# Patient Record
Sex: Male | Born: 1975 | Race: Black or African American | Hispanic: No | Smoking: Current every day smoker
Health system: Southern US, Community
[De-identification: ages and names within clinical notes are randomized; demographics above are authoritative.]

## PROBLEM LIST (undated history)

## (undated) ENCOUNTER — Telehealth

## (undated) ENCOUNTER — Ambulatory Visit

## (undated) ENCOUNTER — Encounter

## (undated) ENCOUNTER — Ambulatory Visit: Payer: Medicaid (Managed Care)

## (undated) ENCOUNTER — Ambulatory Visit: Payer: MEDICAID

## (undated) ENCOUNTER — Other Ambulatory Visit

## (undated) ENCOUNTER — Encounter: Attending: Internal Medicine | Primary: Internal Medicine

## (undated) ENCOUNTER — Ambulatory Visit
Payer: PRIVATE HEALTH INSURANCE | Attending: Student in an Organized Health Care Education/Training Program | Primary: Student in an Organized Health Care Education/Training Program

## (undated) ENCOUNTER — Telehealth
Attending: Student in an Organized Health Care Education/Training Program | Primary: Student in an Organized Health Care Education/Training Program

## (undated) ENCOUNTER — Ambulatory Visit: Payer: PRIVATE HEALTH INSURANCE

## (undated) ENCOUNTER — Encounter
Attending: Student in an Organized Health Care Education/Training Program | Primary: Student in an Organized Health Care Education/Training Program

## (undated) ENCOUNTER — Inpatient Hospital Stay: Payer: PRIVATE HEALTH INSURANCE

## (undated) ENCOUNTER — Encounter: Attending: Family | Primary: Family

## (undated) ENCOUNTER — Ambulatory Visit: Payer: MEDICAID | Attending: Plastic and Reconstructive Surgery | Primary: Plastic and Reconstructive Surgery

## (undated) ENCOUNTER — Encounter: Attending: Pharmacist | Primary: Pharmacist

## (undated) ENCOUNTER — Ambulatory Visit
Payer: PRIVATE HEALTH INSURANCE | Attending: Plastic and Reconstructive Surgery | Primary: Plastic and Reconstructive Surgery

## (undated) ENCOUNTER — Inpatient Hospital Stay

## (undated) DIAGNOSIS — K5090 Crohn's disease, unspecified, without complications: Secondary | ICD-10-CM

## (undated) HISTORY — PX: CHOLECYSTECTOMY: SHX55

---

## 2004-11-07 ENCOUNTER — Emergency Department (HOSPITAL_COMMUNITY): Admission: EM | Admit: 2004-11-07 | Discharge: 2004-11-07 | Payer: Self-pay | Admitting: Emergency Medicine

## 2004-11-08 ENCOUNTER — Inpatient Hospital Stay (HOSPITAL_COMMUNITY): Admission: EM | Admit: 2004-11-08 | Discharge: 2004-11-12 | Payer: Self-pay | Admitting: Emergency Medicine

## 2004-11-12 ENCOUNTER — Emergency Department (HOSPITAL_COMMUNITY): Admission: EM | Admit: 2004-11-12 | Discharge: 2004-11-12 | Payer: Self-pay | Admitting: Emergency Medicine

## 2004-11-13 ENCOUNTER — Inpatient Hospital Stay (HOSPITAL_COMMUNITY): Admission: AD | Admit: 2004-11-13 | Discharge: 2004-11-19 | Payer: Self-pay | Admitting: Internal Medicine

## 2004-11-15 ENCOUNTER — Ambulatory Visit: Payer: Self-pay | Admitting: Internal Medicine

## 2004-11-16 ENCOUNTER — Encounter (INDEPENDENT_AMBULATORY_CARE_PROVIDER_SITE_OTHER): Payer: Self-pay | Admitting: *Deleted

## 2005-02-01 ENCOUNTER — Emergency Department (HOSPITAL_COMMUNITY): Admission: EM | Admit: 2005-02-01 | Discharge: 2005-02-02 | Payer: Self-pay | Admitting: Emergency Medicine

## 2005-02-02 ENCOUNTER — Emergency Department (HOSPITAL_COMMUNITY): Admission: EM | Admit: 2005-02-02 | Discharge: 2005-02-02 | Payer: Self-pay | Admitting: Emergency Medicine

## 2005-02-06 ENCOUNTER — Emergency Department (HOSPITAL_COMMUNITY): Admission: EM | Admit: 2005-02-06 | Discharge: 2005-02-06 | Payer: Self-pay | Admitting: Emergency Medicine

## 2005-02-12 ENCOUNTER — Ambulatory Visit: Payer: Self-pay | Admitting: Internal Medicine

## 2005-09-08 ENCOUNTER — Inpatient Hospital Stay (HOSPITAL_COMMUNITY): Admission: EM | Admit: 2005-09-08 | Discharge: 2005-09-13 | Payer: Self-pay | Admitting: Emergency Medicine

## 2005-09-19 ENCOUNTER — Encounter: Payer: Self-pay | Admitting: *Deleted

## 2005-09-20 ENCOUNTER — Ambulatory Visit: Payer: Self-pay | Admitting: Cardiology

## 2005-09-20 ENCOUNTER — Inpatient Hospital Stay (HOSPITAL_COMMUNITY): Admission: EM | Admit: 2005-09-20 | Discharge: 2005-10-13 | Payer: Self-pay | Admitting: Internal Medicine

## 2005-09-27 ENCOUNTER — Ambulatory Visit: Payer: Self-pay | Admitting: Gastroenterology

## 2005-10-08 ENCOUNTER — Encounter: Payer: Self-pay | Admitting: Cardiology

## 2008-07-06 ENCOUNTER — Emergency Department (HOSPITAL_COMMUNITY): Admission: EM | Admit: 2008-07-06 | Discharge: 2008-07-06 | Payer: Self-pay | Admitting: Emergency Medicine

## 2008-08-26 ENCOUNTER — Emergency Department (HOSPITAL_COMMUNITY): Admission: EM | Admit: 2008-08-26 | Discharge: 2008-08-26 | Payer: Self-pay | Admitting: *Deleted

## 2008-09-20 ENCOUNTER — Emergency Department (HOSPITAL_COMMUNITY): Admission: EM | Admit: 2008-09-20 | Discharge: 2008-09-20 | Payer: Self-pay | Admitting: Emergency Medicine

## 2008-09-25 ENCOUNTER — Emergency Department (HOSPITAL_COMMUNITY): Admission: EM | Admit: 2008-09-25 | Discharge: 2008-09-25 | Payer: Self-pay | Admitting: Emergency Medicine

## 2008-10-10 ENCOUNTER — Ambulatory Visit (HOSPITAL_COMMUNITY): Admission: RE | Admit: 2008-10-10 | Discharge: 2008-10-10 | Payer: Self-pay | Admitting: Gastroenterology

## 2008-11-14 ENCOUNTER — Emergency Department (HOSPITAL_COMMUNITY): Admission: EM | Admit: 2008-11-14 | Discharge: 2008-11-14 | Payer: Self-pay | Admitting: Emergency Medicine

## 2008-11-26 ENCOUNTER — Emergency Department (HOSPITAL_COMMUNITY): Admission: EM | Admit: 2008-11-26 | Discharge: 2008-11-26 | Payer: Self-pay | Admitting: Emergency Medicine

## 2008-12-07 ENCOUNTER — Emergency Department (HOSPITAL_COMMUNITY): Admission: EM | Admit: 2008-12-07 | Discharge: 2008-12-07 | Payer: Self-pay | Admitting: Emergency Medicine

## 2008-12-22 ENCOUNTER — Emergency Department (HOSPITAL_COMMUNITY): Admission: EM | Admit: 2008-12-22 | Discharge: 2008-12-22 | Payer: Self-pay | Admitting: Emergency Medicine

## 2009-01-14 ENCOUNTER — Emergency Department (HOSPITAL_COMMUNITY): Admission: EM | Admit: 2009-01-14 | Discharge: 2009-01-14 | Payer: Self-pay | Admitting: Emergency Medicine

## 2009-03-06 ENCOUNTER — Emergency Department (HOSPITAL_COMMUNITY): Admission: EM | Admit: 2009-03-06 | Discharge: 2009-03-06 | Payer: Self-pay | Admitting: Emergency Medicine

## 2010-11-19 LAB — COMPREHENSIVE METABOLIC PANEL
ALT: 15 U/L (ref 0–53)
BUN: 8 mg/dL (ref 6–23)
Chloride: 108 mEq/L (ref 96–112)
Creatinine, Ser: 0.84 mg/dL (ref 0.4–1.5)
Glucose, Bld: 81 mg/dL (ref 70–99)
Sodium: 137 mEq/L (ref 135–145)
Total Protein: 5.6 g/dL — ABNORMAL LOW (ref 6.0–8.3)

## 2010-11-19 LAB — POCT I-STAT, CHEM 8
Creatinine, Ser: 1 mg/dL (ref 0.4–1.5)
Glucose, Bld: 74 mg/dL (ref 70–99)
HCT: 44 % (ref 39.0–52.0)
Hemoglobin: 15 g/dL (ref 13.0–17.0)
TCO2: 14 mmol/L (ref 0–100)

## 2010-11-19 LAB — DIFFERENTIAL
Basophils Absolute: 0 10*3/uL (ref 0.0–0.1)
Lymphocytes Relative: 28 % (ref 12–46)
Monocytes Absolute: 0.5 10*3/uL (ref 0.1–1.0)
Neutrophils Relative %: 64 % (ref 43–77)

## 2010-11-19 LAB — CBC
Hemoglobin: 13.8 g/dL (ref 13.0–17.0)
MCV: 83.6 fL (ref 78.0–100.0)

## 2010-11-19 LAB — CK TOTAL AND CKMB (NOT AT ARMC): Relative Index: 1.4 (ref 0.0–2.5)

## 2010-12-20 NOTE — Op Note (Signed)
NAMEROHIL, Scott Keith               ACCOUNT NO.:  [id number]   MEDICAL RECORD NO.:  [id number]          PATIENT TYPE:  INP   LOCATION:  3307                         FACILITY:  [facility]   PHYSICIAN:  Adolph Pollack, M.D.DATE OF BIRTH:  [birth year]   DATE OF PROCEDURE:  09/29/2005  DATE OF DISCHARGE:                                 OPERATIVE REPORT   PREOP DIAGNOSIS:  Right upper arm abscess.   POSTOP:  Right upper arm abscess.   PROCEDURE:  Complex I&D right upper arm abscess.   SURGEON:  Rosenbower.   ANESTHESIA:  General.   INDICATIONS:  35 year old male had a PIC line in place until it became  infected. He has significant cellulitis and by CT an abscess cavity. It is  very tender to the touch. He now presents for incision and drainage.   TECHNIQUE:  He was brought to the operating room, placed supine on the  operating room. General anesthetic was administered. The right upper  extremity had been marked by me in the holding area. The upper arm was  sterilely prepped and draped. I identified the puncture wound and enlarged  the incision.  I evacuated purulent material, sent it for culture. I then  identified an abscess cavity.  It was intramuscular and drained this and  irrigated it. There was bleeding from the skin edges. I controlled this with  the electrocautery. I then packed the abscess cavity with quarter inch  Iodoform gauze followed by a bulky dry dressing. He tolerated the procedure  well without any apparent complications and was taken to recovery room in  satisfactory condition.      Adolph Pollack, M.D.  Electronically Signed     TJR/MEDQ  D:  09/29/2005  T:  09/30/2005  Job:  [id number]

## 2010-12-20 NOTE — H&P (Signed)
Scott Keith, Scott Keith               ACCOUNT NO.:  [id number]   MEDICAL RECORD NO.:  [id number]          PATIENT TYPE:  EMS   LOCATION:  MAJO                         FACILITY:  [facility]   PHYSICIAN:  Gertha Calkin, M.D.DATE OF BIRTH:  [birth year]   DATE OF ADMISSION:  11/12/2004  DATE OF DISCHARGE:                                HISTORY & PHYSICAL   PRIMARY CARE PHYSICIAN:  Unassigned.   HISTORY OF THE PRESENT ILLNESS:  This is a 35 year old African-American male  with known Crohn;s disease status post colonic resection (see previous H&P  from November 08, 2004).  The patient returns less than 12 hours after being  discharged from [facility] for an admission regarding similar symptoms.  The patient apparently did not tolerate p.o. solids after he went home and  was only tolerating clears at the time of discharge.  He states that his  abdominal pain, nausea and other symptoms that he presented with at [facility]  did resolve when he was discharged and was stable; and, again no other  abdominal pain issues at that time.  However, he states that when he went  home he tried to have some soup and spaghetti, and the abdominal pain  returned, and was not alleviated by his p.o. pain medications; and, he did  have some nausea and a small amount of emesis.  There was no blood in the  emesis.   PAST MEDICAL HISTORY:  1.  Crohn's disease; diagnosed in 1997.  2.  Gastroesophageal reflux disease.  3.  The patient has a colonic resection with some complications back in      September 2005.  He had colonoscopy; this was done in [facility] in      November 2005.  It should be noted that his primary care physician in      [facility] was Dr. Gillie Manners, his GI physician in [facility] was Dr.      Elwyn Lade.  4.  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 Montez Morita, orthopedics, on his last admission.  At      the time of discharge it was deemed that he  could have simple      weightbearing and needed to follow up in the orthopedics office.   MEDICATIONS:  Discharge medications were:  1.  Keflex 500 mg p.o. t.i.d. times six days.  2.  Lotrimin 1% cream to be applied between the toes of his right foot on a      b.i.d. basis.  3.  Prednisone taper 20 mg to be tapered off in 1 days.  4.  TUMS 500 mg tablet three times a day.  5.  Continue his Prevacid on an outpatient basis.  6.  Vicodin tablets, prescribed 40 pills, 1 tablet q.6.   ALLERGIES:  The patient has allergies to MORPHINE  and TORADOL, BOTH CAUSE  ITCHING.   FAMILY HISTORY:  The family history is positive only for hypertension.  No  known Crohn's disease in the family.   SOCIAL HISTORY:  The  patient denies any alcohol.  He now admits he has tried  cocaine approximately six to eight months ago, but only used it for a few  months.  He does smoke approximately a half-a-pack per day for about 10-15  years and is actively smoking.  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  He is divorced and has two children, ages 36 and 59.  He states  that he has been under a lot of stress secondary to his grandmother passing  last week and also he is trying to straighten out his life since he had been  having problems with a cousin who is in [place], [place].  He  recently moved down to [place] to try to straighten up his life.   REVIEW OF SYSTEMS:  The patient does acknowledge depression, anxiety and  lots of stress with new changes in his life.  He is trying to lead a better  life, which is why he has moved down here to [place] to live with his  cousin here.  Otherwise the review of systems is negative, except per HPI.   PHYSICAL EXAMINATION:  VITAL SIGNS:  Temperature 100.3, blood pressure  124/47, pulse of 105, respirations 19, and 97% on room air.  HEENT:  The HEENT is remarkable only for dry mucous membranes, otherwise  pupils equal, round and react to  light.  Extraocular movements are intact.  Oropharynx is clear with no exudate and no erythema.  NECK:  The neck is supple without JVP.  No masses.  No bruits.  HEART:  Cardiovascular is regular rate and rhythm.  No murmurs, rubs or  gallops.  CHEST:  The chest is clear to auscultation bilaterally with good air  movement.  ABDOMEN:  The abdomen has a well healed scar, is diffusely tender, but no  rebound, no guarding, no rigidity.  Bowel sounds are present.  EXTREMITIES:  Swelling of the right foot had diminished significantly.  There is still tenderness.  Otherwise no clubbing or cyanosis.  NEUROLOGIC:  The patient is alert and oriented times three.  Cranial nerves  without deficits.  Is strength is 5/5 and symmetric.  Sensation is intact  and symmetric.  PSYCHIATRIC:  The patient admits to be depressed.  He has crying spells.   LABORATORY DATA:  White count of 9.7, hemoglobin 13.3 and platelets of  241,000.  Sodium is 138, potassium 2.8, chloride 103, bicarb 24, glucose  137, BUN 10, and creatinine 1.1.  LFTs are normal.  Lipase is 39.  UA is  negative, except for calcium oxalate crystals.  Urine drug screen is  positive only for benzos.   ASSESSMENT AND PLAN:  1.  Abdominal pain/nausea.  2.  Crohn's disease.  3.  Hypokalemia.  4.  Depression and anxiety.  5.  Tobacco abuse.  6.  History of cocaine use.  7.  Deep venous thrombosis and gastrointestinal prophylaxis.   PLAN:  1.  Admit.  2.  We will transfer from [facility] to [facility] to maintain continuity      with the physician at [facility] who just discharged him.  3.  We will provide IV pain control and antiemetics.  He had a recent CT      from his recent admission that did not show any acute colitis.  4.  At this time we will replete potassium IV as well as p.o. and check a      mag level in the morning.  5.  I offered him a nicotine patch and he is willing.  6.  Also we will provide antidepressant and anxiety  medications. 7.  We will defer GI consult to the physician at [facility] who can assess the      severity of his abdominal pain relative      to discharge status.  8.  We will also provide him with p.r.n. sleep medication since a lot of      this stress has not allowed him to obtain a good night's rest.      JD/MEDQ  D:  11/13/2004  T:  11/13/2004  Job:  [id number]   cc:   [facility]   Dr. Gillie Manners   Dr. Elwyn Lade

## 2010-12-20 NOTE — Discharge Summary (Signed)
NAMEVELDON, Scott Keith               ACCOUNT NO.:  [id number]   MEDICAL RECORD NO.:  [id number]          PATIENT TYPE:  INP   LOCATION:  6727                         FACILITY:  [facility]   PHYSICIAN:  Nelma Rothman, MD   DATE OF BIRTH:  1976-04-12   DATE OF ADMISSION:  09/20/2005  DATE OF DISCHARGE:  10/13/2005                                 DISCHARGE SUMMARY   PRIMARY CARE PHYSICIAN:  Dr. Gracelyn Nurse   DISCHARGE DIAGNOSES:  1.  Right upper extremity deep venous thrombosis, peripherally-inserted      central catheter associated.  2.  Right upper extremity methicillin-resistant Staphylococcus aureus      cellulitis/abscess.  3.  Crohn's disease flare.   Please see previously-dictated discharge summaries by Dr. Isidor Holts on  September 28, 2005, and Dr. Lonia Blood on October 06, 2005, for details of his  hospital course.   HOSPITAL COURSE:  #1 - RIGHT UPPER EXTREMITY DEEP VENOUS THROMBOSIS. This  was associated with a right upper extremity peripherally-inserted central  catheter. This has subsequently been removed. He has remained on Lovenox  while in the hospital since that time. There was an initial attempt made at  starting him on Coumadin for this, but it was very difficult to achieve a  therapeutic INR with very high doses of Coumadin. Therefore, given a history  of noncompliance in the past and difficulty in achieving a therapeutic INR,  I have elected to discharge the patient home on Lovenox injections twice  daily. This could be readdressed by his regular doctor when he sees him next  week and consider conversion to Coumadin at that time if he feels  appropriate.   #2 - METHICILLIN-RESISTANT STAPHYLOCOCCUS AUREUS RIGHT UPPER EXTREMITY  ABSCESS AND CELLULITIS. He did develop sepsis secondary to this which has  since resolved. He did require some stress-dose steroids for a short time.  He has now completed a 14-day course of IV vancomycin as well as a 7-day  course of oral  rifampin. He has done quite well with this. The swelling and  redness in his arm have decreased dramatically. He has been afebrile for  greater than 1 week.   #3 - CROHN'S DISEASE FLARE. This was his initial presenting problem when  admitted to the hospital. He was seen in consultation by Dr. Elnoria Howard who  recommended he remain on 40 mg of prednisone for approximately 1 month. He  is approximately 2 weeks into that month since he has been here in the  hospital, and so at this point I would continue 40 mg of prednisone for an  additional 2 weeks as per Dr. Haywood Pao recommendations. In that interim, the  patient states that his primary care physician was planning on referring him  to a gastroenterologist closer to home and he wishes to follow up there.  This would be appropriate, but I have reminded the patient that he needs to  stay on 40 mg of prednisone for an additional 2 weeks and then this needs to  be tapered by his physician at that time. I have reminded him that he should  not stop taking the prednisone without instruction by his doctor as this can  be life-threatening. As he plans to stay with his mother in [place],  he will need to establish care with a primary gastroenterologist there.   #4 - DISPOSITION. The patient was previously homeless. At this time he plans  to return to live with his mother in [place], [place] and will  follow up with Dr. Gracelyn Nurse at [facility]. I have stressed to him the  importance of close follow-up and that not treating his DVT or stopping the  prednisone abruptly can both be life-threatening. He understands this and  states that he will follow up with his primary care doctor next week. I have  called in his prescriptions to the [facility] in [place] where  his Medicaid is set up.   DISCHARGE MEDICATIONS:  1.  Lovenox 70 mg subcu b.i.d.  2.  Pentasa 1000 mg p.o. b.i.d.  3.  Prednisone 40 mg p.o. daily until instructed to  taper by your regular      physician.   DISCHARGE INSTRUCTIONS:  Mr. Lama was instructed that he needs to see his  regular doctor this coming week without exception. He also needs to  establish care with a regular gastroenterologist near [facility] if he  is to reside there for any prolonged period of time. He is reminded that he  needs to take all of his medications as prescribed without missing any  doses, and that missing any doses can be life-threatening.      Nelma Rothman, MD  Electronically Signed     RAR/MEDQ  D:  10/12/2005  T:  10/13/2005  Job:  [id number]   cc:   Chucky May, MD  Telephone # [phone]  Aulander Yorklyn  Please FAX copy if you can find FAX #

## 2010-12-20 NOTE — Consult Note (Signed)
Scott Keith, Scott Keith               ACCOUNT NO.:  [id number]   MEDICAL RECORD NO.:  [id number]          PATIENT TYPE:  INP   LOCATION:  3307                         FACILITY:  [facility]   PHYSICIAN:  Anselm Pancoast. Weatherly, M.D.DATE OF BIRTH:  [birth year]   DATE OF CONSULTATION:  09/29/2005  DATE OF DISCHARGE:                                   CONSULTATION   REQUESTING PHYSICIAN:  Dr. Lavera Guise   REASON FOR CONSULTATION:  Cellulitis and DVT in the right upper extremity,  rule out lymphadenitis.   HISTORY OF PRESENT ILLNESS:  Mr. Witczak is a 35 year old male patient  admitted on September 19, 2005 for Crohn's flare.  He required a PICC line  for IV access, has been on IV steroids for treatment of Crohn's.  The PICC  line was inserted on September 22, 2005.  On September 27, 2005 patient  inadvertently pulled on the PICC line and subsequently developed swelling  and redness in that area.  Venous Dopplers later showed a DVT.  The PICC  line was discontinued on September 27, 2005.  Patient has subsequently  developed increased swelling, erythema, and significant pain in the arm.  He  is unable to move his arm due to the swelling and the pain.  He has  developed tiny areas of purulent drainage and superficial vesicular  formation over the upper part of the affected limb where the PICC line was  in place in the right upper extremity.  His fever has spiked as high as 104  degrees.  He has been on IV vancomycin, but at this point has no IV access.  The PICC line is planned for insertion later today.  CT of the right upper  extremity has also been ordered to rule out any loculated abscess and to  clarify if there is any lymphadenitis.   REVIEW OF SYSTEMS:  As per history of present illness.   FAMILY HISTORY:  Noncontributory.   SOCIAL HISTORY:  He is currently homeless.  He has family in [facility] and  plans to return there after discharge.  He denies using tobacco at times and  other times admits to  smoking less than a pack of cigarettes a day.  He has  also been positive for marijuana on previous admissions.  Unclear if he  drinks social alcohol.  Patient denies.   PAST MEDICAL HISTORY:  1.  Crohn's disease x3 years.  2.  History of multiple recurrent DVTs.  He has had these in the subclavian      vein and the bilateral iliac veins and is apparently on chronic Lovenox      at home.  3.  GERD.  4.  Right foot cellulitis in 2006.  5.  Medication noncompliance.   PAST SURGICAL HISTORY:  1.  Open cholecystectomy.  2.  Colectomy due to Crohn's issues in 2006.  He was also left with an open      wound that took several months to heal.   ALLERGIES:  MORPHINE and TORADOL.   CURRENT MEDICATIONS:  Nicotine patch, Pentasa, Protonix, prednisone, Lovenox  q.12h., Avelox, Dilaudid  IV, vancomycin IV, Zosyn IV, multiple p.r.n.  medications.   PHYSICAL EXAMINATION:  GENERAL:  Pleasant male somewhat toxic-appearing  complaining of significant right upper extremity pain.  VITAL SIGNS:  Temperature 103, blood pressure 110/61, pulse 138,  respirations 18.  HEENT:  Sclerae are mildly injected.  NECK:  Supple.  No adenopathy.  NEUROLOGIC:  Patient is alert and oriented x3, moving all extremities x4.  CHEST:  Lung sounds are clear bilaterally.  He is on room air.  CARDIAC:  S1, S2.  He is tachycardic.  Rates up into the 130s.  No JVD.  ABDOMEN:  Soft, nontender, nondistended without hepatosplenomegaly, masses,  or bruits.  He has a large scar in the mid abdomen, crater-like with the  umbilicus post surgical deviated to the left portion of abdomen.  He also  has a large linear scar in the right upper quadrant.  EXTREMITIES:  The right upper extremity has 3+ edema, is exquisitely tender  to touch.  The edema extends from the shoulder all the way down towards the  wrist area.  Distal pulses intact.  There is erythema present down through  the forearm.  There are several superficial vesicular areas  with clear  fluid.  There are several pin point areas of purulent fluid.  Areas so  tender unable to determine if I can express fluid out of these due to pain  issues.   LABORATORY AND DIAGNOSTICS:  White count 13,800, hematocrit 14.9, platelets  253,000.  Blood cultures are pending.  Sodium 140, potassium 3.8, glucose  118, CO2 23, BUN 8, creatinine 0.8.   Diagnostics:  Chest x-ray was done today, shows soft tissue swelling in the  right axilla and right upper extremity, otherwise a normal chest x-ray.   IMPRESSION:  1.  Cellulitis and possible lymphadenitis in the right upper extremity with      significant edema, rule out impending compartment syndrome.  2.  Fever and sepsis.  3.  Tachycardia secondary to sepsis.  4.  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  5.  History of Crohn's with recent flare, currently compensated.      Immunosuppressed on prednisone.   PLAN:  1.  Continue vancomycin IV at this time along with Zosyn.  Consider changing      Zosyn to Tygacil, follow up on wound and blood cultures.  2.  Hold Lovenox at the present time in anticipation of potential OR.  Will      need to follow up on the CT scan.  There is some concern that patient      may need fasciotomy if symptoms do not improve within the next 24-48      hours.  3.  Internal jugular IV access was inserted emergently per Dr. Zachery Dakins at      the bedside to begin fluid resuscitation due to patient's febrile state      and tachycardia and need to continue IV antibiotics as soon as      possible.  No peripheral access available.  4.  Will go ahead and put the patient on PCA Dilaudid for pain and we will      reevaluate other symptoms in the morning.      Allison L. Rennis Harding, N.P.    ______________________________  Anselm Pancoast. Zachery Dakins, M.D.    ALE/MEDQ  D:  09/30/2005  T:  09/30/2005  Job:  [id number]

## 2010-12-20 NOTE — Consult Note (Signed)
NAMESCOTTIE, Scott Keith               ACCOUNT NO.:  [id number]   MEDICAL RECORD NO.:  [id number]          PATIENT TYPE:  INP   LOCATION:  0470                         [facility]AC[facility]   PHYSICIAN:  Myrtie Neither, MD      DATE OF BIRTH:  [birth year]   DATE OF CONSULTATION:  11/11/2004  DATE OF DISCHARGE:                                   CONSULTATION   REFERRING PHYSICIAN:  [facility], Dr. Read Drivers.   REASON FOR CONSULTATION:  Multiple stress fractures, right foot.   HISTORY OF PRESENT ILLNESS:  This is a 35 year old male admitted for Crohn's  disease with long term history of Crohn's and use of prednisone.  The  patient had right foot surgery done by podiatrist for plantar callosities  underneath the metatarsal heads two months ago.  The patient has not been  back for his follow-up.  The patient has had pain and swelling involving the  right foot recently over the past two weeks.  The patient denied any fever,  chills, or night sweats.  The patient points to mid foot area as the  majority of the pain.  No recent trauma.   PAST MEDICAL HISTORY:  1.  Crohn's disease.  2.  Gastroesophageal reflux disease.  3.  The patient had history of colon resection in September 2005 and      November 2005.   MEDICATIONS:  1.  Prednisone 20 mg daily.  2.  Prevacid 40 mg daily.   FAMILY HISTORY:  High blood pressure.   SOCIAL HISTORY:  The patient smokes half a pack of cigarettes, denies use of  alcohol or illegal drugs.   REVIEW OF SYSTEMS:  The patient states he has had recent symptoms from his  Crohn's disease and GI discomfort over the past few days.  No cardiac or  respiratory symptoms.   PHYSICAL EXAMINATION:  VITAL SIGNS:  Temperature 98.5, blood pressure  115/72, pulse 88, respirations 20.  EXTREMITIES:  Left foot, post surgical scar, dorsal aspect at the base of  both first and fifth metatarsal heads.  Also ulcers and scars over the PIP  joints of the second, third, and fourth  toes.  Soft tissue swelling and  thickness around the mid foot, tenderness in mid foot and metatarsal area.  Pain on dorsiflexion of the foot as well as inversion.  No plantar  callosities about the first and fifth metatarsal heads.  No increased warmth  or redness, no discoloration other than ulcers or scars.  Pulses are intact.  Plain x-rays revealed old surgical sites with resection of phalangeal heads  of the second, third, and fourth toes.  Some osteopenic bone.  MRI  demonstrates stress fractures involving at the base of first, third, and  fourth metatarsals as well as second metatarsal.  There are also stress  fractures involving distal cuboid and distal aspect of the cuneiforms.  Fractures are nondisplaced.   IMPRESSION:  1.  Osteopenic bone, left foot, with multiple stress fractures of the      metatarsals, cuneiform cuboid bones of the left foot.  The patient's  recent laboratory does demonstrate elevated blood sugar of 204, 147.      Calcium is low at 8.3.  CBC demonstrates white cell count of 12.2, H&H      12.2 and 36.8, differential 92% neutrophils.  2.  Multiple stress fractures, osteopenic bone, rule out secondary to      steroid use, rule out parathyroid disorder.  Presently does not appear      to be an infectious process.   RECOMMENDATIONS:  Sedimentation rate, calcium supplement.  Cam walker with  partial weight bearing on the right side with the use of crutches.  Treatment for diabetes mellitus.  Will follow the patient after discharge in  the office in two week period.      AC/MEDQ  D:  11/11/2004  T:  11/11/2004  Job:  [id number]

## 2010-12-20 NOTE — H&P (Signed)
Scott Keith, Scott Keith               ACCOUNT NO.:  [id number]   MEDICAL RECORD NO.:  [id number]          PATIENT TYPE:  INP   LOCATION:  5705                         FACILITY:  [facility]   PHYSICIAN:  Lonia Blood, M.D.      DATE OF BIRTH:  02-20-1976   DATE OF ADMISSION:  09/08/2005  DATE OF DISCHARGE:                                HISTORY & PHYSICAL   PRIMARY CARE PHYSICIAN:  The patient is unassigned. His primary care doctor  is Dr. Gillie Manners at [facility], [place] [place].   PRESENTING COMPLAINT:  Intractable nausea and vomiting.   HISTORY OF PRESENT ILLNESS:  The patient is a 35 year old guy who primarily  lives in [place] but comes to [place] on and off. The patient  apparently came down 3 days ago from [place]. He has been staying at the  shelter here. He says he has been doing fine and taking all his medications  regularly until this morning, when he started feeling sick, nauseated, and  then vomited. That continued throughout the day and was unable to keep  anything down. Hence, he came to the emergency room. He also complains of  some mild abdominal pain. 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 He was noncompliant with Coumadin;  hence, he was placed on the Lovenox daily. 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 He  was subsequently being admitted for further management and treatment.   PAST MEDICAL HISTORY:  Significant for Crohn's disease status post colectomy  in 2006. The patient also had osteopenic fracture, medication noncompliance.  History of subclavian vein thrombosis. The patient has also GERD. The  patient has history of right foot cellulitis back in June 2006.   CURRENT MEDICATIONS  PER PATIENT:  1.  Pentasa 250 mg t.i.d.  2.  Prevacid 30 mg daily.  3.  Lovenox 30 mg subcutaneous daily.   ALLERGIES:  The patient is allergic to MORPHINE SULFATE and TORADOL.   SOCIAL HISTORY:  The patient currently lives here in the shelter. His  parents live close to [facility]. He shuttles between [place] and  [place]. He is unemployed. Denied any tobacco or alcohol use at the  moment. The patient last here admitted to using some cocaine at the time.  Also admitted to smoking about a half a pack per day for 10-15 years. 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 The patient has been  married before but divorced. He had two children that are currently age 48  and 29. He has been under a lot of stress recently. Most of his family live  around [facility].   FAMILY HISTORY:  Significant for hypertension. The patient denied any  history of Crohn's disease or any abdominal malignancy in his family.   REVIEW OF SYSTEMS:  Ten-point review of systems  was performed and they are  essentially per HPI.   PHYSICAL EXAMINATION:  VITAL SIGNS:  Temperature of 98.9, blood pressure  112/72, pulse 96, respiratory rate 16, saturations 98% room air.  GENERAL:  He is alert and oriented, in no acute distress.  HEENT:  PERRL, EOMI.  NECK:  Supple. No JVD, no lymphadenopathy.  RESPIRATORY:  The patient has good air entry bilaterally. No wheezes or  rales.  CARDIOVASCULAR:  He has regular rate and rhythm.  ABDOMEN:  Soft with multiple scars, what appeared to be an abdominal wall  hernia. It is nontender abdomen with positive bowel sounds.  EXTREMITIES:  Show no edema, cyanosis, or clubbing.   LABORATORY DATA:  Showed a white count of 5.9, hemoglobin is 12.1, platelet  count 285, with MCV of 75.8. Sodium 140, potassium 3.6, chloride 112, CO2  24, glucose 87, BUN 6, creatinine 0.9, his calcium is 8.4, total protein  5.9, albumin 3.0, AST 11, ALT less than 8, alkaline phosphatase  115, total  bilirubin 0.4, amylase 74, lipase 27. His UA shows small leukocyte esterase,  wbc's 3-6, rare bacteria. His PT is 13.0, INR 1.0, PTT 33. He has a CT  abdomen and pelvis that showed thrombus in the bilateral iliac veins and  lower IVC with extensive network of venous collaterals indicating at least  some degree of chronicity; however, the findings were new since April 2006.  There are also no other findings of significance in the abdomen. In the  pelvis there are some venous collaterals in the soft tissues. Also bilateral  iliac and common femoral venous thrombus. There appears to be a tiny air  bubble in the right common femoral vein in an area where there is thrombus.  It is questionable whether this could be infected but clinically there is no  correlation. There is a recommendation for the need for venous Doppler.   ASSESSMENT:  This is a 35 year old extremely noncompliant patient who  currently lives in the shelter here in [place] coming in with intractable  nausea/vomiting. The differentials are vast. Could be drug related,  especially with history of polysubstance abuse and living in the shelter,  that could be a big possibility. It could also be secondary to his Crohn's  which could be flaring, although the CT abdomen did not indicate anything  suggestive of Crohn's flare. The patient has gotten better with some  Phenergan in the ED and is ready to eat. The patient could also be  malingering, having been the fact that he is living in the shelter and  probably not able to get adequate food.   PLAN:  1.  Nausea and vomiting. Will admit the patient and treat him      symptomatically. Will continue his medications for Crohn's and also put      him back on prednisone to taper down. I will give Phenergan plus or      minus Zofran, depending on his symptoms, to control his nausea and     vomiting. Also, hydrate him gently. Once the patient's nausea and      vomiting has  completely disappeared, will advance his diet to regular.      Once he takes regularly will schedule him to follow up with the      gastroenterologist - in this case, [facility] whom he saw      the last time.  2.  History of Crohn's. Again, I am not sure the patient is having any flare  at this point. There is nothing to indicate that. However, I will      continue with his Pentasa and also put him on prednisone. I will put him      on low dose since there is no evidence that he is having an acute flare.  3.  Bilateral iliac vein thrombosis. Will get venous Doppler on the patient.      In the meantime, however, I will start him on full anticoagulation. I      understand the patient was not compliant with Coumadin; hence, he has      been on Lovenox. As such, I will not start Coumadin right now. The      patient may have had this thrombosis at the same      time he had the subclavian one from the look of things. He may just need      to continue on Lovenox as he has taken before prior to discharge.  4.  GERD. I will continue his PPI that the patient was taking prior to      admission.      Lonia Blood, M.D.  Electronically Signed     LG/MEDQ  D:  09/08/2005  T:  09/09/2005  Job:  [id number]

## 2010-12-20 NOTE — Discharge Summary (Signed)
[name], Scott Keith               ACCOUNT NO.:  [id number]   MEDICAL RECORD NO.:  [id number]          PATIENT TYPE:  INP   LOCATION:  0470                         FACILITY:  [facility]   PHYSICIAN:  Isidor Holts, M.D.  DATE OF BIRTH:  Dec 24, 1975   DATE OF PROCEDURE:  DATE OF DISCHARGE:  11/12/2004                      STAT - MUST CHANGE TO CORRECT WORK TYPE   DISCHARGE SUMMARY   PMD:  Dr. Gillie Manners, [facility].  Otherwise unassigned.   GASTROENTEROLOGIST:  Dr. Elwyn Lade, [facility]   DISCHARGE DIAGNOSES:  1.  Acute gastritis.  2.  Cellulitis right foot.  3.  Osteoporotic stress fractures right foot.  4.  History of Crohn's disease.  5.  Gastroesophageal reflux disease.  6.  Tinea interdigitalis.   DISCHARGE MEDICATIONS:  1.  Keflex 500 mg p.o. q.i.d. for six days.  2.  Lotrimin 1% cream topically between toes of right foot b.i.d.  3.  Prednisone taper, i.e., 20 mg p.o. daily for one week, then 10 mg p.o.      daily for three days, then 5 mg p.o. daily for three days, then 2.5 mg      p.o. for three days, then stop.  4.  Tums 500 mg p.o. t.i.d. (over the counter).  5.  Continue pre-admission Prevacid.  6.  Vicodin (5/500) one p.o. p.r.n. q.6 h. for pain, 40 pills dispensed.   PROCEDURES:  1.  Abdominal/pelvic CT scan dated November 07, 2004.  This showed postsurgical      change involving the abdomen, but no evidence for acute inflammatory      process such as recurrent Crohn's disease.  Incidental finding of      multiple large gallstones in the gallbladder.  No acute pelvic findings.      No inflammatory bowel disease is demonstrated.  2.  X-ray right foot, dated November 12, 2004.  This showed osteoporosis.      Prior surgery.  Extensive soft tissue swelling, without acute bony      abnormality.  3.  MRI right foot, dated November 10, 2004.  This showed stress fractures of      bases of the first through fourth metatarsals.  No displacement.      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   CONSULTATIONS:  Dr. Myrtie Neither, orthopedic surgeon.   ADMISSION HISTORY:  As in H&P notes of November 08, 2004.  However, in brief,  this is a 35 year old, African-American male, with known history of Crohn's  disease diagnosed in 1997, status post colonic resection September 2005,  status post recent colonoscopy November 2005, treated intermittently with  steroids, tapering course, during acute exacerbations.  The patient has been  on prednisone 20 mg p.o. daily for a few weeks now.  Presenting on this  occasion with a one-day history of diffuse abdominal pain, nausea, and  vomiting.  Apparently had arrived at [facility] from [facility] to move in  with his cousin.  Denied diarrhea, hematemesis, or melenic stools.  In  addition, has a three-day history of right foot swelling, tenderness, and  pain.  The patient was admitted for further evaluation, investigation,  and  management.   CLINICAL COURSE:  1.  Acute gastritis.  The patient presents with a one-day history of diffuse      abdominal pain, nausea, and vomiting.  Denies any culprit foods.  Denies      any clustering of cases, or febrile illness.  Abdominal CT scan dated      November 07, 2004 showed no evidence of active Crohn's disease, although      there were incidental findings of multiple gallstones.  The patient was      managed with intravenous fluid hydration, antiemetics, proton pump      inhibitors, and analgesics, with good clinical response.  He was kept      n.p.o. initially.  Diet was broadened to clear fluids, then general      fluid diet, then subsequently broadened to regular diet.  The patient      was able to in the main, tolerate this, although he admitted to      occasional vomiting and nausea.  It is doubtful exactly how severe the      patient's episodes of nausea and vomiting were, since through the course      of his hospital stay, he on several occasions was found in the  cafeteria      helping himself to snacks, etc., in clear violation of prescribed      dietary regimen.   1.  Cellulitis right foot.  The patient presented with a three-day history      of right foot swelling and tenderness.  Physical examination revealed      increased local temperature and extreme tenderness, as well as some      duskiness.  He was commenced on antibiotics, initially intravenous      Ancef.  Subsequently switched to p.o. Keflex, with good clinical      response, resulting in diminution of swelling and local temperature.  X-      ray of the affected foot on November 08, 2004 showed osteoporosis, but no      acute bony changes.  Subsequent MRI of the affected foot on November 10, 2004 showed stress fractures affecting the bases of the first through      fourth digits which were deemed to be osteoporotic in nature.  The      patient is known to be on long-term intermittent steroid treatment.  It      is clear that this is the culprit.  Orthopedic consultation was called.      It was kindly provided by Dr. Myrtie Neither.  The patient now has a cast      and has been commenced on analgesics orally for this.  He has been      advised to only partially weight bear and was supplied with crutches.      He is scheduled to follow up with Dr. Montez Morita on an outpatient basis.  In      the meantime, he has also been commenced on Tums 500 mg p.o. t.i.d. for      his osteoporosis, and he is being tapered off steroids.   1.  Tinea interdigitalis.  This was an incidental finding on physical      examination which was felt to have been contributory to the patient's      right foot cellulitis.  He is being managed with topical antifungal      medication.   1.  History of Crohn's disease.  Does not appear in acute exacerbation,      during this admission, however, it is felt that in the future it will     serve him well to establish care with a gastroenterologist in this      locality for  follow-up.   DISPOSITION:  The patient is discharged in satisfactory condition on November 12, 2004.   PAIN MANAGEMENT:  As in discharge medications.   ACTIVITY:  Partial weight-bearing.   DIET:  No restrictions.   WOUND CARE:  Not applicable.   SPECIAL INSTRUCTIONS:  The patient is to follow up with Dr. Myrtie Neither in  orthopedics in two weeks.  He has been supplied with the telephone number,  [phone].  Also, he is to establish PMD with [facility], telephone  number has been supplied, and has been instructed to do so within one week.  All this has been communicated to the patient, and he has verbalized  understanding.      CO/MEDQ  D:  11/13/2004  T:  11/13/2004  Job:  [id number]   cc:   Myrtie Neither, MD  [address] [place]  [place] [address]  Fax: [phone]   Arvid Right, M.D.  [facility]

## 2010-12-20 NOTE — Discharge Summary (Signed)
NAMEWILFRIDO, Scott Keith               ACCOUNT NO.:  [id number]   MEDICAL RECORD NO.:  [id number]          PATIENT TYPE:  INP   LOCATION:  6727                         FACILITY:  [facility]   PHYSICIAN:  Lonia Blood, M.D.       DATE OF BIRTH:  1975-12-22   DATE OF ADMISSION:  09/20/2005  DATE OF DISCHARGE:                                 DISCHARGE SUMMARY   INTERIM DISCHARGE SUMMARY:   DIAGNOSES:  For a complete list of diagnoses, please refer to the previously  dictated discharge summary done on September 28, 2005. Please add to those  the following:  1.  Right arm complex methicillin-resistant Staphylococcus aureus abscess      with septic phlebitis and methicillin-resistant Staphylococcus aureus      bacteremia.  2.  Sepsis, resolved.  3.  Relative adrenal insufficiency, resolving.  4.  Hypokalemia, resolved.  5.  Acute right internal jugular and subclavian deep venous thromboses.  6.  Iron deficiency anemia.  7.  Crohn disease.   Medications will be dictated at the time of the actual discharge of the  patient.   CONSULTATION:  September 29, 2005 Anselm Pancoast. Zachery Dakins, M.D. from [facility] had seen the patient in consultation.   PROCEDURES:  1.  September 29, 2005 - Mr. Franko underwent a right upper arm abscess      incision and drainage by Dr. . Rosenbower.  2.  September 29, 2005 - The patient had a computed tomographic scan of right      upper extremity showing an abscess cavity in the right upper extremity.  3.  September 29, 2005 - The patient underwent an ultrasound-guided left      upper extremity double lumen peripheral intravenous catheter placement      by Dr. Bonnielee Haff. The patient had a 27 cm double lumen #6 [place] power      injectable intravenous catheter with the tip in the left axillary vein.   HOSPITAL COURSE:  For hospital course covering the period from September 20, 2005, until September 28, 2005, refer to the dictated discharge summary done  by Dr.  Brien Few.   For hospital course from September 29, 2005, until October 05, 2005:  Problem #1: Sepsis from a right upper extremity abscess with septic  phlebitis and MRSA bacteremia. Mr. Vetsch was transferred to the step-down  unit. He had aggressive intravenous fluid hydration, empiric antibiotics  with clindamycin, vancomycin and stress dose steroids. He also underwent  incision and drainage of the right upper quadrant abscess. He had been  making a steady recovery throughout this period of time. At the time of my  dictation, he had 1 more week of intravenous antibiotics to complete and 4  more days of oral rifampin. The plan was to continue anticoagulation with  Lovenox and to switch him to chronic Coumadin for at least 4 months.  Throughout this period of time, Mr. Morelos' febrile curve and leukocytosis  resolved.  Problem #2: Relative adrenal insufficiency secondary to this patient being  on chronic steroids for his Crohn's and having an acute infectious  process.  Mr. Dingley responded well with stress dose steroids. He was tapered down at  the time of my dictation. His doses of hydrocortisone were 25 mg  intravenously twice a day.  Problem #3: Crohn disease: The patient was initially admitted with a flare  of Crohn disease and was seen in consultation by the gastroenterology  service. The patient was advised to take mesalamine on a daily basis, and  also with stress dose steroids he was receiving for the infection, his  Crohn's remained pretty quiescent during this period of hospitalization.  Problem #4: Microcytic anemia, mostly secondary to iron deficiency from  chronic Crohn disease. The patient cannot take any oral iron right now due  to his acute infectious process, but this should be undertaken at the time  of the actual discharge and resolution of the infectious process.  Problem #5: History of DVT. Mr. Spong seemed to be at a very high risk for  sustaining deep venous thrombosis. 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 At this point in time, I will  advocate anticoagulation until he is seen by his primary care physicians in  [place], [place].      Lonia Blood, M.D.  Electronically Signed     SL/MEDQ  D:  10/06/2005  T:  10/06/2005  Job:  [id number]

## 2010-12-20 NOTE — H&P (Signed)
Scott Keith, Scott Keith               ACCOUNT NO.:  [id number]   MEDICAL RECORD NO.:  [id number]          PATIENT TYPE:  INP   LOCATION:  0104                         FACILITY:  [facility]   PHYSICIAN:  Lonia Blood, M.D.      DATE OF BIRTH:  12/22/1975   DATE OF ADMISSION:  09/19/2005  DATE OF DISCHARGE:                                HISTORY & PHYSICAL   PRIMARY CARE PHYSICIAN:  Unassigned.  He sees Dr. Gillie Manners at [facility] but  goes back and forth between [place] and [place].   PRESENTING COMPLAINT:  Severe abdominal pain.   HISTORY OF PRESENT ILLNESS:  The patient is a 35 year old African American  man with history of Crohn's disease status post colectomy in 2006.  The  patient was just discharged from the hospital six days ago after admission  with intractable nausea, vomiting as well as bilateral iliac vein  thrombosis.  The patient was presumed to have a Crohn's flare at the time  although CT scan of the abdomen did not indicate that.  He went back to the  homeless shelter here in [place] where he has been staying until this  afternoon around 1:30 p.m. when he started experiencing severe abdominal  pain.  The patient said he could not tolerate it and there was associated  nausea but not really vomiting and no diarrhea.  He has not been  constipated.  He denied taking any new medications.  The patient claimed  that he is taking his medicines at the shelter as prescribed.  He has a  longstanding history of non-compliance.  Here in the ED he seemed to be in  minimal distress secondary to pain.  He denied any fever.  He denied any  urinary symptoms.   PAST MEDICAL HISTORY:  His past medical history includes:  1.  Crohn's disease, status post colectomy in 2006.  2.  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 He is on chronic Lovenox.  3.  GERD.  4.  History of right foot cellulitis in June 2006.  5.  History of  marijuana and tobacco use.  6.  Medication non-compliance.  7.  Homelessness.   ALLERGIES:  The patient is allergic to MORPHINE SULFATE and TORADOL.   SOCIAL HISTORY:  He currently lives in the homeless shelter in [place].  His parents live close to [facility].  The patient shuttles between  [place], [place] and [place].  He is unemployed.  He denies  current tobacco or alcohol use.  He was found be marijuana positive last  admission two weeks ago.  The patient still smokes about half a pack a day  apparently, even though he denied any tobacco use on prior admissions.  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  He has been  married before but not divorced.  The patient had two children, age 59 and 67.  He has been under a lot of stress recently.  His family currently live in  [place].  FAMILY HISTORY:  Significant for hypertension.  No history of Crohn's  disease or any abdominal malignancy or chronic disease in his family.   REVIEW OF SYSTEMS:  Ten-point review of systems is essentially as in HPI.   PHYSICAL EXAMINATION:  VITAL SIGNS:  The patient is afebrile with a  temperature of 98.7, blood pressure 111/73, pulse 100, respiratory rate 16.  Saturation 95% on room air.  GENERAL:  The patient seemed to be in acute distress secondary to pain.  He  is writhing, holding onto his abdomen.  HEENT:  Pupils are equal, round and reactive to light.  Extraocular muscles  were intact.  NECK:  Supple.  No JVD, no lymphadenopathy.  RESPIRATORY:  He has good air entry bilaterally.  No wheezes or rales.  CARDIOVASCULAR:  The patient is mildly tachycardic.  ABDOMEN:  Full, but diffusely tender.  No guarding.  He has positive bowel  sounds.  EXTREMITIES:  No edema, cyanosis or clubbing.   LABORATORY DATA:  Initial labs showed a white count of 7, hemoglobin 12.7  with an MCV of 76.9.  Platelet count is 93 with a normal differential.  Sodium 134,  potassium 4.3, chloride 105, CO2 24, glucose 92, BUN 9,  creatinine 0.9, calcium 8.5, total protein 6.3, albumin 3.1.  AST 14, ALT  11. Alk phos 106.  Total bilirubin 0.5.  His urinalysis shows small  leukocyte esterase.  Urine wbc's 3-6, and few bacteria.   ASSESSMENT:  This is a 35 year old gentleman with known Crohn's disease,  presenting with severe abdominal pain.  The patient is currently writhing in  pain.  His CT scan shows hyperemia and wall thickening at distal ileum,  likely active disease.  Upper thickening at hepatic flexure which could be  underdistention, but stricture could not be excluded.  Ileocolic mesenteric  adenopathy which could be reactive.  No definite abdominal venous thrombosis  at this point but questionable central thoracic venous insufficiency with  secondary abdominal collaterals.  This CT scan is changed from his previous  CT scan about 10 days ago indicating that the patient may be having an acute  Crohn's flare at this point.  The patient has had history of non-compliance  and may have been non-compliant with this medication, especially living in  the homeless shelter, I doubt if he was able to fill out all his medicines.  Other possibilities could be the patient has used some drugs and is getting  some ischemic bowel or is getting flare from use of drugs.   PLAN:  1.  Abdominal pain.  Based on the CT findings, I will treat the patient as      if he is having a Crohn's flare.  I will put him on some IV steroids,      later taper him down to oral steroids.  Put him back on the Pentasa.  I      will put him on some Flagyl to help with acute flare of the Crohn's even      though his white count seems to be down and no fever.  2.  I will also treat some other possible causes like urinary tract      infection, especially looking at his leukocyte esterase and urine wbc's      as well as bacteria.  I will pursue with urine culture, then the urine     culture is  pending, I will put the patient on ciprofloxacin for three  days.  3.  History of venous thrombosis.  The patient is currently on Lovenox      apparently due to his inability to be compliant.  I will put him back on      his Lovenox to be dosed by pharmacy.  4.  Gastroesophageal reflux disease.  I will put the patient on some      Protonix, initially IV to be converted to p.o.  5.  Thrombocytopenia.  This is a new finding and could be related to the      chronic use of the Lovenox.  I will, however, continue to follow on the      patient and see how he does.  If his platelets continue to drop, we may      have to switch him from the Lovenox to something else for      anticoagulation.     Lonia Blood, M.D.  Electronically Signed    LG/MEDQ  D:  09/20/2005  T:  09/20/2005  Job:  [id number]

## 2010-12-20 NOTE — Discharge Summary (Signed)
Scott Keith, Scott Keith               ACCOUNT NO.:  [id number]   MEDICAL RECORD NO.:  [id number]          PATIENT TYPE:  INP   LOCATION:  6710                         FACILITY:  [facility]   PHYSICIAN:  Isidor Holts, M.D.  DATE OF BIRTH:  Oct 29, 1975   DATE OF ADMISSION:  09/20/2005  DATE OF DISCHARGE:                                 DISCHARGE SUMMARY   INTERIM DISCHARGE SUMMARY:   DISCHARGE DIAGNOSES:  1.  History of Crohn's disease, status post colectomy 2007.  2.  Flare up of Crohn's disease.  3.  Gastroesophageal reflux disease.  4.  History of smoking/Marijuana use.  5.  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  6.  New right internal jugular vein and right subclavian vein thrombosis,      confirmed by venous Doppler September 28, 2005.  7.  Cellulitis right upper arm.   DISCHARGE MEDICATIONS:  To be listed in addendum at the time of actual  discharge by discharging physician.   PROCEDURES:  1.  Chest x-ray dated September 18, 2005.  This showed postoperative changes      in the right abdomen but no evidence of obstruction or free air.  2.  Abdominal and pelvic CT scan dated September 19, 2005.  This showed      postsurgical changes in the right abdomen with a loop of distal ileum      just proximal to the surgical site demonstrating hyperemia and wall      thickening likely related to active Crohn's disease.  Ileocolic      mesenteric adenopathy likely reactive.  An area of apparent soft tissue      thickening in ascending colon could be due to under distention, a      stricture, or even mass in this area cannot be excluded, although the      absence of significant abnormality on the prior CT of one week ago      argues against this.  No IVC thrombus identified.  No acute findings in      the pelvis.  Pelvic veins enhanced normally.  3.  Portable chest x-ray dated September 22, 2005.  This showed suboptimal      position  of the PICC line.  4.  Venous Doppler right upper extremity dated September 24, 2005.  This      showed evidence of acute DVT in the right internal jugular vein and      subclavian vein.  The vein and these vessels are patent.  No evidence of      DVT in the left internal jugular vein.   CONSULTATIONS:  1.  Jordan Hawks Elnoria Howard, M.D., gastroenterologist.  2.  Iva Boop, M.D., gastroenterologist.   ADMISSION HISTORY:  As in H&P notes of September 19, 2005, dictated by Dr.  Lonia Blood.  However, in brief, this is a 35 year old male, with known  history of Crohn's disease, status post colectomy in 2006, also prior  history of multiple venous thromboses including subclavian vein and  bilateral iliac veins, currently  on chronic Lovenox treatment, GERD, history  of marijuana and tobacco use, medication noncompliance.  He had been  admitted to [facility] from September 08, 2005, to September 13, 2005,  with intractable nausea and vomiting, thought to be consistent with flare up  of Crohn's disease.  He was subsequently discharged in satisfactory  condition, but represents with persistent vomiting and severe abdominal  pain.  He was admitted for evaluation, investigation, and management.   CLINICAL COURSE:  #1.  CROHN'S DISEASE WITH ACUTE FLARE UP:  The patient presents, 7 days  following discharge from hospital, for flare up of Crohn's disease, with  persistent nausea, vomiting, and abdominal pain. Abdominal CT scan showed  post surgical changes in the right abdomen, with loop of distal ileum just  proximal to the surgical site demonstrating hyperemia and wall thickening,  likely related to active Crohn's disease.  There was also reactive right  ileocolic mesenteric adenopathy.  For further details of abdominal and  pelvic CT scan, refer to Procedures list above.  However, these findings are  consistent with flare up of Crohn's disease. The patient was admitted for  bowel rest, started  on intravenous hydration, steroids, antiemetics,  analgesics, proton pump inhibitors, Asacol, and also empirically commenced  on combination of ciprofloxacin and metronidazole.  Gastroenterology  consultation was called, which was kindly provided by Dr. Jordan Hawks. Hung  and Iva Boop.  Their input in the management of this patient, has  been quite invaluable. Subsequently, per gastroenterology recommendations,  he was switched to oral steroids. Asacol dose was increased to 1000 mg p.o.  twice daily. The patient did well on this management regimen.  We were able  to gradually advance his oral intake from n.p.o. to clear fluids to regular  diet, and by September 27, 2005, the patient was eating without any problems  whatsoever, though he continued to complain of some abdominal pain.  Per GI  recommendations, Flagyl and ciprofloxacin were discontinued on September 25, 2005.   #2.  SMOKING HISTORY/MARIJUANA USE: The patient has been counseled  appropriately and is currently being managed with Nicoderm CQ patch.   #3.  GASTROESOPHAGEAL REFLUX DISEASE:  This is managed with proton pump  inhibitor treatment and he remained asymptomatic from this viewpoint.   #4.  HISTORY OF VENOUS THROMBOEMBOLIC DISEASE: The patient carries a  diagnosis of recurrent venous thromboses, including bilateral iliac vein and  subclavian vein thrombosis, and has been on chronic anticoagulation with  Lovenox.  Interestingly, abdominal and pelvic CT scan of September 19, 2005,  showed no thrombus and also pelvic veins enhanced normally.  Due to poor  venous access, the patient had a right subclavian PICC inserted soon after  admission.  However, he inadvertently pulled on this, on September 27, 2005,  and subsequently complained of right upper extremity swelling.  Physical  examination revealed markedly swollen right upper arm which was tender and  somewhat tender to touch.  As it was not immediately clear if this  was a hematoma, against a background of anticoagulation, or a new DVT, venous  doppler was ordered, and Lovenox was held temporarily.  The patient  underwent right upper extremity venous doppler on September 28, 2005, which  confirmed a new or acute DVT right subclavian vein and right internal  jugular vein.  As a matter of fact, the left internal jugular vein was  patent.  He has been recommenced on his anticoagulation.   #5.  RIGHT UPPER EXTREMITY CELLULITIS:  For details of changes noted in  right upper extremity, refer to #4 above.  However, on September 28, 2005,  the patient's right upper extremity was noted to be even more swollen,  edematous with increased local temperature, and exquisite tenderness to  palpation.  He did also have regional axillary lymphadenopathy.  He was  noted to have a temperature of 102.3, a tachycardia of 160.  White cell  count was elevated at 14.8, against a normal white cell count of 7.2 on  September 27, 2005.  These features appear consistent with cellulitis, right  upper arm. The patient has been commenced on intravenous vancomycin, and  blood cultures requested.   DISPOSITION:  This will be elucidated at the time of actual discharge, by  discharging physician.  The patient does have some issues with homelessness,  although he currently says that he is staying with his mother, having  decided to  settle in [place] from [facility].  He has communicated that he is in  the process of trying to obtain a place to stay. He was referred to social  worker at the time of admission, and their input will be greatly  appreciated, to assist the patient's final disposition.      Isidor Holts, M.D.  Electronically Signed     CO/MEDQ  D:  09/28/2005  T:  09/28/2005  Job:  [id number]   cc:   Jordan Hawks. Elnoria Howard, MD  Fax: [phone]   Iva Boop, M.D. [facility]  [address] [place], [place] [address]

## 2010-12-20 NOTE — Discharge Summary (Signed)
NAMEPHYLLIP, Scott Keith               ACCOUNT NO.:  [id number]   MEDICAL RECORD NO.:  [id number]          PATIENT TYPE:  INP   LOCATION:  5705                         FACILITY:  [facility]   PHYSICIAN:  Michaelyn Barter, M.D. DATE OF BIRTH:  Nov 17, 1975   DATE OF ADMISSION:  09/08/2005  DATE OF DISCHARGE:  09/13/2005                                 DISCHARGE SUMMARY   PRIMARY CARE PHYSICIAN:  Unassigned.  He sees Dr. Doroteo Glassman at [facility],  [place], [place].   FINAL DIAGNOSES AT THE TIME OF ADMISSION:  1.  Intractable nausea and vomiting.  2.  Neck pain with questionable swelling.  3.  Crohn's flare.  4.  Bilateral iliac vein thrombosis.  5.  Anemia.  6.  Marijuana consumption.  7.  Slightly elevated ESR.   PROCEDURE:  1.  CT scan of the abdomen and pelvis completed 09/08/2005.  2.  X-ray of the neck completed 09/11/2005.  3.  Chest x-ray completed 09/12/2005.   HISTORY OF PRESENT ILLNESS:  [name] is a 35 year old gentleman who  stated that he had been staying in a shelter recently.  On the morning of  his admission, he started to feel sick, nauseated and vomited.  That  continued throughout the course of the day and he was unable to keep any of  his p.o. intake down.  Therefore, he came to the emergency room for further  evaluation.  He complained of some mild abdominal pain which is chronic for  him.   PAST MEDICAL HISTORY:  For past medical history, please see that dictated by  Dr. Lonia Blood on 09/08/2005.   HOSPITAL COURSE:  1.  Intractable nausea and emesis.  Following the patient's admission into      the hospital, he was started on Zofran 4 mg IV q.8h. p.r.n. and      Phenergan 25 mg IV q.4h. p.r.n.  Throughout the several days of my      following the patient, he has never complained of any vomiting or nausea      to me.  Likewise, the nurses have never reported to me that he has had      any of these symptoms.  He has actually been noted to have wondered  off      of the unit multiple times, usually accompanied by his roommate.  Today,      I have actually witnessed the patient sit up and eat what appeared to be      a very large breakfast and likewise last night, I saw the remains of      what appeared to be a very large dinner consumption, therefore over the      past several days, the patient has not had any complaints regarding      nausea and emesis, and it appears that these symptoms have resolved.   1.  Neck pain.  On 09/10/2005, I went to see the patient and I explained to      him that was going to discharge him.  The patient requested to stay one  more day in the hospital.  By 09/11/2005, I went to see the patient      early in the morning and again, told him that I would come back to      discharge him later in the day.  He stated that he felt fine and had no      complaints.  When I came to see the patient later in the day, he      complained that he had just awakened and when he looked into the mirror,      his neck appeared to be swollen.  He stated that he had not noticed it      earlier in the day.  Likewise, he did not have any symptoms related to      it but shortly after seeing his neck swollen, he complained of some pain      in his neck.  I ordered x-rays of the patient's neck and soft tissues      and they were completed on 09/11/2005, the final impression of which was      lateral soft tissue view of the neck shows the airway to appear normal.      The epiglottis is normal in size.  No opaque foreign body is seen and no      prevertebral soft tissue swelling is seen.  Mild degenerative disk      disease is noted at C3-4 with the final impression that this was a      negative lateral soft view of the neck.  The patient was already taking      prednisone.  I provided Benadryl and Zantac.  The patient showed no      symptoms of stridor or respiratory compromise.  I told the patient that      he could stay another day  and I never heard any yadditional complaints      regarding the patient's neck after that period of time.  It appears that      his symptoms resolved.   1.  Crohn's flare.  The patient reported that he does have a history of      Crohn's disease.  When he was admitted into the hospital, the etiology      of his nausea and vomiting was not clear, therefore a CT scan of his      abdomen was completed on 09/08/2005 following his admission.  The final      impression was that there was thrombus in the bilateral iliac veins and      lower IVC with extensive network of venous collaterals indicating at      least some degree of chronicity.  However, new since April of 2006.  No      other findings of significance.  It was also reported that there are no      definite focal lesions of the liver, spleen, pancreas or adrenal glands.      The kidneys were unremarkable.  No adenopathy or ascites.  Likewise, the      pelvis showed extensive iliofemoral thrombus with a single air bubble in      the right femoral venous clot.  With regards to the abdominal      complaints, they have been minimal throughout the course of the      hospitalization.   1.  Bilateral iliac vein thrombosis.  This was confirmed via the CT scan      that was detected.  The patient stated at the time of his admission that      he had previously been taking Lovenox daily secondary to a history of      subclavian vein thrombosis and it was also reported that the patient had      been poorly to noncompliant with his anticoagulation.  The patient was      maintained on Lovenox throughout the course of his hospitalization.  He      has not complained of any leg pain throughout the course of his      hospitalization.   1.  Anemia.  This patient's hemoglobin depicts a microcytic type of anemia.      The source of this is questionable.  Iron deficiency may be playing a     role here.  The patient will have to follow up with his primary  care      physician for further evaluation of this.   1.  Marijuana consumption.  The patient had a urine drug screen completed      and it was found to be positive for marijuana at the time of admission.   1.  Slightly elevated ESR.  The etiology of this is questionable.  Again,      the patient will have to follow up with his primary care physician for      this regarding this.   CONDITION AT THE TIME OF DISCHARGE:  Is improved.  The patient currently  does not complain of any nausea or vomiting.  He has never complained of any  emesis to me.  His discharge medications will consist of the following:  1.  Combivent 2 puffs q.i.d.  2. Lovenox 80 mg subcutaneous q.12h.  3. Protonix  40 mg daily.  4. Dilaudid which he can take p.o. for the next couple of  days.  5. Phenergan 25 mg p.o. q.4h. p.r.n.  6. Pentasa 250 mg p.o. t.i.d.  I will give him a couple of day's supply of that.  The patient will be  instructed to follow up with his primary care physician for further  evaluation and treatment.  The decision to continue full dose of lovenox was  not a  clear cut one.  It had been noted that the pt had refused coumadin in the  past.  I was very reluctant to send him home with dvt prophylaxis dosage of  lovenox, knowing the extent of his thrombosis.  I did tell him that he would  have to follow-up within the next week with his pcp regarding his  anticoagulation, and any long term adjustments.      Michaelyn Barter, M.D.  Electronically Signed     OR/MEDQ  D:  09/13/2005  T:  09/13/2005  Job:  [id number]

## 2010-12-20 NOTE — Discharge Summary (Signed)
Scott Keith, Scott Keith               ACCOUNT NO.:  [id number]   MEDICAL RECORD NO.:  [id number]           PATIENT TYPE:   LOCATION:                                 FACILITY:   PHYSICIAN:  Isidor Holts, M.D.       DATE OF BIRTH:   DATE OF ADMISSION:  DATE OF DISCHARGE:                                 DISCHARGE SUMMARY   REQUESTED BY:  __________   DISCHARGE DIAGNOSES:  1.  Nonspecific abdominal pain/vomiting.  2.  History of Crohn's colitis, status post colectomy. Colonoscopy on November 16, 2004 showed no evidence of active Crohn's.  3.  Right foot cellulitis, resolved.  4.  Multiple osteoporotic stress fractures right foot.  5.  Gastroesophageal reflux disease.   DISCHARGE MEDICATIONS:  1.  Protonix 40 mg p.o. daily.  2.  Pentasa 250 mg two tablets t.i.d.  3.  Prednisone 30 mg p.o. daily for three days then 20 mg p.o. daily for      three days, then 10 mg p.o. daily for three days, then stop.  4.  Vicodin (5/500) one p.o. p.r.n. q.6h. p.r.n. for pain.  5.  Phenergan 25 mg p.o. p.r.n. for nausea.   PROCEDURE:  1.  Abdominal x-ray/chest x-ray on November 13, 2004. This showed air fluid      levels in the colon, as can be encountered in diarrhoeal process.      Patient has known gallstones.  2.  Abdominal x-ray dated November 14, 2004, shows unremarkable gas pattern.  3.  HIDA scan dated November 14, 2004, shows patent common bile and cystic      ducts, also contracted gallbladder with difficult visualization, chronic      cholecystitis is suspected.  4.  Chest x-ray dated November 15, 2004, this showed mild peribronchial      thickening, lung fields are otherwise clear.  5.  Small bowel follow through on November 18, 2004.  This revealed no      abnormality, terminal ileum is well visualized and is normal, no      findings compatible with Crohn's disease.  There is evidence of previous      resection of the distal small bowel with end to side anastomosis of the      distal small bowel  with right colon.   CONSULTATIONS:  1.  Gastroenterology, Dr. Juanda Chance.  2.  Gastroenterology, Dr. Arlyce Dice.   HISTORY OF PRESENT ILLNESS:  As in history and physical notes dated November 13, 2004.  This is a 35 year old African-American male who was admitted to  [facility] on November 08, 2004 and subsequently discharged on November 12, 2004.  Please refer to discharge summary of November 13, 2004 for details.  He represents later in the day, on November 13, 2004 complaining of abdominal  pain and vomiting.   HOSPITAL COURSE:  1.  Abdominal pain/vomiting.  The patient was discharged from [facility] on November 12, 2004 and returned November 13, 2004.  He re-presented      with complaints  of abdominal pain and vomiting.  States that he was      unable to keep food down.  He was therefore placed on bowel rest,      continued IV hydration, antiemetics and p.r.n. analgesics.  On the      suspicion of an acute cholecystitis, given the background history of      incidental cholelithiasis, found during the course of his previous      admission on CT scan, HIDA scan was carried out which showed no evidence      of acute cholecystitis but showed findings suggestive of chronic      cholecystitis.  Gastroenterology consultation was called which was      kindly provided by Dr. Arlyce Dice and Dr. Juanda Chance.  Conclusion was that      patient did not have any features of acute cholelithiasis, however,      needed further work up with a view to excluding active Crohn's disease.      He was commenced on steroid medication and work up was arranged.      Patient underwent lower GI endoscopy on April. 15, 2006.  This showed no      evidence of active Crohn's colitis.  He subsequently underwent small      bowel follow through on November 18, 2004.  This was also negative for      findings of Crohn's disease.  As a matter of fact, there was no      abnormality, found although postoperative changes were noted.   Patient      was not entirely compliant during the course of his hospital stay with      attempts at bowel rest.  However, he showed gradual improvement,      clinically.  As of November 19, 2004, he had become entirely asymptomatic.   1.  Cellulitis of the right foot.  Patient completed full course of      antibiotics with complete resolution of inflammatory phenomena.   1.  Multiple osteoporotic stress fractures right foot. Patient continues to      utilize foot support, as recommended by orthopedics.  He was also      recommended partial weight bearing and he is scheduled to follow up with      Dr. Montez Morita, the orthopedic surgeon on an outpatient basis.  He is to      continue Tums 500 mg t.i.d. for his osteoporosis.   1.  Gastroesophageal reflux disease.  Patient was managed with Proton pump      inhibitor trial during the course of his hospital stay.   DISPOSITION:  The patient was discharged in satisfactory condition on November 19, 2004.   PAIN MANAGEMENT:  Not applicable.   ACTIVITY:  As tolerated, this includes partial weight bearing and  utilization of crutches until seen by orthopedic surgeon, Dr. Montez Morita at  prior appointment.   DISCHARGE DIET:  No restrictions.   WOUND CARE:  Not applicable.   DISCHARGE INSTRUCTIONS:  Patient is to call Dr. Regino Schultze office, telephone  number [phone] for appointment.  This has been communicated to patient and  he has verbalized understanding.  Other arrangements were per discharge  instructions of November 12, 2004.  All of this was communicated to patient and  he verbalized understanding.       CO/MEDQ  D:  01/11/2005  T:  01/11/2005  Job:  [id number]   cc:   Barbette Hair. Arlyce Dice, M.D. [facility]   Lina Sar,  M.D. [facility]   Myrtie Neither, MD  [address] [place]  [place] [address]  Fax: [phone]

## 2010-12-20 NOTE — H&P (Signed)
NAMEKARANDEEP, RESENDE               ACCOUNT NO.:  [id number]   MEDICAL RECORD NO.:  [id number]          PATIENT TYPE:  INP   LOCATION:  0470                         FACILITY:  [facility]   PHYSICIAN:  Gertha Calkin, M.D.DATE OF BIRTH:  11/15/1975   DATE OF ADMISSION:  11/07/2004  DATE OF DISCHARGE:                                HISTORY & PHYSICAL   PHYSICIANS:  1. Primary care physician in [facility] - Dr. Gillie Manners.  2. GI physician in [facility] - Dr. Elwyn Lade.   This is a 35 year old African-American male with known Crohn's disease  diagnosed in 1997 who presents with nausea, vomiting, and diffuse abdominal  pain that began approximately 4 p.m. today after arriving from [facility]  to move in with his cousin here in [place]. He denies any diarrhea, no  hematemesis, no hemoptysis, no melanotic stools. No other symptoms.   PAST MEDICAL HISTORY:  Crohn's disease, gastroesophageal reflux disease.  Past surgical history includes colonic resection back in September 2005 and  had a recent colonoscopy approximately November 2005. The patient has  diagnosed Crohn's from 1997. Has been on and off steroid medications with  flare-ups.   MEDICATIONS:  1. Prednisone 20 mg p.o. daily.  2. Prevacid 40 mg p.o. daily.     FAMILY HISTORY:  Positive only for high blood pressure; otherwise, no  history of Crohn's disease in anyone.   SOCIAL HISTORY:  Denies alcohol and illicit drug use. Does smoke  approximately a half a pack a day for at least 10 or 15 years. 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  Currently is single with two kids 8 and 7. The 33-year-old is a son, the 23-  year-old is a daughter. No medical illnesses. He has two brothers 29 and 73  with no known medical illnesses.   REVIEW OF SYSTEMS:  Positive only for symptoms indicated in the HPI.  Otherwise, no headaches, congestion, cough, colds. No chest pain or  shortness of breath. No GU  symptoms, normal bowels. Positive for right foot  swelling and tenderness approximately a week or two. Has recently had  surgery for hammertoe but has not followed up since then. No chest pain, no  chest tightness, no coughing. No fevers or chills.   PHYSICAL EXAMINATION:  VITAL SIGNS:  Temperature is 99.0, blood pressure of  134/76, pulse of 100, respirations 22, 98% on 2 L nasal cannula.   LABORATORY DATA:  From earlier today:  White count of 14.8, hemoglobin 13.6,  platelets were clumped. UA was negative except for calcium oxalate crystals.  Sodium 139, potassium 3.5, chloride of 106, bicarb of 25, glucose of 78, BUN  of 6, creatinine 0.8, calcium 9.1. Alk phos was elevated at 134; otherwise,  LFTs were normal. CT of the abdomen showed no acute inflammatory processes.   ASSESSMENT AND PLAN:  Since he had been seen earlier at [facility] and  discharged but could not tolerate p.o. antiemetics, admitting him for pain  and nausea control. Will increase his steroid dose and add Cipro to his  regimen,  follow up his labs, repeat a CBC with peripheral smear considering  that this had clumped platelets with some fibrin stranding. Also, for his  right foot, will administer pain medications and check a foot x-ray to make  sure he did not have any occult fracture. May need to pursue this with CT.  Currently, will abstain from Dopplers as this is unusual presentation and  considering the chronological order with recent surgery, will first find out  what the x-ray results show. Otherwise, will give him Protonix for GI issues  as well as put PAS hose bilateral extremities for DVT prophylaxis. In the  meantime, in the morning will try to obtain office records from Dr. Elwyn Lade  who is his GI physician in [facility].      JD/MEDQ  D:  11/08/2004  T:  11/08/2004  Job:  [id number]

## 2011-05-09 LAB — URINE MICROSCOPIC-ADD ON

## 2011-05-09 LAB — URINALYSIS, ROUTINE W REFLEX MICROSCOPIC
Glucose, UA: NEGATIVE mg/dL
Nitrite: NEGATIVE
pH: 6.5 (ref 5.0–8.0)

## 2011-05-09 LAB — DIFFERENTIAL
Basophils Absolute: 0 10*3/uL (ref 0.0–0.1)
Basophils Relative: 1 % (ref 0–1)
Lymphs Abs: 1.2 10*3/uL (ref 0.7–4.0)
Monocytes Absolute: 0.5 10*3/uL (ref 0.1–1.0)
Neutro Abs: 2.7 10*3/uL (ref 1.7–7.7)

## 2011-05-09 LAB — COMPREHENSIVE METABOLIC PANEL
AST: 15 U/L (ref 0–37)
BUN: 8 mg/dL (ref 6–23)
CO2: 21 mEq/L (ref 19–32)
Chloride: 112 mEq/L (ref 96–112)
GFR calc Af Amer: >60 mL/min (ref >60)
Potassium: 3.5 mEq/L (ref 3.5–5.1)
Total Bilirubin: 0.3 mg/dL (ref 0.3–1.2)
Total Protein: 6.4 g/dL (ref 6.0–8.3)

## 2011-05-09 LAB — URIC ACID: Uric Acid, Serum: 4.1 mg/dL (ref 4.0–7.8)

## 2011-05-09 LAB — CBC: RDW: 13.7 % (ref 11.5–15.5)

## 2011-05-09 LAB — LIPASE, BLOOD: Lipase: 37 U/L (ref 11–59)

## 2015-02-07 ENCOUNTER — Emergency Department (HOSPITAL_COMMUNITY)
Admission: EM | Admit: 2015-02-07 | Discharge: 2015-02-07 | Disposition: A | Discharge status: Home / Self Care | Payer: Medicaid Other | Attending: Emergency Medicine | Admitting: Emergency Medicine

## 2015-02-07 ENCOUNTER — Encounter (HOSPITAL_COMMUNITY): Payer: Self-pay | Admitting: Emergency Medicine

## 2015-02-07 ENCOUNTER — Emergency Department (HOSPITAL_COMMUNITY): Payer: Medicaid Other

## 2015-02-07 DIAGNOSIS — R1084 Generalized abdominal pain: Secondary | ICD-10-CM | POA: Diagnosis present

## 2015-02-07 DIAGNOSIS — Z9049 Acquired absence of other specified parts of digestive tract: Secondary | ICD-10-CM | POA: Insufficient documentation

## 2015-02-07 DIAGNOSIS — K5090 Crohn's disease, unspecified, without complications: Secondary | ICD-10-CM | POA: Insufficient documentation

## 2015-02-07 DIAGNOSIS — R109 Unspecified abdominal pain: Secondary | ICD-10-CM

## 2015-02-07 DIAGNOSIS — K5010 Crohn's disease of large intestine without complications: Secondary | ICD-10-CM

## 2015-02-07 HISTORY — DX: Crohn's disease, unspecified, without complications: K50.90

## 2015-02-07 LAB — LIPASE, BLOOD: Lipase: 84 U/L — ABNORMAL HIGH (ref 22–51)

## 2015-02-07 LAB — CBC WITH DIFFERENTIAL/PLATELET
BASOS PCT: 1 % (ref 0–1)
Basophils Absolute: 0 10*3/uL (ref 0.0–0.1)
EOS ABS: 0.2 10*3/uL (ref 0.0–0.7)
EOS PCT: 2 % (ref 0–5)
HCT: 42.8 % (ref 39.0–52.0)
Hemoglobin: 13.6 g/dL (ref 13.0–17.0)
LYMPHS ABS: 1.7 10*3/uL (ref 0.7–4.0)
Lymphocytes Relative: 25 % (ref 12–46)
MCH: 27.9 pg (ref 26.0–34.0)
MCHC: 31.8 g/dL (ref 30.0–36.0)
MCV: 87.9 fL (ref 78.0–100.0)
Monocytes Absolute: 0.5 10*3/uL (ref 0.1–1.0)
Monocytes Relative: 8 % (ref 3–12)
Neutro Abs: 4.4 10*3/uL (ref 1.7–7.7)
Neutrophils Relative %: 64 % (ref 43–77)
Platelets: 197 10*3/uL (ref 150–400)
RBC: 4.87 MIL/uL (ref 4.22–5.81)
RDW: 14.8 % (ref 11.5–15.5)
WBC: 6.8 10*3/uL (ref 4.0–10.5)

## 2015-02-07 LAB — URINALYSIS, ROUTINE W REFLEX MICROSCOPIC
BILIRUBIN URINE: NEGATIVE
GLUCOSE, UA: NEGATIVE mg/dL
HGB URINE DIPSTICK: NEGATIVE
KETONES UR: NEGATIVE mg/dL
Leukocytes, UA: NEGATIVE
Nitrite: NEGATIVE
PROTEIN: NEGATIVE mg/dL
Specific Gravity, Urine: 1.025 (ref 1.005–1.030)
Urobilinogen, UA: 0.2 mg/dL (ref 0.0–1.0)
pH: 6.5 (ref 5.0–8.0)

## 2015-02-07 LAB — COMPREHENSIVE METABOLIC PANEL
ALBUMIN: 2.8 g/dL — AB (ref 3.5–5.0)
ALT: 12 U/L — ABNORMAL LOW (ref 17–63)
ANION GAP: 6 (ref 5–15)
AST: 17 U/L (ref 15–41)
Alkaline Phosphatase: 82 U/L (ref 38–126)
BUN: 10 mg/dL (ref 6–20)
CO2: 22 mmol/L (ref 22–32)
CREATININE: 1.03 mg/dL (ref 0.61–1.24)
Calcium: 7.9 mg/dL — ABNORMAL LOW (ref 8.9–10.3)
Chloride: 114 mmol/L — ABNORMAL HIGH (ref 101–111)
GFR calc Af Amer: >60 mL/min (ref >60)
GFR calc non Af Amer: >60 mL/min (ref >60)
Glucose, Bld: 111 mg/dL — ABNORMAL HIGH (ref 65–99)
Potassium: 3.5 mmol/L (ref 3.5–5.1)
Sodium: 142 mmol/L (ref 135–145)
TOTAL PROTEIN: 5.4 g/dL — AB (ref 6.5–8.1)
Total Bilirubin: 0.5 mg/dL (ref 0.3–1.2)

## 2015-02-07 MED ORDER — PROMETHAZINE HCL 25 MG/ML IJ SOLN
25.0000 mg | Freq: Once | INTRAMUSCULAR | Status: AC
  Administered 2015-02-07: 25 mg via INTRAMUSCULAR

## 2015-02-07 MED ORDER — HYDROMORPHONE HCL 1 MG/ML IJ SOLN
1.0000 mg | Freq: Once | INTRAMUSCULAR | Status: DC
  Filled 2015-02-07: qty 1

## 2015-02-07 MED ORDER — SODIUM CHLORIDE 0.9 % IV BOLUS (SEPSIS): 1000.0000 mL | Freq: Once | INTRAVENOUS | Status: DC

## 2015-02-07 MED ORDER — PROMETHAZINE HCL 25 MG/ML IJ SOLN
INTRAMUSCULAR | Status: AC
  Filled 2015-02-07: qty 1

## 2015-02-07 MED ORDER — ONDANSETRON 8 MG PO TBDP
8.0000 mg | ORAL_TABLET | Freq: Once | ORAL | Status: DC
  Filled 2015-02-07: qty 1

## 2015-02-07 MED ORDER — HYDROMORPHONE HCL 1 MG/ML IJ SOLN
1.0000 mg | Freq: Once | INTRAMUSCULAR | Status: AC
  Administered 2015-02-07: 1 mg via INTRAMUSCULAR

## 2015-02-07 MED ORDER — OXYCODONE-ACETAMINOPHEN 5-325 MG PO TABS
2.0000 | ORAL_TABLET | Freq: Once | ORAL | Status: AC
  Administered 2015-02-07: 2 via ORAL
  Filled 2015-02-07: qty 2

## 2015-02-07 MED ORDER — METHYLPREDNISOLONE SODIUM SUCC 125 MG IJ SOLR
125.0000 mg | Freq: Once | INTRAMUSCULAR | Status: DC
  Filled 2015-02-07: qty 2

## 2015-02-07 MED ORDER — OXYCODONE-ACETAMINOPHEN 5-325 MG PO TABS: 2.0000 | ORAL_TABLET | ORAL | Status: DC | PRN

## 2015-02-07 MED ORDER — PROMETHAZINE HCL 25 MG PO TABS: 25.0000 mg | ORAL_TABLET | Freq: Four times a day (QID) | ORAL | Status: DC | PRN

## 2015-02-07 MED ORDER — PREDNISONE 20 MG PO TABS: ORAL_TABLET | ORAL | Status: DC

## 2015-02-07 MED ORDER — METHYLPREDNISOLONE SODIUM SUCC 125 MG IJ SOLR
125.0000 mg | Freq: Once | INTRAMUSCULAR | Status: AC
  Administered 2015-02-07: 125 mg via INTRAMUSCULAR

## 2015-02-07 NOTE — ED Notes (Signed)
Patient requesting phenergan instead of zofran. IV attempted x 3 without success by 2 nurses. MD notified, verbal order to change medications to IM and change zofran to Phenergan 25 mg IM,.

## 2015-02-07 NOTE — Discharge Instructions (Signed)
Crohn Disease  Crohn disease is a long-term (chronic) soreness and redness (inflammation) of the intestines (bowel). It can affect any portion of the digestive tract, from the mouth to the anus. It can also cause problems outside the digestive tract. Crohn disease is closely related to a disease called ulcerative colitis (together, these two diseases are called inflammatory bowel disease).   CAUSES   The cause of Crohn disease is not known. One theory is that, in an easily affected person, the immune system is triggered to attack the body's own digestive tissue. Crohn disease runs in families. It seems to be more common in certain geographic areas and amongst certain races. There are no clear-cut dietary causes.   SYMPTOMS   Crohn disease can cause many different symptoms since it can affect many different parts of the body. Symptoms include:  · Fatigue.  · Weight loss.  · Chronic diarrhea, sometime bloody.  · Abdominal pain and cramps.  · Fever.  · Ulcers or canker sores in the mouth or rectum.  · Anemia (low red blood cells).  · Arthritis, skin problems, and eye problems may occur.  Complications of Crohn disease can include:  · Series of holes (perforation) of the bowel.  · Portions of the intestines sticking to each other (adhesions).  · Obstruction of the bowel.  · Fistula formation, typically in the rectal area but also in other areas. A fistula is an opening between the bowels and the outside, or between the bowels and another organ.  · A painful crack in the mucous membrane of the anus (rectal fissure).  DIAGNOSIS   Your caregiver may suspect Crohn disease based on your symptoms and an exam. Blood tests may confirm that there is a problem. You may be asked to submit a stool specimen for examination. X-rays and CT scans may be necessary. Ultimately, the diagnosis is usually made after a procedure that uses a flexible tube that is inserted via your mouth or your anus. This is done under sedation and is called  either an upper endoscopy or colonoscopy. With these tests, the specialist can take tiny tissue samples and remove them from the inside of the bowel (biopsy). Examination of this biopsy tissue under a microscope can reveal Crohn disease as the cause of your symptoms.  Due to the many different forms that Crohn disease can take, symptoms may be present for several years before a diagnosis is made.  TREATMENT   Medications are often used to decrease inflammation and control the immune system. These include medicines related to aspirin, steroid medications, and newer and stronger medications to slow down the immune system. Some medications may be used as suppositories or enemas. A number of other medications are used or have been studied. Your caregiver will make specific recommendations.  HOME CARE INSTRUCTIONS   · Symptoms such as diarrhea can be controlled with medications. Avoid foods that have a laxative effect such as fresh fruit, vegetables, and dairy products. During flare-ups, you can rest your bowel by refraining from solid foods. Drink clear liquids frequently during the day. (Electrolyte or rehydrating fluids are best. Your caregiver can help you with suggestions.) Drink often to prevent loss of body fluids (dehydration). When diarrhea has cleared, eat small meals and more frequently. Avoid food additives and stimulants such as caffeine (coffee, tea, or chocolate). Enzyme supplements may help if you develop intolerance to a sugar in dairy products (lactose). Ask your caregiver or dietitian about specific dietary instructions.  · Try to   maintain a positive attitude. Learn relaxation techniques such as self-hypnosis, mental imaging, and muscle relaxation.  · If possible, avoid stresses which can aggravate your condition.  · Exercise regularly.  · Follow your diet.  · Always get plenty of rest.  SEEK MEDICAL CARE IF:   · Your symptoms fail to improve after a week or two of new treatment.  · You experience  continued weight loss.  · You have ongoing cramps or loose bowels.  · You develop a new skin rash, skin sores, or eye problems.  SEEK IMMEDIATE MEDICAL CARE IF:   · You have worsening of your symptoms or develop new symptoms.  · You have a fever.  · You develop bloody diarrhea.  · You develop severe abdominal pain.  MAKE SURE YOU:   · Understand these instructions.  · Will watch your condition.  · Will get help right away if you are not doing well or get worse.  Document Released: 04/30/2005 Document Revised: 12/05/2013 Document Reviewed: 03/29/2007  ExitCare® Patient Information ©2015 [employer]. This information is not intended to replace advice given to you by your health care provider. Make sure you discuss any questions you have with your health care provider.

## 2015-02-07 NOTE — ED Notes (Signed)
Pt has hx of crohn's onset this morning abdominal cramping, vomiting and diarrhea, MD at the bedside

## 2015-02-07 NOTE — ED Provider Notes (Signed)
CSN: [id number]     Arrival date & time 02/07/15  0828 History  This chart was scribed for Gilda Crease, MD by Elon Spanner, ED Scribe. This patient was seen in room APA04/APA04 and the patient's care was started at 8:32 AM.  No chief complaint on file.  The history is provided by the patient. No language interpreter was used.   HPI Comments: Scott Keith is a 39 y.o. male who presents to the Emergency Department complaining of diffuse abdominal pain onset this morning with associated vomiting and diarrhea.  He reports a history of crohn's disease for which he takes 10 mg prednisone daily and is followed by Dr. Worthy Keeler [place], [place]).  Per patient, today's episode is typical of his crohn's flares.  He has not taken any medication for pain.   He denies recent sick contacts.  He denies history of asthma.  He denies fever, bloody stool.     No past medical history on file. No past surgical history on file. No family history on file. History  Substance Use Topics  . Smoking status: Not on file  . Smokeless tobacco: Not on file  . Alcohol Use: Not on file    Review of Systems  All other systems reviewed and are negative.     Allergies  Review of patient's allergies indicates not on file.  Home Medications   Prior to Admission medications   Not on File   There were no vitals taken for this visit. Physical Exam  Constitutional: He is oriented to person, place, and time. He appears well-developed and well-nourished. No distress.  HENT:  Head: Normocephalic and atraumatic.  Right Ear: Hearing normal.  Left Ear: Hearing normal.  Nose: Nose normal.  Mouth/Throat: Oropharynx is clear and moist and mucous membranes are normal.  Eyes: Conjunctivae and EOM are normal. Pupils are equal, round, and reactive to light.  Neck: Normal range of motion. Neck supple.  Cardiovascular: Regular rhythm, S1 normal and S2 normal.  Exam reveals no gallop and no friction rub.   No murmur  heard. Pulmonary/Chest: Effort normal and breath sounds normal. No respiratory distress. He exhibits no tenderness.  Abdominal: Soft. Normal appearance and bowel sounds are normal. There is no hepatosplenomegaly. There is no rebound, no guarding, no tenderness at McBurney's point and negative Murphy's sign. No hernia.  Diffuse but more lower abdominal tenderness without guarding or rebound.   Musculoskeletal: Normal range of motion.  Neurological: He is alert and oriented to person, place, and time. He has normal strength. No cranial nerve deficit or sensory deficit. Coordination normal. GCS eye subscore is 4. GCS verbal subscore is 5. GCS motor subscore is 6.  Skin: Skin is warm, dry and intact. No rash noted. No cyanosis.  Psychiatric: He has a normal mood and affect. His speech is normal and behavior is normal. Thought content normal.  Nursing note and vitals reviewed.   ED Course  Procedures (including critical care time)  DIAGNOSTIC STUDIES: Oxygen Saturation is 100% on RA, normal by my interpretation.    COORDINATION OF CARE:  8:36 AM Discussed treatment plan with patient at bedside.  Patient acknowledges and agrees with plan.    Labs Review Labs Reviewed - No data to display  Imaging Review No results found.   EKG Interpretation None      MDM   Final diagnoses:  None   abdominal pain  Crohn's disease  Patient presents to the ER for evaluation of abdominal pain. Patient reports a  history of Crohn's disease. Symptoms began one or 2 hours before arrival in the ER. He has not had any rectal bleeding or hematemesis. Patient's abdominal examination revealed diffuse tenderness without guarding or rebound. Patient reports that he is maintained on prednisone for his Crohn's disease. He was given Solu-Medrol here in the ER. Patient treated with analgesia and IV fluids. Blood work is entirely normal other than a very slightly elevated lipase at 84. This is a nonspecific finding. He  is afebrile. Vital signs are stable. X-ray does not show any evidence of obstruction. Patient will be discharged, analgesia, prednisone taper back down to his normal 10 mg daily. Follow-up with his GI doctor.  I personally performed the services described in this documentation, which was scribed in my presence. The recorded information has been reviewed and is accurate.   Gilda Crease, MD 02/07/15 1113

## 2015-02-07 NOTE — ED Notes (Signed)
Patient with no complaints at this time. Respirations even and unlabored. Skin warm/dry. Discharge instructions reviewed with patient at this time. Patient given opportunity to voice concerns/ask questions. Patient discharged at this time and left Emergency Department with steady gait.   

## 2015-02-13 ENCOUNTER — Encounter (HOSPITAL_COMMUNITY): Payer: Self-pay | Admitting: Emergency Medicine

## 2015-02-13 ENCOUNTER — Emergency Department (HOSPITAL_COMMUNITY)
Admission: EM | Admit: 2015-02-13 | Discharge: 2015-02-13 | Disposition: A | Discharge status: Home / Self Care | Payer: Medicaid Other | Attending: Emergency Medicine | Admitting: Emergency Medicine

## 2015-02-13 DIAGNOSIS — Z9049 Acquired absence of other specified parts of digestive tract: Secondary | ICD-10-CM | POA: Diagnosis not present

## 2015-02-13 DIAGNOSIS — Z8719 Personal history of other diseases of the digestive system: Secondary | ICD-10-CM | POA: Insufficient documentation

## 2015-02-13 DIAGNOSIS — R1032 Left lower quadrant pain: Secondary | ICD-10-CM | POA: Insufficient documentation

## 2015-02-13 DIAGNOSIS — Z7952 Long term (current) use of systemic steroids: Secondary | ICD-10-CM | POA: Insufficient documentation

## 2015-02-13 DIAGNOSIS — Z720 Tobacco use: Secondary | ICD-10-CM | POA: Diagnosis not present

## 2015-02-13 DIAGNOSIS — R109 Unspecified abdominal pain: Secondary | ICD-10-CM

## 2015-02-13 DIAGNOSIS — R112 Nausea with vomiting, unspecified: Secondary | ICD-10-CM | POA: Diagnosis present

## 2015-02-13 LAB — CBC WITH DIFFERENTIAL/PLATELET
Basophils Absolute: 0 10*3/uL (ref 0.0–0.1)
Basophils Relative: 0 % (ref 0–1)
EOS ABS: 0.1 10*3/uL (ref 0.0–0.7)
EOS PCT: 1 % (ref 0–5)
HCT: 40.6 % (ref 39.0–52.0)
HEMOGLOBIN: 13 g/dL (ref 13.0–17.0)
LYMPHS ABS: 0.9 10*3/uL (ref 0.7–4.0)
LYMPHS PCT: 14 % (ref 12–46)
MCH: 27.8 pg (ref 26.0–34.0)
MCHC: 32 g/dL (ref 30.0–36.0)
MCV: 86.9 fL (ref 78.0–100.0)
MONOS PCT: 5 % (ref 3–12)
Monocytes Absolute: 0.4 10*3/uL (ref 0.1–1.0)
NEUTROS PCT: 80 % — AB (ref 43–77)
Neutro Abs: 5.1 10*3/uL (ref 1.7–7.7)
PLATELETS: 189 10*3/uL (ref 150–400)
RBC: 4.67 MIL/uL (ref 4.22–5.81)
RDW: 14.8 % (ref 11.5–15.5)
WBC: 6.5 10*3/uL (ref 4.0–10.5)

## 2015-02-13 LAB — COMPREHENSIVE METABOLIC PANEL
ALBUMIN: 2.8 g/dL — AB (ref 3.5–5.0)
ALT: 18 U/L (ref 17–63)
ANION GAP: 4 — AB (ref 5–15)
AST: 17 U/L (ref 15–41)
Alkaline Phosphatase: 83 U/L (ref 38–126)
BUN: 11 mg/dL (ref 6–20)
CALCIUM: 7.8 mg/dL — AB (ref 8.9–10.3)
CHLORIDE: 110 mmol/L (ref 101–111)
CO2: 24 mmol/L (ref 22–32)
Creatinine, Ser: 0.91 mg/dL (ref 0.61–1.24)
GFR calc Af Amer: >60 mL/min (ref >60)
Glucose, Bld: 102 mg/dL — ABNORMAL HIGH (ref 65–99)
Potassium: 3.7 mmol/L (ref 3.5–5.1)
Sodium: 138 mmol/L (ref 135–145)
Total Bilirubin: 0.3 mg/dL (ref 0.3–1.2)
Total Protein: 5.1 g/dL — ABNORMAL LOW (ref 6.5–8.1)

## 2015-02-13 LAB — LIPASE, BLOOD: Lipase: 35 U/L (ref 22–51)

## 2015-02-13 MED ORDER — PROMETHAZINE HCL 25 MG PO TABS: 25.0000 mg | ORAL_TABLET | Freq: Four times a day (QID) | ORAL | Status: AC | PRN

## 2015-02-13 MED ORDER — OXYCODONE-ACETAMINOPHEN 5-325 MG PO TABS: 1.0000 | ORAL_TABLET | Freq: Four times a day (QID) | ORAL | Status: DC | PRN

## 2015-02-13 MED ORDER — PROMETHAZINE HCL 25 MG PO TABS: 25.0000 mg | ORAL_TABLET | Freq: Four times a day (QID) | ORAL | Status: DC | PRN

## 2015-02-13 MED ORDER — HYDROMORPHONE HCL 1 MG/ML IJ SOLN
1.0000 mg | Freq: Once | INTRAMUSCULAR | Status: AC
  Administered 2015-02-13: 1 mg via INTRAVENOUS
  Filled 2015-02-13: qty 1

## 2015-02-13 MED ORDER — HYDROCODONE-ACETAMINOPHEN 5-325 MG PO TABS: 1.0000 | ORAL_TABLET | ORAL | Status: DC | PRN

## 2015-02-13 MED ORDER — ONDANSETRON HCL 4 MG/2ML IJ SOLN
4.0000 mg | Freq: Once | INTRAMUSCULAR | Status: AC
  Administered 2015-02-13: 4 mg via INTRAVENOUS
  Filled 2015-02-13: qty 2

## 2015-02-13 MED ORDER — SODIUM CHLORIDE 0.9 % IV BOLUS (SEPSIS)
1000.0000 mL | Freq: Once | INTRAVENOUS | Status: AC
  Administered 2015-02-13: 1000 mL via INTRAVENOUS

## 2015-02-13 MED ORDER — FENTANYL CITRATE (PF) 100 MCG/2ML IJ SOLN
100.0000 ug | Freq: Once | INTRAMUSCULAR | Status: AC
  Administered 2015-02-13: 100 ug via INTRAVENOUS
  Filled 2015-02-13: qty 2

## 2015-02-13 NOTE — Discharge Instructions (Signed)
Blood work was good. Medication for pain and nausea. Follow-up your doctor.

## 2015-02-13 NOTE — ED Provider Notes (Signed)
CSN: [id number]     Arrival date & time 02/13/15  1406 History   First MD Initiated Contact with Patient 02/13/15 1507     Chief Complaint  Patient presents with  . Nausea  . Emesis     (Consider location/radiation/quality/duration/timing/severity/associated sxs/prior Treatment) HPI.... Nausea, vomiting, left lower abdominal pain since approximately 1 PM today. Past medical history includes Crohn's disease. His doctor is in [place], [place]. He is currently taking prednisone. He is eating. Severity is mild to moderate. Nothing makes symptoms better or worse.  Past Medical History  Diagnosis Date  . Crohn disease    Past Surgical History  Procedure Laterality Date  . Cholecystectomy     History reviewed. No pertinent family history. History  Substance Use Topics  . Smoking status: Current Every Day Smoker -- 0.50 packs/day  . Smokeless tobacco: Not on file  . Alcohol Use: Yes     Comment: occ    Review of Systems  All other systems reviewed and are negative.     Allergies  Toradol and Morphine and related  Home Medications   Prior to Admission medications   Medication Sig Start Date End Date Taking? Authorizing Provider  oxyCODONE-acetaminophen (PERCOCET) 5-325 MG per tablet Take 2 tablets by mouth every 4 (four) hours as needed. 02/07/15  Yes Gilda Crease, MD  predniSONE (DELTASONE) 10 MG tablet Take 10 mg by mouth daily with breakfast.   Yes Historical Provider, MD  predniSONE (DELTASONE) 20 MG tablet 3 tabs po daily x 4 days, then 2 tabs x 4 days, then 1.5 tabs x 4 days, then 1 tab x 3 days, then back to normal daily dosing 02/07/15  Yes Gilda Crease, MD  HYDROcodone-acetaminophen (NORCO/VICODIN) 5-325 MG per tablet Take 1-2 tablets by mouth every 4 (four) hours as needed. 02/13/15   [name], MD  promethazine (PHENERGAN) 25 MG tablet Take 1 tablet (25 mg total) by mouth every 6 (six) hours as needed. 02/13/15   [name], MD   BP 107/74  mmHg  Pulse 82  Temp(Src) 98.9 F (37.2 C) (Oral)  Resp 20  Ht  (1.727 m)  Wt 150 lb (68.04 kg)  BMI 22.81 kg/m2  SpO2 98% Physical Exam  Constitutional: He is oriented to person, place, and time. He appears well-developed and well-nourished.  HENT:  Head: Normocephalic and atraumatic.  Eyes: Conjunctivae and EOM are normal. Pupils are equal, round, and reactive to light.  Neck: Normal range of motion. Neck supple.  Cardiovascular: Normal rate and regular rhythm.   Pulmonary/Chest: Effort normal and breath sounds normal.  Abdominal: Soft. Bowel sounds are normal.  Minimal left lower quadrant tenderness  Musculoskeletal: Normal range of motion.  Neurological: He is alert and oriented to person, place, and time.  Skin: Skin is warm and dry.  Psychiatric: He has a normal mood and affect. His behavior is normal.  Nursing note and vitals reviewed.   ED Course  Procedures (including critical care time) Labs Review Labs Reviewed  CBC WITH DIFFERENTIAL/PLATELET - Abnormal; Notable for the following:    Neutrophils Relative % 80 (*)    All other components within normal limits  COMPREHENSIVE METABOLIC PANEL - Abnormal; Notable for the following:    Glucose, Bld 102 (*)    Calcium 7.8 (*)    Total Protein 5.1 (*)    Albumin 2.8 (*)    Anion gap 4 (*)    All other components within normal limits  LIPASE, BLOOD  Imaging Review No results found.   EKG Interpretation None      MDM   Final diagnoses:  Abdominal pain, unspecified abdominal location    No acute abdomen. White count and liver functions all normal. A feels better after IV fluids and pain management. Vital signs are normal. Discharge medication Norco and Phenergan 25 mg    [name], MD 02/13/15 2006

## 2015-02-13 NOTE — ED Notes (Signed)
Phlebotomy at bedside.

## 2015-02-13 NOTE — ED Notes (Signed)
Having n/v since 1300.  Vomited twice since 1300.  Took vinegar without relief.

## 2015-02-21 ENCOUNTER — Encounter (HOSPITAL_COMMUNITY): Payer: Self-pay | Admitting: Emergency Medicine

## 2015-02-21 ENCOUNTER — Emergency Department (HOSPITAL_COMMUNITY)
Admission: EM | Admit: 2015-02-21 | Discharge: 2015-02-21 | Discharge status: Against Medical Advice | Payer: Medicaid Other | Attending: Emergency Medicine | Admitting: Emergency Medicine

## 2015-02-21 ENCOUNTER — Emergency Department (HOSPITAL_COMMUNITY): Payer: Medicaid Other

## 2015-02-21 DIAGNOSIS — R109 Unspecified abdominal pain: Secondary | ICD-10-CM | POA: Diagnosis present

## 2015-02-21 DIAGNOSIS — R197 Diarrhea, unspecified: Secondary | ICD-10-CM | POA: Insufficient documentation

## 2015-02-21 DIAGNOSIS — Z720 Tobacco use: Secondary | ICD-10-CM | POA: Diagnosis not present

## 2015-02-21 DIAGNOSIS — Z7952 Long term (current) use of systemic steroids: Secondary | ICD-10-CM | POA: Insufficient documentation

## 2015-02-21 DIAGNOSIS — R6883 Chills (without fever): Secondary | ICD-10-CM | POA: Diagnosis not present

## 2015-02-21 DIAGNOSIS — Z9089 Acquired absence of other organs: Secondary | ICD-10-CM | POA: Insufficient documentation

## 2015-02-21 DIAGNOSIS — R1032 Left lower quadrant pain: Secondary | ICD-10-CM | POA: Diagnosis not present

## 2015-02-21 DIAGNOSIS — R112 Nausea with vomiting, unspecified: Secondary | ICD-10-CM | POA: Insufficient documentation

## 2015-02-21 DIAGNOSIS — Z79899 Other long term (current) drug therapy: Secondary | ICD-10-CM | POA: Diagnosis not present

## 2015-02-21 DIAGNOSIS — Z8719 Personal history of other diseases of the digestive system: Secondary | ICD-10-CM | POA: Diagnosis not present

## 2015-02-21 LAB — LIPASE, BLOOD: LIPASE: 33 U/L (ref 22–51)

## 2015-02-21 LAB — COMPREHENSIVE METABOLIC PANEL
ALT: 17 U/L (ref 17–63)
ANION GAP: 7 (ref 5–15)
AST: 17 U/L (ref 15–41)
Albumin: 3.1 g/dL — ABNORMAL LOW (ref 3.5–5.0)
Alkaline Phosphatase: 100 U/L (ref 38–126)
BUN: 11 mg/dL (ref 6–20)
CHLORIDE: 107 mmol/L (ref 101–111)
CO2: 25 mmol/L (ref 22–32)
Calcium: 8.1 mg/dL — ABNORMAL LOW (ref 8.9–10.3)
Creatinine, Ser: 1.04 mg/dL (ref 0.61–1.24)
Glucose, Bld: 97 mg/dL (ref 65–99)
POTASSIUM: 3.5 mmol/L (ref 3.5–5.1)
Sodium: 139 mmol/L (ref 135–145)
Total Bilirubin: 0.4 mg/dL (ref 0.3–1.2)
Total Protein: 5.8 g/dL — ABNORMAL LOW (ref 6.5–8.1)

## 2015-02-21 LAB — CBC WITH DIFFERENTIAL/PLATELET
BASOS PCT: 0 % (ref 0–1)
Basophils Absolute: 0 10*3/uL (ref 0.0–0.1)
EOS ABS: 0.1 10*3/uL (ref 0.0–0.7)
EOS PCT: 1 % (ref 0–5)
HCT: 45.7 % (ref 39.0–52.0)
Hemoglobin: 14.4 g/dL (ref 13.0–17.0)
Lymphocytes Relative: 21 % (ref 12–46)
Lymphs Abs: 1.5 10*3/uL (ref 0.7–4.0)
MCH: 27.6 pg (ref 26.0–34.0)
MCHC: 31.5 g/dL (ref 30.0–36.0)
MCV: 87.5 fL (ref 78.0–100.0)
Monocytes Absolute: 0.4 10*3/uL (ref 0.1–1.0)
Monocytes Relative: 6 % (ref 3–12)
NEUTROS ABS: 5 10*3/uL (ref 1.7–7.7)
Neutrophils Relative %: 72 % (ref 43–77)
PLATELETS: 186 10*3/uL (ref 150–400)
RBC: 5.22 MIL/uL (ref 4.22–5.81)
RDW: 14.6 % (ref 11.5–15.5)
WBC: 7 10*3/uL (ref 4.0–10.5)

## 2015-02-21 MED ORDER — ONDANSETRON 4 MG PO TBDP
4.0000 mg | ORAL_TABLET | Freq: Once | ORAL | Status: AC
  Administered 2015-02-21: 4 mg via ORAL
  Filled 2015-02-21: qty 1

## 2015-02-21 MED ORDER — FENTANYL CITRATE (PF) 100 MCG/2ML IJ SOLN
50.0000 ug | Freq: Once | INTRAMUSCULAR | Status: AC
  Administered 2015-02-21: 50 ug via INTRAMUSCULAR
  Filled 2015-02-21: qty 2

## 2015-02-21 MED ORDER — SODIUM CHLORIDE 0.9 % IV SOLN: INTRAVENOUS | Status: DC

## 2015-02-21 MED ORDER — SODIUM CHLORIDE 0.9 % IV BOLUS (SEPSIS): 1000.0000 mL | Freq: Once | INTRAVENOUS | Status: DC

## 2015-02-21 NOTE — ED Notes (Signed)
Multiple attempts by several nurses to obtain IV access, unsuccessful.

## 2015-02-21 NOTE — ED Provider Notes (Signed)
CSN: [id number]     Arrival date & time 02/21/15  1525 History   First MD Initiated Contact with Patient 02/21/15 1551     Chief Complaint  Patient presents with  . Abdominal Pain     (Consider location/radiation/quality/duration/timing/severity/associated sxs/prior Treatment) Patient is a 39 y.o. male presenting with abdominal pain. The history is provided by the patient.  Abdominal Pain Associated symptoms: chills, diarrhea, nausea and vomiting   Associated symptoms: no chest pain, no fever, no hematuria and no shortness of breath    patient states that he's had the intermittent but mostly constant abdominal pain for the past 3 weeks. This is patient's third visit here to the emergency department for same. States he has a history of Crohn's disease and is followed by GI medicine in the [place] area. Patient has not had a CAT scan during the July visits. Patient states the pain has been worse in the last 2 days. Associated with nausea vomiting and diarrhea but no blood associated with chills. Patient states abdominal pain is 8 out of 10 sharp ache in nature. Nonradiating.  Past Medical History  Diagnosis Date  . Crohn disease    Past Surgical History  Procedure Laterality Date  . Cholecystectomy     History reviewed. No pertinent family history. History  Substance Use Topics  . Smoking status: Current Every Day Smoker -- 0.50 packs/day  . Smokeless tobacco: Not on file  . Alcohol Use: Yes     Comment: occ    Review of Systems  Constitutional: Positive for chills. Negative for fever.  HENT: Negative for congestion.   Eyes: Negative for redness.  Respiratory: Negative for shortness of breath.   Cardiovascular: Negative for chest pain.  Gastrointestinal: Positive for nausea, vomiting, abdominal pain and diarrhea. Negative for blood in stool.  Genitourinary: Negative for hematuria.  Musculoskeletal: Negative for back pain.  Skin: Negative for rash.  Neurological: Negative  for headaches.  Hematological: Does not bruise/bleed easily.  Psychiatric/Behavioral: Negative for confusion.      Allergies  Toradol and Morphine and related  Home Medications   Prior to Admission medications   Medication Sig Start Date End Date Taking? Authorizing Provider  mesalamine (PENTASA) 250 MG CR capsule Take 250 mg by mouth 4 (four) times daily.    Yes Historical Provider, MD  ondansetron (ZOFRAN) 4 MG tablet Take 4 mg by mouth every 8 (eight) hours as needed for nausea or vomiting.  01/25/15  Yes Historical Provider, MD  oxyCODONE (ROXICODONE) 5 MG immediate release tablet Take 1 tablet by mouth every 6 (six) hours as needed for moderate pain.  01/25/15  Yes Historical Provider, MD  oxyCODONE-acetaminophen (PERCOCET/ROXICET) 5-325 MG per tablet Take 1-2 tablets by mouth every 6 (six) hours as needed. 02/13/15  Yes [name], MD  predniSONE (DELTASONE) 10 MG tablet Take 10 mg by mouth daily with breakfast.   Yes Historical Provider, MD  predniSONE (DELTASONE) 10 MG tablet Take by mouth. 6,6,5,5,4,4,3,3,2,2,1,1,.5,.5 01/25/15  Yes Historical Provider, MD  promethazine (PHENERGAN) 25 MG tablet Take 1 tablet (25 mg total) by mouth every 6 (six) hours as needed. 02/13/15  Yes [name], MD  predniSONE (DELTASONE) 20 MG tablet 3 tabs po daily x 4 days, then 2 tabs x 4 days, then 1.5 tabs x 4 days, then 1 tab x 3 days, then back to normal daily dosing 02/07/15   Gilda Crease, MD   BP 103/70 mmHg  Pulse 97  Temp(Src) 98.4 F (36.9 C) (Oral)  Resp 15  Ht 5\' 8"  (1.727 m)  Wt 145 lb (65.772 kg)  BMI 22.05 kg/m2  SpO2 98% Physical Exam  Constitutional: He is oriented to person, place, and time. He appears well-developed and well-nourished. No distress.  HENT:  Head: Normocephalic and atraumatic.  Mouth/Throat: Oropharynx is clear and moist.  Eyes: Conjunctivae and EOM are normal. Pupils are equal, round, and reactive to light.  Neck: Normal range of motion.  Cardiovascular:  Normal rate, regular rhythm and normal heart sounds.   Abdominal: Soft. Bowel sounds are normal. There is tenderness.  Left lower quadrant tenderness  Musculoskeletal: Normal range of motion. He exhibits no edema.  Neurological: He is alert and oriented to person, place, and time. No cranial nerve deficit. He exhibits normal muscle tone. Coordination normal.  Skin: Skin is warm. No rash noted.  Nursing note and vitals reviewed.   ED Course  Procedures (including critical care time) Labs Review Labs Reviewed  COMPREHENSIVE METABOLIC PANEL - Abnormal; Notable for the following:    Calcium 8.1 (*)    Total Protein 5.8 (*)    Albumin 3.1 (*)    All other components within normal limits  LIPASE, BLOOD  CBC WITH DIFFERENTIAL/PLATELET   Results for orders placed or performed during the hospital encounter of 02/21/15  Comprehensive metabolic panel  Result Value Ref Range   Sodium 139 135 - 145 mmol/L   Potassium 3.5 3.5 - 5.1 mmol/L   Chloride 107 101 - 111 mmol/L   CO2 25 22 - 32 mmol/L   Glucose, Bld 97 65 - 99 mg/dL   BUN 11 6 - 20 mg/dL   Creatinine, Ser 1.61 0.61 - 1.24 mg/dL   Calcium 8.1 (L) 8.9 - 10.3 mg/dL   Total Protein 5.8 (L) 6.5 - 8.1 g/dL   Albumin 3.1 (L) 3.5 - 5.0 g/dL   AST 17 15 - 41 U/L   ALT 17 17 - 63 U/L   Alkaline Phosphatase 100 38 - 126 U/L   Total Bilirubin 0.4 0.3 - 1.2 mg/dL   GFR calc non Af Amer >60 >60 mL/min   GFR calc Af Amer >60 >60 mL/min   Anion gap 7 5 - 15  Lipase, blood  Result Value Ref Range   Lipase 33 22 - 51 U/L  CBC with Differential/Platelet  Result Value Ref Range   WBC 7.0 4.0 - 10.5 K/uL   RBC 5.22 4.22 - 5.81 MIL/uL   Hemoglobin 14.4 13.0 - 17.0 g/dL   HCT 09.6 04.5 - 40.9 %   MCV 87.5 78.0 - 100.0 fL   MCH 27.6 26.0 - 34.0 pg   MCHC 31.5 30.0 - 36.0 g/dL   RDW 81.1 91.4 - 78.2 %   Platelets 186 150 - 400 K/uL   Neutrophils Relative % 72 43 - 77 %   Neutro Abs 5.0 1.7 - 7.7 K/uL   Lymphocytes Relative 21 12 - 46 %    Lymphs Abs 1.5 0.7 - 4.0 K/uL   Monocytes Relative 6 3 - 12 %   Monocytes Absolute 0.4 0.1 - 1.0 K/uL   Eosinophils Relative 1 0 - 5 %   Eosinophils Absolute 0.1 0.0 - 0.7 K/uL   Basophils Relative 0 0 - 1 %   Basophils Absolute 0.0 0.0 - 0.1 K/uL     Imaging Review No results found.   EKG Interpretation None      MDM   Final diagnoses:  Abdominal pain    Patient from [place]-town  claims his GI doctor in the [place] area. Patient third visit this month for abdominal pain with nausea and vomiting. Patient claims that he has Crohn's disease. Plan was CT scan abdomen and pelvis and labs. Also planned on IV hydration however difficulty getting an IV patient was given IM fentanyl and Zofran for the nausea and vomiting. Patient given cystic and on more pain medicine. And refused to have the CT of the abdomen. Told the patient we could not really evaluate concern for Crohn's or complications of Crohn's without the CT. Patient of insisted on signing out AMA.  With the 3 visits here in July is questionable with the patient's doing in this area and also seems to be eliciting some drug-seeking behavior.    Vanetta Mulders, MD 02/21/15 [phone]

## 2015-02-21 NOTE — ED Notes (Addendum)
Pt states that he has vomited twice and has been having abd pain for the past 2 hours or so.  States had one episode of diarrhea.  Took a percocet for abd pain but vomited shortly after.

## 2015-02-22 ENCOUNTER — Encounter (HOSPITAL_COMMUNITY): Payer: Self-pay | Admitting: Emergency Medicine

## 2015-02-22 ENCOUNTER — Emergency Department (HOSPITAL_COMMUNITY): Payer: Medicaid Other

## 2015-02-22 ENCOUNTER — Emergency Department (HOSPITAL_COMMUNITY)
Admission: EM | Admit: 2015-02-22 | Discharge: 2015-02-22 | Disposition: A | Discharge status: Home / Self Care | Payer: Medicaid Other | Attending: Emergency Medicine | Admitting: Emergency Medicine

## 2015-02-22 DIAGNOSIS — Z79899 Other long term (current) drug therapy: Secondary | ICD-10-CM | POA: Insufficient documentation

## 2015-02-22 DIAGNOSIS — Z720 Tobacco use: Secondary | ICD-10-CM | POA: Insufficient documentation

## 2015-02-22 DIAGNOSIS — R1032 Left lower quadrant pain: Secondary | ICD-10-CM | POA: Diagnosis present

## 2015-02-22 DIAGNOSIS — K5090 Crohn's disease, unspecified, without complications: Secondary | ICD-10-CM | POA: Diagnosis not present

## 2015-02-22 DIAGNOSIS — R109 Unspecified abdominal pain: Secondary | ICD-10-CM

## 2015-02-22 MED ORDER — HYDROMORPHONE HCL 2 MG PO TABS
2.0000 mg | ORAL_TABLET | Freq: Once | ORAL | Status: AC
  Administered 2015-02-22: 2 mg via ORAL
  Filled 2015-02-22: qty 1

## 2015-02-22 MED ORDER — HYDROCODONE-ACETAMINOPHEN 5-325 MG PO TABS: 2.0000 | ORAL_TABLET | ORAL | Status: DC | PRN

## 2015-02-22 MED ORDER — PREDNISONE 20 MG PO TABS: 40.0000 mg | ORAL_TABLET | Freq: Every day | ORAL | Status: DC

## 2015-02-22 MED ORDER — PROMETHAZINE HCL 12.5 MG PO TABS
25.0000 mg | ORAL_TABLET | ORAL | Status: AC
  Administered 2015-02-22: 25 mg via ORAL
  Filled 2015-02-22: qty 2

## 2015-02-22 NOTE — ED Notes (Signed)
MD Miller at bedside. 

## 2015-02-22 NOTE — ED Notes (Signed)
Pt continuing to demand pain medications.  Reinforced with pt that he has been informed several times that the physician is aware but is not going to order medications till results of CT are reviewed.

## 2015-02-22 NOTE — ED Notes (Signed)
Pt drinking oral contrast at this time. 

## 2015-02-22 NOTE — ED Notes (Signed)
Pt continuously requesting pain medication. MD Hyacinth Meeker made aware. Per MD Hyacinth Meeker, no more pain medication to be given until CT scan results.

## 2015-02-22 NOTE — ED Provider Notes (Signed)
CSN: [id number]     Arrival date & time 02/22/15  1454 History   First MD Initiated Contact with Patient 02/22/15 1548     Chief Complaint  Patient presents with  . Abdominal Pain     (Consider location/radiation/quality/duration/timing/severity/associated sxs/prior Treatment) HPI Comments: The patient is a 39 year old male, he reports a history of Crohn's disease for many years, he has had treatment in the past with both medications including prednisone and surgery, his surgical operation was followed by a mesh abdominal wall repair, this became infected and had to be removed. He has not had any other bowel surgery other than that resection. He's had a cholecystectomy, he has expressed pain in his left lower quadrant and this is his fourth visit for this complaint in the recent past. He was seen here yesterday, he left AGAINST MEDICAL ADVICE saying that he was "scared of the CAT scan because of cancer". He denies having frequent imaging tests in the past. His labs from yesterday were reviewed, there was no leukocytosis, no anemia, no elect to light disturbances and no kidney failure. He reports that the pain is still present, left lower quadrant, states that it can be severe at times, comes and goes and is not associated with blood in the stools, diarrhea or difficulty urinating. He reports eating some soup today but he has also had a single episode of vomiting in the last 12 hours.  Patient is a 39 y.o. male presenting with abdominal pain. The history is provided by the patient.  Abdominal Pain   Past Medical History  Diagnosis Date  . Crohn disease    Past Surgical History  Procedure Laterality Date  . Cholecystectomy     No family history on file. History  Substance Use Topics  . Smoking status: Current Every Day Smoker -- 0.50 packs/day  . Smokeless tobacco: Not on file  . Alcohol Use: Yes     Comment: occ    Review of Systems  Gastrointestinal: Positive for abdominal pain.   All other systems reviewed and are negative.     Allergies  Toradol and Morphine and related  Home Medications   Prior to Admission medications   Medication Sig Start Date End Date Taking? Authorizing Provider  mesalamine (PENTASA) 250 MG CR capsule Take 250 mg by mouth 4 (four) times daily.    Yes Historical Provider, MD  ondansetron (ZOFRAN) 4 MG tablet Take 4 mg by mouth every 8 (eight) hours as needed for nausea or vomiting.  01/25/15  Yes Historical Provider, MD  oxyCODONE (ROXICODONE) 5 MG immediate release tablet Take 1 tablet by mouth every 6 (six) hours as needed for moderate pain.  01/25/15  Yes Historical Provider, MD  promethazine (PHENERGAN) 25 MG tablet Take 1 tablet (25 mg total) by mouth every 6 (six) hours as needed. Patient taking differently: Take 25 mg by mouth every 6 (six) hours as needed for nausea or vomiting.  02/13/15  Yes [name], MD  HYDROcodone-acetaminophen (NORCO/VICODIN) 5-325 MG per tablet Take 2 tablets by mouth every 4 (four) hours as needed. 02/22/15   Eber Hong, MD  HYDROcodone-acetaminophen (NORCO/VICODIN) 5-325 MG per tablet Take 2 tablets by mouth every 4 (four) hours as needed for moderate pain. 02/22/15   Eber Hong, MD  oxyCODONE-acetaminophen (PERCOCET/ROXICET) 5-325 MG per tablet Take 1-2 tablets by mouth every 6 (six) hours as needed. Patient not taking: Reported on 02/22/2015 02/13/15   [name], MD  predniSONE (DELTASONE) 20 MG tablet Take 2 tablets (40  mg total) by mouth daily. 02/22/15   Eber Hong, MD   BP 121/96 mmHg  Pulse 82  Temp(Src) 98.3 F (36.8 C)  Resp 18  Ht 5\' 8"  (1.727 m)  Wt 150 lb (68.04 kg)  BMI 22.81 kg/m2  SpO2 95% Physical Exam  Constitutional: He appears well-developed and well-nourished. No distress.  HENT:  Head: Normocephalic and atraumatic.  Mouth/Throat: Oropharynx is clear and moist. No oropharyngeal exudate.  Eyes: Conjunctivae and EOM are normal. Pupils are equal, round, and reactive to light.  Right eye exhibits no discharge. Left eye exhibits no discharge. No scleral icterus.  Neck: Normal range of motion. Neck supple. No JVD present. No thyromegaly present.  Cardiovascular: Normal rate, regular rhythm, normal heart sounds and intact distal pulses.  Exam reveals no gallop and no friction rub.   No murmur heard. Pulmonary/Chest: Effort normal and breath sounds normal. No respiratory distress. He has no wheezes. He has no rales.  Abdominal: Soft. Bowel sounds are normal. He exhibits no distension and no mass. There is tenderness (moderate tenderness in the left lower quadrant, no other abdominal tenderness, this is very soft, there is no hernias, no inguinal hernias, no ventral wall hernias).  Musculoskeletal: Normal range of motion. He exhibits no edema or tenderness.  Lymphadenopathy:    He has no cervical adenopathy.  Neurological: He is alert. Coordination normal.  Skin: Skin is warm and dry. No rash noted. No erythema.  Psychiatric: He has a normal mood and affect. His behavior is normal.  Nursing note and vitals reviewed.   ED Course  Procedures (including critical care time) Labs Review Labs Reviewed - No data to display  Imaging Review Ct Abdomen Pelvis Wo Contrast  02/22/2015   CLINICAL DATA:  Abdominal pain, nausea and vomiting since yesterday, history Crohn's disease, smoking  EXAM: CT ABDOMEN AND PELVIS WITHOUT CONTRAST  TECHNIQUE: Multidetector CT imaging of the abdomen and pelvis was performed following the standard protocol without IV contrast. Sagittal and coronal MPR images reconstructed from axial data set. Oral contrast was administered  COMPARISON:  09/19/2005  FINDINGS: Lung bases clear.  Gallbladder surgically absent.  Within limits of a nonenhanced exam, liver, spleen, pancreas, kidneys, and adrenal glands unremarkable.  Normal appearing bladder and ureters.  Suboptimal volume of ingested contrast material, with only a few small bowel loops demonstrating partial  opacification.  Bowel anastomosis noted in the RIGHT mid abdomen.  Significant bowel wall thickening of a small bowel loop in the RIGHT mid abdomen compatible with Crohn's disease.  No gross evidence of bowel obstruction or perforation.  Stomach and remaining bowel loops grossly unremarkable for technique.  Scattered normal size mesenteric lymph nodes.  No mass, adenopathy, free air or free fluid seen on limited exam.  Bones unremarkable.  IMPRESSION: Segmental small bowel wall thickening in RIGHT mid abdomen consistent with Crohn's disease.   Electronically Signed   By: Ulyses Southward M.D.   On: 02/22/2015 18:36     EKG Interpretation None      MDM   Final diagnoses:  Abdominal pain  Crohn's disease, without complications    The patient has unremarkable vital signs, labs from yesterday were normal, CT scan was to be performed before the patient left AGAINST MEDICAL ADVICE. We'll complete workup with a CT scan at this time, hold on labs, the patient is specifically requesting Dilaudid and Phenergan by name.    Pt has CT confirms short segment of inflammatory bowel - no ocompilcations - pt improve d with  meds.  Meds given in ED:  Medications  HYDROmorphone (DILAUDID) tablet 2 mg (2 mg Oral Given 02/22/15 1636)  promethazine (PHENERGAN) tablet 25 mg (25 mg Oral Given 02/22/15 1636)    New Prescriptions   HYDROCODONE-ACETAMINOPHEN (NORCO/VICODIN) 5-325 MG PER TABLET    Take 2 tablets by mouth every 4 (four) hours as needed.   HYDROCODONE-ACETAMINOPHEN (NORCO/VICODIN) 5-325 MG PER TABLET    Take 2 tablets by mouth every 4 (four) hours as needed for moderate pain.   PREDNISONE (DELTASONE) 20 MG TABLET    Take 2 tablets (40 mg total) by mouth daily.      Eber Hong, MD 02/22/15 2034

## 2015-02-22 NOTE — ED Notes (Signed)
Pt c/o abd pain/n/v since yesterday. Pt seen in ED yesterday, left ama.

## 2015-02-22 NOTE — Discharge Instructions (Signed)

## 2015-02-26 ENCOUNTER — Emergency Department (HOSPITAL_COMMUNITY): Payer: Medicaid Other

## 2015-02-26 ENCOUNTER — Encounter (HOSPITAL_COMMUNITY): Payer: Self-pay | Admitting: *Deleted

## 2015-02-26 ENCOUNTER — Emergency Department (HOSPITAL_COMMUNITY)
Admission: EM | Admit: 2015-02-26 | Discharge: 2015-02-26 | Disposition: A | Discharge status: Home / Self Care | Payer: Medicaid Other | Attending: Emergency Medicine | Admitting: Emergency Medicine

## 2015-02-26 DIAGNOSIS — R109 Unspecified abdominal pain: Secondary | ICD-10-CM | POA: Diagnosis present

## 2015-02-26 DIAGNOSIS — Z9049 Acquired absence of other specified parts of digestive tract: Secondary | ICD-10-CM | POA: Insufficient documentation

## 2015-02-26 DIAGNOSIS — Z720 Tobacco use: Secondary | ICD-10-CM | POA: Diagnosis not present

## 2015-02-26 DIAGNOSIS — Z79899 Other long term (current) drug therapy: Secondary | ICD-10-CM | POA: Diagnosis not present

## 2015-02-26 DIAGNOSIS — K50119 Crohn's disease of large intestine with unspecified complications: Secondary | ICD-10-CM

## 2015-02-26 DIAGNOSIS — K5010 Crohn's disease of large intestine without complications: Secondary | ICD-10-CM | POA: Diagnosis not present

## 2015-02-26 LAB — COMPREHENSIVE METABOLIC PANEL
ALBUMIN: 2.6 g/dL — AB (ref 3.5–5.0)
ALK PHOS: 74 U/L (ref 38–126)
ALT: 12 U/L — AB (ref 17–63)
ANION GAP: 6 (ref 5–15)
AST: 14 U/L — ABNORMAL LOW (ref 15–41)
BUN: 10 mg/dL (ref 6–20)
CHLORIDE: 109 mmol/L (ref 101–111)
CO2: 25 mmol/L (ref 22–32)
Calcium: 7.8 mg/dL — ABNORMAL LOW (ref 8.9–10.3)
Creatinine, Ser: 1.05 mg/dL (ref 0.61–1.24)
GFR calc Af Amer: >60 mL/min (ref >60)
Glucose, Bld: 138 mg/dL — ABNORMAL HIGH (ref 65–99)
Potassium: 3.2 mmol/L — ABNORMAL LOW (ref 3.5–5.1)
SODIUM: 140 mmol/L (ref 135–145)
TOTAL PROTEIN: 5 g/dL — AB (ref 6.5–8.1)
Total Bilirubin: 0.3 mg/dL (ref 0.3–1.2)

## 2015-02-26 LAB — URINALYSIS, ROUTINE W REFLEX MICROSCOPIC
BILIRUBIN URINE: NEGATIVE
Glucose, UA: NEGATIVE mg/dL
Hgb urine dipstick: NEGATIVE
Leukocytes, UA: NEGATIVE
Nitrite: NEGATIVE
PROTEIN: NEGATIVE mg/dL
Specific Gravity, Urine: 1.025 (ref 1.005–1.030)
Urobilinogen, UA: 0.2 mg/dL (ref 0.0–1.0)
pH: 6.5 (ref 5.0–8.0)

## 2015-02-26 LAB — CBC
HCT: 38.7 % — ABNORMAL LOW (ref 39.0–52.0)
HEMOGLOBIN: 12.5 g/dL — AB (ref 13.0–17.0)
MCH: 27.8 pg (ref 26.0–34.0)
MCHC: 32.3 g/dL (ref 30.0–36.0)
MCV: 86.2 fL (ref 78.0–100.0)
Platelets: 180 10*3/uL (ref 150–400)
RBC: 4.49 MIL/uL (ref 4.22–5.81)
RDW: 14.9 % (ref 11.5–15.5)
WBC: 6.7 10*3/uL (ref 4.0–10.5)

## 2015-02-26 LAB — I-STAT CG4 LACTIC ACID, ED
Lactic Acid, Venous: 2.25 mmol/L (ref 0.5–2.0)
Lactic Acid, Venous: 0.8 mmol/L (ref 0.5–2.0)

## 2015-02-26 LAB — LIPASE, BLOOD: LIPASE: 25 U/L (ref 22–51)

## 2015-02-26 MED ORDER — HYDROMORPHONE HCL 1 MG/ML IJ SOLN
1.0000 mg | Freq: Once | INTRAMUSCULAR | Status: AC
  Administered 2015-02-26: 1 mg via INTRAVENOUS
  Filled 2015-02-26: qty 1

## 2015-02-26 MED ORDER — PROMETHAZINE HCL 25 MG/ML IJ SOLN
25.0000 mg | Freq: Once | INTRAMUSCULAR | Status: AC
  Administered 2015-02-26: 25 mg via INTRAVENOUS
  Filled 2015-02-26: qty 1

## 2015-02-26 MED ORDER — PREDNISONE 10 MG PO TABS: 20.0000 mg | ORAL_TABLET | Freq: Every day | ORAL | Status: DC

## 2015-02-26 MED ORDER — ONDANSETRON HCL 4 MG PO TABS: 4.0000 mg | ORAL_TABLET | Freq: Four times a day (QID) | ORAL | Status: AC

## 2015-02-26 MED ORDER — SODIUM CHLORIDE 0.9 % IV BOLUS (SEPSIS)
1000.0000 mL | Freq: Once | INTRAVENOUS | Status: AC
  Administered 2015-02-26: 1000 mL via INTRAVENOUS

## 2015-02-26 NOTE — ED Notes (Signed)
Pt attempting to provide urine specimen at this time.  

## 2015-02-26 NOTE — Discharge Instructions (Signed)
Crohn Disease Take the steroids as prescribed. Followup with your stomach doctor. The ED cannot continue to provide you with pain medication. Return to the ED if you develop new or worsening symptoms. Crohn disease is a long-term (chronic) soreness and redness (inflammation) of the intestines (bowel). It can affect any portion of the digestive tract, from the mouth to the anus. It can also cause problems outside the digestive tract. Crohn disease is closely related to a disease called ulcerative colitis (together, these two diseases are called inflammatory bowel disease).  CAUSES  The cause of Crohn disease is not known. One [place] Bush is that, in an easily affected person, the immune system is triggered to attack the body's own digestive tissue. Crohn disease runs in families. It seems to be more common in certain geographic areas and amongst certain races. There are no clear-cut dietary causes.  SYMPTOMS  Crohn disease can cause many different symptoms since it can affect many different parts of the body. Symptoms include:  Fatigue.  Weight loss.  Chronic diarrhea, sometime bloody.  Abdominal pain and cramps.  Fever.  Ulcers or canker sores in the mouth or rectum.  Anemia (low red blood cells).  Arthritis, skin problems, and eye problems may occur. Complications of Crohn disease can include:  Series of holes (perforation) of the bowel.  Portions of the intestines sticking to each other (adhesions).  Obstruction of the bowel.  Fistula formation, typically in the rectal area but also in other areas. A fistula is an opening between the bowels and the outside, or between the bowels and another organ.  A painful crack in the mucous membrane of the anus (rectal fissure). DIAGNOSIS  Your caregiver may suspect Crohn disease based on your symptoms and an exam. Blood tests may confirm that there is a problem. You may be asked to submit a stool specimen for examination. X-rays and CT scans may  be necessary. Ultimately, the diagnosis is usually made after a procedure that uses a flexible tube that is inserted via your mouth or your anus. This is done under sedation and is called either an upper endoscopy or colonoscopy. With these tests, the specialist can take tiny tissue samples and remove them from the inside of the bowel (biopsy). Examination of this biopsy tissue under a microscope can reveal Crohn disease as the cause of your symptoms. Due to the many different forms that Crohn disease can take, symptoms may be present for several years before a diagnosis is made. TREATMENT  Medications are often used to decrease inflammation and control the immune system. These include medicines related to aspirin, steroid medications, and newer and stronger medications to slow down the immune system. Some medications may be used as suppositories or enemas. A number of other medications are used or have been studied. Your caregiver will make specific recommendations. HOME CARE INSTRUCTIONS   Symptoms such as diarrhea can be controlled with medications. Avoid foods that have a laxative effect such as fresh fruit, vegetables, and dairy products. During flare-ups, you can rest your bowel by refraining from solid foods. Drink clear liquids frequently during the day. (Electrolyte or rehydrating fluids are best. Your caregiver can help you with suggestions.) Drink often to prevent loss of body fluids (dehydration). When diarrhea has cleared, eat small meals and more frequently. Avoid food additives and stimulants such as caffeine (coffee, tea, or chocolate). Enzyme supplements may help if you develop intolerance to a sugar in dairy products (lactose). Ask your caregiver or dietitian about specific dietary  instructions.  Try to maintain a positive attitude. Learn relaxation techniques such as self-hypnosis, mental imaging, and muscle relaxation.  If possible, avoid stresses which can aggravate your  condition.  Exercise regularly.  Follow your diet.  Always get plenty of rest. SEEK MEDICAL CARE IF:   Your symptoms fail to improve after a week or two of new treatment.  You experience continued weight loss.  You have ongoing cramps or loose bowels.  You develop a new skin rash, skin sores, or eye problems. SEEK IMMEDIATE MEDICAL CARE IF:   You have worsening of your symptoms or develop new symptoms.  You have a fever.  You develop bloody diarrhea.  You develop severe abdominal pain. MAKE SURE YOU:   Understand these instructions.  Will watch your condition.  Will get help right away if you are not doing well or get worse. Document Released: 04/30/2005 Document Revised: 12/05/2013 Document Reviewed: 03/29/2007 [facility] Patient Information 2015 [place], [place]. This information is not intended to replace advice given to you by your health care provider. Make sure you discuss any questions you have with your health care provider.

## 2015-02-26 NOTE — ED Notes (Signed)
MD at bedside. 

## 2015-02-26 NOTE — ED Notes (Signed)
Pt with continued abd pain with N/V/D, pt seen here for same last time 4 days ago

## 2015-02-26 NOTE — ED Notes (Addendum)
Provided patient with UA cup and wipe to obtain sample. Verbalizes understanding.

## 2015-02-26 NOTE — ED Provider Notes (Addendum)
History  This chart was scribed for Scott Octave, MD by Karle Plumber, ED Scribe. This patient was seen in room APA05/APA05 and the patient's care was started at 12:36 PM.  Chief Complaint  Patient presents with  . Abdominal Pain   The history is provided by the patient and medical records. No language interpreter was used.    HPI Comments:  Scott Keith is a 39 y.o. male with PMHx of Crohn's disease who presents to the Emergency Department complaining of abdominal pain that began earlier today. He was seen here four days ago for the same symptoms, diagnosed with a Crohn's exacerbation, prescribed steroids, nausea medication and Vicodin with temporary relief. He states he is normally on Prednisone 10 mg daily but it was increased to 20 mg four days ago here. He reports the symptoms have now returned. He reports associated diarrhea (2 episodes), vomiting (1 episode) and nausea. He denies modifying factors. Denies fever, chills, CP or SOB. Reports h/o cholecystectomy and mesh abdominal wall repair and removal (last abdominal surgery 6 years ago). He reports allergies to Morphine and Toradol. Pt is from out of town and will be here for another 1-2 weeks.  Past Medical History  Diagnosis Date  . Crohn disease    Past Surgical History  Procedure Laterality Date  . Cholecystectomy     History reviewed. No pertinent family history. History  Substance Use Topics  . Smoking status: Current Every Day Smoker -- 0.50 packs/day    Types: Cigarettes  . Smokeless tobacco: Not on file  . Alcohol Use: Yes     Comment: occ    Review of Systems  A complete 10 system review of systems was obtained and all systems are negative except as noted in the HPI and PMH.   Allergies  Toradol and Morphine and related  Home Medications   Prior to Admission medications   Medication Sig Start Date End Date Taking? Authorizing Provider  HYDROcodone-acetaminophen (NORCO/VICODIN) 5-325 MG per tablet Take  2 tablets by mouth every 4 (four) hours as needed for moderate pain. 02/22/15  Yes Eber Hong, MD  mesalamine (PENTASA) 250 MG CR capsule Take 250 mg by mouth 4 (four) times daily.    Yes Historical Provider, MD  HYDROcodone-acetaminophen (NORCO/VICODIN) 5-325 MG per tablet Take 2 tablets by mouth every 4 (four) hours as needed. Patient not taking: Reported on 02/26/2015 02/22/15   Eber Hong, MD  ondansetron (ZOFRAN) 4 MG tablet Take 1 tablet (4 mg total) by mouth every 6 (six) hours. 02/26/15   Scott Octave, MD  oxyCODONE-acetaminophen (PERCOCET/ROXICET) 5-325 MG per tablet Take 1-2 tablets by mouth every 6 (six) hours as needed. Patient not taking: Reported on 02/22/2015 02/13/15   [name], MD  predniSONE (DELTASONE) 10 MG tablet Take 2 tablets (20 mg total) by mouth daily with breakfast. Take 20 mg daily for 2 weeks then 10 mg daily for 2 weeks 02/26/15   Scott Octave, MD  promethazine (PHENERGAN) 25 MG tablet Take 1 tablet (25 mg total) by mouth every 6 (six) hours as needed. Patient taking differently: Take 25 mg by mouth every 6 (six) hours as needed for nausea or vomiting.  02/13/15   [name], MD   Triage Vitals: BP 117/75 mmHg  Pulse 105  Temp(Src) 98.2 F (36.8 C) (Oral)  Resp 18  Ht 5\' 8"  (1.727 m)  Wt 150 lb (68.04 kg)  BMI 22.81 kg/m2  SpO2 98% Physical Exam  Constitutional: He is oriented to person, place, and  time. He appears well-developed and well-nourished. No distress.  HENT:  Head: Normocephalic and atraumatic.  Mouth/Throat: Oropharynx is clear and moist. No oropharyngeal exudate.  Eyes: Conjunctivae and EOM are normal. Pupils are equal, round, and reactive to light.  Neck: Normal range of motion. Neck supple.  No meningismus.  Cardiovascular: Normal rate, regular rhythm, normal heart sounds and intact distal pulses.   No murmur heard. Pulmonary/Chest: Effort normal and breath sounds normal. No respiratory distress.  Abdominal: Soft. There is no tenderness.  There is no rebound and no guarding.  Midline surgical scar with deformity of abdominal wall. Soft with right-sided tenderness.  Genitourinary:  No testicular tenderness.  Musculoskeletal: Normal range of motion. He exhibits no edema or tenderness.  Neurological: He is alert and oriented to person, place, and time. No cranial nerve deficit. He exhibits normal muscle tone. Coordination normal.  No ataxia on finger to nose bilaterally. No pronator drift. 5/5 strength throughout. CN 2-12 intact. Negative Romberg. Equal grip strength. Sensation intact. Gait is normal.   Skin: Skin is warm.  Psychiatric: He has a normal mood and affect. His behavior is normal.  Nursing note and vitals reviewed.   ED Course  Procedures (including critical care time) DIAGNOSTIC STUDIES: Oxygen Saturation is 98% on RA, normal by my interpretation.   COORDINATION OF CARE: 12:42 PM- Will order labs, pain and nausea medication. Pt verbalizes understanding and agrees to plan.  Medications  HYDROmorphone (DILAUDID) injection 1 mg (1 mg Intravenous Given 02/26/15 1300)  promethazine (PHENERGAN) injection 25 mg (25 mg Intravenous Given 02/26/15 1300)  sodium chloride 0.9 % bolus 1,000 mL (0 mLs Intravenous Stopped 02/26/15 1546)  sodium chloride 0.9 % bolus 1,000 mL (0 mLs Intravenous Stopped 02/26/15 1630)    Labs Review Labs Reviewed  COMPREHENSIVE METABOLIC PANEL - Abnormal; Notable for the following:    Potassium 3.2 (*)    Glucose, Bld 138 (*)    Calcium 7.8 (*)    Total Protein 5.0 (*)    Albumin 2.6 (*)    AST 14 (*)    ALT 12 (*)    All other components within normal limits  CBC - Abnormal; Notable for the following:    Hemoglobin 12.5 (*)    HCT 38.7 (*)    All other components within normal limits  URINALYSIS, ROUTINE W REFLEX MICROSCOPIC (NOT AT [facility]) - Abnormal; Notable for the following:    Ketones, ur TRACE (*)    All other components within normal limits  I-STAT CG4 LACTIC ACID, ED -  Abnormal; Notable for the following:    Lactic Acid, Venous 2.25 (*)    All other components within normal limits  LIPASE, BLOOD  I-STAT CG4 LACTIC ACID, ED  I-STAT CG4 LACTIC ACID, ED    Imaging Review Dg Abd Acute W/chest  02/26/2015   CLINICAL DATA:  Abdominal pain  EXAM: DG ABDOMEN ACUTE W/ 1V CHEST  COMPARISON:  02/22/2015  FINDINGS: Nonspecific air-fluid levels are scattered across the abdomen without disproportionate dilatation of small bowel. There is no free intraperitoneal gas. There is no pneumatosis. Postoperative changes are noted.  IMPRESSION: Nonspecific air-fluid levels without evidence of obstruction.   Electronically Signed   By: Jolaine Click M.D.   On: 02/26/2015 15:14     EKG Interpretation None      MDM   Final diagnoses:  Abdominal pain, unspecified abdominal location  Crohn's colitis, unspecified complication   patient presents with abdominal pain with nausea, vomiting and diarrhea onset this morning.  History of Crohn's disease. CT scan performed 4 days ago showed small area of active disease. Currently on 20 mg of prednisone.  History of Crohn's disease with ongoing abdominal pain since visit 4 days ago.  Mild lactate elevation. 2.2. Labs otherwise at baseline. No leukocytosis.  Discussed with Dr. Darrick Penna. She recommended longer steroids taper of 20 mg for 2 weeks and then 10 mg for 2 weeks. Follow up with his gastroenterologist.  On reassessment his abdomen is soft and nontender. Patient with no vomiting. Repeat lactate has cleared. Patient has no vomiting. He is not nauseated. He is tolerating by mouth. Will prolong prednisone course have follow-up with his gastroenterologist. Return precautions discussed.  Narcotic database shows frequent prescriptions of narcotics from multiple providers. Last received 20 oxycodone on July 15. Also received 10 oxycodone July 7.   patient upset that he is not being prescribed additional narcotics. Discussed with patient  that he's had multiple recent narcotic prescriptions from the emergency department he needs to follow up with his primary doctor for additional pain control.  BP 109/78 mmHg  Pulse 78  Temp(Src) 98.2 F (36.8 C) (Oral)  Resp 16  Ht 5\' 8"  (1.727 m)  Wt 150 lb (68.04 kg)  BMI 22.81 kg/m2  SpO2 100%   I personally performed the services described in this documentation, which was scribed in my presence. The recorded information has been reviewed and is accurate.    Scott Octave, MD 02/26/15 4497  Scott Octave, MD 02/26/15 2136

## 2015-02-26 NOTE — ED Notes (Signed)
Patient eating ice chips. Denies nausea at this time. Tolerating well.

## 2015-03-05 ENCOUNTER — Emergency Department (HOSPITAL_COMMUNITY)
Admission: EM | Admit: 2015-03-05 | Discharge: 2015-03-05 | Disposition: A | Discharge status: Home / Self Care | Payer: Medicaid Other | Attending: Emergency Medicine | Admitting: Emergency Medicine

## 2015-03-05 ENCOUNTER — Emergency Department (HOSPITAL_COMMUNITY): Payer: Medicaid Other

## 2015-03-05 ENCOUNTER — Encounter (HOSPITAL_COMMUNITY): Payer: Self-pay

## 2015-03-05 DIAGNOSIS — Z79899 Other long term (current) drug therapy: Secondary | ICD-10-CM | POA: Insufficient documentation

## 2015-03-05 DIAGNOSIS — Y998 Other external cause status: Secondary | ICD-10-CM | POA: Diagnosis not present

## 2015-03-05 DIAGNOSIS — Y9389 Activity, other specified: Secondary | ICD-10-CM | POA: Diagnosis not present

## 2015-03-05 DIAGNOSIS — S62614A Displaced fracture of proximal phalanx of right ring finger, initial encounter for closed fracture: Secondary | ICD-10-CM | POA: Insufficient documentation

## 2015-03-05 DIAGNOSIS — K5090 Crohn's disease, unspecified, without complications: Secondary | ICD-10-CM | POA: Diagnosis not present

## 2015-03-05 DIAGNOSIS — Y9289 Other specified places as the place of occurrence of the external cause: Secondary | ICD-10-CM | POA: Diagnosis not present

## 2015-03-05 DIAGNOSIS — Z720 Tobacco use: Secondary | ICD-10-CM | POA: Insufficient documentation

## 2015-03-05 DIAGNOSIS — S62604A Fracture of unspecified phalanx of right ring finger, initial encounter for closed fracture: Secondary | ICD-10-CM

## 2015-03-05 DIAGNOSIS — S6991XA Unspecified injury of right wrist, hand and finger(s), initial encounter: Secondary | ICD-10-CM | POA: Diagnosis present

## 2015-03-05 MED ORDER — HYDROCODONE-ACETAMINOPHEN 7.5-325 MG PO TABS: 1.0000 | ORAL_TABLET | ORAL | Status: AC | PRN

## 2015-03-05 MED ORDER — PROMETHAZINE HCL 12.5 MG PO TABS
12.5000 mg | ORAL_TABLET | Freq: Once | ORAL | Status: AC
  Administered 2015-03-05: 12.5 mg via ORAL
  Filled 2015-03-05: qty 1

## 2015-03-05 MED ORDER — OXYCODONE-ACETAMINOPHEN 5-325 MG PO TABS: 1.0000 | ORAL_TABLET | ORAL | Status: AC | PRN

## 2015-03-05 MED ORDER — HYDROCODONE-ACETAMINOPHEN 5-325 MG PO TABS
2.0000 | ORAL_TABLET | Freq: Once | ORAL | Status: AC
  Administered 2015-03-05: 2 via ORAL
  Filled 2015-03-05: qty 2
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
## 2015-03-05 NOTE — ED Notes (Signed)
Patient given discharge instruction, verbalized understand. Patient ambulatory out of the department.  
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
## 2015-03-05 NOTE — Discharge Instructions (Signed)
You have a broken bone in your ring finger. Please see Dr. Hilda Lias, or the orthopedic of your choice for evaluation and management of this. Please do not get your splint wet. Please apply ice, and keep her hand elevated above your heart is much as possible. Finger Fracture A finger fracture is when one or more bones in the finger break.  HOME CARE   Wear the splint, tape, or cast as long as told by your doctor.  Keep your fingers in the position your doctor tell you to.  Raise (elevate) the injured area above the level of the heart.  Only take medicine as told by your doctor.  Put ice on the injured area.  Put ice in a plastic bag.  Place a towel between the skin and the bag.  Leave the ice on for 15-20 minutes, 03-04 times a day.  Follow up with your doctor.  Ask what exercises you can do when the splint comes off. GET HELP RIGHT AWAY IF:   The fingernails are white or bluish.  You have pain not helped by medicine.  You cannot move your fingertips.  You lose feeling (numbness) in the injured finger(s). MAKE SURE YOU:   Understand these instructions.  Will watch this condition.  Will get help right away if you are not doing well or get worse. Document Released: 01/07/2008 Document Revised: 10/13/2011 Document Reviewed: 01/07/2008 [facility] Patient Information 2015 [place], [place]. This information is not intended to replace advice given to you by your health care provider. Make sure you discuss any questions you have with your health care provider.

## 2015-03-21 ENCOUNTER — Emergency Department (HOSPITAL_COMMUNITY)
Admission: EM | Admit: 2015-03-21 | Discharge: 2015-03-22 | Disposition: A | Discharge status: Home / Self Care | Payer: Medicaid Other | Attending: Emergency Medicine | Admitting: Emergency Medicine

## 2015-03-21 ENCOUNTER — Encounter (HOSPITAL_COMMUNITY): Payer: Self-pay | Admitting: Emergency Medicine

## 2015-03-21 DIAGNOSIS — W228XXD Striking against or struck by other objects, subsequent encounter: Secondary | ICD-10-CM | POA: Insufficient documentation

## 2015-03-21 DIAGNOSIS — S62609K Fracture of unspecified phalanx of unspecified finger, subsequent encounter for fracture with nonunion: Secondary | ICD-10-CM

## 2015-03-21 DIAGNOSIS — Z720 Tobacco use: Secondary | ICD-10-CM | POA: Insufficient documentation

## 2015-03-21 DIAGNOSIS — K5090 Crohn's disease, unspecified, without complications: Secondary | ICD-10-CM | POA: Insufficient documentation

## 2015-03-21 DIAGNOSIS — Z79899 Other long term (current) drug therapy: Secondary | ICD-10-CM | POA: Diagnosis not present

## 2015-03-21 DIAGNOSIS — S62624K Displaced fracture of medial phalanx of right ring finger, subsequent encounter for fracture with nonunion: Secondary | ICD-10-CM | POA: Insufficient documentation

## 2015-03-21 DIAGNOSIS — R1011 Right upper quadrant pain: Secondary | ICD-10-CM | POA: Diagnosis present

## 2015-03-21 LAB — COMPREHENSIVE METABOLIC PANEL
ALBUMIN: 3 g/dL — AB (ref 3.5–5.0)
ALT: 19 U/L (ref 17–63)
AST: 18 U/L (ref 15–41)
Alkaline Phosphatase: 76 U/L (ref 38–126)
Anion gap: 7 (ref 5–15)
BUN: 12 mg/dL (ref 6–20)
CHLORIDE: 109 mmol/L (ref 101–111)
CO2: 25 mmol/L (ref 22–32)
Calcium: 8.4 mg/dL — ABNORMAL LOW (ref 8.9–10.3)
Creatinine, Ser: 1.38 mg/dL — ABNORMAL HIGH (ref 0.61–1.24)
GFR calc Af Amer: >60 mL/min (ref >60)
GFR calc non Af Amer: >60 mL/min (ref >60)
Glucose, Bld: 99 mg/dL (ref 65–99)
POTASSIUM: 3.6 mmol/L (ref 3.5–5.1)
Sodium: 141 mmol/L (ref 135–145)
Total Bilirubin: 0.6 mg/dL (ref 0.3–1.2)
Total Protein: 5.7 g/dL — ABNORMAL LOW (ref 6.5–8.1)

## 2015-03-21 LAB — CBC
HEMATOCRIT: 40.3 % (ref 39.0–52.0)
Hemoglobin: 12.9 g/dL — ABNORMAL LOW (ref 13.0–17.0)
MCH: 28.1 pg (ref 26.0–34.0)
MCHC: 32 g/dL (ref 30.0–36.0)
MCV: 87.8 fL (ref 78.0–100.0)
Platelets: 222 10*3/uL (ref 150–400)
RBC: 4.59 MIL/uL (ref 4.22–5.81)
RDW: 15.4 % (ref 11.5–15.5)
WBC: 10 10*3/uL (ref 4.0–10.5)

## 2015-03-21 LAB — URINALYSIS, ROUTINE W REFLEX MICROSCOPIC
BILIRUBIN URINE: NEGATIVE
GLUCOSE, UA: NEGATIVE mg/dL
HGB URINE DIPSTICK: NEGATIVE
Ketones, ur: 15 mg/dL — AB
Nitrite: NEGATIVE
PH: 6 (ref 5.0–8.0)
Protein, ur: NEGATIVE mg/dL
SPECIFIC GRAVITY, URINE: 1.026 (ref 1.005–1.030)
Urobilinogen, UA: 0.2 mg/dL (ref 0.0–1.0)

## 2015-03-21 LAB — URINE MICROSCOPIC-ADD ON

## 2015-03-21 LAB — LIPASE, BLOOD: Lipase: 42 U/L (ref 22–51)

## 2015-03-21 NOTE — ED Notes (Signed)
Pt c/o lower abdominal pain onset an hour prior to arrival associated with NV and loose stools. Hx of crohns disease. Bowel sounds active. Pt also c/o R middle finger swelling and pain x2 days after punching a wall; cms intact, swelling noted.

## 2015-03-21 NOTE — ED Notes (Signed)
C/o L lower abd pain with nausea, vomiting, and diarrhea x 1 1/2 hour.  History of crohn's disease.

## 2015-03-22 ENCOUNTER — Emergency Department (HOSPITAL_COMMUNITY): Payer: Medicaid Other

## 2015-03-22 MED ORDER — ONDANSETRON 4 MG PO TBDP
8.0000 mg | ORAL_TABLET | Freq: Once | ORAL | Status: AC
  Administered 2015-03-22: 8 mg via ORAL
  Filled 2015-03-22: qty 2

## 2015-03-22 MED ORDER — HYDROMORPHONE HCL 1 MG/ML IJ SOLN
2.0000 mg | Freq: Once | INTRAMUSCULAR | Status: AC
  Administered 2015-03-22: 2 mg via INTRAMUSCULAR
  Filled 2015-03-22: qty 2

## 2015-03-22 MED ORDER — OXYCODONE-ACETAMINOPHEN 5-325 MG PO TABS
1.0000 | ORAL_TABLET | Freq: Once | ORAL | Status: AC
  Administered 2015-03-22: 1 via ORAL
  Filled 2015-03-22: qty 1

## 2015-03-22 MED ORDER — HYDROCODONE-ACETAMINOPHEN 5-325 MG PO TABS
2.0000 | ORAL_TABLET | Freq: Once | ORAL | Status: DC
Start: 2015-03-22 — End: 2015-03-22
  Filled 2015-03-22: qty 2

## 2015-03-22 MED ORDER — LIDOCAINE HCL 2 % IJ SOLN
5.0000 mL | Freq: Once | INTRAMUSCULAR | Status: DC
  Filled 2015-03-22: qty 20

## 2015-03-22 NOTE — ED Notes (Signed)
Lidocaine at bedside; pt requesting to have finger removed from ortho device

## 2015-03-22 NOTE — ED Notes (Signed)
Ortho tech at bedside 

## 2015-03-22 NOTE — ED Notes (Signed)
Ortho paged for finger splint   

## 2015-03-22 NOTE — Discharge Instructions (Signed)
See the hand surgeon as requested at your earliest convenience.   Crohn Disease Crohn disease is a long-term (chronic) soreness and redness (inflammation) of the intestines (bowel). It can affect any portion of the digestive tract, from the mouth to the anus. It can also cause problems outside the digestive tract. Crohn disease is closely related to a disease called ulcerative colitis (together, these two diseases are called inflammatory bowel disease).  CAUSES  The cause of Crohn disease is not known. One Nelva Bush is that, in an easily affected person, the immune system is triggered to attack the body's own digestive tissue. Crohn disease runs in families. It seems to be more common in certain geographic areas and amongst certain races. There are no clear-cut dietary causes.  SYMPTOMS  Crohn disease can cause many different symptoms since it can affect many different parts of the body. Symptoms include:  Fatigue.  Weight loss.  Chronic diarrhea, sometime bloody.  Abdominal pain and cramps.  Fever.  Ulcers or canker sores in the mouth or rectum.  Anemia (low red blood cells).  Arthritis, skin problems, and eye problems may occur. Complications of Crohn disease can include:  Series of holes (perforation) of the bowel.  Portions of the intestines sticking to each other (adhesions).  Obstruction of the bowel.  Fistula formation, typically in the rectal area but also in other areas. A fistula is an opening between the bowels and the outside, or between the bowels and another organ.  A painful crack in the mucous membrane of the anus (rectal fissure). DIAGNOSIS  Your caregiver may suspect Crohn disease based on your symptoms and an exam. Blood tests may confirm that there is a problem. You may be asked to submit a stool specimen for examination. X-rays and CT scans may be necessary. Ultimately, the diagnosis is usually made after a procedure that uses a flexible tube that is inserted  via your mouth or your anus. This is done under sedation and is called either an upper endoscopy or colonoscopy. With these tests, the specialist can take tiny tissue samples and remove them from the inside of the bowel (biopsy). Examination of this biopsy tissue under a microscope can reveal Crohn disease as the cause of your symptoms. Due to the many different forms that Crohn disease can take, symptoms may be present for several years before a diagnosis is made. TREATMENT  Medications are often used to decrease inflammation and control the immune system. These include medicines related to aspirin, steroid medications, and newer and stronger medications to slow down the immune system. Some medications may be used as suppositories or enemas. A number of other medications are used or have been studied. Your caregiver will make specific recommendations. HOME CARE INSTRUCTIONS   Symptoms such as diarrhea can be controlled with medications. Avoid foods that have a laxative effect such as fresh fruit, vegetables, and dairy products. During flare-ups, you can rest your bowel by refraining from solid foods. Drink clear liquids frequently during the day. (Electrolyte or rehydrating fluids are best. Your caregiver can help you with suggestions.) Drink often to prevent loss of body fluids (dehydration). When diarrhea has cleared, eat small meals and more frequently. Avoid food additives and stimulants such as caffeine (coffee, tea, or chocolate). Enzyme supplements may help if you develop intolerance to a sugar in dairy products (lactose). Ask your caregiver or dietitian about specific dietary instructions.  Try to maintain a positive attitude. Learn relaxation techniques such as self-hypnosis, mental imaging, and muscle relaxation.  If possible, avoid stresses which can aggravate your condition.  Exercise regularly.  Follow your diet.  Always get plenty of rest. SEEK MEDICAL CARE IF:   Your symptoms fail  to improve after a week or two of new treatment.  You experience continued weight loss.  You have ongoing cramps or loose bowels.  You develop a new skin rash, skin sores, or eye problems. SEEK IMMEDIATE MEDICAL CARE IF:   You have worsening of your symptoms or develop new symptoms.  You have a fever.  You develop bloody diarrhea.  You develop severe abdominal pain. MAKE SURE YOU:   Understand these instructions.  Will watch your condition.  Will get help right away if you are not doing well or get worse. Document Released: 04/30/2005 Document Revised: 12/05/2013 Document Reviewed: 03/29/2007 [facility] Patient Information 2015 [place], [place]. This information is not intended to replace advice given to you by your health care provider. Make sure you discuss any questions you have with your health care provider. Finger Fracture A finger fracture is when one or more bones in the finger break.  HOME CARE   Wear the splint, tape, or cast as long as told by your doctor.  Keep your fingers in the position your doctor tell you to.  Raise (elevate) the injured area above the level of the heart.  Only take medicine as told by your doctor.  Put ice on the injured area.  Put ice in a plastic bag.  Place a towel between the skin and the bag.  Leave the ice on for 15-20 minutes, 03-04 times a day.  Follow up with your doctor.  Ask what exercises you can do when the splint comes off. GET HELP RIGHT AWAY IF:   The fingernails are white or bluish.  You have pain not helped by medicine.  You cannot move your fingertips.  You lose feeling (numbness) in the injured finger(s). MAKE SURE YOU:   Understand these instructions.  Will watch this condition.  Will get help right away if you are not doing well or get worse. Document Released: 01/07/2008 Document Revised: 10/13/2011 Document Reviewed: 01/07/2008 [facility] Patient Information 2015 [place], [place]. This information is  not intended to replace advice given to you by your health care provider. Make sure you discuss any questions you have with your health care provider.

## 2015-03-22 NOTE — ED Notes (Signed)
Provider at bedside

## 2015-03-22 NOTE — ED Provider Notes (Signed)
CSN: [id number]     Arrival date & time 03/21/15  2210 History  This chart was scribed for Scott Kaplan, MD by Octavia Heir, ED Scribe. This patient was seen in room D32C/D32C and the patient's care was started at 1:14 AM.    Chief Complaint  Patient presents with  . Abdominal Pain      The history is provided by the patient. No language interpreter was used.   HPI Comments: Scott Keith is a 39 y.o. male who has a hx of Crohn's disease presents to the Emergency Department complaining of intermittent, recurring, gradual worsening RUQ abdominal pain onset this evening. He states has vomited twice, diarrhea once and associated nausea. Pt notes he has had this pain before and is caused by his Crohns. Pt is currently on 10 mg of prednisone. Pt reports taking oxycodone to alleviate the pain with minimal relief. He has a past surgical hx of cholecystectomy. He has an appointment to see his GI doctor, Dr. [name] in the upcoming week. Pt denies mucus from stool and fevers.  Pt also notes of right ring finger pain and swelling onset yesterday. Pt states he jammed his finger to a wall which caused his injury 2 days ago.   Past Medical History  Diagnosis Date  . Crohn disease    Past Surgical History  Procedure Laterality Date  . Cholecystectomy     No family history on file. Social History  Substance Use Topics  . Smoking status: Current Every Day Smoker -- 0.50 packs/day    Types: Cigarettes  . Smokeless tobacco: None  . Alcohol Use: Yes     Comment: occ    Review of Systems  A complete 10 system review of systems was obtained and all systems are negative except as noted in the HPI and PMH.    Allergies  Toradol and Morphine and related  Home Medications   Prior to Admission medications   Medication Sig Start Date End Date Taking? Authorizing Provider  mesalamine (PENTASA) 250 MG CR capsule Take 250 mg by mouth 4 (four) times daily.    Yes Historical Provider, MD  predniSONE  (DELTASONE) 10 MG tablet Take 20 mg by mouth daily. 03/14/15  Yes Historical Provider, MD  HYDROcodone-acetaminophen (NORCO) 7.5-325 MG per tablet Take 1 tablet by mouth every 4 (four) hours as needed. Patient not taking: Reported on 03/22/2015 03/05/15   [name], PA-C  ondansetron (ZOFRAN) 4 MG tablet Take 1 tablet (4 mg total) by mouth every 6 (six) hours. Patient not taking: Reported on 03/22/2015 02/26/15   [name], MD  oxyCODONE-acetaminophen (PERCOCET/ROXICET) 5-325 MG per tablet Take 1 tablet by mouth every 4 (four) hours as needed for severe pain. Patient not taking: Reported on 03/22/2015 03/05/15   [name], PA-C  promethazine (PHENERGAN) 25 MG tablet Take 1 tablet (25 mg total) by mouth every 6 (six) hours as needed. Patient not taking: Reported on 03/22/2015 02/13/15   [name], MD   Triage vitals: BP 119/68 mmHg  Pulse 106  Temp(Src) 98.4 F (36.9 C) (Oral)  Resp 20  SpO2 95% Physical Exam  Constitutional: He is oriented to person, place, and time. He appears well-developed and well-nourished.  HENT:  Head: Normocephalic and atraumatic.  Eyes: EOM are normal.  Neck: Normal range of motion.  Cardiovascular: Normal rate, regular rhythm, normal heart sounds and intact distal pulses.   Pulmonary/Chest: Effort normal and breath sounds normal. No respiratory distress.  Bilateral breath sounds  Abdominal: Soft. He  exhibits no distension. There is tenderness. There is no rebound and no guarding.  Tenderness towards left quadrant with no rebound and no guarding  Musculoskeletal: Normal range of motion.  Neurological: He is alert and oriented to person, place, and time.  Skin: Skin is warm and dry.  Psychiatric: He has a normal mood and affect. Judgment normal.  Nursing note and vitals reviewed.   ED Course  NERVE BLOCK Date/Time: 03/22/2015 6:30 AM Performed by: Scott Keith Authorized by: Scott Keith Consent: Verbal consent obtained. Risks and benefits:  risks, benefits and alternatives were discussed Consent given by: patient Patient understanding: patient states understanding of the procedure being performed Imaging studies: imaging studies available Patient identity confirmed: arm band Time out: Immediately prior to procedure a "time out" was called to verify the correct patient, procedure, equipment, support staff and site/side marked as required. Indications: pain relief Body area: upper extremity Nerve: digital Laterality: right Patient sedated: no Preparation: Patient was prepped and draped in the usual sterile fashion. Patient position: sitting Needle gauge: 25 G Location technique: anatomical landmarks Local anesthetic: lidocaine 1% without epinephrine Anesthetic total: 1 ml Outcome: pain improved Patient tolerance: Patient tolerated the procedure well with no immediate complications  Reduction of dislocation Date/Time: 03/22/2015 6:55 AM Performed by: Scott Keith Authorized by: Scott Keith Consent: Verbal consent obtained. Risks and benefits: risks, benefits and alternatives were discussed Consent given by: patient Patient understanding: patient states understanding of the procedure being performed Imaging studies: imaging studies available Required items: required blood products, implants, devices, and special equipment available Patient identity confirmed: arm band Time out: Immediately prior to procedure a "time out" was called to verify the correct patient, procedure, equipment, support staff and site/side marked as required. Comments: The attempt to reduce the PIP fracture dislocation failed.    DIAGNOSTIC STUDIES: Oxygen Saturation is 95% on RA, adequate by my interpretation.  COORDINATION OF CARE:  1:18 AM Discussed treatment plan with which include fluids, pain medication, and nausea medication pt at bedside and pt agreed to plan.  Labs Review Labs Reviewed  COMPREHENSIVE METABOLIC PANEL - Abnormal;  Notable for the following:    Creatinine, Ser 1.38 (*)    Calcium 8.4 (*)    Total Protein 5.7 (*)    Albumin 3.0 (*)    All other components within normal limits  CBC - Abnormal; Notable for the following:    Hemoglobin 12.9 (*)    All other components within normal limits  URINALYSIS, ROUTINE W REFLEX MICROSCOPIC (NOT AT [facility]) - Abnormal; Notable for the following:    Ketones, ur 15 (*)    Leukocytes, UA TRACE (*)    All other components within normal limits  URINE MICROSCOPIC-ADD ON - Abnormal; Notable for the following:    Squamous Epithelial / LPF FEW (*)    Bacteria, UA FEW (*)    Crystals CA OXALATE CRYSTALS (*)    All other components within normal limits  LIPASE, BLOOD    Imaging Review Dg Finger Ring Left  03/22/2015   CLINICAL DATA:  Proximal interphalangeal joint dislocation, status post reduction.  EXAM: LEFT RING FINGER 2+V  COMPARISON:  03/22/2015 at 1:48 a.m.  FINDINGS: Volar base plate fracture of the middle phalanx fourth finger. There appears to be a transverse fracture of the middle phalanx extending from the proximal metaphysis dorsally to the defect from the volar base plate. This transverse fracture was less apparent on the earlier exam and is only suggested on the lateral projection.  The  proximal interphalangeal joint remains dislocated, with the base of the middle phalanx dorsal to the head of the proximal phalanx.  IMPRESSION: 1. Proximal interphalangeal joint remains dislocated. 2. Volar base plate avulsion fracture of the middle phalanx of the fourth finger. 3. Lateral view suggests a possible transverse fracture of the middle phalangeal base extending from the dorsal metaphysis to the volar articular margin.   Electronically Signed   By: Gaylyn Rong M.D.   On: 03/22/2015 08:16   Dg Finger Ring Right  03/22/2015   CLINICAL DATA:  Right ring finger pain and swelling, after punching an object. Initial encounter.  EXAM: RIGHT RING FINGER 2+V  COMPARISON:   Right hand radiographs performed 03/05/2015  FINDINGS: There is dorsal dislocation of the fourth middle phalanx at the fourth proximal interphalangeal joint, with a small associated fracture off the palmar aspect of the base of the fourth middle phalanx, and surrounding soft tissue swelling. The fourth middle phalanx is lodged against the dorsal aspect of the distal fourth proximal phalanx.  No additional fractures are seen. Remaining visualized joint spaces are grossly preserved.  IMPRESSION: Dorsal dislocation of the fourth middle phalanx at the fourth proximal interphalangeal joint, with small associated fracture off the palmar aspect of the base of the fourth middle phalanx, and surrounding soft tissue swelling.   Electronically Signed   By: Roanna Raider M.D.   On: 03/22/2015 02:22   I have personally reviewed and evaluated these images and lab results as part of my medical decision-making.   EKG Interpretation None      :00 am: I spoke with Dr. Amanda Pea, Hand Surgery, after failure from my side to reduce the fracture dislocation. Dr. Amanda Pea astutely picked up the xray of the same digit from 8/1, when he was seen at [facility]. He had a fracture dislocation noted that time as well. Pt had informed me that his injury was only 90 days old. Pt now states that he initially injured his finger prior to 8/1, and was discharged with a finger splint, which he removed 2 days later as it was not comfortable. Pt has no ortho f/u at this time.  Dr. Amanda Pea believes that the finger will likely need fusion surgery. He advises ortho f/u in [place] or f/u with him in the clinic. Since pt has no ortho f.u, he is a young male who is right handed dominant, we will have him see Dr. Amanda Pea for optimal care.  MDM   Final diagnoses:  Finger fracture, right, with nonunion, subsequent encounter  Crohn's disease, without complications    I personally performed the services described in this documentation, which was  scribed in my presence. The recorded information has been reviewed and is accurate.  PT comes in with abd pain He has crohns disease, he has 0/4 SIRS criteria, and a non peritoneal abdomen. We will monitor him here and give pain relief.  Pt also c/o finger injury. Xrays reveal PIP fracture with dislocation. The injury is 19 days old, but we will try to reduce the digit.    Scott Kaplan, MD 03/23/15 1259

## 2015-03-25 ENCOUNTER — Emergency Department (HOSPITAL_COMMUNITY)
Admission: EM | Admit: 2015-03-25 | Discharge: 2015-03-25 | Disposition: A | Discharge status: Home / Self Care | Payer: Medicaid Other | Attending: Emergency Medicine | Admitting: Emergency Medicine

## 2015-03-25 ENCOUNTER — Encounter (HOSPITAL_COMMUNITY): Payer: Self-pay | Admitting: Emergency Medicine

## 2015-03-25 DIAGNOSIS — R630 Anorexia: Secondary | ICD-10-CM | POA: Insufficient documentation

## 2015-03-25 DIAGNOSIS — R197 Diarrhea, unspecified: Secondary | ICD-10-CM | POA: Insufficient documentation

## 2015-03-25 DIAGNOSIS — R1084 Generalized abdominal pain: Secondary | ICD-10-CM | POA: Diagnosis present

## 2015-03-25 DIAGNOSIS — Z720 Tobacco use: Secondary | ICD-10-CM | POA: Diagnosis not present

## 2015-03-25 DIAGNOSIS — G8929 Other chronic pain: Secondary | ICD-10-CM | POA: Diagnosis not present

## 2015-03-25 DIAGNOSIS — R112 Nausea with vomiting, unspecified: Secondary | ICD-10-CM | POA: Insufficient documentation

## 2015-03-25 DIAGNOSIS — Z8719 Personal history of other diseases of the digestive system: Secondary | ICD-10-CM | POA: Diagnosis not present

## 2015-03-25 DIAGNOSIS — R109 Unspecified abdominal pain: Secondary | ICD-10-CM

## 2015-03-25 DIAGNOSIS — Z79899 Other long term (current) drug therapy: Secondary | ICD-10-CM | POA: Insufficient documentation

## 2015-03-25 LAB — CBC WITH DIFFERENTIAL/PLATELET
BASOS PCT: 0 % (ref 0–1)
Basophils Absolute: 0 10*3/uL (ref 0.0–0.1)
EOS ABS: 0.1 10*3/uL (ref 0.0–0.7)
Eosinophils Relative: 1 % (ref 0–5)
HCT: 41.5 % (ref 39.0–52.0)
HEMOGLOBIN: 13.4 g/dL (ref 13.0–17.0)
Lymphocytes Relative: 28 % (ref 12–46)
Lymphs Abs: 2.3 10*3/uL (ref 0.7–4.0)
MCH: 28.2 pg (ref 26.0–34.0)
MCHC: 32.3 g/dL (ref 30.0–36.0)
MCV: 87.4 fL (ref 78.0–100.0)
MONO ABS: 0.5 10*3/uL (ref 0.1–1.0)
Monocytes Relative: 6 % (ref 3–12)
NEUTROS PCT: 65 % (ref 43–77)
Neutro Abs: 5.2 10*3/uL (ref 1.7–7.7)
Platelets: 222 10*3/uL (ref 150–400)
RBC: 4.75 MIL/uL (ref 4.22–5.81)
RDW: 15.2 % (ref 11.5–15.5)
WBC: 8 10*3/uL (ref 4.0–10.5)

## 2015-03-25 LAB — URINALYSIS, ROUTINE W REFLEX MICROSCOPIC
BILIRUBIN URINE: NEGATIVE
Glucose, UA: NEGATIVE mg/dL
Hgb urine dipstick: NEGATIVE
Leukocytes, UA: NEGATIVE
NITRITE: NEGATIVE
Protein, ur: NEGATIVE mg/dL
Specific Gravity, Urine: 1.025 (ref 1.005–1.030)
Urobilinogen, UA: 0.2 mg/dL (ref 0.0–1.0)
pH: 6.5 (ref 5.0–8.0)

## 2015-03-25 LAB — COMPREHENSIVE METABOLIC PANEL
ALT: 15 U/L — AB (ref 17–63)
ANION GAP: 9 (ref 5–15)
AST: 17 U/L (ref 15–41)
Albumin: 3.1 g/dL — ABNORMAL LOW (ref 3.5–5.0)
Alkaline Phosphatase: 70 U/L (ref 38–126)
BUN: 12 mg/dL (ref 6–20)
CHLORIDE: 108 mmol/L (ref 101–111)
CO2: 25 mmol/L (ref 22–32)
Calcium: 8 mg/dL — ABNORMAL LOW (ref 8.9–10.3)
Creatinine, Ser: 1.19 mg/dL (ref 0.61–1.24)
Glucose, Bld: 112 mg/dL — ABNORMAL HIGH (ref 65–99)
POTASSIUM: 3.2 mmol/L — AB (ref 3.5–5.1)
SODIUM: 142 mmol/L (ref 135–145)
Total Bilirubin: 0.6 mg/dL (ref 0.3–1.2)
Total Protein: 5.6 g/dL — ABNORMAL LOW (ref 6.5–8.1)

## 2015-03-25 LAB — I-STAT CG4 LACTIC ACID, ED: LACTIC ACID, VENOUS: 2.96 mmol/L — AB (ref 0.5–2.0)

## 2015-03-25 LAB — LIPASE, BLOOD: LIPASE: 38 U/L (ref 22–51)

## 2015-03-25 LAB — SEDIMENTATION RATE: SED RATE: 4 mm/h (ref 0–16)

## 2015-03-25 MED ORDER — SODIUM CHLORIDE 0.9 % IV BOLUS (SEPSIS)
1000.0000 mL | Freq: Once | INTRAVENOUS | Status: AC
  Administered 2015-03-25: 1000 mL via INTRAVENOUS

## 2015-03-25 MED ORDER — ONDANSETRON HCL 4 MG/2ML IJ SOLN
4.0000 mg | Freq: Once | INTRAMUSCULAR | Status: AC
  Administered 2015-03-25: 4 mg via INTRAVENOUS
  Filled 2015-03-25: qty 2

## 2015-03-25 MED ORDER — FENTANYL CITRATE (PF) 100 MCG/2ML IJ SOLN
50.0000 ug | Freq: Once | INTRAMUSCULAR | Status: AC
  Administered 2015-03-25: 50 ug via INTRAVENOUS
  Filled 2015-03-25: qty 2

## 2015-03-25 NOTE — ED Provider Notes (Signed)
CSN: [id number]     Arrival date & time 03/25/15  1247 History   First MD Initiated Contact with Patient 03/25/15 1257     Chief Complaint  Patient presents with  . Abdominal Pain     (Consider location/radiation/quality/duration/timing/severity/associated sxs/prior Treatment) Patient is a 39 y.o. male presenting with abdominal pain. The history is provided by the patient.  Abdominal Pain Pain location:  Generalized Pain quality: aching and sharp   Pain radiates to:  Does not radiate Pain severity:  Moderate Onset quality:  Sudden Duration:  1 hour Timing:  Constant Progression:  Unchanged Chronicity:  Recurrent Context: retching   Relieved by:  Nothing Worsened by:  Nothing tried Ineffective treatments:  None tried Associated symptoms: anorexia, diarrhea, nausea and vomiting     Past Medical History  Diagnosis Date  . Crohn disease    Past Surgical History  Procedure Laterality Date  . Cholecystectomy     History reviewed. No pertinent family history. Social History  Substance Use Topics  . Smoking status: Current Every Day Smoker -- 0.50 packs/day    Types: Cigarettes  . Smokeless tobacco: None  . Alcohol Use: Yes     Comment: occ    Review of Systems  Gastrointestinal: Positive for nausea, vomiting, abdominal pain, diarrhea and anorexia.  All other systems reviewed and are negative.     Allergies  Toradol and Morphine and related  Home Medications   Prior to Admission medications   Medication Sig Start Date End Date Taking? Authorizing Provider  mesalamine (PENTASA) 250 MG CR capsule Take 250 mg by mouth 4 (four) times daily.    Yes Historical Provider, MD  predniSONE (DELTASONE) 10 MG tablet Take 20 mg by mouth daily. 03/14/15  Yes Historical Provider, MD  HYDROcodone-acetaminophen (NORCO) 7.5-325 MG per tablet Take 1 tablet by mouth every 4 (four) hours as needed. Patient not taking: Reported on 03/22/2015 03/05/15   [name], PA-C  ondansetron  (ZOFRAN) 4 MG tablet Take 1 tablet (4 mg total) by mouth every 6 (six) hours. Patient not taking: Reported on 03/22/2015 02/26/15   [name], MD  oxyCODONE-acetaminophen (PERCOCET/ROXICET) 5-325 MG per tablet Take 1 tablet by mouth every 4 (four) hours as needed for severe pain. Patient not taking: Reported on 03/22/2015 03/05/15   [name], PA-C  promethazine (PHENERGAN) 25 MG tablet Take 1 tablet (25 mg total) by mouth every 6 (six) hours as needed. Patient not taking: Reported on 03/22/2015 02/13/15   [name], MD   BP 127/72 mmHg  Pulse 95  Temp(Src) 97.8 F (36.6 C) (Oral)  Resp 18  Ht  (1.727 m)  Wt 145 lb (65.772 kg)  BMI 22.05 kg/m2  SpO2 100% Physical Exam  Constitutional: He is oriented to person, place, and time. He appears well-developed and well-nourished. No distress.  HENT:  Head: Normocephalic and atraumatic.  Eyes: Conjunctivae are normal.  Neck: Neck supple. No tracheal deviation present.  Cardiovascular: Normal rate, regular rhythm and normal heart sounds.   Pulmonary/Chest: Effort normal and breath sounds normal. No respiratory distress.  Abdominal: Soft. Bowel sounds are normal. He exhibits no distension. There is tenderness (mild diffuse). There is no rebound and no guarding.  Neurological: He is alert and oriented to person, place, and time.  Skin: Skin is warm and dry.  Psychiatric: He has a normal mood and affect.    ED Course  Procedures (including critical care time) Labs Review Labs Reviewed  URINALYSIS, ROUTINE W REFLEX MICROSCOPIC (NOT AT  [facility]) - Abnormal; Notable for the following:    APPearance HAZY (*)    Ketones, ur TRACE (*)    All other components within normal limits  COMPREHENSIVE METABOLIC PANEL - Abnormal; Notable for the following:    Potassium 3.2 (*)    Glucose, Bld 112 (*)    Calcium 8.0 (*)    Total Protein 5.6 (*)    Albumin 3.1 (*)    ALT 15 (*)    All other components within normal limits  I-STAT CG4 LACTIC  ACID, ED - Abnormal; Notable for the following:    Lactic Acid, Venous 2.96 (*)    All other components within normal limits  CBC WITH DIFFERENTIAL/PLATELET  LIPASE, BLOOD  SEDIMENTATION RATE    Imaging Review No results found. I have personally reviewed and evaluated these images and lab results as part of my medical decision-making.   EKG Interpretation None      MDM   Final diagnoses:  Chronic abdominal pain    39 year old male with history of Crohn's disease presents with ongoing abdominal pain. He has been seen previously for this as well as injuries relating from aggressive behavior. He is withdrawn on arrival, was provided a seal this of IV narcotic medication for pain, labs were drawn and there is no evidence of inflammatory marker elevation or acute infection to suggest indication for imaging currently. Patient appears mildly dehydrated with elevation of lactate acid, plan and IV fluid rehydration therapy with a recheck of lactate. Patient asking for Dilaudid, stating fentanyl did not work for his pain and I explained that this pain is likely chronic in nature and more not require acute pain doses of medications, nor do we typically treat chronic pain in the ED. He is R he had multiple visits to this emergency department, 8 in the last 6 months, as well as visits to [facility], [place], and other surrounding hospitals. Patient began yelling at myself and nursing staff cursing, pulled out his own IV, eloped from the emergency department in stable condition under his own power.    Lyndal Pulley, MD 03/25/15 1910

## 2015-03-25 NOTE — ED Notes (Signed)
In to redraw blood for 2nd lactic acid. Patient refusing until "I speak to the doctor". Patient agitated and rude to staff. MD notified.

## 2015-03-25 NOTE — ED Notes (Signed)
MD notified of patient request for pain medication

## 2015-03-25 NOTE — ED Notes (Signed)
Pt states that he has hx of Crohns disease - About 1 hour ago started with N/V/D and abdo pain - feels it is a flair up of his Crohns

## 2015-03-25 NOTE — Discharge Instructions (Signed)

## 2015-03-25 NOTE — ED Notes (Signed)
MD in to speak with patient. Patient became hostile with staff, yelling and cursing at doctor. Patient stating he is leaving, started pulling monitor cords off and getting dressed. Patient pulled IV out of his arm and left ED without waiting for discharge papers and refused to sign.

## 2015-11-22 IMAGING — CR DG FINGER RING 2+V*R*
3 series · 3 of 3 positions shown · non-contrast
Comparison: Right hand radiographs performed 03/05/2015

CLINICAL DATA: Right ring finger pain and swelling, after punching
an object. Initial encounter.

EXAM:
RIGHT RING FINGER 2+V

[finger ap]
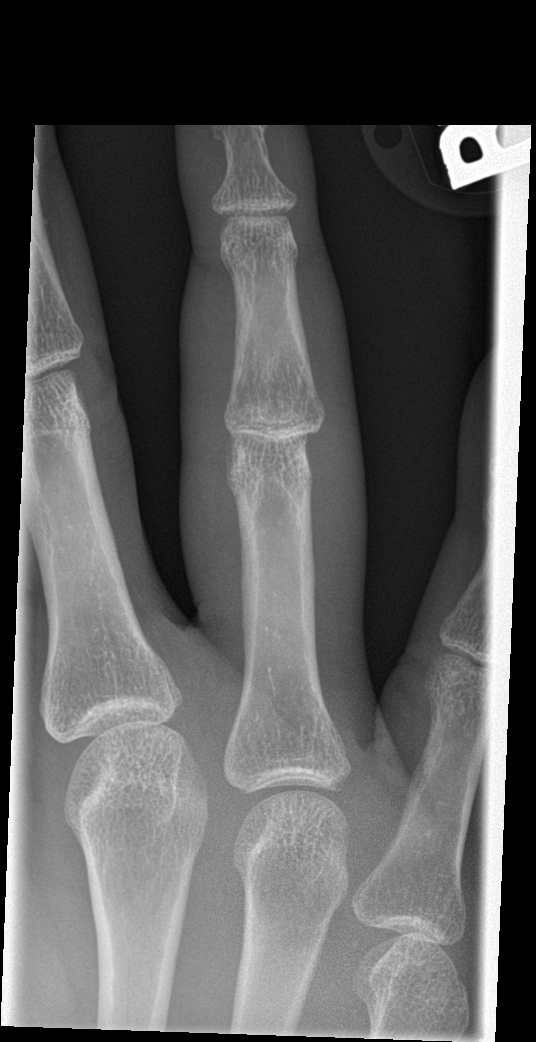

[finger obl]
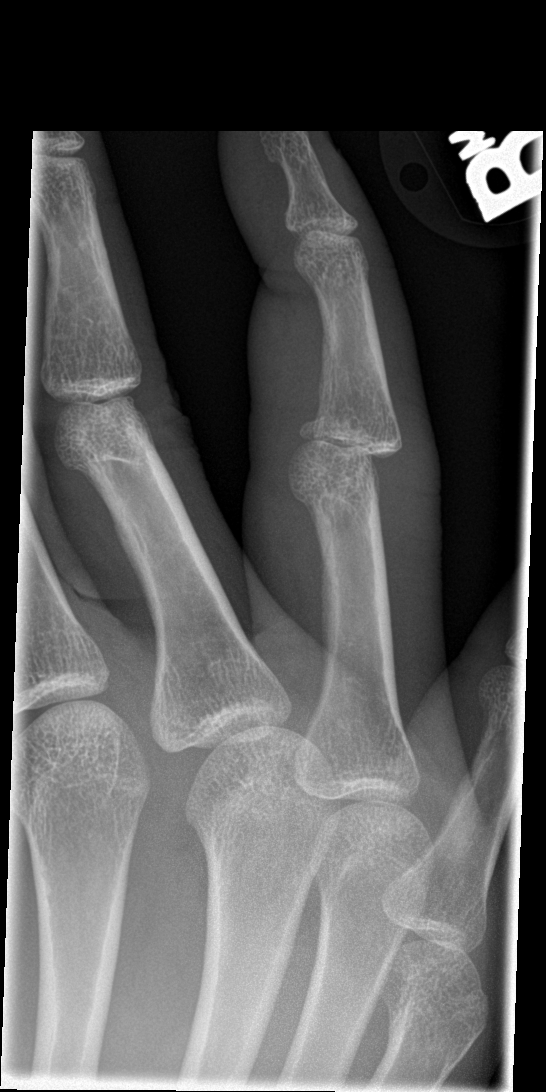

[finger lat]
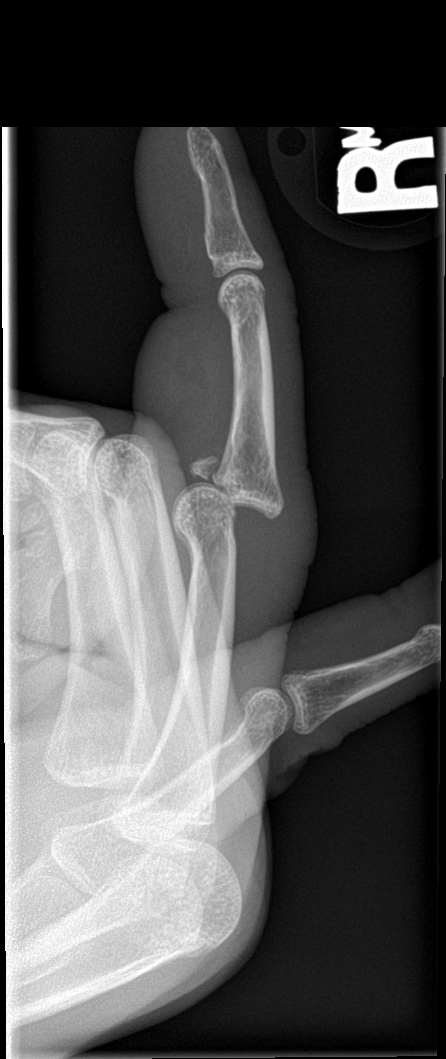

[3 of 3 positions shown; findings below may reference images not displayed]

FINDINGS: There is dorsal dislocation of the fourth middle phalanx at the
fourth proximal interphalangeal joint, with a small associated
fracture off the palmar aspect of the base of the fourth middle
phalanx, and surrounding soft tissue swelling. The fourth middle
phalanx is lodged against the dorsal aspect of the distal fourth
proximal phalanx.

No additional fractures are seen. Remaining visualized joint spaces
are grossly preserved.
IMPRESSION: Dorsal dislocation of the fourth middle phalanx at the fourth
proximal interphalangeal joint, with small associated fracture off
the palmar aspect of the base of the fourth middle phalanx, and
surrounding soft tissue swelling.

## 2015-11-22 IMAGING — CR DG FINGER RING 2+V*L*
3 series · 3 of 3 positions shown · non-contrast
Comparison: 03/22/2015 at [DATE] a.m.

CLINICAL DATA: Proximal interphalangeal joint dislocation, status
post reduction.

EXAM:
LEFT RING FINGER 2+V

[PA (1 of 2)]
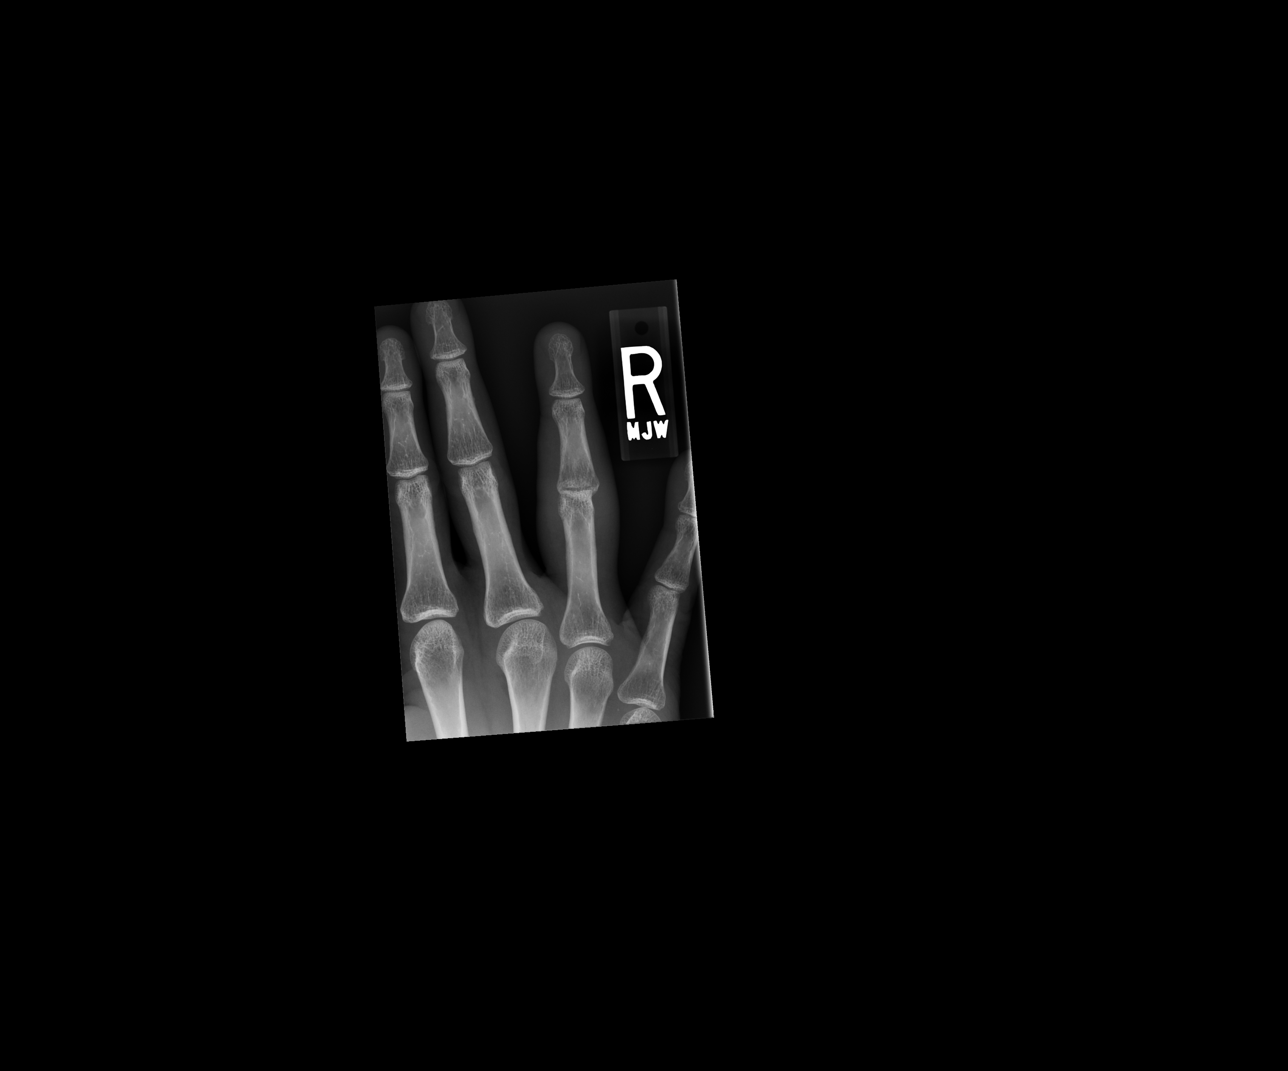

[PA (2 of 2)]
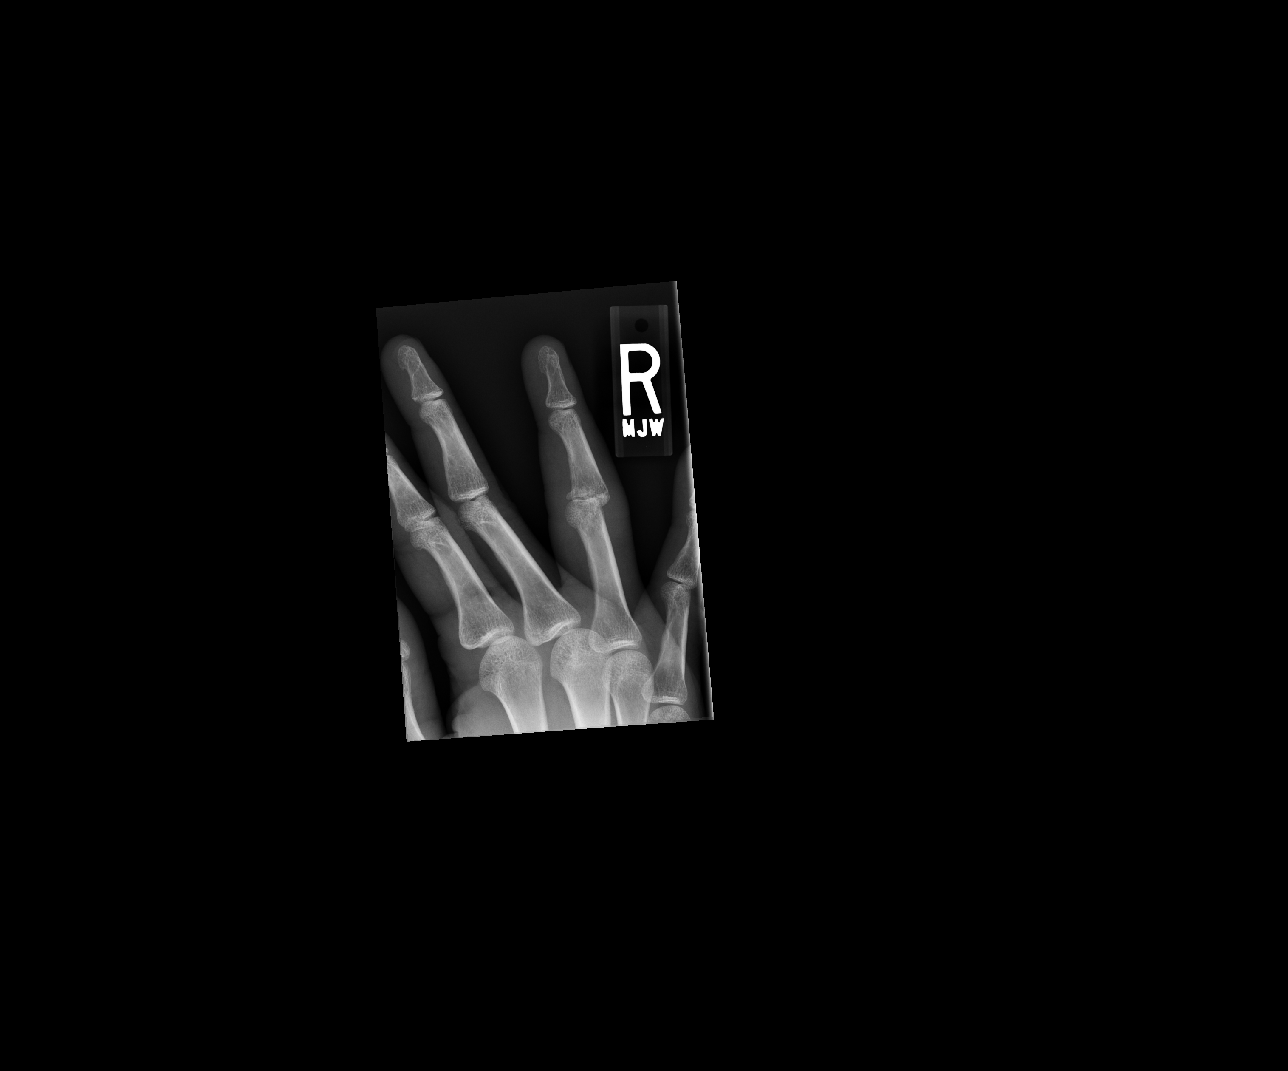

[lateral]
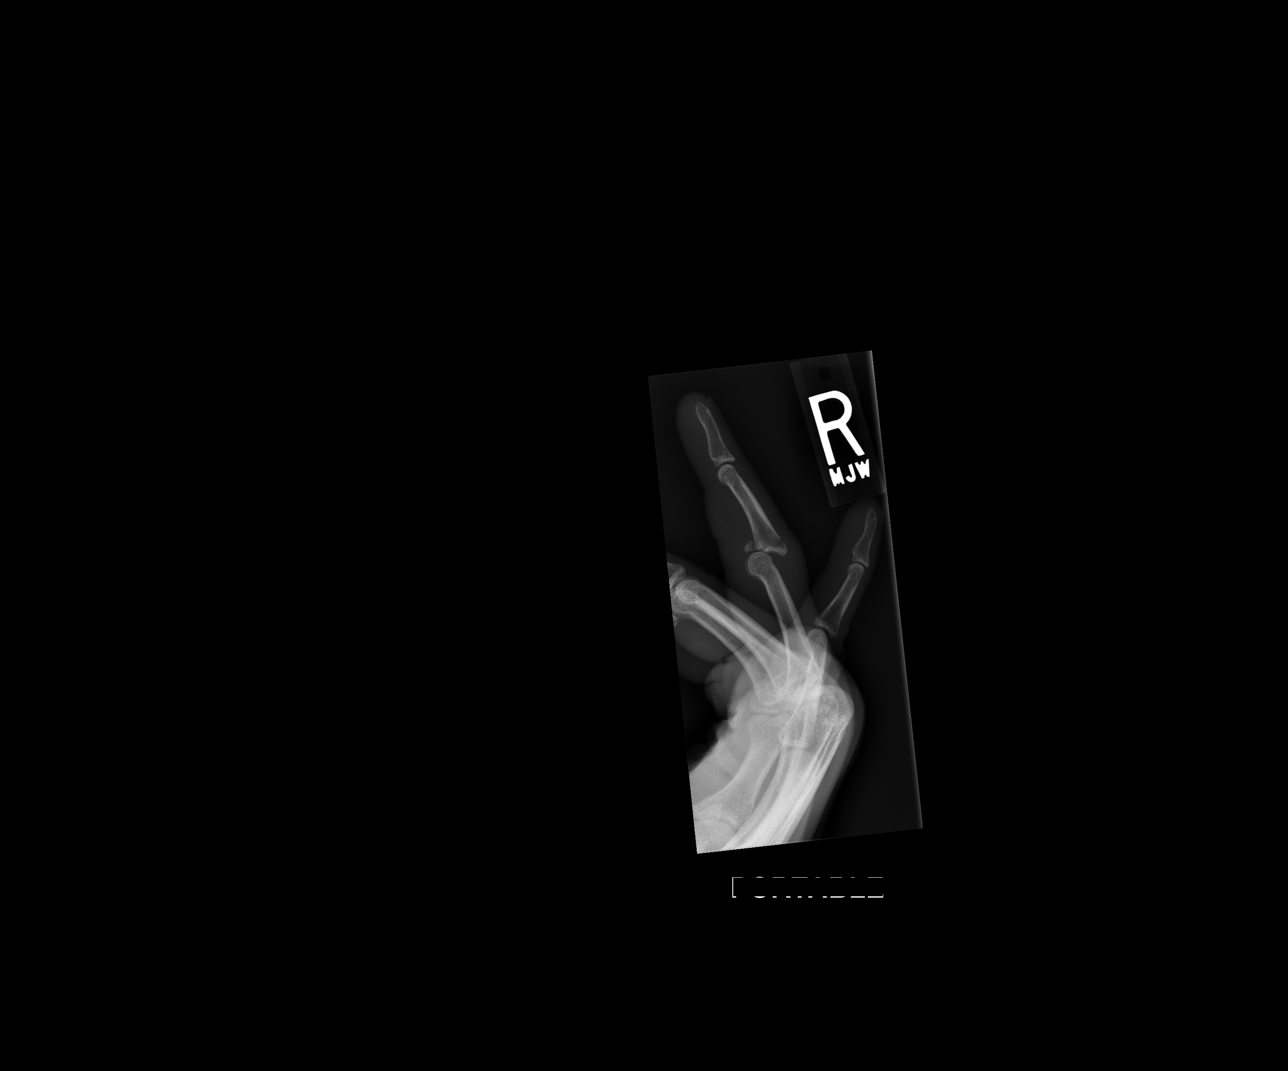

[3 of 3 positions shown; findings below may reference images not displayed]

FINDINGS: Volar base plate fracture of the middle phalanx fourth finger. There
appears to be a transverse fracture of the middle phalanx extending
from the proximal metaphysis dorsally to the defect from the volar
base plate. This transverse fracture was less apparent on the
earlier exam and is only suggested on the lateral projection.

The proximal interphalangeal joint remains dislocated, with the base
of the middle phalanx dorsal to the head of the proximal phalanx.
IMPRESSION: 1. Proximal interphalangeal joint remains dislocated.
2. Volar base plate avulsion fracture of the middle phalanx of the
fourth finger.
3. Lateral view suggests a possible transverse fracture of the
middle phalangeal base extending from the dorsal metaphysis to the
volar articular margin.

## 2019-08-28 ENCOUNTER — Ambulatory Visit: Admit: 2019-08-28 | Discharge: 2019-09-13 | Disposition: A | Discharge status: Home Health | Payer: MEDICAID

## 2019-08-28 ENCOUNTER — Encounter: Admit: 2019-08-28 | Discharge: 2019-09-13 | Disposition: A | Discharge status: Home Health | Payer: MEDICAID

## 2019-09-13 MED ORDER — LOPERAMIDE 2 MG CAPSULE
ORAL_CAPSULE | Freq: Four times a day (QID) | ORAL | 0 refills | 30 days | Status: CP
Start: 2019-09-13 — End: 2019-10-13
  Filled 2019-09-13: qty 120, 30d supply, fill #0

## 2019-09-13 MED ORDER — OXYCODONE 5 MG TABLET
ORAL_TABLET | ORAL | 0 refills | 7 days | Status: CP | PRN
Start: 2019-09-13 — End: 2019-09-20
  Filled 2019-09-13: qty 40, 7d supply, fill #0

## 2019-09-13 MED ORDER — CEPHALEXIN 500 MG CAPSULE
ORAL_CAPSULE | Freq: Four times a day (QID) | ORAL | 0 refills | 2.00000 days | Status: CP
Start: 2019-09-13 — End: 2019-09-15
  Filled 2019-09-13: qty 8, 2d supply, fill #0

## 2019-09-13 MED ORDER — GABAPENTIN 300 MG CAPSULE
ORAL_CAPSULE | Freq: Three times a day (TID) | ORAL | 0 refills | 30 days | Status: CP
Start: 2019-09-13 — End: 2019-10-13
  Filled 2019-09-13: qty 90, 30d supply, fill #0

## 2019-09-13 MED ORDER — ENOXAPARIN 30 MG/0.3 ML SUBCUTANEOUS SYRINGE
Freq: Two times a day (BID) | SUBCUTANEOUS | 0 refills | 30 days | Status: CP
Start: 2019-09-13 — End: 2019-10-13
  Filled 2019-09-13: qty 9, 15d supply, fill #0

## 2019-09-13 MED ORDER — DIPHENOXYLATE-ATROPINE 2.5 MG-0.025 MG TABLET
ORAL_TABLET | Freq: Two times a day (BID) | ORAL | 0 refills | 30 days | Status: CP
Start: 2019-09-13 — End: 2019-10-13
  Filled 2019-09-13: qty 120, 30d supply, fill #0

## 2019-09-13 MED FILL — DIPHENOXYLATE-ATROPINE 2.5 MG-0.025 MG TABLET: 30 days supply | Qty: 120 | Fill #0 | Status: AC

## 2019-09-13 MED FILL — LOPERAMIDE 2 MG CAPSULE: 30 days supply | Qty: 120 | Fill #0 | Status: AC

## 2019-09-13 MED FILL — GABAPENTIN 300 MG CAPSULE: 30 days supply | Qty: 90 | Fill #0 | Status: AC

## 2019-09-13 MED FILL — CEPHALEXIN 500 MG CAPSULE: 2 days supply | Qty: 8 | Fill #0 | Status: AC

## 2019-09-13 MED FILL — ENOXAPARIN 30 MG/0.3 ML SUBCUTANEOUS SYRINGE: 15 days supply | Qty: 9 | Fill #0 | Status: AC

## 2019-09-13 MED FILL — OXYCODONE 5 MG TABLET: 7 days supply | Qty: 40 | Fill #0 | Status: AC

## 2019-09-13 MED FILL — FERROUS SULFATE 325 MG (65 MG IRON) TABLET: 30 days supply | Qty: 15 | Fill #0 | Status: AC

## 2019-09-14 MED ORDER — FERROUS SULFATE 325 MG (65 MG IRON) TABLET
ORAL_TABLET | ORAL | 11 refills | 30 days | Status: CP
Start: 2019-09-14 — End: 2020-09-13
  Filled 2019-09-13: qty 15, 30d supply, fill #0

## 2019-09-15 MED ORDER — LOPERAMIDE 2 MG CAPSULE
ORAL_CAPSULE | Freq: Four times a day (QID) | ORAL | 0 refills | 30 days | Status: CP
Start: 2019-09-15 — End: 2019-10-15

## 2019-09-15 MED ORDER — DIPHENOXYLATE-ATROPINE 2.5 MG-0.025 MG TABLET
ORAL_TABLET | Freq: Two times a day (BID) | ORAL | 0 refills | 30 days | Status: CP
Start: 2019-09-15 — End: 2019-10-15

## 2019-09-19 ENCOUNTER — Encounter
Admit: 2019-09-19 | Discharge: 2019-11-11 | Disposition: A | Discharge status: Home Health | Payer: MEDICAID | Source: Ambulatory Visit | Attending: Certified Registered"

## 2019-09-19 ENCOUNTER — Encounter
Admit: 2019-09-19 | Discharge: 2019-11-11 | Disposition: A | Discharge status: Home Health | Payer: MEDICAID | Source: Ambulatory Visit | Attending: Registered Nurse

## 2019-09-19 ENCOUNTER — Ambulatory Visit
Admit: 2019-09-19 | Discharge: 2019-11-11 | Disposition: A | Discharge status: Home Health | Payer: MEDICAID | Source: Ambulatory Visit

## 2019-09-19 ENCOUNTER — Encounter
Admit: 2019-09-19 | Discharge: 2019-11-11 | Disposition: A | Discharge status: Home Health | Payer: MEDICAID | Source: Ambulatory Visit

## 2019-09-19 DIAGNOSIS — Z09 Encounter for follow-up examination after completed treatment for conditions other than malignant neoplasm: Principal | ICD-10-CM

## 2019-11-11 MED ORDER — OXYCODONE 5 MG TABLET
ORAL_TABLET | ORAL | 0 refills | 5.00000 days | Status: CP | PRN
Start: 2019-11-11 — End: 2019-11-16
  Filled 2019-11-11: qty 30, 5d supply, fill #0

## 2019-11-11 MED ORDER — ENOXAPARIN 30 MG/0.3 ML SUBCUTANEOUS SYRINGE
Freq: Two times a day (BID) | SUBCUTANEOUS | 0 refills | 21.00000 days | Status: CP
Start: 2019-11-11 — End: 2019-12-02
  Filled 2019-11-11: qty 12.6, 21d supply, fill #0

## 2019-11-11 MED ORDER — MELATONIN 3 MG TABLET
ORAL_TABLET | Freq: Every evening | ORAL | 0 refills | 60.00000 days | Status: CP | PRN
Start: 2019-11-11 — End: ?
  Filled 2019-11-11: qty 60, 60d supply, fill #0

## 2019-11-11 MED ORDER — ONDANSETRON 4 MG DISINTEGRATING TABLET
ORAL_TABLET | Freq: Three times a day (TID) | ORAL | 0 refills | 4.00000 days | Status: CP | PRN
Start: 2019-11-11 — End: 2019-12-11
  Filled 2019-11-11: qty 10, 4d supply, fill #0

## 2019-11-11 MED ORDER — PEDIATRIC MULTIVITAMIN-IRON CHEWABLE TABLET
ORAL_TABLET | Freq: Two times a day (BID) | ORAL | 0 refills | 25.00000 days | Status: CP
Start: 2019-11-11 — End: ?

## 2019-11-11 MED FILL — MELATONIN 3 MG TABLET: 60 days supply | Qty: 60 | Fill #0 | Status: AC

## 2019-11-11 MED FILL — BUDESONIDE DR - ER 3 MG CAPSULE,DELAYED,EXTENDED RELEASE: 30 days supply | Qty: 90 | Fill #0 | Status: AC

## 2019-11-11 MED FILL — ENOXAPARIN 30 MG/0.3 ML SUBCUTANEOUS SYRINGE: 21 days supply | Qty: 13 | Fill #0 | Status: AC

## 2019-11-11 MED FILL — OXYCODONE 5 MG TABLET: 5 days supply | Qty: 30 | Fill #0 | Status: AC

## 2019-11-11 MED FILL — PANTOPRAZOLE 40 MG TABLET,DELAYED RELEASE: 30 days supply | Qty: 30 | Fill #0 | Status: AC

## 2019-11-11 MED FILL — ONDANSETRON 4 MG DISINTEGRATING TABLET: 4 days supply | Qty: 10 | Fill #0 | Status: AC

## 2019-11-12 MED ORDER — BUDESONIDE DR - ER 3 MG CAPSULE,DELAYED,EXTENDED RELEASE
ORAL_CAPSULE | Freq: Every day | ORAL | 11 refills | 30.00000 days | Status: CP
Start: 2019-11-12 — End: 2020-11-11
  Filled 2019-11-11: qty 90, 30d supply, fill #0

## 2019-11-12 MED ORDER — PANTOPRAZOLE 40 MG TABLET,DELAYED RELEASE
ORAL_TABLET | Freq: Every day | ORAL | 0 refills | 30.00000 days | Status: CP
Start: 2019-11-12 — End: 2019-12-12
  Filled 2019-11-11: qty 30, 30d supply, fill #0

## 2019-11-18 ENCOUNTER — Ambulatory Visit: Admit: 2019-11-18 | Discharge: 2019-11-19 | Payer: MEDICAID

## 2019-11-18 DIAGNOSIS — K50019 Crohn's disease of small intestine with unspecified complications: Principal | ICD-10-CM

## 2019-12-09 ENCOUNTER — Ambulatory Visit: Admit: 2019-12-09 | Discharge: 2019-12-10 | Payer: MEDICAID

## 2019-12-09 DIAGNOSIS — K50019 Crohn's disease of small intestine with unspecified complications: Principal | ICD-10-CM

## 2020-11-12 ENCOUNTER — Ambulatory Visit: Admit: 2020-11-12 | Payer: MEDICAID

## 2020-12-17 ENCOUNTER — Ambulatory Visit: Admit: 2020-12-17 | Discharge: 2020-12-18 | Payer: MEDICAID

## 2020-12-17 DIAGNOSIS — K50919 Crohn's disease, unspecified, with unspecified complications: Principal | ICD-10-CM

## 2021-01-31 ENCOUNTER — Emergency Department: Admit: 2021-01-31 | Payer: MEDICAID | Primary: Family Medicine

## 2021-01-31 ENCOUNTER — Emergency Department: Payer: MEDICAID | Primary: Family Medicine

## 2021-01-31 ENCOUNTER — Inpatient Hospital Stay
Admit: 2021-01-31 | Discharge: 2021-02-04 | Disposition: A | Discharge status: Home / Self Care | Payer: MEDICAID | Attending: Family Medicine | Admitting: Family Medicine

## 2021-01-31 DIAGNOSIS — K50912 Crohn's disease, unspecified, with intestinal obstruction: Secondary | ICD-10-CM

## 2021-01-31 DIAGNOSIS — K56609 Unspecified intestinal obstruction, unspecified as to partial versus complete obstruction: Secondary | ICD-10-CM

## 2021-01-31 LAB — URINALYSIS W/ RFLX MICROSCOPIC
Bilirubin, Urine: NEGATIVE
Bilirubin: NEGATIVE
Blood, Urine: NEGATIVE
Blood: NEGATIVE
Glucose, Ur: NEGATIVE mg/dL
Glucose: NEGATIVE mg/dL
Ketone: NEGATIVE mg/dL
Ketones, Urine: NEGATIVE mg/dL
Leukocyte Esterase, Urine: NEGATIVE
Leukocyte Esterase: NEGATIVE
Nitrite, Urine: NEGATIVE
Nitrites: NEGATIVE
Specific Gravity, UA: 1.026 (ref 1.005–1.030)
Specific gravity: 1.026 (ref 1.005–1.030)
Urobilinogen, UA, POCT: 0.2 EU/dL (ref 0.2–1.0)
Urobilinogen: 0.2 EU/dL (ref 0.2–1.0)
pH (UA): 6.5 (ref 5.0–8.0)
pH, UA: 6.5 (ref 5.0–8.0)

## 2021-01-31 LAB — CBC WITH AUTO DIFFERENTIAL
Basophils %: 1 % (ref 0–2)
Basophils Absolute: 0.1 10*3/uL (ref 0.0–0.1)
Eosinophils %: 1 % (ref 0–5)
Eosinophils Absolute: 0.1 10*3/uL (ref 0.0–0.4)
Granulocyte Absolute Count: 0.1 10*3/uL — ABNORMAL HIGH (ref 0.00–0.04)
Hematocrit: 43.6 % (ref 36.0–48.0)
Hemoglobin: 14 g/dL (ref 13.0–16.0)
Immature Granulocytes: 1 % — ABNORMAL HIGH (ref 0.0–0.5)
Lymphocytes %: 22 % (ref 21–52)
Lymphocytes Absolute: 2 10*3/uL (ref 0.9–3.6)
MCH: 27.8 PG (ref 24.0–34.0)
MCHC: 32.1 g/dL (ref 31.0–37.0)
MCV: 86.5 FL (ref 78.0–100.0)
MPV: 9.8 FL (ref 9.2–11.8)
Monocytes %: 17 % — ABNORMAL HIGH (ref 3–10)
Monocytes Absolute: 1.6 10*3/uL — ABNORMAL HIGH (ref 0.05–1.2)
NRBC Absolute: 0 10*3/uL (ref 0.00–0.01)
Neutrophils %: 59 % (ref 40–73)
Neutrophils Absolute: 5.4 10*3/uL (ref 1.8–8.0)
Nucleated RBCs: 0 PER 100 WBC
Platelets: 270 10*3/uL (ref 135–420)
RBC: 5.04 M/uL (ref 4.35–5.65)
RDW: 14.9 % — ABNORMAL HIGH (ref 11.6–14.5)
WBC: 9.2 10*3/uL (ref 4.6–13.2)

## 2021-01-31 LAB — URINE MICROSCOPIC ONLY
RBC, UA: NEGATIVE /hpf (ref 0–5)
RBC: NEGATIVE /hpf (ref 0–5)
WBC, UA: 1 /hpf (ref 0–4)
WBC: 1 /hpf (ref 0–4)

## 2021-01-31 LAB — COMPREHENSIVE METABOLIC PANEL
ALT: 34 U/L (ref 16–61)
AST: 18 U/L (ref 10–38)
Albumin/Globulin Ratio: 0.7 — ABNORMAL LOW (ref 0.8–1.7)
Albumin: 2.8 g/dL — ABNORMAL LOW (ref 3.4–5.0)
Alkaline Phosphatase: 106 U/L (ref 45–117)
Anion Gap: 7 mmol/L (ref 3.0–18)
BUN: 11 MG/DL (ref 7.0–18)
Bun/Cre Ratio: 11 — ABNORMAL LOW (ref 12–20)
CO2: 28 mmol/L (ref 21–32)
Calcium: 8.9 MG/DL (ref 8.5–10.1)
Chloride: 107 mmol/L (ref 100–111)
Creatinine: 0.99 MG/DL (ref 0.6–1.3)
EGFR IF NonAfrican American: >60 mL/min/{1.73_m2} (ref >60)
GFR African American: >60 mL/min/{1.73_m2} (ref >60)
Globulin: 4 g/dL (ref 2.0–4.0)
Glucose: 102 mg/dL — ABNORMAL HIGH (ref 74–99)
Potassium: 3.5 mmol/L (ref 3.5–5.5)
Sodium: 142 mmol/L (ref 136–145)
Total Bilirubin: 0.4 MG/DL (ref 0.2–1.0)
Total Protein: 6.8 g/dL (ref 6.4–8.2)

## 2021-01-31 LAB — METABOLIC PANEL, COMPREHENSIVE
A-G Ratio: 0.7 — ABNORMAL LOW (ref 0.8–1.7)
ALT (SGPT): 34 U/L (ref 16–61)
AST (SGOT): 18 U/L (ref 10–38)
Albumin: 2.8 g/dL — ABNORMAL LOW (ref 3.4–5.0)
Alk. phosphatase: 106 U/L (ref 45–117)
Anion gap: 7 mmol/L (ref 3.0–18)
BUN/Creatinine ratio: 11 — ABNORMAL LOW (ref 12–20)
BUN: 11 MG/DL (ref 7.0–18)
Bilirubin, total: 0.4 MG/DL (ref 0.2–1.0)
CO2: 28 mmol/L (ref 21–32)
Calcium: 8.9 MG/DL (ref 8.5–10.1)
Chloride: 107 mmol/L (ref 100–111)
Creatinine: 0.99 MG/DL (ref 0.6–1.3)
GFR est AA: >60 mL/min/{1.73_m2} (ref >60)
GFR est non-AA: >60 mL/min/{1.73_m2} (ref >60)
Globulin: 4 g/dL (ref 2.0–4.0)
Glucose: 102 mg/dL — ABNORMAL HIGH (ref 74–99)
Potassium: 3.5 mmol/L (ref 3.5–5.5)
Protein, total: 6.8 g/dL (ref 6.4–8.2)
Sodium: 142 mmol/L (ref 136–145)

## 2021-01-31 LAB — CBC WITH AUTOMATED DIFF
ABS. BASOPHILS: 0.1 10*3/uL (ref 0.0–0.1)
ABS. EOSINOPHILS: 0.1 10*3/uL (ref 0.0–0.4)
ABS. IMM. GRANS.: 0.1 10*3/uL — ABNORMAL HIGH (ref 0.00–0.04)
ABS. LYMPHOCYTES: 2 10*3/uL (ref 0.9–3.6)
ABS. MONOCYTES: 1.6 10*3/uL — ABNORMAL HIGH (ref 0.05–1.2)
ABS. NEUTROPHILS: 5.4 10*3/uL (ref 1.8–8.0)
ABSOLUTE NRBC: 0 10*3/uL (ref 0.00–0.01)
BASOPHILS: 1 % (ref 0–2)
EOSINOPHILS: 1 % (ref 0–5)
HCT: 43.6 % (ref 36.0–48.0)
HGB: 14 g/dL (ref 13.0–16.0)
IMMATURE GRANULOCYTES: 1 % — ABNORMAL HIGH (ref 0.0–0.5)
LYMPHOCYTES: 22 % (ref 21–52)
MCH: 27.8 PG (ref 24.0–34.0)
MCHC: 32.1 g/dL (ref 31.0–37.0)
MCV: 86.5 FL (ref 78.0–100.0)
MONOCYTES: 17 % — ABNORMAL HIGH (ref 3–10)
MPV: 9.8 FL (ref 9.2–11.8)
NEUTROPHILS: 59 % (ref 40–73)
NRBC: 0 PER 100 WBC
PLATELET: 270 10*3/uL (ref 135–420)
RBC: 5.04 M/uL (ref 4.35–5.65)
RDW: 14.9 % — ABNORMAL HIGH (ref 11.6–14.5)
WBC: 9.2 10*3/uL (ref 4.6–13.2)

## 2021-01-31 LAB — LIPASE
Lipase: 164 U/L (ref 73–393)
Lipase: 164 U/L (ref 73–393)

## 2021-01-31 LAB — MAGNESIUM
Magnesium: 1.8 mg/dL (ref 1.6–2.6)
Magnesium: 1.8 mg/dL (ref 1.6–2.6)

## 2021-01-31 LAB — POC LACTIC ACID
Lactic Acid (POC): 2.13 mmol/L — CR (ref 0.40–2.00)
Lactic Acid (POC): 1.03 mmol/L (ref 0.40–2.00)

## 2021-01-31 LAB — POCT LACTIC ACID
POC Lactic Acid: 2.13 mmol/L (ref 0.40–2.00)
POC Lactic Acid: 1.03 mmol/L (ref 0.40–2.00)

## 2021-01-31 MED ORDER — PROCHLORPERAZINE EDISYLATE 5 MG/ML INJECTION
5 mg/mL | INTRAMUSCULAR | Status: AC
Start: 2021-01-31 — End: 2021-01-31
  Administered 2021-01-31: 15:00:00 via INTRAVENOUS

## 2021-01-31 MED ORDER — DIATRIZOATE MEGLUMINE & SODIUM 66 %-10 % ORAL SOLN
66-10 % | Freq: Once | ORAL | Status: AC
Start: 2021-01-31 — End: 2021-01-31
  Administered 2021-01-31: 15:00:00 via ORAL

## 2021-01-31 MED ORDER — SODIUM CHLORIDE 0.9 % IV PIGGY BACK
3.375 gram | Freq: Three times a day (TID) | INTRAVENOUS | Status: DC
Start: 2021-01-31 — End: 2021-01-31

## 2021-01-31 MED ORDER — SODIUM CHLORIDE 0.9% BOLUS IV
0.9 % | Freq: Once | INTRAVENOUS | Status: AC
Start: 2021-01-31 — End: 2021-01-31
  Administered 2021-01-31: 14:00:00 via INTRAVENOUS

## 2021-01-31 MED ORDER — ONDANSETRON 4 MG TAB, RAPID DISSOLVE
4 mg | Freq: Three times a day (TID) | ORAL | Status: DC | PRN
Start: 2021-01-31 — End: 2021-02-04
  Administered 2021-02-01 – 2021-02-04 (×3): via ORAL

## 2021-01-31 MED ORDER — ACETAMINOPHEN 325 MG TABLET
325 mg | Freq: Four times a day (QID) | ORAL | Status: DC | PRN
Start: 2021-01-31 — End: 2021-02-04
  Administered 2021-02-03: via ORAL

## 2021-01-31 MED ORDER — MORPHINE 4 MG/ML INTRAVENOUS SOLUTION
4 mg/mL | INTRAVENOUS | Status: AC
Start: 2021-01-31 — End: 2021-01-31
  Administered 2021-01-31: 18:00:00 via INTRAVENOUS

## 2021-01-31 MED ORDER — SODIUM CHLORIDE 0.9 % IJ SYRG
INTRAMUSCULAR | Status: DC | PRN
Start: 2021-01-31 — End: 2021-02-04

## 2021-01-31 MED ORDER — MORPHINE 4 MG/ML INTRAVENOUS SOLUTION
4 mg/mL | INTRAVENOUS | Status: AC
Start: 2021-01-31 — End: 2021-01-31
  Administered 2021-01-31: 15:00:00 via INTRAVENOUS

## 2021-01-31 MED ORDER — PIPERACILLIN-TAZOBACTAM 4.5 GRAM IV SOLR
4.5 gram | INTRAVENOUS | Status: AC
Start: 2021-01-31 — End: 2021-01-31
  Administered 2021-01-31: 14:00:00 via INTRAVENOUS

## 2021-01-31 MED ORDER — SODIUM CHLORIDE 0.9 % IV
25 mg/mL | Freq: Once | INTRAVENOUS | Status: DC
Start: 2021-01-31 — End: 2021-01-31

## 2021-01-31 MED ORDER — IOPAMIDOL 61 % IV SOLN
300 mg iodine /mL (61 %) | Freq: Once | INTRAVENOUS | Status: AC
Start: 2021-01-31 — End: 2021-01-31
  Administered 2021-01-31: 17:00:00 via INTRAVENOUS

## 2021-01-31 MED ORDER — SODIUM CHLORIDE 0.9 % IJ SYRG
Freq: Three times a day (TID) | INTRAMUSCULAR | Status: DC
Start: 2021-01-31 — End: 2021-02-04
  Administered 2021-01-31 – 2021-02-04 (×14): via INTRAVENOUS

## 2021-01-31 MED ORDER — FAMOTIDINE 20 MG TAB
20 mg | ORAL | Status: DC
Start: 2021-01-31 — End: 2021-01-31

## 2021-01-31 MED ORDER — DIPHENHYDRAMINE HCL 50 MG/ML IJ SOLN
50 mg/mL | INTRAMUSCULAR | Status: DC | PRN
Start: 2021-01-31 — End: 2021-02-04
  Administered 2021-01-31 – 2021-02-04 (×18): via INTRAVENOUS

## 2021-01-31 MED ORDER — KETOROLAC TROMETHAMINE 15 MG/ML INJECTION
15 mg/mL | Freq: Four times a day (QID) | INTRAMUSCULAR | Status: DC | PRN
Start: 2021-01-31 — End: 2021-02-04
  Administered 2021-02-01 – 2021-02-04 (×14): via INTRAVENOUS

## 2021-01-31 MED ORDER — ONDANSETRON (PF) 4 MG/2 ML INJECTION
4 mg/2 mL | Freq: Four times a day (QID) | INTRAMUSCULAR | Status: DC | PRN
Start: 2021-01-31 — End: 2021-02-04
  Administered 2021-01-31 – 2021-02-01 (×2): via INTRAVENOUS

## 2021-01-31 MED ORDER — MORPHINE 4 MG/ML INTRAVENOUS SOLUTION
4 mg/mL | INTRAVENOUS | Status: AC
Start: 2021-01-31 — End: 2021-01-31
  Administered 2021-01-31: 13:00:00 via INTRAVENOUS

## 2021-01-31 MED ORDER — ONDANSETRON (PF) 4 MG/2 ML INJECTION
4 mg/2 mL | INTRAMUSCULAR | Status: AC
Start: 2021-01-31 — End: 2021-01-31
  Administered 2021-01-31: 13:00:00 via INTRAVENOUS

## 2021-01-31 MED ORDER — SODIUM CHLORIDE 0.9% BOLUS IV
0.9 % | Freq: Once | INTRAVENOUS | Status: AC
Start: 2021-01-31 — End: 2021-01-31
  Administered 2021-01-31: 18:00:00 via INTRAVENOUS

## 2021-01-31 MED ORDER — PROCHLORPERAZINE EDISYLATE 5 MG/ML INJECTION
5 mg/mL | INTRAMUSCULAR | Status: AC
Start: 2021-01-31 — End: 2021-01-31
  Administered 2021-01-31: 18:00:00 via INTRAVENOUS

## 2021-01-31 MED ORDER — FAMOTIDINE (PF) 20 MG/2 ML IV
20 mg/2 mL | INTRAVENOUS | Status: AC
Start: 2021-01-31 — End: 2021-01-31
  Administered 2021-01-31: 15:00:00 via INTRAVENOUS

## 2021-01-31 MED ORDER — LACTATED RINGERS IV
INTRAVENOUS | Status: DC
Start: 2021-01-31 — End: 2021-02-04
  Administered 2021-02-01 – 2021-02-03 (×3): via INTRAVENOUS

## 2021-01-31 MED ORDER — ACETAMINOPHEN 650 MG RECTAL SUPPOSITORY
650 mg | Freq: Four times a day (QID) | RECTAL | Status: DC | PRN
Start: 2021-01-31 — End: 2021-02-04

## 2021-01-31 MED FILL — PROCHLORPERAZINE EDISYLATE 5 MG/ML INJECTION: 5 mg/mL | INTRAMUSCULAR | Qty: 2

## 2021-01-31 MED FILL — BD POSIFLUSH NORMAL SALINE 0.9 % INJECTION SYRINGE: INTRAMUSCULAR | Qty: 40

## 2021-01-31 MED FILL — BD POSIFLUSH NORMAL SALINE 0.9 % INJECTION SYRINGE: INTRAMUSCULAR | Qty: 10

## 2021-01-31 MED FILL — ONDANSETRON (PF) 4 MG/2 ML INJECTION: 4 mg/2 mL | INTRAMUSCULAR | Qty: 2

## 2021-01-31 MED FILL — MORPHINE 4 MG/ML SYRINGE: 4 mg/mL | INTRAMUSCULAR | Qty: 1

## 2021-01-31 MED FILL — PIPERACILLIN-TAZOBACTAM 4.5 GRAM IV SOLR: 4.5 gram | INTRAVENOUS | Qty: 4.5

## 2021-01-31 MED FILL — LACTATED RINGERS IV: INTRAVENOUS | Qty: 1000

## 2021-01-31 MED FILL — FAMOTIDINE 20 MG TAB: 20 mg | ORAL | Qty: 1

## 2021-01-31 MED FILL — SODIUM CHLORIDE 0.9 % IV: INTRAVENOUS | Qty: 1000

## 2021-01-31 MED FILL — ISOVUE-300  61 % INTRAVENOUS SOLUTION: 300 mg iodine /mL (61 %) | INTRAVENOUS | Qty: 100

## 2021-01-31 MED FILL — MD-GASTROVIEW 66 %-10 % ORAL SOLUTION: 66-10 % | ORAL | Qty: 30

## 2021-01-31 MED FILL — DIPHENHYDRAMINE HCL 50 MG/ML IJ SOLN: 50 mg/mL | INTRAMUSCULAR | Qty: 1

## 2021-01-31 MED FILL — FAMOTIDINE (PF) 20 MG/2 ML IV: 20 mg/2 mL | INTRAVENOUS | Qty: 2

## 2021-01-31 NOTE — Progress Notes (Signed)
Problem: Falls - Risk of  Goal: *Absence of Falls  Description  Document Jonathan Yoder Fall Risk and appropriate interventions in the flowsheet.  Outcome: Progressing Towards Goal  Note:   Fall Risk Interventions:  Mobility Interventions: Utilize walker, cane, or other assistive device         Medication Interventions: Teach patient to arise slowly, Patient to call before getting OOB                   Problem: Patient Education: Go to Patient Education Activity  Goal: Patient/Family Education  Outcome: Progressing Towards Goal

## 2021-01-31 NOTE — ED Notes (Signed)
Patient is drinking contrast starting now.

## 2021-01-31 NOTE — ED Notes (Signed)
Assumed care of patient. Laying in bed, eyes closed, blankets covering legs, torso and up to shoulders. IV flushed, saline locked. Skin is warm and dry to touch. No respiratory distress observed. Will continue to monitor.

## 2021-01-31 NOTE — ED Notes (Signed)
Dr. Perlie Gold is aware that LR was not hung. Normal saline will finish first.

## 2021-01-31 NOTE — ED Notes (Signed)
Transport has been requested.

## 2021-01-31 NOTE — ED Notes (Signed)
Transfer of care report called to Bed [address], Floor [address] [place], to [employer].

## 2021-01-31 NOTE — ED Notes (Signed)
Patient finished with contrast at 1130. Lauren, Rn from CT is aware.

## 2021-01-31 NOTE — ED Notes (Signed)
Patient refused contrast. Endorses he is in pain and is sick to his stomach. Dr. Merri Brunette is aware.

## 2021-01-31 NOTE — ED Notes (Signed)
Purposeful rounding completed:    Side rails up x 2:  YES  Bed in low position and wheels locked: YES  Call bell within reach: YES  Comfort addressed: YES    Toileting needs addressed: YES  Plan of care reviewed/updated with patient and or family members: YES  IV site assessed: YES  Pain assessed and addressed: YES, 3    Patient laying in bed, eyes closed, laying on right side. No respiratory distress observed.

## 2021-01-31 NOTE — Progress Notes (Signed)
Primary Nurse Rolene Course, RN and Fayrene Fearing, RN performed a dual skin assessment on this patient No impairment noted  Braden score is 21

## 2021-01-31 NOTE — ED Notes (Signed)
Purposeful rounding completed:    Side rails up x 2:  YES  Bed in low position and wheels locked: YES  Call bell within reach: YES  Comfort addressed: YES    Toileting needs addressed: YES  Plan of care reviewed/updated with patient and or family members: YES  IV site assessed: YES  Pain assessed and addressed: YES, 3    Patient laying in bed, eyes closed, laying on left side. No respiratory distress observed.

## 2021-01-31 NOTE — ED Notes (Signed)
Patient with a history of Crohn's disease started with nausea and vomiting last night.  Patient also complains of constipation.  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

## 2021-01-31 NOTE — ED Notes (Signed)
NG tube has been placed and KUB has been ordered for placement.

## 2021-01-31 NOTE — ED Provider Notes (Signed)
ED Provider Notes by Nuala Alpha, [place] at 01/31/21 3474                Author: Nuala Alpha, PA  Service: EMERGENCY  Author Type: Physician Assistant       Filed: 01/31/21 1416  Date of Service: 01/31/21 0826  Status: Attested           Editor: Nuala Alpha, [place] (Physician Assistant)  Cosigner: Chancy Hurter, MD at 01/31/21 1425          Attestation signed by Chancy Hurter, MD at 01/31/21 1425          I was personally available for consultation in the emergency department.  I have reviewed the chart and agree with the documentation recorded by the [facility], including  the assessment, treatment plan, and disposition.   Chancy Hurter, MD                                 EMERGENCY DEPARTMENT HISTORY AND PHYSICAL EXAM      8:26 AM         Date: 01/31/2021   Patient Name: Jonathan Yoder        History of Presenting Illness          Chief Complaint       Patient presents with        ?  Abdominal Pain           History Provided By: Patient      Additional History (Context): Nicholai Willette  is a 45 y.o. male with  hx of Crohn's disease, SBO, and other noted PMH who presents with complaint of diffuse abdominal pain associated with nausea, vomiting, and constipation x2 days.  Patient notes symptoms feel typical  of Crohn's flares in the past.  Denies fever chills, chest pain, shortness of breath, melena, bright red blood per rectum, dysuria, hematuria.  Patient notes he moved to the area 2 weeks ago and does not have GI or general surgery follow-up established.       Last PO intake was last night.       PCP: Other, Phys, MD        Current Facility-Administered Medications             Medication  Dose  Route  Frequency  Provider  Last Rate  Last Admin              ?  sodium chloride (NS) flush 5-10 mL   5-10 mL  IntraVENous  PRN  Nuala Alpha, PA           ?  piperacillin-tazobactam (ZOSYN) 3.375 g in 0.9% sodium chloride (MBP/ADV) 100 mL MBP   3.375 g  IntraVENous  Q8H  Trenton Gammon, [place], [place]                     ?  sodium chloride 0.9 % bolus infusion 1,000 mL   1,000 mL  IntraVENous  Unice Bailey, [place], PA  1,000 mL/hr at 01/31/21 1357  1,000 mL at 01/31/21 1357             Past History        Past Medical History:   No past medical history on file.      Past Surgical History:   No past surgical history on file.      Family History:   No family history  on file.      Social History:     Social History          Tobacco Use         ?  Smoking status:  Not on file     ?  Smokeless tobacco:  Not on file       Substance Use Topics         ?  Alcohol use:  Not on file         ?  Drug use:  Not on file           Allergies:   No Known Allergies           Review of Systems           Review of Systems    Constitutional: Negative for chills and fever.    Respiratory: Negative for shortness of breath.     Cardiovascular: Negative for chest pain.    Gastrointestinal: Positive for abdominal pain, constipation , nausea and vomiting.     Skin: Negative for rash.    Neurological: Negative for weakness.    All other systems reviewed and are negative.              Physical Exam        Visit Vitals      BP  101/65     Pulse  84     Temp  98.4 ??F (36.9 ??C)     Resp  13     Ht  5' 8" (1.727 m)     Wt  58.5 kg (129 lb)     SpO2  98%        BMI  19.61 kg/m??              Physical Exam   Vitals and nursing note reviewed.   Constitutional:        General: He is not in acute distress.     Appearance: He is well-developed. He is not ill-appearing, toxic-appearing or diaphoretic.    HENT:       Head: Normocephalic and atraumatic.   Cardiovascular :       Rate and Rhythm: Normal rate and regular rhythm.      Heart sounds: Normal heart sounds. No murmur heard.   No friction rub. No gallop.     Pulmonary:       Effort: Pulmonary effort is normal. No respiratory distress.      Breath sounds: Normal breath sounds. No wheezing or rales.    Abdominal:      General: A surgical scar is present.      Tenderness: There  is generalized abdominal  tenderness  (moderate). There is no guarding or rebound.      Hernia: A hernia  is present.     Musculoskeletal:          General: Normal range of motion.      Cervical back: Normal range of motion and neck supple.    Skin:      General: Skin is warm.      Findings: No rash.   Neurological :       Mental Status: He is alert.                 Diagnostic Study Results        Labs -     Recent Results (from the past 12 hour(s))     CBC WITH AUTOMATED DIFF  Collection Time: 01/31/21  8:45 AM         Result  Value  Ref Range            WBC  9.2  4.6 - 13.2 K/uL       RBC  5.04  4.35 - 5.65 M/uL       HGB  14.0  13.0 - 16.0 g/dL       HCT  43.6  36.0 - 48.0 %       MCV  86.5  78.0 - 100.0 FL       MCH  27.8  24.0 - 34.0 PG       MCHC  32.1  31.0 - 37.0 g/dL       RDW  14.9 (H)  11.6 - 14.5 %       PLATELET  270  135 - 420 K/uL       MPV  9.8  9.2 - 11.8 FL       NRBC  0.0  0 PER 100 WBC       ABSOLUTE NRBC  0.00  0.00 - 0.01 K/uL       NEUTROPHILS  59  40 - 73 %       LYMPHOCYTES  22  21 - 52 %       MONOCYTES  17 (H)  3 - 10 %       EOSINOPHILS  1  0 - 5 %       BASOPHILS  1  0 - 2 %       IMMATURE GRANULOCYTES  1 (H)  0.0 - 0.5 %       ABS. NEUTROPHILS  5.4  1.8 - 8.0 K/UL       ABS. LYMPHOCYTES  2.0  0.9 - 3.6 K/UL       ABS. MONOCYTES  1.6 (H)  0.05 - 1.2 K/UL       ABS. EOSINOPHILS  0.1  0.0 - 0.4 K/UL       ABS. BASOPHILS  0.1  0.0 - 0.1 K/UL       ABS. IMM. GRANS.  0.1 (H)  0.00 - 0.04 K/UL       DF  AUTOMATED          METABOLIC PANEL, COMPREHENSIVE          Collection Time: 01/31/21  8:45 AM         Result  Value  Ref Range            Sodium  142  136 - 145 mmol/L       Potassium  3.5  3.5 - 5.5 mmol/L       Chloride  107  100 - 111 mmol/L       CO2  28  21 - 32 mmol/L       Anion gap  7  3.0 - 18 mmol/L       Glucose  102 (H)  74 - 99 mg/dL       BUN  11  7.0 - 18 MG/DL       Creatinine  0.99  0.6 - 1.3 MG/DL       BUN/Creatinine ratio  11 (L)  12 - 20         GFR est AA  >60  >60 ml/min/1.35m       GFR  est non-AA  >60  >60 ml/min/1.759m      Calcium  8.9  8.5 - 10.1 MG/DL       Bilirubin, total  0.4  0.2 - 1.0 MG/DL       ALT (SGPT)  34  16 - 61 U/L       AST (SGOT)  18  10 - 38 U/L       Alk. phosphatase  106  45 - 117 U/L       Protein, total  6.8  6.4 - 8.2 g/dL       Albumin  2.8 (L)  3.4 - 5.0 g/dL       Globulin  4.0  2.0 - 4.0 g/dL       A-G Ratio  0.7 (L)  0.8 - 1.7         LIPASE          Collection Time: 01/31/21  8:45 AM         Result  Value  Ref Range            Lipase  164  73 - 393 U/L       MAGNESIUM          Collection Time: 01/31/21  8:45 AM         Result  Value  Ref Range            Magnesium  1.8  1.6 - 2.6 mg/dL       POC LACTIC ACID          Collection Time: 01/31/21  8:49 AM         Result  Value  Ref Range            Lactic Acid (POC)  2.13 (HH)  0.40 - 2.00 mmol/L       EKG, 12 LEAD, INITIAL          Collection Time: 01/31/21  8:53 AM         Result  Value  Ref Range            Ventricular Rate  98  BPM       Atrial Rate  98  BPM       P-R Interval  138  ms       QRS Duration  84  ms       Q-T Interval  334  ms       QTC Calculation (Bezet)  426  ms       Calculated P Axis  79  degrees       Calculated R Axis  64  degrees       Calculated T Axis  76  degrees       Diagnosis                 Normal sinus rhythm   Normal ECG   No previous ECGs available          URINALYSIS W/ RFLX MICROSCOPIC          Collection Time: 01/31/21  9:00 AM         Result  Value  Ref Range            Color  YELLOW          Appearance  CLEAR          Specific gravity  1.026  1.005 - 1.030         pH (UA)  6.5  5.0 - 8.0         Protein  TRACE (A)  NEG mg/dL       Glucose  Negative  NEG mg/dL       Ketone  Negative  NEG mg/dL       Bilirubin  Negative  NEG         Blood  Negative  NEG         Urobilinogen  0.2  0.2 - 1.0 EU/dL       Nitrites  Negative  NEG         Leukocyte Esterase  Negative  NEG         URINE MICROSCOPIC ONLY          Collection Time: 01/31/21  9:00 AM         Result  Value  Ref Range             WBC  1 to 4  0 - 4 /hpf       RBC  Negative  0 - 5 /hpf       Epithelial cells  FEW  0 - 5 /lpf       Bacteria  FEW (A)  NEG /hpf       Mucus  2+ (A)  NEG /lpf       CA Oxalate crystals  2+ (A)  NEG       POC LACTIC ACID          Collection Time: 01/31/21  1:04 PM         Result  Value  Ref Range            Lactic Acid (POC)  1.03  0.40 - 2.00 mmol/L           Radiologic Studies -      CT ABD PELV W CONT       Final Result     1.  Small bowel obstruction, with potential transition point/stricture at the     small bowel anastomosis distal ileum, though colon is not decompressed. Unable     to compare the degree of small bowel dilation given lack of prior imaging but     small bowel was reportedly dilated. The dilated small bowel loops have a     thickened wall. It is unclear if this is inflammation related to Crohn's or     secondary to dilation; ischemia is possible. No pneumatosis.     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     3.  Mesenteric lymphadenopathy which is likely reactive.     4.  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     5.  Hypovolemia.     6.  Mild anasarca.     7.  Avascular necrosis right femoral head.            XR CHEST PORT       Final Result          No acute pulmonic disease.                              Medical Decision Making     I am the first provider for this patient.      I reviewed the vital signs, available nursing notes, past medical history, past surgical history, family history and social history.      Vital Signs-Reviewed the patient's vital signs.      Records Reviewed: Nursing  Notes, Old Medical Records and Previous electrocardiograms  (Time of Review: 8:26 AM)   Surgeon at [facility] 12/17/20:   ASSESSMENT:   Refujio Haymer is a 45 y.o. male with a PMH of Crohn's s/p ileocectomy, s/p R hemicolectomy and EI (09/02/2019), and now s/p colostomy takedown and reanastomosis  (11/01/19). His hospital course was  complicated by urosepsis vs line sepsis, requiring transfer to ICU, pressor support, and broad spectrum antibiotics. He recovered and was discharged in stable condition on 11/11/19. He was last seen in clinic on 12/10/19  for a wound check. He presents today for abdominal pain with meals and decreased PO intake. He has presented to the ED multiple times at other hospitals for similar symptoms.    PLAN:   - Given reassuring abdominal exam and stable vital signs,  no further imaging obtained  - CMP/Mg/Phos obtained, no significant abnormalities noted  - Recommend patient see his GI physician for medical management of Crohn's  - No scheduled follow-up with GI surgery required      ED Course: Progress Notes, Reevaluation, and Consults:   9:15 AM: Lactic elevated, sepsis order set placed.    10:37 AM: pt notes persistent pain and nausea. Refusing PO contrast. Will order another dose of pain medication and nausea medication.    1:58 PM: CT resulted.    Discussed care with general surgery, Dr. Kerry Dory, general surgery attending, recommends NG tube, admit to hospitalist, will consult.   Discussed care with Dr. Nevada Crane, hospitalist, who accepts patient for admission to medical bed.    2:15 PM: NG tube placed by nurse Heidi.       Provider Notes (Medical Decision Making): 45 year old male with history of Crohn's disease, SBO, multiple previous surgeries who presents to the ED due to diffuse abdominal pain associated with  nausea, vomiting, and constipation.  Afebrile, nontoxic-appearing.  Abdomen with moderate generalized tenderness without rebound or guarding.  Labs essentially unremarkable except for mild lactic acid elevation.  CT abdomen pelvis with p.o. and IV contrast  demonstrates small bowel obstruction, lymphadenopathy, mild anasarca.  Patient without emesis while in the ED.  Discussed case with general surgery, hospitalist.  Will be admitted to medical service for further assessment.            Diagnosis        Clinical  Impression:       1.  Small bowel obstruction (HCC)         2.  Elevated lactic acid level            Disposition: admission.         Follow-up Information      None                   Patient's Medications          No medications on file           Dictation disclaimer:  Please note that this dictation was completed with Dragon, the computer voice recognition software.  Quite often unanticipated grammatical, syntax, homophones, and other  interpretive errors are inadvertently transcribed by the computer software.  Please disregard these errors.  Please excuse any errors that have escaped final proofreading.

## 2021-01-31 NOTE — Progress Notes (Signed)
Progress Notes by Andres Ege, PA-C at 01/31/21 1622                Author: Andres Ege, PA-C  Service: Gastroenterology  Author Type: Physician Assistant       Filed: 01/31/21 1801  Date of Service: 01/31/21 1622  Status: Signed           Editor: Curtis Sites (Physician Assistant)  Cosigner: Philemon Kingdom, MD at 02/01/21 [phone]                    [email or web address]   [phone]      GASTROENTEROLOGY Brief Note           Impression:        Full consult to follow tomorrow      Jonathan Yoder is a 45 y.o. male w/ PMH Crohn's disease w/ extensive surgeries/hx as noted below, COPD, DVT (2011), bipolar, cannabis abuse, and drug seeking behavior who  I am being asked to see in consultation for an opinion regarding SBO in the setting of Crohn's disease. Patient presented to the ED 6/30 with diffuse 5/10 abd pain w/ associated n/v and obstipation. Sx consistent w/ prior Crohn's flares. Denies f/c, black/bloody  stools, or weight loss. ED w/u significant for elevated lactic acid, CT revealing SBO w/ potential transition point/stricture at SB anastomosis distal ileum. NG tube placed and general surgery consulted for further management.       Reports he was reviously on daily mesalamine and Humira w/ injections every 2 weeks but ran out of medication 4wks ago; contradictory chart review as noted below. Appears to have recently moved to the area within the last 7d. There are concerns of possible  homelessness and significant poor compliance.       IBD summary of care per chart review:   ??  Surgical hx: h/o SBO, s/p ileocectomy (2003), s/p R hemicolectomy and EI (09/02/2019),  and now s/p colostomy takedown and reanastomosis (11/01/19)   ??  Med hx: Most recently on daily mesalamine and Humira w/ injections every 2 weeks but ran out of medication 4wks ago.  On Stelara in 2021, Remicade prior but had to d/c secondary to poor IV access. Patient with significant history of poor compliance with several ED and hospital  admissions in past. Multiple missed GI clinic visits.    ??  01/22/21 Admission: Appears to have just been d/c 6/23 from [facility] in [facility] - admitted  6/21 for abd pain w/ n/v and diarrhea x4-5d. GI consulted and started on prednisone. D/c w/ OP f/u.   ??  01/22/21 OP [facility] GI visit - Refill of Humira and pantoprazole sent. Was prev seen as OP 12/2019 and started on Stelara but patient was lost to  f/u    ??  12/2019 OP General Surgery note w/ mention of most recent hospital admission 11/2019 complicated by urosepsis/line sepsis requiring ICU care, d/c  4/9.    ??  03/2015 colonoscopy performed during hospital admission significant for non-patent surgical anastomosis characterized by congestion with friable  mucosa, ulceration and severe stenosis with normal mucosa in the rectum/colon. Patient started on IV solumedrol and GI surgery was consulted. Patient chose to defer ileostomy, subsequently given dose of remicade and d/c w/ mercaptopurine and prednisone  taper.   ??  2014 - documentation of regular remicade infusions          6/30 CT Abd Pelvis w/ IV Contrast IMPRESSION:   1.  Small bowel obstruction, with  potential transition point/stricture at the   small bowel anastomosis distal ileum, though colon is not decompressed. Unable to compare the degree of small bowel dilation given lack of prior imaging but small bowel was reportedly dilated. The dilated small bowel loops have a   thickened wall. It is unclear if this is inflammation related to Crohn's or   secondary to dilation; ischemia is possible. No pneumatosis.   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   3.  Mesenteric lymphadenopathy which is likely reactive.   4.  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   5.  Hypovolemia.   6.  Mild anasarca.   7.  Avascular necrosis right femoral head.        Plan:        1. If patient wishes to stay in this area, we are happy to take on long-term Crohn's management. Will  order pre-biologic labs and Humira/Remicade antibodies to be drawn tomorrow AM in hopes of re-starting Humira vs. Remicade as OP. Remicade may be a better  direction given issues with poor compliance.    2. Agree w/ NGT, NPO status, and daily KUB. - Appreciate general surgery recommendations.   3. Minimize medications that slow down GI motility (narcotics, anticholinergics).   4. GI scopes contraindicated in setting of SBO.   5. Pain control per primary team.   6. Continue medical management per primary team.          Andres Ege, PA-C.    01/31/21, 6:01 PM    [facility]   [email or web address]   Phone: [phone]   Pager: [phone]

## 2021-01-31 NOTE — ED Notes (Signed)
Purposeful rounding completed:    Side rails up x 2:  YES  Bed in low position and wheels locked: YES  Call bell within reach: YES  Comfort addressed: YES    Toileting needs addressed: YES  Plan of care reviewed/updated with patient and or family members: YES  IV site assessed: YES  Pain assessed and addressed: YES, 3    Patient laying in bed, eyes closed, laying on right side. No respiratory distress observed.

## 2021-01-31 NOTE — H&P (Signed)
H&P by Lauraine Rinne, PA at 01/31/21 1420                Author: Lauraine Rinne, PA  Service: Hospitalist  Author Type: Physician Assistant       Filed: 01/31/21 1700  Date of Service: 01/31/21 1420  Status: Attested           Editor: Lauraine Rinne, PA (Physician Assistant)  Cosigner: Freddie Apley, DO at 02/10/21 1536          Attestation signed by Freddie Apley, DO at 02/10/21 1536          I have personally seen, evaluated and examined the patient. Agree with documentation of assessment and plan as documented by PA Lyndie Vanderloop and case discussed.         Visit Vitals   BP  105/65      Pulse  88      Temp  98.1 ??F (36.7 ??C)      Resp  20      Ht  '5\' 8"'  (1.727 m)      Wt  58.5 kg (129 lb)      SpO2  97%      BMI  19.61 kg/m??            General: NAD, appears stated age, alert   Skin: warm, dry, no rashes   Eyes:  sclera is non-icteric   HENT: normocephalic/atraumatic, moist mucus membranes   Respiratory: CTA with no signs of respiratory distress   Cardiovascular: RRR, no m/r/g   GI: soft, non-distended. Non-tympanic. Extensive surgical scarring. Hyperactive bowel sounds.    Extremities: no cyanosis, no peripheral edema   Neuro: moves all extremities, no focal deficits, normal speech   Psych: appropriate mood and affect      Patient admitted with small bowel obstruction. NGT placed. No local GI as patient has just moved here. Will consult GI about Humira, mesalamine.       Freddie Apley, DO                                 History and Physical                Subjective        HPI: Jonathan Yoder is a  45 y.o. male with a PMHx of Crohn's s/p ileocectomy (2003), s/p R hemicolectomy, colostomy  (09/02/2019), and now s/p colostomy takedown and reanastomosis (11/01/19) who presented to the ED with c/o diffuse abdominal pain associated with NV and constipation for 2 days. Pain is currently 5/10, improved with morphine. He reports similar sx with  prior Crohn's flares. He denies fever, chill, sob,  cp, BRBPR, melena, dysuria. He states he usually gets Humira injections every 2 weeks, but hasn't had an injection in 4 weeks. He recently moved here and doesn't have a local gastroenterologist. Per the  Doristine Bosworth at the [facility] shelter he is staying at, he's been with them for about 3-4 days.      In the ED, lactic initially 2.13, improved to 1.03 with fluids. CT showed SBO with possible transition point at the anastomosis. General surgery was consulted and NG tube placed.       PMHx:     Past Medical History:        Diagnosis  Date         ?  Crohn disease ([place])  PSurgHx:     Past Surgical History:         Procedure  Laterality  Date          ?  HX COLOSTOMY    09/02/2019     ?  HX COLOSTOMY TAKE DOWN    11/01/2019          ?  HX PARTIAL COLECTOMY  Right             SocialHx:     Social History          Socioeconomic History         ?  Marital status:  SINGLE           FamilyHx:     Family History         Problem  Relation  Age of Onset          ?  No Known Problems  Father             Home Medications:     Prior to Admission Medications     Prescriptions  Last Dose  Informant  Patient Reported?  Taking?      adalimumab (Humira,CF, Pen) 40 mg/0.4 mL injection pen      Yes  Yes      Sig: 40 mg by SubCUTAneous route Once every 2 weeks.      mesalamine (PENTASA) 250 mg CR capsule      Yes  No      Sig: Take 250 mg by mouth four (4) times daily.      pantoprazole (PROTONIX) 40 mg tablet      Yes  Yes      Sig: Take 40 mg by mouth Daily (before breakfast).               Facility-Administered Medications: None           Allergies:   No Known Allergies       Review of Systems:   CONST: no weight loss, no falls, no fever or chills   HEENT: No change in vision, no sore throat or sinus congestion.    NECK: No pain or stiffness.    PULM: No shortness of breath, no cough or wheeze.    CV: no pnd or orthopnea, no CP, no palpitations, no edema   GI: +abdominal pain, +nausea, +vomiting +constipation   GU: No urinary  frequency, no urgency, no hesitancy or dysuria.    MSK: No joint or muscle pain, no back pain, no recent trauma.    INTEG: No rash, no itching, no lesions.   NEURO No headache, no dizziness, no tingling or weakness.    PSYCH: No anxiety, no depression           Objective        Physical Exam:   Visit Vitals      BP  116/80     Pulse  91     Temp  98.4 ??F (36.9 ??C)     Resp  13     Ht  '5\' 8"'  (1.727 m)     Wt  58.5 kg (129 lb)     SpO2  99%        BMI  19.61 kg/m??           General: NAD, appears stated age, alert   Skin: warm, dry, no rashes   Eyes:  sclera is non-icteric   HENT: normocephalic/atraumatic, moist mucus membranes   Respiratory: CTA  with no signs of respiratory distress   Cardiovascular: RRR, no m/r/g   GI: soft, moderate diffuse tenderness, hyperactive bowel sounds, palpable peristalsis   Extremities: no cyanosis, no peripheral edema   Neuro: moves all extremities, no focal deficits, normal speech   Psych: appropriate mood and affect      Laboratory Studies:     Recent Results (from the past 24 hour(s))     CBC WITH AUTOMATED DIFF          Collection Time: 01/31/21  8:45 AM         Result  Value  Ref Range            WBC  9.2  4.6 - 13.2 K/uL       RBC  5.04  4.35 - 5.65 M/uL       HGB  14.0  13.0 - 16.0 g/dL       HCT  43.6  36.0 - 48.0 %       MCV  86.5  78.0 - 100.0 FL       MCH  27.8  24.0 - 34.0 PG       MCHC  32.1  31.0 - 37.0 g/dL       RDW  14.9 (H)  11.6 - 14.5 %       PLATELET  270  135 - 420 K/uL       MPV  9.8  9.2 - 11.8 FL       NRBC  0.0  0 PER 100 WBC       ABSOLUTE NRBC  0.00  0.00 - 0.01 K/uL       NEUTROPHILS  59  40 - 73 %       LYMPHOCYTES  22  21 - 52 %       MONOCYTES  17 (H)  3 - 10 %       EOSINOPHILS  1  0 - 5 %       BASOPHILS  1  0 - 2 %       IMMATURE GRANULOCYTES  1 (H)  0.0 - 0.5 %       ABS. NEUTROPHILS  5.4  1.8 - 8.0 K/UL       ABS. LYMPHOCYTES  2.0  0.9 - 3.6 K/UL       ABS. MONOCYTES  1.6 (H)  0.05 - 1.2 K/UL       ABS. EOSINOPHILS  0.1  0.0 - 0.4 K/UL       ABS.  BASOPHILS  0.1  0.0 - 0.1 K/UL       ABS. IMM. GRANS.  0.1 (H)  0.00 - 0.04 K/UL       DF  AUTOMATED          METABOLIC PANEL, COMPREHENSIVE          Collection Time: 01/31/21  8:45 AM         Result  Value  Ref Range            Sodium  142  136 - 145 mmol/L       Potassium  3.5  3.5 - 5.5 mmol/L       Chloride  107  100 - 111 mmol/L       CO2  28  21 - 32 mmol/L       Anion gap  7  3.0 - 18 mmol/L       Glucose  102 (H)  74 -  99 mg/dL       BUN  11  7.0 - 18 MG/DL       Creatinine  0.99  0.6 - 1.3 MG/DL       BUN/Creatinine ratio  11 (L)  12 - 20         GFR est AA  >60  >60 ml/min/1.48m       GFR est non-AA  >60  >60 ml/min/1.798m      Calcium  8.9  8.5 - 10.1 MG/DL       Bilirubin, total  0.4  0.2 - 1.0 MG/DL       ALT (SGPT)  34  16 - 61 U/L       AST (SGOT)  18  10 - 38 U/L       Alk. phosphatase  106  45 - 117 U/L       Protein, total  6.8  6.4 - 8.2 g/dL       Albumin  2.8 (L)  3.4 - 5.0 g/dL       Globulin  4.0  2.0 - 4.0 g/dL       A-G Ratio  0.7 (L)  0.8 - 1.7         LIPASE          Collection Time: 01/31/21  8:45 AM         Result  Value  Ref Range            Lipase  164  73 - 393 U/L       MAGNESIUM          Collection Time: 01/31/21  8:45 AM         Result  Value  Ref Range            Magnesium  1.8  1.6 - 2.6 mg/dL       POC LACTIC ACID          Collection Time: 01/31/21  8:49 AM         Result  Value  Ref Range            Lactic Acid (POC)  2.13 (HH)  0.40 - 2.00 mmol/L       EKG, 12 LEAD, INITIAL          Collection Time: 01/31/21  8:53 AM         Result  Value  Ref Range            Ventricular Rate  98  BPM       Atrial Rate  98  BPM       P-R Interval  138  ms       QRS Duration  84  ms       Q-T Interval  334  ms       QTC Calculation (Bezet)  426  ms       Calculated P Axis  79  degrees       Calculated R Axis  64  degrees       Calculated T Axis  76  degrees       Diagnosis                 Normal sinus rhythm   Normal ECG   No previous ECGs available          URINALYSIS W/ RFLX MICROSCOPIC           Collection Time: 01/31/21  9:00  AM         Result  Value  Ref Range            Color  YELLOW          Appearance  CLEAR          Specific gravity  1.026  1.005 - 1.030         pH (UA)  6.5  5.0 - 8.0         Protein  TRACE (A)  NEG mg/dL       Glucose  Negative  NEG mg/dL       Ketone  Negative  NEG mg/dL       Bilirubin  Negative  NEG         Blood  Negative  NEG         Urobilinogen  0.2  0.2 - 1.0 EU/dL       Nitrites  Negative  NEG         Leukocyte Esterase  Negative  NEG         URINE MICROSCOPIC ONLY          Collection Time: 01/31/21  9:00 AM         Result  Value  Ref Range            WBC  1 to 4  0 - 4 /hpf       RBC  Negative  0 - 5 /hpf       Epithelial cells  FEW  0 - 5 /lpf       Bacteria  FEW (A)  NEG /hpf       Mucus  2+ (A)  NEG /lpf       CA Oxalate crystals  2+ (A)  NEG       POC LACTIC ACID          Collection Time: 01/31/21  1:04 PM         Result  Value  Ref Range            Lactic Acid (POC)  1.03  0.40 - 2.00 mmol/L           Imaging Reviewed:   CT ABD PELV W CONT      Result Date: 01/31/2021   CT ABDOMEN AND PELVIS WITH ENHANCEMENT INDICATION: History of Crohn's disease with nausea and vomiting since last night, constipation, reversal of ileostomy 4/21. TECHNIQUE: Axial images obtained of the abdomen and pelvis following the uneventful administration  of nonionic intravenous contrast.  Coronal and sagittal reformatted images of the abdomen and pelvis were obtained. All CT scans at this facility are performed using dose optimization technique as appropriate to a performed exam, to include automated  exposure control, adjustment of the mA and/or kV according to patient size (including appropriate matching first site-specific examinations), or use of iterative reconstruction technique. COMPARISON: Outside CT report from 01/22/2021 reports dilated small  bowel loops. No direct comparison imaging available.  ABDOMEN FINDINGS: Liver: Unremarkable. Spleen: Unremarkable. Pancreas:  Unremarkable. Biliary: Cholecystectomy. Bowel: Duodenum does not cross midline into the left abdomen. Unable to follow bowel loops  throughout since several of the bowel loops are decompressed and there is a paucity of mesenteric fat. There is a markedly dilated small bowel loop distally measures up to 6.3 cm (coronal 22). The dilated bowel loop is adjacent to bowel resection. There  is small bowel feces and mild small bowel wall thickening distally. Colon is not decompressed.  There may be a stricture at the small bowel anastomosis. Peritoneum/ Retroperitoneum: No free air. No free or loculated fluid.  There is generalized edema.  Lymph Nodes: There is mesenteric lymphadenopathy including a cluster of the patella and 3.2 x 2.3 cm midline nodes (series 2, image 40). Adrenal Glands: Unremarkable. Kidneys: Unremarkable. Vessels: IVC is flattened. There is contrast in collateral vessels  left body wall and left azygos. PELVIS FINDINGS: Bladder/ Pelvic Organs: Unremarkable. Lung Base: Unremarkable. Bones: Mild anasarca. Avascular necrosis right femoral head. Straightening thoracolumbar spine. Minimal retrolisthesis L4-5.        1.  Small bowel obstruction, with potential transition point/stricture at the small bowel anastomosis distal ileum, though colon is not decompressed. Unable to compare the degree of small bowel dilation given lack of prior imaging but small bowel was  reportedly dilated. The dilated small bowel loops have a thickened wall. It is unclear if this is inflammation related to Crohn's or secondary to dilation; ischemia is possible. No pneumatosis. 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 3.  Mesenteric  lymphadenopathy which is likely reactive. 4.  There is likely a stricture of the upper vasculature given contrast in collateral vessels. 5.  Hypovolemia. 6.  Mild anasarca. 7.  Avascular necrosis right femoral head.      XR CHEST PORT      Result Date: 01/31/2021   EXAM: CHEST  CPT CODE: 50093  CLINICAL INDICATION/HISTORY: Meets SIRS criteria. COMPARISON: None. TECHNIQUE: Single AP portable view of chest at 1016. FINDINGS: There are clear lungs and sharp pleural margins.  The cardiomediastinal silhouette is normal.   The bones and soft tissues are unremarkable.  Mild scoliosis of the thoracic spine convex to the left, possibly positional.       No acute pulmonic disease.                  Assessment/Plan           Hospital Problems   Never Reviewed                         Codes  Class  Noted  POA              SBO (small bowel obstruction) ([place])  ICD-10-CM: G18.299   ICD-9-CM: 560.9    01/31/2021  Unknown                        Elevated lactic acid level  ICD-10-CM: R79.89   ICD-9-CM: 276.2    01/31/2021  Unknown                        Crohn's disease with intestinal obstruction ([place])  ICD-10-CM: B71.696   ICD-9-CM: 555.9, 560.9    01/31/2021  Yes                       SBO:  potential transition point/stricture at the small bowel anastomosis  on CT   - appreciate GS assistance   - NGT to suction   - NPO    - IV fluids. Can finish bag of NS before switching to LR   - PRN pain control with toradol. Reserved narcotics for severe pain.      Crohn's disease: recently moved here and does not have local GI    - GI consulted   - Should be on humira and mesalamine. Has not had humira injection in  4 weeks      Lactic acidosis: due to dehydration. Resolved.             Anticipated Discharge: >2 days      DVT Prophylaxis:  '[]' Lovenox  '[]' Hep SQ  '[x]'  SCDs  '[]' Coumadin '[]' DOAC  '[]'  On Heparin gtt       I have personally reviewed all pertinent labs, films and EKGs that have officially resulted. I reviewed available electronic documentation outlining the initial presentation as well as the emergency room physician's encounter.      Randa Spike, PA-C   [facility]   Hospitalist Division   Office:  [phone]   Pager: [phone]

## 2021-01-31 NOTE — Progress Notes (Signed)
 Progress  Notes by Melbourne Mar, [facility] at 01/31/21 9063                Author: Palagyi, Lori, [facility]  Service: Pharmacist  Author Type: Pharmacist       Filed: 01/31/21 0936  Date of Service: 01/31/21 0936  Status: Signed          Editor: Palagyi, Mar [facility] (Pharmacist)                    Pharmacy Note       Zosyn 3.375 grams IV q6hr ordered for treatment of Intra-abdominal infection. Per [facility] Policy, 3.375 grams IV Q6h will be changed to 4.5 grams x 1 dose followed by 3.375 grams  Q8h each dose over 240 minutes.       Estimated Creatinine Clearance: Estimated Creatinine Clearance: 78 mL/min (based on SCr of 0.99 mg/dL).   Dialysis Status, AKI, CKD: SCr = 0.99, no kidney disease apparent at this time     BMI:  Body mass index is 19.61 kg/m.      Rationale for Adjustment:  [facility] B-Lactam extended infusion policy      Pharmacy will continue to monitor and adjust dose as necessary.        Please call with any questions.      Thank you,   Lori Palagyi, [facility]

## 2021-01-31 NOTE — ED Notes (Signed)
Patient is requesting pain medication. Danielle PA is aware.

## 2021-01-31 NOTE — Progress Notes (Signed)
 TRANSFER - IN REPORT:    Verbal report received from Heidi, RN(name) on Jonathan Yoder  being received from ER(unit) for routine progression of care      Report consisted of patient's Situation, Background, Assessment and   Recommendations(SBAR).     Information from the following report(s) SBAR, Kardex, Intake/Output, MAR and Recent Results was reviewed with the receiving nurse.    Opportunity for questions and clarification was provided.      Assessment completed upon patient's arrival to unit and care assumed.

## 2021-01-31 NOTE — Progress Notes (Signed)
CT Delay     1055am - Per PA Danielle - still wants pt to drink oral contrast - pt in pain and nauseous - Danielle to order meds - will call back if she decideds to change to IV only - lek     1006am - Per RN Heidi pt needs zofran to drink contrast - will call when pt has finished drinking - lek    [phone] - needs oral contrast - gfr > 60 - RN to call CT when pt has finished drinking - lek    [phone] - 20g lt wrist - labs in process - lek    0815--Labs & IV pending

## 2021-02-01 ENCOUNTER — Inpatient Hospital Stay: Admit: 2021-02-01 | Payer: MEDICAID | Attending: Family Medicine | Primary: Family Medicine

## 2021-02-01 ENCOUNTER — Inpatient Hospital Stay: Admit: 2021-02-01 | Payer: MEDICAID | Primary: Family Medicine

## 2021-02-01 LAB — EKG 12-LEAD
Atrial Rate: 98 {beats}/min
Diagnosis: NORMAL
P Axis: 79 degrees
P-R Interval: 138 ms
Q-T Interval: 334 ms
QRS Duration: 84 ms
QTc Calculation (Bazett): 426 ms
R Axis: 64 degrees
T Axis: 76 degrees
Ventricular Rate: 98 {beats}/min

## 2021-02-01 LAB — DRUG SCREEN, URINE
AMPHETAMINES: NEGATIVE
Amphetamine Screen, Urine: NEGATIVE
BARBITURATES: NEGATIVE
BENZODIAZEPINES: NEGATIVE
Barbiturate Screen, Urine: NEGATIVE
Benzodiazepine Screen, Urine: NEGATIVE
COCAINE: NEGATIVE
Cocaine Screen Urine: NEGATIVE
METHADONE: NEGATIVE
Methadone Screen, Urine: NEGATIVE
OPIATES: POSITIVE — AB
Opiate Screen, Urine: POSITIVE — AB
PCP Screen, Urine: NEGATIVE
PCP(PHENCYCLIDINE): NEGATIVE
THC (TH-CANNABINOL): POSITIVE — AB
THC Screen, Urine: POSITIVE — AB

## 2021-02-01 LAB — HEPATITIS B SURFACE ANTIBODY
Hep B S Ab Interp: NEGATIVE — AB
Hep B S Ab: 3.64 m[IU]/mL — ABNORMAL LOW (ref >10.0)

## 2021-02-01 LAB — BASIC METABOLIC PANEL
Anion Gap: 6 mmol/L (ref 3.0–18)
BUN: 11 MG/DL (ref 7.0–18)
Bun/Cre Ratio: 12 (ref 12–20)
CO2: 24 mmol/L (ref 21–32)
Calcium: 7.5 MG/DL — ABNORMAL LOW (ref 8.5–10.1)
Chloride: 107 mmol/L (ref 100–111)
Creatinine: 0.89 MG/DL (ref 0.6–1.3)
EGFR IF NonAfrican American: >60 mL/min/{1.73_m2} (ref >60)
GFR African American: >60 mL/min/{1.73_m2} (ref >60)
Glucose: 81 mg/dL (ref 74–99)
Potassium: 3.3 mmol/L — ABNORMAL LOW (ref 3.5–5.5)
Sodium: 137 mmol/L (ref 136–145)

## 2021-02-01 LAB — CBC W/O DIFF
ABSOLUTE NRBC: 0 10*3/uL (ref 0.00–0.01)
HCT: 34.7 % — ABNORMAL LOW (ref 36.0–48.0)
HGB: 11.4 g/dL — ABNORMAL LOW (ref 13.0–16.0)
MCH: 27.8 pg (ref 24.0–34.0)
MCHC: 32.9 g/dL (ref 31.0–37.0)
MCV: 84.6 fL (ref 78.0–100.0)
MPV: 9.8 fL (ref 9.2–11.8)
NRBC: 0 /100{WBCs}
PLATELET: 237 10*3/uL (ref 135–420)
RBC: 4.1 M/uL — ABNORMAL LOW (ref 4.35–5.65)
RDW: 14.9 % — ABNORMAL HIGH (ref 11.6–14.5)
WBC: 8.3 10*3/uL (ref 4.6–13.2)

## 2021-02-01 LAB — EKG, 12 LEAD, INITIAL
Atrial Rate: 98 {beats}/min
Calculated P Axis: 79 degrees
Calculated R Axis: 64 degrees
Calculated T Axis: 76 degrees
Diagnosis: NORMAL
P-R Interval: 138 ms
Q-T Interval: 334 ms
QRS Duration: 84 ms
QTC Calculation (Bezet): 426 ms
Ventricular Rate: 98 {beats}/min

## 2021-02-01 LAB — HEP B SURFACE AG
Hep B surface Ag Interp.: NEGATIVE
Hepatitis B surface Ag: <0.1 Index (ref <1.00)

## 2021-02-01 LAB — HEPATITIS C AB
Hep C virus Ab Interp.: NEGATIVE
Hepatitis C virus Ab: 0.15 Index (ref <0.80)

## 2021-02-01 LAB — METABOLIC PANEL, BASIC
Anion gap: 6 mmol/L (ref 3.0–18)
BUN/Creatinine ratio: 12 (ref 12–20)
BUN: 11 mg/dL (ref 7.0–18)
CO2: 24 mmol/L (ref 21–32)
Calcium: 7.5 MG/DL — ABNORMAL LOW (ref 8.5–10.1)
Chloride: 107 mmol/L (ref 100–111)
Creatinine: 0.89 mg/dL (ref 0.6–1.3)
GFR est AA: >60 mL/min/{1.73_m2} (ref >60)
GFR est non-AA: >60 mL/min/{1.73_m2} (ref >60)
Glucose: 81 mg/dL (ref 74–99)
Potassium: 3.3 mmol/L — ABNORMAL LOW (ref 3.5–5.5)
Sodium: 137 mmol/L (ref 136–145)

## 2021-02-01 LAB — HEP B SURFACE AB
Hep B surface Ab Interp.: NEGATIVE — AB
Hepatitis B surface Ab: 3.64 m[IU]/mL — ABNORMAL LOW (ref >10.0)

## 2021-02-01 LAB — HEPATITIS B SURFACE ANTIGEN: Hepatitis B Surface Ag: <0.1 Index (ref <1.00)

## 2021-02-01 LAB — CBC
Hematocrit: 34.7 % — ABNORMAL LOW (ref 36.0–48.0)
Hemoglobin: 11.4 g/dL — ABNORMAL LOW (ref 13.0–16.0)
MCH: 27.8 PG (ref 24.0–34.0)
MCHC: 32.9 g/dL (ref 31.0–37.0)
MCV: 84.6 FL (ref 78.0–100.0)
MPV: 9.8 FL (ref 9.2–11.8)
NRBC Absolute: 0 10*3/uL (ref 0.00–0.01)
Nucleated RBCs: 0 PER 100 WBC
Platelets: 237 10*3/uL (ref 135–420)
RBC: 4.1 M/uL — ABNORMAL LOW (ref 4.35–5.65)
RDW: 14.9 % — ABNORMAL HIGH (ref 11.6–14.5)
WBC: 8.3 10*3/uL (ref 4.6–13.2)

## 2021-02-01 LAB — HEPATITIS C ANTIBODY
HCV Ab: 0.15 Index (ref <0.80)
Hepatitis C Ab: NEGATIVE

## 2021-02-01 MED ORDER — SODIUM CHLORIDE 0.9 % INJECTION
40 mg | INTRAMUSCULAR | Status: DC
Start: 2021-02-01 — End: 2021-02-04
  Administered 2021-02-01 – 2021-02-04 (×3): via INTRAVENOUS

## 2021-02-01 MED FILL — ONDANSETRON 4 MG TAB, RAPID DISSOLVE: 4 mg | ORAL | Qty: 1

## 2021-02-01 MED FILL — PROTONIX 40 MG INTRAVENOUS SOLUTION: 40 mg | INTRAVENOUS | Qty: 40

## 2021-02-01 MED FILL — DIPHENHYDRAMINE HCL 50 MG/ML IJ SOLN: 50 mg/mL | INTRAMUSCULAR | Qty: 1

## 2021-02-01 MED FILL — KETOROLAC TROMETHAMINE 15 MG/ML INJECTION: 15 mg/mL | INTRAMUSCULAR | Qty: 1

## 2021-02-01 MED FILL — ONDANSETRON (PF) 4 MG/2 ML INJECTION: 4 mg/2 mL | INTRAMUSCULAR | Qty: 2

## 2021-02-01 NOTE — Consults (Signed)
Consults by  Andres Ege, PA-C at 02/01/21 0915                Author: Andres Ege, PA-C  Service: Gastroenterology  Author Type: Physician Assistant       Filed: 02/01/21 0925  Date of Service: 02/01/21 0915  Status: Attested           Editor: Curtis Sites (Physician Assistant)  Cosigner: Philemon Kingdom, MD at 02/01/21 [phone]            Consult Orders        1. IP CONSULT TO GASTROENTEROLOGY [[id number]] ordered by Roel Cluck, PA at 01/31/21 1615                         Attestation signed by Philemon Kingdom, MD at 02/01/21 [phone]          GI Attending note:   -Pt seen, examined and evaluated independently. Findings as outlined by NP confirmed. Agree with assessment and plan.         Impression:   Crohn's w strictures, SBO         Plan:   Cont supportive care and if no improvement then surgical management. Will hold off on resuming biologics until we see him as an OP. Want to establish compliance before committing to any biologics  or treatment plan. I discussed w him this AM.                                        [email or web address]   [phone]      GASTROENTEROLOGY CONSULT           Impression:     Jonathan Yoder is a 45 y.o. male w/ PMH Crohn's disease w/ extensive surgeries/hx as noted below, COPD, DVT (2011), bipolar, cannabis abuse, and drug seeking  behavior who I am being asked to see in consultation for an opinion regarding SBO in the setting of Crohn's disease. Patient presented to the ED 6/30 with diffuse 5/10 abd pain w/ associated n/v and obstipation. Sx consistent w/ prior Crohn's flares.  Denies f/c, black/bloody stools, or weight loss. ED w/u significant for elevated lactic acid, CT revealing SBO w/ potential transition point/stricture at SB anastomosis distal ileum. NG tube placed and general surgery consulted for further management.    ??   Reports he was reviously on daily mesalamine and Humira w/ injections every 2 weeks but ran out of medication 4wks ago; contradictory  chart review as noted below. Appears to have recently moved to the area within the last 7d. There are concerns  of possible homelessness and significant poor compliance.       7/1: patient doing well today, passing gas but no BM yet. Denies f/c/n/v. Willing to f/u as OP w/ [facility] and requests to stay on Humira, feels he has been compliant over the last year no this therapy but recently ran out of medication. Previously failed  Remicade as he is an extremely hard stick.    ??   IBD summary of care per chart review:   ??  Surgical hx: h/o SBO, s/p ileocectomy (2003), s/p R hemicolectomy and  EI (09/02/2019), and now s/p colostomy takedown and reanastomosis (11/01/19)   ??  Med hx: Most recently on daily mesalamine and Humira w/ injections every 2 weeks but ran out of medication 4wks ago. On  Stelara in 2021,  Remicade prior but had to d/c secondary to poor IV access. Patient with significant history of poor compliance with several ED and hospital admissions in past. Multiple missed GI clinic visits.    ??  01/22/21 Admission: [facility] [place] - admitted 6/21 for abd pain w/ n/v and diarrhea x4-5d. GI consulted and started on prednisone. D/c w/ OP f/u.   ??  01/22/21 OP [place] GI visit - Refill of Humira and pantoprazole sent. Was prev seen as OP 12/2019 and started on Stelara but patient was lost to  f/u    ??  12/2019 OP General Surgery note w/ mention of most recent hospital admission 11/2019 complicated by urosepsis/line sepsis requiring ICU care,  d/c 4/9.    ??  03/2015 colonoscopy performed during hospital admission significant for non-patent surgical anastomosis characterized by congestion with friable  mucosa, ulceration and severe stenosis with normal mucosa in the rectum/colon. Patient started on IV solumedrol and GI surgery was consulted. Patient chose to defer ileostomy, subsequently given dose of remicade and d/c w/ mercaptopurine and prednisone  taper.   ??  2014 - documentation of  regular remicade infusions    ??   ??   6/30 CT Abd Pelvis w/ IV Contrast IMPRESSION:   1. ??Small bowel obstruction, with potential transition point/stricture at the   small bowel anastomosis distal ileum, though colon is not decompressed. Unable to compare the degree of small bowel dilation given lack of prior imaging but small bowel was reportedly dilated. The dilated small bowel loops have a   thickened wall. It is unclear if this is inflammation related to Crohn's or   secondary to dilation; ischemia is possible. No pneumatosis.   2. 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   3. ??Mesenteric lymphadenopathy which is likely reactive.   4. ??There is likely a stricture of the upper vasculature given contrast in   collateral vessels.   5. ??Hypovolemia.   6. ??Mild anasarca.   7. ??Avascular necrosis right femoral head.        Plan:        1. Patient requests [facility] take over OP GI care. If patient is committed to Humira we are happy to start this as an OP. Pre-biologic w/u and humira antibodies pending. [facility] contacted  to schedule OP f/u.     2. Agree w/ NGT, NPO status, daily KUB, and UGIS prior to d/c.   3. Minimize medications that slow down GI motility (narcotics, anticholinergics).   4. GI scopes contraindicated in setting of SBO.   5. Pain control per primary team.   6. Continue medical management per primary team.    7. GI to sign off. Thank you for this consultation and the opportunity to participate in the care of this patient.  Please do not hesitate to call with any questions or concerns, or should event occur that may necessitate additional GI evaluation.      Chief Complaint: SBO, Crohn's disease         PMH:      Past Medical History:        Diagnosis  Date         ?  Crohn disease (HCC)             PSH:      Past Surgical History:         Procedure  Laterality  Date          ?  HX COLOSTOMY    09/02/2019     ?  HX COLOSTOMY TAKE DOWN    11/01/2019          ?  HX PARTIAL COLECTOMY  Right             Social  HX:      Social History          Socioeconomic History         ?  Marital status:  SINGLE              Spouse name:  Not on file         ?  Number of children:  Not on file     ?  Years of education:  Not on file     ?  Highest education level:  Not on file       Occupational History        ?  Not on file       Tobacco Use         ?  Smoking status:  Not on file     ?  Smokeless tobacco:  Not on file       Substance and Sexual Activity         ?  Alcohol use:  Not on file     ?  Drug use:  Not on file     ?  Sexual activity:  Not on file        Other Topics  Concern         ?  Military Service  Not Asked     ?  Blood Transfusions  Not Asked     ?  Caffeine Concern  Not Asked     ?  Occupational Exposure  Not Asked     ?  Hobby Hazards  Not Asked     ?  Sleep Concern  Not Asked     ?  Stress Concern  Not Asked     ?  Weight Concern  Not Asked     ?  Special Diet  Not Asked     ?  Back Care  Not Asked     ?  Exercise  Not Asked     ?  Bike Helmet  Not Asked     ?  Seat Belt  Not Asked     ?  Self-Exams  Not Asked       Social History Narrative        ?  Not on file          Social Determinants of Health          Financial Resource Strain:         ?  Difficulty of Paying Living Expenses: Not on file       Food Insecurity:         ?  Worried About Running Out of Food in the Last Year: Not on file     ?  Ran Out of Food in the Last Year: Not on file       Transportation Needs:         ?  Lack of Transportation (Medical): Not on file     ?  Lack of Transportation (Non-Medical): Not on file       Physical Activity:         ?  Days of Exercise per Week: Not on file     ?  Minutes of Exercise per Session: Not on file       Stress:         ?  Feeling of Stress : Not on file       Social Connections:         ?  Frequency of Communication with Friends and Family: Not on file     ?  Frequency of Social Gatherings with Friends and Family: Not on file     ?  Attends Religious Services: Not on file     ?  Active Member of Clubs or  Organizations: Not on file     ?  Attends Banker Meetings: Not on file     ?  Marital Status: Not on file       Intimate Partner Violence:         ?  Fear of Current or Ex-Partner: Not on file     ?  Emotionally Abused: Not on file     ?  Physically Abused: Not on file     ?  Sexually Abused: Not on file       Housing Stability:         ?  Unable to Pay for Housing in the Last Year: Not on file     ?  Number of Places Lived in the Last Year: Not on file        ?  Unstable Housing in the Last Year: Not on file           FHX:      Family History         Problem  Relation  Age of Onset          ?  No Known Problems  Father             Allergy:    No Known Allergies        Patient Active Problem List        Diagnosis  Code         ?  SBO (small bowel obstruction) (HCC)  K56.609     ?  Elevated lactic acid level  R79.89         ?  Crohn's disease with intestinal obstruction (HCC)  K50.912             Home Medications:          Medications Prior to Admission        Medication  Sig         ?  adalimumab (Humira,CF, Pen) 40 mg/0.4 mL injection pen  40 mg by SubCUTAneous route Once every 2 weeks.     ?  mesalamine (PENTASA) 250 mg CR capsule  Take 250 mg by mouth four (4) times daily.         ?  pantoprazole (PROTONIX) 40 mg tablet  Take 40 mg by mouth Daily (before breakfast).             Review of Systems:           Constitutional:  No fevers, chills, weight loss, fatigue.        Skin:  No rashes, pruritis, jaundice, ulcerations, erythema.     HENT:  No headaches, nosebleeds, sinus pressure, rhinorrhea, sore throat.     Eyes:  No visual changes, blurred vision, eye pain, photophobia, jaundice.     Cardiovascular:  No chest pain, heart palpitations.        Respiratory:  No  cough, SOB, wheezing, chest discomfort, orthopnea.     Gastrointestinal:  See HPI         Genitourinary:  No dysuria, bleeding, discharge, pyuria.        Musculoskeletal:  No weakness, arthralgias, wasting.     Endo:  No sweats.     Heme:   No bruising, easy bleeding.        Allergies:  As noted.        Neurological:  Cranial nerves intact.  Alert and oriented. Gait not assessed.        Psychiatric:   No anxiety, depression, hallucinations.               Visit Vitals      BP  119/75 (BP 1 Location: Right upper arm, BP Patient Position: Semi fowlers;Lying)     Pulse  (!) 14     Temp  (!) 100.7 ??F (38.2 ??C)     Resp  16     Ht  5\' 8"  (1.727 m)     Wt  58.5 kg (129 lb)     SpO2  97%        BMI  19.61 kg/m??             Physical Assessment:        constitutional: well developed, well nourished, normal habitus, in no acute distress.  NGT in place  skin: no rashes, ulcers, icterus or other lesions   eyes: normal conjunctivae and lids; no jaundice pupils: normal   HEENT: normocephalic, atraumatic  neck:  supple, normal ROM   respiratory: normal chest excursion; no intercostal retraction or accessory muscle use; clear to ascultation bilaterally     cardiovascular: regular rate and rhythm, no murmur, rub or gallop.   abdominal:  multiple surgical scars, medium sized R sided ventral hernia w/o overlying skin changes/warmth, non-distended, hypoactive  bowel sounds, soft, non-tender, non-acute. liver/spleen:  not palpable.   extremities: no significant deformity or contracture, no edema. Gait not assessed    neurologic: cranial nerves: grossly normal.   psychiatric: judgement/insight: within normal limits. memory:  within normal limits for recent and remote events. mood and affect: no evidence of depression, anxiety or agitation.  orientation: oriented to time, space and person.            Basic Metabolic Profile     Recent Labs         02/01/21   0534  01/31/21   0845  01/31/21   0845      NA  137    < >  142      K  3.3*    < >  3.5      CL  107    < >  107      CO2  24    < >  28      BUN  11    < >  11      GLU  81    < >  102*      CA  7.5*    < >  8.9      MG   --    --   1.8       < > = values in this interval not displayed.                    CBC w/Diff        Recent Labs  02/01/21   0534      WBC  8.3      RBC  4.10*      HGB  11.4*      HCT  34.7*      MCV  84.6      MCH  27.8      MCHC  32.9      RDW  14.9*      PLT  237           Recent Labs         01/31/21   0845      GRANS  59      LYMPH  22      EOS  1                   Hepatic Function     Recent Labs         01/31/21   0845      ALB  2.8*      TP  6.8      TBILI  0.4      AP  106      LPSE  164                  Coags       No results for input(s): PTP, INR, APTT, INREXT in the last 72 hours.            Andres Ege, PA-C.    02/01/21, 9:25 AM    [facility]   [email or web address]   Phone: [phone]   Pager: [phone]

## 2021-02-01 NOTE — Progress Notes (Signed)
Progress Note  Hospitalist Service    Patient: Jonathan Yoder MRN: [id number]   SSN: [id number]  Date of Birth: [birth year]   Age: 45 y.o.  Sex: male      Admit Date: 01/31/2021    LOS: 1 day   Chief Complaint   Patient presents with   ??? Abdominal Pain       Subjective:     Admitted yesterday secondary to SBO.  NG came out earlier this morning, thankfully - able to be replaced by [facility].   Patient denies abdominal pain, n/v.     Objective:     Vitals:  Visit Vitals  BP 107/65 (BP 1 Location: Right upper arm, BP Patient Position: Semi fowlers;Lying)   Pulse 98   Temp 98.4 ??F (36.9 ??C)   Resp 16   Ht 5\' 8"  (1.727 m)   Wt 58.5 kg (129 lb)   SpO2 97%   BMI 19.61 kg/m??       Physical Exam:   General appearance: alert, cooperative, no distress, appears stated age  Lungs: clear to auscultation bilaterally  Heart: regular rate and rhythm, S1, S2 normal, no murmur, click, rub or gallop  Abdomen: soft, non-tender. Bowel sounds normal. No masses,  no organomegaly  Pulses: 2+ and symmetric  Skin: Skin color, texture, turgor normal. No rashes or lesions  Neuro:  normal without focal findings  mental status, speech normal, alert and oriented x iii  PERLA  reflexes normal and symmetric    Intake and Output:  Current Shift: No intake/output data recorded.  Last three shifts: 06/29 1901 - 07/01 0700  In: -   Out: 1750 [Urine:1750]    Lab/Data Review:  Recent Results (from the past 12 hour(s))   METABOLIC PANEL, BASIC    Collection Time: 02/01/21  5:34 AM   Result Value Ref Range    Sodium 137 136 - 145 mmol/L    Potassium 3.3 (L) 3.5 - 5.5 mmol/L    Chloride 107 100 - 111 mmol/L    CO2 24 21 - 32 mmol/L    Anion gap 6 3.0 - 18 mmol/L    Glucose 81 74 - 99 mg/dL    BUN 11 7.0 - 18 MG/DL    Creatinine 04/04/21 0.6 - 1.3 MG/DL    BUN/Creatinine ratio 12 12 - 20      GFR est AA >60 >60 ml/min/1.93m2    GFR est non-AA >60 >60 ml/min/1.14m2    Calcium 7.5 (L) 8.5 - 10.1 MG/DL   CBC W/O DIFF    Collection Time: 02/01/21  5:34 AM   Result Value Ref  Range    WBC 8.3 4.6 - 13.2 K/uL    RBC 4.10 (L) 4.35 - 5.65 M/uL    HGB 11.4 (L) 13.0 - 16.0 g/dL    HCT 04/04/21 (L) 43.3 - 48.0 %    MCV 84.6 78.0 - 100.0 FL    MCH 27.8 24.0 - 34.0 PG    MCHC 32.9 31.0 - 37.0 g/dL    RDW 29.5 (H) 18.8 - 14.5 %    PLATELET 237 135 - 420 K/uL    MPV 9.8 9.2 - 11.8 FL    NRBC 0.0 0 PER 100 WBC    ABSOLUTE NRBC 0.00 0.00 - 0.01 K/uL   HEP B SURFACE AG    Collection Time: 02/01/21  5:34 AM   Result Value Ref Range    Hepatitis B surface Ag <0.10 <1.00 Index    Hep B surface  Ag Interp. Negative NEG     HEPATITIS C AB    Collection Time: 02/01/21  5:34 AM   Result Value Ref Range    Hepatitis C virus Ab 0.15 <0.80 Index    Hep C virus Ab Interp. Negative NEG      Hep C  virus Ab comment         HEP B SURFACE AB    Collection Time: 02/01/21  5:34 AM   Result Value Ref Range    Hepatitis B surface Ab 3.64 (L) >10.0 mIU/mL    Hep B surface Ab Interp. Negative (A) POS      Hep B surface Ab comment        Samples with a  value of 10 mIU/mL or greater are considered positive (protective immunity) in accordance with the CDC guidelines.         Key Findings or tests:       Telemetry NONE   Oxygen NONE     Assessment and Plan:     ??  SBO: ??potential transition point/stricture at the small bowel anastomosis  on CT  - appreciate GS assistance  - NGT to suction  - NPO   - IV fluids.   - PRN pain control with toradol. Reserved narcotics for severe pain.  ??  Crohn's disease  - GI consulted. Signed off. Will see patient as an OP.   - Should be on humira and mesalamine. Has not had humira injection in 4 weeks  ??  Lactic acidosis: due to dehydration. Resolved.      Diet NPO    DVT Prophylax SCDs   GI Prophylaxis    Code status Full    Disposition Home when SBO resolves         Henrietta Hoover, DO, hospitalist   February 01, 2021

## 2021-02-01 NOTE — Progress Notes (Signed)
Wound Prevention Checklist    Patient: Jonathan Yoder (45 y.o. male)  Date: 02/01/2021  Diagnosis: SBO (small bowel obstruction) (HCC) [K56.609]  Elevated lactic acid level [R79.89] Crohn's disease with intestinal obstruction (HCC)    Precautions:         []   Heel prevention boots placed on patient    []   Patient turned q2h during shift    []   Lift team ordered    []   Patient on [employer] bed/Specialty bed    []   Each Wound is documented during shift (Stage, Color, drainage, odor, measurements, and dressings)    [x]   Dual skin checks done at bedside during shift report with , RN  

## 2021-02-01 NOTE — Progress Notes (Signed)
Chaplain conducted an initial consultation and Spiritual Assessment for Jonathan Yoder, who is a 45 y.o.,male. Patient's Primary Language is: [place].   According to the patient's EMR Religious Affiliation is: [place].     The reason the Patient came to the hospital is:   Patient Active Problem List    Diagnosis Date Noted   . SBO (small bowel obstruction) (HCC) 01/31/2021   . Elevated lactic acid level 01/31/2021   . Crohn's disease with intestinal obstruction (HCC) 01/31/2021        The Chaplain provided the following Interventions:  Initiated a relationship of care and support with patient in room 469 today and found him setting on the side of the bed with his back to the door.  Patient seemed to be very tired and did not speak much above a whisper. There was no advance directive present.  education  Provided information about Spiritual Care Services.  Offered prayer and assurance of continued prayers on patients behalf.       The following outcomes were achieved:  Patient shared limited information about his medical narrative.    Assessment:  Patient does not have any religious/cultural needs that will affect patient's preferences in health care.  There are no further spiritual or religious issues which require Spiritual Care Services interventions at this time.       Plan:  Chaplains will continue to follow and will provide pastoral care on an as needed/requested basis    .Dewaine Oats   Spiritual Care   [phone]

## 2021-02-01 NOTE — Progress Notes (Signed)
NG tube found on the floor. Patient stated it fell out. Patient's IV infiltrated. This nurse attempted twice to start IV. Notified Dr. Margo Aye of patient's IV and NG Tube. Dr. Margo Aye gave verbal orders to replace NG tube and ultrasound guided IV if secondary nurse unsuccessful with IV.

## 2021-02-01 NOTE — Progress Notes (Signed)
Reason for Admission:  SBO (small bowel obstruction) (HCC) [K56.609]  Elevated lactic acid level [R79.89]                 RUR Score:   6%           Plan for utilizing home health:   TBD                    Likelihood of Readmission:   LOW                         Transition of Care Plan:              Initial assessment completed with patient. Cognitive status of patient: oriented to time, place, person and situation.     Face sheet information confirmed:  yes.  The patient designates [name],[phone]) to participate in his discharge plan and to receive any needed information. This patient lives in a single family home with roommates.  Patient is able to navigate steps as needed.  Prior to hospitalization, patient was considered to be independent with ADLs/IADLS : yes .    Patient has a current ACP document on file: no      The patient's friend will be available to transport patient home upon discharge.      The patient reported no medical equipment available in the home.     Patient is not currently active with home health.  Patient has not stayed in a skilled nursing facility or rehab.        This patient is on dialysis :no    Freedom of choice signed: no.     Currently, the discharge plan is Home.    Cordelia Pen with [facility] is working with patient to transition to [place] [place].     CM will continue to monitor and assist with transition of care needs.     The patient states that he can obtain his medications from the pharmacy, and take his medications as directed.    Patient's current insurance is Medicaid of [place]       Care Management Interventions  PCP Verified by CM: Yes (Dr. Emelda Fear is pcp in [facility].)  Mode of Transport at Discharge: Self  Transition of Care Consult (CM Consult): Discharge Planning  Discharge Durable Medical Equipment: No  Physical Therapy Consult: No  Occupational Therapy Consult: No  Speech Therapy Consult: No  Support Systems: Friend/Neighbor  Confirm Follow Up  Transport: Self  Discharge Location  Patient Expects to be Discharged to:: Home      K. Stevie Kern, BSN, Landscape architect # [phone]  Care Manager

## 2021-02-01 NOTE — Progress Notes (Signed)
Problem: Falls - Risk of  Goal: *Absence of Falls  Description: Document Jonathan Yoder Fall Risk and appropriate interventions in the flowsheet.  Outcome: Progressing Towards Goal  Note: Fall Risk Interventions:  Mobility Interventions: Bed/chair exit alarm,Utilize walker, cane, or other assistive device,Patient to call before getting OOB         Medication Interventions: Teach patient to arise slowly,Patient to call before getting OOB                   Problem: Patient Education: Go to Patient Education Activity  Goal: Patient/Family Education  Outcome: Progressing Towards Goal

## 2021-02-01 NOTE — Consults (Signed)
History & Physical    Date: 02/01/2021    Referring Physician: ER    Assessment:      Principal Problem:    Crohn's disease with intestinal obstruction ([place]) (01/31/2021)    Active Problems:    SBO (small bowel obstruction) ([place]) (01/31/2021)      Elevated lactic acid level (01/31/2021)        Plan:   I have discussed the above findings with Mr. Ruel Dimmick with presentation consistent with small bowel obstruction secondary to Crohns. He is feeling better with NGT and this morning has no abdominal pain with a very soft abdomen. Recommend NPO, NGT to LIS with monitoring of output. Awaiting bowel function. Patient should be on medication for his Crohns flare- GI consult is pending. No acute surgical needs at this time continue with conservative management. Possible UGI series pending clinical course.    History of Present Illness:  Mr. Merlo is a 45 y.o. year old male with a PMHx of Crohn's disease, small bowel obstructions, s/p ileocectomy (2003), s/p R hemicolectomy, colostomy (09/02/2019), and now s/p colostomy takedown and reanastomosis (11/01/19) who presented to the ED with c/o diffuse abdominal pain, nausea, vomiting and constipation for 2 days. He is on humara. He states this is his typical presentation and pain when there is a crohns flare. Denies any fevers or chills. He has moved from [place] recently and has not yet established a new GI or surgical doctor to follow. Currently his pain is much improved since ER with placement of NGT. He is not passing flatus and not had a bowel movement yet.     History:  Past Medical History:   Diagnosis Date   ??? Crohn disease ([place])      Past Surgical History:   Procedure Laterality Date   ??? HX COLOSTOMY  09/02/2019   ??? HX COLOSTOMY TAKE DOWN  11/01/2019   ??? HX PARTIAL COLECTOMY Right      Family History   Problem Relation Age of Onset   ??? No Known Problems Father      Social History     Socioeconomic History   ??? Marital status: SINGLE     Spouse name: Not on file   ???  Number of children: Not on file   ??? Years of education: Not on file   ??? Highest education level: Not on file   Occupational History   ??? Not on file   Tobacco Use   ??? Smoking status: Not on file   ??? Smokeless tobacco: Not on file   Substance and Sexual Activity   ??? Alcohol use: Not on file   ??? Drug use: Not on file   ??? Sexual activity: Not on file   Other Topics Concern   ??? Military Service Not Asked   ??? Blood Transfusions Not Asked   ??? Caffeine Concern Not Asked   ??? Occupational Exposure Not Asked   ??? Hobby Hazards Not Asked   ??? Sleep Concern Not Asked   ??? Stress Concern Not Asked   ??? Weight Concern Not Asked   ??? Special Diet Not Asked   ??? Back Care Not Asked   ??? Exercise Not Asked   ??? Bike Helmet Not Asked   ??? Seat Belt Not Asked   ??? Self-Exams Not Asked   Social History Narrative   ??? Not on file     Social Determinants of Health     Financial Resource Strain:    ??? Difficulty  of Paying Living Expenses: Not on file   Food Insecurity:    ??? Worried About [place] in the Last Year: Not on file   ??? Ran Out of Food in the Last Year: Not on file   Transportation Needs:    ??? Lack of Transportation (Medical): Not on file   ??? Lack of Transportation (Non-Medical): Not on file   Physical Activity:    ??? Days of Exercise per Week: Not on file   ??? Minutes of Exercise per Session: Not on file   Stress:    ??? Feeling of Stress : Not on file   Social Connections:    ??? Frequency of Communication with Friends and Family: Not on file   ??? Frequency of Social Gatherings with Friends and Family: Not on file   ??? Attends Religious Services: Not on file   ??? Active Member of Clubs or Organizations: Not on file   ??? Attends Archivist Meetings: Not on file   ??? Marital Status: Not on file   Intimate Partner Violence:    ??? Fear of Current or Ex-Partner: Not on file   ??? Emotionally Abused: Not on file   ??? Physically Abused: Not on file   ??? Sexually Abused: Not on file   Housing Stability:    ??? Unable to Pay for Housing in the  Last Year: Not on file   ??? Number of Places Lived in the Last Year: Not on file   ??? Unstable Housing in the Last Year: Not on file     No Known Allergies    Medications:  No current facility-administered medications on file prior to encounter.     Current Outpatient Medications on File Prior to Encounter   Medication Sig Dispense Refill   ??? adalimumab (Humira,CF, Pen) 40 mg/0.4 mL injection pen 40 mg by SubCUTAneous route Once every 2 weeks.     ??? mesalamine (PENTASA) 250 mg CR capsule Take 250 mg by mouth four (4) times daily.     ??? pantoprazole (PROTONIX) 40 mg tablet Take 40 mg by mouth Daily (before breakfast).         Review of Systems:  Review of Systems   Constitutional: Positive for appetite change and fatigue. Negative for fever and unexpected weight change.   Respiratory: Negative for chest tightness and shortness of breath.    Cardiovascular: Negative for chest pain and palpitations.   Gastrointestinal: Positive for abdominal pain, nausea and vomiting.   Genitourinary: Negative for difficulty urinating and dysuria.   Musculoskeletal: Negative for arthralgias.   Hematological: Negative for adenopathy.       Physical Exam:  Patient Vitals for the past 24 hrs:   BP Temp Pulse Resp SpO2 Height Weight   02/01/21 0534 99/62 99.3 ??F (37.4 ??C) (!) 107 16 97 % -- --   02/01/21 0100 103/61 100.2 ??F (37.9 ??C) (!) 104 16 99 % -- --   01/31/21 1930 114/70 99.8 ??F (37.7 ??C) (!) 106 16 98 % -- --   01/31/21 1847 120/82 99 ??F (37.2 ??C) (!) 121 17 95 % -- --   01/31/21 1757 112/81 -- (!) 104 17 99 % -- --   01/31/21 1700 104/74 98.4 ??F (36.9 ??C) 88 15 98 % -- --   01/31/21 1515 118/81 -- 91 13 97 % -- --   01/31/21 1415 116/80 -- 91 13 99 % -- --   01/31/21 1400 110/78 -- -- -- 99 % -- --  01/31/21 1245 101/65 -- 84 13 98 % -- --   01/31/21 1230 104/74 98.4 ??F (36.9 ??C) 83 13 99 % -- --   01/31/21 1215 109/67 -- 81 12 98 % -- --   01/31/21 1100 106/75 -- 84 16 100 % -- --   01/31/21 1016 -- -- -- -- 99 % -- --   01/31/21  0900 110/74 98.8 ??F (37.1 ??C) 81 18 99 % -- --   01/31/21 0811 107/84 -- 62 24 98 % '5\' 8"'  (1.727 m) 58.5 kg (129 lb)       Physical Exam  Constitutional:       Appearance: Normal appearance. He is normal weight.   HENT:      Head: Normocephalic and atraumatic.   Eyes:      Extraocular Movements: Extraocular movements intact.      Conjunctiva/sclera: Conjunctivae normal.      Pupils: Pupils are equal, round, and reactive to light.   Abdominal:      General: Abdomen is flat. Bowel sounds are normal. There is no distension.      Palpations: Abdomen is soft.      Tenderness: There is no abdominal tenderness. There is no guarding.      Hernia: A hernia is present.      Comments: Multiple abdominal wall hernias, soft reducible, non tender   Skin:     General: Skin is warm and dry.   Neurological:      General: No focal deficit present.      Mental Status: He is alert and oriented to person, place, and time. Mental status is at baseline.   Psychiatric:         Mood and Affect: Mood normal.         Behavior: Behavior normal.         Thought Content: Thought content normal.         Judgment: Judgment normal.         Laboratory Data:  Recent Results (from the past 2016 hour(s))   CBC WITH AUTOMATED DIFF    Collection Time: 01/31/21  8:45 AM   Result Value Ref Range    WBC 9.2 4.6 - 13.2 K/uL    RBC 5.04 4.35 - 5.65 M/uL    HGB 14.0 13.0 - 16.0 g/dL    HCT 43.6 36.0 - 48.0 %    MCV 86.5 78.0 - 100.0 FL    MCH 27.8 24.0 - 34.0 PG    MCHC 32.1 31.0 - 37.0 g/dL    RDW 14.9 (H) 11.6 - 14.5 %    PLATELET 270 135 - 420 K/uL    MPV 9.8 9.2 - 11.8 FL    NRBC 0.0 0 PER 100 WBC    ABSOLUTE NRBC 0.00 0.00 - 0.01 K/uL    NEUTROPHILS 59 40 - 73 %    LYMPHOCYTES 22 21 - 52 %    MONOCYTES 17 (H) 3 - 10 %    EOSINOPHILS 1 0 - 5 %    BASOPHILS 1 0 - 2 %    IMMATURE GRANULOCYTES 1 (H) 0.0 - 0.5 %    ABS. NEUTROPHILS 5.4 1.8 - 8.0 K/UL    ABS. LYMPHOCYTES 2.0 0.9 - 3.6 K/UL    ABS. MONOCYTES 1.6 (H) 0.05 - 1.2 K/UL    ABS. EOSINOPHILS 0.1 0.0 -  0.4 K/UL    ABS. BASOPHILS 0.1 0.0 - 0.1 K/UL    ABS. IMM. GRANS. 0.1 (H) 0.00 -  0.04 K/UL    DF AUTOMATED     METABOLIC PANEL, COMPREHENSIVE    Collection Time: 01/31/21  8:45 AM   Result Value Ref Range    Sodium 142 136 - 145 mmol/L    Potassium 3.5 3.5 - 5.5 mmol/L    Chloride 107 100 - 111 mmol/L    CO2 28 21 - 32 mmol/L    Anion gap 7 3.0 - 18 mmol/L    Glucose 102 (H) 74 - 99 mg/dL    BUN 11 7.0 - 18 MG/DL    Creatinine 0.99 0.6 - 1.3 MG/DL    BUN/Creatinine ratio 11 (L) 12 - 20      GFR est AA >60 >60 ml/min/1.47m    GFR est non-AA >60 >60 ml/min/1.79m   Calcium 8.9 8.5 - 10.1 MG/DL    Bilirubin, total 0.4 0.2 - 1.0 MG/DL    ALT (SGPT) 34 16 - 61 U/L    AST (SGOT) 18 10 - 38 U/L    Alk. phosphatase 106 45 - 117 U/L    Protein, total 6.8 6.4 - 8.2 g/dL    Albumin 2.8 (L) 3.4 - 5.0 g/dL    Globulin 4.0 2.0 - 4.0 g/dL    A-G Ratio 0.7 (L) 0.8 - 1.7     LIPASE    Collection Time: 01/31/21  8:45 AM   Result Value Ref Range    Lipase 164 73 - 393 U/L   MAGNESIUM    Collection Time: 01/31/21  8:45 AM   Result Value Ref Range    Magnesium 1.8 1.6 - 2.6 mg/dL   POC LACTIC ACID    Collection Time: 01/31/21  8:49 AM   Result Value Ref Range    Lactic Acid (POC) 2.13 (HH) 0.40 - 2.00 mmol/L   EKG, 12 LEAD, INITIAL    Collection Time: 01/31/21  8:53 AM   Result Value Ref Range    Ventricular Rate 98 BPM    Atrial Rate 98 BPM    P-R Interval 138 ms    QRS Duration 84 ms    Q-T Interval 334 ms    QTC Calculation (Bezet) 426 ms    Calculated P Axis 79 degrees    Calculated R Axis 64 degrees    Calculated T Axis 76 degrees    Diagnosis       Normal sinus rhythm  Normal ECG  No previous ECGs available     URINALYSIS W/ RFLX MICROSCOPIC    Collection Time: 01/31/21  9:00 AM   Result Value Ref Range    Color YELLOW      Appearance CLEAR      Specific gravity 1.026 1.005 - 1.030      pH (UA) 6.5 5.0 - 8.0      Protein TRACE (A) NEG mg/dL    Glucose Negative NEG mg/dL    Ketone Negative NEG mg/dL    Bilirubin Negative NEG       Blood Negative NEG      Urobilinogen 0.2 0.2 - 1.0 EU/dL    Nitrites Negative NEG      Leukocyte Esterase Negative NEG     URINE MICROSCOPIC ONLY    Collection Time: 01/31/21  9:00 AM   Result Value Ref Range    WBC 1 to 4 0 - 4 /hpf    RBC Negative 0 - 5 /hpf    Epithelial cells FEW 0 - 5 /lpf    Bacteria FEW (A) NEG /hpf  Mucus 2+ (A) NEG /lpf    CA Oxalate crystals 2+ (A) NEG   DRUG SCREEN, URINE    Collection Time: 01/31/21  9:00 AM   Result Value Ref Range    BENZODIAZEPINES Negative NEG      BARBITURATES Negative NEG      THC (TH-CANNABINOL) Positive (A) NEG      OPIATES Positive (A) NEG      PCP(PHENCYCLIDINE) Negative NEG      COCAINE Negative NEG      AMPHETAMINES Negative NEG      METHADONE Negative NEG      HDSCOM (NOTE)    POC LACTIC ACID    Collection Time: 01/31/21  1:04 PM   Result Value Ref Range    Lactic Acid (POC) 1.03 0.40 - 2.68 mmol/L   METABOLIC PANEL, BASIC    Collection Time: 02/01/21  5:34 AM   Result Value Ref Range    Sodium 137 136 - 145 mmol/L    Potassium 3.3 (L) 3.5 - 5.5 mmol/L    Chloride 107 100 - 111 mmol/L    CO2 24 21 - 32 mmol/L    Anion gap 6 3.0 - 18 mmol/L    Glucose 81 74 - 99 mg/dL    BUN 11 7.0 - 18 MG/DL    Creatinine 0.89 0.6 - 1.3 MG/DL    BUN/Creatinine ratio 12 12 - 20      GFR est AA >60 >60 ml/min/1.29m    GFR est non-AA >60 >60 ml/min/1.738m   Calcium 7.5 (L) 8.5 - 10.1 MG/DL   CBC W/O DIFF    Collection Time: 02/01/21  5:34 AM   Result Value Ref Range    WBC 8.3 4.6 - 13.2 K/uL    RBC 4.10 (L) 4.35 - 5.65 M/uL    HGB 11.4 (L) 13.0 - 16.0 g/dL    HCT 34.7 (L) 36.0 - 48.0 %    MCV 84.6 78.0 - 100.0 FL    MCH 27.8 24.0 - 34.0 PG    MCHC 32.9 31.0 - 37.0 g/dL    RDW 14.9 (H) 11.6 - 14.5 %    PLATELET 237 135 - 420 K/uL    MPV 9.8 9.2 - 11.8 FL    NRBC 0.0 0 PER 100 WBC    ABSOLUTE NRBC 0.00 0.00 - 0.01 K/uL       Diagnostic Studies:  Study Result    Narrative & Impression   CT ABDOMEN AND PELVIS WITH ENHANCEMENT  ??  INDICATION: History of Crohn's disease with  nausea and vomiting since last  night, constipation, reversal of ileostomy 4/21.  ??  TECHNIQUE: Axial images obtained of the abdomen and pelvis following the  uneventful administration of nonionic intravenous contrast.  Coronal and  sagittal reformatted images of the abdomen and pelvis were obtained.  ??  All CT scans at this facility are performed using dose optimization technique as  appropriate to a performed exam, to include automated exposure control,  adjustment of the mA and/or kV according to patient size (including appropriate  matching first site-specific examinations), or use of iterative reconstruction  technique.  ??  COMPARISON: Outside CT report from 01/22/2021 reports dilated small bowel loops.  No direct comparison imaging available.     ABDOMEN FINDINGS:   ??  Liver: Unremarkable.  Spleen: Unremarkable.  Pancreas: Unremarkable.   Biliary: Cholecystectomy.  Bowel: Duodenum does not cross midline into the left abdomen. Unable to follow  bowel loops throughout since several  of the bowel loops are decompressed and  there is a paucity of mesenteric fat. There is a markedly dilated small bowel  loop distally measures up to 6.3 cm (coronal 22). The dilated bowel loop is  adjacent to bowel resection. There is small bowel feces and mild small bowel  wall thickening distally. Colon is not decompressed. There may be a stricture at  the small bowel anastomosis.  Peritoneum/ Retroperitoneum: No free air. No free or loculated fluid.  There is  generalized edema.  Lymph Nodes: There is mesenteric lymphadenopathy including a cluster of the  patella and 3.2 x 2.3 cm midline nodes (series 2, image 40).  ??  Adrenal Glands: Unremarkable.  Kidneys: Unremarkable.  ??  Vessels: IVC is flattened. There is contrast in collateral vessels left body  wall and left azygos.  ??  PELVIS FINDINGS:   ??  Bladder/ Pelvic Organs: Unremarkable.  ??  Lung Base: Unremarkable.  ??  Bones: Mild anasarca. Avascular necrosis right femoral head.  Straightening  thoracolumbar spine. Minimal retrolisthesis L4-5.    ??  IMPRESSION  1.  Small bowel obstruction, with potential transition point/stricture at the  small bowel anastomosis distal ileum, though colon is not decompressed. Unable  to compare the degree of small bowel dilation given lack of prior imaging but  small bowel was reportedly dilated. The dilated small bowel loops have a  thickened wall. It is unclear if this is inflammation related to Crohn's or  secondary to dilation; ischemia is possible. No pneumatosis.  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  3.  Mesenteric lymphadenopathy which is likely reactive.  4.  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  5.  Hypovolemia.  6.  Mild anasarca.  7.  Avascular necrosis right femoral head.         Nestor Ramp, [place]

## 2021-02-01 NOTE — Progress Notes (Signed)
Bedside shift change report given to Merrill, RN (oncoming nurse) by Callie, RN (offgoing nurse). Report included the following information SBAR, Kardex, Intake/Output, MAR and Recent Results.

## 2021-02-02 LAB — CBC
Hematocrit: 33.8 % — ABNORMAL LOW (ref 36.0–48.0)
Hemoglobin: 11.1 g/dL — ABNORMAL LOW (ref 13.0–16.0)
MCH: 27.9 PG (ref 24.0–34.0)
MCHC: 32.8 g/dL (ref 31.0–37.0)
MCV: 84.9 FL (ref 78.0–100.0)
MPV: 9.8 FL (ref 9.2–11.8)
NRBC Absolute: 0 10*3/uL (ref 0.00–0.01)
Nucleated RBCs: 0 PER 100 WBC
Platelets: 221 10*3/uL (ref 135–420)
RBC: 3.98 M/uL — ABNORMAL LOW (ref 4.35–5.65)
RDW: 14.7 % — ABNORMAL HIGH (ref 11.6–14.5)
WBC: 6 10*3/uL (ref 4.6–13.2)

## 2021-02-02 LAB — BASIC METABOLIC PANEL
Anion Gap: 10 mmol/L (ref 3.0–18)
BUN: 13 MG/DL (ref 7.0–18)
Bun/Cre Ratio: 16 (ref 12–20)
CO2: 20 mmol/L — ABNORMAL LOW (ref 21–32)
Calcium: 7.8 MG/DL — ABNORMAL LOW (ref 8.5–10.1)
Chloride: 107 mmol/L (ref 100–111)
Creatinine: 0.82 MG/DL (ref 0.6–1.3)
EGFR IF NonAfrican American: >60 mL/min/{1.73_m2} (ref >60)
GFR African American: >60 mL/min/{1.73_m2} (ref >60)
Glucose: 64 mg/dL — ABNORMAL LOW (ref 74–99)
Potassium: 3.6 mmol/L (ref 3.5–5.5)
Sodium: 137 mmol/L (ref 136–145)

## 2021-02-02 LAB — HEP A AB, TOTAL
HEP A AB, TOTAL, 006726: NEGATIVE
Hep A Ab, total: NEGATIVE

## 2021-02-02 LAB — METABOLIC PANEL, BASIC
Anion gap: 10 mmol/L (ref 3.0–18)
BUN/Creatinine ratio: 16 (ref 12–20)
BUN: 13 MG/DL (ref 7.0–18)
CO2: 20 mmol/L — ABNORMAL LOW (ref 21–32)
Calcium: 7.8 MG/DL — ABNORMAL LOW (ref 8.5–10.1)
Chloride: 107 mmol/L (ref 100–111)
Creatinine: 0.82 mg/dL (ref 0.6–1.3)
GFR est AA: >60 mL/min/{1.73_m2} (ref >60)
GFR est non-AA: >60 mL/min/{1.73_m2} (ref >60)
Glucose: 64 mg/dL — ABNORMAL LOW (ref 74–99)
Potassium: 3.6 mmol/L (ref 3.5–5.5)
Sodium: 137 mmol/L (ref 136–145)

## 2021-02-02 LAB — CBC W/O DIFF
ABSOLUTE NRBC: 0 10*3/uL (ref 0.00–0.01)
HCT: 33.8 % — ABNORMAL LOW (ref 36.0–48.0)
HGB: 11.1 g/dL — ABNORMAL LOW (ref 13.0–16.0)
MCH: 27.9 pg (ref 24.0–34.0)
MCHC: 32.8 g/dL (ref 31.0–37.0)
MCV: 84.9 fL (ref 78.0–100.0)
MPV: 9.8 fL (ref 9.2–11.8)
NRBC: 0 /100{WBCs}
PLATELET: 221 10*3/uL (ref 135–420)
RBC: 3.98 M/uL — ABNORMAL LOW (ref 4.35–5.65)
RDW: 14.7 % — ABNORMAL HIGH (ref 11.6–14.5)
WBC: 6 10*3/uL (ref 4.6–13.2)

## 2021-02-02 LAB — HEPATITIS B CORE AB, TOTAL: Hep B Core Ab, total: NEGATIVE

## 2021-02-02 LAB — HEPATITIS B CORE ANTIBODY, TOTAL: Hep B Core Total Ab: NEGATIVE

## 2021-02-02 MED ORDER — SODIUM CHLORIDE 0.9 % IV
Freq: Once | INTRAVENOUS | Status: AC
Start: 2021-02-02 — End: 2021-02-02
  Administered 2021-02-02: 18:00:00 via INTRAVENOUS

## 2021-02-02 MED FILL — DIPHENHYDRAMINE HCL 50 MG/ML IJ SOLN: 50 mg/mL | INTRAMUSCULAR | Qty: 1

## 2021-02-02 MED FILL — KETOROLAC TROMETHAMINE 15 MG/ML INJECTION: 15 mg/mL | INTRAMUSCULAR | Qty: 1

## 2021-02-02 MED FILL — SODIUM CHLORIDE 0.9 % IV: INTRAVENOUS | Qty: 500

## 2021-02-02 NOTE — Progress Notes (Signed)
INTERIM UPDATE - 0208 EST on 02/02/2021    Nursing Staff reports that Patient's NG Tube "fell out" spontaneously when Patient moved and Patient is currently refusing to have the NG Tube replaced tonight.  Nursing Staff reports that she was coming to see the Patient when the KUB results returned after NG Tube placement earlier yesterday.    Patient refuses to have NG Tube replaced tonight and states that he is not nauseated and is having not pain.    Nursing Staff reports that no significant medications will be missed at this time.    Plan:  Discuss replacement of NG Tube with Patient in AM.

## 2021-02-02 NOTE — Progress Notes (Signed)
Progress Note  Hospitalist Service    Patient: Jonathan Yoder MRN: [id number]   SSN: [id number]  Date of Birth: [birth year]   Age: 45 y.o.  Sex: male      Admit Date: 01/31/2021    LOS: 2 days   Chief Complaint   Patient presents with   ??? Abdominal Pain       Subjective:     Admitted  secondary to SBO.  NG came out earlier this morning.    Patient states he has had bowel movement x2.   Abdomen is soft, non-tender.   Patient denies abdominal pain, n/v.     Objective:     Vitals:  Visit Vitals  BP 117/79   Pulse (!) 106   Temp 98.2 ??F (36.8 ??C)   Resp 18   Ht 5\' 8"  (1.727 m)   Wt 58.5 kg (129 lb)   SpO2 98%   BMI 19.61 kg/m??       Physical Exam:   General appearance: alert, cooperative, no distress, appears stated age  Lungs: clear to auscultation bilaterally  Heart: regular rate and rhythm, S1, S2 normal, no murmur, click, rub or gallop  Abdomen: soft, non-tender. Bowel sounds normal. No masses,  no organomegaly. Multiple surgical scars.   Pulses: 2+ and symmetric  Skin: Skin color, texture, turgor normal. No rashes or lesions  Neuro:  normal without focal findings  mental status, speech normal, alert and oriented x iii  PERLA  reflexes normal and symmetric    Intake and Output:  Current Shift: No intake/output data recorded.  Last three shifts: 07/01 0701 - 07/02 1900  In: 1708 [I.V.:1708]  Out: 300 [Urine:300]    Lab/Data Review:  No results found for this or any previous visit (from the past 12 hour(s)).      Key Findings or tests:       Telemetry NONE   Oxygen NONE     Assessment and Plan:     ??  SBO: ??potential transition point/stricture at the small bowel anastomosis  on CT  - appreciate GS assistance  - Advance diet to liquids.   - IV fluids.   - PRN pain control with toradol. Reserved narcotics for severe pain.  ??  Crohn's disease  - GI consulted. Signed off. Will see patient as an OP.   - Should be on humira and mesalamine. Has not had humira injection in 4 weeks  ??  Lactic acidosis: due to dehydration.  Resolved.      Diet Liquid    DVT Prophylax SCDs   GI Prophylaxis    Code status Full    Disposition Home when SBO resolves         09/02, DO, hospitalist   February 02, 2021

## 2021-02-02 NOTE — Progress Notes (Signed)
Checked with Pt on rounds after KUB results noted. NGT out and Pt states it "fell out" when he moved. Pt refuses reinsertion of NGT. States no pain, no nausea and will talk to providers in AM.

## 2021-02-02 NOTE — Progress Notes (Signed)
 Visit Vitals  BP (!) 96/59   Pulse (!) 102   Temp 97.9 F (36.6 C)   Resp 18   Ht 5' 8 (1.727 m)   Wt 58.5 kg (129 lb)   SpO2 99%   BMI 19.61 kg/m     Page placed to [place], DO. Patient in no acute s/s of distress. Resting in bed at this time.    1325: Hall, DO on unit for rounding. Relayed information to her and received order for 500mL 0.9% NS bolus. Order placed in computer and IV pump programmed for bolus. No complaints at this time. Will continue to monitor.

## 2021-02-02 NOTE — Progress Notes (Signed)
Problem: Falls - Risk of  Goal: *Absence of Falls  Description: Document Schmid Fall Risk and appropriate interventions in the flowsheet.  Outcome: Progressing Towards Goal  Note: Fall Risk Interventions:  Mobility Interventions: Bed/chair exit alarm         Medication Interventions: Teach patient to arise slowly

## 2021-02-03 LAB — BASIC METABOLIC PANEL
Anion Gap: 9 mmol/L (ref 3.0–18)
BUN: 6 MG/DL — ABNORMAL LOW (ref 7.0–18)
Bun/Cre Ratio: 8 — ABNORMAL LOW (ref 12–20)
CO2: 21 mmol/L (ref 21–32)
Calcium: 7.4 MG/DL — ABNORMAL LOW (ref 8.5–10.1)
Chloride: 108 mmol/L (ref 100–111)
Creatinine: 0.79 MG/DL (ref 0.6–1.3)
EGFR IF NonAfrican American: >60 mL/min/{1.73_m2} (ref >60)
GFR African American: >60 mL/min/{1.73_m2} (ref >60)
Glucose: 88 mg/dL (ref 74–99)
Potassium: 3.3 mmol/L — ABNORMAL LOW (ref 3.5–5.5)
Sodium: 138 mmol/L (ref 136–145)

## 2021-02-03 LAB — CBC
Hematocrit: 31 % — ABNORMAL LOW (ref 36.0–48.0)
Hemoglobin: 10.3 g/dL — ABNORMAL LOW (ref 13.0–16.0)
MCH: 28.1 PG (ref 24.0–34.0)
MCHC: 33.2 g/dL (ref 31.0–37.0)
MCV: 84.7 FL (ref 78.0–100.0)
MPV: 9.8 FL (ref 9.2–11.8)
NRBC Absolute: 0 10*3/uL (ref 0.00–0.01)
Nucleated RBCs: 0 PER 100 WBC
Platelets: 198 10*3/uL (ref 135–420)
RBC: 3.66 M/uL — ABNORMAL LOW (ref 4.35–5.65)
RDW: 14.6 % — ABNORMAL HIGH (ref 11.6–14.5)
WBC: 3.9 10*3/uL — ABNORMAL LOW (ref 4.6–13.2)

## 2021-02-03 LAB — CBC W/O DIFF
ABSOLUTE NRBC: 0 10*3/uL (ref 0.00–0.01)
HCT: 31 % — ABNORMAL LOW (ref 36.0–48.0)
HGB: 10.3 g/dL — ABNORMAL LOW (ref 13.0–16.0)
MCH: 28.1 pg (ref 24.0–34.0)
MCHC: 33.2 g/dL (ref 31.0–37.0)
MCV: 84.7 fL (ref 78.0–100.0)
MPV: 9.8 fL (ref 9.2–11.8)
NRBC: 0 /100{WBCs}
PLATELET: 198 10*3/uL (ref 135–420)
RBC: 3.66 M/uL — ABNORMAL LOW (ref 4.35–5.65)
RDW: 14.6 % — ABNORMAL HIGH (ref 11.6–14.5)
WBC: 3.9 10*3/uL — ABNORMAL LOW (ref 4.6–13.2)

## 2021-02-03 LAB — METABOLIC PANEL, BASIC
Anion gap: 9 mmol/L (ref 3.0–18)
BUN/Creatinine ratio: 8 — ABNORMAL LOW (ref 12–20)
BUN: 6 MG/DL — ABNORMAL LOW (ref 7.0–18)
CO2: 21 mmol/L (ref 21–32)
Calcium: 7.4 MG/DL — ABNORMAL LOW (ref 8.5–10.1)
Chloride: 108 mmol/L (ref 100–111)
Creatinine: 0.79 mg/dL (ref 0.6–1.3)
GFR est AA: >60 mL/min/{1.73_m2} (ref >60)
GFR est non-AA: >60 mL/min/{1.73_m2} (ref >60)
Glucose: 88 mg/dL (ref 74–99)
Potassium: 3.3 mmol/L — ABNORMAL LOW (ref 3.5–5.5)
Sodium: 138 mmol/L (ref 136–145)

## 2021-02-03 MED FILL — KETOROLAC TROMETHAMINE 15 MG/ML INJECTION: 15 mg/mL | INTRAMUSCULAR | Qty: 1

## 2021-02-03 MED FILL — DIPHENHYDRAMINE HCL 50 MG/ML IJ SOLN: 50 mg/mL | INTRAMUSCULAR | Qty: 1

## 2021-02-03 MED FILL — MAPAP (ACETAMINOPHEN) 325 MG TABLET: 325 mg | ORAL | Qty: 2

## 2021-02-03 MED FILL — PROTONIX 40 MG INTRAVENOUS SOLUTION: 40 mg | INTRAVENOUS | Qty: 40

## 2021-02-03 NOTE — Progress Notes (Signed)
Progress Note  Hospitalist Service    Patient: Jonathan Yoder MRN: [id number]   SSN: [id number]  Date of Birth: 1976/04/15   Age: 45 y.o.  Sex: male      Admit Date: 01/31/2021    LOS: 3 days   Chief Complaint   Patient presents with   ??? Abdominal Pain       Subjective:     Patient moving bowels. Managed liquid diet.  Endorses hunger. No abdominal pain.     Objective:     Vitals:  Visit Vitals  BP 123/80 (BP 1 Location: Left upper arm, BP Patient Position: At rest)   Pulse 96   Temp 98.2 ??F (36.8 ??C)   Resp 22   Ht 5\' 8"  (1.727 m)   Wt 58.5 kg (129 lb)   SpO2 95%   BMI 19.61 kg/m??       Physical Exam:   General appearance: alert, cooperative, no distress, appears stated age  Lungs: clear to auscultation bilaterally  Heart: regular rate and rhythm, S1, S2 normal, no murmur, click, rub or gallop  Abdomen: soft, non-tender. Bowel sounds normal. No masses,  no organomegaly. Multiple surgical scars.   Pulses: 2+ and symmetric  Skin: Skin color, texture, turgor normal. No rashes or lesions  Neuro:  normal without focal findings  mental status, speech normal, alert and oriented x iii  PERLA  reflexes normal and symmetric    Intake and Output:  Current Shift: 07/03 0701 - 07/03 1900  In: 2581 [P.O.:1680; I.V.:901]  Out: 300 [Urine:300]  Last three shifts: 07/01 1901 - 07/03 0700  In: 3813 [P.O.:960; I.V.:2853]  Out: 1100 [Urine:1100]    Lab/Data Review:  No results found for this or any previous visit (from the past 12 hour(s)).      Key Findings or tests:       Telemetry NONE   Oxygen NONE     Assessment and Plan:     ??  SBO: ??potential transition point/stricture at the small bowel anastomosis  on CT  - appreciate GS assistance  - Advance diet to regular   - IV fluids.   - PRN pain control with toradol. Reserved narcotics for severe pain.  ??  Crohn's disease  - GI consulted. Signed off. Will see patient as an OP.   - Should be on humira and mesalamine. Has not had humira injection in 4 weeks  ??  Lactic acidosis: due to  dehydration. Resolved.      Diet Liquid    DVT Prophylax SCDs   GI Prophylaxis    Code status Full    Disposition Home when SBO resolves         09/03, DO, hospitalist   February 03, 2021

## 2021-02-03 NOTE — Progress Notes (Signed)
Problem: Falls - Risk of  Goal: *Absence of Falls  Description: Document Schmid Fall Risk and appropriate interventions in the flowsheet.  Outcome: Progressing Towards Goal  Note: Fall Risk Interventions:  Mobility Interventions: Bed/chair exit alarm         Medication Interventions: Teach patient to arise slowly

## 2021-02-03 NOTE — Progress Notes (Signed)
Wound Prevention Checklist    Patient: Jonathan Yoder (45 y.o. male)  Date: 02/03/2021  Diagnosis: SBO (small bowel obstruction) ([facility]) [K56.609]  Elevated lactic acid level [R79.89] Crohn's disease with intestinal obstruction ([facility])    Precautions:         []   Heel prevention boots placed on patient    []   Patient turned q2h during shift    []   Lift team ordered    [x]   Patient on [facility] bed/Specialty bed    []   Each Wound is documented during shift (Stage, Color, drainage, odor, measurements, and dressings)    [x]   Dual skin checks done at bedside during shift report with , RN    

## 2021-02-04 MED FILL — DIPHENHYDRAMINE HCL 50 MG/ML IJ SOLN: 50 mg/mL | INTRAMUSCULAR | Qty: 1

## 2021-02-04 MED FILL — KETOROLAC TROMETHAMINE 15 MG/ML INJECTION: 15 mg/mL | INTRAMUSCULAR | Qty: 1

## 2021-02-04 MED FILL — PROTONIX 40 MG INTRAVENOUS SOLUTION: 40 mg | INTRAVENOUS | Qty: 40

## 2021-02-04 MED FILL — ONDANSETRON 4 MG TAB, RAPID DISSOLVE: 4 mg | ORAL | Qty: 1

## 2021-02-04 NOTE — Progress Notes (Signed)
Problem: Falls - Risk of  Goal: *Absence of Falls  Description: Document Schmid Fall Risk and appropriate interventions in the flowsheet.  Outcome: Progressing Towards Goal  Note: Fall Risk Interventions:  Mobility Interventions: Bed/chair exit alarm         Medication Interventions: Bed/chair exit alarm

## 2021-02-04 NOTE — Progress Notes (Signed)
Problem: Falls - Risk of  Goal: *Absence of Falls  Description: Document Jonathan Yoder Fall Risk and appropriate interventions in the flowsheet.  Outcome: Progressing Towards Goal  Note: Fall Risk Interventions:  Mobility Interventions: Bed/chair exit alarm,Patient to call before getting OOB         Medication Interventions: Bed/chair exit alarm,Patient to call before getting OOB,Teach patient to arise slowly                   Problem: Patient Education: Go to Patient Education Activity  Goal: Patient/Family Education  Outcome: Progressing Towards Goal     Problem: Pain  Goal: *Control of Pain  Outcome: Progressing Towards Goal  Goal: *PALLIATIVE CARE:  Alleviation of Pain  Outcome: Progressing Towards Goal     Problem: Pain  Goal: *Control of Pain  Outcome: Progressing Towards Goal  Goal: *PALLIATIVE CARE:  Alleviation of Pain  Outcome: Progressing Towards Goal     Problem: Patient Education: Go to Patient Education Activity  Goal: Patient/Family Education  Outcome: Progressing Towards Goal     Problem: Nausea/Vomiting (Adult)  Goal: *Absence of nausea/vomiting  Outcome: Progressing Towards Goal  Goal: *Palliation of nausea/vomiting (Palliative Care)  Outcome: Progressing Towards Goal     Problem: Patient Education: Go to Patient Education Activity  Goal: Patient/Family Education  Outcome: Progressing Towards Goal     Problem: Nutrition Deficit  Goal: *Optimize nutritional status  Outcome: Progressing Towards Goal     Problem: Patient Education: Go to Patient Education Activity  Goal: Patient/Family Education  Outcome: Progressing Towards Goal

## 2021-02-04 NOTE — Discharge Summary (Signed)
Discharge Summary by Henrietta Hoover, DO at 02/04/21 1327                Author: Henrietta Hoover, DO  Service: Hospitalist  Author Type: Physician       Filed: 02/10/21 1541  Date of Service: 02/04/21 1327  Status: Signed          Editor: Henrietta Hoover, DO (Physician)                    Discharge Summary          Patient: Jonathan Yoder  MRN: [id number]   CSN: [id number]          Date of Birth: 1975-11-26   Age: 45 y.o.   Sex: male          DOA: 01/31/2021  LOS:  LOS: 4 days    Discharge Date: 02/04/2021        Admission Diagnosis: SBO (small bowel obstruction) (HCC) [K56.609]   Elevated lactic acid level [R79.89]      Discharge Diagnosis:        Hospital Problems   Never Reviewed                         Codes  Class  Noted  POA              SBO (small bowel obstruction) (HCC)  ICD-10-CM: K56.609   ICD-9-CM: 560.9    01/31/2021  Unknown                        Elevated lactic acid level  ICD-10-CM: R79.89   ICD-9-CM: 276.2    01/31/2021  Unknown                        * (Principal) Crohn's disease with intestinal obstruction (HCC)  ICD-10-CM: Q46.962   ICD-9-CM: 555.9, 560.9    01/31/2021  Yes                          Discharge Condition: Stable   Discharge Disposition: Home with GI follow up planned       PHYSICAL EXAM   Visit Vitals      BP  105/65     Pulse  88     Temp  98.1 ??F (36.7 ??C)     Resp  20     Ht  5\' 8"  (1.727 m)     Wt  58.5 kg (129 lb)     SpO2  97%        BMI  19.61 kg/m??        General appearance: alert, cooperative, no distress, appears stated age   Lungs: clear to auscultation bilaterally   Heart: regular rate and rhythm, S1, S2 normal, no murmur, click, rub or gallop   Abdomen: soft, non-tender. Bowel sounds normal. No masses,  no organomegaly. Multiple surgical scars.    Pulses: 2+ and symmetric   Skin: Skin color, texture, turgor normal. No rashes or lesions   Neuro:  normal without focal findings   mental status, speech normal, alert and oriented x iii   PERLA   reflexes normal and  symmetric   ??      Hospital Course By Problem:    Small bowel obstruction:????potential transition point/stricture at the small bowel anastomosis????on CT. General  surgery consulted. Instructed to place  NG tube. No surgical intervention. Patient initially made NPO. Patient with bowel movement on 7/3. NG tube removed. Diet advanced. Patient discharged on 7/4.    ??   Crohn's disease - GI consulted. Signed off. Will see patient as an OP to see if it  is appropriate to restart Humira, mesalamine.   ??   Lactic acidosis:??due to dehydration. Resolved.      Consults:    GI - Dr. Reesa Chew      Significant Diagnostic Studies:   CT ABDOMEN AND PELVIS WITH ENHANCEMENT   ??   INDICATION: History of Crohn's disease with nausea and vomiting since last   night, constipation, reversal of ileostomy 4/21.   ??   TECHNIQUE: Axial images obtained of the abdomen and pelvis following the   uneventful administration of nonionic intravenous contrast.  Coronal and   sagittal reformatted images of the abdomen and pelvis were obtained.   ??   All CT scans at this facility are performed using dose optimization technique as   appropriate to a performed exam, to include automated exposure control,   adjustment of the mA and/or kV according to patient size (including appropriate   matching first site-specific examinations), or use of iterative reconstruction   technique.   ??   COMPARISON: Outside CT report from 01/22/2021 reports dilated small bowel loops.   No direct comparison imaging available.       ABDOMEN FINDINGS:    ??   Liver: Unremarkable.   Spleen: Unremarkable.   Pancreas: Unremarkable.    Biliary: Cholecystectomy.   Bowel: Duodenum does not cross midline into the left abdomen. Unable to follow   bowel loops throughout since several of the bowel loops are decompressed and   there is a paucity of mesenteric fat. There is a markedly dilated small bowel   loop distally measures up to 6.3 cm (coronal 22). The dilated bowel loop is   adjacent to  bowel resection. There is small bowel feces and mild small bowel   wall thickening distally. Colon is not decompressed. There may be a stricture at   the small bowel anastomosis.   Peritoneum/ Retroperitoneum: No free air. No free or loculated fluid.  There is   generalized edema.   Lymph Nodes: There is mesenteric lymphadenopathy including a cluster of the   patella and 3.2 x 2.3 cm midline nodes (series 2, image 40).   ??   Adrenal Glands: Unremarkable.   Kidneys: Unremarkable.   ??   Vessels: IVC is flattened. There is contrast in collateral vessels left body   wall and left azygos.   ??   PELVIS FINDINGS:    ??   Bladder/ Pelvic Organs: Unremarkable.   ??   Lung Base: Unremarkable.   ??   Bones: Mild anasarca. Avascular necrosis right femoral head. Straightening   thoracolumbar spine. Minimal retrolisthesis L4-5.     ??   IMPRESSION   1.  Small bowel obstruction, with potential transition point/stricture at the   small bowel anastomosis distal ileum, though colon is not decompressed. Unable   to compare the degree of small bowel dilation given lack of prior imaging but   small bowel was reportedly dilated. The dilated small bowel loops have a   thickened wall. It is unclear if this is inflammation related to Crohn's or   secondary to dilation; ischemia is possible. No pneumatosis.   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   3.  Mesenteric lymphadenopathy which is likely reactive.  4.  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   5.  Hypovolemia.   6.  Mild anasarca.   7.  Avascular necrosis right femoral head.      Discharge Medications:        Discharge Medication List as of 02/04/2021 11:45 AM              CONTINUE these medications which have NOT CHANGED          Details        adalimumab (Humira,CF, Pen) 40 mg/0.4 mL injection pen  40 mg by SubCUTAneous route Once every 2 weeks., Historical Med               mesalamine (PENTASA) 250 mg CR capsule  Take 250 mg  by mouth four (4) times daily., Historical Med               pantoprazole (PROTONIX) 40 mg tablet  Take 40 mg by mouth Daily (before breakfast)., Historical Med                         Activity: activity as tolerated      Diet: regular       Wound Care: None needed      Follow-up: with PCP, Other, Phys, MD  in 7-10days      Minutes spent on discharge: >30 minutes spent coordinating this discharge (review instructions/follow-up, prescriptions, preparing report for sign off)      Henrietta HooverBrittney N Kealani Leckey, DO

## 2021-02-04 NOTE — Progress Notes (Signed)
Completed discharge instructions with patient. Provided patient opportunity to ask questions. All questions answered to patient's satisfaction. Patient being discharged to home. Midline removed. Patient in room, waiting for ride.

## 2021-02-04 NOTE — Progress Notes (Signed)
D/C order noted for today. Orders reviewed. No needs identified at this time.The patient's friend will transport home.  CM remains available if needed.    Osker Mason, BSN, RN  Care Management  Pager # [phone]

## 2021-02-06 LAB — CULTURE, BLOOD 1: Culture: NO GROWTH

## 2021-02-06 LAB — CULTURE, BLOOD: Culture result:: NO GROWTH

## 2021-02-07 LAB — QUANTIFERON-TB PLUS(CLIENT INCUB.)
QUANTIFERON PLUS: NEGATIVE
QUANTIFERON TB1 AG: 0.4 IU/mL
QUANTIFERON TB2 AG: 0.37 IU/mL
QuantiFERON Mitogen Value: >10 IU/mL
QuantiFERON Mitogen Value: >10 IU/mL
QuantiFERON Nil Value: 0.35 IU/mL
QuantiFERON Nil Value: 0.35 IU/mL
QuantiFERON Plus: NEGATIVE
QuantiFERON TB1 Ag: 0.4 IU/mL
QuantiFERON TB2 Ag: 0.37 IU/mL

## 2021-02-11 LAB — ADALIMUMAB+AB
Adalimumab antibody: <25 ng/mL
Adalimumab level: 1.4 ug/mL

## 2021-02-11 LAB — INFLIXIMAB (IFX) CONC+ IFX AB
Anti-Infliximab Ab: <22 ng/mL
Infliximag: <0.4 ug/mL

## 2022-04-01 DIAGNOSIS — K50919 Crohn's disease, unspecified, with unspecified complications: Principal | ICD-10-CM

## 2022-04-04 ENCOUNTER — Ambulatory Visit: Admit: 2022-04-04 | Discharge: 2022-04-04 | Disposition: A | Discharge status: Home / Self Care | Payer: MEDICAID

## 2022-04-04 ENCOUNTER — Emergency Department: Admit: 2022-04-04 | Discharge: 2022-04-04 | Disposition: A | Discharge status: Home / Self Care | Payer: MEDICAID

## 2022-04-04 DIAGNOSIS — K50919 Crohn's disease, unspecified, with unspecified complications: Principal | ICD-10-CM

## 2022-04-04 DIAGNOSIS — R109 Unspecified abdominal pain: Principal | ICD-10-CM

## 2022-04-04 MED ORDER — FAMOTIDINE 20 MG TABLET
ORAL_TABLET | Freq: Two times a day (BID) | ORAL | 0 refills | 15 days | Status: CP
Start: 2022-04-04 — End: 2022-04-19

## 2022-04-04 MED ORDER — PREDNISONE 20 MG TABLET
ORAL_TABLET | 0 refills | 0 days | Status: CP
Start: 2022-04-04 — End: ?

## 2022-04-05 ENCOUNTER — Ambulatory Visit: Admit: 2022-04-05 | Discharge: 2022-04-07 | Disposition: A | Discharge status: Home / Self Care | Payer: MEDICAID

## 2022-04-07 MED ORDER — OXYCODONE 5 MG TABLET
ORAL_TABLET | Freq: Two times a day (BID) | ORAL | 0 refills | 3 days | Status: CP | PRN
Start: 2022-04-07 — End: ?
  Filled 2022-04-07: qty 5, 3d supply, fill #0

## 2022-04-07 MED ORDER — VANCOMYCIN 125 MG CAPSULE
ORAL_CAPSULE | Freq: Four times a day (QID) | ORAL | 0 refills | 9 days | Status: CP
Start: 2022-04-07 — End: 2022-04-16
  Filled 2022-04-07: qty 36, 9d supply, fill #0

## 2022-04-07 MED ORDER — FAMOTIDINE 20 MG TABLET
ORAL_TABLET | Freq: Two times a day (BID) | ORAL | 0 refills | 30 days | Status: CP | PRN
Start: 2022-04-07 — End: ?
  Filled 2022-04-07: qty 60, 30d supply, fill #0

## 2022-04-08 ENCOUNTER — Ambulatory Visit
Admit: 2022-04-08 | Discharge: 2022-04-08 | Disposition: A | Discharge status: Home / Self Care | Payer: MEDICAID | Attending: Emergency Medicine

## 2022-04-08 DIAGNOSIS — K50918 Crohn's disease, unspecified, with other complication: Principal | ICD-10-CM

## 2022-04-08 MED ORDER — HUMIRA PEN CITRATE FREE 40 MG/0.4 ML
SUBCUTANEOUS | 5 refills | 28 days | Status: CP
Start: 2022-04-08 — End: ?
  Filled 2022-04-11: qty 2, 28d supply, fill #0

## 2022-04-08 MED ADMIN — acetaminophen (TYLENOL) tablet 1,000 mg: 1000 mg | ORAL | @ 07:00:00 | Stop: 2022-04-08

## 2022-04-08 MED ADMIN — diazePAM (VALIUM) tablet 5 mg: 5 mg | ORAL | @ 07:00:00 | Stop: 2022-04-08

## 2022-04-08 MED ADMIN — lidocaine (LIDODERM) 5 % patch 1 patch: 1 | TRANSDERMAL | @ 07:00:00 | Stop: 2022-04-08

## 2022-04-08 NOTE — Unmapped (Signed)
Pt c/o lower back pain, was helping to move a table. Took 2 Ibu no relief. PS 8/10

## 2022-04-08 NOTE — Unmapped (Signed)
[facility] Specialty Medication Onboarding    Specialty Medication: HUMIRA(CF) PEN 40 mg/0.4 mL injection (adalimumab)  Prior Authorization: Not Required   Financial Assistance: No - copay  <$25  Final Copay/Day Supply: $4 / 28    Insurance Restrictions: None     Notes to Pharmacist: n/a    The triage team has completed the benefits investigation and has determined that the patient is able to fill this medication at [facility]. Please contact the patient to complete the onboarding or follow up with the prescribing physician as needed.

## 2022-04-08 NOTE — Unmapped (Signed)
Gastroenterology (Luminal) Consult Service   Progress Note         Assessment & Plan:   Jerome Kennedy is a 46 y.o. male with a PMHx of Crohn's disease of small and large intestine (dx 1990's) s/p ileocecectomy (2003), ventral abdominal hernia and incisional hernia s/p repair (2004) p/w n/v, RLQ pain and diarrhea x 1 day. ESR 26, CRP 26, no leukocytosis. CTAP with contrast shows segmental narrowing with wall thickening in the proximal colon c/f peristalsis vs stricture. Workup significant for positive C diff PCR.     Re: CD, Patient has been compliant with humira as an outpatient with last dose 1 week ago (on humira 40 mg q2 weeks) however has run out of medication and is now living in a [employer]. Prior to this he reportedly failed Remicade.      Recommendations  - PO vancomycin for treatment of C diff (likely recurrent based on history)   - in-basket sent to GI schedulers for follow-up of C diff in fellows hospital discharge clinic in 2 weeks and follow-up with me in November for his Crohn's disease  - in-basket sent for help with humira approval - he can continue this prior to following up with me     I have communicated recommendations with the patient's primary team    Thank you for involving [place] in the care of your patient. We will continue to follow along with you.     For questions, contact the on-call fellow for the Gastroenterology (Luminal) Consult Service at [phone].     I saw and evaluated the patient, participating in the key portions of the service.  I reviewed the resident???s note.  I agree with the resident???s findings and plan. Jerome Nan, MD, PhD           Interval History:   No acute events overnight.  Pt with one BM today that was formed, no longer with diarrhea.     Objective:   Temp:  [36.8 ??C (98.2 ??F)-36.9 ??C (98.4 ??F)] 36.8 ??C (98.2 ??F)  Heart Rate:  [72-77] 72  Resp:  [16-18] 18  BP: (104-114)/(69-79) 104/69  SpO2:  [100 %] 100 %    Gen: WDWN male in NAD, answers questions appropriately  Abdomen: Soft, NTND, no rebound/guarding, no hepatosplenomegaly  Extremities: No edema in the BLEs    Pertinent Labs/Studies Reviewed:  CMP   CBC  C diff

## 2022-04-08 NOTE — Unmapped (Signed)
[facility]  Emergency Department Provider Note    ED Clinical Impression     Final diagnoses:   Strain of lumbar region, initial encounter (Primary)       HPI, ED Course, Assessment and Plan     Initial Clinical Impression:    April 08, 2022 5:49 AM   Jerome Kennedy is a 46 y.o. male with past medical history of bipolar, Crohn's disorder, recent admission for C. difficile colitis, prior SBO, COPD, polysubstance abuse presenting with low back pain after lifting a heavy table earlier today.  He was doing some heavy lifting and after he sat down, noticed that he had significant low back pain.  The pain is on the left side and does not radiate down his leg.  It is worse with twisting or trying to bend over.  He denies any midline back pain, leg weakness, or bowel/bladder incontinence    BP 121/87  - Pulse 84  - Temp 36.6 ??C (97.9 ??F) (Oral)  - Resp 16  - SpO2 98%     Medical Decision Making  46 year old male with history as below here for evaluation of left-sided low back pain.  History and exam consistent with a muscular strain.  He has no red flag symptoms including weakness, incontinence, or urinary retention that would be concerning for acute spinal cord pathology.  No midline spinal tenderness concerning for bony injury.  Will provide medication including Valium for muscle relaxation and reassess for improvement    Further ED updates and updates to plan as per ED Course below:    ED Course:     Pain significantly improved after pain medication.  Will discharge with instruction for supportive care including scheduled Tylenol and Motrin, rest and gentle stretching.  Discussed return precautions including severe midline back pain, leg weakness, incontinence, or any other concerns.  All questions answered.  Will discharge.    Independent Interpretation of Studies: I have independently interpreted the following studies:  NA    Discussion of Management With Other Providers or Support Staff: I discussed the management of this patient with the:  None    Considerations Regarding Disposition/Escalation of Care and Critical Care:  Admission not required, patient appropriate for discharge    Social Determinants of Health with Concerns     Financial Resource Strain: Not on file   Internet Connectivity: Not on file   Food Insecurity: Food Insecurity Present (08/29/2019)    Hunger Vital Sign     Worried About Running Out of Food in the Last Year: Sometimes true     Ran Out of Food in the Last Year: Sometimes true   Tobacco Use: Medium Risk (04/07/2022)    Patient History     Smoking Tobacco Use: Former     Smokeless Tobacco Use: Never     Passive Exposure: Not on file   Housing/Utilities: Not on file   Alcohol Use: Not on file   Transportation Needs: Not on file   Substance Use: Not on file   Health Literacy: Not on file   Physical Activity: Not on file   Interpersonal Safety: Not on file   Stress: Not on file   Intimate Partner Violence: Not on file   Depression: Not on file   Social Connections: Not on file     _____________________________________________________________________    The case was discussed with the attending physician who is in agreement with the above assessment and plan    Past History     PAST  MEDICAL HISTORY/PAST SURGICAL HISTORY:   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    Myalgia     SBO (small bowel obstruction) (CMS-HCC) 2011       Past Surgical History:   Procedure Laterality Date    CHOLECYSTECTOMY      COLON SURGERY      partial resection    HERNIA REPAIR      PR CLOSE ENTEROSTOMY,RESEC+COLOREC ANAS Midline 11/01/2019    Procedure: CLO ENTEROSTOMY; W/RESECT COLORECTAL ANASTOM;  Surgeon: Claretta Fraise, MD;  Location: MAIN OR [place];  Service: Gastrointestinal    PR COLONOSCOPY FLX DX W/COLLJ SPEC WHEN PFRMD Left 01/07/2013    Procedure: COLONOSCOPY, FLEXIBLE, PROXIMAL TO SPLENIC FLEXURE; DIAGNOSTIC, W/WO COLLECTION SPECIMEN BY BRUSH OR WASH;  Surgeon: Tish Men, MD;  Location: GI PROCEDURES [facility];  Service: Gastroenterology    PR COLONOSCOPY FLX DX W/COLLJ SPEC WHEN PFRMD  03/09/2015    Procedure: COLONOSCOPY, FLEXIBLE, PROXIMAL TO SPLENIC FLEXURE; DIAGNOSTIC, W/WO COLLECTION SPECIMEN BY BRUSH OR WASH;  Surgeon: Cletis Athens, MD;  Location: GI [facility]URES [facility];  Service: Gastroenterology    PR COLONOSCOPY FLX DX W/COLLJ SPEC WHEN PFRMD N/A 10/28/2019    Procedure: COLONOSCOPY, FLEXIBLE, PROXIMAL TO SPLENIC FLEXURE; DIAGNOSTIC, W/WO COLLECTION SPECIMEN BY BRUSH OR WASH;  Surgeon: Beverly Milch, MD;  Location: GI PROCEDURES [facility];  Service: Gastroenterology    PR EXPLORATORY OF ABDOMEN Midline 09/02/2019    Procedure: EXPLORATORY LAPAROTOMY, EXPLORATORY CELIOTOMY WITH OR WITHOUT BIOPSY(S);  Surgeon: Claretta Fraise, MD;  Location: MAIN OR [facility];  Service: Gastrointestinal    PR EXPLORATORY OF ABDOMEN Midline 11/01/2019    Procedure: EXPLORATORY LAPAROTOMY, EXPLORATORY CELIOTOMY WITH OR WITHOUT BIOPSY(S);  Surgeon: Claretta Fraise, MD;  Location: MAIN OR [facility];  Service: Gastrointestinal    PR FREEING BOWEL ADHESION,ENTEROLYSIS N/A 11/01/2019    Procedure: Enterolysis (Separt Proc);  Surgeon: Claretta Fraise, MD;  Location: MAIN OR [facility];  Service: Gastrointestinal    PR ILEOSCOPY THRU STOMA,BIOPSY N/A 10/14/2019    Procedure: Karren Cobble; [place] Saa 1/MX;  Surgeon: Neysa Hotter, MD;  Location: GI [facility]URES [facility];  Service: Gastroenterology    PR PART REMOVAL COLON W COLOSTOMY Midline 09/02/2019    Procedure: COLECTOMY, PARTIAL; WITH SKIN LEVEL CECOSTOMY OR COLOSTOMY;  Surgeon: Claretta Fraise, MD;  Location: MAIN OR [facility];  Service: Gastrointestinal    PR REPAIR INCISIONAL HERNIA,REDUCIBLE Midline 09/02/2019    Procedure: REPAIR INIT INCISIONAL OR VENTRAL HERNIA; REDUCIBLE;  Surgeon: Kristopher Oppenheim, MD;  Location: MAIN OR [place];  Service: Trauma    PR UPPER GI ENDOSCOPY,BIOPSY N/A 01/07/2013    Procedure: UGI ENDOSCOPY; WITH BIOPSY, SINGLE OR MULTIPLE;  Surgeon: Tish Men, MD;  Location: GI [facility]URES [facility]  Service: Gastroenterology    PR UPPER GI ENDOSCOPY,BIOPSY N/A 10/14/2019    Procedure: UGI ENDOSCOPY; WITH BIOPSY, SINGLE OR MULTIPLE;  Surgeon: Neysa Hotter, MD;  Location: GI PROCEDURES [facility];  Service: Gastroenterology  MEDICATIONS:     Current Facility-Administered Medications:     lidocaine (LIDODERM) 5 % patch 1 patch, 1 patch, Transdermal, Once, Levan Hurst, MD, 1 patch at 04/08/22 0324    Current Outpatient Medications:     famotidine (PEPCID) 20 MG tablet, Take 1 tablet (20 mg total) by mouth two (2) times a day as needed for heartburn., Disp: 60 tablet, Rfl: 0    oxyCODONE (ROXICODONE) 5 MG immediate release tablet, Take 1 tablet (5 mg total) by mouth two (2) times a day as needed for pain., Disp: 5 tablet, Rfl: 0    vancomycin (VANCOCIN) 125 MG capsule, Take 1 capsule (125 mg total) by mouth four (4) times a day for 9 days., Disp: 36 capsule, Rfl: 0    ALLERGIES:   Morphine and Toradol [ketorolac]    SOCIAL HISTORY:   Social History     Tobacco Use    Smoking status: Former     Packs/day: 0.00     Years: 20.00     Additional pack years: 0.00     Total pack years: 0.00     Types: Cigarettes     Quit date: 10/02/2021     Years since quitting: 0.5    Smokeless tobacco: Never    Tobacco comments:     Motivated to quit. Smokes 0.5ppd but relights each cig   Substance Use Topics    Alcohol use: No     Alcohol/week: 0.0 standard drinks of alcohol       FAMILY HISTORY:  Family History   Problem Relation Age of Onset    Diabetes Maternal Aunt     Crohn's disease Neg Hx           Review of Systems     A review of systems was performed and relevant portions were as noted above in HPI     Physical Exam     VITAL SIGNS:    BP 121/87  - Pulse 84  - Temp 36.6 ??C (97.9 ??F) (Oral)  - Resp 16  - SpO2 98%           Constitutional:   Alert and oriented.   Head:   Normocephalic and atraumatic  Eyes:   Conjunctivae are normal, EOMI, PERRL  ENT:   No notable congestion, Mucous membranes moist, External ears normal, no notable stridor  Cardiovascular:   Rate as vitals above. Appears warm and well perfused  Respiratory:   Normal respiratory effort. Breath sounds are normal.  Gastrointestinal:   Soft, non-distended, and nontender.   Genitourinary:   Deferred  Musculoskeletal:    Tenderness to palpation of the left-sided lumbar paraspinal muscles, no midline spinal tenderness to palpation.  Otherwise normal range of motion in all extremities. No tenderness or edema noted in B/L lower extremities  Neurologic:   No gross focal neurologic deficits beyond baseline are appreciated.  Skin:   Skin is warm, dry and intact.       Radiology     No orders to display       Labs     Labs Reviewed - No data to display      Pertinent labs & imaging results that were available during my care of the patient were reviewed by me and considered in my medical decision making (see chart for details).    Please note- This chart has been created using [employer]. Chart creation errors have been sought, but may not always be located and such creation errors,  especially pronoun confusion, do NOT reflect on the standard of medical care.       Raechel Ache, MD  Resident  04/08/22 [phone]

## 2022-04-08 NOTE — Unmapped (Signed)
Per Dr. Stevphen Rochester and Dr. [name] need to coordinate Humira refills outpatient for Jerome Kennedy, due again week of 04/14/2022.  [facility] will [facility] [place] since he is currently homeless.  [facility] notified with request to assist with processing and fill.

## 2022-04-08 NOTE — Unmapped (Signed)
Pt here with nontraumatic lower back pain, seen earlier today for same.

## 2022-04-09 MED ORDER — EMPTY CONTAINER
3 refills | 0 days
Start: 2022-04-09 — End: ?

## 2022-04-09 NOTE — Unmapped (Unsigned)
***Incomplete onboarding - prepopulating information - pt would like a return phone call on 9/7[facility]   Patient Onboarding/Medication Counseling    *** Per Dr. Stevphen Rochester -  [name] plans to [facility] himself into the [facility] in [place] ([facility] on Tuesday (9/5).  He tells me that the [facility] shelter does permit meds to be shipped to him there.       [name] is a 46 y.o. male with Crohn's who I am counseling today on continuation of therapy.  I am speaking to {Blank:19197::the patient,the patient's caregiver, ***,the patient's family member, ***,***}.    Was a Nurse, learning disability used for this call? No    Verified patient's date of birth / HIPAA.    Specialty medication(s) to be sent: Inflammatory Disorders: Humira      Non-specialty medications/supplies to be sent: sharps?      Medications not needed at this time: n/a         Humira (adalimumab)    Medication & Administration     Dosage:  Crohn's: Inject 1 pen (40mg /0.75mL) under the skin every 14 days    Lab tests required prior to treatment initiation:  Tuberculosis: Tuberculosis screening resulted in a non-reactive Quantiferon TB Gold assay.(Completed: 02/01/21)  Hepatitis B: Hepatitis B serology studies are complete and non-reactive.(Completed: 02/01/21)    Administration:     Prefilled auto-injector pen  1. Gather all supplies needed for injection on a clean, flat working surface: medication pen removed from packaging, alcohol swab, sharps container, etc.  2. Look at the medication label - look for correct medication, correct dose, and check the expiration date  3. Look at the medication - the liquid visible in the window on the side of the pen device should appear clear and colorless  4. Lay the auto-injector pen on a flat surface and allow it to warm up to room temperature for at least 30-45 minutes  5. Select injection site - you can use the front of your thigh or your belly (but not the area 2 inches around your belly button); if someone else is giving you the injection you can also use your upper arm in the skin covering your triceps muscle  6. Prepare injection site - wash your hands and clean the skin at the injection site with an alcohol swab and let it air dry, do not touch the injection site again before the injection  7. Pull the 2 safety caps straight off - gray/white to uncover the needle cover and the plum cap to uncover the plum activator button, do not remove until immediately prior to injection and do not touch the white needle cover  8. Gently squeeze the area of cleaned skin and hold it firmly to create a firm surface at the selected injection site  9. Put the white needle cover against your skin at the injection site at a 90 degree angle, hold the pen such that you can see the clear medication window  10. Press down and hold the pen firmly against your skin, press the plum activator button to initiate the injection, there will be a click when the injection starts  11. Continue to hold the pen firmly against your skin for about 10-15 seconds - the window will start to turn solid yellow  12. To verify the injection is complete after 10-15 seconds, look and ensure the window is solid yellow and then pull the pen away from your skin  13.  Dispose of the used auto-injector pen immediately in your sharps disposal container the needle will be covered automatically  14. If you see any blood at the injection site, press a cotton ball or gauze on the site and maintain pressure until the bleeding stops, do not rub the injection site    Adherence/Missed dose instructions:  If your injection is given more than 3 days after your scheduled injection date - consult your pharmacist for additional instructions on how to adjust your dosing schedule.    Goals of Therapy     - Achieve remission of symptoms  - Maintain remission of symptoms  - Minimize long-term systemic glucocorticoid use  - Prevent need for surgical procedures  - Maintenance of effective psychosocial functioning    Side Effects & Monitoring Parameters     Injection site reaction (redness, irritation, inflammation localized to the site of administration)  Signs of a common cold - minor sore throat, runny or stuffy nose, etc.  Upset stomach  Headache    The following side effects should be reported to the provider:  Signs of a hypersensitivity reaction - rash; hives; itching; red, swollen, blistered, or peeling skin; wheezing; tightness in the chest or throat; difficulty breathing, swallowing, or talking; swelling of the mouth, face, lips, tongue, or throat; etc.  Reduced immune function - report signs of infection such as fever; chills; body aches; very bad sore throat; ear or sinus pain; cough; more sputum or change in color of sputum; pain with passing urine; wound that will not heal, etc.  Also at a slightly higher risk of some malignancies (mainly skin and blood cancers) due to this reduced immune function.  In the case of signs of infection - the patient should hold the next dose of Humira?? and call your primary care provider to ensure adequate medical care.  Treatment may be resumed when infection is treated and patient is asymptomatic.  Changes in skin - a new growth or lump that forms; changes in shape, size, or color of a previous mole or marking  Signs of unexplained bruising or bleeding - throwing up blood or emesis that looks like coffee grounds; black, tarry, or bloody stool; etc.  Signs of new or worsening heart failure - shortness of breath; sudden weight gain; heartbeat that is not normal; swelling in the arms or legs that is new or worse      Contraindications, Warnings, & Precautions     Have your bloodwork checked as you have been told by your prescriber  Talk with your doctor if you are pregnant, planning to become pregnant, or breastfeeding  Discuss the possible need for holding your dose(s) of Humira?? when a planned procedure is scheduled with the prescriber as it may delay healing/recovery timeline       Drug/Food Interactions     Medication list reviewed in Epic. The patient was instructed to inform the care team before taking any new medications or supplements. No drug interactions identified.   Talk with you prescriber or pharmacist before receiving any live vaccinations while taking this medication and after you stop taking it    Storage, Handling Precautions, & Disposal     Store this medication in the refrigerator.  Do not freeze  If needed, you may store at room temperature for up to 14 days  Store in original packaging, protected from light  Do not shake  Dispose of used syringes/pens in a sharps disposal container  Current Medications (including OTC/herbals), Comorbidities and Allergies     Current Outpatient Medications   Medication Sig Dispense Refill    famotidine (PEPCID) 20 MG tablet Take 1 tablet (20 mg total) by mouth two (2) times a day as needed for heartburn. 60 tablet 0    HUMIRA PEN CITRATE FREE 40 MG/0.4 ML Inject the contents of 1 pen (40 mg total) under the skin every fourteen (14) days. 2 each 5    oxyCODONE (ROXICODONE) 5 MG immediate release tablet Take 1 tablet (5 mg total) by mouth two (2) times a day as needed for pain. 5 tablet 0    vancomycin (VANCOCIN) 125 MG capsule Take 1 capsule (125 mg total) by mouth four (4) times a day for 9 days. 36 capsule 0     No current facility-administered medications for this visit.       Allergies   Allergen Reactions    Morphine Itching and Rash    Toradol [Ketorolac] Itching       Patient Active Problem List   Diagnosis    Crohn's disease (CMS-HCC)    Tobacco use disorder, moderate, in controlled environment, dependence    Exacerbation of Crohn's disease (CMS-HCC)    Abdominal pain    Nausea & vomiting    Loose stools    Anemia    Hypoalbuminemia    Asymptomatic microscopic hematuria    C. difficile colitis       Reviewed and up to date in Epic.    Appropriateness of Therapy Acute infections noted within Epic:  C Difficile  Patient reported infection: {Blank single:19197::None,***- patient reported to provider,***- pharmacy reported to provider}    Is medication and dose appropriate based on diagnosis and infection status? Yes    Prescription has been clinically reviewed: Yes      Baseline Quality of Life Assessment      How many days over the past month did your Crohn's  keep you from your normal activities? For example, brushing your teeth or getting up in the morning. {Blank:19197::***,0,Patient declined to answer}    Financial Information     Medication Assistance provided: None Required    Anticipated copay of $4/28ds reviewed with patient. Verified delivery address.    Delivery Information     Scheduled delivery date: ***    Expected start date: ***    Medication will be delivered via {Blank:19197::UPS,Next Day Courier,Same Day Courier,Clinic Courier - *** clinic,***} to the {Blank:19197::prescription,temporary} address in Epic WAM.  This shipment will not require a signature.  *** Pt is currently homeless so please verify delivery address ***    Explained the services we provide at [facility] and that each month we would call to set up refills.  Stressed importance of returning phone calls so that we could ensure they receive their medications in time each month.  Informed patient that we should be setting up refills 7-10 days prior to when they will run out of medication.  A pharmacist will reach out to perform a clinical assessment periodically.  Informed patient that a welcome packet, containing information about our pharmacy and other support services, a Notice of Privacy Practices, and a drug information handout will be sent.      The patient or caregiver noted above participated in the development of this care plan and knows that they can request review of or adjustments to the care plan at any time.      Patient or caregiver verbalized understanding of  the above information as well as how to contact the pharmacy at [phone] option 4 with any questions/concerns.  The pharmacy is open Monday through Friday 8:30am-4:30pm.  A pharmacist is available 24/7 via pager to answer any clinical questions they may have.    Patient Specific Needs     Does the patient have any physical, cognitive, or cultural barriers? {Blank single:19197::No,Yes - ***}    Does the patient have adequate living arrangements? (i.e. the ability to store and take their medication appropriately) {Blank single:19197::Yes,No - ***}    Did you identify any home environmental safety or security hazards? {Blank single:19197::No,Yes - ***}    Patient prefers to have medications discussed with  {Blank single:19197::Patient,Family Member,Caregiver,Other}     Is the patient or caregiver able to read and understand education materials at a high school level or above? {Blank single:19197::No,Yes}    Patient's primary language is  {Blank single:19197::English,Spanish,***}     Is the patient high risk? {sschighriskpts:78327}    SOCIAL DETERMINANTS OF HEALTH     At the [facility], we have learned that life circumstances - like trouble affording food, housing, utilities, or transportation can affect the health of many of our patients.   That is why we wanted to ask: are you currently experiencing any life circumstances that are negatively impacting your health and/or quality of life? {YES/NO/PATIENTDECLINED:93004}    Social Determinants of Health     Financial Resource Strain: Not on file   Internet Connectivity: Not on file   Food Insecurity: Food Insecurity Present (08/29/2019)    Hunger Vital Sign     Worried About Running Out of Food in the Last Year: Sometimes true     Ran Out of Food in the Last Year: Sometimes true   Tobacco Use: Medium Risk (04/07/2022)    Patient History     Smoking Tobacco Use: Former     Smokeless Tobacco Use: Never     Passive Exposure: Not on file   Housing/Utilities: Not on file   Alcohol Use: Not on file   Transportation Needs: Not on file   Substance Use: Not on file   Health Literacy: Not on file   Physical Activity: Not on file   Interpersonal Safety: Not on file   Stress: Not on file   Intimate Partner Violence: Not on file   Depression: Not on file   Social Connections: Not on file       Would you be willing to receive help with any of the needs that you have identified today? {Yes/No/Not applicable:93005}       Teofilo Pod  [facility] Specialty Pharmacist

## 2022-04-10 NOTE — Unmapped (Signed)
Per [facility], patient will be receiving delivery [facility] Humira 9/[facility].   Follow up with Dr. Wilson Singer later this month.

## 2022-04-11 MED FILL — EMPTY CONTAINER: 120 days supply | Qty: 1 | Fill #0

## 2022-04-15 LAB — BASIC METABOLIC PANEL
ANION GAP: 7 mmol/L (ref 5–14)
BLOOD UREA NITROGEN: 9 mg/dL (ref 9–23)
BUN / CREAT RATIO: 10
CALCIUM: 8.1 mg/dL — ABNORMAL LOW (ref 8.7–10.4)
CHLORIDE: 113 mmol/L — ABNORMAL HIGH (ref 98–107)
CO2: 23 mmol/L (ref 20.0–31.0)
CREATININE: 0.87 mg/dL
EGFR CKD-EPI (2021) MALE: >90 mL/min/{1.73_m2} (ref >=60)
GLUCOSE RANDOM: 73 mg/dL (ref 70–179)
POTASSIUM: 3.5 mmol/L (ref 3.4–4.8)
SODIUM: 143 mmol/L (ref 135–145)

## 2022-04-15 LAB — HEPATIC FUNCTION PANEL
ALBUMIN: 2.6 g/dL — ABNORMAL LOW (ref 3.4–5.0)
ALKALINE PHOSPHATASE: 149 U/L — ABNORMAL HIGH (ref 46–116)
ALT (SGPT): 31 U/L (ref 10–49)
AST (SGOT): 15 U/L (ref <=34)
BILIRUBIN DIRECT: 0.1 mg/dL (ref 0.00–0.30)
BILIRUBIN TOTAL: 0.3 mg/dL (ref 0.3–1.2)
PROTEIN TOTAL: 6.2 g/dL (ref 5.7–8.2)

## 2022-04-15 LAB — CBC W/ AUTO DIFF
BASOPHILS ABSOLUTE COUNT: 0 10*9/L (ref 0.0–0.1)
BASOPHILS RELATIVE PERCENT: 0.4 %
EOSINOPHILS ABSOLUTE COUNT: 0.1 10*9/L (ref 0.0–0.5)
EOSINOPHILS RELATIVE PERCENT: 0.9 %
HEMATOCRIT: 33.7 % — ABNORMAL LOW (ref 39.0–48.0)
HEMOGLOBIN: 11 g/dL — ABNORMAL LOW (ref 12.9–16.5)
LYMPHOCYTES ABSOLUTE COUNT: 1.4 10*9/L (ref 1.1–3.6)
LYMPHOCYTES RELATIVE PERCENT: 16.7 %
MEAN CORPUSCULAR HEMOGLOBIN CONC: 32.6 g/dL (ref 32.0–36.0)
MEAN CORPUSCULAR HEMOGLOBIN: 26.9 pg (ref 25.9–32.4)
MEAN CORPUSCULAR VOLUME: 82.5 fL (ref 77.6–95.7)
MEAN PLATELET VOLUME: 7.6 fL (ref 6.8–10.7)
MONOCYTES ABSOLUTE COUNT: 0.7 10*9/L (ref 0.3–0.8)
MONOCYTES RELATIVE PERCENT: 8.7 %
NEUTROPHILS ABSOLUTE COUNT: 5.9 10*9/L (ref 1.8–7.8)
NEUTROPHILS RELATIVE PERCENT: 73.3 %
PLATELET COUNT: 323 10*9/L (ref 150–450)
RED BLOOD CELL COUNT: 4.09 10*12/L — ABNORMAL LOW (ref 4.26–5.60)
RED CELL DISTRIBUTION WIDTH: 17.8 % — ABNORMAL HIGH (ref 12.2–15.2)
WBC ADJUSTED: 8.1 10*9/L (ref 3.6–11.2)

## 2022-04-15 LAB — LACTATE DEHYDROGENASE: LACTATE DEHYDROGENASE: 183 U/L (ref 120–246)

## 2022-04-15 LAB — LIPASE: LIPASE: 31 U/L (ref 12–53)

## 2022-04-16 ENCOUNTER — Ambulatory Visit: Admit: 2022-04-16 | Discharge: 2022-04-16 | Disposition: A | Discharge status: Home / Self Care | Payer: MEDICAID

## 2022-04-16 LAB — SEDIMENTATION RATE: ERYTHROCYTE SEDIMENTATION RATE: 59 mm/h — ABNORMAL HIGH (ref 0–15)

## 2022-04-16 LAB — C-REACTIVE PROTEIN: C-REACTIVE PROTEIN: 65 mg/L — ABNORMAL HIGH (ref <=10.0)

## 2022-04-16 NOTE — Unmapped (Signed)
[facility] Emergency Department Provider Note         ED Clinical Impression     Final diagnoses:   Abdominal pain, unspecified abdominal location (Primary)       Presenting History and MDM     HPI    April 16, 2022 4:33 AM   Jerome Kennedy is a 46 y.o. male who  has a past medical history of Anemia (04/05/2022), Anxiety, Avascular necrosis of femur head, right (CMS-HCC) (2011), Bipolar disorder (CMS-HCC), Cannabis use disorder, COPD (chronic obstructive pulmonary disease) (CMS-HCC), Crohn's disease (CMS-HCC), Depression (2007), DVT (deep venous thrombosis) (CMS-HCC), Myalgia, and SBO (small bowel obstruction) (CMS-HCC) (2011). presenting with abdominal pain as well as nausea and vomiting and concern for Crohn's flare.  Patient has history of Crohn's as well as multiple abdominal surgeries.  He said he vomited blood at approximately 5 PM today.  He reports he has had no episodes of vomiting since being in the ED but does have significant abdominal pain.  He does not report any chest pain or shortness of breath.  He is not reporting any fevers or chills at this time.  No headache or blurry vision.  Patient said he vomited streaks of blood there was not large-volume bleeding.  No coffee-ground emesis but rather patient said it was bright red.    Initial Ddx: Ddx in this complex case includes Crohn's flare, SBO, pancreatitis, PUD, Biliary disease (including cholelithiasis, cholecystitis, choledocolithiasis, ascending cholangitis), hepatitis, diverticulitis, renal colic, nephrolithiasis, IBD, MI, Bowel ischemia, appendicitis, AAA, Aortic dissection, IBS, DKA, gastritis, gastroenteritis, testicular disease (torsion, cysts, cancers), large bowel obstruction, hernia, peritonitis, SBP, pyelonephritis, SMA syndrome.     Initial impression and pertinent physical exam:      BP 101/82  - Pulse 81  - Temp 36.6 ??C (97.9 ??F) (Oral)  - Resp 16  - SpO2 100%     On initial evaluation patient is in no acute distress. Patient has abdominal hernia which is mildly tender.  No evidence of skin changes suggesting strangulation. Cardiac and respiratory exam unremarkable.  No evidence of volume overload or peripheral edema.    Diagnostic workup as below.     Orders Placed This Encounter   Procedures    CBC w/ Differential    Basic Metabolic Panel    Hepatic Function Panel    Lipase Level    Urinalysis with Microscopy with Culture Reflex    Toxicology Screen, Urine    LDH, Lactate dehydrogenase    C-reactive protein    Sedimentation rate, manual    Insert peripheral IV       Will reassess as we get results and update below    ED Course as of 04/16/22 0546   Wed Apr 16, 2022   0522 CRP(!): 65.0   0541 Sed Rate(!): 59         MDM: Given patient's history, presentation, physical exam, abdominal exam without peritoneal signs. No evidence of acute abdomen at this time. Well appearing. Given work up, low suspicion for acute hepatobiliary disease (including acute cholecystitis or cholangitis), acute pancreatitis (neg lipase), PUD (including gastric perforation), acute infectious processes (pneumonia, hepatitis, pyelonephritis), acute appendicitis, vascular catastrophe, bowel obstruction, viscus perforation, or testicular torsion, diverticulitis. Presentation not consistent with other acute, emergent causes of abdominal pain at this time, including those listed in the differential above.  At this time believe is most likely patient may be having a mild exacerbation of his Crohn's.  Patient with mild elevation of CRP and  ESR for his labs 11 days ago.  Discussed with patient that I believe he is likely have exacerbation of his Crohn's as well as possibility of a CT of his abdomen pelvis.  Patient declined and said he would like to attempt p.o. challenge.  Patient has hematuria in the ED and was able to tolerate p.o.  He reports he will follow-up outpatient or return if things get worse.  At this time patient is in stable condition.  Patient discharged home with strict return precautions and instruction outpatient follow-up.        Discussion of Management with other Physicians, QHP, or Appropriate Source: None  External Records Reviewed: Reviewed patient's progress note from 04/09/2022.  Escalation of Care, Consideration of Admission/Observation/Transfer: Admission not required. Appropriate for outpatient management.  Social determinants that significantly affected care: History of substance abuse.  Prescription drug(s) considered but not prescribed: Consider prescribing 600 mg ibuprofen Q6 for pain.  However after shared decision making did not prescribe.  Patient will take home meds.  Diagnostic tests considered but not performed: Patient's physical exam with no evidence of acute abdomen.  No peritoneal signs.  Overall I have low suspicion for any emergent intra-abdominal pathology.  CT scan of the abdomen pelvis not indicated at this time.        _____________________________________________________________________    The case was discussed with attending physician who is in agreement with the above assessment and plan    Additional Medical Decision Making     I have reviewed the vital signs and the nursing notes. Labs and radiology results that were available during my care of the patient were independently reviewed by me and considered in my medical decision making.   I independently visualized the EKG tracing if performed  I independently visualized the radiology images if performed  I reviewed the patient's prior medical records if available.  Additional history obtained from family if available    Other History     CHIEF COMPLAINT:   Chief Complaint   Patient presents with    Abdominal Pain       PAST MEDICAL HISTORY/PAST SURGICAL HISTORY:   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    Myalgia     SBO (small bowel obstruction) (CMS-HCC) 2011       Past Surgical History:   Procedure Laterality Date    CHOLECYSTECTOMY      COLON SURGERY      partial resection    HERNIA REPAIR      PR CLOSE ENTEROSTOMY,RESEC+COLOREC ANAS Midline 11/01/2019    Procedure: CLO ENTEROSTOMY; W/RESECT COLORECTAL ANASTOM;  Surgeon: Claretta Fraise, MD;  Location: MAIN OR [place];  Service: Gastrointestinal    PR COLONOSCOPY FLX DX W/COLLJ SPEC WHEN PFRMD Left 01/07/2013    Procedure: COLONOSCOPY, FLEXIBLE, PROXIMAL TO SPLENIC FLEXURE; DIAGNOSTIC, W/WO COLLECTION SPECIMEN BY BRUSH OR WASH;  Surgeon: Tish Men, MD;  Location: [facility];  Service: Gastroenterology    PR COLONOSCOPY FLX DX  W/COLLJ SPEC WHEN PFRMD  03/09/2015    Procedure: COLONOSCOPY, FLEXIBLE, PROXIMAL TO SPLENIC FLEXURE; DIAGNOSTIC, W/WO COLLECTION SPECIMEN BY BRUSH OR WASH;  Surgeon: Cletis Athens, MD;  Location: GI [facility]URES [facility];  Service: Gastroenterology    PR COLONOSCOPY FLX DX W/COLLJ SPEC WHEN PFRMD N/A 10/28/2019    Procedure: COLONOSCOPY, FLEXIBLE, PROXIMAL TO SPLENIC FLEXURE; DIAGNOSTIC, W/WO COLLECTION SPECIMEN BY BRUSH OR WASH;  Surgeon: Beverly Milch, MD;  Location: [facility];  Service: Gastroenterology    PR EXPLORATORY OF ABDOMEN Midline 09/02/2019    Procedure: EXPLORATORY LAPAROTOMY, EXPLORATORY CELIOTOMY WITH OR WITHOUT BIOPSY(S);  Surgeon: Claretta Fraise, MD;  Location: MAIN OR [facility];  Service: Gastrointestinal    PR EXPLORATORY OF ABDOMEN Midline 11/01/2019    Procedure: EXPLORATORY LAPAROTOMY, EXPLORATORY CELIOTOMY WITH OR WITHOUT BIOPSY(S);  Surgeon: Claretta Fraise, MD;  Location: MAIN OR [facility];  Service: Gastrointestinal PR FREEING BOWEL ADHESION,ENTEROLYSIS N/A 11/01/2019    Procedure: Enterolysis (Separt Proc);  Surgeon: Claretta Fraise, MD;  Location: MAIN OR [facility];  Service: Gastrointestinal    PR ILEOSCOPY THRU STOMA,BIOPSY N/A 10/14/2019    Procedure: Karren Cobble; [place] Saa 1/MX;  Surgeon: Neysa Hotter, MD;  Location: [facility];  Service: Gastroenterology    PR PART REMOVAL COLON W COLOSTOMY Midline 09/02/2019    Procedure: COLECTOMY, PARTIAL; WITH SKIN LEVEL CECOSTOMY OR COLOSTOMY;  Surgeon: Claretta Fraise, MD;  Location: MAIN OR [facility];  Service: Gastrointestinal    PR REPAIR INCISIONAL HERNIA,REDUCIBLE Midline 09/02/2019    Procedure: REPAIR INIT INCISIONAL OR VENTRAL HERNIA; REDUCIBLE;  Surgeon: Kristopher Oppenheim, MD;  Location: MAIN OR [place];  Service: Trauma    PR UPPER GI ENDOSCOPY,BIOPSY N/A 01/07/2013    Procedure: UGI ENDOSCOPY; WITH BIOPSY, SINGLE OR MULTIPLE;  Surgeon: Tish Men, MD;  Location: GI [facility]URES [facility];  Service: Gastroenterology    PR UPPER GI ENDOSCOPY,BIOPSY N/A 10/14/2019    Procedure: UGI ENDOSCOPY; WITH BIOPSY, SINGLE OR MULTIPLE;  Surgeon: Neysa Hotter, MD;  Location: GI [facility]URES [facility];  Service: Gastroenterology       MEDICATIONS:     Current Facility-Administered Medications:     acetaminophen (OFIRMEV) 10 mg/mL injection 1,000 mg, 1,000 mg, Intravenous, Q6H PRN, Aquilla Solian, MD    acetaminophen (TYLENOL) tablet 1,000 mg, 1,000 mg, Oral, Once, Jocelyn Lamer, MD    ondansetron (ZOFRAN-ODT) disintegrating tablet 4 mg, 4 mg, Oral, Once, Jocelyn Lamer, MD    Current Outpatient Medications:     empty container Misc, Use as directed to dispose of Humira, Disp: 1 each, Rfl: 3    famotidine (PEPCID) 20 MG tablet, Take 1 tablet (20 mg total) by mouth two (2) times a day as needed for heartburn., Disp: 60 tablet, Rfl: 0    HUMIRA PEN CITRATE FREE 40 MG/0.4 ML, Inject the contents of 1 pen (40 mg total) under the skin every fourteen (14) days., Disp: 2 each, Rfl: 5    oxyCODONE (ROXICODONE) 5 MG immediate release tablet, Take 1 tablet (5 mg total) by mouth two (2) times a day as needed for pain., Disp: 5 tablet, Rfl: 0    vancomycin (VANCOCIN) 125 MG capsule, Take 1 capsule (125 mg total) by mouth four (4) times a day for 9 days., Disp: 36 capsule, Rfl: 0    ALLERGIES:   Morphine and Toradol [ketorolac]    SOCIAL HISTORY:   Social History     Tobacco Use    Smoking status: Former  Packs/day: 0.00     Years: 20.00     Additional pack years: 0.00     Total pack years: 0.00     Types: Cigarettes     Quit date: 10/02/2021     Years since quitting: 0.5    Smokeless tobacco: Never    Tobacco comments:     Motivated to quit. Smokes 0.5ppd but relights each cig   Substance Use Topics    Alcohol use: No     Alcohol/week: 0.0 standard drinks of alcohol       FAMILY HISTORY:  Family History   Problem Relation Age of Onset    Diabetes Maternal Aunt     Crohn's disease Neg Hx           Review of Systems    A 10 point review of systems was performed and is negative other than positive elements noted in HPI     Physical Exam     Constitutional: Alert and oriented. No acute distress.  Eyes: Conjunctivae are normal.  HENT: Normocephalic and atraumatic. No congestion. Moist mucous membranes.   Cardiovascular: Rate as above, regular rhythm. Normal and symmetric distal pulses. Brisk capillary refill. Normal skin turgor.  Respiratory: Normal respiratory effort. Breath sounds are normal. There are no wheezing or crackles heard.  Gastrointestinal: Patient has abdominal hernia which is mildly tender.  No evidence of skin changes suggesting strangulation.  No peritonitic signs or evidence of acute abdomen.  No specific quadrant tenderness.  Genitourinary: Deferred.  Musculoskeletal: Non-tender with normal range of motion in all extremities.  Neurologic: Normal speech and language. No gross focal neurologic deficits are appreciated. Patient is moving all extremities equally, face is symmetric at rest and with speech.  Skin: Skin is warm, dry and intact. No rash noted.  Psychiatric: Mood and affect are normal. Speech and behavior are normal.    Radiology     No orders to display       Labs     Labs Reviewed   BASIC METABOLIC PANEL - Abnormal; Notable for the following components:       Result Value    Chloride 113 (*)     Calcium 8.1 (*)     All other components within normal limits   HEPATIC FUNCTION PANEL - Abnormal; Notable for the following components:    Albumin 2.6 (*)     Alkaline Phosphatase 149 (*)     All other components within normal limits   C-REACTIVE PROTEIN - Abnormal; Notable for the following components:    CRP 65.0 (*)     All other components within normal limits   SEDIMENTATION RATE - Abnormal; Notable for the following components:    Sed Rate 59 (*)     All other components within normal limits   CBC W/ AUTO DIFF - Abnormal; Notable for the following components:    RBC 4.09 (*)     HGB 11.0 (*)     HCT 33.7 (*)     RDW 17.8 (*)     Anisocytosis Slight (*)     Hypochromasia Slight (*)     All other components within normal limits   LIPASE - Normal   LACTATE DEHYDROGENASE - Normal   CBC W/ DIFFERENTIAL    Narrative:     The following orders were created for panel order CBC w/ Differential.                  Procedure  Abnormality         Status                                     ---------                               -----------         ------                                     CBC w/ Differential[[id number]]         Abnormal            Final result                                                 Please view results for these tests on the individual orders.   URINALYSIS WITH MICROSCOPY WITH CULTURE REFLEX   TOXICOLOGY SCREEN, URINE       Please note- This chart has been created using [employer]. Chart creation errors have been sought, but may not always be located and such creation errors, especially pronoun confusion, do NOT reflect on the standard of medical care.     Aquilla Solian, MD  Resident  04/16/22 [phone]

## 2022-04-16 NOTE — Unmapped (Signed)
Pt arrives via EMS for C/C of lower abd pain and N/V, pt endorse vomiting blood starting approx 1700 today.

## 2022-04-16 NOTE — Unmapped (Signed)
Pt has refused PO pain and nausea meds, refused CT scans, and refused IV tylenol. Demanding Dilaudid and phenergan and a sandwich.

## 2022-04-16 NOTE — Unmapped (Signed)
Patient in via [facility] for crohn's flare.  Patient c/o abdominal pain, NVD.

## 2022-04-18 ENCOUNTER — Ambulatory Visit: Admit: 2022-04-18 | Discharge: 2022-04-18 | Disposition: A | Discharge status: Home / Self Care | Payer: MEDICAID

## 2022-04-18 DIAGNOSIS — G43809 Other migraine, not intractable, without status migrainosus: Principal | ICD-10-CM

## 2022-04-18 DIAGNOSIS — K219 Gastro-esophageal reflux disease without esophagitis: Principal | ICD-10-CM

## 2022-04-18 LAB — CBC W/ AUTO DIFF
BASOPHILS ABSOLUTE COUNT: 0 10*9/L (ref 0.0–0.1)
BASOPHILS RELATIVE PERCENT: 0.5 %
EOSINOPHILS ABSOLUTE COUNT: 0 10*9/L (ref 0.0–0.5)
EOSINOPHILS RELATIVE PERCENT: 0.9 %
HEMATOCRIT: 32 % — ABNORMAL LOW (ref 39.0–48.0)
HEMOGLOBIN: 10.3 g/dL — ABNORMAL LOW (ref 12.9–16.5)
LYMPHOCYTES ABSOLUTE COUNT: 1.1 10*9/L (ref 1.1–3.6)
LYMPHOCYTES RELATIVE PERCENT: 20.1 %
MEAN CORPUSCULAR HEMOGLOBIN CONC: 32.3 g/dL (ref 32.0–36.0)
MEAN CORPUSCULAR HEMOGLOBIN: 27 pg (ref 25.9–32.4)
MEAN CORPUSCULAR VOLUME: 83.6 fL (ref 77.6–95.7)
MEAN PLATELET VOLUME: 7.3 fL (ref 6.8–10.7)
MONOCYTES ABSOLUTE COUNT: 0.5 10*9/L (ref 0.3–0.8)
MONOCYTES RELATIVE PERCENT: 9.2 %
NEUTROPHILS ABSOLUTE COUNT: 3.8 10*9/L (ref 1.8–7.8)
NEUTROPHILS RELATIVE PERCENT: 69.3 %
PLATELET COUNT: 310 10*9/L (ref 150–450)
RED BLOOD CELL COUNT: 3.83 10*12/L — ABNORMAL LOW (ref 4.26–5.60)
RED CELL DISTRIBUTION WIDTH: 17.6 % — ABNORMAL HIGH (ref 12.2–15.2)
WBC ADJUSTED: 5.5 10*9/L (ref 3.6–11.2)

## 2022-04-18 LAB — COMPREHENSIVE METABOLIC PANEL
ALBUMIN: 2.2 g/dL — ABNORMAL LOW (ref 3.4–5.0)
ALKALINE PHOSPHATASE: 118 U/L — ABNORMAL HIGH (ref 46–116)
ALT (SGPT): 17 U/L (ref 10–49)
ANION GAP: 7 mmol/L (ref 5–14)
AST (SGOT): 13 U/L (ref <=34)
BILIRUBIN TOTAL: 0.4 mg/dL (ref 0.3–1.2)
BLOOD UREA NITROGEN: 8 mg/dL — ABNORMAL LOW (ref 9–23)
BUN / CREAT RATIO: 11
CALCIUM: 7.7 mg/dL — ABNORMAL LOW (ref 8.7–10.4)
CHLORIDE: 116 mmol/L — ABNORMAL HIGH (ref 98–107)
CO2: 19 mmol/L — ABNORMAL LOW (ref 20.0–31.0)
CREATININE: 0.75 mg/dL
EGFR CKD-EPI (2021) MALE: >90 mL/min/{1.73_m2} (ref >=60)
GLUCOSE RANDOM: 92 mg/dL (ref 70–179)
POTASSIUM: 3.6 mmol/L (ref 3.4–4.8)
PROTEIN TOTAL: 5.3 g/dL — ABNORMAL LOW (ref 5.7–8.2)
SODIUM: 142 mmol/L (ref 135–145)

## 2022-04-18 LAB — HIGH SENSITIVITY TROPONIN I - SINGLE: HIGH SENSITIVITY TROPONIN I: <3 ng/L (ref <=53)

## 2022-04-18 LAB — LIPASE: LIPASE: 52 U/L (ref 12–53)

## 2022-04-18 MED ADMIN — acetaminophen (TYLENOL) tablet 1,000 mg: 1000 mg | ORAL | @ 15:00:00 | Stop: 2022-04-18

## 2022-04-18 MED ADMIN — famotidine (PEPCID) tablet 20 mg: 20 mg | ORAL | @ 15:00:00 | Stop: 2022-04-18

## 2022-04-18 MED ADMIN — prochlorperazine (COMPAZINE) injection 10 mg: 10 mg | INTRAMUSCULAR | @ 15:00:00 | Stop: 2022-04-18

## 2022-04-18 NOTE — Unmapped (Signed)
[facility] Emergency Department Provider Note      ED Clinical Impression     Final diagnoses:   Other migraine without status migrainosus, not intractable (Primary)   Gastroesophageal reflux disease without esophagitis       HPI, ED Course, Assessment and Plan     Initial Clinical Impression:    April 18, 2022 10:05 AM     Kongmeng Santoro is a 46 y.o. male with past medical history of anemia, avascular necrosis of the femur, bipolar, COPD, Crohn's disease, depression, DVT not on anticoagulation, SBO who presents for migraine that began this morning.  Patient reports that he began experiencing blurry vision this morning followed by gradual onset of headache which he localizes to the frontal aspect.  He states this feels similar to other migraines.  He attempted to take ibuprofen without relief.  He states headache gradually got worse with associated photophobia and sonophobia and nausea.  No vomiting.  Denies any recent head injury or trauma.  No new weakness numbness in extremities but states has had ongoing 3 weeks of numbness in his left arm which she is got worked up at other providers without clear diagnosis.  He also reports during this time having increased reflux which he localizes chest pain to the substernal region.  Denies any associated shortness of breath or cough.  He denies any fevers or neck pain.  Denies any diarrhea, bloody stools, or dysuria.    BP 97/71  - Pulse 99  - Temp 37.1 ??C (98.7 ??F) (Oral)  - Resp 18  - SpO2 100%     Medical Decision Making    On exam, patient is well-appearing in no acute distress.  Vitals notable for soft blood pressure 97/71 but on review of priors including prior ED note from 04/16/2022 patient also saw blood pressure to 101/87.  No associated tachycardia.  Cardiopulmonary exam is reassuring.  Abdomen is soft, nondistended, and nontender to palpation.  Detailed neuro exam without any focal deficits.  Patient endorses subjective mildly decreased sensation in the left upper extremity compared to the right but still can feel light touch and states that this has been ongoing for the past 3 weeks and not associated or worsened by headache.  He has full range of motion of the neck without any evidence of meningismus and in the setting of no fever have low suspicion for meningitis.  Patient states headache is similar to other migraines and has been was gradual in onset with low suspicion for SDH.  No head trauma concerning for ICH.  With regards to chest pain, patient attributes the symptoms to reflux and states it feels like similar reflux but has worsened this morning.  Will obtain ACS work-up.  Plan to administer migraine cocktail and reassess.    Further ED updates and updates to plan as per ED Course below:    ED Course:  ED Course as of 04/20/22 2305   [facility] Apr 18, 2022   1011 EKG shows normal sinus rhythm at a rate of 90 with normal intervals.  Normal axis.  No ST changes consistent with infarct or ischemia.   1102 CBC without leukocytosis.  Baseline he anemia with hemoglobin of 10.3.  Patient did not want IV at this time and requested oral medications or IM medications.  Will give IM Compazine, oral Tylenol, and oral Pepcid.  Encouraged hydration with oral p.o. fluids   1146 Remaining lab work is reassuring.  CMP without significant electrolyte abnormalities.  Normal creatinine.  Only mildly elevated alk phos to 118 but bilirubin and LFTs within normal limits.  Lipase is normal.  Initial troponin is negative and in the setting of normal EKG and resolution of symptoms low suspicion for ACS.  Patient refusing to have chest x-ray at this time.  Given reassuring lung exam and no evidence of hypoxia or increased work of breathing low suspicion for PNA or pneumothorax.  Discussed risks and benefits of not obtaining chest imaging which patient understands and still declines.  Patient tolerating p.o. intake at this time.  We will discharge patient home with close PCP follow-up and strict return precautions.  Patient expresses understanding and agreeable with this discharge plan.       Social Determinants of Health with Concerns     Financial Resource Strain: Not on file   Internet Connectivity: Not on file   Food Insecurity: Food Insecurity Present (08/29/2019)    Hunger Vital Sign     Worried About Running Out of Food in the Last Year: Sometimes true     Ran Out of Food in the Last Year: Sometimes true   Tobacco Use: Medium Risk (04/18/2022)    Patient History     Smoking Tobacco Use: Former     Smokeless Tobacco Use: Never     Passive Exposure: Not on file   Housing/Utilities: Not on file   Alcohol Use: Not on file   Transportation Needs: Not on file   Substance Use: Not on file   Health Literacy: Not on file   Physical Activity: Not on file   Interpersonal Safety: Not on file   Stress: Not on file   Intimate Partner Violence: Not on file   Depression: Not on file   Social Connections: Not on file     _____________________________________________________________________    The case was discussed with the attending physician who is in agreement with the above assessment and plan    Additional Medical Decision Making     I have reviewed the vital signs and the nursing notes. Labs and radiology results that were available during my care of the patient were independently reviewed by me and considered in my medical decision making.   I independently visualized the EKG tracing if performed  I independently visualized the radiology images if performed  I reviewed the patient's prior medical records if available.  Additional history obtained from family if available.  For specific reads/information impacting care please refer to MDM/ED Course continued documentation    Past History     PAST MEDICAL HISTORY/PAST SURGICAL HISTORY:   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    Myalgia     SBO (small bowel obstruction) (CMS-HCC) 2011       Past Surgical History:   Procedure Laterality Date    CHOLECYSTECTOMY      COLON SURGERY      partial resection    HERNIA REPAIR      PR CLOSE ENTEROSTOMY,RESEC+COLOREC ANAS Midline 11/01/2019  Procedure: CLO ENTEROSTOMY; W/RESECT COLORECTAL ANASTOM;  Surgeon: Claretta Fraise, MD;  Location: MAIN OR [place];  Service: Gastrointestinal    PR COLONOSCOPY FLX DX W/COLLJ SPEC WHEN PFRMD Left 01/07/2013    Procedure: COLONOSCOPY, FLEXIBLE, PROXIMAL TO SPLENIC FLEXURE; DIAGNOSTIC, W/WO COLLECTION SPECIMEN BY BRUSH OR WASH;  Surgeon: Tish Men, MD;  [facility];  Service: Gastroenterology    PR COLONOSCOPY FLX DX W/COLLJ SPEC WHEN PFRMD  03/09/2015    Procedure: COLONOSCOPY, FLEXIBLE, PROXIMAL TO SPLENIC FLEXURE; DIAGNOSTIC, W/WO COLLECTION SPECIMEN BY BRUSH OR WASH;  Surgeon: Cletis Athens, MD;  Location: GI PROC[facility]URES [facility];  Service: Gastroenterology    PR COLONOSCOPY FLX DX W/COLLJ SPEC WHEN PFRMD N/A 10/28/2019    Procedure: COLONOSCOPY, FLEXIBLE, PROXIMAL TO SPLENIC FLEXURE; DIAGNOSTIC, W/WO COLLECTION SPECIMEN BY BRUSH OR WASH;  Surgeon: Beverly Milch, MD;  Location: GI [facility]URES [facility];  Service: Gastroenterology    PR EXPLORATORY OF ABDOMEN Midline 09/02/2019    Procedure: EXPLORATORY LAPAROTOMY, EXPLORATORY CELIOTOMY WITH OR WITHOUT BIOPSY(S);  Surgeon: Claretta Fraise, MD;  Location: MAIN OR [facility];  Service: Gastrointestinal    PR EXPLORATORY OF ABDOMEN Midline 11/01/2019    Procedure: EXPLORATORY LAPAROTOMY, EXPLORATORY CELIOTOMY WITH OR WITHOUT BIOPSY(S);  Surgeon: Claretta Fraise, MD; Location: MAIN OR [facility];  Service: Gastrointestinal    PR FREEING BOWEL ADHESION,ENTEROLYSIS N/A 11/01/2019    Procedure: Enterolysis (Separt Proc);  Surgeon: Claretta Fraise, MD;  Location: MAIN OR [facility];  Service: Gastrointestinal    PR ILEOSCOPY THRU STOMA,BIOPSY N/A 10/14/2019    Procedure: Karren Cobble; [place] Saa 1/MX;  Surgeon: Neysa Hotter, MD;  Location: [facility];  Service: Gastroenterology    PR PART REMOVAL COLON W COLOSTOMY Midline 09/02/2019    Procedure: COLECTOMY, PARTIAL; WITH SKIN LEVEL CECOSTOMY OR COLOSTOMY;  Surgeon: Claretta Fraise, MD;  Location: MAIN OR [facility];  Service: Gastrointestinal    PR REPAIR INCISIONAL HERNIA,REDUCIBLE Midline 09/02/2019    Procedure: REPAIR INIT INCISIONAL OR VENTRAL HERNIA; REDUCIBLE;  Surgeon: Kristopher Oppenheim, MD;  Location: MAIN OR [place];  Service: Trauma    PR UPPER GI ENDOSCOPY,BIOPSY N/A 01/07/2013    Procedure: UGI ENDOSCOPY; WITH BIOPSY, SINGLE OR MULTIPLE;  Surgeon: Tish Men, MD;  Location: GI [facility]URES [facility];  Service: Gastroenterology    PR UPPER GI ENDOSCOPY,BIOPSY N/A 10/14/2019    Procedure: UGI ENDOSCOPY; WITH BIOPSY, SINGLE OR MULTIPLE;  Surgeon: Neysa Hotter, MD;  Location: [facility];  Service: Gastroenterology       MEDICATIONS:     Current Facility-Administered Medications:     acetaminophen (TYLENOL) tablet 1,000 mg, 1,000 mg, Oral, Once, Magda Bernheim, MD    famotidine (PEPCID) tablet 20 mg, 20 mg, Oral, Once, Magda Bernheim, MD    lactated ringers bolus 1,000 mL, 1,000 mL, Intravenous, Once, Magda Bernheim, MD    prochlorperazine (COMPAZINE) injection 10 mg, 10 mg, Intravenous, Once, Magda Bernheim, MD    Current Outpatient Medications:     empty container Misc, Use as directed to dispose of Humira, Disp: 1 each, Rfl: 3    famotidine (PEPCID) 20 MG tablet, Take 1 tablet (20 mg total) by mouth two (2) times a day as needed for heartburn., Disp: 60 tablet, Rfl: 0    HUMIRA PEN CITRATE FREE 40 MG/0.4 ML, Inject the contents of 1 pen (40 mg total) under the skin every fourteen (14) days., Disp: 2 each, Rfl: 5    oxyCODONE (ROXICODONE) 5 MG immediate release tablet, Take  1 tablet (5 mg total) by mouth two (2) times a day as needed for pain., Disp: 5 tablet, Rfl: 0    ALLERGIES:   Morphine and Toradol [ketorolac]    SOCIAL HISTORY:   Social History     Tobacco Use    Smoking status: Former     Packs/day: 0.00     Years: 20.00     Additional pack years: 0.00     Total pack years: 0.00     Types: Cigarettes     Quit date: 10/02/2021     Years since quitting: 0.5    Smokeless tobacco: Never    Tobacco comments:     Motivated to quit. Smokes 0.5ppd but relights each cig   Substance Use Topics    Alcohol use: No     Alcohol/week: 0.0 standard drinks of alcohol       FAMILY HISTORY:  Family History   Problem Relation Age of Onset    Diabetes Maternal Aunt     Crohn's disease Neg Hx           Review of Systems     A review of systems was performed and relevant portions were as noted above in HPI     Physical Exam     VITAL SIGNS:    BP 97/71  - Pulse 99  - Temp 37.1 ??C (98.7 ??F) (Oral)  - Resp 18  - SpO2 100%     Constitutional:   Alert and oriented.   Head:   Normocephalic and atraumatic  Eyes:   Conjunctivae are normal, EOMI, PERRL  ENT:   No notable congestion, Mucous membranes moist, External ears normal, no notable stridor  Cardiovascular:   Rate as vitals above. Appears warm and well perfused  Respiratory:   Normal respiratory effort. Breath sounds are normal.  Gastrointestinal:   Soft, non-distended, and nontender.   Genitourinary:   Deferred  Musculoskeletal:    Normal range of motion in all extremities. No tenderness or edema noted in B/L lower extremities  Neurologic:   5/5 strength in both the distal and proximal uppers bilaterally, 5/5 in both the distal and proximal lowers bilaterally. Sensation grossly intact throughout.  Pupils equal and reactive to light bilaterally. Extraocular movements intact bilaterally.  Face symmetric and facial sensation intact bilaterally to light touch normal masseter activation bilaterally. Normal wrinkling of forehead with symmetric smile. Intact hearing bilaterally. Tongue mild-line. Palatal elevation intact bilaterally. No pronator drift.  Skin:   Skin is warm, dry and intact.       Radiology     No orders to display       Labs     Labs Reviewed   CBC W/ DIFFERENTIAL    Narrative:     The following orders were created for panel order CBC w/ Differential.                  Procedure                               Abnormality         Status                                     ---------                               -----------         ------  CBC w/ [id number]]                                                                                          Please view results for these tests on the individual orders.   COMPREHENSIVE METABOLIC PANEL   HIGH SENSITIVITY TROPONIN I - SINGLE   LIPASE   CBC W/ AUTO DIFF         Pertinent labs & imaging results that were available during my care of the patient were reviewed by me and considered in my medical decision making (see chart for details).    Please note- This chart has been created using [employer]. Chart creation errors have been sought, but may not always be located and such creation errors, especially pronoun confusion, do NOT reflect on the standard of medical care.       Magda Bernheim, MD  Resident  04/20/22 [phone]

## 2022-04-18 NOTE — Unmapped (Signed)
Pt here stating he has a migraine, Neuro intact

## 2022-04-21 NOTE — Unmapped (Unsigned)
[facility] Gastroenterology  Hospital Follow-up Clinic  Consultation Visit       Reason for Visit: Follow-up of C difficile infection  Referring Provider: Richardson Dopp   Primary Care Provider: Richardson Dopp, MD    Assessment & Plan: Jerome Kennedy is a 46 y.o. male with a PMHx of ileocolonic Crohn's disease s/p ileocecectomy (2003), ventral abdominal hernia and incisional hernia s/p repair (2004) that presents for follow-up from recent hospitalization for C difficile infection.    C difficile Infection:  --       The patient has been seen as a one-time follow-up after their recent hospitalization. Based on today's assessment, they will need ongoing care from gastroenterology and this will be arranged in our clinic. The patient will follow in our clinic for his Crohn's disease with Dr. Chauncy Passy.       Subjective   HPI: ***            Objective   Vital Signs:  There were no vitals filed for this visit.   There is no height or weight on file to calculate BMI.    Physical Exam:   {[id number]}

## 2022-04-27 ENCOUNTER — Ambulatory Visit: Admit: 2022-04-27 | Discharge: 2022-04-27 | Disposition: A | Discharge status: Home / Self Care | Payer: MEDICAID

## 2022-04-27 DIAGNOSIS — S39012A Strain of muscle, fascia and tendon of lower back, initial encounter: Principal | ICD-10-CM

## 2022-04-27 MED ADMIN — gabapentin (NEURONTIN) capsule 300 mg: 300 mg | ORAL | @ 20:00:00 | Stop: 2022-04-27

## 2022-04-27 MED ADMIN — ibuprofen (MOTRIN) tablet 800 mg: 800 mg | ORAL | @ 20:00:00 | Stop: 2022-04-27

## 2022-04-27 MED ADMIN — acetaminophen (TYLENOL) tablet 650 mg: 650 mg | ORAL | @ 20:00:00 | Stop: 2022-04-27

## 2022-04-27 MED ADMIN — cyclobenzaprine (FLEXERIL) tablet 10 mg: 10 mg | ORAL | @ 20:00:00 | Stop: 2022-04-27

## 2022-04-27 NOTE — Unmapped (Signed)
Emergency Department Provider Note        ED Clinical Impression     Final diagnoses:   Strain of lumbar region, initial encounter (Primary)       ED Assessment/Plan   Jerome Kennedy 46 y.o. patient who  has a past medical history of Anemia (04/05/2022), Anxiety, Avascular necrosis of femur head, right (CMS-HCC) (2011), Bipolar disorder (CMS-HCC), Cannabis use disorder, COPD (chronic obstructive pulmonary disease) (CMS-HCC), Crohn's disease (CMS-HCC), Depression (2007), DVT (deep venous thrombosis) (CMS-HCC), Myalgia, and SBO (small bowel obstruction) (CMS-HCC) (2011). he presents with acute onset of lower back pain that radiates down his RLE after lifting a heavy table earlier today, in the setting of approximately 2 weeks of numbness to his RLE with an overlying knot as described below.     On exam, patient is nontoxic appearing and in no acute distress. Vitals are remarkable for mild tachycardia to 104, slightly soft pressure of 97/63, otherwise patient is afebrile and satting appropriately on RA. Normal cardiopulmonary exam, HRRR and LCTAB. Abdomen is soft, nontender, and nondistended. Exam is remarkable for tenderness to palpation of the lumbar paraspinal musculature R>L. No midline tenderness, no step-offs or deformities.     Discussion of Management with other Physicians, QHP or Appropriate Source: N/A  Independent Interpretation of Studies: EKG N/A; RAD N/A, POCUS N/A  External Records Reviewed: 04/08/22 ED Provider Note - Pt seen for lumbar back pain L>R after lifting a heavy table, treated with diazepam, acetaminophen, and lidocaine patch. Discharged in stable condition with supportive care.   Escalation of Care, Consideration of Admission/Observation/Transfer: N/A  Social determinants that significantly affected care: None applicable  History obtained from other sources: None    Medical Decision Making    Based on the history and physical examination provided, I have considered dangerous causes of back pain such as ruptured AAA, aortic dissection, fracture, cancer/metastasis, infection (epidural abscess, discitis, osteomyelitis, etc), cord compression such as cauda equina and conus medullaris syndrome, pyelonephritis, renal colic, and pancreatitis. As below, the history and physical does not reveal significant red flags to suggest one of those pathologies. As such, the patient will be offered pain management in the ED, and does not require imaging or lab work at this time. Encouraged to participate in primary care and PT/OT.    Results and decision making discussed in depth with the patient and family at beside, if available. I discussed plan to discharge the patient and the important need for follow up plan. I discussed strict return precautions with them, which were included in my discharge instructions, and they understand and agree to come back to the Emergency Department if their symptoms are persistent for a repeat exam in 8-12 hrs, or sooner if things change/worsen. They express understanding, and patient with discharged in stable condition.     ED Course as of 04/27/22 2134   Wynelle Link Apr 27, 2022   1606 On reevaluation patient states that his pain is much improved and he is requesting discharge at this time.  Given the symptomatology I believe that he likely is experiencing musculoskeletal pain given the significant tenderness to palpation across the right para spinal muscles.  I did not appreciate any red flag features on his exam.  I am comfortable with discharge with outpatient follow-up at this time.  We will recommend multimodal pain management.   1610 Additionally on reevaluation patient is moving around freely and feels ready to go catch the bus.  Given and return precautions    Results  and decision making discussed in depth with the patient and family at beside, if available. I discussed plan to discharge the patient and the important need for follow up. They agree with plan. I discussed strict return precautions with them, which were included in my discharge instructions, and they understand and agree to come back to the Emergency Department if their symptoms are persistent for a repeat exam in 8-12 hrs, or sooner if things change/worsen. They express understanding, and patient is discharged in stable condition.         History     Chief Complaint   Patient presents with    Back Pain       Back Pain    Jerome Kennedy is a 46 y.o. male with past medical history of anemia, avascular necrosis of the femur, bipolar, COPD, Crohn's disease, depression, DVT not on anticoagulation, SBO presenting to the ED for evaluation of back pain. The patient reports acute onset of lower back pain that radiates down his RLE after lifting a heavy table earlier today, with an overlying knot to his dorsal foot. He was provided fentanyl en route with minimal relief of his pain. No red flag symptoms including urinary/bowel incontinence, saddle anesthesia, or other focal neurologic deficits. The patient endorses a recent history of traumatic back pain after lifting another heavy table approximately 2 weeks ago (seen 04/08/22 in this ED for this), at which time he received diazepam, acetaminophen, and lidocaine patch with symptom management. Otherwise, he currently endorses chills but no fevers, and denies abdominal pain, nausea, or vomiting. No EtOH or IVDU.     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    Myalgia     SBO (small bowel obstruction) (CMS-HCC) 2011       Past Surgical History:   Procedure Laterality Date CHOLECYSTECTOMY      COLON SURGERY      partial resection    HERNIA REPAIR      PR CLOSE ENTEROSTOMY,RESEC+COLOREC ANAS Midline 11/01/2019    Procedure: CLO ENTEROSTOMY; W/RESECT COLORECTAL ANASTOM;  Surgeon: Claretta Fraise, MD;  Location: MAIN OR [place];  Service: Gastrointestinal    PR COLONOSCOPY FLX DX W/COLLJ SPEC WHEN PFRMD Left 01/07/2013    Procedure: COLONOSCOPY, FLEXIBLE, PROXIMAL TO SPLENIC FLEXURE; DIAGNOSTIC, W/WO COLLECTION SPECIMEN BY BRUSH OR WASH;  Surgeon: Tish Men, MD;  [facility];  Service: Gastroenterology    PR COLONOSCOPY FLX DX W/COLLJ SPEC WHEN PFRMD  03/09/2015    Procedure: COLONOSCOPY, FLEXIBLE, PROXIMAL TO SPLENIC FLEXURE; DIAGNOSTIC, W/WO COLLECTION SPECIMEN BY BRUSH OR WASH;  Surgeon: Cletis Athens, MD;  Location: GI [facility]URES [facility];  Service: Gastroenterology    PR COLONOSCOPY FLX DX W/COLLJ SPEC WHEN PFRMD N/A 10/28/2019    Procedure: COLONOSCOPY, FLEXIBLE, PROXIMAL TO SPLENIC FLEXURE; DIAGNOSTIC, W/WO COLLECTION SPECIMEN BY BRUSH OR WASH;  Surgeon: Beverly Milch, MD;  Location: GI PROCEDURES [facility];  Service: Gastroenterology    PR EXPLORATORY OF ABDOMEN Midline 09/02/2019  Procedure: EXPLORATORY LAPAROTOMY, EXPLORATORY CELIOTOMY WITH OR WITHOUT BIOPSY(S);  Surgeon: Claretta Fraise, MD;  Location: MAIN OR [facility];  Service: Gastrointestinal    PR EXPLORATORY OF ABDOMEN Midline 11/01/2019    Procedure: EXPLORATORY LAPAROTOMY, EXPLORATORY CELIOTOMY WITH OR WITHOUT BIOPSY(S);  Surgeon: Claretta Fraise, MD;  Location: MAIN OR [facility];  Service: Gastrointestinal    PR FREEING BOWEL ADHESION,ENTEROLYSIS N/A 11/01/2019    Procedure: Enterolysis (Separt Proc);  Surgeon: Claretta Fraise, MD;  Location: MAIN OR [facility];  Service: Gastrointestinal    PR ILEOSCOPY THRU STOMA,BIOPSY N/A 10/14/2019    Procedure: Karren Cobble; [place] Saa 1/MX;  Surgeon: Neysa Hotter, MD;  Location: GI [facility]URES [facility];  Service: Gastroenterology    PR PART REMOVAL COLON W COLOSTOMY Midline 09/02/2019    Procedure: COLECTOMY, PARTIAL; WITH SKIN LEVEL CECOSTOMY OR COLOSTOMY;  Surgeon: Claretta Fraise, MD;  Location: MAIN OR [facility];  Service: Gastrointestinal    PR REPAIR INCISIONAL HERNIA,REDUCIBLE Midline 09/02/2019    Procedure: REPAIR INIT INCISIONAL OR VENTRAL HERNIA; REDUCIBLE;  Surgeon: Kristopher Oppenheim, MD;  Location: MAIN OR [place];  Service: Trauma    PR UPPER GI ENDOSCOPY,BIOPSY N/A 01/07/2013    Procedure: UGI ENDOSCOPY; WITH BIOPSY, SINGLE OR MULTIPLE;  Surgeon: Tish Men, MD;  Location: GI [facility]URES [facility];  Service: Gastroenterology    PR UPPER GI ENDOSCOPY,BIOPSY N/A 10/14/2019    Procedure: UGI ENDOSCOPY; WITH BIOPSY, SINGLE OR MULTIPLE;  Surgeon: Neysa Hotter, MD;  Location: GI PROCEDURES [facility];  Service: Gastroenterology       Family History   Problem Relation Age of Onset    Diabetes Maternal Aunt     Crohn's disease Neg Hx        Social History     Socioeconomic History    Marital status: Divorced   Tobacco Use    Smoking status: Former     Packs/day: 0.00     Years: 20.00     Additional pack years: 0.00     Total pack years: 0.00     Types: Cigarettes     Quit date: 10/02/2021     Years since quitting: 0.5    Smokeless tobacco: Never    Tobacco comments:     Motivated to quit. Smokes 0.5ppd but relights each cig   Vaping Use    Vaping Use: Never used   Substance and Sexual Activity    Alcohol use: No     Alcohol/week: 0.0 standard drinks of alcohol    Drug use: Yes     Types: Marijuana     Comment: Denies currently, once a month   Social History Narrative    Recently moved to the [place] area, lives with cousin     Social Determinants of Health     Food Insecurity: Food Insecurity Present (08/29/2019)    Hunger Vital Sign     Worried About Running Out of Food in the Last Year: Sometimes true     Ran Out of Food in the Last Year: Sometimes true       No current facility-administered medications for this encounter. Current Outpatient Medications   Medication Sig Dispense Refill    empty container Misc Use as directed to dispose of Humira 1 each 3    famotidine (PEPCID) 20 MG tablet Take 1 tablet (20 mg total) by mouth two (2) times a day as needed for heartburn. 60 tablet 0    HUMIRA PEN CITRATE FREE 40 MG/0.4 ML Inject the contents of  1 pen (40 mg total) under the skin every fourteen (14) days. 2 each 5    oxyCODONE (ROXICODONE) 5 MG immediate release tablet Take 1 tablet (5 mg total) by mouth two (2) times a day as needed for pain. 5 tablet 0       Review of Systems   Musculoskeletal:  Positive for back pain.     A 10 point review of systems was performed and negative except as noted in the history of present illness.     Physical Exam     BP 97/63  - Pulse 104  - Temp 37 ??C (98.6 ??F) (Oral)  - Resp 16  - SpO2 99%     Physical Exam  Constitutional:       General: He is not in acute distress.     Appearance: He is not toxic-appearing.   HENT:      Head: Normocephalic and atraumatic.   Eyes:      Extraocular Movements: Extraocular movements intact.      Pupils: Pupils are equal, round, and reactive to light.   Cardiovascular:      Rate and Rhythm: Normal rate and regular rhythm.   Pulmonary:      Effort: Pulmonary effort is normal. No respiratory distress.      Breath sounds: Normal breath sounds. No wheezing, rhonchi or rales.   Abdominal:      General: There is no distension.      Palpations: Abdomen is soft.      Tenderness: There is no abdominal tenderness.   Musculoskeletal:      Comments: Tenderness to palpation of the lumbar paraspinal musculature R>L. No midline tenderness, no step-offs or deformities.   Skin:     General: Skin is warm and dry.      Capillary Refill: Capillary refill takes less than 2 seconds.   Neurological:      General: No focal deficit present.      Mental Status: He is alert.                Documentation assistance was provided by Lorrine Kin, Scribe, on April 27, 2022 at 2:37 PM for Julio Alm, MD.      Documentation assistance was provided by the scribe in my presence.  The documentation recorded by the scribe has been reviewed by me and accurately reflects the services I personally performed.                Geanie Cooley, MD  Resident  04/28/22 [phone]

## 2022-04-27 NOTE — Unmapped (Signed)
50 IN fentanyl in route

## 2022-04-27 NOTE — Unmapped (Signed)
Back pain/ leg pain after picking up a heavy table approx 1 hr ago

## 2022-04-30 NOTE — Unmapped (Signed)
[facility] Clinical Assessment & Refill Coordination Note    Jerome Kennedy, : Aug 04, 1976  Phone: There are no phone numbers on file.    All above HIPAA information was verified with patient.     Was a Nurse, learning disability used for this call? No    Specialty Medication(s):   Inflammatory Disorders: Humira     Current Outpatient Medications   Medication Sig Dispense Refill    empty container Misc Use as directed to dispose of Humira 1 each 3    famotidine (PEPCID) 20 MG tablet Take 1 tablet (20 mg total) by mouth two (2) times a day as needed for heartburn. 60 tablet 0    HUMIRA PEN CITRATE FREE 40 MG/0.4 ML Inject the contents of 1 pen (40 mg total) under the skin every fourteen (14) days. 2 each 5    oxyCODONE (ROXICODONE) 5 MG immediate release tablet Take 1 tablet (5 mg total) by mouth two (2) times a day as needed for pain. 5 tablet 0     No current facility-administered medications for this visit.        Changes to medications: Corneal reports no changes at this time.    Allergies   Allergen Reactions    Morphine Itching and Rash    Toradol [Ketorolac] Itching       Changes to allergies: No    SPECIALTY MEDICATION ADHERENCE     Humira 40  mg/0.92mL : 0 days of medicine on hand   Medication Adherence    Patient reported X missed doses in the last month: 0  Specialty Medication: Humira                            Specialty medication(s) dose(s) confirmed: Regimen is correct and unchanged.     Are there any concerns with adherence? No    Adherence counseling provided? Not needed    CLINICAL MANAGEMENT AND INTERVENTION      Clinical Benefit Assessment:    Do you feel the medicine is effective or helping your condition? Yes    Clinical Benefit counseling provided? Not needed    Adverse Effects Assessment:    Are you experiencing any side effects? No    Are you experiencing difficulty administering your medicine? No    Quality of Life Assessment:    Quality of Life Rheumatology  Oncology  Dermatology  Cystic Fibrosis          How many days over the past month did your crohn's  keep you from your normal activities? For example, brushing your teeth or getting up in the morning. 0    Have you discussed this with your provider? Not needed    Acute Infection Status:    Acute infections noted within Epic:  C Difficile  Patient reported infection: None    Therapy Appropriateness:    Is therapy appropriate and patient progressing towards therapeutic goals? Yes, therapy is appropriate and should be continued    DISEASE/MEDICATION-SPECIFIC INFORMATION      For patients on injectable medications: Patient currently has 0 doses left.  Next injection is scheduled for ~05/09/2022.    PATIENT SPECIFIC NEEDS     Does the patient have any physical, cognitive, or cultural barriers? No    Is the patient high risk? No    Does the patient require a Care Management Plan? No     SOCIAL DETERMINANTS OF HEALTH  At the [facility], we have learned that life circumstances - like trouble affording food, housing, utilities, or transportation can affect the health of many of our patients.   That is why we wanted to ask: are you currently experiencing any life circumstances that are negatively impacting your health and/or quality of life? Yes    Social Determinants of Health     Financial Resource Strain: Not on file   Internet Connectivity: Not on file   Food Insecurity: Food Insecurity Present (08/29/2019)    Hunger Vital Sign     Worried About Running Out of Food in the Last Year: Sometimes true     Ran Out of Food in the Last Year: Sometimes true   Tobacco Use: Medium Risk (04/18/2022)    Patient History     Smoking Tobacco Use: Former     Smokeless Tobacco Use: Never     Passive Exposure: Not on file   Housing/Utilities: Not on file   Alcohol Use: Not on file   Transportation Needs: Not on file   Substance Use: Not on file   Health Literacy: Not on file   Physical Activity: Not on file Interpersonal Safety: Not on file   Stress: Not on file   Intimate Partner Violence: Not on file   Depression: Not on file   Social Connections: Not on file       Would you be willing to receive help with any of the needs that you have identified today? No       SHIPPING     Specialty Medication(s) to be Shipped:   Inflammatory Disorders: Humira    Other medication(s) to be shipped: No additional medications requested for fill at this time     Changes to insurance: No    Delivery Scheduled: Yes, Expected medication delivery date: 05/06/2022.     Medication will be delivered via Same Day Courier to the confirmed prescription address in [facility].    The patient will receive a drug information handout for each medication shipped and additional FDA Medication Guides as required.  Verified that patient has previously received a Conservation officer, historic buildings and a Surveyor, mining.    The patient or caregiver noted above participated in the development of this care plan and knows that they can request review of or adjustments to the care plan at any time.      All of the patient's questions and concerns have been addressed.    Teofilo Pod, PharmD   [facility] Specialty Pharmacist

## 2022-05-06 MED FILL — HUMIRA PEN CITRATE FREE 40 MG/0.4 ML: SUBCUTANEOUS | 28 days supply | Qty: 2 | Fill #1

## 2022-05-06 NOTE — Unmapped (Unsigned)
{  JLHGILOCAL:77552}  {JLHVISITTYPE:99852}       Reason for Visit: {JLHGICLINICRFV:77553}  Referring Provider: Richardson Dopp   Primary Care Provider: Richardson Dopp, MD    Assessment & Plan: Demeko Trabue is a 46 y.o. male with a PMHx of *** that presents for ***.      No follow-ups on file.       Subjective   HPI: ***    Pertinent Prior Data: ***    Objective   Vital Signs:  There were no vitals filed for this visit.   There is no height or weight on file to calculate BMI.    Physical Exam:   {GIEXAMALL:83932}

## 2022-05-19 ENCOUNTER — Ambulatory Visit: Admit: 2022-05-19 | Payer: MEDICAID

## 2022-05-22 MED ORDER — HYDROXYZINE HCL 25 MG TABLET
ORAL_TABLET | 0 refills | 0 days
Start: 2022-05-22 — End: ?

## 2022-05-28 NOTE — Unmapped (Signed)
[facility] Refill Coordination Note    Specialty Medication(s) to be Shipped:   Inflammatory Disorders: Humira    Other medication(s) to be shipped: No additional medications requested for fill at this time     Jerome Kennedy, DOB: 26-Jan-1976  Phone: There are no phone numbers on file.      All above HIPAA information was verified with patient.     Was a Nurse, learning disability used for this call? No    Completed refill call assessment today to schedule patient's medication shipment from the [facility] [phone]).  All relevant notes have been reviewed.     Specialty medication(s) and dose(s) confirmed: Regimen is correct and unchanged.   Changes to medications: Jerome Kennedy reports no changes at this time.  Changes to insurance: No  New side effects reported not previously addressed with a pharmacist or physician: None reported  Questions for the pharmacist: No    Confirmed patient received a Conservation officer, historic buildings and a Surveyor, mining with first shipment. The patient will receive a drug information handout for each medication shipped and additional FDA Medication Guides as required.       DISEASE/MEDICATION-SPECIFIC INFORMATION        For patients on injectable medications: Patient currently has 0 doses left.  Next injection is scheduled for 11/1.    SPECIALTY MEDICATION ADHERENCE     Medication Adherence    Patient reported X missed doses in the last month: 0  Specialty Medication: HUMIRA(CF) PEN 40 mg/0.4 mL injection  Patient is on additional specialty medications: No                          Were doses missed due to medication being on hold? No    Humira 40/0.4 mg/ml: 0 days of medicine on hand        REFERRAL TO PHARMACIST     Referral to the pharmacist: Not needed      [facility]     Shipping address confirmed in Epic.     Delivery Scheduled: Yes, Expected medication delivery date: 05/30/22.     Medication will be delivered via Same Day Courier to the prescription address in Epic [facility].    Jerome Kennedy   [facility] Specialty Technician

## 2022-05-29 ENCOUNTER — Ambulatory Visit: Admit: 2022-05-29 | Payer: MEDICAID

## 2022-05-30 MED FILL — HUMIRA PEN CITRATE FREE 40 MG/0.4 ML: SUBCUTANEOUS | 28 days supply | Qty: 2 | Fill #2

## 2022-05-31 NOTE — Unmapped (Signed)
Regarding: injection supply & med ?-  ----- Message from Rogelio Seen sent at 05/30/2022  5:19 PM EDT -----  If you had not called [facility], what do you think you would have done?   Make Appointment / Call Office

## 2022-05-31 NOTE — Unmapped (Signed)
Upcoming Appt:  Future Appointments   Date Time Provider Department Center   06/03/2022  1:20 PM Britt Bottom, DPM [facility]   06/10/2022 10:30 AM [facility] RM 11 [facility] [place]   06/20/2022 11:00 AM Jeanann Lewandowsky, MD [facility]       Recent:   What is the date of your last related visit?  None   Related acute medications Rx'd:  none  Home treatment tried:  none       Relevant:   Allergies: Morphine and Toradol [ketorolac]  Medications: humira injections   Health History: Crohn's  Weight: n/a       [place] [facility] patients only:  What was the date of your last cancer treatment (mm/dd/yy)?: n/a  Was the treatment oral or infusion?: n/a  Are you currently on TVEC (yes/no)?: n/a    Reason for Disposition   [1] Caller has NON-URGENT medicine question about med that PCP prescribed AND [2] triager unable to answer question    Answer Assessment - Initial Assessment Questions  1. NAME of MEDICINE: What medicine(s) are you calling about?      Humira - uses every 2 weeks, last used 4 days ago, per Epic note next dose due 06/04/22  2. QUESTION: What is your question? (e.g., double dose of medicine, side effect)      Was supposed to be delivered today   3. PRESCRIBER: Who prescribed the medicine? Reason: if prescribed by specialist, call should be referred to that group.     Unsure   4. SYMPTOMS: Do you have any symptoms? If Yes, ask: What symptoms are you having?  How bad are the symptoms (e.g., mild, moderate, severe)      Not asked   5. PREGNANCY:  Is there any chance that you are pregnant? When was your last menstrual period?      N/a    Protocols used: Medication Question Call-A-AH

## 2022-06-02 NOTE — Unmapped (Signed)
[facility] Clinical Intervention    Type of intervention: Medication access    Medication involved: Humira    Problem identified: Called regarding delivery    Intervention performed: Medication was delivered at 6:20pm on 10/27 and given to Ms. Person at front desk. Called patient to verify he received but no answer - unable to leave voicemail    Follow-up needed: n/a    Approximate time spent: 5-10 minutes    Clinical evidence used to support intervention:  verified via [facility]    Result of the intervention: Improved medication adherence    Teofilo Pod, PharmD   [facility] Specialty Pharmacist

## 2022-06-10 ENCOUNTER — Ambulatory Visit: Admit: 2022-06-10 | Discharge: 2022-06-11 | Payer: MEDICAID

## 2022-06-20 ENCOUNTER — Encounter: Admit: 2022-06-20 | Discharge: 2022-06-20 | Payer: MEDICAID

## 2022-06-20 ENCOUNTER — Ambulatory Visit: Admit: 2022-06-20 | Discharge: 2022-06-21 | Payer: MEDICAID

## 2022-06-20 DIAGNOSIS — K50918 Crohn's disease, unspecified, with other complication: Principal | ICD-10-CM

## 2022-06-20 LAB — C-REACTIVE PROTEIN: C-REACTIVE PROTEIN: 17 mg/L — ABNORMAL HIGH (ref <=10.0)

## 2022-06-20 LAB — VITAMIN B12: VITAMIN B-12: 157 pg/mL — ABNORMAL LOW (ref 211–911)

## 2022-06-20 MED ORDER — FERROUS SULFATE 325 MG (65 MG IRON) TABLET,DELAYED RELEASE
ORAL_TABLET | ORAL | 3 refills | 90 days | Status: CN
Start: 2022-06-20 — End: 2023-06-20

## 2022-06-20 NOTE — Unmapped (Addendum)
Start iron tablet (ferrous sulfate 325 mg) every other day for iron deficiency anemia.   Submit your stool and get your blood drawn at the lab.

## 2022-06-20 NOTE — Unmapped (Signed)
[facility] at [facility]  Initial Consultation Visit       Reason for Visit: Inflammatory Bowel Disease  Referring Provider: Richardson Kennedy   Primary Care Provider: Bishop Kennedy, Jerome Caraway, MD    Assessment & Plan: [name] is a 46 y.o. 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     He reports 2 formed bowel movements daily however admits to recurrent lower pelvic abdominal pain a few days before his next Humira injection. His pain resolves after he takes his Humira. His last colonoscopy at [facility] in [place] (his prior GI was Dr. Netta Kennedy) in Feb 2023 showed inadequate prep but patent anastomosis with no evidence of active disease in the visualized colon. Prior to Humira (currently on 40 mg q2 weeks) he reportedly failed Remicade. Will check fecal calprotectin and CRP. May need a repeat colonoscopy for staging however he reports he will be moving out of the [employer] shelter to [place] in Jan.     Of note since his last admission to [facility] in Sept for recurrent CDI (s/p PO vancomycin QID with resolution), he was hospitalized at [facility] in October for covid.     Recommendations  - Continue current humira dose for now   - Check fecal calprotectin, CRP, B12  - Attempted to send rx for ferrous sulfate 325 mg qMWF (his recent labs at [facility] show a %sat of 10%) however patient was unsure of the name of his pharmacy. I advised him to talk to his doctor at the [facility] shelter for pharmacy name and to obtain a prescription  - I will look into IBD providers in the [place] area and let him know. Our office can help fax over referral records once a provider is identified.     Jerome Passy MD   Gastroenterology Fellow  Pager: [phone]    Patient seen and examined with Dr. Anne Kennedy who is in agreement with above assessment and plan.     Return in about 6 months (around 12/19/2022).         Subjective   HPI: Jerome Kennedy is a 46 y.o. 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     Patient was admitted to [facility] Sept 2023 for RLQ pain and diarrhea, initially started on steroids due to concern for CD flare however found to have uncomplicated C diff. His steroids were stopped and he was treated with PO vancomycin QID with resolution. He reports he is now having two formed bowel movements daily with no blood.  He does however admit to abdominal pain, infraumbilical, a few days before his next Humira dose.  He is  on Humira q. 14 days reports that abdominal pains tends to start around day 10, and resolves after his Humira dose.  He also reveals he will be leaving the men shelter at [facility] where he is currently staying.  Thinks he will be moving to [place] in January.  Inquires about IBD providers in [place].    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    Myalgia     SBO (small bowel obstruction) (CMS-HCC) 2011       Surgical History:  Past Surgical History:   Procedure Laterality Date    CHOLECYSTECTOMY      COLON SURGERY      partial resection    HERNIA REPAIR      PR CLOSE ENTEROSTOMY,RESEC+COLOREC ANAS Midline 11/01/2019    Procedure: CLO ENTEROSTOMY; W/RESECT COLORECTAL ANASTOM;  Surgeon: Claretta Fraise, MD;  Location: MAIN OR [place];  Service: Gastrointestinal    PR COLONOSCOPY FLX DX W/COLLJ SPEC WHEN PFRMD Left 01/07/2013    Procedure: COLONOSCOPY, FLEXIBLE, PROXIMAL TO SPLENIC FLEXURE; DIAGNOSTIC, W/WO COLLECTION SPECIMEN BY BRUSH OR WASH;  Surgeon: Tish Men, MD;  Location: GI PROCEDURES MEMOR[facility];  Service: Gastroenterology    PR COLONOSCOPY FLX DX W/COLLJ SPEC WHEN PFRMD  03/09/2015    Procedure: COLONOSCOPY, FLEXIBLE, PROXIMAL TO SPLENIC FLEXURE; DIAGNOSTIC, W/WO COLLECTION SPECIMEN BY BRUSH OR WASH;  Surgeon: Cletis Athens, MD;  Location: GI PROCEDURES [facility];  Service: Gastroenterology    PR COLONOSCOPY FLX DX W/COLLJ SPEC WHEN PFRMD N/A 10/28/2019    Procedure: COLONOSCOPY, FLEXIBLE, PROXIMAL TO SPLENIC FLEXURE; DIAGNOSTIC, W/WO COLLECTION SPECIMEN BY BRUSH OR WASH;  Surgeon: Beverly Milch, MD;  Location: GI PROCEDURES [facility];  Service: Gastroenterology    PR EXPLORATORY OF ABDOMEN Midline 09/02/2019    Procedure: EXPLORATORY LAPAROTOMY, EXPLORATORY CELIOTOMY WITH OR WITHOUT BIOPSY(S);  Surgeon: Claretta Fraise, MD;  Location: MAIN OR [facility];  Service: Gastrointestinal    PR EXPLORATORY OF ABDOMEN Midline 11/01/2019    Procedure: EXPLORATORY LAPAROTOMY, EXPLORATORY CELIOTOMY WITH OR WITHOUT BIOPSY(S);  Surgeon: Claretta Fraise, MD;  Location: MAIN OR [facility];  Service: Gastrointestinal    PR FREEING BOWEL ADHESION,ENTEROLYSIS N/A 11/01/2019    Procedure: Enterolysis (Separt Proc);  Surgeon: Claretta Fraise, MD;  Location: MAIN OR [facility];  Service: Gastrointestinal    PR ILEOSCOPY THRU STOMA,BIOPSY N/A 10/14/2019    Procedure: Karren Cobble; Marquette Saa 1/MX;  Surgeon: Neysa Hotter, MD;  Location: GI PROCEDURES [facility];  Service: Gastroenterology    PR PART REMOVAL COLON W COLOSTOMY Midline 09/02/2019    Procedure: COLECTOMY, PARTIAL; WITH SKIN LEVEL CECOSTOMY OR COLOSTOMY;  Surgeon: Claretta Fraise, MD;  Location: MAIN OR [facility];  Service: Gastrointestinal    PR REPAIR INCISIONAL HERNIA,REDUCIBLE Midline 09/02/2019    Procedure: REPAIR INIT INCISIONAL OR VENTRAL HERNIA; REDUCIBLE;  Surgeon: Kristopher Oppenheim, MD;  Location: MAIN OR ;  Service: Trauma    PR UPPER GI ENDOSCOPY,BIOPSY N/A 01/07/2013    Procedure:  UGI ENDOSCOPY; WITH BIOPSY, SINGLE OR MULTIPLE;  Surgeon: Tish Men, MD;  Location: GI PROCEDURES [facility];  Service: Gastroenterology    PR UPPER GI ENDOSCOPY,BIOPSY N/A 10/14/2019    Procedure: UGI ENDOSCOPY; WITH BIOPSY, SINGLE OR MULTIPLE;  Surgeon: Neysa Hotter, MD;  Location: GI PROCEDURES [facility];  Service: Gastroenterology       Family History:  Family History   Problem Relation Age of Onset    Diabetes Maternal Aunt     Crohn's disease Neg Hx        Social History:  Social History     Tobacco Use    Smoking status: Former     Packs/day: 0.00     Years: 20.00     Additional pack years: 0.00     Total pack years: 0.00     Types: Cigarettes     Quit date: 10/02/2021     Years since quitting: 0.7    Smokeless tobacco: Never    Tobacco comments:     Motivated to quit. Smokes 0.5ppd but relights each cig   Vaping Use    Vaping Use: Never used   Substance Use Topics    Alcohol use: No     Alcohol/week: 0.0 standard drinks of alcohol    Drug use: Yes     Types: Marijuana     Comment: Denies currently, once a month       Medications: Prior to Admission medications    Medication Dose, Route, Frequency   cetirizine (ZYRTEC) 10 MG tablet 1 tablet, Oral, Daily (standard)   empty container Misc Use as directed to dispose of Humira   HUMIRA PEN CITRATE FREE 40 MG/0.4 ML Inject the contents of 1 pen (40 mg total) under the skin every fourteen (14) days.   pantoprazole (PROTONIX) 40 MG tablet 40 mg, Oral, Daily (standard)   QUEtiapine (SEROQUEL) 100 MG tablet    azelastine (ASTELIN) 137 mcg (0.1 %) nasal spray 1 spray, Nasal   azithromycin (ZITHROMAX) 250 MG tablet Take 2 tablets by mouth for 1 dose; then take 1 tablet by mouth for 4 days       Allergies:  Morphine and Toradol [ketorolac]    Review of Systems:  10 systems were reviewed and are negative unless otherwise mentioned in the HPI            Objective   Vital Signs:  Vitals:    06/20/22 1113   BP: 103/61   BP Site: L Arm   BP Position: Sitting   BP Cuff Size: Small   Pulse: 94   Weight: 62 kg (136 lb 11.2 oz)   Height: 172.7 cm (5' 8)      Body mass index is 20.79 kg/m??.    Physical Exam:   Gen: NAD, answers questions appropriately  Eyes: Sclera anicteric, EOMI  HENT: atraumatic, normocephalic, MMM. OP w/o erythema or exudate   Neck: no cervical lymphadenopathy or thyromegaly, no JVD  Heart: RRR no m/r/g  Lungs: CTAB no r/r/w  Abdomen: Normoactive bowel sounds, soft, NT/ND, no rebound/guarding, no HSM  Extremities: no c/c/e, pulses +2 in b/l UE/LEs  Neuro: CN II-XI grossly intact, normal cerebellar function, normal gait. No focal deficits.  Skin:  No rashes, lesions on clothed exam  Psych: Alert and oriented, normal mood and affect.          GI Imaging and Procedures:     Colonoscopy 09/06/21:

## 2022-06-24 NOTE — Unmapped (Signed)
Received message from patient requesting a call back to provide name of pharmacy for iron prescription. Nurse attempted to reach patient X2. Patient has no VM option to leave a message. Per Dr. Marigene Ehlers note: Attempted to send rx for ferrous sulfate 325 mg qMWF (his recent labs at [facility] show a %sat of 10%) however patient was unsure of the name of his pharmacy. I advised him to talk to his doctor at the [facility] shelter for pharmacy name and to obtain a prescription.

## 2022-06-24 NOTE — Unmapped (Signed)
Called patient X2. Left voicemail stating per Dr. [name]: I advised him to talk to his doctor at the [facility] shelter for pharmacy name and to obtain a prescription.      Routing to Dr. [name] to confirm that patient needs to get prescription from [facility] shelter doctor.

## 2022-06-25 NOTE — Unmapped (Signed)
Jerome Kennedy declined the next shipment of Humira due to having a surplus on hand. [name] states that he received a shipment on 11/21 and would like a call back next month. I will reschedule refill call.

## 2022-06-25 NOTE — Unmapped (Signed)
Returned patient's call who states that he would like to provide a fax number. Patient states that he already has his pills and needs no further assistance.

## 2022-06-25 NOTE — Unmapped (Signed)
Patient called and requested a call back from provider to discuss lab results.     Routing to Dr. [name] for notification.

## 2022-06-25 NOTE — Unmapped (Signed)
Called patient and advised per Dr. [name], His fecal calprotectin is still pending. Need this to finalize a plan. Can you let him know I will call when the results are back, likely after Thanksgiving. The test is a send-out to [facility]. Patient verbalized understanding.

## 2022-07-01 DIAGNOSIS — F333 Major depressive disorder, recurrent, severe with psychotic symptoms: Principal | ICD-10-CM

## 2022-07-01 DIAGNOSIS — K50918 Crohn's disease, unspecified, with other complication: Principal | ICD-10-CM

## 2022-07-01 NOTE — Unmapped (Signed)
Attempted to reach pt to discuss plan re: ongoing abd pain and elevated fecal calprotectin. Left VM explaining that he needs to get his adalimumab drug level and adalimumab antibodies checked 1 to 3 days prior to next injection (ordered). Directed him to either [facility] or [place] locations. The latter is probably closer.     - IF undetectable drug and high titer antibody, plan to change therapy to ustekinumab.   - IF detectable drug levels, he can continue humira with a dose increase from every two weeks to every week.     Instructed pt to call back if any questions.     Chauncy Passy MD   Gastroenterology Fellow  Pager: [phone]

## 2022-07-01 NOTE — Unmapped (Addendum)
Talked to patient about possible referral to [facility] which may be closer to him, options limited with his Medicaid coverage. Per Dr. Jacqulyn Bath, could also plan for a once yearly visit with our office to remain under our care, continue with medication refills.  Patient is not sure when he may move and would prefer to stay a patient with Dr. Michele [facility] for now. He is willing to come for once yearly visits to maintain care relationship. He will let us know if plans change.

## 2022-07-02 ENCOUNTER — Ambulatory Visit
Admit: 2022-07-02 | Discharge: 2022-07-03 | Payer: MEDICAID | Attending: Plastic and Reconstructive Surgery | Primary: Plastic and Reconstructive Surgery

## 2022-07-02 NOTE — Unmapped (Signed)
Patient called back to review plan, explained to him in detail, next Humira dose is due ~12/5, he will come to [facility] lab sometime in the next few days before he takes the 12/5 dose. Patient verbalized understanding and in agreement with plan.

## 2022-07-02 NOTE — Unmapped (Signed)
ORTHOPAEDIC NEW CLINIC NOTE     ASSESSMENT:  Jerome Kennedy is a 46 y.o. 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     PLAN:  - We will schedule patient for left [facility] arthroplasty with suture suspensionplasty and left thumb volar plate tightening, MAC and regional. Risks/benefits/alternatives were discussed and patient would like to proceed. Consent was obtained.     PROCEDURES:  None    SUBJECTIVE:  Chief Complaint:  Left thumb pain.    History of Present Illness:   Jerome Kennedy is a 46 y.o. male who presents with left thumb pain and deformity. Patient states he was on steroids for his crohn's disease and that the medicine ate up his bones. He is unable to use that hand for basic activities and it is severely limiting his function.       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    Myalgia     SBO (small bowel obstruction) (CMS-HCC) 2011        Surgical History   Past Surgical History:   Procedure Laterality Date    CHOLECYSTECTOMY      COLON SURGERY      partial resection    HERNIA REPAIR      PR CLOSE ENTEROSTOMY,RESEC+COLOREC ANAS Midline 11/01/2019    Procedure: CLO ENTEROSTOMY; W/RESECT COLORECTAL ANASTOM;  Surgeon: Claretta Fraise, MD;  Location: MAIN OR [place];  Service: Gastrointestinal    PR COLONOSCOPY FLX DX W/COLLJ SPEC WHEN PFRMD Left 01/07/2013    Procedure: COLONOSCOPY, FLEXIBLE, PROXIMAL TO SPLENIC FLEXURE; DIAGNOSTIC, W/WO COLLECTION SPECIMEN BY BRUSH OR WASH;  Surgeon: Tish Men, MD; Location: GI PROCEDURES [facility];  Service: Gastroenterology    PR COLONOSCOPY FLX DX W/COLLJ SPEC WHEN PFRMD  03/09/2015    Procedure: COLONOSCOPY, FLEXIBLE, PROXIMAL TO SPLENIC FLEXURE; DIAGNOSTIC, W/WO COLLECTION SPECIMEN BY BRUSH OR WASH;  Surgeon: Cletis Athens, MD;  Location: GI PROCEDURES [facility];  Service: Gastroenterology    PR COLONOSCOPY FLX DX W/COLLJ SPEC WHEN PFRMD N/A 10/28/2019    Procedure: COLONOSCOPY, FLEXIBLE, PROXIMAL TO SPLENIC FLEXURE; DIAGNOSTIC, W/WO COLLECTION SPECIMEN BY BRUSH OR WASH;  Surgeon: Beverly Milch, MD;  Location: GI PROCEDURES [facility];  Service: Gastroenterology    PR EXPLORATORY OF ABDOMEN Midline 09/02/2019    Procedure: EXPLORATORY LAPAROTOMY, EXPLORATORY CELIOTOMY WITH OR WITHOUT BIOPSY(S);  Surgeon: Claretta Fraise, MD;  Location: MAIN OR [facility];  Service: Gastrointestinal    PR EXPLORATORY OF ABDOMEN Midline 11/01/2019    Procedure: EXPLORATORY LAPAROTOMY, EXPLORATORY CELIOTOMY WITH OR WITHOUT BIOPSY(S);  Surgeon: Claretta Fraise, MD;  Location: MAIN OR [facility];  Service: Gastrointestinal    PR FREEING BOWEL ADHESION,ENTEROLYSIS N/A 11/01/2019    Procedure: Enterolysis (Separt Proc);  Surgeon: Claretta Fraise, MD;  Location: MAIN OR [facility];  Service: Gastrointestinal    PR ILEOSCOPY THRU STOMA,BIOPSY N/A 10/14/2019    Procedure: Karren Cobble; Marquette Saa 1/MX;  Surgeon: Fortino Sic  Andres Shad, MD;  Location: GI PROCEDURES [facility];  Service: Gastroenterology    PR PART REMOVAL COLON W COLOSTOMY Midline 09/02/2019    Procedure: COLECTOMY, PARTIAL; WITH SKIN LEVEL CECOSTOMY OR COLOSTOMY;  Surgeon: Claretta Fraise, MD;  Location: MAIN OR [facility];  Service: Gastrointestinal    PR REPAIR INCISIONAL HERNIA,REDUCIBLE Midline 09/02/2019    Procedure: REPAIR INIT INCISIONAL OR VENTRAL HERNIA; REDUCIBLE;  Surgeon: Kristopher Oppenheim, MD;  Location: MAIN OR [place];  Service: Trauma    PR UPPER GI ENDOSCOPY,BIOPSY N/A 01/07/2013    Procedure: UGI ENDOSCOPY; WITH BIOPSY, SINGLE OR MULTIPLE;  Surgeon: Tish Men, MD;  Location: GI PROCEDURES [facility];  Service: Gastroenterology    PR UPPER GI ENDOSCOPY,BIOPSY N/A 10/14/2019    Procedure: UGI ENDOSCOPY; WITH BIOPSY, SINGLE OR MULTIPLE;  Surgeon: Neysa Hotter, MD;  Location: GI PROCEDURES [facility];  Service: Gastroenterology      Medications   Current Outpatient Medications   Medication Sig Dispense Refill    azelastine (ASTELIN) 137 mcg (0.1 %) nasal spray 1 spray into each nostril.      azithromycin (ZITHROMAX) 250 MG tablet Take 2 tablets by mouth for 1 dose; then take 1 tablet by mouth for 4 days      cetirizine (ZYRTEC) 10 MG tablet Take 1 tablet (10 mg total) by mouth daily.      empty container Misc Use as directed to dispose of Humira 1 each 3    HUMIRA PEN CITRATE FREE 40 MG/0.4 ML Inject the contents of 1 pen (40 mg total) under the skin every fourteen (14) days. 2 each 5    pantoprazole (PROTONIX) 40 MG tablet Take 1 tablet (40 mg total) by mouth daily.      QUEtiapine (SEROQUEL) 100 MG tablet        No current facility-administered medications for this visit.      Allergies   Morphine and Toradol [ketorolac]     Social History Tobacco use: not asked.  Alcohol use: not asked.  Drug use: not asked.  Employment:  not asked     Family History Patient denies significant family history.         Review of Systems A 10 system review of systems in addition to the musculoskeletal system was performed by intake questionnaire and was negative except as noted in HPI.     OBJECTIVE:  Physical Examination:    General  Well nourished, appearing stated age   Not in acute distress   Alert and oriented x3  Appropriate affect and mood  Appropriate to conversation   No increased work of breathing on room air    Musculoskeletal  Left Upper Extremity:  Skin  -appears pink and well perfused with no evidence of trauma.   ROM  -ROM of thumb limited  Inspection/Palpation  -Tenderness to palpation at [facility] joint  - Zigzag deformity of thumb, hyperextension at thumb MCPJ  Motor/Strength  - Grip strength and opposition limited due to position of thumb  Sensory  -sensation to light touch intact in median, radial and ulnar nerve distributions  Stability  - No gross instability noted at the elbow, wrist, or fingers  Vascular  -fingers are warm, with brisk capillary refill  2+ radial pulse  Provocative testing  -none      Test Results  Imaging  Plain films reviewed    Problem List  Active Problems:    * No active hospital problems. *

## 2022-07-02 NOTE — Unmapped (Signed)
PRE-OPERATIVE INFORMATION       Osborn Coho, MD  [facility]  Clinical Support: Arlyss Repress [phone]  (medical issues)  Surgery Scheduler: Vivien Rota [phone]  Financial Care Counselor: Dimas Aguas [phone]        Surgery Date    If a surgery date is not provided to you today, please contact our surgery scheduler Vivien Rota at [phone]. If you need to cancel your surgery for any reason please call the scheduling office asap to due so.      Surgical Time  Surgical times are given out from 2:00pm-5:00pm on the last business day before your scheduled surgery. A preoperative nurse will call you during this time to confirm when you should arrive for your surgery. If you have not been called by 5:00pm please contact [phone].      The Night Before Your Surgery     DO NOT EAT OR DRINK ANYTHING AFTER MIDNIGHT, unless instructed otherwise. If you are diabetic, check with your primary care physician for special instructions concerning your insulin dosage prior to surgery. Take your usual medications as prescribed, unless otherwise instructed.         The Morning of Your Surgery    You should wear loose fitting clothes that will not be restrictive to your surgical site. Please leave jewelry, credit cards, cash, and other valuables at home. Do not wear any piercings, nail polish, make-up, or metal hair clips the day before surgery. Contact lenses, glasses, and dentures must be removed before surgery. Please make sure to bring a case to store these items in during surgery.     You may take your usual medications as prescribed with a small sip of water, unless otherwise instructed. Do NOT drink a full glass of water.     A RESPONSIBLE ADULT (OVER 18) MUST ACCOMPANY YOU ON THE DAY OF SURGERY AND BE AVAILABLE THROUGHOUT YOUR PROCEDURE IN THE EVENT THERE ARE QUESTIONS OR COMPLICATION. THEY ALSO MUST BE AVAILABLE TO TAKE YOU HOME FOLLOWING YOUR PROCEDURE AS IT WILL NOT BE SAFE FOR YOU TO DRIVE OR TAKE PUBLIC TRANSPORTATION ALONE.     Medications    If you are instructed by Dr. Gus Puma to discontinue taking a medication, such as blood thinners, prior to surgery you will need to contact the prescribing provider of that medication to receive instructions on the safest way to do so.        FOLLOW-UP APPOINTMENTS    Your return appointment will be scheduled approximately 2-3 weeks after surgery, our scheduling office will call you to make a post operative follow up appointment after surgery.    If you need to reschedule this appointment or have questions as to the time of   your appointment please call.      OCCUPATIONAL THERAPY    You may have to attend Occupational Therapy following your surgery. A referral will be placed if it is necessary. If Occupational Therapy is determined to be necessary by your provider someone will be contacting you following your surgery to schedule. Many therapy appointments will be done same day after your first post-operative appointment with your provider.        IF YOU HAVE QUESTIONS or CONCERNS  During normal business hours, call Dr. Conard Novak clinical support staff member Jonise Weightman at [phone] for medical issues, or Dr. Conard Novak surgery scheduler, Sande Rives, at [phone] for scheduling concerns.      During evenings/nights/weekends, call [facility] at [phone] and  ask for the on-call orthopaedic surgery resident for urgent medical issues.

## 2022-07-03 ENCOUNTER — Ambulatory Visit: Admit: 2022-07-03 | Discharge: 2022-07-04 | Payer: MEDICAID

## 2022-07-08 NOTE — Unmapped (Signed)
Patient called and left a message to follow up on labs. Nurse called patient and advised the Humira level is still in process- it usually takes a week to come back so the earliest we will have it would be Thursday. Patient verbalized understanding and agreed with plan.

## 2022-07-14 NOTE — Unmapped (Signed)
error 

## 2022-07-15 DIAGNOSIS — K50918 Crohn's disease, unspecified, with other complication: Principal | ICD-10-CM

## 2022-07-15 MED ORDER — ADALIMUMAB PEN CITRATE FREE 40 MG/0.4 ML
SUBCUTANEOUS | 5 refills | 28 days | Status: CP
Start: 2022-07-15 — End: ?
  Filled 2022-07-16: qty 4, 28d supply, fill #0

## 2022-07-15 NOTE — Unmapped (Signed)
Clinical Assessment Needed For: Dose Change  Medication: HUMIRA(CF) PEN 40 mg/0.4 mL injection (adalimumab)  Last Fill Date/Day Supply: 05/30/2022 / 28  Copay $4  Was previous dose already scheduled to fill: No    Notes to Pharmacist: n/a

## 2022-07-15 NOTE — Unmapped (Signed)
Given ongoing abd pain and borderline elevated fecal calprotecin (266), advised pt to increase humira from every two weeks to every week. Will reassess after 1 month on whether to continue vs switch to ustekinumab. Advised pt to return for follow-up in early Feb. Pt verbalized understanding. In-basket sent to Jacki Cones Yopp to assist with dosing and to schedulers to facilitate appt.     Chauncy Passy MD   Gastroenterology Fellow  Pager: [phone]

## 2022-07-15 NOTE — Unmapped (Addendum)
[facility] Clinical Assessment & Refill Coordination Note    Changing Humira dose to weekly from [address] DOB: 09-12-1975  Phone: There are no phone numbers on file.    All above HIPAA information was verified with patient.     Was a Nurse, learning disability used for this call? No    Specialty Medication(s):   Inflammatory Disorders: Humira     Current Outpatient Medications   Medication Sig Dispense Refill    ADALIMUMAB PEN CITRATE FREE 40 MG/0.4 ML Inject the contents of 1 pen (40 mg total) under the skin every seven (7) days. 4 each 5    azelastine (ASTELIN) 137 mcg (0.1 %) nasal spray 1 spray into each nostril.      azithromycin (ZITHROMAX) 250 MG tablet Take 2 tablets by mouth for 1 dose; then take 1 tablet by mouth for 4 days      cetirizine (ZYRTEC) 10 MG tablet Take 1 tablet (10 mg total) by mouth daily.      empty container Misc Use as directed to dispose of Humira 1 each 3    pantoprazole (PROTONIX) 40 MG tablet Take 1 tablet (40 mg total) by mouth daily.      QUEtiapine (SEROQUEL) 100 MG tablet        No current facility-administered medications for this visit.        Changes to medications: Jebadiah reports no changes at this time.    Allergies   Allergen Reactions    Morphine Itching and Rash    Toradol [Ketorolac] Itching       Changes to allergies: No    SPECIALTY MEDICATION ADHERENCE     Humira 40  mg/0.34mL  : 0 days of medicine on hand    Medication Adherence    Patient reported X missed doses in the last month: 0  Specialty Medication: HUMIRA(CF) PEN 40 mg/0.4 mL  Patient is on additional specialty medications: No  Patient is on more than two specialty medications: No  Any gaps in refill history greater than 2 weeks in the last 3 months: no  Demonstrates understanding of importance of adherence: yes  Informant: patient                            Specialty medication(s) dose(s) confirmed: Patient reports changes to the regimen as follows: Increasing to weekly dosing      Are there any concerns with adherence? No    Adherence counseling provided? Not needed    CLINICAL MANAGEMENT AND INTERVENTION      Clinical Benefit Assessment:    Do you feel the medicine is effective or helping your condition? Yes    Clinical Benefit counseling provided? Not needed    Adverse Effects Assessment:    Are you experiencing any side effects? No    Are you experiencing difficulty administering your medicine? No    Quality of Life Assessment:    Quality of Life    Rheumatology  Oncology  Dermatology  Cystic Fibrosis          How many days over the past month did your crohn's  keep you from your normal activities? For example, brushing your teeth or getting up in the morning. 0    Have you discussed this with your provider? Not needed    Acute Infection Status:    Acute infections noted within Epic:  No active infections  Patient reported  infection: None    Therapy Appropriateness:    Is therapy appropriate and patient progressing towards therapeutic goals? Yes, therapy is appropriate and should be continued    DISEASE/MEDICATION-SPECIFIC INFORMATION      For patients on injectable medications: Patient currently has 0 doses left.  Next injection is scheduled for 12/13- due to starting weekly dosing.    Chronic Inflammatory Diseases: Have you experienced any flares in the last month? No  Has this been reported to your provider? N/a    PATIENT SPECIFIC NEEDS     Does the patient have any physical, cognitive, or cultural barriers? No    Is the patient high risk? No    Did the patient require a clinical intervention? No    Does the patient require physician intervention or other additional services (i.e., nutrition, smoking cessation, social work)? No    SOCIAL DETERMINANTS OF HEALTH     At the [facility], we have learned that life circumstances - like trouble affording food, housing, utilities, or transportation can affect the health of many of our patients.   That is why we wanted to ask: are you currently experiencing any life circumstances that are negatively impacting your health and/or quality of life? Patient declined to answer    Social Determinants of Health     Financial Resource Strain: Not on file   Internet Connectivity: Not on file   Food Insecurity: Food Insecurity Present (08/29/2019)    Hunger Vital Sign     Worried About Running Out of Food in the Last Year: Sometimes true     Ran Out of Food in the Last Year: Sometimes true   Tobacco Use: Medium Risk (06/20/2022)    Patient History     Smoking Tobacco Use: Former     Smokeless Tobacco Use: Never     Passive Exposure: Not on file   Housing/Utilities: Not on file   Alcohol Use: Not on file   Transportation Needs: Not on file   Substance Use: Not on file   Health Literacy: Not on file   Physical Activity: Not on file   Interpersonal Safety: Not on file   Stress: Not on file   Intimate Partner Violence: Not on file   Depression: Not on file   Social Connections: Not on file       Would you be willing to receive help with any of the needs that you have identified today? Not applicable       SHIPPING     Specialty Medication(s) to be Shipped:   Inflammatory Disorders: Humira    Other medication(s) to be shipped:  sharps     Changes to insurance: No    Delivery Scheduled: Yes, Expected medication delivery date: 12/13.     Medication will be delivered via Same Day Courier to the confirmed prescription address in [facility].    The patient will receive a drug information handout for each medication shipped and additional FDA Medication Guides as required.  Verified that patient has previously received a Conservation officer, historic buildings and a Surveyor, mining.    The patient or caregiver noted above participated in the development of this care plan and knows that they can request review of or adjustments to the care plan at any time.      All of the patient's questions and concerns have been addressed.    Teofilo Pod, PharmD   [facility] Specialty Pharmacist

## 2022-07-15 NOTE — Unmapped (Signed)
[facility] Pharmacist has reviewed a new prescription for Humira that indicates a dose increase.  Patient was counseled on this dosage change by Dr. [name]- see epic note from 12/12.  Next refill call date adjusted if necessary.

## 2022-07-15 NOTE — Unmapped (Signed)
Per Dr. [name], script for Humira dose escalation to 40mg  once weekly sent to [facility] with request to expedite. Patient will receive shipment 07/15/2022.  He notified shared services pharmacist that he did not want ot dose escalate per recommendation.  Dr. [name] notified with request to communicate again with patient her recommendation for once weekly therapy.

## 2022-07-16 DIAGNOSIS — E538 Deficiency of other specified B group vitamins: Principal | ICD-10-CM

## 2022-07-16 MED FILL — EMPTY CONTAINER: 120 days supply | Qty: 1 | Fill #1

## 2022-07-16 NOTE — Unmapped (Signed)
Called to confirm humira dosing, pt verbalized understanding of rationale for increase to qweekly dosing. Also advised that he come to [facility] clinic once a week for B12 injections for 1 month, after that can will continue PO repletion. He is able to coordinate [facility] own [facility] to [place] [facility].     Chauncy Passy MD   Gastroenterology Fellow  Pager: [phone]

## 2022-07-22 NOTE — Unmapped (Signed)
Jerome Kennedy left a message with new telephone number . I updated in the chart.

## 2022-07-25 ENCOUNTER — Ambulatory Visit: Admit: 2022-07-25 | Discharge: 2022-07-27 | Disposition: A | Discharge status: Home / Self Care | Payer: MEDICAID

## 2022-07-25 DIAGNOSIS — K50018 Crohn's disease of small intestine with other complication: Principal | ICD-10-CM

## 2022-07-25 LAB — CBC W/ AUTO DIFF
BASOPHILS ABSOLUTE COUNT: 0 10*9/L (ref 0.0–0.1)
BASOPHILS RELATIVE PERCENT: 0.9 %
EOSINOPHILS ABSOLUTE COUNT: 0.1 10*9/L (ref 0.0–0.5)
EOSINOPHILS RELATIVE PERCENT: 3.3 %
HEMATOCRIT: 33.4 % — ABNORMAL LOW (ref 39.0–48.0)
HEMOGLOBIN: 11.2 g/dL — ABNORMAL LOW (ref 12.9–16.5)
LYMPHOCYTES ABSOLUTE COUNT: 2 10*9/L (ref 1.1–3.6)
LYMPHOCYTES RELATIVE PERCENT: 49.6 %
MEAN CORPUSCULAR HEMOGLOBIN CONC: 33.6 g/dL (ref 32.0–36.0)
MEAN CORPUSCULAR HEMOGLOBIN: 34.3 pg — ABNORMAL HIGH (ref 25.9–32.4)
MEAN CORPUSCULAR VOLUME: 102.1 fL — ABNORMAL HIGH (ref 77.6–95.7)
MEAN PLATELET VOLUME: 7.7 fL (ref 6.8–10.7)
MONOCYTES ABSOLUTE COUNT: 0.3 10*9/L (ref 0.3–0.8)
MONOCYTES RELATIVE PERCENT: 6.3 %
NEUTROPHILS ABSOLUTE COUNT: 1.6 10*9/L — ABNORMAL LOW (ref 1.8–7.8)
NEUTROPHILS RELATIVE PERCENT: 39.9 %
PLATELET COUNT: 252 10*9/L (ref 150–450)
RED BLOOD CELL COUNT: 3.27 10*12/L — ABNORMAL LOW (ref 4.26–5.60)
RED CELL DISTRIBUTION WIDTH: 22.5 % — ABNORMAL HIGH (ref 12.2–15.2)
WBC ADJUSTED: 4 10*9/L (ref 3.6–11.2)

## 2022-07-25 LAB — URINALYSIS WITH MICROSCOPY WITH CULTURE REFLEX
BACTERIA: NONE SEEN /HPF
BILIRUBIN UA: NEGATIVE
BLOOD UA: NEGATIVE
GLUCOSE UA: NEGATIVE
KETONES UA: NEGATIVE
LEUKOCYTE ESTERASE UA: NEGATIVE
NITRITE UA: NEGATIVE
PH UA: 6.5 (ref 5.0–9.0)
RBC UA: 2 /HPF (ref <=3)
SPECIFIC GRAVITY UA: 1.048 — ABNORMAL HIGH (ref 1.003–1.030)
SQUAMOUS EPITHELIAL: 1 /HPF (ref 0–5)
UROBILINOGEN UA: <2
WBC UA: 6 /HPF — ABNORMAL HIGH (ref <=2)

## 2022-07-25 LAB — C-REACTIVE PROTEIN: C-REACTIVE PROTEIN: 16 mg/L — ABNORMAL HIGH (ref <=10.0)

## 2022-07-25 LAB — COMPREHENSIVE METABOLIC PANEL
ALBUMIN: 1.9 g/dL — ABNORMAL LOW (ref 3.4–5.0)
ALKALINE PHOSPHATASE: 133 U/L — ABNORMAL HIGH (ref 46–116)
ALT (SGPT): 15 U/L (ref 10–49)
ANION GAP: 5 mmol/L (ref 5–14)
AST (SGOT): 20 U/L (ref <=34)
BILIRUBIN TOTAL: 0.3 mg/dL (ref 0.3–1.2)
BLOOD UREA NITROGEN: 11 mg/dL (ref 9–23)
BUN / CREAT RATIO: 12
CALCIUM: 6.3 mg/dL — ABNORMAL LOW (ref 8.7–10.4)
CHLORIDE: 113 mmol/L — ABNORMAL HIGH (ref 98–107)
CO2: 27 mmol/L (ref 20.0–31.0)
CREATININE: 0.89 mg/dL
EGFR CKD-EPI (2021) MALE: >90 mL/min/{1.73_m2} (ref >=60)
GLUCOSE RANDOM: 61 mg/dL — ABNORMAL LOW (ref 70–179)
POTASSIUM: 3.5 mmol/L (ref 3.4–4.8)
PROTEIN TOTAL: 5.2 g/dL — ABNORMAL LOW (ref 5.7–8.2)
SODIUM: 145 mmol/L (ref 135–145)

## 2022-07-25 LAB — LIPASE: LIPASE: 44 U/L (ref 12–53)

## 2022-07-25 LAB — SEDIMENTATION RATE: ERYTHROCYTE SEDIMENTATION RATE: 15 mm/h (ref 0–15)

## 2022-07-25 LAB — SLIDE REVIEW

## 2022-07-25 LAB — HIGH SENSITIVITY TROPONIN I - SINGLE: HIGH SENSITIVITY TROPONIN I: 3 ng/L (ref <=53)

## 2022-07-25 LAB — IONIZED CALCIUM VENOUS: CALCIUM IONIZED VENOUS (MG/DL): 3.71 mg/dL — ABNORMAL LOW (ref 4.40–5.40)

## 2022-07-25 LAB — IRON PANEL
IRON SATURATION: 25 % (ref 20–55)
IRON: 53 ug/dL — ABNORMAL LOW
TOTAL IRON BINDING CAPACITY: 210 ug/dL — ABNORMAL LOW (ref 250–425)

## 2022-07-25 LAB — MAGNESIUM: MAGNESIUM: 1.1 mg/dL — ABNORMAL LOW (ref 1.6–2.6)

## 2022-07-25 MED ADMIN — fluticasone furoate-vilanterol (BREO ELLIPTA) 100-25 mcg/dose inhaler 1 puff: 1 | RESPIRATORY_TRACT

## 2022-07-25 MED ADMIN — iohexol (OMNIPAQUE) 350 mg iodine/mL solution 100 mL: 100 mL | INTRAVENOUS | @ 13:00:00 | Stop: 2022-07-25

## 2022-07-25 MED ADMIN — HYDROmorphone (PF) (DILAUDID) injection 1 mg: 1 mg | INTRAVENOUS | @ 14:00:00 | Stop: 2022-07-25

## 2022-07-25 MED ADMIN — oxyCODONE (ROXICODONE) immediate release tablet 5 mg: 5 mg | ORAL | @ 21:00:00 | Stop: 2022-08-08

## 2022-07-25 MED ADMIN — dextrose 50 % in water (D50W) 50 % solution 25 mL: 25 mL | INTRAVENOUS | @ 11:00:00 | Stop: 2022-07-25

## 2022-07-25 MED ADMIN — HYDROmorphone (PF) (DILAUDID) injection 1 mg: 1 mg | INTRAVENOUS | @ 10:00:00 | Stop: 2022-07-25

## 2022-07-25 MED ADMIN — cyanocobalamin (vitamin B-12) injection 1,000 mcg: 1000 ug | INTRAMUSCULAR

## 2022-07-25 MED ADMIN — magnesium oxide (MAG-OX) tablet 800 mg: 800 mg | ORAL | @ 22:00:00

## 2022-07-25 MED ADMIN — prochlorperazine (COMPAZINE) injection 10 mg: 10 mg | INTRAVENOUS | @ 10:00:00 | Stop: 2022-07-25

## 2022-07-25 NOTE — Unmapped (Signed)
Per Dr. [name], need to stop Humira due to disease progression and start stelara. TP placed. Will plan to schedule asap once approval in place.

## 2022-07-25 NOTE — Unmapped (Signed)
Received sign out from previous provider.    Patient Summary: Jerome Kennedy is a 46 y.o. 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  He reports 2 episodes of emesis this morning as well.  He did have an episode of diarrhea last night which is since improved.  Denies fever, chills, dark/tarry/bloody stool.  No leukocytosis noted.  Troponin negative x 1.  Concern for possible Crohn's flare versus other intra-abdominal etiology.  Pending CT abdomen and reassessment.    Updates  ED Course as of 07/25/22 1850   Fri Jul 25, 2022   0820 CT abdomen shows postsurgical changes of right hemicolectomy with circumferential bowel wall thickening of the neoterminal ileum and fecalized contents proximal to the anastomosis. These findings are compatible with active inflammatory disease. There is no evidence of stricturing, fistula, or intra-abdominal abscess.   8469 Magnesium(!): 1.1   0821 WBC: 4.0   0831 Sed Rate: 15   0831 CRP(!): 16.0     Patient paged out for admission.

## 2022-07-25 NOTE — Unmapped (Signed)
Gastroenterology (Luminal) Consult Service   Initial Consultation         Assessment and Recommendations:   Jerome Kennedy is a 46 y.o. male with a PMHx of stricturing Crohn's disease status post ileocecectomy complicated by small bowel obstruction status post right hemicolectomy initially with end ileostomy now with takedown and reanastomosis in March, 2021 for who presented to [facility] with acute onset abdominal pain. The patient is seen in consultation at the request of Jerome Karvonen, MD ([facility])) for inflammatory bowel disease.    Assessment: Jerome Kennedy has a history of stricturing Crohn's disease and is here after presenting to the emergency department for hypocalcemia and incidentally developed acute on chronic recurrent crampy abdominal pain last night around 6 PM.  His CT does show evidence of circumferential bowel wall thickening with possible narrowing with upstream focal dilation though on exam appears nonobstructed with a benign abdomen. Will review with our IBD team outpatient regarding treatment options but overall feel that he is safe for discharge with close GI follow-up given no evidence of obstruction and stable labs. If his increased dosing of Humira does not control his disease in the future, we will plan to initiate ustekinumab ( we will start this process today but will likely take weeks to start outpatient).  We have discussed this with the patient who is in agreement with the plan.    Recommendations:  - Continue with Humira weekly dosing  - We will initiate the process for starting ustekinumab outpatient if he experiences no improvement of recurrent abdominal pain on weekly humira  -Can hold off on starting steroids given no signs of a flare based on history or laboratory findings  - Recommend repletion of electrolytes including magnesium and potassium prior to discharge  - We will arrange for follow-up in mid to late January with our GI team  - Patient was educated that if he has recurrent abdominal pain or obstruction symptoms to return to the emergency department  -Please give 1000 meq of B12 injection prior to discharge given deficiency    Issues Impacting Complexity of Management:  -Discussed this patient's care with Jerome Kennedy from the primary service as summarized in this note    Recommendations discussed with the patient's primary team.     For questions, contact the on-call fellow for the Gastroenterology (Luminal) Consult Service.    Subjective:   Surgery 46 year old gentleman with a past medical history of Crohn's disease of both the small and large intestine, diagnosed in 45s and status post ileocecectomy in 2003 which has been complicated by a small bowel obstruction secondary to stricturing disease for which he is status post a right hemicolectomy with an end ileostomy in January 2021.  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    Patient had a recent admission September 15 initially thought to be due to Crohn's flare however was found to be C. difficile positive and was given p.o. vancomycin with improvement of his symptoms.  He is currently followed by [facility] gastroenterology and is on Humira every 2 weeks but this was increased recently at his last GI appointment.  He reports just starting the weekly dosing last Monday, 12/11.    More recently, he got labs drawn outpatient and was told that his calcium levels were alarmingly low and to go to the emergency department.  Additionally, he developed acute onset cramping abdominal pain last  night around 6 PM which lasted only for few hours before self resolving.  Currently, he reports feeling well and denies any abdominal pain.  He is eager to go home.  He reports having 2 episodes of solid stools per day which is his norm.    He underwent a CT scan with IV contrast which showed a long segment circumferential bowel wall thickening and hyperenhancement of the neoterminal ileum extending through the anastomosis of the proximal colon which was increased from prior CT scan along with luminal narrowing of this long segment of terminal ileum with focal proximal dilation concerning for stricture.    His labs are significant for hemoglobin of 11.2, ESR 15, magnesium 1.1 and CRP 16 which is around his baseline.    -I have reviewed the patient's prior records from outside hospitalizations and clinic visits as summarized in the HPI    Objective:   Heart Rate:  [70-106] 70  Resp:  [8-20] 8  BP: (105-111)/(76) 105/76  SpO2:  [95 %-98 %] 97 %    Gen: WDWN male in NAD, answers questions appropriately  Abdomen: Soft, nondistended and nontender.  Extremities: No edema in the BLEs    Pertinent Labs/Studies:  -I have visualized the patient's CT scan dated 12/22 which shows Postsurgical changes of ileocecectomy and right hemicolectomy with circumferential bowel wall thickening of the neoterminal ileum, anastomosis, and proximal colon. There is a focally dilated short segment of the neoterminal ileum just proximal to the anastomosis with fecalized contents, suggestive of delayed transit and may be secondary to an inflammatory stricture involving the anastomosis. The terminal ileum upstream from this dilatation appears narrowed, and may represent an additional inflammatory stricture. Overall findings are consistent with acute on chronic inflammatory bowel disease. There is no evidence of fistula or intra-abdominal abscess.

## 2022-07-25 NOTE — Unmapped (Signed)
[facility] Emergency Department Provider Note         ED Clinical Impression     Final diagnoses:   None       Presenting History and MDM     HPI    July 25, 2022 4:24 AM   Jerome Kennedy is a 46 y.o. male with history of past medical history of anemia, avascular necrosis of the femur, bipolar, COPD, Crohn's disease, depression, DVT not on anticoagulation, SBO presenting with abdominal pain.  Patient states she began having multiple episodes of diarrhea yesterday night around 6 PM.  Then around 2 AM this morning he experienced 2 episodes of emesis that was dark green without blood, he also began experiencing intense abdominal pain and cramps located diffusely.  Denies fever, chills, headache, chest pain, shortness of breath, cough, melena, hematochezia, dysuria, hematuria.     Denies alcohol use.  States he smokes half pack per day since he was 42.  Denies illicit drug use.    Initial impression and pertinent physical exam:      BP 111/76  - Pulse 79  - Resp 13  - SpO2 95%     On initial evaluation patient is nontoxic-appearing, in no acute distress.  Lung sounds clear and equal bilaterally.  Heart with regular rate and rhythm, no abnormal heart sounds.  Abdomen with evidence healed surgical scarring, protruding midline mass possibly secondary to hernia or sequela of ostomy takedown, tenderness to palpation throughout especially overlying the mass, with guarding and rebound tenderness.  No peripheral edema.  Skin warm and dry.    MDM: Given the broad differential of abdominal pain, will obtain basic labs.  Lipase to evaluate for pancreatitis.  CBC to evaluate for leukocytosis or anemia.  Chemistry to evaluate for electrolyte abnormalities, acidosis or alkalosis, and liver function test to evaluate for hepato-biliary disease.  Urinalysis to evaluate for cystitis, pyelonephritis, hematuria suggest renal colic.  Suspect possible Crohn's flare or SBO, although patient with diarrhea recently, will get CT abdomen pelvis with IV contrast.  Also possible is flu, as it is prevalent lately, and will order swab.      Diagnostic workup as below.     Orders Placed This Encounter   Procedures    Influenza/ RSV/COVID PCR    CT Abdomen Pelvis W IV Contrast    CBC w/ Differential    Comprehensive Metabolic Panel    Lipase    Magnesium    hsTroponin I (single, no delta)    Urinalysis with Microscopy with Culture Reflex    Sedimentation rate, manual    C-reactive protein    POCT Glucose - RN Obtain Once    ECG 12 Lead       Will reassess as we get results and update below    ED Course as of 07/25/22 2024   Fri Jul 25, 2022   0534 HGB(!): 11.2  At baseline level of anemia   0540 ECG 12 Lead  My independent interpretation:     Normal rate, sinus rhythm  Normal axis  QRS, PR are all within normal limits  Prolonged QTc  No ST segment elevations or depressions     0557 Glucose(!): 61  Will give amp of D50   0559 Lipase:    Lipase 44  Low concern for possible pancreatitis although will possibly have more evidence on CT   0559 hsTroponin I (single, no delta):    hsTroponin I 3   0639 Will surely sign out  patient to oncoming provider.  Briefly patient has history of Crohn's disease, presenting with abdominal pain, nausea vomiting, diarrhea with concern for Crohn's flare.  At this time, patient with CT abdomen pelvis pending.   9629 Calcium(!): 6.3  Corrected calcium is 8.0, slightly hypocalcemic.   0700 SGOT (AST): 20   0700 ALT: 15   0700 Alkaline Phosphatase(!): 133   0700 Total Bilirubin: 0.3                 Discussion of Management with other Physicians, QHP, or Appropriate Source: None  Independent Interpretation of Studies: EKG as above  External Records Reviewed: Dr. Chauncy Passy telephone encounters.  Escalation of Care, Consideration of Admission/Observation/Transfer:  likely admission  Social determinants that significantly affected care:   Prescription drug(s) considered but not prescribed:   Diagnostic tests considered but not performed:               _____________________________________________________________________    The case was discussed with attending physician who is in agreement with the above assessment and plan    Additional Medical Decision Making     I have reviewed the vital signs and the nursing notes. Labs and radiology results that were available during my care of the patient were independently reviewed by me and considered in my medical decision making.   I independently visualized the EKG tracing if performed  I independently visualized the radiology images if performed  I reviewed the patient's prior medical records if available.  Additional history obtained from family if available    Other History     CHIEF COMPLAINT:   Chief Complaint   Patient presents with    Abdominal Pain       PAST MEDICAL HISTORY/PAST SURGICAL HISTORY:   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    Myalgia     SBO (small bowel obstruction) (CMS-HCC) 2011       Past Surgical History:   Procedure Laterality Date    CHOLECYSTECTOMY      COLON SURGERY      partial resection    HERNIA REPAIR      PR CLOSE ENTEROSTOMY,RESEC+COLOREC ANAS Midline 11/01/2019    Procedure: CLO ENTEROSTOMY; W/RESECT COLORECTAL ANASTOM;  Surgeon: Claretta Fraise, MD;  Location: MAIN OR [place];  Service: Gastrointestinal    PR COLONOSCOPY FLX DX W/COLLJ SPEC WHEN PFRMD Left 01/07/2013    Procedure: COLONOSCOPY, FLEXIBLE, PROXIMAL TO SPLENIC FLEXURE; DIAGNOSTIC, W/WO COLLECTION SPECIMEN BY BRUSH OR WASH;  Surgeon: Tish Men, MD;  Location: [facility];  Service: Gastroenterology    PR COLONOSCOPY FLX DX W/COLLJ SPEC WHEN PFRMD  03/09/2015    Procedure: COLONOSCOPY, FLEXIBLE, PROXIMAL TO SPLENIC FLEXURE; DIAGNOSTIC, W/WO COLLECTION SPECIMEN BY BRUSH OR WASH;  Surgeon: Cletis Athens, MD;  Location: GI [facility]URES [facility];  Service: Gastroenterology    PR COLONOSCOPY FLX DX W/COLLJ SPEC WHEN PFRMD N/A 10/28/2019    Procedure: COLONOSCOPY, FLEXIBLE, PROXIMAL TO SPLENIC FLEXURE; DIAGNOSTIC, W/WO COLLECTION  SPECIMEN BY BRUSH OR WASH;  Surgeon: Beverly Milch, MD;  Location: GI [facility]URES [facility];  Service: Gastroenterology    PR EXPLORATORY OF ABDOMEN Midline 09/02/2019    Procedure: EXPLORATORY LAPAROTOMY, EXPLORATORY CELIOTOMY WITH OR WITHOUT BIOPSY(S);  Surgeon: Claretta Fraise, MD;  Location: MAIN OR [facility];  Service: Gastrointestinal    PR EXPLORATORY OF ABDOMEN Midline 11/01/2019    Procedure: EXPLORATORY LAPAROTOMY, EXPLORATORY CELIOTOMY WITH OR WITHOUT BIOPSY(S);  Surgeon: Claretta Fraise, MD;  Location: MAIN OR [facility];  Service: Gastrointestinal    PR FREEING BOWEL ADHESION,ENTEROLYSIS N/A 11/01/2019    Procedure: Enterolysis (Separt Proc);  Surgeon: Claretta Fraise, MD;  Location: MAIN OR [facility];  Service: Gastrointestinal    PR ILEOSCOPY THRU STOMA,BIOPSY N/A 10/14/2019    Procedure: Karren Cobble; [place] Saa 1/MX;  Surgeon: Neysa Hotter, MD;  Location: GI [facility];  Service: Gastroenterology    PR PART REMOVAL COLON W COLOSTOMY Midline 09/02/2019    Procedure: COLECTOMY, PARTIAL; WITH SKIN LEVEL CECOSTOMY OR COLOSTOMY;  Surgeon: Claretta Fraise, MD;  Location: MAIN OR [facility];  Service: Gastrointestinal    PR REPAIR INCISIONAL HERNIA,REDUCIBLE Midline 09/02/2019    Procedure: REPAIR INIT INCISIONAL OR VENTRAL HERNIA; REDUCIBLE;  Surgeon: Kristopher Oppenheim, MD;  Location: MAIN OR [place];  Service: Trauma    PR UPPER GI ENDOSCOPY,BIOPSY N/A 01/07/2013    Procedure: UGI ENDOSCOPY; WITH BIOPSY, SINGLE OR MULTIPLE; Surgeon: Tish Men, MD;  Location: GI [facility]URES [facility];  Service: Gastroenterology    PR UPPER GI ENDOSCOPY,BIOPSY N/A 10/14/2019    Procedure: UGI ENDOSCOPY; WITH BIOPSY, SINGLE OR MULTIPLE;  Surgeon: Neysa Hotter, MD;  Location: GI [facility]URES [facility];  Service: Gastroenterology       MEDICATIONS:     Current Facility-Administered Medications:     cyanocobalamin (vitamin B-12) injection 1,000 mcg, 1,000 mcg, Intramuscular, Q7 Days, Jeanann Lewandowsky, MD    Current Outpatient Medications:     ADALIMUMAB PEN CITRATE FREE 40 MG/0.4 ML, Inject the contents of 1 pen (40 mg total) under the skin every seven (7) days., Disp: 4 each, Rfl: 5    azelastine (ASTELIN) 137 mcg (0.1 %) nasal spray, 1 spray into each nostril., Disp: , Rfl:     azithromycin (ZITHROMAX) 250 MG tablet, Take 2 tablets by mouth for 1 dose; then take 1 tablet by mouth for 4 days, Disp: , Rfl:     cetirizine (ZYRTEC) 10 MG tablet, Take 1 tablet (10 mg total) by mouth daily., Disp: , Rfl:     empty container Misc, Use as directed to dispose of Humira, Disp: 1 each, Rfl: 3    pantoprazole (PROTONIX) 40 MG tablet, Take 1 tablet (40 mg total) by mouth daily., Disp: , Rfl:     QUEtiapine (SEROQUEL) 100 MG tablet, , Disp: , Rfl:     ALLERGIES:   Morphine and Toradol [ketorolac]    SOCIAL HISTORY:   Social History     Tobacco Use    Smoking status: Former     Current packs/day: 0.00     Types: Cigarettes     Quit date: 10/02/2001     Years since quitting: 20.8    Smokeless tobacco: Never    Tobacco comments:     Motivated to quit. Smokes 0.5ppd but relights each cig   Substance Use Topics    Alcohol use: No     Alcohol/week: 0.0 standard drinks of alcohol       FAMILY HISTORY:  Family History  Problem Relation Age of Onset    Diabetes Maternal Aunt     Crohn's disease Neg Hx           Review of Systems    A 10 point review of systems was performed and is negative other than positive elements noted in HPI     Physical Exam     BP 111/76  - Pulse 79  - Resp 13  - SpO2 95%     General: Awake and alert, in no distress.  Skin: Skin is warm and dry.  Lungs: Normal respiratory effort. Breath sounds clear bilaterally.  Heart: Regular rhythm, normal heart sounds.  Abdomen: Healed surgical scarring, protruding midline mass possibly secondary to hernia or sequela of ostomy takedown, tenderness to palpation throughout especially overlying the mass, with guarding and rebound tenderness. No CVA tenderness.  Musculoskeletal: No deformities or tenderness.  Neurological: No gross focal neurologic deficits are appreciated.  Psychiatric: Normal affect and behavior for situation    Radiology     CT Abdomen Pelvis W IV Contrast    (Results Pending)       Labs     Labs Reviewed   COMPREHENSIVE METABOLIC PANEL - Abnormal; Notable for the following components:       Result Value    Chloride 113 (*)     Glucose 61 (*)     Calcium 6.3 (*)     Albumin 1.9 (*)     Total Protein 5.2 (*)     Alkaline Phosphatase 133 (*)     All other components within normal limits   MAGNESIUM - Abnormal; Notable for the following components:    Magnesium 1.1 (*)     All other components within normal limits   CBC W/ AUTO DIFF - Abnormal; Notable for the following components:    RBC 3.27 (*)     HGB 11.2 (*)     HCT 33.4 (*)     MCV 102.1 (*)     MCH 34.3 (*)     RDW 22.5 (*)     Absolute Neutrophils 1.6 (*)     Macrocytosis Slight (*)     Anisocytosis Marked (*)     All other components within normal limits   INFLUENZA/RSV/COVID PCR - Normal    Narrative:     This test was performed using the Cepheid Xpert Xpress CoV-2/Flu/RSV plus assay, which has been validated by the CLIA-certified, CAP-inspected [employer] FDA has granted Emergency Use Authorization for this test. Negative results do not preclude infection and should be interpreted along with clinical observations, patient history, and epidemiological information. Information for providers and patients can be found here: [email or web address]   LIPASE - Normal   HIGH SENSITIVITY TROPONIN I - SINGLE - Normal   POCT GLUCOSE, INTERFACED - Normal   CBC W/ DIFFERENTIAL    Narrative:     The following orders were created for panel order CBC w/ Differential.                  Procedure                               Abnormality         Status                                     ---------                               -----------         ------  CBC w/ Differential[[id number]]         Abnormal            Final result                               Morphology Review[[id number]]                               In process                                                   Please view results for these tests on the individual orders.   URINALYSIS WITH MICROSCOPY WITH CULTURE REFLEX   SEDIMENTATION RATE   C-REACTIVE PROTEIN   SLIDE REVIEW       Please note- This chart has been created using [employer]. Chart creation errors have been sought, but may not always be located and such creation errors, especially pronoun confusion, do NOT reflect on the standard of medical care.     Earlene Plater, MD  Resident  07/25/22 [phone]

## 2022-07-26 LAB — COMPREHENSIVE METABOLIC PANEL
ALBUMIN: 1.9 g/dL — ABNORMAL LOW (ref 3.4–5.0)
ALKALINE PHOSPHATASE: 118 U/L — ABNORMAL HIGH (ref 46–116)
ALT (SGPT): 14 U/L (ref 10–49)
ANION GAP: 6 mmol/L (ref 5–14)
AST (SGOT): 19 U/L (ref <=34)
BILIRUBIN TOTAL: 0.2 mg/dL — ABNORMAL LOW (ref 0.3–1.2)
BLOOD UREA NITROGEN: 6 mg/dL — ABNORMAL LOW (ref 9–23)
BUN / CREAT RATIO: 8
CALCIUM: 6.7 mg/dL — ABNORMAL LOW (ref 8.7–10.4)
CHLORIDE: 113 mmol/L — ABNORMAL HIGH (ref 98–107)
CO2: 23 mmol/L (ref 20.0–31.0)
CREATININE: 0.77 mg/dL
EGFR CKD-EPI (2021) MALE: >90 mL/min/{1.73_m2} (ref >=60)
GLUCOSE RANDOM: 118 mg/dL (ref 70–179)
POTASSIUM: 3.6 mmol/L (ref 3.4–4.8)
PROTEIN TOTAL: 4.9 g/dL — ABNORMAL LOW (ref 5.7–8.2)
SODIUM: 142 mmol/L (ref 135–145)

## 2022-07-26 LAB — MAGNESIUM: MAGNESIUM: 2.2 mg/dL (ref 1.6–2.6)

## 2022-07-26 MED ORDER — VITAMIN B COMPLEX TABLET
ORAL_TABLET | Freq: Every day | ORAL | 11 refills | 30.00000 days
Start: 2022-07-26 — End: 2023-07-26

## 2022-07-26 MED ORDER — MAGNESIUM OXIDE 400 MG (241.3 MG MAGNESIUM) TABLET
ORAL_TABLET | Freq: Two times a day (BID) | ORAL | 0 refills | 30.00000 days
Start: 2022-07-26 — End: ?

## 2022-07-26 MED ORDER — ASCORBIC ACID (VITAMIN C) 250 MG TABLET
ORAL_TABLET | ORAL | 11 refills | 60.00000 days
Start: 2022-07-26 — End: ?

## 2022-07-26 MED ORDER — CALCIUM CARBONATE 200 MG CALCIUM (500 MG) CHEWABLE TABLET
ORAL_TABLET | Freq: Two times a day (BID) | ORAL | 0 refills | 15.00000 days
Start: 2022-07-26 — End: ?

## 2022-07-26 MED ADMIN — ferrous sulfate tablet 325 mg: 325 mg | ORAL | @ 14:00:00

## 2022-07-26 MED ADMIN — pneumoc 20-val conj-dip cr(PF) (PREVNAR-20) vaccine 0.5 mL: .5 mL | INTRAMUSCULAR | @ 22:00:00 | Stop: 2022-07-26

## 2022-07-26 MED ADMIN — oxyCODONE (ROXICODONE) immediate release tablet 5 mg: 5 mg | ORAL | @ 02:00:00 | Stop: 2022-08-08

## 2022-07-26 MED ADMIN — influenza vaccine quad (FLUARIX, FLULAVAL, FLUZONE) (6 MOS & UP) 2023-24: .5 mL | INTRAMUSCULAR | @ 22:00:00 | Stop: 2022-07-26

## 2022-07-26 MED ADMIN — cyanocobalamin (vitamin B-12) injection 1,000 mcg: 1000 ug | SUBCUTANEOUS | @ 19:00:00 | Stop: 2022-07-28

## 2022-07-26 MED ADMIN — magnesium oxide (MAG-OX) tablet 800 mg: 800 mg | ORAL | @ 14:00:00

## 2022-07-26 MED ADMIN — oxyCODONE (ROXICODONE) immediate release tablet 5 mg: 5 mg | ORAL | @ 22:00:00 | Stop: 2022-08-08

## 2022-07-26 MED ADMIN — pantoprazole (Protonix) EC tablet 40 mg: 40 mg | ORAL | @ 14:00:00

## 2022-07-26 MED ADMIN — oxyCODONE (ROXICODONE) immediate release tablet 5 mg: 5 mg | ORAL | @ 14:00:00 | Stop: 2022-08-08

## 2022-07-26 MED ADMIN — fluticasone propionate (FLONASE) 50 mcg/actuation nasal spray 1 spray: 1 | NASAL | @ 16:00:00

## 2022-07-26 MED ADMIN — ascorbic acid (vitamin C) (VITAMIN C) tablet 250 mg: 250 mg | ORAL | @ 16:00:00

## 2022-07-26 MED ADMIN — calcium gluc in NaCl, iso-osm 1 gram/50 mL IVPB 1 g: 1 g | INTRAVENOUS | @ 16:00:00 | Stop: 2022-07-26

## 2022-07-26 MED ADMIN — cholecalciferol (vitamin D3 25 mcg (1,000 units)) tablet 25 mcg: 25 ug | ORAL | @ 14:00:00

## 2022-07-26 MED ADMIN — fluticasone furoate-vilanterol (BREO ELLIPTA) 100-25 mcg/dose inhaler 1 puff: 1 | RESPIRATORY_TRACT | @ 14:00:00

## 2022-07-26 MED ADMIN — QUEtiapine (SEROquel) tablet 100 mg: 100 mg | ORAL | @ 02:00:00

## 2022-07-26 MED ADMIN — calcium carbonate (TUMS) chewable tablet 400 mg of elem calcium: 400 mg | ORAL | @ 16:00:00

## 2022-07-26 MED ADMIN — magnesium sulfate 2gm/50mL IVPB: 2 g | INTRAVENOUS | @ 03:00:00 | Stop: 2022-07-25

## 2022-07-26 MED ADMIN — magnesium sulfate 2gm/50mL IVPB: 2 g | INTRAVENOUS | Stop: 2022-07-25

## 2022-07-26 NOTE — Unmapped (Addendum)
Jerome Kennedy is a 46 y.o. male who presented to [facility] with Periumbilical abdominal pain and n/v, in the setting of the following pertinent/contributing co-morbidities: short gut, poor absorption, Crohn's dz.  He is now improved and stable to dc home with plan for PCP and GI follow up.      Periumbilical abdominal pain - Crohn's  Concerns for possible bowel obstruction vs stricture vs Crohn's exacerbation but CT and exam reassuring and GI felt less likely cause of symptoms.  Mag, Calcium low and more likely these are contributing/causing symptoms.  Now improving with supplementation.  N/V and pain resolved.   --Continue crohns treatment with Humira weekly and follow up with his primary IBD provider, Dr [name]  --CT shows : Postsurgical changes of ileocecectomy and right hemicolectomy with circumferential bowel wall thickening of the neoterminal ileum, anastomosis, and proximal colon. There is a focally dilated short segment of the neoterminal ileum just proximal to the anastomosis with fecalized contents, suggestive of delayed transit and may be secondary to an inflammatory stricture involving the anastomosis. The terminal ileum upstream from this dilatation appears narrowed, and may represent an additional inflammatory stricture. Overall findings are consistent with acute on chronic inflammatory bowel disease. There is no evidence of fistula or intra-abdominal abscess.      Severe Hypomagnasemia with symptoms cramping with activity:  mag was 1.1 on admit and likely contributing to symptoms.  Was given IV and then started on oral as well.  Now improved and he is tolerating a diet and will provide supplements for home and recommend PCP follow up to check levels next week.      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  Will provide oral supplements for now and GI and PCP can determine if IM or subcutaneous is needed outpatient.      Iron deficiency: on oral at home and will switch to every other day and add vit c to help with likely absorption issues related to IBD.      Hypocalcemia:  ionized calcium low and was given IV calcium.  Calcium now improved and will provide tums BID and vitamin D daily for presumed low level with value pending and PCP to follow up.

## 2022-07-26 NOTE — Unmapped (Signed)
[facility] Medicine   History and Physical       Assessment and Plan     Jerome Kennedy is a 46 y.o. male who is presenting to [facility] with Periumbilical abdominal pain, in the setting of the following pertinent/contributing co-morbidities: short gut, poor absorption, Crohn's dz, vomiting .      Periumbilical abdominal pain   Concerns for possible bowel obstruction vs stricture vs Crohn's exacerbation  --On Humira weekly sees Dr [name]  --had abd pain and n/v   --CT shows   Postsurgical changes of ileocecectomy and right hemicolectomy with circumferential bowel wall thickening of the neoterminal ileum, anastomosis, and proximal colon. There is a focally dilated short segment of the neoterminal ileum just proximal to the anastomosis with fecalized contents, suggestive of delayed transit and may be secondary to an inflammatory stricture involving the anastomosis. The terminal ileum upstream from this dilatation appears narrowed, and may represent an additional inflammatory stricture. Overall findings are consistent with acute on chronic inflammatory bowel disease. There is no evidence of fistula or intra-abdominal abscess.   --will give a soft diet and see how he does if food will pass the stricture      Severe Hypomagnasemia with symptoms cramping with activity, if it gets lower could be life threatening  --his mag was 1.1, will give 4 g IV (lost IV/infiltrated while trying to get)  --started oral repletion with 800 mg mag ox. See if he gets diarrhea from this. He currently stated he had formed stools.   --recheck in am    Vit B 12 deficient  --likely from poor absorption, will give IM dose 1,000 mcg, Fellow having hard time coordinating to make sure he gets it weekly  --concerned he has other vitamin deficiencies, will check 25 hydroxy vit D, and start vit D 1000 U/d due to short gut syndrome    Iron deficiency  --also checking Iron, he is on po, but not taking it but every other day, will add vit C with it to aid in absorption and dec constipation    Hypocalcemia but low albumin  --checking ionized calcium    Secondary/Additional Active Problems:         Crohn disease (CMS-HCC)  --on Humira weekly  --appreciate GI seeing, the inflammatory markers CRP 16 and Sed Rate 15 argue for good control currently  -- close follow up after dw GI fellow and they will discuss findings with IBD team. Appreciate their help      Projectile vomiting with nausea      Overview: Prior to arrival but brought him into the hosptial      Hypomagnesemia with likely other electrolyte and vitamin deficiencies       Overview: Severe at admission 1.1     Prophylaxis  -not indicated ambulatory     Diet  -Nutrition Therapy Regular/House asked him and nurse to get soft foods    Code Status / HCDM   -Full Code, Discussed with patient at the time of admission   -  HCDM (patient stated preference): Jerome, Kennedy - Mother - [phone]    Significant Comorbid Conditions:    Hypomagnesemia and  hx of cramps with exercise    Issues Impacting Complexity of Management:  Need to follow up the labs after repletion of mag    Medical Decision Making: Discussed the patient's management and/or test interpretation with Gastroenterology as summarized within this note    I personally spent greater than 75 minutes face-to-face and non-face-to-face in the  care of this patient, which includes all pre, intra, and post visit time on the date of service.  All documented time was specific to the E/M visit and does not include any procedures that may have been performed.    HPI      Jerome Kennedy is a 46 y.o. male who is presenting to [facility] with Periumbilical abdominal pain.    [name] is a 45 year old male with a past history notable for Crohn's disease diagnosed in the 1990s which was present in the small and large intestines.  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  He currently is being treated for his Crohn's disease with weekly Humira.  The patient reports eating yesterday and having acute onset of cramping abdominal pain, some projectile vomiting, and presented to the emergency department.    While here he was given fluids pain meds antiemetics.  CT was obtained which showed circumferential bowel wall thickening of the neoterminal ileum anastomosis and proximal colon.  There is focal dilated short segment of the neoterminal ileum just proximal to the anastomosis which is fecalized.  Due to seeing this they were concerned that this could be a stricture secondary to chronic inflammation near the anastomosis.    Will discussing symptoms with him he reported of severe cramping of his muscles that occurred with exercise.  He is having a hard time with this.    On laboratory findings his inflammatory markers were relatively good compared to prior.  CRP 16 sed rate 15  Of note his magnesium was 1.1 and his calcium was fairly low but his albumin was 1.9.    ER course was notable for IV Compazine as well as 1 dose of IV Dilaudid.  And CT    Addendum the attempts to give 4 g of IV magnesium were complicated by loss of IV access, hopefully this will be rectified we started p.o. but likely limited to absorption    Med Rec Confidence   I reviewed the Medication List. The current list is Accurate    Physical Exam   Temp:  [36.5 ??C (97.7 ??F)-36.9 ??C (98.4 ??F)] 36.5 ??C (97.7 ??F)  Heart Rate:  [70-106] 86  Resp:  [8-20] 16  BP: (105-131)/(74-79) 131/79  SpO2:  [95 %-98 %] 95 %  Body mass index is 22.12 kg/m??.  GEN: NAD, lying in bed, male alert oriented  EYES: EOMI, no icterus pupils equal round reactive to light  ENT: MMM  CV: Regular with no murmurs rubs or gallops  PULM: CTA bilaterally without wheezes rhonchi's or crackles  ABD: soft, abdominal hernia where the ostomy was.  Periumbilical tenderness to palpation, infrequent bowel sounds, no rebound  EXT: No edema          ___________________________________________________________________    Medications     Prior to Admission medications    Medication Dose, Route, Frequency   ADALIMUMAB PEN CITRATE FREE 40 MG/0.4 ML Inject the contents of 1 pen (40 mg total) under the skin every seven (7) days.   budesonide-formoterol (SYMBICORT) 80-4.5 mcg/actuation inhaler 2 puffs, Inhalation, 2 times a day (standard)   cetirizine (ZYRTEC) 10 MG tablet 1 tablet, Oral, Daily (standard)   empty container Misc Use as directed to dispose of Humira   ferrous sulfate 325 (65 FE) MG tablet 325 mg, Oral, Daily  fluticasone propionate (FLONASE) 50 mcg/actuation nasal spray 1 spray, Each Nare, Every morning   pantoprazole (PROTONIX) 40 MG tablet 40 mg, Oral, Daily (standard)   QUEtiapine (SEROQUEL) 100 MG tablet Take 1 tablet (100 mg total) by mouth nightly.       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 His smoking use included cigarettes. He has never used smokeless tobacco.  Alcohol use:   reports no history of alcohol use.  Drug use:  reports current drug use. Drug: Marijuana.    Family History     Family History   Problem Relation Age of Onset    Diabetes Maternal Aunt     Crohn's disease Neg Hx        Surgical History     Past Surgical History: Procedure Laterality Date    CHOLECYSTECTOMY      COLON SURGERY      partial resection    HERNIA REPAIR      PR CLOSE ENTEROSTOMY,RESEC+COLOREC ANAS Midline 11/01/2019    Procedure: CLO ENTEROSTOMY; W/RESECT COLORECTAL ANASTOM;  Surgeon: Claretta Fraise, MD;  Location: MAIN OR [place];  Service: Gastrointestinal    PR COLONOSCOPY FLX DX W/COLLJ SPEC WHEN PFRMD Left 01/07/2013    Procedure: COLONOSCOPY, FLEXIBLE, PROXIMAL TO SPLENIC FLEXURE; DIAGNOSTIC, W/WO COLLECTION SPECIMEN BY BRUSH OR WASH;  Surgeon: Tish Men, MD;  Location: GI PROCEDURES MEMOR[facility];  Service: Gastroenterology    PR COLONOSCOPY FLX DX W/COLLJ SPEC WHEN PFRMD  03/09/2015    Procedure: COLONOSCOPY, FLEXIBLE, PROXIMAL TO SPLENIC FLEXURE; DIAGNOSTIC, W/WO COLLECTION SPECIMEN BY BRUSH OR WASH;  Surgeon: Cletis Athens, MD;  Location: GI PROCEDURES MEMORIAL [facility];  Service: Gastroenterology    PR COLONOSCOPY FLX DX W/COLLJ SPEC WHEN PFRMD N/A 10/28/2019    Procedure: COLONOSCOPY, FLEXIBLE, PROXIMAL TO SPLENIC FLEXURE; DIAGNOSTIC, W/WO COLLECTION SPECIMEN BY BRUSH OR WASH;  Surgeon: Beverly Milch, MD;  Location: GI PROCEDURES MEMORIAL [facility];  Service: Gastroenterology    PR EXPLORATORY OF ABDOMEN Midline 09/02/2019    Procedure: EXPLORATORY LAPAROTOMY, EXPLORATORY CELIOTOMY WITH OR WITHOUT BIOPSY(S);  Surgeon: Claretta Fraise, MD;  Location: MAIN OR [facility];  Service: Gastrointestinal    PR EXPLORATORY OF ABDOMEN Midline 11/01/2019    Procedure: EXPLORATORY LAPAROTOMY, EXPLORATORY CELIOTOMY WITH OR WITHOUT BIOPSY(S);  Surgeon: Claretta Fraise, MD;  Location: MAIN OR [facility];  Service: Gastrointestinal    PR FREEING BOWEL ADHESION,ENTEROLYSIS N/A 11/01/2019    Procedure: Enterolysis (Separt Proc);  Surgeon: Claretta Fraise, MD;  Location: MAIN OR [facility];  Service: Gastrointestinal    PR ILEOSCOPY THRU STOMA,BIOPSY N/A 10/14/2019    Procedure: Karren Cobble; [place] Saa 1/MX;  Surgeon: Neysa Hotter, MD;  Location: GI PROCEDURES [facility]ORIAL  [facility]; Service: Gastroenterology    PR PART REMOVAL COLON W COLOSTOMY Midline 09/02/2019    Procedure: COLECTOMY, PARTIAL; WITH SKIN LEVEL CECOSTOMY OR COLOSTOMY;  Surgeon: Claretta Fraise, MD;  Location: MAIN OR [facility];  Service: Gastrointestinal    PR REPAIR INCISIONAL HERNIA,REDUCIBLE Midline 09/02/2019    Procedure: REPAIR INIT INCISIONAL OR VENTRAL HERNIA; REDUCIBLE;  Surgeon: Kristopher Oppenheim, MD;  Location: MAIN OR [place];  Service: Trauma    PR UPPER GI ENDOSCOPY,BIOPSY N/A 01/07/2013    Procedure: UGI ENDOSCOPY; WITH BIOPSY, SINGLE OR MULTIPLE;  Surgeon: Tish Men, MD;  Location: GI PROCEDURES [facility];  Service: Gastroenterology    PR UPPER GI ENDOSCOPY,BIOPSY N/A 10/14/2019    Procedure: UGI ENDOSCOPY; WITH BIOPSY, SINGLE OR MULTIPLE;  Surgeon: Neysa Hotter, MD;  Location: GI PROCEDURES [facility]ORIAL [facility];  Service: Gastroenterology

## 2022-07-26 NOTE — Unmapped (Signed)
Hospital Medicine Daily Progress Note    Assessment/Plan:    Principal Problem:    Periumbilical abdominal pain  Active Problems:    Crohn disease (CMS-HCC)    Projectile vomiting with nausea    Hypomagnesemia  Resolved Problems:    * No resolved hospital problems. *                 Jerome Kennedy is a 46 y.o. male who presented to [facility] with Periumbilical abdominal pain and n/v, in the setting of the following pertinent/contributing co-morbidities: short gut, poor absorption, Crohn's dz.  He is now improving with supportive care and electrolyte replacement.      Periumbilical abdominal pain   Concerns for possible bowel obstruction vs stricture vs Crohn's exacerbation but CT and exam reassuring and GI felt less likely cause of symptoms.  Mag, Calcium low and more likely these are contributing/causing symptoms.  Now improving with supplementation.  N/V and pain resolved.   --Continue crohns treatment with Humira weekly and follow up with his primary IBD provider, Dr [name]  --CT shows : Postsurgical changes of ileocecectomy and right hemicolectomy with circumferential bowel wall thickening of the neoterminal ileum, anastomosis, and proximal colon. There is a focally dilated short segment of the neoterminal ileum just proximal to the anastomosis with fecalized contents, suggestive of delayed transit and may be secondary to an inflammatory stricture involving the anastomosis. The terminal ileum upstream from this dilatation appears narrowed, and may represent an additional inflammatory stricture. Overall findings are consistent with acute on chronic inflammatory bowel disease. There is no evidence of fistula or intra-abdominal abscess.      Severe Hypomagnasemia with symptoms cramping with activity, if it gets lower could be life threatening:  mag was 1.1 on admit and likely contributing to symptoms.  Was given IV and then started on oral as well.  Labs pending.   --continue oral repletion with 800 mg mag ox BID.  -- mag checks daily for now     Vit B 12 deficient  --likely from poor absorption, given IM dose 1,000 mcg in ED and will provide subqutaneous x 3 days if remains inpatient.       Iron deficiency  --on oral at home and will switch to every other day and add vit c to help with likely absorption issues related to IBD.      Hypocalcemia  --ionized calcium low and was given IV calcium.  Calcium is now pending lab.    -- Will provide tums BID  -- Vit D level pending  -- Vit D supplements started for presumed deficiency.      Secondary/Additional Active Problems:      Crohn disease (CMS-HCC): presenting symptoms less likely related to IBD flare as above. CRP 16, Sed Rate 15.  Can continue Humira weekly and would recommend close follow up after dc.   Projectile vomiting with nausea (resolved): Prior to arrival but brought him into the hospital    I personally spent 45 minutes face-to-face and non-face-to-face in the care of this patient, which includes all pre, intra, and post visit time on the date of service.  All documented time was specific to the E/M visit and does not include any procedures that may have been performed.    ___________________________________________________________________    Subjective:  No acute events overnight.  Now without symptoms of n/v or pain and wondering if he can go home.      Labs/Studies:  Labs and Studies from the last  24hrs per EMR and Reviewed    Objective:  Temp:  [36.5 ??C (97.7 ??F)-37.2 ??C (99 ??F)] 37 ??C (98.6 ??F)  Heart Rate:  [85-98] 88  Resp:  [13-19] 18  BP: (111-131)/(69-91) 115/69  SpO2:  [95 %-100 %] 98 %    GEN: NAD, sitting up in bed about to eat try of food.   EYES: EOMI  ENT: MMM  CV: RRR  PULM: CTA B  ABD: soft, NT/ND, +BS  EXT: No edema

## 2022-07-26 NOTE — Unmapped (Signed)
Alert and oriented x4. Breathing normal easy and regular on room air. Slept fairly well. Bed kept at lowest position and kept breaks locked. Call bell and phone kept within reach. Will monitor.    Problem: Adult Inpatient Plan of Care  Goal: Plan of Care Review  Outcome: Progressing  Goal: Patient-Specific Goal (Individualized)  Outcome: Progressing  Flowsheets (Taken 07/26/2022 0542)  Individualized Care Needs: No falls thru enf of shift.  Anxieties, Fears or Concerns: Pain will be tolerable thru end of shift.  Goal: Absence of Hospital-Acquired Illness or Injury  Outcome: Progressing  Intervention: Prevent Skin Injury  Recent Flowsheet Documentation  Taken 07/26/2022 0000 by Lennox Grumbles, RN  Positioning for Skin: Left  Intervention: Prevent and Manage VTE (Venous Thromboembolism) Risk  Recent Flowsheet Documentation  Taken 07/26/2022 0400 by Lennox Grumbles, RN  Anti-Embolism Intervention: (low risk) Other (Comment)  Taken 07/26/2022 0200 by Lennox Grumbles, RN  Anti-Embolism Intervention: (low risk) Other (Comment)  Taken 07/26/2022 0000 by Lennox Grumbles, RN  Anti-Embolism Intervention: (low risk) Other (Comment)  Goal: Optimal Comfort and Wellbeing  Outcome: Progressing  Goal: Readiness for Transition of Care  Outcome: Progressing  Goal: Rounds/Family Conference  Outcome: Progressing

## 2022-07-26 NOTE — Unmapped (Signed)
PIV is difficult to flush. Patient asks if we can please try to save IV site, he states I am a hard stick.  Tried to flush again and site is swelling/infiltrate.  IV removed, and catheter is bent in two places but still intact.  Unable to obtain new IV site, 2 nurses tried.    MD notified and PO magnesium was ordered until we can get IV access.    Nursing order placed for IV Team consult.    Patient states cramping is starting to feel better.

## 2022-07-26 NOTE — Unmapped (Signed)
Resident in room attempting access

## 2022-07-26 NOTE — Unmapped (Signed)
Patient is alert and oriented. VSS. He is aware of the plan of care. C/o abdomen pain once, that covered with oxycodone 5mg  prn. It appears that his pain level is improving.  He remains with out falls or injury. Monitoring his labs. Gave him Calcium and B 12 supplements. He ambulates in the room. Able to tolerate po intake. Will continue with plan of care.   Problem: Adult Inpatient Plan of Care  Goal: Plan of Care Review  Outcome: Progressing  Flowsheets (Taken 07/26/2022 1524)  Progress: improving  Plan of Care Reviewed With: patient  Goal: Patient-Specific Goal (Individualized)  Outcome: Progressing  Flowsheets (Taken 07/26/2022 1524)  Patient/Family-Specific Goals (Include Timeframe): Patient will have no abdomen pain by discharge.  Individualized Care Needs: Pain control, falls, labs.  Anxieties, Fears or Concerns: Pain is improving.  Goal: Absence of Hospital-Acquired Illness or Injury  Outcome: Progressing  Intervention: Identify and Manage Fall Risk  Flowsheets  Taken 07/26/2022 1524  Safety Interventions: fall reduction program maintained  Taken 07/26/2022 0800  Safety Interventions: fall reduction program maintained  Intervention: Prevent and Manage VTE (Venous Thromboembolism) Risk  Flowsheets  Taken 07/26/2022 1524  VTE Prevention/Management: ambulation promoted  Taken 07/26/2022 1400  Anti-Embolism Intervention: (Low risk) Other (Comment)  Taken 07/26/2022 1200  Anti-Embolism Intervention: (Low risk) Other (Comment)  Taken 07/26/2022 1130  Anti-Embolism Intervention: (Low risk) --  Taken 07/26/2022 1000  Anti-Embolism Intervention: (Low risk) Other (Comment)  Taken 07/26/2022 0800  Anti-Embolism Intervention: (Low risk) Other (Comment)  Intervention: Prevent Infection  Flowsheets (Taken 07/26/2022 1524)  Infection Prevention:   rest/sleep promoted   hand hygiene promoted  Goal: Optimal Comfort and Wellbeing  Outcome: Progressing  Intervention: Monitor Pain and Promote Comfort  Flowsheets (Taken 07/26/2022 1524)  Pain Management Interventions: pain management plan reviewed with patient/caregiver  Intervention: Provide Person-Centered Care  Flowsheets (Taken 07/26/2022 1524)  Trust Relationship/Rapport: care explained  Goal: Readiness for Transition of Care  Outcome: Progressing  Intervention: Mutually Develop Transition Plan  Flowsheets (Taken 07/26/2022 1524)  Concerns to be Addressed: discharge planning  Goal: Rounds/Family Conference  Outcome: Progressing  Flowsheets (Taken 07/26/2022 1524)  Participants:   case Production designer, theatre/television/film   nursing   physician

## 2022-07-27 LAB — BASIC METABOLIC PANEL
ANION GAP: 5 mmol/L (ref 5–14)
BLOOD UREA NITROGEN: <5 mg/dL — ABNORMAL LOW (ref 9–23)
CALCIUM: 7.5 mg/dL — ABNORMAL LOW (ref 8.7–10.4)
CHLORIDE: 116 mmol/L — ABNORMAL HIGH (ref 98–107)
CO2: 23 mmol/L (ref 20.0–31.0)
CREATININE: 0.81 mg/dL
EGFR CKD-EPI (2021) MALE: >90 mL/min/{1.73_m2} (ref >=60)
GLUCOSE RANDOM: 92 mg/dL (ref 70–179)
POTASSIUM: 3.8 mmol/L (ref 3.4–4.8)
SODIUM: 144 mmol/L (ref 135–145)

## 2022-07-27 LAB — MAGNESIUM: MAGNESIUM: 2.1 mg/dL (ref 1.6–2.6)

## 2022-07-27 MED ORDER — CALCIUM CARBONATE 200 MG CALCIUM (500 MG) CHEWABLE TABLET
ORAL_TABLET | Freq: Two times a day (BID) | ORAL | 0 refills | 15.00000 days | Status: CP
Start: 2022-07-27 — End: ?

## 2022-07-27 MED ORDER — CHOLECALCIFEROL (VITAMIN D3) 25 MCG (1,000 UNIT) TABLET
ORAL_TABLET | Freq: Every day | ORAL | 0 refills | 30.00000 days | Status: CP
Start: 2022-07-27 — End: ?

## 2022-07-27 MED ORDER — VITAMIN B COMPLEX TABLET
ORAL_TABLET | Freq: Every day | ORAL | 11 refills | 30.00000 days | Status: CP
Start: 2022-07-27 — End: 2023-07-27

## 2022-07-27 MED ORDER — NICOTINE (POLACRILEX) 4 MG GUM
BUCCAL | 0 refills | 10.00000 days | Status: CP | PRN
Start: 2022-07-27 — End: 2022-08-26

## 2022-07-27 MED ORDER — NICOTINE 21 MG/24 HR DAILY TRANSDERMAL PATCH
MEDICATED_PATCH | Freq: Every day | TRANSDERMAL | 0 refills | 28.00000 days | Status: CP
Start: 2022-07-27 — End: ?

## 2022-07-27 MED ORDER — ASCORBIC ACID (VITAMIN C) 250 MG TABLET
ORAL_TABLET | ORAL | 11 refills | 60.00000 days | Status: CP
Start: 2022-07-27 — End: ?

## 2022-07-27 MED ORDER — MAGNESIUM OXIDE 400 MG (241.3 MG MAGNESIUM) TABLET
ORAL_TABLET | Freq: Two times a day (BID) | ORAL | 0 refills | 30.00000 days | Status: CP
Start: 2022-07-27 — End: ?

## 2022-07-27 MED ADMIN — oxyCODONE (ROXICODONE) immediate release tablet 5 mg: 5 mg | ORAL | @ 03:00:00 | Stop: 2022-08-08

## 2022-07-27 MED ADMIN — oxyCODONE (ROXICODONE) immediate release tablet 5 mg: 5 mg | ORAL | @ 13:00:00 | Stop: 2022-07-27

## 2022-07-27 MED ADMIN — QUEtiapine (SEROquel) tablet 100 mg: 100 mg | ORAL | @ 03:00:00

## 2022-07-27 MED ADMIN — calcium carbonate (TUMS) chewable tablet 400 mg of elem calcium: 400 mg | ORAL | @ 13:00:00 | Stop: 2022-07-27

## 2022-07-27 MED ADMIN — magnesium oxide (MAG-OX) tablet 800 mg: 800 mg | ORAL | @ 03:00:00

## 2022-07-27 MED ADMIN — cyanocobalamin (vitamin B-12) injection 1,000 mcg: 1000 ug | SUBCUTANEOUS | @ 13:00:00 | Stop: 2022-07-27

## 2022-07-27 MED ADMIN — fluticasone propionate (FLONASE) 50 mcg/actuation nasal spray 1 spray: 1 | NASAL | @ 15:00:00 | Stop: 2022-07-27

## 2022-07-27 MED ADMIN — pantoprazole (Protonix) EC tablet 40 mg: 40 mg | ORAL | @ 13:00:00 | Stop: 2022-07-27

## 2022-07-27 MED ADMIN — magnesium oxide (MAG-OX) tablet 800 mg: 800 mg | ORAL | @ 13:00:00 | Stop: 2022-07-27

## 2022-07-27 MED ADMIN — ascorbic acid (vitamin C) (VITAMIN C) tablet 250 mg: 250 mg | ORAL | @ 13:00:00 | Stop: 2022-07-27

## 2022-07-27 MED ADMIN — cholecalciferol (vitamin D3 25 mcg (1,000 units)) tablet 25 mcg: 25 ug | ORAL | @ 13:00:00 | Stop: 2022-07-27

## 2022-07-27 MED ADMIN — nicotine (NICODERM CQ) 21 mg/24 hr patch 1 patch: 1 | TRANSDERMAL | @ 04:00:00

## 2022-07-27 MED ADMIN — fluticasone furoate-vilanterol (BREO ELLIPTA) 100-25 mcg/dose inhaler 1 puff: 1 | RESPIRATORY_TRACT | @ 13:00:00 | Stop: 2022-07-27

## 2022-07-27 NOTE — Unmapped (Signed)
Patient A&Ox4, afebrile, abdominal pain well controlled with 5mg  oxy prn. VSS on room air. Up independently in room and to bathroom. Adequate intake and output. Planning to discharge to home with self care in the morning.     Needs met for tonight.      Problem: Adult Inpatient Plan of Care  Goal: Plan of Care Review  Outcome: Progressing  Goal: Patient-Specific Goal (Individualized)  Outcome: Progressing  Goal: Absence of Hospital-Acquired Illness or Injury  Outcome: Progressing  Intervention: Identify and Manage Fall Risk  Recent Flowsheet Documentation  Taken 07/26/2022 2000 by Kathryne Eriksson, RN  Safety Interventions:   environmental modification   fall reduction program maintained   lighting adjusted for tasks/safety   low bed   room near unit station  Intervention: Prevent Skin Injury  Recent Flowsheet Documentation  Taken 07/27/2022 0400 by Kathryne Eriksson, RN  Positioning for Skin: Right  Taken 07/27/2022 0200 by Kathryne Eriksson, RN  Positioning for Skin: Left  Taken 07/27/2022 0000 by Kathryne Eriksson, RN  Positioning for Skin: Supine/Back  Taken 07/26/2022 2200 by Kathryne Eriksson, RN  Positioning for Skin: Supine/Back  Taken 07/26/2022 2000 by Kathryne Eriksson, RN  Positioning for Skin: Standing  Intervention: Prevent and Manage VTE (Venous Thromboembolism) Risk  Recent Flowsheet Documentation  Taken 07/27/2022 0400 by Kathryne Eriksson, RN  Anti-Embolism Intervention: Refused  Taken 07/27/2022 0200 by Kathryne Eriksson, RN  Anti-Embolism Intervention: Refused  Taken 07/27/2022 0000 by Kathryne Eriksson, RN  Anti-Embolism Intervention: Refused  Taken 07/26/2022 2200 by Kathryne Eriksson, RN  Anti-Embolism Intervention: Refused  Taken 07/26/2022 2000 by Kathryne Eriksson, RN  Anti-Embolism Intervention: Refused  Goal: Optimal Comfort and Wellbeing  Outcome: Progressing  Goal: Readiness for Transition of Care  Outcome: Progressing  Goal: Rounds/Family Conference  Outcome: Progressing

## 2022-07-27 NOTE — Unmapped (Addendum)
Patient is discharging. A/Ox4. Patient have all belongings. AVS printed and reviewed by the patient. Patient has no questions at this time. Vital stable. IV and tele box removed.   Problem: Adult Inpatient Plan of Care  Goal: Plan of Care Review  Outcome: Ongoing - Unchanged  Goal: Patient-Specific Goal (Individualized)  Outcome: Ongoing - Unchanged  Goal: Absence of Hospital-Acquired Illness or Injury  Outcome: Ongoing - Unchanged  Goal: Optimal Comfort and Wellbeing  Outcome: Ongoing - Unchanged  Goal: Readiness for Transition of Care  Outcome: Ongoing - Unchanged  Goal: Rounds/Family Conference  Outcome: Ongoing - Unchanged

## 2022-07-27 NOTE — Unmapped (Signed)
Physician Discharge Summary [facility]  [address] [facility]  [address]  [place] [address]  Dept: [phone]  Loc: [phone]     Identifying Information:   Jerome Kennedy  06-21-1976  [id number]    Primary Care Physician: Pcp, None Per Patient     Code Status: Full Code    Admit Date: 07/25/2022    Discharge Date: 07/27/2022     Discharge To: Home    Discharge Service: [facility] - Hospitalist [facility] APP     Discharge Attending Physician: Mervin Kung, ANP    Discharge Diagnoses:  Principal Problem:    Periumbilical abdominal pain (POA: Yes)  Active Problems:    Crohn disease (CMS-HCC) (POA: Yes)    Projectile vomiting with nausea (POA: Yes)    Hypomagnesemia (POA: Yes)  Resolved Problems:    * No resolved hospital problems. *      Outpatient Provider Follow Up Issues:   PCP for Vit D level (pending)  PCP for mag/calcium checks  PCP/GI for possible B12 injections?  PCP/GI for B12 check      Hospital Course:   Jerome Kennedy is a 46 y.o. male who presented to [facility] with Periumbilical abdominal pain and n/v, in the setting of the following pertinent/contributing co-morbidities: short gut, poor absorption, Crohn's dz.  He is now improved and stable to dc home with plan for PCP and GI follow up.      Periumbilical abdominal pain - Crohn's  Concerns for possible bowel obstruction vs stricture vs Crohn's exacerbation but CT and exam reassuring and GI felt less likely cause of symptoms.  Mag, Calcium low and more likely these are contributing/causing symptoms.  Now improving with supplementation.  N/V and pain resolved.   --Continue crohns treatment with Humira weekly and follow up with his primary IBD provider, Dr [name]  --CT shows : Postsurgical changes of ileocecectomy and right hemicolectomy with circumferential bowel wall thickening of the neoterminal ileum, anastomosis, and proximal colon. There is a focally dilated short segment of the neoterminal ileum just proximal to the anastomosis with fecalized contents, suggestive of delayed transit and may be secondary to an inflammatory stricture involving the anastomosis. The terminal ileum upstream from this dilatation appears narrowed, and may represent an additional inflammatory stricture. Overall findings are consistent with acute on chronic inflammatory bowel disease. There is no evidence of fistula or intra-abdominal abscess.      Severe Hypomagnasemia with symptoms cramping with activity:  mag was 1.1 on admit and likely contributing to symptoms.  Was given IV and then started on oral as well.  Now improved and he is tolerating a diet and will provide supplements for home and recommend PCP follow up to check levels next week.      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  Will provide oral supplements for now and GI and PCP can determine if IM or subcutaneous is needed outpatient.      Iron deficiency: on oral at home and will switch to every other day and add vit c to help with likely absorption issues related to IBD.      Hypocalcemia:  ionized calcium low and was given IV calcium.  Calcium now improved and will provide tums BID and vitamin D daily for presumed low level with value pending and PCP to follow up.     Asymptomatic bacturia:  UA without e/o infection.  UCx sent for unknown reasons  and positive with Kleb.  Patient denies current or past dysuria.  Only had abd pain cramping that is now improved without antibiotics and do not feel he has UTI and will, with shared patient decision making, defer antibiotics.          Touchbase with Outpatient Provider:  Warm Handoff: Not completed secondary to weekend/holiday dc.  Will fax dc sum to PCP office.     Procedures:  None  No admission procedures for hospital encounter.  ______________________________________________________________________  Discharge Medications:     Your Medication List        START taking these medications      ascorbic acid (vitamin C) 250 MG tablet  Commonly known as: VITAMIN C  Take 1 tablet (250 mg total) by mouth every other day. Take with oral iron     B complex vitamins tablet  Commonly known as: B COMPLEX-VITAMIN B12  Take 1 tablet by mouth daily.     calcium carbonate 200 mg calcium (500 mg) chewable tablet  Commonly known as: TUMS  Chew 2 tablets (400 mg of elem calcium total) two (2) times a day.     cholecalciferol (vitamin D3 25 mcg (1,000 units)) 1,000 unit (25 mcg) tablet  Take 1 tablet (25 mcg total) by mouth daily.     magnesium oxide 400 mg (241.3 mg elemental) tablet  Commonly known as: MAG-OX  Take 1 tablet (400 mg total) by mouth two (2) times a day.     nicotine 21 mg/24 hr patch  Commonly known as: NICODERM CQ  Place 1 patch on the skin daily.     nicotine polacrilex 4 MG gum  Commonly known as: NICORETTE  Apply 1 each (4 mg total) to cheek every two (2) hours as needed for smoking cessation.            CHANGE how you take these medications      ferrous sulfate 325 (65 FE) MG tablet  Take 1 tablet (325 mg total) by mouth every other day.  What changed: when to take this            CONTINUE taking these medications      budesonide-formoterol 80-4.5 mcg/actuation inhaler  Commonly known as: SYMBICORT  Inhale 2 puffs two (2) times a day.     empty container Misc  Use as directed to dispose of Humira     fluticasone propionate 50 mcg/actuation nasal spray  Commonly known as: FLONASE  1 spray into each nostril every morning.     HUMIRA(CF) PEN 40 mg/0.4 mL injection  Generic drug: adalimumab  Inject the contents of 1 pen (40 mg total) under the skin every seven (7) days.     pantoprazole 40 MG tablet  Commonly known as: Protonix  Take 1 tablet (40 mg total) by mouth daily.     QUEtiapine 100 MG tablet  Commonly known as: SEROquel  Take 1 tablet (100 mg total) by mouth nightly.              Allergies:  Morphine and Toradol [ketorolac]  ______________________________________________________________________  Pending Test Results (if blank, then none):  Pending Labs       Order Current Status    Vitamin D 25 Hydroxy (25OH D2 + D3) In process            Most Recent Labs:  All lab results last 24 hours -   Recent Results (from the past 24 hour(s))   Magnesium Level  Collection Time: 07/26/22  1:21 PM   Result Value Ref Range    Magnesium 2.2 1.6 - 2.6 mg/dL   Comprehensive Metabolic Panel    Collection Time: 07/26/22  1:21 PM   Result Value Ref Range    Sodium 142 135 - 145 mmol/L    Potassium 3.6 3.4 - 4.8 mmol/L    Chloride 113 (H) 98 - 107 mmol/L    CO2 23.0 20.0 - 31.0 mmol/L    Anion Gap 6 5 - 14 mmol/L    BUN 6 (L) 9 - 23 mg/dL    Creatinine 5.62 1.30 - 1.18 mg/dL    BUN/Creatinine Ratio 8     eGFR CKD-EPI (2021) Male >90 >=60 mL/min/1.72m2    Glucose 118 70 - 179 mg/dL    Calcium 6.7 (L) 8.7 - 10.4 mg/dL    Albumin 1.9 (L) 3.4 - 5.0 g/dL    Total Protein 4.9 (L) 5.7 - 8.2 g/dL    Total Bilirubin 0.2 (L) 0.3 - 1.2 mg/dL    AST 19 <=86 U/L    ALT 14 10 - 49 U/L    Alkaline Phosphatase 118 (H) 46 - 116 U/L   Basic Metabolic Panel    Collection Time: 07/27/22 10:42 AM   Result Value Ref Range    Sodium 144 135 - 145 mmol/L    Potassium 3.8 3.4 - 4.8 mmol/L    Chloride 116 (H) 98 - 107 mmol/L    CO2 23.0 20.0 - 31.0 mmol/L    Anion Gap 5 5 - 14 mmol/L    BUN <5 (L) 9 - 23 mg/dL    Creatinine 5.78 4.69 - 1.18 mg/dL    eGFR CKD-EPI (6295) Male >90 >=60 mL/min/1.38m2    Glucose 92 70 - 179 mg/dL    Calcium 7.5 (L) 8.7 - 10.4 mg/dL   Magnesium Level    Collection Time: 07/27/22 10:42 AM   Result Value Ref Range    Magnesium 2.1 1.6 - 2.6 mg/dL     Microbiology -   Microbiology Results (last day)       Procedure Component Value Date/Time Date/Time    Urine Culture [[id number]]  (Abnormal)  (Susceptibility) Collected: 07/25/22 1840    Lab Status: Final result Specimen: Urine from Clean Catch Updated: 07/27/22 0912     Urine Culture, Comprehensive 50,000 to 100,000 CFU/mL Klebsiella pneumoniae    Narrative:      Specimen Source: Clean Catch Susceptibility       Klebsiella pneumoniae (1)       Antibiotic Interpretation Microscan Method Status    Amoxicillin + Clavulanate Susceptible  MIC SUSCEPTIBILITY RESULT Final    Ampicillin Resistant  MIC SUSCEPTIBILITY RESULT Final    Ampicillin + Sulbactam Susceptible  MIC SUSCEPTIBILITY RESULT Final    Cefazolin Susceptible  MIC SUSCEPTIBILITY RESULT Final    Cephalexin Susceptible  MIC SUSCEPTIBILITY RESULT Final     For uncomplicated UTI's only.       Ciprofloxacin Susceptible  MIC SUSCEPTIBILITY RESULT Final    Gentamicin Susceptible  MIC SUSCEPTIBILITY RESULT Final    Levofloxacin Susceptible  MIC SUSCEPTIBILITY RESULT Final    Nitrofurantoin Susceptible  MIC SUSCEPTIBILITY RESULT Final    Piperacillin + Tazobactam Susceptible  MIC SUSCEPTIBILITY RESULT Final    Tetracycline Susceptible  MIC SUSCEPTIBILITY RESULT Final     Organisms that test susceptible to tetracycline are considered susceptible to doxycycline.However, some organisms that test intermediate or resistant to tetracycline may be susceptible to doxycycline.  Tobramycin Susceptible  MIC SUSCEPTIBILITY RESULT Final    Trimethoprim + Sulfamethoxazole Susceptible  MIC SUSCEPTIBILITY RESULT Final                               Relevant Studies/Radiology (if blank, then none):  No results found.  ______________________________________________________________________  Discharge Instructions:   Activity Instructions       Activity as tolerated              Diet Instructions       Discharge diet (specify)      Discharge Nutrition Therapy: Regular            Other Instructions       Call MD for:  difficulty breathing, headache or visual disturbances      Call MD for:  persistent nausea or vomiting      Call MD for:  severe uncontrolled pain      Call MD for: Temperature > 38.5 Celsius ( > 101.3 Fahrenheit)      Discharge instructions      Mr. Hensarling, you were admitted to [facility] due to abdominal pain that is likely due to low calcium and magnesium levels and has now improved after giving you calcium magnesium supplements.  I recommend you continue to use calcium (tums) and magnesium supplements and follow up closely with your PCP int he coming 1-2 weeks to have levels checked and talk about ongoing need for supplements.    I recommend that you change your oral iron supplements to every other day and use Vit C with this to help with better absorption of this iron.    Your B12 levels are low and you were given some supplements through a shot but will likely need more.  Your PCP can help ith ongoing need for this.  For now, use oral supplements and follow up with your GI and PCP providers.     Your vitamin D level is also likely low.  We have checked this and recommend using a supplement of vitamin D until your labs confirm if it is low and talk to your PCP about ongoing need of vitamin d supplements.                 Follow Up instructions and Outpatient Referrals     Call MD for:  difficulty breathing, headache or visual disturbances      Call MD for:  persistent nausea or vomiting      Call MD for:  severe uncontrolled pain      Call MD for: Temperature > 38.5 Celsius ( > 101.3 Fahrenheit)      Discharge instructions          Appointments which have been scheduled for you      Aug 07, 2022  INTERPOSIT ARTHROPLASTY-INTERCARPAL/CARPOMETACAR, REPAIR AND RECONSTRUCTION, FINGER, VOLAR PLATE, INTERPHALANGEAL JOINT with [name], MD  MAIN PERIOP [facility] REGION) [address]  [place] [address]  [phone]     Sep 03, 2022 11:00 AM  (Arrive by 10:45 AM)  RETURN  HAND with [name], MD  [facility] ORTHOPAEDICS [facility] REGION) [address] [place] [address]  [phone]        Sep 04, 2022 11:30 AM  (Arrive by 11:15 AM)  RETURN  GENERAL with Jeanann Lewandowsky, MD  [facility] REGION)  [address]  [address] Floor  [facility] [place] [address]  [phone] ______________________________________________________________________  Discharge Day Services:  BP 130/76  - Pulse 91  - Temp 36.6 ??C (97.8 ??F) (Oral)  - Resp 18  - Ht 172.7 cm (5' 8)  - Wt 66 kg (145 lb 8 oz)  - SpO2 96%  - BMI 22.12 kg/m??     Pt seen on the day of discharge and determined appropriate for discharge.    Condition at Discharge: stable    Length of Discharge: I spent greater than 30 mins in the discharge of this patient.

## 2022-07-31 LAB — VITAMIN D 25 HYDROXY: VITAMIN D, TOTAL (25OH): <4 ng/mL — ABNORMAL LOW (ref 20.0–80.0)

## 2022-08-06 NOTE — Unmapped (Shared)
Brief Pre-operative History & Physical    Patient name: Jerome Kennedy  CSN: [id number]  MRN: [id number]  Admit Date: (Not on file)  Date of Surgery: 08/07/2022  Performing Service: Orthopedics    Code Status: Full Code      Assessment/Plan:      Jerome Kennedy is a 47 y.o. 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     Consent obtained in office is accurate. Risks, benefits, and alternatives to surgery were reviewed, and all questions were answered.    Proceed to the OR as planned.         History of Present Illness:    Jerome Kennedy. is a 47 y.o. 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 He was recently seen in clinic, where a detailed HPI can be found. He was noted to benefit from:  Procedure(s) (LRB):  INTERPOSIT ARTHROPLASTY-INTERCARPAL/CARPOMETACAR (Left)  REPAIR AND RECONSTRUCTION, FINGER, VOLAR PLATE, INTERPHALANGEAL JOINT (Left).       Allergies  Morphine and Toradol [ketorolac]    Medications    No current facility-administered medications for this encounter.     Current Outpatient Medications   Medication Sig Dispense Refill    ADALIMUMAB PEN CITRATE FREE 40 MG/0.4 ML Inject the contents of 1 pen (40 mg total) under the skin every seven (7) days. 4 each 5    ascorbic acid, vitamin C, (VITAMIN C) 250 MG tablet Take 1 tablet (250 mg total) by mouth every other day. Take with oral iron 30 tablet 11    B complex vitamins (B COMPLEX-VITAMIN B12) tablet Take 1 tablet by mouth daily. 30 tablet 11    budesonide-formoterol (SYMBICORT) 80-4.5 mcg/actuation inhaler Inhale 2 puffs two (2) times a day.      calcium carbonate (TUMS) 200 mg calcium (500 mg) chewable tablet Chew 2 tablets (400 mg of elem calcium total) two (2) times a day. 60 tablet 0    cholecalciferol, vitamin D3 25 mcg, 1,000 units,, 1,000 unit (25 mcg) tablet Take 1 tablet (25 mcg total) by mouth daily. 30 tablet 0    empty container Misc Use as directed to dispose of Humira 1 each 3    ferrous sulfate 325 (65 FE) MG tablet Take 1 tablet (325 mg total) by mouth every other day.  0    fluticasone propionate (FLONASE) 50 mcg/actuation nasal spray 1 spray into each nostril every morning.      magnesium oxide (MAG-OX) 400 mg (241.3 mg elemental magnesium) tablet Take 1 tablet (400 mg total) by mouth two (2) times a day. 60 tablet 0    nicotine (NICODERM CQ) 21 mg/24 hr patch Place 1 patch on the skin daily. 28 patch 0    nicotine polacrilex (NICORETTE) 4 MG gum Apply 1 each (4 mg total) to cheek every two (2) hours as needed for smoking cessation. 110 each 0    pantoprazole (PROTONIX) 40 MG tablet Take 1 tablet (40 mg total) by mouth daily.      QUEtiapine (SEROQUEL) 100 MG tablet Take 1 tablet (100 mg total) by mouth nightly.         Vital Signs  There were no vitals taken for this visit.  Facility age limit for growth %iles is 20 years.  Facility age limit for growth %iles is 20 years..     Physical Exam  General: Well developed, appears stated age, in no acute  distress  Mental status: Alert and oriented x3  Cardiovascular: Normal  Pulmonary: Symmetric chest rise, unlabored breathing  Relevant System for Surgery: Surgical site examined    Labs and Studies:  Lab Results   Component Value Date    WBC 4.0 07/25/2022    HGB 11.2 (L) 07/25/2022    HCT 33.4 (L) 07/25/2022    PLT 252 07/25/2022       Lab Results   Component Value Date    PT 11.8 11/01/2019    INR 0.99 11/01/2019    APTT 30.0 10/21/2019

## 2022-08-07 ENCOUNTER — Ambulatory Visit: Admit: 2022-08-07 | Discharge: 2022-08-08 | Payer: MEDICAID

## 2022-08-07 ENCOUNTER — Encounter: Admit: 2022-08-07 | Discharge: 2022-08-08 | Payer: MEDICAID

## 2022-08-07 MED ORDER — OXYCODONE 5 MG TABLET
ORAL_TABLET | Freq: Four times a day (QID) | ORAL | 0 refills | 4 days | Status: CP | PRN
Start: 2022-08-07 — End: 2022-08-12
  Filled 2022-08-08: qty 15, 4d supply, fill #0

## 2022-08-07 MED ADMIN — Propofol (DIPRIVAN) injection: INTRAVENOUS | @ 15:00:00 | Stop: 2022-08-07

## 2022-08-07 MED ADMIN — dexmedeTOMIDine (Precedex) injection: INTRAVENOUS | @ 15:00:00 | Stop: 2022-08-07

## 2022-08-07 MED ADMIN — lactated Ringers infusion: 10 mL/h | INTRAVENOUS | @ 14:00:00

## 2022-08-07 MED ADMIN — ketamine (KETALAR) injection: INTRAVENOUS | @ 15:00:00 | Stop: 2022-08-07

## 2022-08-07 MED ADMIN — glycopyrrolate (ROBINUL) injection: INTRAVENOUS | @ 15:00:00 | Stop: 2022-08-07

## 2022-08-07 MED ADMIN — fentaNYL (PF) (SUBLIMAZE) injection: INTRAVENOUS | @ 14:00:00 | Stop: 2022-08-07

## 2022-08-07 MED ADMIN — ceFAZolin (ANCEF) IVPB 2 g in 50 ml dextrose (premix): 2 g | INTRAVENOUS | @ 15:00:00 | Stop: 2022-08-07

## 2022-08-07 MED ADMIN — fentaNYL (PF) (SUBLIMAZE) injection: INTRAVENOUS | @ 16:00:00 | Stop: 2022-08-07

## 2022-08-07 MED ADMIN — EXPAREL ADMINISTERED WITHIN 96 HRS - NO BUPIVACAINE FOR 96 HOURS AFTER EXPAREL: 1 | @ 14:00:00 | Stop: 2022-08-11

## 2022-08-07 MED ADMIN — bupivacaine LIPOSOMAL (PF) (EXPAREL) 133 mg, sodium chloride (NS) 0.9 % 1 mL infiltration injection: 133 mg | @ 14:00:00 | Stop: 2022-08-07

## 2022-08-07 MED ADMIN — phenylephrine 0.8 mg/10 mL (80 mcg/mL) injection: INTRAVENOUS | @ 15:00:00 | Stop: 2022-08-07

## 2022-08-07 MED ADMIN — bupivacaine (PF) (MARCAINE) 0.25 % (2.5 mg/mL) injection (PF): @ 15:00:00 | Stop: 2022-08-07

## 2022-08-07 MED ADMIN — midazolam (VERSED) injection: INTRAVENOUS | @ 14:00:00 | Stop: 2022-08-07

## 2022-08-07 MED ADMIN — phenylephrine 20 mg in sodium chloride 0.9% 250 mL (80 mcg/mL) infusion PMB: INTRAVENOUS | @ 15:00:00 | Stop: 2022-08-07

## 2022-08-07 MED ADMIN — oxyCODONE (ROXICODONE) immediate release tablet 5 mg: 5 mg | ORAL | @ 19:00:00 | Stop: 2022-08-21

## 2022-08-07 MED ADMIN — lactated Ringers infusion: 10 mL/h | INTRAVENOUS | @ 16:00:00

## 2022-08-07 MED ADMIN — dexmedeTOMIDine (Precedex) injection: INTRAVENOUS | @ 16:00:00 | Stop: 2022-08-07

## 2022-08-07 MED ADMIN — bupivacaine (PF) (MARCAINE) 0.5 % (5 mg/mL) injection (PF): PERINEURAL | @ 14:00:00 | Stop: 2022-08-07

## 2022-08-07 MED ADMIN — vasopressin (VASOSTRICT) injection: INTRAVENOUS | @ 15:00:00 | Stop: 2022-08-07

## 2022-08-07 MED ADMIN — acetaminophen (TYLENOL) tablet 1,000 mg: 1000 mg | ORAL | @ 19:00:00

## 2022-08-07 MED ADMIN — lidocaine (XYLOCAINE) 20 mg/mL (2 %) injection: INTRAVENOUS | @ 15:00:00 | Stop: 2022-08-07

## 2022-08-07 MED ADMIN — acetaminophen (OFIRMEV) 10 mg/mL injection: INTRAVENOUS | @ 15:00:00 | Stop: 2022-08-07

## 2022-08-07 NOTE — Unmapped (Signed)
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  Left trapeziectomy with suture suspensionplasty (CPT F3744781)  2. Volar plate advancement/repair left thumb metacarpophalangeal joint (CPT P6072572)     Resident Physician(s): Audrie Lia, MD    Anesthesia: Monitor Anesthesia Care with Regional block    Antibiotics: Ancef 2 grams IV preoperatively.    Tourniquet time: See anesthesia record    Estimated Blood Loss: Minimal     Complications: None     Specimens: None     Indications for Surgery:    Jerome Kennedy. is a 47 y.o. 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  Risks benefits and alternatives to surgery were discussed with the patient who wished to proceed.    Operative Findings:  See op note    Procedure:    The patient was identified in the preoperative holding area where the surgical site was marked with indelible ink. The patient was taken to the OR where a procedural timeout was called and the above noted anesthesia was induced.  Preoperative antibiotics were dosed.  The patient's left upper extremity was prepped and draped in the usual sterile fashion.  Prior to the start of the surgical procedure a second preoperative timeout was called which included verifying the correct patient, the correct operative site, and correct procedure.    We then commenced with our operation.  An Esmarch bandage was used to exsanguinate the limb and the tourniquet was inflated.       A longitudinal incision centered over the trapeziometacarpal joint was then made at the glabrous/nonglabrous border of the right thenar eminence.  Blunt dissection was used to dissect down to identify the interval between the abductor pollicis longus and extensor pollicis brevis.  Care was taken to identify and protect dorsal radial sensory nerve branches.  The interval between the APL and EPB was then opened with a 15 blade scalpel.  The tendons were retracted and a 15 blade scalpel was used to incise directly down onto the first metacarpal base.  Subperiosteal flaps were then elevated off the first metacarpal base using a 15 blade scalpel.  I then dissected proximally and identified the radial artery.  The radial artery was protected during the remainder of the procedure.  I then identified the trapezium and released all ligamentous and soft tissue attachments.  I then used an osteotome to divide the trapezium in half and completed the trapeziectomy using a rongeur.  I then identified the radial base of the second metacarpal.  A guidewire from the [employer] FiberLock system was driven in a radial to ulnar direction across the second metacarpal base.  Intraoperative fluoroscopy was used to confirm proper guidewire positioning.  The drill cannula was then fed over the guidewire and secured in place within the second metacarpal base.  The guidewire was then removed and the supplied drill bit was inserted into the guide and used to drill across the second metacarpal base.  The FiberLock insertion device was then placed through the drill guide and out the ulnar cortex of the second metacarpal.  Intraoperative fluoroscopy was used to confirm proper positioning.  The drill guide and insertion  device were then removed.  Tension was applied to the suture tape and was found to be securely positioned on the second metacarpal.  I then identified the articular groove of the first metacarpal base and drove the guidewire from the Arthrex fiber lock system into the first metacarpal base at the radial most extent of the articular groove.  Intraoperative fluoroscopy was used to confirm proper guidewire positioning.  The cannulated drill bit from the 3.5 mm swivel lock anchor system was then used to drill the osseous tunnel in the base of the first metacarpal.  The thumb was then held in slight traction and adducted against the second metacarpal.  The suture tape was then placed into the tines of the swivel lock anchor and secured in place at the first metacarpal base.  The anchor was then tested to ensure it was stable within the osseous tunnel.  Intraoperative fluoroscopy was then used to confirm proper positioning of the thumb.  The thumb was then ranged through a full arc of motion to ensure proper tensioning.  The incision was then irrigated and the periosteal flaps were closed at the trapeziectomy site using 3-0 Vicryl.  The skin was then closed using 4-0 nylon.      We then moved to the volar aspect of the left thumb where a transverse incision was made at the metacarpophalangeal joint crease and extended both proximally and distally in a Brunner's type fashion.  Full-thickness skin flaps were elevated using tenotomy scissors.  The radial and ulnar neurovascular bundles were identified and protected.  The A1 pulley was then identified at the base of the wound.  A 15 blade scalpel was then used to open the A1 pulley.  The flexor pollicis longus tendon was retracted radially.  This exposed the underlying joint capsule/volar plate.  The volar plate was released from the first metacarpal head.  The sesamoid bones were identified and sharply excised.  The drill from the mini Mytec anchor set was then used to drill an osseous tunnel in the volar aspect of the first metacarpal head at the volar plate origin.  The mini Mytec anchor was then secured in this osseous tunnel.  The attached 2-0 FiberWire was then used to perform a horizontal mattress stitch of the volar plate which was advanced proximally.  Once this was secured no further hyperextension at the metacarpal phalangeal joint was noted.  The wound was then irrigated and closed using 4-0 nylon.      Sterile dressings were placed.  The patient was placed into a plaster thumb spica splint.  The tourniquet was deflated following placement of sterile dressings with nice return of blood flow to the hand and fingers.  At the end of the case all sponge instrument needle counts were correct.  The patient was transported to the postoperative care unit in stable condition.     Post-op Plan/Instructions:     The patient will be discharged home.  The patient will see OT at the first post-op visit for splint fabrication. He will not start motion until week 4 to protect the volar plate repair.     Teaching Surgeon Attestation: Osborn Coho MD was present, scrubbed and an active participant for the entire procedure.      Jerome Bast, MD   Date: 08/07/2022  Time: 11:15 AM

## 2022-08-08 DIAGNOSIS — M1812 Unilateral primary osteoarthritis of first carpometacarpal joint, left hand: Principal | ICD-10-CM

## 2022-08-08 MED ADMIN — oxyCODONE (ROXICODONE) immediate release tablet 5 mg: 5 mg | ORAL | @ 07:00:00 | Stop: 2022-08-08

## 2022-08-08 MED ADMIN — phenol (CHLORASEPTIC) 1.4 % spray 2 spray: 2 | @ 06:00:00 | Stop: 2022-08-08

## 2022-08-08 MED ADMIN — oxyCODONE (ROXICODONE) immediate release tablet 5 mg: 5 mg | ORAL | @ 01:00:00 | Stop: 2022-08-21

## 2022-08-08 MED ADMIN — oxyCODONE (ROXICODONE) immediate release tablet 5 mg: 5 mg | ORAL | @ 11:00:00 | Stop: 2022-08-08

## 2022-08-08 MED ADMIN — acetaminophen (TYLENOL) tablet 1,000 mg: 1000 mg | ORAL | @ 11:00:00 | Stop: 2022-08-08

## 2022-08-08 MED ADMIN — oxyCODONE (ROXICODONE) immediate release tablet 5 mg: 5 mg | ORAL | @ 04:00:00 | Stop: 2022-08-07

## 2022-08-08 MED ADMIN — ondansetron (ZOFRAN-ODT) disintegrating tablet 4 mg: 4 mg | ORAL | @ 01:00:00

## 2022-08-08 MED ADMIN — acetaminophen (TYLENOL) tablet 1,000 mg: 1000 mg | ORAL | @ 01:00:00

## 2022-08-08 NOTE — Unmapped (Signed)
Problem: Adult Inpatient Plan of Care  Goal: Patient-Specific Goal (Individualized)  Outcome: Progressing  Goal: Absence of Hospital-Acquired Illness or Injury  Outcome: Progressing  Intervention: Identify and Manage Fall Risk  Recent Flowsheet Documentation  Taken 08/07/2022 2000 by Corinna Capra, RN  Safety Interventions:   fall reduction program maintained   lighting adjusted for tasks/safety   low bed   nonskid shoes/slippers when out of bed  Intervention: Prevent Skin Injury  Recent Flowsheet Documentation  Taken 08/07/2022 2000 by Corinna Capra, RN  Device Skin Pressure Protection:   adhesive use limited   pressure points protected   positioning supports utilized  Skin Protection: adhesive use limited  Intervention: Prevent Infection  Recent Flowsheet Documentation  Taken 08/07/2022 2000 by Corinna Capra, RN  Infection Prevention: hand hygiene promoted  Goal: Optimal Comfort and Wellbeing  Outcome: Progressing     Problem: Wound  Goal: Optimal Functional Ability  Outcome: Progressing  Goal: Optimal Pain Control and Function  Outcome: Progressing  Goal: Skin Health and Integrity  Intervention: Optimize Skin Protection  Recent Flowsheet Documentation  Taken 08/07/2022 2000 by Corinna Capra, RN  Pressure Reduction Techniques: frequent weight shift encouraged  Pressure Reduction Devices: pressure-redistributing mattress utilized  Skin Protection: adhesive use limited

## 2022-08-11 NOTE — Unmapped (Signed)
[facility] Refill Coordination Note    Specialty Medication(s) to be Shipped:   Inflammatory Disorders: Humira    Other medication(s) to be shipped: No additional medications requested for fill at this time     Jerome Kennedy., DOB: Jul 27, 1976  Phone: [phone] (home)       All above HIPAA information was verified with patient.     Was a Nurse, learning disability used for this call? No    Completed refill call assessment today to schedule patient's medication shipment from the [facility] [phone]).  All relevant notes have been reviewed.     Specialty medication(s) and dose(s) confirmed: Regimen is correct and unchanged.   Changes to medications: Jerome Kennedy reports no changes at this time.  Changes to insurance: No  New side effects reported not previously addressed with a pharmacist or physician: None reported  Questions for the pharmacist: No    Confirmed patient received a Conservation officer, historic buildings and a Surveyor, mining with first shipment. The patient will receive a drug information handout for each medication shipped and additional FDA Medication Guides as required.       DISEASE/MEDICATION-SPECIFIC INFORMATION        For patients on injectable medications: Patient currently has 0 doses left.  Next injection is scheduled for 1/14.    SPECIALTY MEDICATION ADHERENCE     Medication Adherence    Patient reported X missed doses in the last month: 0  Specialty Medication: HUMIRA(CF) PEN 40 mg/0.4 mL  Patient is on additional specialty medications: No                          Were doses missed due to medication being on hold? No    Humira 40/0.4 mg/ml: 0 days of medicine on hand        REFERRAL TO PHARMACIST     Referral to the pharmacist: Not needed      [facility]     Shipping address confirmed in Epic.     Delivery Scheduled: Yes, Expected medication delivery date: 08/14/22.     Medication will be delivered via Same Day Courier to the prescription address in Epic WAM.    [name] [facility] Specialty Technician

## 2022-08-11 NOTE — Unmapped (Signed)
Addendum  created 08/11/22 1026 by Katrine Coho, MD    Clinical Note Signed, Intraprocedure Event edited

## 2022-08-13 NOTE — Unmapped (Signed)
Auth in place, scheduled for 2/5/204 infusion.

## 2022-08-14 MED FILL — HUMIRA PEN CITRATE FREE 40 MG/0.4 ML: SUBCUTANEOUS | 28 days supply | Qty: 4 | Fill #1

## 2022-08-17 ENCOUNTER — Emergency Department: Admit: 2022-08-17 | Discharge: 2022-08-17 | Disposition: A | Discharge status: Home / Self Care | Payer: MEDICAID

## 2022-08-17 ENCOUNTER — Ambulatory Visit: Admit: 2022-08-17 | Discharge: 2022-08-17 | Disposition: A | Discharge status: Home / Self Care | Payer: MEDICAID

## 2022-08-17 DIAGNOSIS — M25512 Pain in left shoulder: Principal | ICD-10-CM

## 2022-08-17 MED ADMIN — acetaminophen (TYLENOL) tablet 1,000 mg: 1000 mg | ORAL | @ 10:00:00 | Stop: 2022-08-17

## 2022-08-17 NOTE — Unmapped (Signed)
PT here with shoulder and arm pain

## 2022-08-17 NOTE — Unmapped (Addendum)
[facility]  Emergency Department Provider Note     ED Clinical Impression     Final diagnoses:   Acute pain of left shoulder (Primary)      HPI, Medical Decision Making, ED Course     HPI: 47 y.o. male with past medical history of anemia, anxiety, bipolar disorder, Crohn's disease, previous SBO, who presents with concerns for left shoulder and arm pain in setting of mechanical fall.  Patient reports that he was walking around earlier today when he actually tripped and landed on his left shoulder.  There was no head strike, he did not lose consciousness, and he is not anticoagulated.  He is roughly 5/10 pain across his left shoulder.  Denies any weakness/numbness/tingling.  He is neurovascularly intact.    His physical exam is notable for mild tenderness palpation along his left shoulder.  He also has a cast along the left forearm in setting of a previous MCP surgery on the fourth of this month, roughly 10 days ago.    DDx/MDM: Plan to obtain x-ray of shoulder and forearm to evaluate for any acute fracture or dislocation.  Pain control with Tylenol.  Anticipate discharge if workup reassuring.    ED Course  ED Course as of 08/17/22 0611   Wynelle Link Aug 17, 2022   0608 XR Shoulder 3 Or More Views Left  Independently interpreted, no acute fracture or dislocation.   1610 XR Forearm Left  Independently interpreted, no acute fracture or dislocation.   9604 Reevaluated patient, he reports feeling improved after administration of Tylenol.      Patient expressed understanding of negative x-rays and plan for discharge and return precautions.  Patient is okay for discharge.       Discussion of Management with other Physicians, QHP or Appropriate Source: None  Independent Interpretation of Studies: If applicable, documented in ED course above.  I have reviewed recent and relevant previous record, including: Outpatient notes - op note from 08/07/2022 to review past medical history          ____________________________________________    The case was discussed with the attending physician, who is in agreement with the above assessment and plan.     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    Myalgia     SBO (small bowel obstruction) (CMS-HCC) 2011       Past Surgical History:   Procedure Laterality Date    CHOLECYSTECTOMY      COLON SURGERY      partial resection    HERNIA REPAIR      PR CLOSE ENTEROSTOMY,RESEC+COLOREC ANAS Midline 11/01/2019    Procedure: CLO ENTEROSTOMY; W/RESECT COLORECTAL ANASTOM;  Surgeon: Claretta Fraise, MD;  Location: MAIN OR [place];  Service: Gastrointestinal    PR COLONOSCOPY FLX DX W/COLLJ SPEC WHEN PFRMD Left 01/07/2013  Procedure: COLONOSCOPY, FLEXIBLE, PROXIMAL TO SPLENIC FLEXURE; DIAGNOSTIC, W/WO COLLECTION SPECIMEN BY BRUSH OR WASH;  Surgeon: Tish Men, MD;  Location: [facility] PROC[facility]URES [facility];  Service: Gastroenterology    PR COLONOSCOPY FLX DX W/COLLJ SPEC WHEN PFRMD  03/09/2015    Procedure: COLONOSCOPY, FLEXIBLE, PROXIMAL TO SPLENIC FLEXURE; DIAGNOSTIC, W/WO COLLECTION SPECIMEN BY BRUSH OR WASH;  Surgeon: Cletis Athens, MD;  Location: GI PROCEDURES [facility];  Service: Gastroenterology    PR COLONOSCOPY FLX DX W/COLLJ SPEC WHEN PFRMD N/A 10/28/2019    Procedure: COLONOSCOPY, FLEXIBLE, PROXIMAL TO SPLENIC FLEXURE; DIAGNOSTIC, W/WO COLLECTION SPECIMEN BY BRUSH OR WASH;  Surgeon: Beverly Milch, MD;  Location: GI PROCEDURES [facility];  Service: Gastroenterology    PR EXPLORATORY OF ABDOMEN Midline 09/02/2019    Procedure: EXPLORATORY LAPAROTOMY, EXPLORATORY CELIOTOMY WITH OR WITHOUT BIOPSY(S);  Surgeon: Claretta Fraise, MD;  Location: MAIN OR [facility];  Service: Gastrointestinal    PR EXPLORATORY OF ABDOMEN Midline 11/01/2019    Procedure: EXPLORATORY LAPAROTOMY, EXPLORATORY CELIOTOMY WITH OR WITHOUT BIOPSY(S);  Surgeon: Claretta Fraise, MD;  Location: MAIN OR [facility];  Service: Gastrointestinal    PR FIX FINGER,VOLAR PLATE,I-P JT Left 08/07/2022    Procedure: REPAIR AND RECONSTRUCTION, FINGER, VOLAR PLATE, INTERPHALANGEAL JOINT;  Surgeon: [name], MD;  Location: MAIN OR [facility];  Service: Orthopedics    PR FREEING BOWEL ADHESION,ENTEROLYSIS N/A 11/01/2019    Procedure: Enterolysis (Separt Proc);  Surgeon: Claretta Fraise, MD;  Location: MAIN OR [facility];  Service: Gastrointestinal    PR ILEOSCOPY THRU STOMA,BIOPSY N/A 10/14/2019    Procedure: Karren Cobble; [place] Saa 1/MX;  Surgeon: Neysa Hotter, MD;  Location: [facility];  Service: Gastroenterology    PR PART REMOVAL COLON W COLOSTOMY Midline 09/02/2019    Procedure: COLECTOMY, PARTIAL; WITH SKIN LEVEL CECOSTOMY OR COLOSTOMY;  Surgeon: Claretta Fraise, MD;  Location: MAIN OR [facility];  Service: Gastrointestinal    PR REPAIR INCISIONAL HERNIA,REDUCIBLE Midline 09/02/2019    Procedure: REPAIR INIT INCISIONAL OR VENTRAL HERNIA; REDUCIBLE;  Surgeon: Kristopher Oppenheim, MD;  Location: MAIN OR [place];  Service: Trauma    PR REPAIR INTERCARP/CARP-METACARP JT Left 08/07/2022    Procedure: Jorene Guest;  Surgeon: [name], MD;  Location: MAIN OR [facility];  Service: Orthopedics    PR UPPER GI ENDOSCOPY,BIOPSY N/A 01/07/2013    Procedure: UGI ENDOSCOPY; WITH BIOPSY, SINGLE OR MULTIPLE;  Surgeon: Tish Men, MD;  Location: [facility]URES [facility];  Service: Gastroenterology    PR UPPER GI ENDOSCOPY,BIOPSY N/A 10/14/2019    Procedure: UGI ENDOSCOPY; WITH BIOPSY, SINGLE OR MULTIPLE;  Surgeon: Neysa Hotter, MD;  Location: GI PROCEDURES [facility];  Service: Gastroenterology       No current facility-administered medications for this encounter.    Current Outpatient Medications:     ADALIMUMAB PEN CITRATE FREE 40 MG/0.4 ML, Inject the contents of 1 pen (40 mg total) under the skin every seven (7) days., Disp: 4 each, Rfl: 5    ascorbic acid, vitamin C, (VITAMIN C) 250 MG tablet, Take 1 tablet (250 mg total) by mouth every other day. Take with oral iron, Disp: 30 tablet, Rfl: 11    B complex vitamins (B COMPLEX-VITAMIN B12) tablet, Take 1 tablet by mouth daily., Disp: 30 tablet, Rfl: 11    budesonide-formoterol (SYMBICORT) 80-4.5 mcg/actuation inhaler, Inhale 2 puffs two (2) times a day. (Patient not taking: Reported on 08/07/2022), Disp: , Rfl:     calcium carbonate (TUMS) 200 mg calcium (500 mg) chewable  tablet, Chew 2 tablets (400 mg of elem calcium total) two (2) times a day., Disp: 60 tablet, Rfl: 0    cholecalciferol, vitamin D3 25 mcg, 1,000 units,, 1,000 unit (25 mcg) tablet, Take 1 tablet (25 mcg total) by mouth daily., Disp: 30 tablet, Rfl: 0    empty container Misc, Use as directed to dispose of Humira, Disp: 1 each, Rfl: 3    ferrous sulfate 325 (65 FE) MG tablet, Take 1 tablet (325 mg total) by mouth every other day., Disp: , Rfl: 0    fluticasone propionate (FLONASE) 50 mcg/actuation nasal spray, 1 spray into each nostril every morning., Disp: , Rfl:     magnesium oxide (MAG-OX) 400 mg (241.3 mg elemental magnesium) tablet, Take 1 tablet (400 mg total) by mouth two (2) times a day., Disp: 60 tablet, Rfl: 0    nicotine (NICODERM CQ) 21 mg/24 hr patch, Place 1 patch on the skin daily., Disp: 28 patch, Rfl: 0    nicotine polacrilex (NICORETTE) 4 MG gum, Apply 1 each (4 mg total) to cheek every two (2) hours as needed for smoking cessation., Disp: 110 each, Rfl: 0    pantoprazole (PROTONIX) 40 MG tablet, Take 1 tablet (40 mg total) by mouth daily., Disp: , Rfl:     QUEtiapine (SEROQUEL) 100 MG tablet, Take 1 tablet (100 mg total) by mouth nightly., Disp: , Rfl:     Allergies  Morphine and Toradol [ketorolac]    Family History  Family History   Problem Relation Age of Onset    Diabetes Maternal Aunt     Crohn's disease Neg Hx        Social History  Social History     Tobacco Use    Smoking status: Former     Current packs/day: 0.00     Types: Cigarettes     Quit date: 10/02/2001     Years since quitting: 20.8    Smokeless tobacco: Never    Tobacco comments:     Motivated to quit. Smokes 0.5ppd but relights each cig   Vaping Use    Vaping Use: Never used   Substance Use Topics    Alcohol use: No     Alcohol/week: 0.0 standard drinks of alcohol    Drug use: Yes     Types: Marijuana     Comment: Denies currently, once a month        Physical Exam     VITAL SIGNS:      Vitals:    08/17/22 0302 08/17/22 0308 08/17/22 0310   BP:  127/78    Pulse: 70 74    Resp: 14 16    Temp:   36.8 ??C (98.2 ??F)   TempSrc:   Oral   SpO2: 98% 98%      Constitutional: Alert and oriented. No acute distress.  Eyes: Conjunctivae are normal.  HEENT: Normocephalic and atraumatic. Conjunctivae clear. No congestion. Moist mucous membranes.   Cardiovascular: Rate as above, regular rhythm. Normal and symmetric distal pulses. Brisk capillary refill. Normal skin turgor.  Respiratory: Normal respiratory effort. Breath sounds are normal. There are no wheezing or crackles heard.  Gastrointestinal: Soft, non-distended, non-tender.  Genitourinary: Deferred.  Musculoskeletal: Non-tender with normal range of motion in all extremities with exception of left shoulder, mild tenderness palpation along left shoulder.  Neurologic: Normal speech and language. No gross focal neurologic deficits are appreciated. Patient is moving all extremities equally, face is symmetric at rest and with speech.  Skin: Skin is  warm, dry and intact. No rash noted.  Psychiatric: Mood and affect are normal. Speech and behavior are normal.     Radiology     XR Shoulder 3 Or More Views Left   Preliminary Result   No acute fracture or dislocation of the left shoulder. Slight superior subluxation of the humerus relative the glenoid which can be seen in chronic rotator cuff pathology.      XR Forearm Left   Preliminary Result   No acute fracture of the radius or ulna. Malalignment of the left wrist with volar tilt of the lunate. If there is clinical suspicion for injury at the wrist or elbow joint, recommend dedicated imaging.          Pertinent labs & imaging results that were available during my care of the patient were independently interpreted by me and considered in my medical decision making (see chart for details).    Portions of this record have been created using Scientist, clinical (histocompatibility and immunogenetics). Dictation errors have been sought, but may not have been identified and corrected.         Jesse Fall, MD  Resident  08/17/22 1610       Jesse Fall, MD  Resident  08/17/22 [phone]

## 2022-08-20 ENCOUNTER — Ambulatory Visit
Admit: 2022-08-20 | Payer: MEDICAID | Attending: Rehabilitative and Restorative Service Providers" | Primary: Rehabilitative and Restorative Service Providers"

## 2022-08-20 ENCOUNTER — Ambulatory Visit
Admit: 2022-08-20 | Discharge: 2022-08-21 | Payer: MEDICAID | Attending: Plastic and Reconstructive Surgery | Primary: Plastic and Reconstructive Surgery

## 2022-08-20 ENCOUNTER — Ambulatory Visit: Admit: 2022-08-20 | Discharge: 2022-09-18 | Payer: MEDICAID

## 2022-08-20 NOTE — Unmapped (Signed)
Outpatient Social Work  [facility]  Initial Assessment      Referral Source & Reason for Contact:  Reason for Referral: Patient homeless  Referring Provider: Linwood Dibbles, OT / Barbaraann Cao, ATC  Date of Referral: 08/20/22    CSW spoke with patient F2F in-clinic 1/17/24and completed an initial assessment.     Biopsychosocial History:  Jerome Dirickson. is a 47 y.o. male who resides at the [facility] mens shelter in [place]. CSW discussed their role and availability in supporting Pt during Pt's orthopaedic care journey.    Social Support/Living Arrangement  Patient endorsed he resides at the mens [facility] shelter in [place]. Patient stated he feel supported by family in [place] and [place]. Patient stated he believes he has a cousin in [place], [place] but hasn't yet connected with her. Patient stated he is transferring [facility] to [facility] and he is looking for income-based housing as soon as possible.     Barriers to Care  Patient stated he does not know how to read but can read phone numbers and make calls.     Social Work Notes:  Patient endorsed he uses [employer] to get to medical appointments. CSW discussed with patient also has medicaid transportation. CSW provided contact information for medicaid transportation. Patient endorsed interest in food pantries in [place]. Information was provided and telephone numbers were circled. Patient endorsed he receives $76/month in FNS.     Patient [place] due to his medical providers all being at [facility] now. CSW recommended patient establish care at [facility] due to MDT team there available to support patient's. Patient stated he is currently going to a community clinic in [place] but would like to being care at [facility]. CSW coordinated with FM CM team Re outreach to patient given reading barriers. Patient will be contacted to schedule a new patient appointment.     Patient was appreciative of information and receptive to resources/support.     CSW provided the following resources to patient in-clinic with appropriate telephone numbers highlighted and circled:    Transportation Resource By [facility] (age 71+): [phone]  [facility] Transit E[employer]: [phone]  [place] Transit: [phone]  Medicaid Transportation: [phone]  During COVID: [facility] Transportation is running as normal for medical transportation (hours are 8 PM to 5 PM). EZ Rider: EZ rider will operate under reduced hours to help slow the spread of COVID-19 in our community. EZ Nolon Bussing will provide Saturday-level service Monday through Saturday (Hours during COVID: Monday - Saturday: 8:15 AM - 6:52 AM, Sunday: Premium Service, 10 AM - 4:00 PM). [place] Transit: Reduced hours/service during COVID, see https://houston.com/ for more details.        Housing Resources   Housing Search: www.nchousingsearch.C[employer] Affordable Housing List: http://keith.biz/    [employer] Emergency Housing Assistance  The [facility] Emergency Housing Assistance program connects people in housing crisis with housing resources in [place].  Availability and Contact Information:  Monday-Friday 10am-4pm  Or leave a voicemail/send an email any time  Housing Helpline Phone:  [phone]  Housing Helpline Email:  housinghelp@orangecountync .gov    [employer] Partnership to [employer] Homelessness Resource Guide: PaidValue.com.cy  Housing/Utility Assistance  Department of Social Services - [facility]   [address]  [place], [place] [address]  Phone: [phone]  Hours: M-F 8a-5p  Offers the Crisis Intervention Program (CIP) which provides financial help with utility bills for heating or cooling emergencies. Payments are made to the vendor. Help is available more than once every 12 months. Must meet income limits and be in a crisis.   Offers the AT&T Program [place]) which provides financial help with heating bills once every 12 months. Payments are based on household size, income, and heating source. Payments are made to the vendor. Each household is only eligible once, even if multiple members apply. Call to see if funds are available.      Department of Social Services - [facility]   [address], [place], [place] [address]  Phone: [phone]  Hours: M-F 8a-5p  Walk in to complete an application, call to have one mailed, or apply online.   Offers the [employer] Program (CIP) which provides financial help with utility bills for heating or cooling emergencies. Payments are made to the vendor. Help is available more than once every 12 months. Must meet income limits and be in a crisis.   Offers the AT&T Program [place]) which provides financial help with heating bills once every 12 months. Payments are based on household size, income, and heating source. Payments are made to the vendor. Each household is only eligible once, even if multiple members apply. Call to see if funds are available.      [facility] [place] [place], and [employer]  [address], [place], [place] [address]  Phone: [phone] ext. [phone]  Hours: M-F 8a-12p, 1p-4p  Offers emergency financial help with electric and heating utilities. Not able to help with deposits. May offer household goods for people involved in natural disasters or house fires. Must meet income limits and be in a crisis situation. Financial help can only be given once every 12 months. Priority is given to homes with minors or people with health conditions. Appointment only.     Housing for [facility]   [address] [place], [place], [place] [address]  Phone: [phone]  Hours: M-F 8:30-5p  Offers help to families and individuals who are homeless return to permanent housing. Offers housing search assistance and landlord mediation. Also offers financial help for rent and utilities. Eligible to individuals and families who are homeless.    [facility]  [address], [place], [place] [address]  Phone: [phone]  Hours: M-F 8:30a-5p  Offers emergency financial help for households facing disconnection of utilities (electric, water, gas, etc.) Open to all.     [employer] for Social Service  [address] [place], [place] [address]  Phone: [phone] ext. [phone]  Hours: M-F 9a-5p  Offers financial help with clothing, medication expenses, rent, and utilities. Also offers help obtaining IDs, and transportation for anyone experiencing a crisis. Open to all in a crisis situation that could lead to homelessness. Call on Tuesday beginning at 9am to schedule an appointment.     [facility] [place] [facility] [place]   [address] [place], [place] [address]  Phone: [phone]  Office: Monday-Thursday 9a-5p; Friday 9a-3p  Offer Emergency Financial Assistance to people in a financial crisis. Emergency Financial Assistance is considered on January 1st, May 1st, August 1st, and October 1st. Visit website (www.jewishforgood.org) for online form.      [employer] in [place]   [address] [place], [place] [address]  Phone: [phone] ext. [phone]  Hours: M-F 9a-5p  Offers a Programme researcher, broadcasting/film/video. Also offers financial help with past-due rent and water bills. Limited funding available. For residents of [facility] (NOT [place] or [place]). Call to apply.    Shelters   [employer] [employer] [employer] Nonprofit Homeless Shelter  Services: provides safe shelter, three meals a day, clean clothing, Biblical counseling, financial planning, vocational training, GED/education and employment assistance  Locations:  Homeless Shelter for Men: Men's Division  Phone: [phone] ext. [address]  [address] [place], [place] [address]  Homeless Shelter for Women and Mothers with Children: [place]  Phone: [phone] ext. [phone]  [address] [place], [place] [address]    [facility]   [address] [place], [place] [address]   Phone: [phone]   Emergency and temporary housing program for families with young children experiencing homelessness. Personalized services for families and supportive educational programs for children and teenagers are available. Anyone in need of emergency shelter must first report to [facility] either in person or at the number below for a coordinated entry and diversion intake. There is a four-step process to be referred for shelter services.  Entry [facility] Phone Number: [phone]  Address: [address] (Aging and Adult Services) [address] [place], [place] [address]    [facility]   [address], [place]   Phone: [phone]   If you are experiencing homelessness, go the [employer] (located within the [employer]) on Mondays at 10am to reserve a bed for 2 weeks. The [employer] also provides free meals to anyone in need.   Designated mealtimes are:   Monday-Friday breakfast & brown-bag lunch pickup 8-9am, dinner 6-7pm  Saturday, Sunday and Holidays breakfast 9:30-10:30am, lunch 12:30-1:30pm, dinner 6-7pm   For more information on shelter availability call [facility] for a coordinated entry and diversion intake at [phone] to set up an intake by phone or visit them at [facility] [address] (Aging and Adult Services) [address] [place], [place] [address]    [facility]  [address] [place], [place] [address]  Phone: [phone]    Inter Faith Council Services  [facility]   24-hour Residential Men-only emergency and longer term shelter, healthcare, emergency financial assistance, job mentoring  [address] [place], [place] [address]   Phone: [phone]   Website: http://www.parker.com/.Engineer, production  24-hour emergency and longer-term shelter for women and children  [address] [place], [place] [address]  Phone: [phone]  Website: http://www.parker.com/.html  Free Meals through [employer]   located at [address] [place], [place] [address]  Phone: [phone]  Monday-Friday Lunch 11:15 am-12:30 pm, Dinner 6:15 pm-7:00 pm  Saturday Lunch 11:15 am-12:30 pm  Sunday Lunch 12:15 pm-1:30 pm      [facility]   [address] [place], [place] [address]   Phone: [phone]   Website: http://www.raleighrescue.org   Longer term recovery & rehabilitation programs for single men, single women, and women with children. Provides meals, clothine, hygiene, healthcare, counseling, parenting classes, transportation, and career training through the new life plan, usually taking about a year. Will assist clients looking for short-term or emergency shelter by working with their Coordinated Entry Partners in [facility], but will NOT provide emergency shelter.   For more information on available emergency and short-term shelters in [facility], call [facility] Coordinated Intake Line at [phone].    Safe [place]  of [facility]   The Jumble Store: [address] [place], [place] [address]   Sexual Assault Office: [address] [place], [place] [address]  Phone: [phone]   24/7 Crisis Line: [phone]  Crisis intervention services, domestic violence resources for individuals and their children, residential and/or non-residential services    Subsidized Housing Programs   [facility] Department of Housing   [address] [place], [place] [address]   Phone: [phone]   Public housing for rent-burdened families, the elderly, the homeless, or those involuntarily displaced and in need of assistance     Community Alternatives for Supportive Abodes (CASA)   [address] [place], [place] [address]   Phone: [phone]   Office Hours: Monday-Friday 8:30 am through 5:00 pm  Housing development, supportive housing, rental assistance and/or rental housing management for those in poverty or with disabilities and allows individuals to renew [employer] opportunities, bank accounts, one-on-one employment assistance, housing assistance, financial education, and connection to other needed services.  [employer] Office: [address], [place],   [place] Ph: [phone]   [facility] Espa??ol: [phone]   Accessible from most  [facility] Transit routes M-F 9am-5pm, Thursday 5pm-7pm  [facility] Office: [address] [place], [address], [place], [place], [address]   [facility] Ph: [phone]   Accessible from 10 & 10B bus line, stop 5190    [employer]   [address] [place], [place] [address]   Phone: [phone]   Website: http://www.empowerment-inc.org   Provides affordable housing rentals, emergency utility funds, and housing counseling services.     [facility]   [address] [place], [place] [address]   Phone: [phone]   A network of local churches that assists families with paying for housing and manages apartment complexes that are subsidized     [facility] [employer]   [address] [place], [place]   Phone: [phone]   Website:http://www.co.orange.[place].us/1098/Section-8-Housing-Choice-Voucher-Program   Administers Section 8 housing choice vouchers for families to find suitable housing units    [facility]  [address] [place], [place] [address]  Phone: [phone]  Fax: [phone]    [facility]   [address] [place], [place] [address]  Phone: [phone]  Website: https://kerr-hamilton.com/   Affordable home ownership for low income individuals. Also assists with property management and education related to finances and home-ownership.      Subsidized Rentals   [facility]st [facility]   [address] [place], [place] [address]   Phone: [phone]   Eligibility: 62+   Affordable housing units with social activities and volunteer opportunities for senior citizens    [place] [facility]   [address] [place], [place] [address]   Phone: [phone]   Eligibility: 62+ or physically handicapped  Affordable apartments for rent    Housing for [facility]   [address], [place], [place] [address]   Office Phone: [phone]   Services:  Rapid Rehousing: resources and services provided to help individuals and families to quickly exit homelessness and return to permanent housing. If you are an individual or family facing homelessness in [place], contact the Coordinated Intake Office by calling [phone]  Permanent Supportive Housing: provides non-time-limited affordable housing assistance to those who are disabled and  have experienced chronic homelessness Contact [phone] for more information  Affordable Housing: [address]ments and [place] housing communities for individuals and families    [place]   [address] [place], [place] [address]   Phone: [phone]   Eligibility: 55+, also accepts Section 8 Housing Vouchers     [facility]   [address], [place], [place] [address]   Phone: [phone]   Housing through [facility] Housing Tax [facility]   [address], [place], [place] [address]   Phone: [phone]   Low-income apartments    [employer] and Services   Office: [address] [place], [place] [address]  Phone: [phone]  Continuing Care Retirement Communities and [place] Housing in multiple locations across [place] and [place] including [place], [place], [place], [place], [place], [place], [place], [place], and [place]  Eligibility: 62+   Visit https://www.uchas.org for more information    [facility] area   [address] [place], [place] [address]  [phone]  Cooperative Housing Community to provide housing for low-income individuals and families  Website: https://www.wcha.coop       Rehabilitation Housing Programs  [place]   [address] [place], [place] [address]   Phone: [phone]  Hours: Monday-Friday 8am to 4pm   Website: http://www.caramore.org   Eligibility: Individuals living with severe and persistent mental illness. Patients typically spend up to one year in the program.  Services:  [employer] to provide participants with immediate and paid employment  Peer Support  Supervised Living Low to provide safety net for individuals working towards complete independent living    [facility]   [address] [place], [place] [address]   Phone: [phone]   Crisis Phone Number  [place]: [phone]  Erskine Emery and Person [place]: [phone]  Website: https://freedomhouserecovery.org  Eligibility: For children and adults who are experiencing behavioral issues, mental illness, or addiction. Half-Way Housing in [place], [place], and [place]. Up to six months of residential treatment.     [facility]   A Self-Run Residential Structure to help those struggling with substance abuse  Phone: [phone]   Website: http://oxfordhousecw.com  To explore house vacancy, go to www.oxfordvacancies.com  Eligibility: For individuals experiencing a substance use disorder. Residents must maintain abstinence and be 14 days or more drug and alcohol free at time of application. Also must be willing to pay their share of the expenses    [facility] residential program    [address] [place], [place] [address]   Phone: [phone]  Website: CreditEducate.co.uk  Eligibility: For individuals 18 years or older experiencing a substance use disorder. Highly structured, 2-year residential treatment.       [place] FOOD PANTRIES/EMERGENCY FOOD RESOURCES    To learn about whether you may be eligible for Meals on Wheels in [place], [place] please call [phone].    To learn about whether you may be eligible for Meals on Wheels in [facility] please call [phone].    To learn about whether you qualify for Food and Nutrition Services, or Food Stamps, you can apply online with ePass using this link: https://epass.kabucove.com , apply in person at your local department of social services by calling [phone] for [facility], or complete a paper application using this link: https://files.AMRegister.tn.pdf and drop it off at your local Department of Social Services(DSS) located at in your  county DSS. [facility] is located at [address], [place], [place] [address].      Food Distribution in [place], [place] (Updated 09/06/2019)  Comprehensive document of food resources in [place].  InvestmentBrowse.at    Food Not Bombs [address] [place], [place], & [place])   Offering non-perishable food, produce, Armed forces training and education officer, pet, and basic household items. Use the following link to complete form (accessible in [place] & Spanish). Delivery options available.  https://docs.google.com/forms/d/e/[email or web address]/viewform    Librarian, academic   Delivery grocery service offers to [facility] residents 60 years and older. To register for this program, complete the online request form or call the Aging Helpline.   Online Request Form: https://airtable.com/[email or web address]  Aging Helpline: [phone])[phone]    [facility]  [address] [place], [place], [place] [address]  Hours: Saturdays from 9a-12p  Accepts SNAP/EBT. Many vendors offer preordering, pickup, and home delivery. Visit link for more information: DemocraticPeople.com.cy    [facility] Food Bank  Offers a bag of foods and canned goods every 3rd Friday from 10am-3pm at [facility] - [address] [place]  Visit website for more information and application: https://www.renacommunitycenter.com/esl    Lunch for adults age 15 and older:   Pickup boxed lunch 3 times a week - MWF (enough food for 5 meals) Must be eligible and an [facility] resident.  Call [phone] to enroll. Only for seniors.    Food Bank at [employer] and [employer] on Wednesday from 10-11:30am at [facility] and Ride Lot.   Directions: from [facility] Albina Billet. Turn onto [employer]. Go 0.6 miles, just past [address], and turn into the Park-and-Ride Lot.   Open to all.     Food Bank at [facility]  Every 2nd Thursday from 9:30-11am at [facility] - [address] in [place]  Open to all.     Food Bank at [employer]  Every 4th Thursday from 9:30-11am at [facility] - [address]  Open to all.     [employer]  Every 1st, 3rd, and 5th Saturday of the month from 11a-12p at [facility] - [address] [place], [place] Parking Lot. A joint ministry of [address] and [address]. 2 Bags of Food.   Open to all.     [facility] The [employer] Pick-up   Monday-Friday at various times. Provided by [facility] Cendant Corporation and Dillard's. Meals can be picked up from any one of 17 locations. For children ages 29-10 years old.   See website for more information: [email or web address].ca.aspx?PageType=3&DomainID=4&ModuleInstanceID=30&ViewID=[email or web address]&RenderLoc=0&FlexDataID=[id number]&PageID=1    SHEETZ  Free daily kids meals, while supplies last. The local participating station is [address], [place], [place] [address]  Only for children.     Inter-Faith Council (IFC) [employer] Br[employer] for pickup daily at [address] [place]. Monday to Saturday 11:15-12:30pm and Sunday 12:15-1:30pm  Open to all.    Inter-Faith Council (IFC) Dinner  [employer] for pickup, Monday-Friday 6:15-7pm at [address] [place]  Open to all.    Inter-Faith Council ([employer]) Groceries  Provides any household with a week's worth of groceries once per month.   Call [phone], 9a-5p to make appointment.  Open to all.    TABLE  Home-based delivery of bags of healthy non-perisables and fresh food to families with children in the [facility] system.   To request food  visit: https://www.page-knight.com/    [facility]  [place] food program for families with children in the [facility] Cendant Corporation. If you have children who currently attend school in [place] or [place], contact chc@porchcommunities .org to ask about enrollment options    [employer] Box Program  Distributes a box of non-perishable groceries once a month to qualified [facility] residents who are over 60. To enroll email shmcpherson@orangecountync .gov or call 9895819630.      ADDITIONAL FOOD RESOURCES    Food Bank of Central and [facility]   [address] [place], [place] [address]  Phone: [phone]     Food Stamps [facility])   Contact your local DSS office      Interfaith Food Shuttle   [address] [place], [place] [address]     Phone: [phone]     211/UNITED WAY Helpline   Phone: [phone] or [phone]        [facility] Financial Assistance Opportunities  The [facility] Financial Navigator at the [facility] (Orthopaedics)  Financial Navigator: Sheila Oats [phone]. Please call Mr. Jimmey Ralph if you have questions about appointment billing, insurance coverage, to apply for Medicaid or [facility] Financial Assistance, or for any other insurance related questions. You may stop by to see him in the clinic Monday-Friday 8:00am-4:30pm or call his office number.     [place] Financial Assistance  The [employer] Program insures that all eligible individuals receive medically necessary care at participating [facility] entities regardless of their ability to pay. The program is available for patients who are [place] residents with a household income of at or below 250% of the Federal Poverty Guideline for their family size [phone] for a family of four).  Health also offers a 40% uninsured discount.   All patients approved for [facility] are asked to pay copayments of at least $10 per clinic visit or lab or x-ray, $50 per emergency department visit, $75 per ambulatory surgery visit and $100 per inpatient admission. If you have insurance in addition to [facility], the copayment applied by your insurance company will be expected instead of the [facility] copayment.  For questions, call the Financial Assistance Unit at [phone] or visit the Financial Counselor located on the third floor of the Ambulatory Care Clinic [facility]).  More details can be found at the [facility] Website: [email or web address]    [facility] Pharmacy Assistance  Pharmacy Assistance helps qualified patients in covering the cost of medications. Patients can be approved for the 14 day benefit, and/or submit a Pharmacy Assistance application for the full benefit. Patients may qualify for the PAP if they: Do not have insurance. Are a resident of [place]. Are being treated by a [facility] doctor in a [facility] clinic. Have low income (less than 200% of the Federal Poverty Level).  Counselors are available Monday - Friday, from 8am-4:30 pm to discuss the PAP program and answer your questions. They're located in the [facility], on the ground floor of the [facility], and also at the [facility]. You can also contact a Pharmacy Assistance Counselor by phone at [phone].     Other Financial Assistance  You may contact your local Department of Social Services agency (DSS), by looking up your county specific DSS agency and request to speak with an Intake worker to be screened for services you may be eligible for.            How to Find a Therapist  in [place]?    Medicaid/Medicare Insurance Plans  If you have Medicaid, Medicare or no insurance, see below for your county's MCO. Call the phone number to schedule an appointment.      [facility]  [address], [address]  [place], [place] [address]  Phone: [phone]  Fax: [phone]  Crisis Line: [phone]  Counties Served: [place], [place], [place], [place], [place], [place], [place]     CSW provided an intervention for the [employer], [employer], Housing, and Transportation Needs SDOH domain. The intervention was Provided [employer], [facility], and [place] Management/Care Coordination.    Patient is currently insured with MEDICAID [place] .     Follow-Up Plan:  - Patient will schedule a new patient appointment with [facility].   - CSW will follow-up with patient in ~1 week via telephone.   - CSW available for support as needed. Patient was provided with my contact information and instructed to call for any psychosocial needs.       Raoul Pitch, MSW, LCSW-A  Clinical Social Worker   [facility] Orthopaedic Clinic  Phone: [phone]    ---------------------------------------------------------------------  Patient Information:  Name: Jerome Kennedy, Jerome Kennedy    MRN: [id number]  DOB: [birth year]  Address: [address] [place] [address]  Chaney BornErskine Emery  Primary phone: [phone]   Email: No e-mail address on record  PCP: Pcp, None Per Patient  ---------------------------------------------------------------------

## 2022-08-20 NOTE — Unmapped (Signed)
OUTPATIENT OCCUPATIONAL THERAPY    UPPER EXTREMITY EVALUATION    Patient Name: Jerome Kennedy.  Date of Birth:March 09, 1976  Date: 08/20/2022  Visit #: 1  Plan of Care Certification Dates:   Encounter Diagnoses   Name Primary?    Arthritis of carpometacarpal [facility]) joint of left thumb     Decreased range of motion of left thumb Yes   Reason for referral: Please coordinate same day, after post-op appointment with Dr. Gus Puma on 08/20/22 for splint fabrication. Will not start motion until week 4 to protect volar plate repair.    Referring Provider: [name]  Onset of Symptoms: sx 08/07/22  Per Referring Provider's note:   ASSESSMENT:  Antoin Mccullers. is a 47 y.o. male status post left trapeziectomy w/ suture suspensionplasty and volar plate advancement/repair of left thumb MCP joint.  Date of surgery: 08/07/22  PLAN:  We had a nice discussion with the patient in clinic today regarding their clinical course and findings. Sutures removed. Continue working with OT on ROM and splint wear. Continue lifting restrictions. RTC in 1 month.  Communication preference: verbal, written, visual  Prognosis: good due to motivation, health status     OT ASSESSMENT:   47 y.o. year old male with above diagnosis. Patient requires skilled Occupational Therapy services for decreased range of motion, decreased strength, orthotic fit/management, impaired daily activities of living as appropriate.     Previous Level of Function: Pt was previously independent with all ADLs and IADLs.  Pt had trouble holding most objects due to thumb pain.     CURRENT LEVEL OF FUNCTION    Social and Occupational: pt is on disability and lives at a homeless shelter and is getting into section 8 housing eventually - they ask him to do chores like mop; has Regulatory affairs officer for transportation; has assistant for dressing, bathing - typically has his own meals prepped any way     Score:  Scaling is ranked from 0 indicating least disability to 100 indicating most disability.     Moderate complexity: This patient demonstrates 3-5 performance deficits relating to physical, cognitive and psychosocial skills that result in activity limitations and/or participation restrictions.  This patient may have comorbidities affecting occupational performance.  Please refer to Current level of function section for further details.     Short Term Goals:  1. In 1 session, patient will perform home exercise program with need for cuing to max IND with ADLs and IADLs. (met)  2. In 1 session, patient will demonstrate independent donning and doffing of orthotic to max joint integrity necessary for ADL completion. (met)  3. In 1 session, patient will verbalize proper care of orthotic and skin to max joint integrity necessary for ADL completion. (met)    Long Term Goals:   1. In 12 weeks, patient will perform upgraded home exercise program, to include progression to strengthening, independently  to max IND with ADLs and IADLs.  2. In 12 weeks, pt will demo thumb to LF opposition to max his/her IND with shoe tying.  3. In 12 weeks, pt will demo lateral pinch strength greater than or equal to 9 lbs to max the ability to open a door.   4. In 12 weeks, pt will identify 1-2 pieces of AE to max IND with ADLs and IADLs.  5. In 12 weeks, pt will demo gross grip strength greater than or equal to 20 lbs to max the ability to open a door.     OT  PLAN OF CARE:  Pt will participate in:  Self Care/Hometraining  Orthotic Fit/Management   Therapeutic Exercise  Therapeutic Activity   Neuromuscular Re-education  Ultrasound  Hot/Cold Pack  Electrical Stimulation  Iontophoresis  Orthotic/Prosthetic Measure and Fit   Joint Mobilization  Physical Performance Measure   Manual Therapy    Planned frequency and duration of treatment: 1x / week/ 12 weeks. Plan will be adjusted as necessary.     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    Myalgia     SBO (small bowel obstruction) (CMS-HCC) 2011       Past Surgical History: Reviewed  Past Surgical History:   Procedure Laterality Date    CHOLECYSTECTOMY      COLON SURGERY      partial resection    HERNIA REPAIR      PR CLOSE ENTEROSTOMY,RESEC+COLOREC ANAS Midline 11/01/2019    Procedure: CLO ENTEROSTOMY; W/RESECT COLORECTAL ANASTOM;  Surgeon: Claretta Fraise, MD;  Location: MAIN OR [place];  Service: Gastrointestinal    PR COLONOSCOPY FLX DX W/COLLJ SPEC WHEN PFRMD Left 01/07/2013    Procedure: COLONOSCOPY, FLEXIBLE, PROXIMAL TO SPLENIC FLEXURE; DIAGNOSTIC, W/WO COLLECTION SPECIMEN BY BRUSH OR WASH;  Surgeon: Tish Men, MD;  Location: GI PROCEDURES [facility];  Service: Gastroenterology    PR COLONOSCOPY FLX DX W/COLLJ SPEC WHEN PFRMD  03/09/2015    Procedure: COLONOSCOPY, FLEXIBLE, PROXIMAL TO SPLENIC FLEXURE; DIAGNOSTIC, W/WO COLLECTION SPECIMEN BY BRUSH OR WASH;  Surgeon: Cletis Athens, MD;  Location: GI PROCEDURES [facility]  Service: Gastroenterology    PR COLONOSCOPY FLX DX W/COLLJ SPEC WHEN PFRMD N/A 10/28/2019    Procedure: COLONOSCOPY, FLEXIBLE, PROXIMAL TO SPLENIC FLEXURE; DIAGNOSTIC, W/WO COLLECTION SPECIMEN BY BRUSH OR WASH;  Surgeon: Beverly Milch, MD;  Location: GI PROCEDURES [facility];  Service: Gastroenterology    PR EXPLORATORY OF ABDOMEN Midline 09/02/2019    Procedure: EXPLORATORY LAPAROTOMY, EXPLORATORY CELIOTOMY WITH OR WITHOUT BIOPSY(S);  Surgeon: Claretta Fraise, MD;  Location: MAIN OR [place];  Service: Gastrointestinal    PR EXPLORATORY OF ABDOMEN Midline 11/01/2019    Procedure: EXPLORATORY LAPAROTOMY, EXPLORATORY CELIOTOMY WITH OR WITHOUT BIOPSY(S);  Surgeon: Claretta Fraise, MD;  Location: [facility] OR [facility];  Service: Gastrointestinal    PR FIX FINGER,VOLAR PLATE,I-P JT Left 08/07/2022    Procedure: REPAIR AND RECONSTRUCTION, FINGER, VOLAR PLATE, INTERPHALANGEAL JOINT;  Surgeon: [name], MD;  Location: MAIN OR [facility];  Service: Orthopedics    PR FREEING BOWEL ADHESION,ENTEROLYSIS N/A 11/01/2019    Procedure: Enterolysis (Separt Proc);  Surgeon: Claretta Fraise, MD;  Location: [facility];  Service: Gastrointestinal    PR ILEOSCOPY THRU STOMA,BIOPSY N/A 10/14/2019    Procedure: Karren Cobble; [place] Saa 1/MX;  Surgeon: Neysa Hotter, MD;  Location: [facility]URES [facility];  Service: Gastroenterology    PR PART  REMOVAL COLON W COLOSTOMY Midline 09/02/2019    Procedure: COLECTOMY, PARTIAL; WITH SKIN LEVEL CECOSTOMY OR COLOSTOMY;  Surgeon: Claretta Fraise, MD;  Location: MA[facility] OR [facility];  Service: Gastrointestinal    PR REPAIR INCISIONAL HERNIA,REDUCIBLE Midline 09/02/2019    Procedure: REPAIR INIT INCISIONAL OR VENTRAL HERNIA; REDUCIBLE;  Surgeon: Kristopher Oppenheim, MD;  Location: MAIN OR [place];  Service: Trauma    PR REPAIR INTERCARP/CARP-METACARP JT Left 08/07/2022    Procedure: Jorene Guest;  Surgeon: [name], MD;  Location: MAIN OR [facility];  Service: Orthopedics    PR UPPER GI ENDOSCOPY,BIOPSY N/A 01/07/2013    Procedure: UGI ENDOSCOPY; WITH BIOPSY, SINGLE OR MULTIPLE;  Surgeon: Tish Men, MD;  Location: GI PROCEDURES [facility];  Service: Gastroenterology    PR UPPER GI ENDOSCOPY,BIOPSY N/A 10/14/2019    Procedure: UGI ENDOSCOPY; WITH BIOPSY, SINGLE OR MULTIPLE;  Surgeon: Neysa Hotter, MD;  Location: GI PROCEDURES [facility];  Service: Gastroenterology       Allergies: Reviewed  Morphine and Toradol [ketorolac]    Medications: Reviewed    Current Outpatient Medications:     ADALIMUMAB PEN CITRATE FREE 40 MG/0.4 ML, Inject the contents of 1 pen (40 mg total) under the skin every seven (7) days., Disp: 4 each, Rfl: 5    ascorbic acid, vitamin C, (VITAMIN C) 250 MG tablet, Take 1 tablet (250 mg total) by mouth every other day. Take with oral iron, Disp: 30 tablet, Rfl: 11    B complex vitamins (B COMPLEX-VITAMIN B12) tablet, Take 1 tablet by mouth daily., Disp: 30 tablet, Rfl: 11    budesonide-formoterol (SYMBICORT) 80-4.5 mcg/actuation inhaler, Inhale 2 puffs two (2) times a day. (Patient not taking: Reported on 08/07/2022), Disp: , Rfl:     calcium carbonate (TUMS) 200 mg calcium (500 mg) chewable tablet, Chew 2 tablets (400 mg of elem calcium total) two (2) times a day., Disp: 60 tablet, Rfl: 0    cholecalciferol, vitamin D3 25 mcg, 1,000 units,, 1,000 unit (25 mcg) tablet, Take 1 tablet (25 mcg total) by mouth daily., Disp: 30 tablet, Rfl: 0    empty container Misc, Use as directed to dispose of Humira, Disp: 1 each, Rfl: 3    ferrous sulfate 325 (65 FE) MG tablet, Take 1 tablet (325 mg total) by mouth every other day., Disp: , Rfl: 0    fluticasone propionate (FLONASE) 50 mcg/actuation nasal spray, 1 spray into each nostril every morning., Disp: , Rfl:     magnesium oxide (MAG-OX) 400 mg (241.3 mg elemental magnesium) tablet, Take 1 tablet (400 mg total) by mouth two (2) times a day., Disp: 60 tablet, Rfl: 0    nicotine (NICODERM CQ) 21 mg/24 hr patch, Place 1 patch on the skin daily., Disp: 28 patch, Rfl: 0    nicotine polacrilex (NICORETTE) 4 MG gum, Apply 1 each (4 mg total) to cheek every two (2) hours as needed for smoking cessation., Disp: 110 each, Rfl: 0    pantoprazole (PROTONIX) 40 MG tablet, Take 1 tablet (40 mg total) by mouth daily., Disp: , Rfl:     QUEtiapine (SEROQUEL) 100 MG tablet, Take 1 tablet (100 mg total) by mouth nightly., Disp: , Rfl:     Precautions: No strengthening or weightbearing, orthosis on at all times except to perform change dressings,check skin, exercise    POST-OP HEALING TIMELINE FOR trapeziectomy w/ suture suspensionplasty and volar plate advancement/repair of left thumb MCP joint.     Date of Surgery: 08/07/22  week 2: 1/18: self passive ROM exercises are Initiated to the thumb and wrist 6 - 8 times a day for 10 minute sessions; ? The [facility] joint should be supported during self passive exercises.  week 3: 1/25  week 4: 2/1: Active and self passive ROM exercises are Initiated to the thumb and wrist 6 - 8 times a day for 10 minute sessions. Exercises should emphasize:   o palmar & radial abduction   o thumb circumduction   o flexion/ extension   o wrist flexion/extension   o wrist radial/ulnar deviation  ? The wrist and thumb static splint is worn between exercise sessions and at night for protection of the surgery and for comfort.   ? Scar management is initiated at 3 weeks. It is critical to emphasize scar mobilization as dense adhesions are common. Scar massage with lotion, scar retraction using a piece of dycem, and use of a scar remodeling product such as Rolyan 50/501M, OtoformK???, or Elastomerm are recommended.   ? Initiate manual desensitization techniques as the area is often hypersensitive along the susurgical site, as well as due to superficial sensory branch of radial nerve neuritis.  week 5: 2/8  week 6: 2/15: Unrestricted PROM exercises may be Initiated. Continue to support the [facility] joint ? On rare occasion, it becomes necessary to add dynamic flexion splinting for the MP and IP Joint of the thumb. Any dynamic splint must be form fitting and provide maximal support of the [facility] joint. 2 ? Continue with the wrist and thumb static splint between exercise sessions and at night ? Persistent and dense scars may benefit from ultrasound. The ultrasound can enhance the vasoelasticity of the soft tissues, thus increasing mobility.   week 7: 2/22  week 8: 2/29    Prior OT Service: no    Pain: 6/10 L thumb at rest     OBJECTIVE    Sensation: intact per pt report    Upper Extremity Function:    Shoulder:  WFL     Elbow:  WFL    Hand:   Pt is right hand dominant               AROM (degrees) Date:   Right Date:   Left   Wrist   extension/flexion     Supination/pronation     rad/uln deviation     Composite flex to [facility]   (cm lack)     Index     Middle     Ring     Small     Digit Extension     Thumb  Opposition        Gross grip strength (pounds)  Position 2 Male - age 47-49 R: 65-155 L: 58-160    Pinch strength (pounds)  Lateral  palmar       Wound/Incision(s) or Scar: incision covered with steri strips; no drainage    Edema: moderate in the thumb and digits. Educated pt on the need to perform active range of motion to allow adequate venous return and prevent stiffness.     Orthotic Fit/Management (20 min):  Fabricated forearm based thumb spica orthosis, with goal of protecting the healing repair.  Pt educated on splint wearing protocol, including at all times except to check skin and exercises.  Pt also educated on the need to perform frequent skin checks to ensure that there is no skin break down.  Pt educated on proper care of orthosis, including cleaning procedures and advising to stay away  from heat sources.  Pt advised that if there are any remaining questions or problems regarding  the orthosis, they are encouraged to contact us.  Educated pt on and demonstrated proper donning and doffing of the orthosis.    Moist heat applied to wrist/hand x 5 min (during orthotic fabrication) to prep tissue for increased extensibility in preparation for exercise.     Self Care/home training (18 min):  Issued HEP with patient demo (see below) with handouts provided to the patient.   Educated pt on weightbearing precautions and healing time-line.     Home Program:   Apply low to moderate heat 10 min prior to exercises for improved tissue extensibility  Tendon gliding  Passive thumb flexion with CMC supported    Therapist wore a mask for the entirety of the session.     I reviewed the no-show/attendance policy with the patient and caregiver(s). The family is aware that they must call to cancel appointments more than 24 hours in advance. They are also aware that if they late cancel or no-show three times, we reserve the right to cancel their remaining appointments. This policy is in place to allow [place] to best serve the needs of our caseload.    Treatment Rendered:   Orthotic Management/Training: 20 min  Self Care/Home Training: 18 min    Total Treatment Time: 38  Total Evaluation Time: 45 mins    Patient Education:  Topics: home program, disease process  Education Provided to: patient  Education Type: education, demonstration, literature  Response to education/teachback: verbal understanding received, return demonstration    I attest that I have reviewed the above information.  SignedLinwood Dibbles, OT  08/20/2022 12:44 PM

## 2022-08-20 NOTE — Unmapped (Signed)
Thank you for choosing [facility]!  We appreciate the opportunity to participate in your care.      If any questions or concerns arise after your visit, please do not hesitant to contact me by [place] MyChart or by calling my clinical support, Alyssa, at [phone].      Voicemail messages: Messages are checked between 8:00 am- 4:00 pm Monday- Friday.    MyChart messages: These messages are checked by the clinical staff during normal business hours 8:30 am-4:30 pm Monday-Friday every 24-48 hours and are for non-urgent, non-emergent concerns. You may be asked to return for a follow up visit if it is deemed your questions are best handled in the clinic setting. If you are not signed up on [place] MyChart, please call the [facility] [facility] at [phone] to receive help activating your account.    Please let me know if I can be of assistance with this or other orthopaedic issues in the future.

## 2022-08-20 NOTE — Unmapped (Signed)
ORTHOPAEDIC RETURN CLINIC NOTE     ASSESSMENT:  Jerome Kennedy. is a 47 y.o. male status post left trapeziectomy w/ suture suspensionplasty and volar plate advancement/repair of left thumb MCP joint.  Date of surgery: 08/07/22    PLAN:  We had a nice discussion with the patient in clinic today regarding their clinical course and findings. Sutures removed. Continue working with OT on ROM and splint wear. Continue lifting restrictions. RTC in 1 month.    INTERIM HISTORY:  Patient was seen in the ED on 08/17/22 complaining of left shoulder and forearm pain after mechanical fall. XR were obtained of shoulder and forearm and were negative for fracture or dislocation. Patient's pain was improved after being given tylenol and was discharged from the ED. Today he reports his pain is well controlled. He denies any infectious symptoms.    PHYSICAL EXAM:  General: Well developed, well nourished male in no apparent distress  Musculoskeletal:  Examination of the left upper extremity demonstrates an appropriately healing surgical incision.  There is no evidence of erythema, fluctuance, purulence, or drainage.  Significant edema present, but within expected range for postop.  Grossly neurovascularly intact in the median, radial, and ulnar nerve distribution.  Brisk capillary refill in all fingers.  Stiffness in all fingers but improved with passive ROM during exam. Unable to oppose thumb to the small finger. Improved position of thumb.      IMAGING:  NA

## 2022-09-02 ENCOUNTER — Ambulatory Visit: Admit: 2022-09-02 | Discharge: 2022-09-02 | Discharge status: Against Medical Advice | Payer: MEDICAID

## 2022-09-02 DIAGNOSIS — K5090 Crohn's disease, unspecified, without complications: Principal | ICD-10-CM

## 2022-09-02 LAB — CBC W/ AUTO DIFF
BASOPHILS ABSOLUTE COUNT: 0.1 10*9/L (ref 0.0–0.1)
BASOPHILS RELATIVE PERCENT: 1 %
EOSINOPHILS ABSOLUTE COUNT: 0.2 10*9/L (ref 0.0–0.5)
EOSINOPHILS RELATIVE PERCENT: 3.4 %
HEMATOCRIT: 35.3 % — ABNORMAL LOW (ref 39.0–48.0)
HEMOGLOBIN: 11.6 g/dL — ABNORMAL LOW (ref 12.9–16.5)
LYMPHOCYTES ABSOLUTE COUNT: 2.5 10*9/L (ref 1.1–3.6)
LYMPHOCYTES RELATIVE PERCENT: 40 %
MEAN CORPUSCULAR HEMOGLOBIN CONC: 32.9 g/dL (ref 32.0–36.0)
MEAN CORPUSCULAR HEMOGLOBIN: 32.2 pg (ref 25.9–32.4)
MEAN CORPUSCULAR VOLUME: 97.7 fL — ABNORMAL HIGH (ref 77.6–95.7)
MEAN PLATELET VOLUME: 8 fL (ref 6.8–10.7)
MONOCYTES ABSOLUTE COUNT: 0.5 10*9/L (ref 0.3–0.8)
MONOCYTES RELATIVE PERCENT: 8.3 %
NEUTROPHILS ABSOLUTE COUNT: 2.9 10*9/L (ref 1.8–7.8)
NEUTROPHILS RELATIVE PERCENT: 47.3 %
PLATELET COUNT: 262 10*9/L (ref 150–450)
RED BLOOD CELL COUNT: 3.61 10*12/L — ABNORMAL LOW (ref 4.26–5.60)
RED CELL DISTRIBUTION WIDTH: 17.8 % — ABNORMAL HIGH (ref 12.2–15.2)
WBC ADJUSTED: 6.2 10*9/L (ref 3.6–11.2)

## 2022-09-02 LAB — URINALYSIS WITH MICROSCOPY WITH CULTURE REFLEX
BACTERIA: NONE SEEN /HPF
BILIRUBIN UA: NEGATIVE
BLOOD UA: NEGATIVE
GLUCOSE UA: NEGATIVE
KETONES UA: NEGATIVE
LEUKOCYTE ESTERASE UA: NEGATIVE
NITRITE UA: NEGATIVE
PH UA: 6.5 (ref 5.0–9.0)
RBC UA: 1 /HPF (ref <=3)
SPECIFIC GRAVITY UA: 1.028 (ref 1.003–1.030)
SQUAMOUS EPITHELIAL: 1 /HPF (ref 0–5)
UROBILINOGEN UA: <2
WBC UA: 3 /HPF — ABNORMAL HIGH (ref <=2)

## 2022-09-02 LAB — COMPREHENSIVE METABOLIC PANEL
ALBUMIN: 2.3 g/dL — ABNORMAL LOW (ref 3.4–5.0)
ALKALINE PHOSPHATASE: 125 U/L — ABNORMAL HIGH (ref 46–116)
ALT (SGPT): 10 U/L (ref 10–49)
ANION GAP: 5 mmol/L (ref 5–14)
AST (SGOT): 17 U/L (ref <=34)
BILIRUBIN TOTAL: 0.2 mg/dL — ABNORMAL LOW (ref 0.3–1.2)
BLOOD UREA NITROGEN: 15 mg/dL (ref 9–23)
BUN / CREAT RATIO: 17
CALCIUM: 7.9 mg/dL — ABNORMAL LOW (ref 8.7–10.4)
CHLORIDE: 113 mmol/L — ABNORMAL HIGH (ref 98–107)
CO2: 23 mmol/L (ref 20.0–31.0)
CREATININE: 0.9 mg/dL
EGFR CKD-EPI (2021) MALE: >90 mL/min/{1.73_m2} (ref >=60)
GLUCOSE RANDOM: 82 mg/dL (ref 70–179)
POTASSIUM: 3.9 mmol/L (ref 3.4–4.8)
PROTEIN TOTAL: 5.5 g/dL — ABNORMAL LOW (ref 5.7–8.2)
SODIUM: 141 mmol/L (ref 135–145)

## 2022-09-02 LAB — SEDIMENTATION RATE: ERYTHROCYTE SEDIMENTATION RATE: 21 mm/h — ABNORMAL HIGH (ref 0–15)

## 2022-09-02 LAB — LIPASE: LIPASE: 47 U/L (ref 12–53)

## 2022-09-02 LAB — C-REACTIVE PROTEIN: C-REACTIVE PROTEIN: 4 mg/L (ref <=10.0)

## 2022-09-02 LAB — MAGNESIUM: MAGNESIUM: 1.6 mg/dL (ref 1.6–2.6)

## 2022-09-02 MED ADMIN — HYDROmorphone (PF) (DILAUDID) injection 1 mg: 1 mg | INTRAVENOUS | @ 21:00:00 | Stop: 2022-09-02

## 2022-09-02 MED ADMIN — metoclopramide (REGLAN) injection 10 mg: 10 mg | INTRAVENOUS | @ 21:00:00 | Stop: 2022-09-02

## 2022-09-02 MED ADMIN — sodium chloride 0.9% (NS) bolus 100 mL: 100 mL | INTRAVENOUS | @ 21:00:00 | Stop: 2022-09-02

## 2022-09-02 MED ADMIN — sodium chloride 0.9% (NS) bolus 1,000 mL: 1000 mL | INTRAVENOUS | @ 21:00:00 | Stop: 2022-09-02

## 2022-09-02 NOTE — Unmapped (Signed)
[facility]  Emergency Department Provider Note      ED Clinical Impression      Final diagnoses:   Crohn's disease without complication, unspecified gastrointestinal tract location (CMS-HCC) (Primary)       Impression, ED Course, Assessment and Plan     Impression, Differential Diagnosis and Plan of Care    Patient is a 47 y.o. male with a history of Crohn's disease, COPD, DVT, Bipolar disorder and small bowel obstruction who presents with gradual onset burning 7/10 bilateral lower quadrant abdominal pain for the past two days, associated with frequent, watery stool without evidence of blood: no bright red blood, no melena. Patient has a history of Crohn's disease currently treated with Humira with multiple visits to the emergency department for symptom control.     On examination, patient is overall chronically ill appearing, with stable vital signs, and in no acute distress. Multiple abdominal scars from previous surgeries, Multiple hernias through abdominal wall, but soft and reducible with minimal pressure. Abdomen is soft and nontender.    EKG shows normal sinus rhythm. CBC shows anemia to 11.6, but is without evidence of leukocytosis or thrombocytopenia. CMP is remarkable for low calcium, low albumin, low protein, and low bilirubin with no other electrolyte derangement, creatinine indicating good kidney function and liver enzymes remarkable for slightly elevated alkaline phosphatase. Magnesium is at the low end of normal at 1.6, Lipase is normal. Urinalysis shows no evidence of infection or other pathology. Sedimentation rate is very slightly elevated to 21, C reactive protein is normal at 4.    Patient given a liter of normal saline, a dose of 10mg  IV metoclopramide for nausea which controlled his nausea, and a dose of 1mg  IV dilaudid which controlled his pain adequately.    I would have proceeded with imaging to get a better understanding of this patient's presumed Crohn's flare, but he left against medical advice after we discussed the risks of leaving prior to a complete work up.        Outside Historian(s)  None.    External Records Reviewed  ED Visit 07/25/2022    Independent Interpretation of Studies  EKG: Normal Sinus Rhythm.    Discussion of Management with other Providers or Support Staff  I discussed the management of this patient no one.    Considerations Regarding Disposition/Escalation of Care and Critical Care  Diagnostic Tests Considered But Not Done: CT of abdomen and pelvis with contrast considered, but the patient left against medical advice.       History      Chief Complaint  Abdominal Pain      HPI   Jerome Kennedy. is a 47 y.o. male with gradual onset burning 7/10 bilateral lower quadrant abdominal pain for the past two days, associated with nausea and frequent, watery stool without evidence of blood: no bright red blood, no melena. Patient has a history of Crohn's disease currently treated with Humira with multiple visits to the emergency department for symptom control.    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 difficile colitis    Arthritis of carpometacarpal (CMC) joint of left thumb    Crohn disease (CMS-HCC)    Projectile vomiting with nausea    Periumbilical abdominal pain    Hypomagnesemia       Past Surgical History:   Procedure Laterality Date    CHOLECYSTECTOMY      COLON SURGERY      partial resection    HERNIA REPAIR      PR CLOSE ENTEROSTOMY,RESEC+COLOREC ANAS Midline 11/01/2019    Procedure: CLO ENTEROSTOMY; W/RESECT COLORECTAL ANASTOM;  Surgeon: Claretta Fraise, MD;  Location: MAIN OR [place];  Service: Gastrointestinal    PR COLONOSCOPY FLX DX W/COLLJ SPEC WHEN PFRMD Left 01/07/2013    Procedure: COLONOSCOPY, FLEXIBLE, PROXIMAL TO SPLENIC FLEXURE; DIAGNOSTIC, W/WO COLLECTION SPECIMEN BY BRUSH OR WASH;  Surgeon: Tish Men, MD;  Location: [facility];  Service: Gastroenterology    PR COLONOSCOPY FLX DX W/COLLJ SPEC WHEN PFRMD  03/09/2015    Procedure: COLONOSCOPY, FLEXIBLE, PROXIMAL TO SPLENIC FLEXURE; DIAGNOSTIC, W/WO COLLECTION SPECIMEN BY BRUSH OR WASH;  Surgeon: Cletis Athens, MD;  Location: [facility];  Service: Gastroenterology    PR COLONOSCOPY FLX DX W/COLLJ SPEC WHEN PFRMD N/A 10/28/2019    Procedure: COLONOSCOPY, FLEXIBLE, PROXIMAL TO SPLENIC FLEXURE; DIAGNOSTIC, W/WO COLLECTION SPECIMEN BY BRUSH OR WASH;  Surgeon: Beverly Milch, MD;  Location: [facility];  Service: Gastroenterology    PR EXPLORATORY OF ABDOMEN Midline 09/02/2019    Procedure: EXPLORATORY LAPAROTOMY, EXPLORATORY CELIOTOMY WITH OR WITHOUT BIOPSY(S);  Surgeon: Claretta Fraise, MD;  Location: MAIN OR [place];  Service: Gastrointestinal    PR EXPLORATORY OF ABDOMEN Midline 11/01/2019    Procedure: EXPLORATORY LAPAROTOMY, EXPLORATORY CELIOTOMY WITH OR WITHOUT BIOPSY(S);  Surgeon: Claretta Fraise, MD;  Location: MAIN OR [facility];  Service: Gastrointestinal    PR FIX FINGER,VOLAR PLATE,I-P JT Left 08/07/2022    Procedure: REPAIR AND RECONSTRUCTION, FINGER, VOLAR PLATE, INTERPHALANGEAL JOINT;  Surgeon: [name], MD;  Location: MAIN OR [facility];  Service: Orthopedics PR FREEING BOWEL ADHESION,ENTEROLYSIS N/A 11/01/2019    Procedure: Enterolysis (Separt Proc);  Surgeon: Claretta Fraise, MD;  Location: MAIN OR [facility];  Service: Gastrointestinal    PR ILEOSCOPY THRU STOMA,BIOPSY N/A 10/14/2019    Procedure: Karren Cobble; [place] Saa 1/MX;  Surgeon: Neysa Hotter, MD;  Location: [facility];  Service: Gastroenterology    PR PART REMOVAL COLON W COLOSTOMY Midline 09/02/2019    Procedure: COLECTOMY, PARTIAL; WITH SKIN LEVEL CECOSTOMY OR COLOSTOMY;  Surgeon: Claretta Fraise, MD;  Location: MAIN OR [facility];  Service: Gastrointestinal    PR REPAIR INCISIONAL HERNIA,REDUCIBLE Midline 09/02/2019    Procedure: REPAIR INIT INCISIONAL OR VENTRAL HERNIA; REDUCIBLE;  Surgeon: Kristopher Oppenheim, MD;  Location: MAIN OR [facility];  Service: Trauma    PR REPAIR INTERCARP/CARP-METACARP JT Left 08/07/2022    Procedure: Jorene Guest;  Surgeon: [name], MD;  Location: MAIN OR  [facility];  Service: Orthopedics    PR UPPER GI ENDOSCOPY,BIOPSY N/A 01/07/2013    Procedure: UGI ENDOSCOPY; WITH BIOPSY, SINGLE OR MULTIPLE;  Surgeon: Tish Men, MD;  Location: [facility];  Service: Gastroenterology    PR UPPER GI ENDOSCOPY,BIOPSY N/A 10/14/2019    Procedure: UGI ENDOSCOPY; WITH BIOPSY, SINGLE OR MULTIPLE;  Surgeon: Neysa Hotter, MD;  [facility]: [facility];  Service: Gastroenterology       No current facility-administered medications for this encounter.    Current Outpatient Medications:     ADALIMUMAB PEN CITRATE FREE 40 MG/0.4 ML, Inject the contents of 1 pen (40 mg total) under the skin every seven (7) days., Disp: 4 each, Rfl: 5    ascorbic acid, vitamin C, (VITAMIN C) 250 MG tablet, Take 1 tablet (250 mg total) by mouth every other day. Take with oral iron, Disp: 30 tablet, Rfl: 11    B complex vitamins (B COMPLEX-VITAMIN B12) tablet, Take 1 tablet by mouth daily., Disp: 30 tablet, Rfl: 11 budesonide-formoterol (SYMBICORT) 80-4.5 mcg/actuation inhaler, Inhale 2 puffs two (2) times a day. (Patient not taking: Reported on 08/07/2022), Disp: , Rfl:     calcium carbonate (TUMS) 200 mg calcium (500 mg) chewable tablet, Chew 2 tablets (400 mg of elem calcium total) two (2) times a day., Disp: 60 tablet, Rfl: 0    cholecalciferol, vitamin D3 25 mcg, 1,000 units,, 1,000 unit (25 mcg) tablet, Take 1 tablet (25 mcg total) by mouth daily., Disp: 30 tablet, Rfl: 0    empty container Misc, Use as directed to dispose of Humira, Disp: 1 each, Rfl: 3    ferrous sulfate 325 (65 FE) MG tablet, Take 1 tablet (325 mg total) by mouth every other day., Disp: , Rfl: 0    fluticasone propionate (FLONASE) 50 mcg/actuation nasal spray, 1 spray into each nostril every morning., Disp: , Rfl:     magnesium oxide (MAG-OX) 400 mg (241.3 mg elemental magnesium) tablet, Take 1 tablet (400 mg total) by mouth two (2) times a day., Disp: 60 tablet, Rfl: 0    nicotine (NICODERM CQ) 21 mg/24 hr patch, Place 1 patch on the skin daily., Disp: 28 patch, Rfl: 0    pantoprazole (PROTONIX) 40 MG tablet, Take 1 tablet (40 mg total) by mouth daily., Disp: , Rfl:     QUEtiapine (SEROQUEL) 100 MG tablet, Take 1 tablet (100 mg total) by mouth nightly., Disp: , Rfl:     Morphine and Toradol [ketorolac]    Family History   Problem Relation Age of Onset    Diabetes Maternal Aunt     Crohn's disease Neg Hx        Social History     Tobacco Use    Smoking status: Former     Current packs/day: 0.00     Types: Cigarettes     Quit date: 10/02/2001     Years since quitting: 20.9    Smokeless tobacco: Never    Tobacco comments:     Motivated to quit. Smokes 0.5ppd but relights each cig   Vaping Use    Vaping Use: Never used   Substance Use Topics    Alcohol use: No     Alcohol/week: 0.0 standard drinks of alcohol    Drug use: Yes     Types: Marijuana     Comment: Denies currently, once a month          Physical Exam     This provider entered the  patient's room wearing a surgical mask, eye protection and gloves.    ED Triage Vitals   Enc Vitals Group      BP 09/02/22 1322 117/68      Heart Rate 09/02/22 1315 68      Resp 09/02/22 1322 16      Temp 09/02/22 1322 37 ??C (98.6 ??F)      SpO2 09/02/22 1315 96 %      Weight 09/02/22 1322 61.2 kg (135 lb)     Constitutional: Alert and oriented. Well appearing and in no distress.  Eyes: Conjunctivae are normal.  ENT       Head: Normocephalic and atraumatic.       Nose: No congestion.       Mouth/Throat: Mucous membranes are moist.       Neck: No stridor.  Hematological/Lymphatic/Immunilogical: No cervical lymphadenopathy.  Cardiovascular: Normal rate, regular rhythm. Normal and symmetric distal pulses are present in all extremities.  Respiratory: Normal respiratory effort. Breath sounds are normal.  Gastrointestinal: Multiple abdominal scars from previous surgeries, Multiple hernias through abdominal wall, but soft and reducible with minimal pressure. Nontender. There is no CVA tenderness.  Musculoskeletal: Normal range of motion in all extremities.       Right lower leg: No tenderness or edema.       Left lower leg: No tenderness or edema.  Neurologic: Normal speech and language. No gross focal neurologic deficits are appreciated.  Skin: Skin is warm, dry and intact. No rash noted.  Psychiatric: Mood and affect are normal. Speech and behavior are normal.     EKG     Encounter Date: 09/02/22   ECG 12 Lead   Result Value    EKG Systolic BP     EKG Diastolic BP     EKG Ventricular Rate 94    EKG Atrial Rate 94    EKG P-R Interval 148    EKG QRS Duration 78    EKG Q-T Interval 334    EKG QTC Calculation 417    EKG Calculated P Axis 81    EKG Calculated R Axis 42    EKG Calculated T Axis 71    QTC Fredericia 387    Narrative    NORMAL SINUS RHYTHM  NORMAL ECG  WHEN COMPARED WITH ECG OF 25-Jul-2022 05:23,  QT HAS SHORTENED        Sharee Pimple, [place]  09/02/22 2051

## 2022-09-02 NOTE — Unmapped (Signed)
Patient endorsing Chrons flare. No blood. RLQ pain

## 2022-09-02 NOTE — Unmapped (Signed)
BIB [facility] c/o abdominal pain. Reports possible Chron's flare up.

## 2022-09-03 NOTE — Unmapped (Signed)
Outpatient Social Work  [facility]  Clinical Social Work Progress Note      CSW spoke with Pt via telephone 09/03/22 and completed ongoing contact re follow-up on in-clinic visit 08/20/22.     Referral Source & Reason for Contact:  Reason for Referral: Patient homeless  Referring Provider: Linwood Dibbles, OT / Barbaraann Cao, ATC  Date of Referral: 08/20/22    Biopsychosocial History:  Name Jerome Kennedy. is a 47 y.o. male who resides at the [facility] mens shelter in [place].   See Initial CSW Assessment note from 08/20/22 for complete assessment and details.     Social Work Notes:  Per chart review patient recently visited ED.    Patient stated he has a PCP appointment for his SSI benefits on 09/17/22 in [place] and he won't be able to make his Orthopedic appointment scheduled for the 14th since medicaid transportation will only take him to one visit in a day. Patient stated he is concerned about his hand as it is still swollen and his hand is black/peeling where the sutures are. CSW coordinated with patient's care team Re patient's concerns of his hand and being seen 09/05/22 and they will reach out Re scheduling another appointment.    Patient stated the resources CSW gave to him in-clinic have been misplaced and he requested the resources be provided to him again at his next appointment. CSW advised patient to ask for the resources in CSW note at his next visit if CSW is not available to meet with him directly.    CSW confirmed with patient he has a new PCP appointment with [facility] 09/19/22 and patient stated he was not aware of this. Patient requested a call a couple days prior to appointment for a reminder.     CSW provided an intervention for the [employer]  SDOH domain. The intervention was [facility] Management/Care Coordination.      Follow-Up Plan:  - CSW is available for ongoing psychosocial support as needed.       Raoul Pitch, MSW, LCSW-A  Clinical Social Worker   [facility]  Phone: [phone]    ---------------------------------------------------------------------  Patient Information:  Name: Jerome Kennedy, Jerome Kennedy    MRN: [id number]  DOB: 1976-03-17  Address: [address] [place] [address]  Jerome Kennedy  Primary phone: [phone]   Email: No e-mail address on record  PCP: Pcp, None Per Patient  ---------------------------------------------------------------------

## 2022-09-05 NOTE — Unmapped (Signed)
[facility] Refill Coordination Note    Specialty Medication(s) to be Shipped:   Inflammatory Disorders: Humira    Other medication(s) to be shipped: No additional medications requested for fill at this time     Jerome Kennedy., DOB: 04-22-1976  Phone: [phone] (home)       All above HIPAA information was verified with patient.     Was a Nurse, learning disability used for this call? No    Completed refill call assessment today to schedule patient's medication shipment from the [facility] [phone]).  All relevant notes have been reviewed.     Specialty medication(s) and dose(s) confirmed: Regimen is correct and unchanged.   Changes to medications: Jerome Kennedy reports no changes at this time.  Changes to insurance: No  New side effects reported not previously addressed with a pharmacist or physician: None reported  Questions for the pharmacist: No    Confirmed patient received a Conservation officer, historic buildings and a Surveyor, mining with first shipment. The patient will receive a drug information handout for each medication shipped and additional FDA Medication Guides as required.       DISEASE/MEDICATION-SPECIFIC INFORMATION        For patients on injectable medications: Patient currently has 1 doses left.  Next injection is scheduled for 2/4.    SPECIALTY MEDICATION ADHERENCE     Medication Adherence    Patient reported X missed doses in the last month: 0  Specialty Medication: HUMIRA(CF) PEN 40 mg/0.4 mL  Patient is on additional specialty medications: No                                Were doses missed due to medication being on hold? No    Humira 40/0.4 mg/ml: 2 days of medicine on hand        REFERRAL TO PHARMACIST     Referral to the pharmacist: Not needed      [facility]     Shipping address confirmed in Epic.     Delivery Scheduled: Yes, Expected medication delivery date: 09/10/22.     Medication will be delivered via Same Day Courier to the prescription address in Epic WAM.    [name] Libertas [facility] Specialty Technician

## 2022-09-05 NOTE — Unmapped (Signed)
OUTPATIENT OCCUPATIONAL THERAPY    UPPER EXTREMITY TREATMENT NOTE    Patient Name: Jerome Kennedy.  Date of Birth:10-13-1975  Date: 09/05/2022  Visit #: 2 total; 1/3 approved visits   Plan of Care Certification Dates: 09/05/2022 - 10/03/2022 3 units   Encounter Diagnoses   Name Primary?    Arthritis of carpometacarpal [place]) joint of left thumb Yes    Decreased range of motion of left thumb    Reason for referral: Please coordinate same day, after post-op appointment with Dr. Gus Puma on 08/20/22 for splint fabrication. Will not start motion until week 4 to protect volar plate repair.    Referring Provider: None Per Patient Pcp  Onset of Symptoms: sx 08/07/22  Per Referring Provider's note:   ASSESSMENT:  Jerome Kennedy. is a 47 y.o. male status post left trapeziectomy w/ suture suspensionplasty and volar plate advancement/repair of left thumb MCP joint.  Date of surgery: 08/07/22  PLAN:  We had a nice discussion with the patient in clinic today regarding their clinical course and findings. Sutures removed. Continue working with OT on ROM and splint wear. Continue lifting restrictions. RTC in 1 month.  Communication preference: verbal, written, visual  Prognosis: good due to motivation, health status     OT ASSESSMENT:   47 y.o. year old male with above diagnosis.  [name] demonstrates excellent fist formation and no pain, however is limited by significant edema and non-functional thumb motion, limiting his ability to manipulate coins. Patient requires skilled Occupational Therapy services for decreased range of motion, decreased strength, orthotic fit/management, impaired daily activities of living as appropriate.     Previous Level of Function: Pt was previously independent with all ADLs and IADLs.  Pt had trouble holding most objects due to thumb pain.     CURRENT LEVEL OF FUNCTION    Social and Occupational: pt is on disability and lives at a homeless shelter and is getting into section 8 housing eventually - they ask him to do chores like mop; has Regulatory affairs officer for transportation; has assistant for dressing, bathing - typically has his own meals prepped     Score:  Scaling is ranked from 0 indicating least disability to 100 indicating most disability.     Moderate complexity: This patient demonstrates 3-5 performance deficits relating to physical, cognitive and psychosocial skills that result in activity limitations and/or participation restrictions.  This patient may have comorbidities affecting occupational performance.  Please refer to Current level of function section for further details.     Short Term Goals:  1. In 1 session, patient will perform home exercise program with need for cuing to max IND with ADLs and IADLs. (met)  2. In 1 session, patient will demonstrate independent donning and doffing of orthotic to max joint integrity necessary for ADL completion. (met)  3. In 1 session, patient will verbalize proper care of orthotic and skin to max joint integrity necessary for ADL completion. (met)    Long Term Goals:   1. In 12 weeks, patient will perform upgraded home exercise program, to include progression to strengthening, independently  to max IND with ADLs and IADLs.  2. In 12 weeks, pt will demo thumb to LF opposition to max his/her IND with shoe tying.  3. In 12 weeks, pt will demo lateral pinch strength greater than or equal to 9 lbs to max the ability to open a door.   4. In 12 weeks, pt will identify 1-2 pieces of AE to max IND with  ADLs and IADLs.  5. In 12 weeks, pt will demo gross grip strength greater than or equal to 20 lbs to max the ability to open a door.     OT  PLAN OF CARE:  Pt will participate in:  Self Care/Hometraining  Orthotic Fit/Management   Therapeutic Exercise  Therapeutic Activity   Neuromuscular Re-education  Ultrasound  Hot/Cold Pack  Electrical Stimulation  Iontophoresis  Orthotic/Prosthetic Measure and Fit   Joint Mobilization  Physical Performance Measure   Manual Therapy    Planned frequency and duration of treatment: 1x / week/ 12 weeks. Plan will be adjusted as necessary.     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    Myalgia     SBO (small bowel obstruction) (CMS-HCC) 2011       Past Surgical History: Reviewed  Past Surgical History:   Procedure Laterality Date    CHOLECYSTECTOMY      COLON SURGERY      partial resection    HERNIA REPAIR      PR CLOSE ENTEROSTOMY,RESEC+COLOREC ANAS Midline 11/01/2019    Procedure: CLO ENTEROSTOMY; W/RESECT COLORECTAL ANASTOM;  Surgeon: Claretta Fraise, MD;  Location: MAIN OR [place];  Service: Gastrointestinal    PR COLONOSCOPY FLX DX W/COLLJ SPEC WHEN PFRMD Left 01/07/2013    Procedure: COLONOSCOPY, FLEXIBLE, PROXIMAL TO SPLENIC FLEXURE; DIAGNOSTIC, W/WO COLLECTION SPECIMEN BY BRUSH OR WASH;  Surgeon: Tish Men, MD;  [facility];  Service: Gastroenterology    PR COLONOSCOPY FLX DX W/COLLJ SPEC WHEN PFRMD  03/09/2015    Procedure: COLONOSCOPY, FLEXIBLE, PROXIMAL TO SPLENIC FLEXURE; DIAGNOSTIC, W/WO COLLECTION SPECIMEN BY BRUSH OR WASH;  Surgeon: Cletis Athens, MD;  Location: GI [facility]URES [facility];  Service: Gastroenterology    PR COLONOSCOPY FLX DX W/COLLJ SPEC WHEN PFRMD N/A 10/28/2019    Procedure: COLONOSCOPY, FLEXIBLE, PROXIMAL TO SPLENIC FLEXURE; DIAGNOSTIC, W/WO COLLECTION SPECIMEN BY BRUSH OR WASH;  Surgeon: Beverly Milch, MD;  Location: GI [facility]URES [facility]; Service: Gastroenterology    PR EXPLORATORY OF ABDOMEN Midline 09/02/2019    Procedure: EXPLORATORY LAPAROTOMY, EXPLORATORY CELIOTOMY WITH OR WITHOUT BIOPSY(S);  Surgeon: Claretta Fraise, MD;  Location: MAIN OR [place];  Service: Gastrointestinal    PR EXPLORATORY OF ABDOMEN Midline 11/01/2019    Procedure: EXPLORATORY LAPAROTOMY, EXPLORATORY CELIOTOMY WITH OR WITHOUT BIOPSY(S);  Surgeon: Claretta Fraise, MD;  Location: MAIN OR [facility];  Service: Gastrointestinal    PR FIX FINGER,VOLAR PLATE,I-P JT Left 08/07/2022    Procedure: REPAIR AND RECONSTRUCTION, FINGER, VOLAR PLATE, INTERPHALANGEAL JOINT;  Surgeon: [name], MD;  Location: MAIN OR [facility];  Service: Orthopedics    PR FREEING BOWEL ADHESION,ENTEROLYSIS N/A 11/01/2019    Procedure: Enterolysis ([place]art Proc);  Surgeon: Claretta Fraise, MD;  Location: MAIN OR [facility];  Service: Gastrointestinal  PR ILEOSCOPY THRU STOMA,BIOPSY N/A 10/14/2019    Procedure: Karren Cobble; [place] Saa 1/MX;  Surgeon: Neysa Hotter, MD;  Location: GI [facility];  Service: Gastroenterology    PR PART REMOVAL COLON W COLOSTOMY Midline 09/02/2019    Procedure: COLECTOMY, PARTIAL; WITH SKIN LEVEL CECOSTOMY OR COLOSTOMY;  Surgeon: Claretta Fraise, MD;  Location: MAIN OR [facility];  Service: Gastrointestinal    PR REPAIR INCISIONAL HERNIA,REDUCIBLE Midline 09/02/2019    Procedure: REPAIR INIT INCISIONAL OR VENTRAL HERNIA; REDUCIBLE;  Surgeon: Kristopher Oppenheim, MD;  Location: MAIN OR [place];  Service: Trauma    PR REPAIR INTERCARP/CARP-METACARP JT Left 08/07/2022    Procedure: Jorene Guest;  Surgeon: [name], MD;  Location: MAIN OR [facility];  Service: Orthopedics    PR UPPER GI ENDOSCOPY,BIOPSY N/A 01/07/2013    Procedure: UGI ENDOSCOPY; WITH BIOPSY, SINGLE OR MULTIPLE;  Surgeon: Tish Men, MD;  Location: GI [facility];  Service: Gastroenterology    PR UPPER GI ENDOSCOPY,BIOPSY N/A 10/14/2019    Procedure: UGI ENDOSCOPY; WITH BIOPSY, SINGLE OR MULTIPLE;  Surgeon: Neysa Hotter, MD;  Location: GI PROCEDURES [facility];  Service: Gastroenterology       Allergies: Reviewed  Morphine and Toradol [ketorolac]    Medications: Reviewed    Current Outpatient Medications:     ADALIMUMAB PEN CITRATE FREE 40 MG/0.4 ML, Inject the contents of 1 pen (40 mg total) under the skin every seven (7) days., Disp: 4 each, Rfl: 5    ascorbic acid, vitamin C, (VITAMIN C) 250 MG tablet, Take 1 tablet (250 mg total) by mouth every other day. Take with oral iron, Disp: 30 tablet, Rfl: 11    B complex vitamins (B COMPLEX-VITAMIN B12) tablet, Take 1 tablet by mouth daily., Disp: 30 tablet, Rfl: 11    budesonide-formoterol (SYMBICORT) 80-4.5 mcg/actuation inhaler, Inhale 2 puffs two (2) times a day. (Patient not taking: Reported on 08/07/2022), Disp: , Rfl:     calcium carbonate (TUMS) 200 mg calcium (500 mg) chewable tablet, Chew 2 tablets (400 mg of elem calcium total) two (2) times a day., Disp: 60 tablet, Rfl: 0    cholecalciferol, vitamin D3 25 mcg, 1,000 units,, 1,000 unit (25 mcg) tablet, Take 1 tablet (25 mcg total) by mouth daily., Disp: 30 tablet, Rfl: 0    empty container Misc, Use as directed to dispose of Humira, Disp: 1 each, Rfl: 3    ferrous sulfate 325 (65 FE) MG tablet, Take 1 tablet (325 mg total) by mouth every other day., Disp: , Rfl: 0    fluticasone propionate (FLONASE) 50 mcg/actuation nasal spray, 1 spray into each nostril every morning., Disp: , Rfl:     magnesium oxide (MAG-OX) 400 mg (241.3 mg elemental magnesium) tablet, Take 1 tablet (400 mg total) by mouth two (2) times a day., Disp: 60 tablet, Rfl: 0    nicotine (NICODERM CQ) 21 mg/24 hr patch, Place 1 patch on the skin daily., Disp: 28 patch, Rfl: 0    pantoprazole (PROTONIX) 40 MG tablet, Take 1 tablet (40 mg total) by mouth daily., Disp: , Rfl:     QUEtiapine (SEROQUEL) 100 MG tablet, Take 1 tablet (100 mg total) by mouth nightly., Disp: , Rfl: Precautions: No strengthening or weightbearing, orthosis on at all times except to perform change dressings,check skin, exercise    POST-OP HEALING TIMELINE FOR trapeziectomy w/ suture suspensionplasty and volar plate advancement/repair of left thumb MCP joint.     Date of Surgery: 08/07/22  week 2:  1/18: self passive ROM exercises are Initiated to the thumb and wrist 6 - 8 times a day for 10 minute sessions; ? The [facility] joint should be supported during self passive exercises.  week 3: 1/25  week 4: 2/1: Active and self passive ROM exercises are Initiated to the thumb and wrist 6 - 8 times a day for 10 minute sessions. Exercises should emphasize:   o palmar & radial abduction   o thumb circumduction   o flexion/ extension   o wrist flexion/extension   o wrist radial/ulnar deviation  ? The wrist and thumb static splint is worn between exercise sessions and at night for protection of the surgery and for comfort.   ? Scar management is initiated at 3 weeks. It is critical to emphasize scar mobilization as dense adhesions are common. Scar massage with lotion, scar retraction using a piece of dycem, and use of a scar remodeling product such as Rolyan 50/501M, OtoformK???, or Elastomerm are recommended.   ? Initiate manual desensitization techniques as the area is often hypersensitive along the susurgical site, as well as due to superficial sensory branch of radial nerve neuritis.  week 5: 2/8  week 6: 2/15: Unrestricted PROM exercises may be Initiated. Continue to support the [facility] joint ? On rare occasion, it becomes necessary to add dynamic flexion splinting for the MP and IP Joint of the thumb. Any dynamic splint must be form fitting and provide maximal support of the [facility] joint. 2 ? Continue with the wrist and thumb static splint between exercise sessions and at night ? Persistent and dense scars may benefit from ultrasound. The ultrasound can enhance the vasoelasticity of the soft tissues, thus increasing mobility. week 7: 2/22  week 8: 2/29    Prior OT Service: no    Pain: 6/10 L thumb at rest     OBJECTIVE    Sensation: intact per pt report    Upper Extremity Function:    Shoulder:  WFL     Elbow:  WFL    Hand:   Pt is right hand dominant               AROM (degrees) Date: 09/05/22  Right Date: 09/05/22  Left   Wrist   extension/flexion  60/40   Supination/pronation     rad/uln deviation     Composite flex to [facility]   (cm lack)     Index  full   Middle  full   Ring  full   Small  full   Digit Extension     Thumb  Opposition  To SF MP volar crease  To LF PIP volar crease      Gross grip strength (pounds)  Position 2 Male - age 65-49 R: 65-155 L: 58-160    Pinch strength (pounds)  Lateral  palmar       Wound/Incision(s) or Scar: incision covered with steri strips; no drainage    Edema: moderate in the thumb and digits. Educated pt on the need to perform active range of motion to allow adequate venous return and prevent stiffness.     VISIT SUMMARY    Daily Report: Darik reports that his hand has been so swollen that he can't put his splint on.   Daily Pain:  0/10 at rest; 0/10 at worst in the past week    Pt presented to the clinic with significant edema in the hand and thenar eminence.  Ice was place on the hand with a layer between  the skin and the edema reduced.  I asked Jeret to try to place ice on his hand to address the edema.     Orthotic Fit/Management (25 min):  Fabricated hand based thumb spica orthosis, with goal of protecting the healing repair.  Pt educated on splint wearing protocol, including at all times except to check skin and exercises.  Pt also educated on the need to perform frequent skin checks to ensure that there is no skin break down.  Pt educated on proper care of orthosis, including cleaning procedures and advising to stay away from heat sources.  Pt advised that if there are any remaining questions or problems regarding  the orthosis, they are encouraged to contact us.  Educated pt on and demonstrated proper donning and doffing of the orthosis.    Moist heat applied to wrist/hand x 5 min (during orthotic fabrication) to prep tissue for increased extensibility in preparation for exercise.     Self Care/home training (20 min):  Issued HEP with patient demo (see below) with handouts provided to the patient.   Educated pt on weightbearing precautions and healing time-line.   Kinesiotape applied to the dorsal thumb and IF, dorsal hand a forearm with a 5-10% stretch in a distal to proximal direction to facilitate adequate venous fluid return.  Patient educated to check his/her skin for any adverse reactions and for appropriate circulation, as well as how to safely remove the tape.  Pt verbalized understanding of the aforementioned.     Home Program:   Apply low to moderate heat 10 min prior to exercises for improved tissue extensibility  Tendon gliding  Thumb opposition to all digits     Therapist wore a mask for the entirety of the session.     I reviewed the no-show/attendance policy with the patient and caregiver(s). The family is aware that they must call to cancel appointments more than 24 hours in advance. They are also aware that if they late cancel or no-show three times, we reserve the right to cancel their remaining appointments. This policy is in place to allow [place] to best serve the needs of our caseload.    Treatment Rendered:   Orthotic Management/Training: 25 min  Self Care/Home Training: 20 min    Total Treatment Time: 45 min    Patient Education:  Topics: home program, disease process  Education Provided to: patient  Education Type: education, demonstration, literature  Response to education/teachback: verbal understanding received, return demonstration    I attest that I have reviewed the above information.  SignedLinwood Dibbles, OT  09/05/2022 9:28 AM

## 2022-09-08 ENCOUNTER — Ambulatory Visit: Admit: 2022-09-08 | Payer: MEDICAID

## 2022-09-10 MED FILL — HUMIRA PEN CITRATE FREE 40 MG/0.4 ML: SUBCUTANEOUS | 28 days supply | Qty: 4 | Fill #2

## 2022-09-15 DIAGNOSIS — K50018 Crohn's disease of small intestine with other complication: Principal | ICD-10-CM

## 2022-09-17 ENCOUNTER — Ambulatory Visit: Admit: 2022-09-17 | Discharge: 2022-09-17 | Discharge status: Against Medical Advice | Payer: MEDICAID

## 2022-09-17 NOTE — Unmapped (Signed)
Lower back pain, mechanical fall yesterday, no LOC, no thinners.

## 2022-09-18 NOTE — Unmapped (Signed)
Bed: HALL-01A  Expected date:   Expected time:   Means of arrival:   Comments:

## 2022-09-18 NOTE — Unmapped (Signed)
Pt left without being seen     Townsend Roger, MD  Resident  09/18/22 [phone]

## 2022-09-18 NOTE — Unmapped (Signed)
Patient B[facility] - [facility] after fall yesterday. Endorsing lower back pain

## 2022-09-19 ENCOUNTER — Ambulatory Visit: Admit: 2022-09-19 | Payer: MEDICAID

## 2022-09-19 NOTE — Unmapped (Unsigned)
[facility]  New Patient Clinic Note    Assessment/Plan:   Jerome Kennedy is a 47 y.o.male here for:    Problem List Items Addressed This Visit    None      Attending: Dr. Ralene Bathe, Felipa Evener*      Subjective   Jerome Kennedy is a 47 y.o. male  coming to clinic today for the following issues:    No chief complaint on file.    HPI:    ***    - 47 y.o. male with a history of Crohn's disease, COPD, DVT, Bipolar disorder and small bowel obstruction.  - Patient has a history of Crohn's disease currently treated with Humira with multiple visits to the emergency department for symptom control.    -Primary IBD provider, Dr [name].  - Hospitalized for Crohn's flare in December 2023:  PCP for Vit D level (pending)  PCP for mag/calcium checks  PCP/GI for possible B12 injections?  PCP/GI for B12 check    ROS: The balance of the remaining 10 systems reviewed are negative.    I have reviewed the problem list, medications, and allergies and have updated/reconciled them if needed.    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    Myalgia     SBO (small bowel obstruction) (CMS-HCC) 2011     Past Surgical History:   Procedure Laterality Date    CHOLECYSTECTOMY      COLON SURGERY      partial resection    HERNIA REPAIR      PR CLOSE ENTEROSTOMY,RESEC+COLOREC ANAS Midline 11/01/2019    Procedure: CLO ENTEROSTOMY; W/RESECT COLORECTAL ANASTOM;  Surgeon: Claretta Fraise, MD;  Location: MAIN OR [place];  Service: Gastrointestinal    PR COLONOSCOPY FLX DX W/COLLJ SPEC WHEN PFRMD Left 01/07/2013 Procedure: COLONOSCOPY, FLEXIBLE, PROXIMAL TO SPLENIC FLEXURE; DIAGNOSTIC, W/WO COLLECTION SPECIMEN BY BRUSH OR WASH;  Surgeon: Tish Men, MD;  [facility];  Service: Gastroenterology    PR COLONOSCOPY FLX DX W/COLLJ SPEC WHEN PFRMD  03/09/2015    Procedure: COLONOSCOPY, FLEXIBLE, PROXIMAL TO SPLENIC FLEXURE; DIAGNOSTIC, W/WO COLLECTION SPECIMEN BY BRUSH OR WASH;  Surgeon: Cletis Athens, MD;  Location: [facility];  Service: Gastroenterology    PR COLONOSCOPY FLX DX W/COLLJ SPEC WHEN PFRMD N/A 10/28/2019    Procedure: COLONOSCOPY, FLEXIBLE, PROXIMAL TO SPLENIC FLEXURE; DIAGNOSTIC, W/WO COLLECTION SPECIMEN BY BRUSH OR WASH;  Surgeon: Beverly Milch, MD;  Location: GI [facility]URES [facility];  Service: Gastroenterology    PR EXPLORATORY OF ABDOMEN Midline 09/02/2019    Procedure: EXPLORATORY LAPAROTOMY, EXPLORATORY CELIOTOMY WITH OR WITHOUT BIOPSY(S);  Surgeon: Claretta Fraise, MD;  Location: MAIN OR [place];  Service: Gastrointestinal    PR EXPLORATORY OF ABDOMEN Midline 11/01/2019    Procedure: EXPLORATORY LAPAROTOMY, EXPLORATORY CELIOTOMY WITH OR WITHOUT BIOPSY(S);  Surgeon: Claretta Fraise, MD;  Location: MAIN OR [place];  Service: Gastrointestinal    PR FIX [place] Peers PLATE,I-P JT  Left 08/07/2022    Procedure: REPAIR AND RECONSTRUCTION, FINGER, VOLAR PLATE, INTERPHALANGEAL JOINT;  Surgeon: [name], MD;  Location: MAIN OR [facility];  Service: Orthopedics    PR FREEING BOWEL ADHESION,ENTEROLYSIS N/A 11/01/2019    Procedure: Enterolysis (Separt Proc);  Surgeon: Claretta Fraise, MD;  Location: MAIN OR [facility];  Service: Gastrointestinal    PR ILEOSCOPY THRU STOMA,BIOPSY N/A 10/14/2019    Procedure: Karren Cobble; [place] Saa 1/MX;  Surgeon: Neysa Hotter, MD;  Location: [facility];  Service: Gastroenterology    PR PART REMOVAL COLON W COLOSTOMY Midline 09/02/2019    Procedure: COLECTOMY, PARTIAL; WITH SKIN LEVEL CECOSTOMY OR COLOSTOMY;  Surgeon: Claretta Fraise, MD;  Location: MAIN OR [facility];  Service: Gastrointestinal    PR REPAIR INCISIONAL HERNIA,REDUCIBLE Midline 09/02/2019    Procedure: REPAIR INIT INCISIONAL OR VENTRAL HERNIA; REDUCIBLE;  Surgeon: Kristopher Oppenheim, MD;  Location: MAIN OR [place];  Service: Trauma    PR REPAIR INTERCARP/CARP-METACARP JT Left 08/07/2022    Procedure: Jorene Guest;  Surgeon: [name], MD;  Location: MAIN OR [facility];  Service: Orthopedics    PR UPPER GI ENDOSCOPY,BIOPSY N/A 01/07/2013    Procedure: UGI ENDOSCOPY; WITH BIOPSY, SINGLE OR MULTIPLE;  Surgeon: Tish Men, MD;  Location: [facility];  Service: Gastroenterology    PR UPPER GI ENDOSCOPY,BIOPSY N/A 10/14/2019    Procedure: UGI ENDOSCOPY; WITH BIOPSY, SINGLE OR MULTIPLE;  Surgeon: Neysa Hotter, MD;  Location: [facility]URES [facility];  Service: Gastroenterology      Allergies   Allergen Reactions    Morphine Itching and Rash    Toradol [Ketorolac] Itching     Social History     Tobacco Use    Smoking status: Former     Current packs/day: 0.00     Types: Cigarettes     Quit date: 10/02/2001     Years since quitting: 20.9    Smokeless tobacco: Never    Tobacco comments:     Motivated to quit. Smokes 0.5ppd but relights each cig   Vaping Use    Vaping status: Never Used   Substance Use Topics    Alcohol use: No     Alcohol/week: 0.0 standard drinks of alcohol    Drug use: Yes     Types: Marijuana     Comment: Denies currently, once a month     Family History   Problem Relation Age of Onset    Diabetes Maternal Aunt     Crohn's disease Neg Hx        Health Maintenance   Topic Date Due    COVID-19 Vaccine (4 - 2023-24 season) 04/04/2022    Lipid Screening  05/21/2027    Colon Cancer Screening  10/27/2029    DTaP/Tdap/Td Vaccines (2 - Td or Tdap) 11/24/2030    Pneumococcal Vaccine 0-64  Completed    Hepatitis C Screen  Completed    Influenza Vaccine  Completed       Objective     Vitals: There were no vitals taken for this visit.    Physical Exam  Constitutional:       Appearance: Normal appearance.   HENT:      Head: Normocephalic and atraumatic.   Eyes:      Extraocular Movements: Extraocular movements intact.      Pupils: Pupils are equal, round, and reactive to light.   Pulmonary:      Effort: Pulmonary effort is normal.   Skin:  General: Skin is warm.   Neurological:      General: No focal deficit present.      Mental Status: He is alert.         Labs/Imaging:  No visits with results within 3 Day(s) from this visit.   Latest known visit with results is:   Admission on 09/02/2022, Discharged on 09/02/2022   Component Date Value    Sodium 09/02/2022 141     Potassium 09/02/2022 3.9     Chloride 09/02/2022 113 (H)     CO2 09/02/2022 23.0     Anion Gap 09/02/2022 5     BUN 09/02/2022 15     Creatinine 09/02/2022 0.90     BUN/Creatinine Ratio 09/02/2022 17     eGFR CKD-EPI (2021) Male 09/02/2022 >90     Glucose 09/02/2022 82     Calcium 09/02/2022 7.9 (L)     Albumin 09/02/2022 2.3 (L)     Total Protein 09/02/2022 5.5 (L)     Total Bilirubin 09/02/2022 0.2 (L)     AST 09/02/2022 17     ALT 09/02/2022 10     Alkaline Phosphatase 09/02/2022 125 (H)     Lipase 09/02/2022 47     Color, UA 09/02/2022 Light Yellow     Clarity, UA 09/02/2022 Clear     Specific Gravity, UA 09/02/2022 1.028     pH, UA 09/02/2022 6.5     Leukocyte Esterase, UA 09/02/2022 Negative     Nitrite, UA 09/02/2022 Negative     Protein, UA 09/02/2022 Trace (A)     Glucose, UA 09/02/2022 Negative     Ketones, UA 09/02/2022 Negative     Urobilinogen, UA 09/02/2022 <2.0 mg/dL     Bilirubin, UA 04/54/0981 Negative     Blood, UA 09/02/2022 Negative     RBC, UA 09/02/2022 1     WBC, UA 09/02/2022 3 (H)     Squam Epithel, UA 09/02/2022 1     Bacteria, UA 09/02/2022 None Seen     Mucus, UA 09/02/2022 Rare (A)     EKG Ventricular Rate 09/02/2022 94     EKG Atrial Rate 09/02/2022 94     EKG P-R Interval 09/02/2022 148     EKG QRS Duration 09/02/2022 78     EKG Q-T Interval 09/02/2022 334     EKG QTC Calculation 09/02/2022 417     EKG Calculated P Axis 09/02/2022 81     EKG Calculated R Axis 09/02/2022 42     EKG Calculated T Axis 09/02/2022 71     QTC Fredericia 09/02/2022 387     WBC 09/02/2022 6.2     RBC 09/02/2022 3.61 (L)     HGB 09/02/2022 11.6 (L)     HCT 09/02/2022 35.3 (L)     MCV 09/02/2022 97.7 (H)     MCH 09/02/2022 32.2     MCHC 09/02/2022 32.9     RDW 09/02/2022 17.8 (H)     MPV 09/02/2022 8.0     Platelet 09/02/2022 262     Neutrophils % 09/02/2022 47.3     Lymphocytes % 09/02/2022 40.0     Monocytes % 09/02/2022 8.3     Eosinophils % 09/02/2022 3.4     Basophils % 09/02/2022 1.0     Absolute Neutrophils 09/02/2022 2.9     Absolute Lymphocytes 09/02/2022 2.5     Absolute Monocytes 09/02/2022 0.5     Absolute Eosinophils 09/02/2022 0.2     Absolute Basophils 09/02/2022 0.1  Anisocytosis 09/02/2022 Slight (A)     CRP 09/02/2022 4.0     Sed Rate 09/02/2022 21 (H)     Magnesium 09/02/2022 1.6        [employer] [facility] [place] [facility]  [address] [place], [place] [address]  Telephone [phone]  Fax [phone]  [email or web address]

## 2022-09-22 ENCOUNTER — Emergency Department
Admit: 2022-09-22 | Discharge: 2022-09-22 | Disposition: A | Discharge status: Home / Self Care | Payer: MEDICAID | Attending: Emergency Medicine

## 2022-09-22 ENCOUNTER — Ambulatory Visit
Admit: 2022-09-22 | Discharge: 2022-09-22 | Disposition: A | Discharge status: Home / Self Care | Payer: MEDICAID | Attending: Emergency Medicine

## 2022-09-22 DIAGNOSIS — R1084 Generalized abdominal pain: Principal | ICD-10-CM

## 2022-09-22 DIAGNOSIS — R112 Nausea with vomiting, unspecified: Principal | ICD-10-CM

## 2022-09-22 LAB — CBC W/ AUTO DIFF
BASOPHILS ABSOLUTE COUNT: 0 10*9/L (ref 0.0–0.1)
BASOPHILS RELATIVE PERCENT: 1.3 %
EOSINOPHILS ABSOLUTE COUNT: 0.1 10*9/L (ref 0.0–0.5)
EOSINOPHILS RELATIVE PERCENT: 3.8 %
HEMATOCRIT: 37.9 % — ABNORMAL LOW (ref 39.0–48.0)
HEMOGLOBIN: 12.1 g/dL — ABNORMAL LOW (ref 12.9–16.5)
LYMPHOCYTES ABSOLUTE COUNT: 1.6 10*9/L (ref 1.1–3.6)
LYMPHOCYTES RELATIVE PERCENT: 42.5 %
MEAN CORPUSCULAR HEMOGLOBIN CONC: 32 g/dL (ref 32.0–36.0)
MEAN CORPUSCULAR HEMOGLOBIN: 30 pg (ref 25.9–32.4)
MEAN CORPUSCULAR VOLUME: 94 fL (ref 77.6–95.7)
MEAN PLATELET VOLUME: 7.9 fL (ref 6.8–10.7)
MONOCYTES ABSOLUTE COUNT: 0.3 10*9/L (ref 0.3–0.8)
MONOCYTES RELATIVE PERCENT: 7.8 %
NEUTROPHILS ABSOLUTE COUNT: 1.7 10*9/L — ABNORMAL LOW (ref 1.8–7.8)
NEUTROPHILS RELATIVE PERCENT: 44.6 %
PLATELET COUNT: 275 10*9/L (ref 150–450)
RED BLOOD CELL COUNT: 4.03 10*12/L — ABNORMAL LOW (ref 4.26–5.60)
RED CELL DISTRIBUTION WIDTH: 17.2 % — ABNORMAL HIGH (ref 12.2–15.2)
WBC ADJUSTED: 3.8 10*9/L (ref 3.6–11.2)

## 2022-09-22 LAB — LIPASE: LIPASE: 36 U/L (ref 12–53)

## 2022-09-22 LAB — URINALYSIS WITH MICROSCOPY WITH CULTURE REFLEX
BACTERIA: NONE SEEN /HPF
BILIRUBIN UA: NEGATIVE
BLOOD UA: NEGATIVE
GLUCOSE UA: NEGATIVE
KETONES UA: NEGATIVE
NITRITE UA: NEGATIVE
PH UA: 6 (ref 5.0–9.0)
PROTEIN UA: 30 — AB
RBC UA: 3 /HPF (ref <=3)
SPECIFIC GRAVITY UA: 1.036 — ABNORMAL HIGH (ref 1.003–1.030)
SQUAMOUS EPITHELIAL: 5 /HPF (ref 0–5)
UROBILINOGEN UA: 3 — AB
WBC UA: 14 /HPF — ABNORMAL HIGH (ref <=2)

## 2022-09-22 LAB — COMPREHENSIVE METABOLIC PANEL
ALBUMIN: 2.3 g/dL — ABNORMAL LOW (ref 3.4–5.0)
ALKALINE PHOSPHATASE: 120 U/L — ABNORMAL HIGH (ref 46–116)
ALT (SGPT): 14 U/L (ref 10–49)
ANION GAP: 4 mmol/L — ABNORMAL LOW (ref 5–14)
AST (SGOT): 17 U/L (ref <=34)
BILIRUBIN TOTAL: 0.2 mg/dL — ABNORMAL LOW (ref 0.3–1.2)
BLOOD UREA NITROGEN: 10 mg/dL (ref 9–23)
BUN / CREAT RATIO: 11
CALCIUM: 8.3 mg/dL — ABNORMAL LOW (ref 8.7–10.4)
CHLORIDE: 116 mmol/L — ABNORMAL HIGH (ref 98–107)
CO2: 23 mmol/L (ref 20.0–31.0)
CREATININE: 0.92 mg/dL
EGFR CKD-EPI (2021) MALE: >90 mL/min/{1.73_m2} (ref >=60)
GLUCOSE RANDOM: 77 mg/dL (ref 70–179)
POTASSIUM: 3.7 mmol/L (ref 3.4–4.8)
PROTEIN TOTAL: 5.5 g/dL — ABNORMAL LOW (ref 5.7–8.2)
SODIUM: 143 mmol/L (ref 135–145)

## 2022-09-22 LAB — SEDIMENTATION RATE: ERYTHROCYTE SEDIMENTATION RATE: 25 mm/h — ABNORMAL HIGH (ref 0–15)

## 2022-09-22 LAB — C-REACTIVE PROTEIN: C-REACTIVE PROTEIN: 13 mg/L — ABNORMAL HIGH (ref <=10.0)

## 2022-09-22 MED ADMIN — iohexol (OMNIPAQUE) 350 mg iodine/mL solution 100 mL: 100 mL | INTRAVENOUS | @ 11:00:00 | Stop: 2022-09-22

## 2022-09-22 MED ADMIN — promethazine (PHENERGAN) 12.5 mg in sodium chloride (NS) 0.9 % 25 mL infusion: 12.5 mg | INTRAVENOUS | @ 10:00:00 | Stop: 2022-09-22

## 2022-09-22 MED ADMIN — HYDROmorphone (PF) (DILAUDID) injection 1 mg: 1 mg | INTRAVENOUS | @ 12:00:00 | Stop: 2022-09-22

## 2022-09-22 MED ADMIN — HYDROmorphone (PF) (DILAUDID) injection 1 mg: 1 mg | INTRAVENOUS | @ 10:00:00 | Stop: 2022-09-22

## 2022-09-22 MED ADMIN — sodium chloride 0.9% (NS) bolus 1,000 mL: 1000 mL | INTRAVENOUS | @ 10:00:00 | Stop: 2022-09-22

## 2022-09-22 NOTE — Unmapped (Signed)
Pt BIB [facility], coming from [facility] shelter. Abd pain N/V for the past hour. Reports hx of crohns.

## 2022-09-22 NOTE — Unmapped (Signed)
[facility]  Emergency Department Provider Note       ED Clinical Impression     Final diagnoses:   Generalized abdominal pain (Primary)   Nausea and vomiting, unspecified vomiting type        History     Chief Complaint  Chief Complaint   Patient presents with    Abdominal Pain       HPI   Jerome Kennedy. is a 47 y.o. male with a history as below who is presenting with abdominal and.  Patient presenting with 1 day history of abdominal pain associated with nausea vomiting and nonbloody diarrhea.  No fever, cough, congestion, headache, chest pain, or difficulty breathing.  He took ibuprofen today without relief.  He is on Humira with his last injection being the week prior.     Impression, Medical Decision Making, ED Course     Impression: This is a 47 y.o. male with a history of Crohn's disease, COPD, colon surgery, cholecystectomy who presents with abdominal pain. Upon initial evaluation in the emergency department, the patient was non-toxic appearing with reassuring vitals as below.    BP 102/67  - Pulse 85  - Temp 36.9 ??C (98.4 ??F) (Oral)  - SpO2 94%     Diagnostic workup as below.  Patient with history of Crohn's disease presenting with abdominal pain associated with nausea vomiting diarrhea.  My differential includes Crohn's flare, stricture, gastroenteritis, among other things.  Patient with ventral hernia which is easily reducible without tenderness so low suspicion for strangulated/incarcerated hernia.  Right-sided abdominal tenderness but no rebound or guarding.  Electrolytes at baseline.  No leukocytosis.  Inflammatory markers at baseline.  Urinalysis with signs of dehydration but no infection.  Personally reviewed CT scan which shows signs of bowel inflammation but no obstruction.  Radiologist reporting changes consistent with Crohn's disease but no fistula, obstruction, or significant stricturing.  On reevaluation after Dilaudid, Phenergan, and IV fluids, patient reports improvement in symptoms. Requesting sandwich and tolerating p.o.  We recommended discharge with outpatient symptomatic management with oral rehydration.  Patient will follow-up with his GI doctor in the next 1 to 2 weeks for reevaluation as needed.  He verbalized understanding of strict return precautions.    Orders Placed This Encounter   Procedures    Urine Culture    CT Abdomen Pelvis W IV Contrast    Comprehensive Metabolic Panel    CBC w/ Differential    Lipase    Urinalysis with Microscopy with Culture Reflex    Sedimentation rate, manual    C-reactive protein            ____________________________________________    The case was discussed with Dr. Jeral Pinch, MD, who is in agreement with the above assessment and plan.    Dictation software was used while making this note. Please excuse any errors made with dictation software.     Additional Medical Decision Making     I have reviewed the vital signs and the nursing notes. Labs and radiology results that were available during my care of the patient were independently reviewed by me and considered in my medical decision making.     I independently visualized the EKG tracing if performed.  I independently visualized the radiology images if performed.  I reviewed the patient's prior medical records if available.  Additional history obtained from family if available.  I discussed the case with the admitting provider and the consulting services if the patient was admitted and/or  consulting services were utilized.     Physical Exam     VITAL SIGNS:      Vitals:    09/22/22 0315 09/22/22 0327   BP:  102/67   Pulse: 98 85   Temp:  36.9 ??C (98.4 ??F)   TempSrc:  Oral   SpO2: 99% 94%       Physical Exam   Constitutional: He appears healthy. No distress.   HENT:   Nose: Nose normal.   Mouth/Throat: Oropharynx is clear.   Eyes: Pupils are equal, round, and reactive to light. Conjunctivae are normal.   Cardiovascular: Normal rate, regular rhythm, normal heart sounds, intact distal pulses and normal pulses.   Pulmonary/Chest: Effort normal and breath sounds normal. He has no wheezes. He has no rales. He exhibits no tenderness.   Abdominal: Soft. He exhibits no distension. There is abdominal tenderness. A hernia is present.   No rebound or guarding.  Hernia is easily reducible   Musculoskeletal:         General: No deformity or edema.   Neurological: He is alert and oriented to person, place, and time.   Skin: Skin is warm and dry.        History     Chief Complaint  Chief Complaint   Patient presents with    Abdominal Pain       HPI   See above     All other systems have been reviewed and are negative except as otherwise documented.    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    Myalgia     SBO (small bowel obstruction) (CMS-HCC) 2011       Past Surgical History:   Procedure Laterality Date    CHOLECYSTECTOMY      COLON SURGERY      partial resection    HERNIA REPAIR      PR CLOSE ENTEROSTOMY,RESEC+COLOREC ANAS Midline 11/01/2019    Procedure: CLO ENTEROSTOMY; W/RESECT COLORECTAL ANASTOM;  Surgeon: Claretta Fraise, MD;  Location: MAIN OR [place];  Service: Gastrointestinal    PR COLONOSCOPY FLX DX W/COLLJ SPEC WHEN PFRMD Left 01/07/2013    Procedure: COLONOSCOPY, FLEXIBLE, PROXIMAL TO SPLENIC FLEXURE; DIAGNOSTIC, W/WO COLLECTION SPECIMEN BY BRUSH OR WASH;  Surgeon: Tish Men, MD;  Location: [facility];  Service: Gastroenterology    PR COLONOSCOPY FLX DX W/COLLJ SPEC WHEN PFRMD  03/09/2015    Procedure: COLONOSCOPY, FLEXIBLE, PROXIMAL TO SPLENIC FLEXURE; DIAGNOSTIC, W/WO COLLECTION SPECIMEN BY BRUSH OR WASH;  Surgeon: Cletis Athens, MD;  Location: GI [facility]URES [facility];  Service: Gastroenterology    PR COLONOSCOPY FLX DX W/COLLJ SPEC WHEN PFRMD N/A 10/28/2019    Procedure: COLONOSCOPY, FLEXIBLE, PROXIMAL TO SPLENIC FLEXURE; DIAGNOSTIC, W/WO COLLECTION SPECIMEN BY BRUSH OR WASH;  Surgeon: Beverly Milch, MD;  Location: [facility]URES [facility];  Service: Gastroenterology    PR EXPLORATORY OF ABDOMEN Midline 09/02/2019    Procedure: EXPLORATORY LAPAROTOMY, EXPLORATORY CELIOTOMY WITH OR WITHOUT BIOPSY(S);  Surgeon:  Claretta Fraise, MD;  Location: MAIN OR [facility];  Service: Gastrointestinal    PR EXPLORATORY OF ABDOMEN Midline 11/01/2019    Procedure: EXPLORATORY LAPAROTOMY, EXPLORATORY CELIOTOMY WITH OR WITHOUT BIOPSY(S);  Surgeon: Claretta Fraise, MD;  Location: MAIN OR [facility];  Service: Gastrointestinal    PR FIX FINGER,VOLAR PLATE,I-P JT Left 08/07/2022    Procedure: REPAIR AND RECONSTRUCTION, FINGER, VOLAR PLATE, INTERPHALANGEAL JOINT;  Surgeon: [name], MD;  [facility] OR [facility];  Service: Orthopedics    PR FREEING BOWEL ADHESION,ENTEROLYSIS N/A 11/01/2019    Procedure: Enterolysis (Separt Proc);  Surgeon: Claretta Fraise, MD;  Location: MAIN OR [facility];  Service: Gastrointestinal    PR ILEOSCOPY THRU STOMA,BIOPSY N/A 10/14/2019    Procedure: Karren Cobble; [place] Saa 1/MX;  Surgeon: Neysa Hotter, MD;  Location: [facility];  Service: Gastroenterology    PR PART REMOVAL COLON W COLOSTOMY Midline 09/02/2019    Procedure: COLECTOMY, PARTIAL; WITH SKIN LEVEL CECOSTOMY OR COLOSTOMY;  Surgeon: Claretta Fraise, MD;  Location: MAIN OR [facility];  Service: Gastrointestinal    PR REPAIR INCISIONAL HERNIA,REDUCIBLE Midline 09/02/2019    Procedure: REPAIR INIT INCISIONAL OR VENTRAL HERNIA; REDUCIBLE;  Surgeon: Kristopher Oppenheim, MD;  Location: MAIN OR [place];  Service: Trauma    PR REPAIR INTERCARP/CARP-METACARP JT Left 08/07/2022    Procedure: Jorene Guest;  Surgeon: [name], MD;  Location: MAIN OR [facility];  Service: Orthopedics    PR UPPER GI ENDOSCOPY,BIOPSY N/A 01/07/2013    Procedure: UGI ENDOSCOPY; WITH BIOPSY, SINGLE OR MULTIPLE;  Surgeon: Tish Men, MD;  Location: [facility];  Service: Gastroenterology    PR UPPER GI ENDOSCOPY,BIOPSY N/A 10/14/2019    Procedure: UGI ENDOSCOPY; WITH BIOPSY, SINGLE OR MULTIPLE;  Surgeon: Neysa Hotter, MD;  Location: GI [facility]URES [facility];  Service: Gastroenterology         Current Facility-Administered Medications:     HYDROmorphone (PF) (DILAUDID) injection 1 mg, 1 mg, Intravenous, Once, Eppie Gibson, MD    Current Outpatient Medications:     ADALIMUMAB PEN CITRATE FREE 40 MG/0.4 ML, Inject the contents of 1 pen (40 mg total) under the skin every seven (7) days., Disp: 4 each, Rfl: 5    ascorbic acid, vitamin C, (VITAMIN C) 250 MG tablet, Take 1 tablet (250 mg total) by mouth every other day. Take with oral iron, Disp: 30 tablet, Rfl: 11    B complex vitamins (B COMPLEX-VITAMIN B12) tablet, Take 1 tablet by mouth daily., Disp: 30 tablet, Rfl: 11    budesonide-formoterol (SYMBICORT) 80-4.5 mcg/actuation inhaler, Inhale 2 puffs two (2) times a day. (Patient not taking: Reported on 08/07/2022), Disp: , Rfl:     calcium carbonate (TUMS) 200 mg calcium (500 mg) chewable tablet, Chew 2 tablets (400 mg of elem calcium total) two (2) times a day., Disp: 60 tablet, Rfl: 0    cholecalciferol, vitamin D3 25 mcg, 1,000 units,, 1,000 unit (25 mcg) tablet, Take 1 tablet (25 mcg total) by mouth daily., Disp: 30 tablet, Rfl: 0    empty container Misc, Use as directed to dispose of Humira, Disp: 1 each, Rfl: 3    ferrous sulfate 325 (65 FE) MG tablet, Take 1 tablet (325 mg total) by mouth every other day., Disp: , Rfl: 0    fluticasone propionate (FLONASE) 50 mcg/actuation nasal spray, 1 spray into each nostril every morning., Disp: , Rfl:     magnesium oxide (MAG-OX) 400 mg (241.3 mg elemental magnesium) tablet, Take 1 tablet (  400 mg total) by mouth two (2) times a day., Disp: 60 tablet, Rfl: 0    nicotine (NICODERM CQ) 21 mg/24 hr patch, Place 1 patch on the skin daily., Disp: 28 patch, Rfl: 0    pantoprazole (PROTONIX) 40 MG tablet, Take 1 tablet (40 mg total) by mouth daily., Disp: , Rfl:     QUEtiapine (SEROQUEL) 100 MG tablet, Take 1 tablet (100 mg total) by mouth nightly., Disp: , Rfl:     Allergies  Morphine and Toradol [ketorolac]    Family History   Problem Relation Age of Onset    Diabetes Maternal Aunt     Crohn's disease Neg Hx        Social History  Social History     Tobacco Use    Smoking status: Former     Current packs/day: 0.00     Types: Cigarettes     Quit date: 10/02/2001     Years since quitting: 20.9    Smokeless tobacco: Never    Tobacco comments:     Motivated to quit. Smokes 0.5ppd but relights each cig   Vaping Use    Vaping status: Never Used   Substance Use Topics    Alcohol use: No     Alcohol/week: 0.0 standard drinks of alcohol    Drug use: Yes     Types: Marijuana     Comment: Denies currently, once a month        Radiology     CT Abdomen Pelvis W IV Contrast   Preliminary Result   -Mucosal hyperenhancement and wall thickening involving the neoterminal ileum. Of note, this finding is relatively unchanged compared to study from 07/25/2022. Unclear if this represent sequela of chronic inflammatory changes versus acute on chronic process. Recommend correlation with clinical exam and laboratory markers. Additionally, there is mild thickening of the sigmoid colon. This finding is nonspecific and may be secondary to underdistention, but could also represent inflammatory changes.      - Mild dilation of the neoterminal ileum prior to the anastomosis, similar to prior. This finding is relatively unchanged from 04/04/2022. Findings may be suggestive of some degree of stricturing at the anastomotic site.              Laboratory Data     Lab Results Component Value Date    WBC 3.8 09/22/2022    HGB 12.1 (L) 09/22/2022    HCT 37.9 (L) 09/22/2022    PLT 275 09/22/2022       Lab Results   Component Value Date    NA 143 09/22/2022    K 3.7 09/22/2022    CL 116 (H) 09/22/2022    CO2 23.0 09/22/2022    BUN 10 09/22/2022    CREATININE 0.92 09/22/2022    GLU 77 09/22/2022    CALCIUM 8.3 (L) 09/22/2022    MG 1.6 09/02/2022    PHOS 2.7 12/17/2020       Lab Results   Component Value Date    BILITOT 0.2 (L) 09/22/2022    BILIDIR 0.10 04/15/2022    PROT 5.5 (L) 09/22/2022    ALBUMIN 2.3 (L) 09/22/2022    ALT 14 09/22/2022    AST 17 09/22/2022    ALKPHOS 120 (H) 09/22/2022    GGT 57 01/05/2013       Lab Results   Component Value Date    LABPROT 12.0 01/08/2013    INR 0.99 11/01/2019    APTT 30.0 10/21/2019  Pertinent labs & imaging results that were available during my care of the patient were reviewed by me and considered in my medical decision making (see chart for details).    Portions of this record have been created using [employer]ist, clinical (histocompatibility and immunogenetics). Dictation errors have been sought, but may not have been identified and corrected.       Eppie Gibson, MD  Resident  09/22/22 [phone]

## 2022-09-22 NOTE — Unmapped (Signed)
Patient discharged at this time in no acute distress. Patient provided education on workup completed in ED as well as follow up care. Patient verbalized understanding teaching. Patient ambulated out of ED with even steady gait

## 2022-09-28 LAB — PROTIME-INR
INR: 0.92
PROTIME: 10.3 s (ref 9.9–12.6)

## 2022-09-28 LAB — CBC W/ AUTO DIFF
BASOPHILS ABSOLUTE COUNT: 0 10*9/L (ref 0.0–0.1)
BASOPHILS RELATIVE PERCENT: 1 %
EOSINOPHILS ABSOLUTE COUNT: 0.1 10*9/L (ref 0.0–0.5)
EOSINOPHILS RELATIVE PERCENT: 3 %
HEMATOCRIT: 38 % — ABNORMAL LOW (ref 39.0–48.0)
HEMOGLOBIN: 12.7 g/dL — ABNORMAL LOW (ref 12.9–16.5)
LYMPHOCYTES ABSOLUTE COUNT: 1.8 10*9/L (ref 1.1–3.6)
LYMPHOCYTES RELATIVE PERCENT: 42 %
MEAN CORPUSCULAR HEMOGLOBIN CONC: 33.4 g/dL (ref 32.0–36.0)
MEAN CORPUSCULAR HEMOGLOBIN: 30.8 pg (ref 25.9–32.4)
MEAN CORPUSCULAR VOLUME: 92.3 fL (ref 77.6–95.7)
MEAN PLATELET VOLUME: 8 fL (ref 6.8–10.7)
MONOCYTES ABSOLUTE COUNT: 0.4 10*9/L (ref 0.3–0.8)
MONOCYTES RELATIVE PERCENT: 9.6 %
NEUTROPHILS ABSOLUTE COUNT: 1.9 10*9/L (ref 1.8–7.8)
NEUTROPHILS RELATIVE PERCENT: 44.4 %
PLATELET COUNT: 274 10*9/L (ref 150–450)
RED BLOOD CELL COUNT: 4.12 10*12/L — ABNORMAL LOW (ref 4.26–5.60)
RED CELL DISTRIBUTION WIDTH: 16.8 % — ABNORMAL HIGH (ref 12.2–15.2)
WBC ADJUSTED: 4.3 10*9/L (ref 3.6–11.2)

## 2022-09-28 LAB — SEDIMENTATION RATE: ERYTHROCYTE SEDIMENTATION RATE: 38 mm/h — ABNORMAL HIGH (ref 0–15)

## 2022-09-28 LAB — LACTATE SEPSIS, VENOUS: LACTATE BLOOD VENOUS: 1.4 mmol/L (ref 0.5–1.8)

## 2022-09-29 ENCOUNTER — Encounter
Admit: 2022-09-29 | Discharge: 2022-10-02 | Payer: MEDICAID | Attending: Emergency Medicine | Primary: Emergency Medicine

## 2022-09-29 ENCOUNTER — Encounter
Admit: 2022-09-29 | Discharge: 2022-10-02 | Disposition: A | Discharge status: Home / Self Care | Payer: MEDICAID | Attending: Specialist

## 2022-09-29 ENCOUNTER — Encounter
Admit: 2022-09-29 | Discharge: 2022-10-02 | Disposition: A | Discharge status: Home / Self Care | Payer: MEDICAID | Attending: Student in an Organized Health Care Education/Training Program | Primary: Student in an Organized Health Care Education/Training Program

## 2022-09-29 ENCOUNTER — Ambulatory Visit: Admit: 2022-09-29 | Discharge: 2022-10-02 | Disposition: A | Discharge status: Home / Self Care | Payer: MEDICAID

## 2022-09-29 ENCOUNTER — Ambulatory Visit: Admit: 2022-09-29 | Discharge: 2022-10-02 | Payer: MEDICAID

## 2022-09-29 LAB — HEPATIC FUNCTION PANEL
ALBUMIN: 0.9 g/dL — ABNORMAL LOW (ref 3.4–5.0)
ALKALINE PHOSPHATASE: 34 U/L — ABNORMAL LOW (ref 46–116)
BILIRUBIN DIRECT: <0.1 mg/dL (ref 0.00–0.30)
BILIRUBIN TOTAL: <0.2 mg/dL — ABNORMAL LOW (ref 0.3–1.2)
PROTEIN TOTAL: 2.4 g/dL — ABNORMAL LOW (ref 5.7–8.2)

## 2022-09-29 LAB — BASIC METABOLIC PANEL
ANION GAP: 7 mmol/L (ref 5–14)
BLOOD UREA NITROGEN: <5 mg/dL — ABNORMAL LOW (ref 9–23)
CALCIUM: 3.2 mg/dL — CL (ref 8.7–10.4)
CHLORIDE: 129 mmol/L — ABNORMAL HIGH (ref 98–107)
CO2: 11 mmol/L — CL (ref 20.0–31.0)
CREATININE: 0.46 mg/dL — ABNORMAL LOW
EGFR CKD-EPI (2021) MALE: >90 mL/min/{1.73_m2} (ref >=60)
GLUCOSE RANDOM: 69 mg/dL — ABNORMAL LOW (ref 70–179)
SODIUM: 147 mmol/L — ABNORMAL HIGH (ref 135–145)

## 2022-09-29 LAB — COMPREHENSIVE METABOLIC PANEL
ALBUMIN: 2.3 g/dL — ABNORMAL LOW (ref 3.4–5.0)
ALKALINE PHOSPHATASE: 121 U/L — ABNORMAL HIGH (ref 46–116)
ALT (SGPT): 10 U/L (ref 10–49)
ANION GAP: 7 mmol/L (ref 5–14)
AST (SGOT): 19 U/L (ref <=34)
BILIRUBIN TOTAL: 0.2 mg/dL — ABNORMAL LOW (ref 0.3–1.2)
BLOOD UREA NITROGEN: 11 mg/dL (ref 9–23)
BUN / CREAT RATIO: 12
CALCIUM: 8.1 mg/dL — ABNORMAL LOW (ref 8.7–10.4)
CHLORIDE: 113 mmol/L — ABNORMAL HIGH (ref 98–107)
CO2: 23 mmol/L (ref 20.0–31.0)
CREATININE: 0.91 mg/dL
EGFR CKD-EPI (2021) MALE: >90 mL/min/{1.73_m2} (ref >=60)
GLUCOSE RANDOM: 90 mg/dL (ref 70–179)
POTASSIUM: 4 mmol/L (ref 3.4–4.8)
PROTEIN TOTAL: 5.5 g/dL — ABNORMAL LOW (ref 5.7–8.2)
SODIUM: 143 mmol/L (ref 135–145)

## 2022-09-29 LAB — CBC W/ AUTO DIFF
BASOPHILS ABSOLUTE COUNT: 0 10*9/L (ref 0.0–0.1)
BASOPHILS RELATIVE PERCENT: 0.9 %
EOSINOPHILS ABSOLUTE COUNT: 0.1 10*9/L (ref 0.0–0.5)
EOSINOPHILS RELATIVE PERCENT: 3.6 %
HEMATOCRIT: 34.1 % — ABNORMAL LOW (ref 39.0–48.0)
HEMOGLOBIN: 11.1 g/dL — ABNORMAL LOW (ref 12.9–16.5)
LYMPHOCYTES ABSOLUTE COUNT: 1.8 10*9/L (ref 1.1–3.6)
LYMPHOCYTES RELATIVE PERCENT: 50.2 %
MEAN CORPUSCULAR HEMOGLOBIN CONC: 32.7 g/dL (ref 32.0–36.0)
MEAN CORPUSCULAR HEMOGLOBIN: 30 pg (ref 25.9–32.4)
MEAN CORPUSCULAR VOLUME: 91.9 fL (ref 77.6–95.7)
MEAN PLATELET VOLUME: 8.2 fL (ref 6.8–10.7)
MONOCYTES ABSOLUTE COUNT: 0.4 10*9/L (ref 0.3–0.8)
MONOCYTES RELATIVE PERCENT: 11.4 %
NEUTROPHILS ABSOLUTE COUNT: 1.2 10*9/L — ABNORMAL LOW (ref 1.8–7.8)
NEUTROPHILS RELATIVE PERCENT: 33.9 %
PLATELET COUNT: 239 10*9/L (ref 150–450)
RED BLOOD CELL COUNT: 3.71 10*12/L — ABNORMAL LOW (ref 4.26–5.60)
RED CELL DISTRIBUTION WIDTH: 16.7 % — ABNORMAL HIGH (ref 12.2–15.2)
WBC ADJUSTED: 3.6 10*9/L (ref 3.6–11.2)

## 2022-09-29 LAB — MAGNESIUM
MAGNESIUM: 2 mg/dL (ref 1.6–2.6)
MAGNESIUM: 0.9 mg/dL — CL (ref 1.6–2.6)

## 2022-09-29 LAB — C-REACTIVE PROTEIN: C-REACTIVE PROTEIN: 29 mg/L — ABNORMAL HIGH (ref <=10.0)

## 2022-09-29 LAB — LIPASE: LIPASE: 27 U/L (ref 12–53)

## 2022-09-29 MED ADMIN — HYDROmorphone (PF) injection Syrg 0.5 mg: 0.5 mg | INTRAVENOUS | @ 08:00:00 | Stop: 2022-09-29

## 2022-09-29 MED ADMIN — pantoprazole (Protonix) EC tablet 40 mg: 40 mg | ORAL | @ 14:00:00

## 2022-09-29 MED ADMIN — promethazine (PHENERGAN) tablet 12.5 mg: 12.5 mg | ORAL | @ 14:00:00

## 2022-09-29 MED ADMIN — metoclopramide (REGLAN) injection 10 mg: 10 mg | INTRAVENOUS | @ 05:00:00 | Stop: 2022-09-28

## 2022-09-29 MED ADMIN — lactated ringers bolus 1,000 mL: 1000 mL | INTRAVENOUS | @ 05:00:00

## 2022-09-29 MED ADMIN — HYDROmorphone (PF) injection Syrg 0.5 mg: 0.5 mg | INTRAVENOUS | @ 11:00:00 | Stop: 2022-09-29

## 2022-09-29 MED ADMIN — QUEtiapine (SEROquel) tablet 100 mg: 100 mg | ORAL | @ 08:00:00

## 2022-09-29 MED ADMIN — acetaminophen (TYLENOL) tablet 650 mg: 650 mg | ORAL | @ 14:00:00

## 2022-09-29 MED ADMIN — lactated ringers bolus 1,000 mL: 1000 mL | INTRAVENOUS | @ 14:00:00 | Stop: 2022-09-29

## 2022-09-29 MED ADMIN — ondansetron (ZOFRAN-ODT) disintegrating tablet 4 mg: 4 mg | ORAL | @ 11:00:00 | Stop: 2022-09-29

## 2022-09-29 MED ADMIN — enoxaparin (LOVENOX) syringe 40 mg: 40 mg | SUBCUTANEOUS | @ 14:00:00

## 2022-09-29 MED ADMIN — pantoprazole (Protonix) injection 40 mg: 40 mg | INTRAVENOUS | @ 05:00:00 | Stop: 2022-09-28

## 2022-09-29 MED ADMIN — oxyCODONE (ROXICODONE) immediate release tablet 10 mg: 10 mg | ORAL | @ 22:00:00 | Stop: 2022-10-13

## 2022-09-29 MED ADMIN — promethazine (PHENERGAN) tablet 12.5 mg: 12.5 mg | ORAL | @ 22:00:00

## 2022-09-29 MED ADMIN — oxyCODONE (ROXICODONE) immediate release tablet 10 mg: 10 mg | ORAL | @ 14:00:00 | Stop: 2022-10-13

## 2022-09-29 MED ADMIN — fluticasone furoate-vilanterol (BREO ELLIPTA) 100-25 mcg/dose inhaler 1 puff: 1 | RESPIRATORY_TRACT | @ 14:00:00

## 2022-09-29 NOTE — Unmapped (Signed)
[facility]  Emergency Department Provider Note    ED Clinical Impression     Final diagnoses:   Right lower quadrant abdominal pain (Primary)       HPI, ED Course, Assessment and Plan     Initial Clinical Impression:    September 28, 2022 10:56 PM   Jerome Kennedy. is a 47 y.o. male with past medical history of Crohn's disease on Humira with history of multiple intra-abdominal surgeries presenting with right-sided lower abdominal pain for the past week with worsening in the last 3 days and several episodes per day of vomiting with 1 episode today with a small streak of blood otherwise nonbloody.  He states that he has bright red streaks of blood in his vomitus and in his stool.  Denies dark stools, excessive NSAID use, alcohol use.  States that his abdominal symptoms are similar to flare of Crohn's however he presents for evaluation due to fever to 102.1 at home.  He took ibuprofen prior to arrival.  Patient has no concerns for new perirectal pain, skin tags, or fistulas.      Patient was seen 09/22/2022 and CT abdomen pelvis at that time demonstrated mucosal hyperenhancement and wall thickening involving the neoterminal ileum consistent with acute flare of Crohn's.      BP 114/76  - Pulse 87  - Temp 36.3 ??C (97.3 ??F) (Oral)  - Resp 18  - Wt 65.8 kg (145 lb)  - SpO2 94%  - BMI 22.05 kg/m??     Medical Decision Making  Differential diagnosis includes but is not limited to Crohn's flare, stricture, gastroenteritis, appendicitis, pyelonephritis, among other etiologies.  Given this patient's persistent symptoms and inadequate pain and nausea control at home, plan to admit patient for further management of Crohn's flare.  Do not feel that he requires further imaging of his abdomen as he has no peritoneal signs at this time and there is no evidence of acute abdomen.  Given history of one episode of bloody emesis will give protonix however will defer further workup at this time unless patient has further episodes here, Hgb is stable, no evidence of hemorraghic shock or melena.     Further ED updates and updates to plan as per ED Course below:    ED Course:  ED Course as of 09/29/22 0506   Sun Sep 28, 2022   2350 CBC without leukocytosis, stable anemia, ESR noted to be elevated.  Lactate within normal limits.  Overall less concerning for new complication such as fistula or abscess formation.   Mon Sep 29, 2022   0005 I have paged this patient out for admission at this time.   0014 EKG on my interpretation with normal sinus rhythm, rate of 82, normal axis, appropriate intervals, overall without ST-T changes concerning for acute ischemia or abnormal T wave inversions.   0127 CMP without actionable electrolyte abnormalities, CRP also noted to be elevated     Independent Interpretation of Studies: I have independently interpreted the following studies:  EKG    Discussion of Management With Other Providers or Support Staff: I discussed the management of this patient with the:  Medical admitting officer    Considerations Regarding Disposition/Escalation of Care and Critical Care:  Patient to be admitted to the hospital at this time for further management    Social Determinants of Health with Concerns     Internet Connectivity: Not on file   Food Insecurity: Food Insecurity Present (08/20/2022)    Hunger Vital Sign  Worried About Programme researcher, broadcasting/film/video in the Last Year: Sometimes true     Ran Out of Food in the Last Year: Sometimes true   Tobacco Use: Medium Risk (09/28/2022)    Patient History     Smoking Tobacco Use: Former     Smokeless Tobacco Use: Never     Passive Exposure: Not on file   Housing/Utilities: High Risk (08/20/2022)    Housing/Utilities     Within the past 12 months, have you ever stayed: outside, in a car, in a tent, in an overnight shelter, or temporarily in someone else's home (i.e. couch-surfing)?: Yes     Are you worried about losing your housing?: Yes     Within the past 12 months, have you been unable to get utilities (heat, electricity) when it was really needed?: No   Alcohol Use: Not on file   Substance Use: Not on file   Health Literacy: Not on file   Physical Activity: Not on File (06/21/2021)    Received from [facility]     Physical Activity     Physical Activity: 0   Interpersonal Safety: Not on file   Stress: Not on File (06/21/2021)    Received from [facility]     Stress     Stress: 0   Intimate Partner Violence: Not on file   Depression: Not on file   Social Connections: Not on File (06/21/2021)    Received from [facility]     Social Connections     Social Connections and Isolation: 0     _____________________________________________________________________    The case was discussed with the attending physician who is in agreement with the above assessment and plan    Past History     PAST MEDICAL HISTORY/PAST SURGICAL HISTORY:   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    Myalgia     SBO (small bowel obstruction) (CMS-HCC) 2011       Past Surgical History:   Procedure Laterality Date    CHOLECYSTECTOMY      COLON SURGERY      partial resection    HERNIA REPAIR      PR CLOSE ENTEROSTOMY,RESEC+COLOREC ANAS Midline 11/01/2019    Procedure: CLO ENTEROSTOMY; W/RESECT COLORECTAL ANASTOM;  Surgeon: Claretta Fraise, MD;  Location: MAIN OR [place];  Service: Gastrointestinal    PR COLONOSCOPY FLX DX W/COLLJ SPEC WHEN PFRMD Left 01/07/2013    Procedure: COLONOSCOPY, FLEXIBLE, PROXIMAL TO SPLENIC FLEXURE; DIAGNOSTIC, W/WO COLLECTION SPECIMEN BY BRUSH OR WASH;  Surgeon: Tish Men, MD;  Location: [facility]URES [facility];  Service: Gastroenterology    PR COLONOSCOPY FLX DX W/COLLJ SPEC WHEN PFRMD  03/09/2015    Procedure: COLONOSCOPY, FLEXIBLE, PROXIMAL TO SPLENIC FLEXURE; DIAGNOSTIC, W/WO COLLECTION SPECIMEN BY BRUSH OR WASH;  Surgeon: Cletis Athens, MD;  Location: GI PROCEDURES [facility];  Service: Gastroenterology    PR COLONOSCOPY FLX DX W/COLLJ SPEC WHEN PFRMD N/A 10/28/2019    Procedure: COLONOSCOPY, FLEXIBLE, PROXIMAL TO SPLENIC  FLEXURE; DIAGNOSTIC, W/WO COLLECTION SPECIMEN BY BRUSH OR WASH;  Surgeon: Beverly Milch, MD;  Location: GI PROC[facility]URES [facility];  Service: Gastroenterology    PR EXPLORATORY OF ABDOMEN Midline 09/02/2019    Procedure: EXPLORATORY LAPAROTOMY, EXPLORATORY CELIOTOMY WITH OR WITHOUT BIOPSY(S);  Surgeon: Claretta Fraise, MD;  Location: MAIN OR [facility];  Service: Gastrointestinal    PR EXPLORATORY OF ABDOMEN Midline 11/01/2019    Procedure: EXPLORATORY LAPAROTOMY, EXPLORATORY CELIOTOMY WITH OR WITHOUT BIOPSY(S);  Surgeon: Claretta Fraise, MD;  Location: MAIN OR [facility];  Service: Gastrointestinal    PR FIX FINGER,VOLAR PLATE,I-P JT Left 08/07/2022    Procedure: REPAIR AND RECONSTRUCTION, FINGER, VOLAR PLATE, INTERPHALANGEAL JOINT;  Surgeon: [name], MD;  Location: MAIN OR [facility];  Service: Orthopedics    PR FREEING BOWEL ADHESION,ENTEROLYSIS N/A 11/01/2019    Procedure: Enterolysis (Separt Proc);  Surgeon: Claretta Fraise, MD;  Location: MAIN OR [facility];  Service: Gastrointestinal    PR ILEOSCOPY THRU STOMA,BIOPSY N/A 10/14/2019    Procedure: Karren Cobble; [place] Saa 1/MX;  Surgeon: Neysa Hotter, MD;  Location: GI PROC[facility]URES [facility];  Service: Gastroenterology    PR PART REMOVAL COLON W COLOSTOMY Midline 09/02/2019    Procedure: COLECTOMY, PARTIAL; WITH SKIN LEVEL CECOSTOMY OR COLOSTOMY;  Surgeon: Claretta Fraise, MD;  Location: MAIN OR [facility];  Service: Gastrointestinal    PR REPAIR INCISIONAL HERNIA,REDUCIBLE Midline 09/02/2019    Procedure: REPAIR INIT INCISIONAL OR VENTRAL HERNIA; REDUCIBLE;  Surgeon: Kristopher Oppenheim, MD;  Location: MAIN OR [place];  Service: Trauma    PR REPAIR INTERCARP/CARP-METACARP JT Left 08/07/2022    Procedure: Jorene Guest;  Surgeon: [name], MD;  Location: MAIN OR [facility];  Service: Orthopedics    PR UPPER GI ENDOSCOPY,BIOPSY N/A 01/07/2013    Procedure: UGI ENDOSCOPY; WITH BIOPSY, SINGLE OR MULTIPLE;  Surgeon: Tish Men, MD;  [facility];  Service: Gastroenterology    PR UPPER GI ENDOSCOPY,BIOPSY N/A 10/14/2019    Procedure: UGI ENDOSCOPY; WITH BIOPSY, SINGLE OR MULTIPLE;  Surgeon: Neysa Hotter, MD;  Location: GI PROCEDURES [facility];  Service: Gastroenterology       MEDICATIONS:     Current Facility-Administered Medications:     enoxaparin (LOVENOX) syringe 40 mg, 40 mg, Subcutaneous, Q24H, Damjanac, Tamara, MD    fluticasone furoate-vilanterol (BREO ELLIPTA) 100-25 mcg/dose inhaler 1 puff, 1 puff, Inhalation, Daily (RT), Damjanac, Tamara, MD    HYDROmorphone (PF) injection Syrg 0.5 mg, 0.5 mg, Intravenous, Q3H PRN, Ripley Fraise, MD, 0.5 mg at 09/29/22 0255    pantoprazole (Protonix) EC tablet 40 mg, 40 mg, Oral, Daily, Damjanac, Tamara, MD    QUEtiapine (SEROquel) tablet 100 mg, 100 mg, Oral, Nightly, Damjanac, Tamara, MD, 100 mg at 09/29/22 1610    Current Outpatient Medications:     ADALIMUMAB PEN CITRATE FREE 40 MG/0.4 ML, Inject the contents of 1 pen (40 mg total) under the skin every seven (7) days., Disp: 4 each, Rfl: 5    ascorbic acid, vitamin C, (VITAMIN C) 250 MG tablet, Take 1 tablet (250 mg total) by mouth every other day. Take with oral iron, Disp: 30 tablet, Rfl: 11    B complex vitamins (B COMPLEX-VITAMIN B12) tablet, Take 1 tablet by mouth daily., Disp: 30 tablet, Rfl: 11    budesonide-formoterol (SYMBICORT) 80-4.5 mcg/actuation inhaler, Inhale 2 puffs two (2) times a day. (Patient not taking: Reported on 08/07/2022), Disp: , Rfl:     calcium carbonate (TUMS) 200 mg calcium (500 mg) chewable tablet, Chew  2 tablets (400 mg of elem calcium total) two (2) times a day., Disp: 60 tablet, Rfl: 0    cholecalciferol, vitamin D3 25 mcg, 1,000 units,, 1,000 unit (25 mcg) tablet, Take 1 tablet (25 mcg total) by mouth daily., Disp: 30 tablet, Rfl: 0    empty container Misc, Use as directed to dispose of Humira, Disp: 1 each, Rfl: 3    ferrous sulfate 325 (65 FE) MG tablet, Take 1 tablet (325 mg total) by mouth every other day., Disp: , Rfl: 0    fluticasone propionate (FLONASE) 50 mcg/actuation nasal spray, 1 spray into each nostril every morning., Disp: , Rfl:     magnesium oxide (MAG-OX) 400 mg (241.3 mg elemental magnesium) tablet, Take 1 tablet (400 mg total) by mouth two (2) times a day., Disp: 60 tablet, Rfl: 0    nicotine (NICODERM CQ) 21 mg/24 hr patch, Place 1 patch on the skin daily., Disp: 28 patch, Rfl: 0    pantoprazole (PROTONIX) 40 MG tablet, Take 1 tablet (40 mg total) by mouth daily., Disp: , Rfl:     QUEtiapine (SEROQUEL) 100 MG tablet, Take 1 tablet (100 mg total) by mouth nightly., Disp: , Rfl:     ALLERGIES:   Morphine and Toradol [ketorolac]    SOCIAL HISTORY:   Social History     Tobacco Use    Smoking status: Former     Current packs/day: 0.00     Types: Cigarettes     Quit date: 10/02/2001     Years since quitting: 21.0    Smokeless tobacco: Never    Tobacco comments:     Motivated to quit. Smokes 0.5ppd but relights each cig   Substance Use Topics    Alcohol use: No     Alcohol/week: 0.0 standard drinks of alcohol       FAMILY HISTORY:  Family History   Problem Relation Age of Onset    Diabetes Maternal Aunt     Crohn's disease Neg Hx           Review of Systems     A review of systems was performed and relevant portions were as noted above in HPI     Physical Exam     VITAL SIGNS:    BP 114/76  - Pulse 87  - Temp 36.3 ??C (97.3 ??F) (Oral)  - Resp 18  - Wt 65.8 kg (145 lb)  - SpO2 94%  - BMI 22.05 kg/m??     Constitutional:   Alert and oriented.  In mild acute distress secondary to pain  Head:   Normocephalic and atraumatic  Eyes:   Conjunctivae are normal, EOMI, PERRL  ENT:   No notable congestion, Mucous membranes moist, External ears normal, no notable stridor  Cardiovascular:   Rate as vitals above. Appears warm and well perfused.  No murmurs rubs or gallops appreciated  Respiratory:   Normal respiratory effort. Breath sounds are normal.  Gastrointestinal:   Soft, non-distended, and mildly tender to palpation in the right lower quadrant without rebound or guarding.   Easily reducible soft umbilical hernia   Genitourinary:   Deferred  Musculoskeletal:    Normal range of motion in all extremities. No tenderness or edema noted in B/L lower extremities  Neurologic:   No gross focal neurologic deficits beyond baseline are appreciated.  Skin:   Skin is warm, dry and intact.       Radiology     No orders to display  Labs     Labs Reviewed   COMPREHENSIVE METABOLIC PANEL - Abnormal; Notable for the following components:       Result Value    Chloride 113 (*)     Calcium 8.1 (*)     Albumin 2.3 (*)     Total Protein 5.5 (*)     Total Bilirubin 0.2 (*)     Alkaline Phosphatase 121 (*)     All other components within normal limits   SEDIMENTATION RATE - Abnormal; Notable for the following components:    Sed Rate 38 (*)     All other components within normal limits   C-REACTIVE PROTEIN - Abnormal; Notable for the following components:    CRP 29.0 (*)     All other components within normal limits   CBC W/ AUTO DIFF - Abnormal; Notable for the following components:    RBC 4.12 (*)     HGB 12.7 (*)     HCT 38.0 (*)     RDW 16.8 (*)     Anisocytosis Slight (*)     All other components within normal limits   INFLUENZA/RSV/COVID PCR - Normal    Narrative:     This test was performed using the Cepheid Xpert Xpress CoV-2/Flu/RSV plus assay, which has been validated by the CLIA-certified, CAP-inspected [employer] FDA has granted Emergency Use Authorization for this test. Negative results do not preclude infection and should be interpreted along with clinical observations, patient history, and epidemiological information. Information for providers and patients can be found here: [email or web address]   LIPASE - Normal   LACTATE SEPSIS, VENOUS - Normal   MAGNESIUM - Normal   BLOOD CULTURE   BLOOD CULTURE   CLOSTRIDIUM DIFFICILE ASSAY   GI PATHOGEN PANEL   CALPROTECTIN, STOOL   CBC W/ DIFFERENTIAL    Narrative:     The following orders were created for panel order CBC w/ Differential.                  Procedure                               Abnormality         Status                                     ---------                               -----------         ------                                     CBC w/ Differential[[id number]]         Abnormal            Final result                                                 Please view results for these tests on the individual orders.   PROTIME-INR   CBC W/ DIFFERENTIAL    Narrative:  The following orders were created for panel order CBC w/ Differential.                  Procedure                               Abnormality         Status                                     ---------                               -----------         ------                                     CBC w/ Differential[[id number]]                                                                                          Please view results for these tests on the individual orders.   BASIC METABOLIC PANEL   MAGNESIUM   HEPATIC FUNCTION PANEL   TYPE AND SCREEN   CBC W/ AUTO DIFF         Pertinent labs & imaging results that were available during my care of the patient were reviewed by me and considered in my medical decision making (see chart for details).    Please note- This chart has been created using [employer]. Chart creation errors have been sought, but may not always be located and such creation errors, especially pronoun confusion, do NOT reflect on the standard of medical care.       Ripley Fraise, MD  Resident  09/29/22 [phone]

## 2022-09-29 NOTE — Unmapped (Signed)
Pt ml abdominal pain x 3 days, n/v x 3 today. Pt endorses streaks of blood in vomit. Pt reports blood in stool. Pt reports fever at home 102.1 @ 1800 tonight, for which he took ibuprofen. Pt has PMH of crohn's.

## 2022-09-29 NOTE — Unmapped (Signed)
Gastroenterology ([facility]) Consult Service   Initial Consultation         Assessment and Recommendations:   Jerome Caron. is a 47 y.o. 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 The patient is seen in consultation at the request of Dolores Patty, MD ([facility] ([facility])) for nausea, vomiting, and diarrhea.    Assessment: Jerome Kennedy with a history of Crohn's disease who is currently on weekly Humira here for nausea and abdominal pain.  He was seen by the consult service in December and recommended for outpatient follow-up given he had been relatively stable and preferred an outpatient workup.  In that time, he has not seen his primary gastroenterologist but has come back to the emergency department multiple times.  His last colonoscopy was in 2021 and as such we recommend an inpatient colonoscopy to further evaluate how he is doing endoscopically.  We discussed this with him at length and he is adamantly refusing this procedure until he follows up with his primary gastroenterologist (Dr. [name] at [facility]).  Would defer changing his immunosuppression or starting steroids if we cannot prove that he is flaring endoscopically.  In the interim, reasonable to check Humira level/antibody and HLA-DQA05 to determine whether Humira is the best medication for him (we have ordered both of these).    Recommendations  -Follow-up C. difficile and GI pathogen level  - Recommended checking/following up Humira level/antibody and HLA DQA05 (we ordered these which will help outpatient primary gastroenterologist)  -We strongly recommended a colonoscopy to restage him however the patient adamantly refused this  - Occasion to start steroids or change immunosuppression without confirming this endoscopically  -Should the patient change his mind, we remain available and can come discuss colonoscopy with him      Issues Impacting Complexity of Management:  -None    Recommendations discussed with the patient's primary team. We will sign-off at this time, please re-contact if additional questions or a new need for consultation arises.    For questions, contact the on-call fellow for the Gastroenterology ([facility]) Consult Service.    Subjective:   Chart review shows pt was seen in 04/2022 and diagnosed with C. diff colitis, treated with 10 days vancomycin QID. At that time, he reported past management of his Crohn's disease with adalimumab though he had recently run out; he has been taking this medication since that visit. He also reported a distant history of infliximab trial of unknown duration, less than one year, discontinued due to lack of efficacy. Further review shows that pt was being considered for transition to ustekinumab (from adalimumab) due to disease progression, however this has not occurred yet. He has been seen in this ED 6 times since 07/2022 for RLQ abdominal pain, vomiting, and diarrhea. His last colonoscopy (10/28/2019 - scanned) showed many hemorrhoids, friable rectal mucosa, and inability to pass the scope beyond 40cm due to strictures/adhesions.    Pt states he was in his usual state of health until 2 days ago, when he began experiencing abdominal pain, loose stools, and vomiting, prompting his visit to the ED yesterday. He says that these stools and emesis have been blood-streaked but without large volume bleeding. He says he normally has 2 formed bowel movements every day;  during this episode, he has continued to have 2 bowel movements per day, however they are much looser than usual. He says he experienced a brief feeling of bloating 2 days ago which resolved spontaneously, but no period of constipation. He describes the pain as being in the right lower area of his abdomen, and characterizes the pain as similar to previous Crohn's flares. He says the pain does not feel like the pain he had when he was diagnosed with C. diff. He has been taking ibuprofen at home with minimal relief. He endorses subjective fever last night, denies chills.    He reports that his symptoms have been improving since the start of his ED visit yesterday, and he has been able to keep down some food/drinks, and says he is currently hungry.    -I have reviewed the patient's prior records from 04/2022-07/2022 as summarized in the HPI    Objective:   Temp:  [36.3 ??C (97.3 ??F)-36.7 ??C (98.1 ??F)] 36.6 ??C (97.9 ??F)  Heart Rate:  [71-98] 97  SpO2 Pulse:  [95] 95  Resp:  [16-18] 18  BP: (92-114)/(61-81) 92/61  SpO2:  [94 %-98 %] 96 %    Gen: WDWN male in NAD, answers questions appropriately.  Cardiovascular: Tachycardic to 120s after position change, reduced to 80s within one minute of sitting. Regular rhythm and no murmur.  Abdomen: Prominent ventral hernia, reducible and nontender. Exquisitely TTP in RLQ, mildly TTP in RUQ, nontender in all other quadrants. Soft, no rebound tenderness, no palpable hepatosplenomegaly  Extremities: No edema in the BLEs    Pertinent Labs/Studies:  -I have visualized the patient's CT A/P dated 2/19 which shows neoterminal ileum narrowing and dilation, relatively unchanged from studies in 07/2022 and 04/2022, unclear if evidence of chronic sequelae of IBD vs. acute-on-chronic process.  -I have reviewed the patient's labs from 2/25/-2/26 which show somewhat downtrending Hgb (12.7 -> 11.1),  WBC (4.3 -> 3.6), BUN/creatinine (11/0.91 -> <5/0.46), and uptrending Na and Cl (143 and 113 -> 147 and 129) most consistent with dilution from IVF.  CRP elevated to 29 (from 13 on 2/19), total protein low (5.5 -> 2.4), lactate WNL (1.4).

## 2022-09-29 NOTE — Unmapped (Shared)
Internal Medicine (MEDU) Progress Note    Assessment & Plan:   Jerome Kennedy. is a 47 y.o. male whose presentation is complicated by *** that presented to [facility] with ***.     Principal Problem:    Nausea & vomiting  Active Problems:    Crohn's disease (CMS-HCC)    Abdominal pain  Resolved Problems:    * No resolved hospital problems. *    {The problem list should be reviewed and updated in Epic admission navigator to reflect active medical issues:75688:::1}    Active Problems    ***      Chronic Problems    ***        Issues Impacting Complexity of Management:  {Issues Impacting Complexity of Management (Optional):96701}      {Medical Decision Making (Optional):97088}      Daily Checklist:  Diet: {JLH DIET:49409::Regular Diet}  DVT PPx: {JLHDVTPPXNEW:65522}  Electrolytes: {JLH NWGNF:62130}  Code Status: Full Code  Dispo: {JLH Dispo:49408}    Team Contact Information:   Primary Team: {JLH IM Services QMV:78469}  Primary Resident: Derald Macleod, MD  Resident's Pager: {IMSERVICEPAGERS:73620}    Interval History:   {JLH APSO - Subjective:49428}    {GEXBMW:41324}    Objective:   {JLH APSO - Objective:49430}    Gen: NAD, converses   HENT: atraumatic, normocephalic  Heart: RRR  Lungs: CTAB, no crackles or wheezes  Abdomen: soft, NTND  Extremities: No edema

## 2022-09-29 NOTE — Unmapped (Addendum)
Jerome Kennedy is a 47 y.o. man with a history of long standing complex ileocolonic Crohn's disease s/p prior ileocolonic resections currently treated with Humira, COPD, DVT, and bipolar disorder who presented on 2/25 to the [facility] ED with 2-days RLQ abdominal pain, nausea/vomiting and left hand swelling. He was admitted by [facility] for evaluation of RLQ abdominal pain and nausea/vomiting with concern for Crohns flare. He is now stable to discharge home with plan for outpatient GI follow-up.      Below are more details of his stay at [facility] by problem list:     1) RLQ abdominal pain/nausea/vomiting with chronic IBD  Jerome Kennedy presented with 2 days acute RLQ abdominal pain/nausea/vomiting that started on 2/23, with a CT A&P and concerning with a Crohn's flare. Patient has been coming to the ED for RLQ abdominal pain for the past month. Vitals/Labs were notable for an afebrile course, elevated CRP/SedRate and normal WBC. C. Diff and stool pathogen panel were ordered to exclude infection. GI was consulted and recommended colonoscopy to evaluate disease progression before possible medication changes. By 09/30/22, patient was able to tolerate PO intake and reported improved appetite, well-formed stools, decreased pain, no vomiting. Given improving disease course and lack of fever/white count, there was little concern for infection, peritonitis, fistula, obstruction, and abscess. Patient tolerated bowel prep via NG tube (2/27) well and and reported 3X bowel movements with clear watery stools overnight. Colonoscopy was performed on 2.28 but prep was poor and they could not visualize. GI recommendations post-colonoscopy were to continue current meds and then follow up as scheduled. Patient was discharged on 2.29, and was counseled on the importance of regular follow-up and has outpatient GI appointment scheduled on 10/17/22.     2) Left hand swelling  He presented with significant left hand swelling localized to the radial thumb and 1st MCP joint with history of left hand surgery on 08/07/2022. X-ray of left hand was consistent with post-operative soft tissue swelling and no evidence for soft tissue gas or other overt signs of acute infection. By 2/27, swelling had decreased significantly and hand was not red or warm. He denied pain or tenderness to palpation of the hand. Given improving course, swelling is most likely post-surgical. Patient has outpatient orthopedics follow-up scheduled for 10/08/22.    3) Hypocalcemia  Patient presented with serum Ca around 7.7-8.1 without symptoms of muscle aches or twitching. Patient was counseled on Ca/VitD supplements.      Chronic Problems    Ventral Hernia  Stable. Reducible ventral hernia was noted and was not tender to palpation.     Hypercoagulable state  Stable. Patient was given enoxaparin syringe 40 mg Q24 during the admission.     Bipolar Disorder  Stable. Patient was continued on quetiapine 100 mg once daily.

## 2022-09-29 NOTE — Unmapped (Signed)
Internal Medicine (MEDU) History & Physical    Assessment & Plan:   Jerome Orosco. is a 47 y.o. male whose presentation is complicated by Crohn's disease on Humira that presented to [facility] with RLQ abdominal pain and nausea.     Principal Problem:    Nausea & vomiting  Active Problems:    Crohn's disease (CMS-HCC)    Abdominal pain  Resolved Problems:    * No resolved hospital problems. *    Nausea and vomiting - RLQ abdominal pain - Crohn's on Humira  Follows with [facility], initial visit on 11/17. 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 He failed Remicade prior to Humira (which he current receives every 2 weeks). Last colonoscopy 2/23 showed no evidence of active disease. He has had 2 days of nausea and right lower quadrant pain in the setting of chronic diarrhea, which he says is consistent with previous flares. Kennedy treating symptoms while ruling out infectious causes of vomiting and diarrhea. Last CT A/P was on 2/19 and showed mucosal hyper-enhancement and wall thickening involving the neoterminal ileum with luminal narrowing. RLQ pain explained by ileal disease; low concern for peritonitis as he does not have a white count. He was discharged from the ED when his symptoms were under controlled.  - Consult GI in AM, appreciate assistance  - Follow-up fecal calprotectin  - Follow-up C diff and GIPP  - Follow-up blood cultures x2  - Consider steroids when infectious studies return      The patient's presentation is complicated by the following clinically significant conditions requiring additional evaluation and treatment: - Hypercoagulable state requiring additional attention to DVT prophylaxis and treatment       Checklist:  Diet: NPO  DVT PPx: Lovenox 40mg  q24h  Code Status: Full Code  Dispo: Patient appropriate for Inpatient based on expectation of ongoing need for hospitalization greater than two midnights based on severity of presentation/services including evaluation for possible Crohn's flare    Team Contact Information:   Primary Team: Internal Medicine (MEDU)  Primary Resident: Rick Duff, MD  Resident's Pager: [phone] [facility])    Chief Concern:   Nausea & vomiting    Subjective:   Jerome Warne. is a 47 y.o. male with pertinent PMHx of Crohn's disease on Humira presenting with RLQ abdominal pain and nausea for two days.     History obtained by patient.     HPI:  Jerome Kennedy says that he is here today because he has had two days of nausea and right lower quadrant abdominal pain. He had one episode of bloody vomit but has not had any episodes since being in the emergency department. He said that he has been having diarrhea that is greenish in color but this has been going on for the past month. He has not had any fever or chills. The last time this type of pain and nausea occurred was on 2/19 and it went away with no intervention. He says that this feels like a Crohn's flare.     In the ED, he was found to be afebrile, slightly hypotensive to 98/75 and satting well room air. He received 1 L LR, reglan, and pantoprazole. He was found to have an unremarkable CBC and BMP. LFTs negative. Lipase negative. CRP elevated to 29. Lactate negative.  Blood cultures x2 drawn.     Pertinent Surgical Hx  Past Surgical History:   Procedure Laterality Date    CHOLECYSTECTOMY      COLON SURGERY      partial resection    HERNIA REPAIR      PR CLOSE ENTEROSTOMY,RESEC+COLOREC ANAS Midline 11/01/2019    Procedure: CLO ENTEROSTOMY; W/RESECT COLORECTAL ANASTOM;  Surgeon: Claretta Fraise, MD;  Location: MAIN OR [place];  Service: Gastrointestinal    PR COLONOSCOPY FLX DX W/COLLJ SPEC WHEN PFRMD Left 01/07/2013    Procedure: COLONOSCOPY, FLEXIBLE, PROXIMAL TO SPLENIC FLEXURE; DIAGNOSTIC, W/WO COLLECTION SPECIMEN BY BRUSH OR WASH;  Surgeon: Tish Men, MD;  Location: GI PROCEDURES MEMORIAL [facility];  Service: Gastroenterology    PR COLONOSCOPY FLX DX W/COLLJ SPEC WHEN PFRMD  03/09/2015    Procedure: COLONOSCOPY, FLEXIBLE, PROXIMAL TO SPLENIC FLEXURE; DIAGNOSTIC, W/WO COLLECTION SPECIMEN BY BRUSH OR WASH;  Surgeon: Cletis Athens, MD;  Location: GI PROCEDURES MEMORIAL [facility];  Service: Gastroenterology    PR COLONOSCOPY FLX DX W/COLLJ SPEC WHEN PFRMD N/A 10/28/2019    Procedure: COLONOSCOPY, FLEXIBLE, PROXIMAL TO SPLENIC FLEXURE; DIAGNOSTIC, W/WO COLLECTION SPECIMEN BY BRUSH OR WASH;  Surgeon: Beverly Milch, MD;  Location: GI PROCEDURES MEMORIAL [facility];  Service: Gastroenterology    PR EXPLORATORY OF ABDOMEN Midline 09/02/2019    Procedure: EXPLORATORY LAPAROTOMY, EXPLORATORY CELIOTOMY WITH OR WITHOUT BIOPSY(S);  Surgeon: Claretta Fraise, MD;  Location: MAIN OR [facility];  Service: Gastrointestinal    PR EXPLORATORY OF ABDOMEN Midline 11/01/2019    Procedure: EXPLORATORY LAPAROTOMY, EXPLORATORY CELIOTOMY WITH OR WITHOUT BIOPSY(S);  Surgeon: Claretta Fraise, MD;  Location: MAIN OR [facility];  Service: Gastrointestinal    PR FIX FINGER,VOLAR PLATE,I-P JT Left 08/07/2022    Procedure: REPAIR AND RECONSTRUCTION, FINGER, VOLAR PLATE, INTERPHALANGEAL JOINT;  Surgeon: [name], MD;  Location: MAIN OR [facility];  Service: Orthopedics    PR FREEING BOWEL ADHESION,ENTEROLYSIS N/A 11/01/2019    Procedure: Enterolysis (Separt Proc);  Surgeon: Claretta Fraise, MD;  Location: MAIN OR [facility];  Service: Gastrointestinal    PR ILEOSCOPY THRU STOMA,BIOPSY N/A 10/14/2019    Procedure: Karren Cobble; [place] Saa 1/MX;  Surgeon: Neysa Hotter, MD;  Location: GI PROCEDURES MEMORIAL [facility];  Service: Gastroenterology    PR PART REMOVAL COLON W COLOSTOMY Midline 09/02/2019    Procedure: COLECTOMY, PARTIAL; WITH SKIN LEVEL CECOSTOMY OR COLOSTOMY;  Surgeon: Claretta Fraise, MD;  Location: MAIN OR [facility];  Service: Gastrointestinal PR REPAIR INCISIONAL HERNIA,REDUCIBLE Midline 09/02/2019    Procedure: REPAIR INIT INCISIONAL OR VENTRAL HERNIA; REDUCIBLE;  Surgeon: Kristopher Oppenheim, MD;  Location: MAIN OR [place];  Service: Trauma    PR REPAIR INTERCARP/CARP-METACARP JT Left 08/07/2022    Procedure: Jorene Guest;  Surgeon: [name], MD;  Location: MAIN OR [facility];  Service: Orthopedics    PR UPPER GI ENDOSCOPY,BIOPSY N/A 01/07/2013    Procedure: UGI ENDOSCOPY; WITH BIOPSY, SINGLE OR MULTIPLE;  Surgeon: Tish Men, MD;  Location: GI PROCEDURES [facility]ORIAL [facility];  Service: Gastroenterology    PR UPPER GI ENDOSCOPY,BIOPSY N/A 10/14/2019    Procedure: UGI ENDOSCOPY; WITH BIOPSY, SINGLE OR MULTIPLE;  Surgeon: Neysa Hotter, MD;  Location: GI PROCEDURES [facility];  Service: Gastroenterology     Pertinent Family Hx  No pertinent family history    Pertinent Social Hx   No alcohol use. 0.5 packs per day for the past 5 months. Occasional marijuana use.    Allergies  Morphine and Toradol [ketorolac]    I reviewed the Medication List.  The current list is Accurate  Prior to Admission medications    Medication Dose, Route, Frequency   ADALIMUMAB PEN CITRATE FREE 40 MG/0.4 ML Inject the contents of 1 pen (40 mg total) under the skin every seven (7) days.   ascorbic acid, vitamin C, (VITAMIN C) 250 MG tablet 250 mg, Oral, Every other day, Take with oral iron   B complex vitamins (B COMPLEX-VITAMIN B12) tablet 1 tablet, Oral, Daily (standard)   budesonide-formoterol (SYMBICORT) 80-4.5 mcg/actuation inhaler 2 puffs, 2 times a day (standard)  Patient not taking: Reported on 08/07/2022   calcium carbonate (TUMS) 200 mg calcium (500 mg) chewable tablet 2 tablets, Oral, 2 times a day (standard)   cholecalciferol, vitamin D3 25 mcg, 1,000 units,, 1,000 unit (25 mcg) tablet 25 mcg, Oral, Daily (standard)   empty container Misc Use as directed to dispose of Humira   ferrous sulfate 325 (65 FE) MG tablet 325 mg, Oral, Every other day   fluticasone propionate (FLONASE) 50 mcg/actuation nasal spray 1 spray, Each Nare, Every morning   magnesium oxide (MAG-OX) 400 mg (241.3 mg elemental magnesium) tablet 400 mg, Oral, 2 times a day (standard)   nicotine (NICODERM CQ) 21 mg/24 hr patch 1 patch, Transdermal, Daily (standard)   pantoprazole (PROTONIX) 40 MG tablet 40 mg, Oral, Daily (standard)   QUEtiapine (SEROQUEL) 100 MG tablet Take 1 tablet (100 mg total) by mouth nightly.       Kennedy Environmental health practitioner:  Jerome Kennedy has decisional capacity for healthcare decision-making and is able to designate a surrogate healthcare decision maker. Jerome Kennedy healthcare decision maker(s) is/are Jerome Kennedy (the patient's adult sibling) as denoted by stated patient preference.    Objective:   Physical Exam:  Temp:  [36.3 ??C (97.3 ??F)] 36.3 ??C (97.3 ??F)  Heart Rate:  [71-98] 87  Resp:  [18] 18  BP: (92-114)/(71-81) 114/76  SpO2:  [94 %-98 %] 94 %    Gen: NAD, converses   Eyes: Sclera anicteric, EOMI grossly normal   HENT: Atraumatic, normocephalic  Neck: Trachea midline  Heart: RRR  Lungs: CTAB, no crackles or wheezes  Abdomen: Umbilical hernia, reducible; tenderness to palpation in RLQ  Extremities: No edema  Neuro: Grossly symmetric, non-focal    Skin:  No rashes, lesions on clothed exam  Psych: Alert, oriented

## 2022-09-29 NOTE — Unmapped (Signed)
Bed: 73-D  Expected date:   Expected time:   Means of arrival:   Comments:  17B Elnita Maxwell

## 2022-09-30 ENCOUNTER — Ambulatory Visit: Admit: 2022-09-30 | Payer: MEDICAID

## 2022-09-30 LAB — BASIC METABOLIC PANEL
ANION GAP: 5 mmol/L (ref 5–14)
BLOOD UREA NITROGEN: 9 mg/dL (ref 9–23)
BUN / CREAT RATIO: 9
CALCIUM: 7.7 mg/dL — ABNORMAL LOW (ref 8.7–10.4)
CHLORIDE: 114 mmol/L — ABNORMAL HIGH (ref 98–107)
CO2: 22 mmol/L (ref 20.0–31.0)
CREATININE: 0.98 mg/dL
EGFR CKD-EPI (2021) MALE: >90 mL/min/{1.73_m2} (ref >=60)
GLUCOSE RANDOM: 103 mg/dL (ref 70–179)
POTASSIUM: 4.2 mmol/L (ref 3.4–4.8)
SODIUM: 141 mmol/L (ref 135–145)

## 2022-09-30 LAB — CBC W/ AUTO DIFF
BASOPHILS ABSOLUTE COUNT: 0 10*9/L (ref 0.0–0.1)
BASOPHILS RELATIVE PERCENT: 0.9 %
EOSINOPHILS ABSOLUTE COUNT: 0.1 10*9/L (ref 0.0–0.5)
EOSINOPHILS RELATIVE PERCENT: 2.1 %
HEMATOCRIT: 37.2 % — ABNORMAL LOW (ref 39.0–48.0)
HEMOGLOBIN: 12.2 g/dL — ABNORMAL LOW (ref 12.9–16.5)
LYMPHOCYTES ABSOLUTE COUNT: 1.9 10*9/L (ref 1.1–3.6)
LYMPHOCYTES RELATIVE PERCENT: 38.3 %
MEAN CORPUSCULAR HEMOGLOBIN CONC: 32.8 g/dL (ref 32.0–36.0)
MEAN CORPUSCULAR HEMOGLOBIN: 30.6 pg (ref 25.9–32.4)
MEAN CORPUSCULAR VOLUME: 93.1 fL (ref 77.6–95.7)
MEAN PLATELET VOLUME: 8.3 fL (ref 6.8–10.7)
MONOCYTES ABSOLUTE COUNT: 0.5 10*9/L (ref 0.3–0.8)
MONOCYTES RELATIVE PERCENT: 10.9 %
NEUTROPHILS ABSOLUTE COUNT: 2.4 10*9/L (ref 1.8–7.8)
NEUTROPHILS RELATIVE PERCENT: 47.8 %
PLATELET COUNT: 260 10*9/L (ref 150–450)
RED BLOOD CELL COUNT: 4 10*12/L — ABNORMAL LOW (ref 4.26–5.60)
RED CELL DISTRIBUTION WIDTH: 16.9 % — ABNORMAL HIGH (ref 12.2–15.2)
WBC ADJUSTED: 5 10*9/L (ref 3.6–11.2)

## 2022-09-30 LAB — MAGNESIUM: MAGNESIUM: 1.7 mg/dL (ref 1.6–2.6)

## 2022-09-30 MED ORDER — PROMETHAZINE 12.5 MG TABLET
ORAL_TABLET | Freq: Four times a day (QID) | ORAL | 0 refills | 6 days | PRN
Start: 2022-09-30 — End: 2022-10-07

## 2022-09-30 MED ADMIN — oxyCODONE (ROXICODONE) immediate release tablet 10 mg: 10 mg | ORAL | @ 02:00:00 | Stop: 2022-10-13

## 2022-09-30 MED ADMIN — promethazine (PHENERGAN) tablet 12.5 mg: 12.5 mg | ORAL | @ 03:00:00

## 2022-09-30 MED ADMIN — promethazine (PHENERGAN) tablet 12.5 mg: 12.5 mg | ORAL | @ 10:00:00

## 2022-09-30 MED ADMIN — QUEtiapine (SEROquel) tablet 100 mg: 100 mg | ORAL | @ 02:00:00

## 2022-09-30 MED ADMIN — pantoprazole (Protonix) EC tablet 40 mg: 40 mg | ORAL | @ 14:00:00

## 2022-09-30 MED ADMIN — fluticasone furoate-vilanterol (BREO ELLIPTA) 100-25 mcg/dose inhaler 1 puff: 1 | RESPIRATORY_TRACT | @ 14:00:00

## 2022-09-30 MED ADMIN — oxyCODONE (ROXICODONE) immediate release tablet 10 mg: 10 mg | ORAL | @ 18:00:00 | Stop: 2022-10-13

## 2022-09-30 MED ADMIN — promethazine (PHENERGAN) tablet 12.5 mg: 12.5 mg | ORAL | @ 18:00:00

## 2022-09-30 MED ADMIN — oxyCODONE (ROXICODONE) immediate release tablet 10 mg: 10 mg | ORAL | @ 14:00:00 | Stop: 2022-10-13

## 2022-09-30 MED ADMIN — oxyCODONE (ROXICODONE) immediate release tablet 10 mg: 10 mg | ORAL | @ 08:00:00 | Stop: 2022-10-13

## 2022-09-30 MED ADMIN — enoxaparin (LOVENOX) syringe 40 mg: 40 mg | SUBCUTANEOUS | @ 14:00:00

## 2022-09-30 NOTE — Unmapped (Signed)
[facility]  [address] [place], [place] [address]    [phone]    This note is to let you know that Annamarie Dawley. did not show for their scheduled Occupational Therapy follow-up session for the second time today, however it is clear that both times he was admitted in the ER.  Please contact me if you have any questions or concerns.    Thank you for this referral,     Signed: Carie Caddy, OT  09/30/2022 9:42 AM

## 2022-09-30 NOTE — Unmapped (Signed)
Internal Medicine (MEDU) Progress Note    Assessment & Plan:   Jerome Kennedy. is a 47 y.o. male whose presentation is complicated by a history of complex ileocolonic Crohn's disease s/p prior ileocolonic resections currently treated with Humira  that presented to [facility] with 2-days of RLQ abdominal pain, nausea/vomiting, and left-hand swelling.     Principal Problem:    Nausea & vomiting  Active Problems:    Crohn's disease (CMS-HCC)    Abdominal pain  Resolved Problems:    * No resolved hospital problems. *        Active Problems    1) RLQ abdominal pain/nausea/vomiting - improving  Presented with 2 days acute RLQ abdominal pain/nausea/vomiting, with a CT A&P and concerning for a Crohn's flare. Vitals/Labs were notable for an afebrile course, elevated CRP/SedRate and normal WBC. GI was consulted and recommended colonoscopy to evaluate disease progression before possible medication changes. Patient initially declined colonoscopy. Today (09/30/22), patient was able to tolerate PO intake and reported improved appetite, well-formed stools yesterday, decreased pain, no vomiting. Given improving disease course and lack of fever/white count, there was little concern for infection, peritonitis, fistula, obstruction, and abscess.   - GI consulted on colonoscopy   - NG tube for bowel prep, per patient request   - GI Bowel prep  - Discontinue C. Diff, stool pathogen panel as patient having formed BM  - f/up Humira level and HLA-DQA1*05 genotype for Humira efficacy  - promethazine PRN for nausea     2) Left hand swelling - improving  He presented with significant left hand swelling with history of left hand surgery on 08/07/2022. X-ray of left hand was consistent with post-operative soft tissue swelling and no evidence for soft tissue gas or other overt signs of acute infection. Today (09/30/22), swelling had decreased significantly and hand was not red or warm. He denies pain or tenderness to palpation of the hand. Given improving course, swelling is most likely post-surgical.   - outpatient orthopedics follow-up scheduled for 10/08/22.    3) Hypocalcemia  Serum calcium ranging from 7.7-8.1.   - CTM    Chronic Problems    4) Ventral Hernia - stable  Reducible ventral hernia was noted and was not tender to palpation. Remains having BM and passing flatus. Will continue to monitor      5) Hypercoagulable state - stable  - enoxaparin syringe 40 mg Q24.     5) Bipolar Disorder - stable  - quetiapine 100 mg daily.       Issues Impacting Complexity of Management:  -Discussion of risks/benefits of deferring colonoscopy  with the patient/family including risk of risk factor: infection and medical complications.  -The patient is at high risk of complications from Crohn's disease      Daily Checklist:  Diet: Regular Diet  DVT PPx: Lovenox 40mg  q24h  Electrolytes: Replete Potassium to >/= 3.6 and Magnesium to >/= 1.8  Code Status: Full Code  Dispo: Goal Discharge: pending colonoscopy        Interval History:   No acute events overnight.    All other systems were reviewed and are negative except as noted in the HPI    Objective:   Temp:  [36.4 ??C (97.5 ??F)-37.1 ??C (98.8 ??F)] 36.7 ??C (98.1 ??F)  Heart Rate:  [77-89] 77  Resp:  [18-19] 18  BP: (87-103)/(59-80) 95/77  SpO2:  [98 %-100 %] 99 %    Gen: NAD, converses   HENT: atraumatic, normocephalic  Heart: RRR  Lungs: CTAB, no crackles or wheezes  Abdomen: normal bowel sounds, non-distended, no tenderness to light palpation, tender to deep palpation in the RLQ, no rebound/guarding  Extremities: No edema    Derald Macleod, MS3  [facility]     I attest that I have reviewed the medical student note and that the components of the history of the present illness, the physical exam, and the assessment and plan documented were performed by me or were performed in my presence by the student where I verified the documentation and performed (or re-performed) the exam and medical decision making. Daine Floras, MD

## 2022-09-30 NOTE — Unmapped (Signed)
Problem: Adult Inpatient Plan of Care  Goal: Plan of Care Review  Outcome: Progressing

## 2022-09-30 NOTE — Unmapped (Signed)
Problem: Adult Inpatient Plan of Care  Goal: Plan of Care Review  Outcome: Progressing  Goal: Patient-Specific Goal (Individualized)  Outcome: Progressing  Goal: Absence of Hospital-Acquired Illness or Injury  Outcome: Progressing  Goal: Optimal Comfort and Wellbeing  Outcome: Progressing  Goal: Readiness for Transition of Care  Outcome: Progressing  Goal: Rounds/Family Conference  Outcome: Progressing     Problem: Infection  Goal: Absence of Infection Signs and Symptoms  Outcome: Progressing     Problem: Wound  Goal: Optimal Coping  Outcome: Progressing  Goal: Optimal Functional Ability  Outcome: Progressing  Goal: Absence of Infection Signs and Symptoms  Outcome: Progressing  Goal: Improved Oral Intake  Outcome: Progressing  Goal: Optimal Pain Control and Function  Outcome: Progressing  Goal: Skin Health and Integrity  Outcome: Progressing  Goal: Optimal Wound Healing  Outcome: Progressing

## 2022-09-30 NOTE — Unmapped (Signed)
LWBS/AMA Patient Follow-Up Call  Chart reviewed after patient AMA. Patient has since been evaluated by [facility] on another visit, per chart documentation. No further actions needed.

## 2022-10-01 LAB — CBC W/ AUTO DIFF
BASOPHILS ABSOLUTE COUNT: 0 10*9/L (ref 0.0–0.1)
BASOPHILS RELATIVE PERCENT: 0.4 %
EOSINOPHILS ABSOLUTE COUNT: 0.1 10*9/L (ref 0.0–0.5)
EOSINOPHILS RELATIVE PERCENT: 1.3 %
HEMATOCRIT: 37.8 % — ABNORMAL LOW (ref 39.0–48.0)
HEMOGLOBIN: 12.2 g/dL — ABNORMAL LOW (ref 12.9–16.5)
LYMPHOCYTES ABSOLUTE COUNT: 1.9 10*9/L (ref 1.1–3.6)
LYMPHOCYTES RELATIVE PERCENT: 32.7 %
MEAN CORPUSCULAR HEMOGLOBIN CONC: 32.4 g/dL (ref 32.0–36.0)
MEAN CORPUSCULAR HEMOGLOBIN: 29.8 pg (ref 25.9–32.4)
MEAN CORPUSCULAR VOLUME: 91.9 fL (ref 77.6–95.7)
MEAN PLATELET VOLUME: 8.1 fL (ref 6.8–10.7)
MONOCYTES ABSOLUTE COUNT: 0.6 10*9/L (ref 0.3–0.8)
MONOCYTES RELATIVE PERCENT: 9.6 %
NEUTROPHILS ABSOLUTE COUNT: 3.3 10*9/L (ref 1.8–7.8)
NEUTROPHILS RELATIVE PERCENT: 56 %
PLATELET COUNT: 283 10*9/L (ref 150–450)
RED BLOOD CELL COUNT: 4.11 10*12/L — ABNORMAL LOW (ref 4.26–5.60)
RED CELL DISTRIBUTION WIDTH: 16.8 % — ABNORMAL HIGH (ref 12.2–15.2)
WBC ADJUSTED: 5.8 10*9/L (ref 3.6–11.2)

## 2022-10-01 LAB — BASIC METABOLIC PANEL
ANION GAP: 6 mmol/L (ref 5–14)
BLOOD UREA NITROGEN: 13 mg/dL (ref 9–23)
BUN / CREAT RATIO: 15
CALCIUM: 7.8 mg/dL — ABNORMAL LOW (ref 8.7–10.4)
CHLORIDE: 107 mmol/L (ref 98–107)
CO2: 24 mmol/L (ref 20.0–31.0)
CREATININE: 0.85 mg/dL
EGFR CKD-EPI (2021) MALE: >90 mL/min/{1.73_m2} (ref >=60)
GLUCOSE RANDOM: 80 mg/dL (ref 70–179)
POTASSIUM: 4.2 mmol/L (ref 3.4–4.8)
SODIUM: 137 mmol/L (ref 135–145)

## 2022-10-01 LAB — MAGNESIUM: MAGNESIUM: 1.7 mg/dL (ref 1.6–2.6)

## 2022-10-01 MED ORDER — PROMETHAZINE 12.5 MG TABLET
ORAL_TABLET | Freq: Four times a day (QID) | ORAL | 0 refills | 6 days | Status: CP | PRN
Start: 2022-10-01 — End: 2022-10-08
  Filled 2022-10-02: qty 21, 6d supply, fill #0

## 2022-10-01 MED ADMIN — Propofol (DIPRIVAN) injection: INTRAVENOUS | @ 21:00:00 | Stop: 2022-10-01

## 2022-10-01 MED ADMIN — phenylephrine 0.8 mg/10 mL (80 mcg/mL) injection: INTRAVENOUS | @ 21:00:00 | Stop: 2022-10-01

## 2022-10-01 MED ADMIN — lidocaine (XYLOCAINE) 20 mg/mL (2 %) injection: INTRAVENOUS | @ 21:00:00 | Stop: 2022-10-01

## 2022-10-01 MED ADMIN — promethazine (PHENERGAN) tablet 12.5 mg: 12.5 mg | ORAL | @ 01:00:00

## 2022-10-01 MED ADMIN — pantoprazole (Protonix) EC tablet 40 mg: 40 mg | ORAL | @ 14:00:00

## 2022-10-01 MED ADMIN — fluticasone furoate-vilanterol (BREO ELLIPTA) 100-25 mcg/dose inhaler 1 puff: 1 | RESPIRATORY_TRACT | @ 14:00:00

## 2022-10-01 MED ADMIN — promethazine (PHENERGAN) tablet 12.5 mg: 12.5 mg | ORAL | @ 19:00:00

## 2022-10-01 MED ADMIN — promethazine (PHENERGAN) tablet 12.5 mg: 12.5 mg | ORAL | @ 14:00:00

## 2022-10-01 MED ADMIN — oxyCODONE (ROXICODONE) immediate release tablet 10 mg: 10 mg | ORAL | @ 01:00:00 | Stop: 2022-10-13

## 2022-10-01 MED ADMIN — enoxaparin (LOVENOX) syringe 40 mg: 40 mg | SUBCUTANEOUS | @ 14:00:00

## 2022-10-01 MED ADMIN — promethazine (PHENERGAN) tablet 12.5 mg: 12.5 mg | ORAL | @ 08:00:00

## 2022-10-01 MED ADMIN — lactated Ringers infusion: 10 mL/h | INTRAVENOUS | @ 21:00:00

## 2022-10-01 MED ADMIN — oxyCODONE (ROXICODONE) immediate release tablet 10 mg: 10 mg | ORAL | @ 19:00:00 | Stop: 2022-10-13

## 2022-10-01 MED ADMIN — oxyCODONE (ROXICODONE) immediate release tablet 10 mg: 10 mg | ORAL | @ 14:00:00 | Stop: 2022-10-13

## 2022-10-01 MED ADMIN — oxyCODONE (ROXICODONE) immediate release tablet 10 mg: 10 mg | ORAL | @ 08:00:00 | Stop: 2022-10-13

## 2022-10-01 MED ADMIN — QUEtiapine (SEROquel) tablet 100 mg: 100 mg | ORAL | @ 01:00:00

## 2022-10-01 NOTE — Unmapped (Addendum)
Pt AOX4, pain & nausea addressed with PRN medications. Golytely was started through NGT tube last night. Pt has been tolerating well, but results not clear at this time. Approximately left to infuse.    1610 Pt completed Golytely and is almost clear.  Problem: Adult Inpatient Plan of Care  Goal: Plan of Care Review  Outcome: Progressing  Goal: Patient-Specific Goal (Individualized)  Outcome: Progressing  Goal: Absence of Hospital-Acquired Illness or Injury  Outcome: Progressing  Intervention: Identify and Manage Fall Risk  Recent Flowsheet Documentation  Taken 09/30/2022 2000 by Honor Junes, RN  Safety Interventions: environmental modification  Intervention: Prevent Skin Injury  Recent Flowsheet Documentation  Taken 09/30/2022 2000 by Honor Junes, RN  Positioning for Skin: Supine/Back  Goal: Optimal Comfort and Wellbeing  Outcome: Progressing  Goal: Readiness for Transition of Care  Outcome: Progressing  Goal: Rounds/Family Conference  Outcome: Progressing     Problem: Infection  Goal: Absence of Infection Signs and Symptoms  Outcome: Progressing     Problem: Nausea and Vomiting  Goal: Nausea and Vomiting Relief  Outcome: Progressing

## 2022-10-01 NOTE — Unmapped (Cosign Needed)
Gastroenterology (Luminal) Consult Service   Progress Note         Assessment and Recommendations:   Jerome Kennedy. is a 47 y.o. 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 The patient is seen in consultation at the request of Delanna Ahmadi, MD (Med General [facility] (MDU)) for nausea, vomiting, and diarrhea.    Assessment: 47 year old gentleman with a history of Crohn's disease who is currently on weekly Humira here for nausea and abdominal pain.  He was seen by the consult service in December and recommended for outpatient follow-up given he had been relatively stable and preferred an outpatient workup.  Underwent both EGD and colonoscopy today which showed EGD was normal and showed no abnormalities to explain his nausea and abdominal pain.  Colonoscopy was limited.  Procedure had to be aborted due to extensive stool within the colon but we were able to traverse to descending colon.  Extensive irrigation and suction was performed and visualized tissue showed no evidence of active disease.  Simple endoscopic scoring of Crohn's disease- 0.  We recommend following up with primary gastroenterologist (he already has this set up outpatient) for ongoing management.  Continue current dose of Humira.    Recommendations  -Continue current dose of Humira outpatient  - No additional procedures planned from GI standpoint  - Do not recommend starting systemic steroids as no signs of active disease based on EGD and colonoscopy (though partially limited due to poor prep)    Issues Impacting Complexity of Management:  -None    Recommendations discussed with the patient's primary team. We will sign-off at this time, please re-contact if additional questions or a new need for consultation arises.    For questions, contact the on-call fellow for the Gastroenterology (Luminal) Consult Service.    Subjective:   No overnight events. Feeling well today. Ready for endoscopy.     -I have reviewed the patient's prior records from 04/2022-07/2022 as summarized in the HPI    Objective:   Temp:  [36.8 ??C-37.3 ??C] 36.8 ??C  Heart Rate:  [83-100] 83  Resp:  [14-16] 14  BP: (98-107)/(74-78) 98/74  SpO2:  [94 %-100 %] 98 %    Gen: WDWN male in NAD, answers questions appropriately.  Cardiovascular: Tachycardic to 120s after position change, reduced to 80s within one minute of sitting. Regular rhythm and no murmur.  Abdomen: Prominent ventral hernia, reducible and nontender. Exquisitely TTP in RLQ, mildly TTP in RUQ, nontender in all other quadrants. Soft, no rebound tenderness, no palpable hepatosplenomegaly  Extremities: No edema in the BLEs    Pertinent Labs/Studies:  -I have visualized the patient's CT A/P dated 2/19 which shows neoterminal ileum narrowing and dilation, relatively unchanged from studies in 07/2022 and 04/2022, unclear if evidence of chronic sequelae of IBD vs. acute-on-chronic process.  -I have reviewed the patient's labs from 2/25/-2/26 which show somewhat downtrending Hgb (12.7 -> 11.1),  WBC (4.3 -> 3.6), BUN/creatinine (11/0.91 -> <5/0.46), and uptrending Na and Cl (143 and 113 -> 147 and 129) most consistent with dilution from IVF.  CRP elevated to 29 (from 13 on 2/19), total protein low (5.5 -> 2.4), lactate WNL (1.4).

## 2022-10-01 NOTE — Unmapped (Shared)
Internal Medicine (MEDU) Progress Note    Assessment & Plan:   Jerome Kennedy. is a 47 y.o. 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     Principal Problem:    Nausea & vomiting  Active Problems:    Crohn's disease (CMS-HCC)    Abdominal pain  Resolved Problems:    * No resolved hospital problems. *        Active Problems    1) RLQ abdominal pain/nausea/vomiting - improving  Presented with 2 days acute RLQ abdominal pain/nausea/vomiting, with a CT A&P and concerning for a Crohn's flare. Vitals/Labs were notable for an afebrile course, elevated CRP/SedRate and normal WBC. GI was consulted and recommended colonoscopy to evaluate disease progression before possible medication changes.     Patient completed bowel prep last night (2/27) via NG tube and was made NPO at midnight. Overnight, patient reported stools (3X) which were clear, watery, and orange-colored.     Today (10/01/22), patient  reported improved appetite, no abd pain, no vomiting.   - GI consulted on colonoscopy ***   - NG tube for bowel prep, per patient request   - GI Bowel prep completed (2/27)   - Colonoscopy completed (2/28) and showed ***  - Discontinue C. Diff, stool pathogen panel as patient having formed BM  - f/up Humira level and HLA-DQA1*05 genotype for Humira efficacy  - promethazine PRN for nausea     2) Left hand swelling - improving  He presented with significant left hand swelling with history of left hand surgery on 08/07/2022. X-ray of left hand was consistent with post-operative soft tissue swelling and no evidence for soft tissue gas or other overt signs of acute infection. Today (09/30/22), swelling had decreased significantly and hand was not red or warm. He denies pain or tenderness to palpation of the hand. Given improving course, swelling is most likely post-surgical.   - outpatient orthopedics follow-up scheduled for 10/08/22.    3) Hypocalcemia  Serum calcium ranging from 7.7-8.1.   - CTM    Chronic Problems    4) Ventral Hernia - stable  Reducible ventral hernia was noted and was not tender to palpation. Remains having BM and passing flatus. Will continue to monitor      5) Hypercoagulable state - stable  - enoxaparin syringe 40 mg Q24.     5) Bipolar Disorder - stable  - quetiapine 100 mg daily.       Issues Impacting Complexity of Management:  -Discussion of risks/benefits of deferring colonoscopy  with the patient/family including risk of risk factor: infection and medical complications.  -The patient is at high risk of complications from Crohn's disease      Daily Checklist:  Diet: Regular Diet  DVT PPx: Lovenox 40mg  q24h  Electrolytes: Replete Potassium to >/= 3.6 and Magnesium to >/= 1.8  Code Status: Full Code  Dispo: Goal Discharge: pending colonoscopy        Interval History:   No acute events overnight.    All other systems were reviewed and are negative except as noted in the HPI    Objective:   Temp:  [37.1 ??C (98.8 ??F)-37.3 ??C (99.1 ??F)] 37.1 ??C (98.8 ??F)  Heart Rate:  [92-100] 92  Resp:  [16] 16  BP: (102-107)/(74-78) 106/74  SpO2:  [94 %-100 %] 94 %  Gen: NAD, converses   HENT: atraumatic, normocephalic  Heart: RRR  Lungs: CTAB, no crackles or wheezes  Abdomen: normal bowel sounds, non-distended, no tenderness to light palpation, tender to deep palpation in the RLQ, no rebound/guarding  Extremities: No edema    Derald Macleod, MS3  [facility]     I attest that I have reviewed the medical student note and that the components of the history of the present illness, the physical exam, and the assessment and plan documented were performed by me or were performed in my presence by the student where I verified the documentation and performed (or re-performed) the exam and medical decision making. Daine Floras, MD

## 2022-10-01 NOTE — Unmapped (Signed)
Care period 0700-1900  Neuro: Alert, oriented, able to answer all questions appropriately and make needs known.  Cardiac/PV: No telemetry ordered. No cardiac s/sx noted or reported. Ordered parameters maintained: pulse >50 & <120, SBP >85 & <200, and DBP <120. Normal and symmetric distal pulses. Brisk capillary refill. Normal skin turgor.  Respiratory: Sats maintained on room air. No cough. Lungs clear, diminished. Normal respiratory effort.  GI: Abdomen rounded, firm. Large abdominal/umbilical hernia noted, reducible; patient reports it is unchanged from normal. Bowel sounds present x 4 quadrants to auscultation. Good appetite; regular diet. Clear liquid diet tonight then NPO at midnight. Last BM 2/26. NGT inserted right nare per patient request for bowel prep administration. KUB ordered, awaiting order for ok to use.   GU: Voids without difficulty.   Integumentary:  Skin warm & dry. Frequent repositioning encouraged to maintain skin integrity.  MS:  Gait shuffling but steady without assistance needed. Fall precautions maintained. Normal range of motion in all extremities. Up ad lib.  Lines: PIV intact without s/sx infiltration or infection. Flushed without difficulty and Curos-capped.  Other: Remains observation status for Crohn's flare. PRN pain regimen administered for unchanged abdominal pain, denied need for additional pain relief. Patient did not have a BM today, last BM was reported as last night and it was formed. MD aware--c-diff assay and GI path panel d/c'd. Enteric isolation discontinued. PO fluids and call light within reach. POC discussed with patient throughout shift; see MAR and flowsheets for details. Patient planned for colonoscopy tomorrow.      Problem: Wound  Goal: Optimal Coping  Outcome: Resolved  Goal: Optimal Functional Ability  Outcome: Resolved  Goal: Absence of Infection Signs and Symptoms  Outcome: Resolved  Goal: Improved Oral Intake  Outcome: Resolved  Goal: Optimal Pain Control and Function  Outcome: Resolved  Goal: Skin Health and Integrity  Outcome: Resolved  Intervention: Optimize Skin Protection  Recent Flowsheet Documentation  Taken 09/30/2022 0830 by Asencion Gowda, RN  Pressure Reduction Techniques: frequent weight shift encouraged  Pressure Reduction Devices: pressure-redistributing mattress utilized  Taken 09/30/2022 0800 by Asencion Gowda, RN  Pressure Reduction Techniques: frequent weight shift encouraged  Pressure Reduction Devices: pressure-redistributing mattress utilized  Goal: Optimal Wound Healing  Outcome: Resolved     Problem: Adult Inpatient Plan of Care  Goal: Plan of Care Review  09/30/2022 1851 by Asencion Gowda, RN  Outcome: Progressing  09/30/2022 1851 by Asencion Gowda, RN  Outcome: Progressing  Goal: Patient-Specific Goal (Individualized)  09/30/2022 1851 by Asencion Gowda, RN  Outcome: Progressing  09/30/2022 1851 by Asencion Gowda, RN  Outcome: Progressing  Goal: Absence of Hospital-Acquired Illness or Injury  09/30/2022 1851 by Asencion Gowda, RN  Outcome: Progressing  09/30/2022 1851 by Asencion Gowda, RN  Outcome: Progressing  Intervention: Identify and Manage Fall Risk  Recent Flowsheet Documentation  Taken 09/30/2022 0800 by Asencion Gowda, RN  Safety Interventions:   environmental modification   fall reduction program maintained   lighting adjusted for tasks/safety   low bed   nonskid shoes/slippers when out of bed  Intervention: Prevent Skin Injury  Recent Flowsheet Documentation  Taken 09/30/2022 1000 by Asencion Gowda, RN  Positioning for Skin: Standing  Taken 09/30/2022 0830 by Asencion Gowda, RN  Positioning for Skin: Supine/Back  Taken 09/30/2022 0800 by Asencion Gowda, RN  Positioning for Skin: Supine/Back  Goal: Optimal Comfort and Wellbeing  09/30/2022 1851 by Asencion Gowda, RN  Outcome: Progressing  09/30/2022 1851 by Asencion Gowda, RN  Outcome: Progressing  Goal: Readiness for Transition of Care  09/30/2022 1851 by Asencion Gowda, RN  Outcome: Progressing  09/30/2022 1851 by Asencion Gowda, RN  Outcome: Progressing  Goal: Rounds/Family Conference  09/30/2022 1851 by Asencion Gowda, RN  Outcome: Progressing  09/30/2022 1851 by Asencion Gowda, RN  Outcome: Progressing     Problem: Infection  Goal: Absence of Infection Signs and Symptoms  09/30/2022 1851 by Asencion Gowda, RN  Outcome: Progressing  09/30/2022 1851 by Asencion Gowda, RN  Outcome: Progressing     Problem: Nausea and Vomiting  Goal: Nausea and Vomiting Relief  Outcome: Progressing

## 2022-10-02 LAB — CBC W/ AUTO DIFF
BASOPHILS ABSOLUTE COUNT: 0 10*9/L (ref 0.0–0.1)
BASOPHILS RELATIVE PERCENT: 0.9 %
EOSINOPHILS ABSOLUTE COUNT: 0.1 10*9/L (ref 0.0–0.5)
EOSINOPHILS RELATIVE PERCENT: 2.4 %
HEMATOCRIT: 36.2 % — ABNORMAL LOW (ref 39.0–48.0)
HEMOGLOBIN: 11.8 g/dL — ABNORMAL LOW (ref 12.9–16.5)
LYMPHOCYTES ABSOLUTE COUNT: 1.4 10*9/L (ref 1.1–3.6)
LYMPHOCYTES RELATIVE PERCENT: 34.6 %
MEAN CORPUSCULAR HEMOGLOBIN CONC: 32.7 g/dL (ref 32.0–36.0)
MEAN CORPUSCULAR HEMOGLOBIN: 29.7 pg (ref 25.9–32.4)
MEAN CORPUSCULAR VOLUME: 90.8 fL (ref 77.6–95.7)
MEAN PLATELET VOLUME: 7.9 fL (ref 6.8–10.7)
MONOCYTES ABSOLUTE COUNT: 0.5 10*9/L (ref 0.3–0.8)
MONOCYTES RELATIVE PERCENT: 13.1 %
NEUTROPHILS ABSOLUTE COUNT: 2 10*9/L (ref 1.8–7.8)
NEUTROPHILS RELATIVE PERCENT: 49 %
PLATELET COUNT: 255 10*9/L (ref 150–450)
RED BLOOD CELL COUNT: 3.99 10*12/L — ABNORMAL LOW (ref 4.26–5.60)
RED CELL DISTRIBUTION WIDTH: 16.6 % — ABNORMAL HIGH (ref 12.2–15.2)
WBC ADJUSTED: 4 10*9/L (ref 3.6–11.2)

## 2022-10-02 LAB — BASIC METABOLIC PANEL
ANION GAP: 7 mmol/L (ref 5–14)
BLOOD UREA NITROGEN: 11 mg/dL (ref 9–23)
BUN / CREAT RATIO: 13
CALCIUM: 8.3 mg/dL — ABNORMAL LOW (ref 8.7–10.4)
CHLORIDE: 108 mmol/L — ABNORMAL HIGH (ref 98–107)
CO2: 25 mmol/L (ref 20.0–31.0)
CREATININE: 0.88 mg/dL
EGFR CKD-EPI (2021) MALE: >90 mL/min/{1.73_m2} (ref >=60)
GLUCOSE RANDOM: 83 mg/dL (ref 70–179)
POTASSIUM: 4.3 mmol/L (ref 3.4–4.8)
SODIUM: 140 mmol/L (ref 135–145)

## 2022-10-02 LAB — MAGNESIUM: MAGNESIUM: 1.7 mg/dL (ref 1.6–2.6)

## 2022-10-02 MED ORDER — OXYCODONE 5 MG TABLET
ORAL_TABLET | ORAL | 0 refills | 2 days | Status: CP | PRN
Start: 2022-10-02 — End: 2022-10-05
  Filled 2022-10-02: qty 10, 2d supply, fill #0

## 2022-10-02 MED ADMIN — pantoprazole (Protonix) EC tablet 40 mg: 40 mg | ORAL | @ 14:00:00 | Stop: 2022-10-02

## 2022-10-02 MED ADMIN — oxyCODONE (ROXICODONE) immediate release tablet 10 mg: 10 mg | ORAL | @ 10:00:00 | Stop: 2022-10-02

## 2022-10-02 MED ADMIN — oxyCODONE (ROXICODONE) immediate release tablet 10 mg: 10 mg | ORAL | @ 01:00:00 | Stop: 2022-10-13

## 2022-10-02 MED ADMIN — fluticasone furoate-vilanterol (BREO ELLIPTA) 100-25 mcg/dose inhaler 1 puff: 1 | RESPIRATORY_TRACT | @ 13:00:00 | Stop: 2022-10-02

## 2022-10-02 MED ADMIN — oxyCODONE (ROXICODONE) immediate release tablet 10 mg: 10 mg | ORAL | @ 06:00:00 | Stop: 2022-10-02

## 2022-10-02 MED ADMIN — QUEtiapine (SEROquel) tablet 100 mg: 100 mg | ORAL | @ 01:00:00

## 2022-10-02 MED ADMIN — promethazine (PHENERGAN) tablet 12.5 mg: 12.5 mg | ORAL | @ 10:00:00 | Stop: 2022-10-02

## 2022-10-02 MED ADMIN — promethazine (PHENERGAN) tablet 12.5 mg: 12.5 mg | ORAL | @ 01:00:00

## 2022-10-02 NOTE — Unmapped (Signed)
Financial/Resource/Food/Housing Assistance :  [facility] Department of Social Services:  Sales executive, Transport planner, Medicaid and many other services:  Please visit or call any [facility] DSS location (below) to learn more and apply. You can also apply online ([email or web address]/) for Medicaid or Food Stamps services.  Emergency Financial Assistance for assistance with utilities, water, heating etc can be applied for when funds are available.  [facility]   [address]  [place], [place] [address] [address]  [place], [place] [address]   [phone]     Food Stamps: Visit one of the DSS offices above OR apply online ([email or web address]   Medicaid: Visit one of the DSS offices above OR apply online ([email or web address]   Emergency Financial Assistance:   Families who are experiencing a financial emergency may be eligible for financial help to pay for utilities and/or medicine. There is a limit in [facility] of $200 per twelve-month period. People needing assistance more than once a year are required to attend financial counseling.  Applications are taken at either building of the [facility] and are processed within 5 workdays.Bringing the following items will aid in the process of applications:  Cut off notice(s), eviction order, etc.  Identification  Low-Income Energy/Water Assistance: Please visit or call any [facility] DSS location to learn more about energy programs.  The Emergency Food Assistance Program [facility]): The Emergency Food Assistance Program [facility]) is a supplemental food assistance program offered through [facility]. Monthly food distribution events are scheduled every fourth Wednesday. Eligible recipients must receive FNS benefits (also known as food stamps) or meet income requirements. Appointments are required to receive food during TEFAP distribution events. To reserve an appointment, recipients must call [phone] on the day prior to a TEFAP food distribution event. Recipients can choose to pick up their reserved food from either the [facility] ([address]) or [employer] [phone]) location. For more information about the TEFAP program, call [phone].    Medicaid Transportation  Medicaid offers transportation to/from Medical appointments for Medicaid recipients. The contact depends on what kind of Medicaid you receive:  [place] Medicaid / Medicaid [place] Access: Contact your local Department of Social Services to register [facility] = [phone])  AmeriHealth Caritas  [phone]  [facility] Complete  [phone]  Healthy Blue  [phone]  [facility] Plan  [phone]  [facility]  [phone]  [facility]  [phone]  [facility]  [phone]  [facility]ners [facility] Management  [phone]  [facility]  [phone]  [facility] Health Resources  [phone]  [facility]  [phone]       [place] 211: call 211 or visit https://barton-williams.info/ to get referrals to agencies in your area who can assist with housing, food, utilities, finances, shelter, and many other resources.     Community Empowerment Fund:  Main Website: http://www.pope.info/  Services: http://castillo-powell.com/  Financial Coaching, Set designer, Airline pilot. Connection to local resources for assistance.  [employer] Office: [address], [place], [place] [address]  [phone]  ch@communityempowermentfund .[facility]   [address] [place], [place] [address]   Phone: [phone]?P442919   www.[email or web address]   Email:  info@binkleychurch .org   Help with gas and food gift cards only. Call Thursday AM to see if funds are available. Open Friday mornings only.    [facility]  [address]  [place], [place] [address]   [phone]  Provides a food pantry. Pantry Hours: Monday - Friday 9:00am - 5:00pm  Also offers financial assistance with DSS referral.    [facility]  [address] [place] - [address]   [phone]   Provides a food pantry. Pantry Hours: 3rd Friday of the month 12:00pm - 2:00pm    [employer] Guide  The [employer] local services and resources for older adults and their families.  Download a hard copy of the [employer], updated as new resources are added: CelebMarketing.co.nz     St. [facility]ie F[facility] More   Help with rent and utility payments only. Open Monday mornings only. Call for times. COUNTIES SERVED: [place]   [address] [place], [place] [address]   Phone: [phone]   www.stmchapelhill.org    [employer] Engineer, site)  The [employer] is open at [employer], [address] in [place] on the [employer] by appointment Monday-Friday. Please call [phone] and press0 to schedule a pick-up time.  The [employer] makes available fresh produce, pantry staples, and hygiene items at no cost to households where someone lives or works in [place] or [place]. We also provide holiday meals in November and December.     [facility] [employer]:  Phone: [phone]   https://www.catholiccharitiesraleigh.org/dcfp-programs/  Resources: Museum/gallery curator, Corporate investment banker, Case Management, Emergency Utility Assistance.  - [employer] offers emergency financial assistance for utility bills such as electric, gas, or water. Funds are limited and assistance is administered on a first come first served basis. If you need assistance, please call 6802286355 or email [email or web address] [email or web address]       Street Outreach, Harm Reduction and Deflection [facility]) Team:  Connections for people living unsheltered  The [employer], Harm Reduction and Deflection program connects people experiencing homelessness in [place] with housing and services. Peer support and clinical staff use a relationship-based model to provide ongoing engagement and case management for people living unsheltered.  Availability and Contact Information  Monday-Friday          8am - 9pm  Saturday                     12pm - 9pm  SOHRAD Program Phone:  [phone]    [facility] Housing Help:  Help Is Available for People in Housing Crisis  The [facility] Housing Help program connects people in housing crisis with housing resources in [place]. May offer financial assistance when available to avoid eviction or to help with costs.  Availability and Contact Information:  [facility] [phone] or housinghelp@[place] .gov  Monday - Friday          10am-4pm  Or leave a voicemail/send an email any time  In-person drop-in hours:  Mondays, 9am - 1pm; [facility], [address], [place]  Tuesdays and Thursdays, 8:30am - 4pm; [employer], [address], [place]    [facility] Department  Assistance with section 8 housing vouchers, referrals for affordable housing options, etc.    Housing Offices     [facility]          [address]  [place], [place] [address]  Phone: [phone]  Fax: [phone]  Monday-Friday, 8am-5pm  [place]       [address] 3rd Floor  [place], [place] [address]  Phone: [phone]  Fax: [phone]  Monday-Friday, 8am-5pm    Meals on Wheels  [place]  APPLY TO GET MEALS ON WHEELS:   To apply or refer a recipient, complete and submit the short form by clicking on the Apply for Meals link below. The potential recipient will be contacted promptly. For more information, please email [place] at [email or web address] or call [phone].  As of January 2020, Meals on [facility] has a wait list for service. We encourage potential recipients or those who refer potential recipients to still apply for meals.     Apply for Meals: [email or web address]       [facility] Program  We will offer dine-in and pick up hot meals Monday - Friday at 12 noon. Registration is required to participate in the lunch program. Email Lottie Dawson at [email or web address] or call [phone] for more information or to register for our Lunch Program.  At this time we are only accepting new applications for those county residents aged 47 and over who can pick up a meal at either the [facility] or [facility].   Individuals and those in group quarters will only be considered if they can identify someone to pick up their meal. Staff and volunteers are able to assist with delivery to individual homes for those who rely on public transportation.   Please contact our Electrical engineer for any questions or concerns at:  [employer] Coordinator: Lottie Dawson or phone at [phone]  [facility] Center: [phone]  [facility]: [phone]      For entry to shelter:  [facility] Coordinated Entry hotline: ([phone]  [facility] Coordinated Entry hotline: (986)511-5378  [facility]: Present for in-person assessment at one of the following two locations:  [facility]  [address] [place], [place] [address]  Phone: [phone]  HOURS:  9am-5pm Monday-Friday  9am-6pm on Saturday and Sunday for weekend meals only (enter at the side entrance)    [facility] (ages 47-24)  [address] [place], [place] [address]  ONLY FOR THOSE AGED 18-24.  Please call [phone] to schedule an intake for Access Hub Services if you are aged 18-24 and are in need of  shelter/services.  [place] [employer] Cot Program  [employer] and Home Start [place] Weather Cots [facility]) will be accepting same-day emergency access on Tuesday, November 1st. Individuals seeking cold weather shelter may access a spot by calling IFC's main number at [phone].   Press x4000 for HomeStart shelter for women and families.  Press x3000 for [employer] for men.      Senior [employer] on [employer]  APPLY TO GET MEALS ON WHEELS:   To apply or refer a recipient, complete and submit the short form by clicking on the Apply for Meals link below. The potential recipient will be contacted promptly. For more information, please email [place] at [email or web address] or call [phone].  As of January 2020, Meals on [facility] has a wait list for service. We encourage potential recipients or those who refer potential recipients to still apply for meals.     Apply for Meals: [email or web address]       [facility] Program  We will offer dine-in and pick up hot meals Monday - Friday at 12 noon. Registration is required to participate in the lunch program. Email Lottie Dawson at [email or web address] or call [phone] for more information or to register  for our Lunch Program.  At this time we are only accepting new applications for those county residents aged 110 and over who can pick up a meal at either the [facility] or [facility].   Individuals and those in group quarters will only be considered if they can identify someone to pick up their meal. Staff and volunteers are able to assist with delivery to individual homes for those who rely on public transportation.   Please contact our Electrical engineer for any questions or concerns at:  [employer] Coordinator: Lottie Dawson or phone at [phone]  [facility]: [phone]  [facility]: [phone]    [facility]  [address]  [place], [place] [address]  Phone: [phone]   [facility]  [address]  [place], [place] [address]  Phone: [phone]     Resource referrals  DME rentals program  Support Groups  Activities for seniors  Caregiver support  Aging in Place resources  Aging Helpline (resources, referrals, etc): MfgBlog.cz   English Helpline: Call [phone]  Mandarin Chinese Help-Line: ????: 704-014-2149  Spanish Help-Line: L??nea telef??nica de ayuda: 518-841-6606  Application assistance - assistance applying for Medicaid, Medicare, financial assistance, etc.  Financial Assistance  Incontinence supplies  Lunch program  Activity Guide: https://dixon.info/       Children's Resources:    [facility] Department - [facility], Case Management, etc.  WIC: WIC is for pregnant women, breast feeding women who have had a baby in the last 12 months, women who have had a baby in the last 6 months, and infants children up to 5 years. WIC provides: Healthy food, Infant cereal, Infant formula, breastfeeding support, etc.  To apply call the [facility] Department at [phone] or [phone]. You can also call [phone]. Ask what you need to bring to your visit when you make your appointment.   Care Management programs: For pregnant women, postpartum women, infants, and children 0-5 who are Medicaid eligible. Care Managers (nurses and social workers) offer free services such as care management, connection to resources/referrals, connection to care, education, support, etc. Call [phone] for more information.    CDSA (Children's Developmental Services Agency): [employer] for children with disabilities and developmental delays.  Contact: [email or web address] .https://hunt-bailey.com/  ([phone]      TEACCH: Services for children and adults with Autism Spectrum Disorders  The [facility] serves individuals with Autism Spectrum Disorders. Services include diagnostic evaluations, treatment planning and implementation, education, consultation, employment services, training opportunities, and research.  Address: [address] [place], [place], [place] [address]  Phone: [phone]    [facility] for [place] Children  [phone]   Leone Payor Imagination Library, Referrals to Childcare, [employer] General, referral program, parenting programs.    [facility] Department of Social Services  Child Care Voucher Program, Child Support, [place], Media planner, Sales executive, etc.  [facility]   [address]  [place], [place] [address] [address]  [place], [place] [address]   [phone]     [facility] Start  [facility] Main Office  [address]  [place], [place] [address]  Phone: [phone]     Transportation    Many insurance providers have medical transportation assistance. Call your insurance to inquire.    Medicaid Transportation  Medicaid offers transportation to/from Medical appointments for Medicaid recipients. The contact depends  on what kind of Medicaid you receive:  [place] Medicaid / Medicaid [place] Access: Facilities manager Department of Social Services to register [place] = [phone])  [facility]iHealth Caritas  [phone]  [facility] Complete  [phone]  Healthy Blue  [phone]  [facility] Plan  [phone]  [facility]  [phone]  [facility]  [phone]  [facility]  [phone]  [facility] Management  [phone]  [facility]  [phone]  [facility] Resources  [phone]  [facility]  [phone]     [facility] Transportation  [phone]  Offers fixed routes, on-demand services, and ADA transportation. Individuals who qualify can apply for free ADA transportation. Call [phone] for more information.  Routes:  [place] Circulator   7:00 a.m. - 5:00 p.m. Monday - Friday  Click here for route map  [facility]   8:30 a.m. - 4::30 p.m. Monday - Friday  Click here for route map  [facility]   10:00 a.m. - 3:00 p.m. Monday - Friday  Click here for route map  Demand Response Services   8:00 a.m. - 4:30p.m Monday - Friday  Mobility on Demand (MOD)  OCPT's MOD comes directly to you and takes you where you want to go. Book your trip on the TransLoc app or website  OCPT's on-demand travel option provides service in the [place], [place] area, including the [place]-and-Ride in [place]. MOD service is available Mon-Thurs 8 am- 5 pm, Fridays 8 am-9 pm, and Saturdays from 9 a.m. to 5 p.m.  M.O.D. stands for Mobility On Demand, meaning the ride comes when you request it. Similar to a taxi or services provided by Uber/Lyft, when MOD is in service you???ll be able to request a ride within the service area by using the TransLoc app, by calling [facility] at [phone], or by visiting [email or web address]  How much does it cost to ride MOD?  MOD will cost $5 per zone traveled through.   Questions? Call [phone]  How to Request MOD Through the TransLoc App  Download the TransLoc app to your mobile device  Open the app and go to OnDemand to see services in your area  The app will show you services based on your current location, use the arrows towards the bottom of your screen to find MOD (Mobility On-Demand)   Touch the words MOD (Mobility On-Demand) to begin entering your trip details  Enter your pickup location in the first box at the top. The app will use your current location as the default pickup. Touch the address if you need to select another and you'll get the option to enter another address.  Enter your destination   Set the time of your pickup as ASAP if you're ready for a ride or touch the pencil to select another pickup time  Press the Request Ride button to send your ride request  Confirm the pickup location  If you haven't logged in previously, you'll need to log in now or create an account   After your ride request is confirmed you'll receive notifications about your driver's location and estimated time of arrival  Don't forget to put your mask/face covering on before boarding the MOD Zenaida Niece  Learn more about the TransLoc mobile app in this brochure.  Return trips for Fridays must be booked by 8:30pm  Return trips for Saturdays must be booked by 4:30pm    How to Request a Ride Through [employer]  Visit [email or web address] click on Sign up to set  up an account if needed  You will need to provide your first and last name, your phone number, and a password when setting up your account  Select [facility] [employer] as your agency, then Book a Ride   If you want to get picked up as soon as possible, select Next Available. If you want MOD to arrive at a specific time or a future date, select Specify time  Enter your pick up and drop off address and adjust the marker on the map as appropriate  Enter the number of passengers that will be riding (1 if it's just you) and let us know if you require wheelchair accessibility  Click Schedule Ride to complete your request  Are MOD vans accessible?  The MOD vans can accommodate 1 passenger with a large wheelchair, plus a companion.    Why does MOD run only run two days a week?   We are launching MOD to meet our riders' requests for service that runs later than 5 p.m. and on the weekends. OCPT currently operates fixed routes (buses that run on a fixed schedule and route) and demand-response service Monday through Friday from 8 a.m. to 5 p.m., with limited transit service on Saturdays and on holidays to [facility] only.  How many people can a MOD van accommodate?  Our MOD vehicles are [employer]. They seat up to three passengers with one in front, two in back. The MOD Zenaida Niece can accommodate one passenger with large wheelchair plus a travel companion.  I have a question that's not answered in the FAQs, who should I talk to?  Contact EmmaLea Ange, at [email or web address] or [phone].        EZ Rider - [place]  About EZ Rider Service  EZ Rider ([employer]) is a Psychologist, prison and probation services, shared ride service providing origin-to-destination (generally means door to door) service on an advance reservation basis. EZ Nolon Bussing transports individuals who are unable to use the fixed route system (some or all of the time) due to a disability in accordance with the Americans with Disabilities Act (ADA). EZ Nolon Bussing uses lift-equipped vehicles serving the [place] of [place], the [place] of [place] and surrounding areas within ?? of a mile of the fixed-route bus system.   EZ Nolon Bussing may be able to provide temporary service to customers who are not able to use the bus system due to a change in physical ability or bus service. To see if you qualify for temporary access to EZ-Rider, call: [phone].  EZ Rider Hours of Operation:  Monday - Friday, 5:30 AM -11:00 PM  Saturday, 8:00 AM - 11:00 PM  Sunday, 8:00 AM - 11:00 PM  Reservations:  Monday - Saturday, 8:30 AM - 5:00 PM.  Sunday, 1:00 PM - 5:00 PM  Reservations can be reached at [phone]  How to Apply:  All EZ Rider customers must be certified to use the service. Eligibility is determined on a case-by-case basis. According to ADA regulations, eligibility is strictly limited to those who have a permanent or temporary physical or mental impairment that substantially limits one or more major life activities and prevents them from using fixed route services.  Eligible customers must complete an application. The application must be completed by you and your medical provider. You can obtain an application by calling EZ Rider at [phone] or click the following link EZ Rider Certification/Application Form. ([email or web address].is)      Primary Care  Student Health Action [facility]): free student-run  health clinic for those who are uninsured or underinsured. Wednesday Nights 6pm-10pm at the [facility] at [address], [place], [place] [address]  Main website: [email or web address]  Phone # to call for appts: [phone], or fill out this request form  Primary Care, Dental Care, Transitional Long-Term Care, Healthcare Insurance Assistance, Mental Health Services, Physical Therapy, Nutrition Education, Gender-Affirming Care, Eye Care, GYN Wellness, and many other services.  Mental Health Services Information: [email or web address]  Primary Care Services Information: [email or web address].es  Bridge to Care Information: [email or web address].cy  [facility]: Medical clinic offering care to anyone including those w/out insurance, can offer sliding-scale fees.  Website: LocalBorders.pl  Phone: [phone]  Offers primary care, pharmacy, dental, behavioral health, etc.  Has two locations in [facility]:   [facility]  Address: [address], [place], [place] [address]   Phone: [phone]  Medical services: Primary Care, Women's Health, Prenatal Care and Family Planning, Physical Exams (school, employment, sports, nursing home, etc.), Chronic Illness, Minor Trauma, Immunizations, Flu Shots  Dental  Diabetes Classes  WIC  Pharmacy Services: Radio broadcast assistant, Medication Counseling, Low-Cost Prescriptions  Behavioral Health: Care Management, Social Work, Crisis Intervention, Garment/textile technologist Referrals, Short-Term Counseling, Substance Abuse Referrals  [facility]  Address: [address], [place], [place] [address]   Phone: [phone]  Medical services: Primary Care, Women's Health, Prenatal Care and Family Planning, Physical Exams (school, employment, sports, nursing home, etc.), Chronic Illness, Minor Trauma, Immunizations, Flu Shots, Nutrition Counseling  Pharmacy Services: Radio broadcast assistant, Medication Counseling, Low-Cost Prescriptions  Behavioral Health: Care Management, Social Work, Crisis Intervention, Garment/textile technologist Referrals, Short-Term Counseling, Substance Abuse Referrals        Behavioral Health [place]:  Mental Health and Substance Use Crisis Services for [employer] are managed by: Designer, industrial/product Health  Provides mental health and substance use services, crisis services, and referrals to treatment to residents of [place].  Crisis Line: In a crisis, call [phone] to speak to a trained professional. Available 24 hours a day, 7 days a week.  Access Line: For referrals to counseling, treatment, and other behavioral health resources, call the access line at [phone]   For more information, visit: [email or web address]     24/7 Mobile Crisis:  [facility] Crisis: [phone] - [phone] [place], [place], [place], [place], [place], [place], [place])  [facility] Mobile Crisis: [phone] [place], [place], [place], [place], [place], [place])    Other Mobile Crisis:   [facility] --- MORES [facility] Response, Engagement, and Stabilization) Phone: [phone] Hours: M-F 10AM-10:30PM  After hours call: [phone]    Behavioral Health Urgent Care:  1) [facility] [place], [place], [place], [place], [place], [place], [place]) Address: [address] [place], [place] [address]  Phone: [phone]  Hours: 24 hours a day    2) [facility]  [address] [place] [id number]  [phone] Sunday - Saturday - 24 hours/day    3) Behavioral Health Urgent Care [facility]): Walk-in crisis services for mental health and substance use.  Phone:  [phone]   Address:   [address] [facility]  [place], [place] [address]  *Entrance at back*  Hours:  Monday-Thursday:  8 a.m. - 7 p.m.  Friday:  8 a.m. - 3 p.m.  Our goal    The Behavioral Health Urgent Care's goal is to assist our community members in filling their immediate mental health needs, including medication bridges, safety assessments, and  crisis counseling as well as making thoughtful connections to ongoing services to assure continuity of care.  We serve individuals in [place] who are experiencing mental health crises, including substance abuse problems that may complicate other mental health issues. At the [facility], our goal is to fill the gap in the crisis continuum and make sure help is available whenever you need it. Our team will see you right away, and you do not need an appointment or referral to visit.  An individual should come to our Behavioral Health Urgent Care if they:   Are having thoughts about harming themselves or others  Need medications to address mental health concerns  Are in danger of running out of mental health medications before they can attend an appointment with their provider  Have a child who is required to complete a safety assessment with a mental health professional before they can return to school  Are feeling overwhelmed and are in need of immediate assistance        [facility]  [address] [place], [place] [address]  Phone: [phone]   Mental Health Services - individual and group counseling, medication management  Substance Use - outpatient treatment, Intensive Outpatient Treatment, group support   Has other locations in [place] Area      ACT Teams (Assertive Community Treatment):  [place] ACT Team  Phone: [phone]    [facility] Services ACT Team:   [phone]    [facility] Psychiatry Outpatient Services  [phone]  [address]  [address]  [place], [place] [address]    [facility]  https://mercer.biz/  [facility] is a nonprofit community center for people with serious mental illness providing employment, education, social opportunities, and more.   Phone: [phone]  [address] [place], [place] [address]    [facility]   [facility] [place], [place] - [facility]  Provides individual and group grief counseling.  [address]  [place], [place] [address]  [phone]    STEP (Schizophrenia Treatment and Evaluation Program)  http://www.thomas.biz/  Outpatient clinic that helps individuals and families dealing with serious mental illness to make strides toward health and recovery.  Serves people diagnosed with schizophrenia, bipolar disorder, and severe depression.  [address] [facility]  [address]  Dava Najjar [place], [place] [address]  [phone]    60 Suicide and Crisis Lifeline:  ShopAutomobile.at      [employer]  https://www.caramore.org/  Mission: to empower people with severe and persistent mental illness (SPMI) to live meaningful and independent lives by fostering strong work Administrator, Civil Service and creating a safe and supportive environment where the population we serve is given space to flourish. [employer] offers residential services, a Engineer, drilling, and peer support services.    [address]  [place], [place] [address]  [phone]    [facility] - [phone]  https://freedomhouserecovery.org/  [facility] is a non-profit behavioral health care agency that provides a broad continuum of person-centered, comprehensive services for children, adolescents, adults and families who suffer from behavioral issues, mental illness or addiction.  [address] [place]  Mobile Crisis: [phone]  Crisis Unit: [phone]     [facility]  https://elfuturo-Dyer.org/  [facility] is a one-of-a-kind place where Spanish-speaking immigrants can access culturally-responsive mental health services.   [facility] offers the following services:  DWI Assessments  and Treatment  Comprehensive Mental Health Evaluations   Individual, Family, and Group Treatment   Substance Abuse Evaluation and Treatment   Psychiatric Treatment   [address]  [place], [place] [address]  [phone]    [facility] (must have private insurance)  https://www.mindpathcare.com/  [facility] [place] [facility] is an outpatient mental and behavioral healthcare provider that strives to offer world-class mindcare to anyone who may need support.   [address]  [place], [place] [address]  Phone: [phone]    [facility]   http://www.white-smith.com/  Offers behavioral health counseling services to their patients - more info here:  Contact Clydie Braun Current, LCSW [phone]     [employer]  ThemeLizard.no  [employer] [facility], Inc.) is a 501(c)(3) whose vision is to empower individuals living with mental health challenges to find a way of becoming self-sufficient and have an improved quality of life. We do this by providing an array of clinically appropriate, person-centered, and flexible treatment, rehabilitation, and support services.  [address]  [place], [place] [address]  [phone]    [facility]  https://carolinabehavioralcare.com/  Offers psychiatric and therapy services for people with behavioral health needs.  Accepts Medicaid and private insurance.   Locations in [place] and [place].    [facility] / [facility]  Comprehensive Outpatient Substance Abuse Treatment Clinic-  [phone]    [address] [place]  Group therapy and individual therapy.     [facility]  Individual and group Substance abuse services, Intensive  Outpatient therapy offered.   [phone]  [address], [place], [place] [address]    [facility]  Offers a continuum of comprehensive treatment services to individuals and families affected by substance use and mental health disorders.  Phone: [phone]   Intake Coordinator, Adair Patter, at [phone], ext. [phone].  Website: GuamGaming.ch   Individual Counseling  DBT Therapy  Psychiatric Medication Management  Opioid Treatment  Substance Abuse Intensive Outpatient Program (SAIOP): SAIOP provides a structured program that includes individual and group counseling for clients struggling with substance use concerns. The program is offered three hours per day and three times per week. In addition to counseling, this program also provides:  Urine Drug Screening  Case Management  Disease Management Strategies  Life Skills Relapse Prevention  Social Support Family Counseling    [facility]  Inpatient, Outpatient, and Intensive Outpatient Programs for psychiatric, addictive, and co-occurring disorders.  [address] [place], [place] [address]   Phone (24/7): [phone]    Inpatient substance use and alcohol use treatment:    [facility]  Address: [address] [place], [place] [address]  Phone: [phone]     Warnell Bureau. [facility] is one of three state operated [place] Alcohol and Drug Abuse Treatment Centers (ADATCs) specifically designed to provide medically-monitored detoxification/crisis stabilization, and short-term treatment preparing adults with substance use and co-occurring disorders for ongoing community-based recovery services.     Admission is available to any adult in our region regardless of financial resources or insurance status. Patients pay on a sliding scale according to their income.     How to admit: Walk-in admission as well as admission via referral from a provider. If adults who are referred for services or walk-in for an assessment are not admitted, they are provided with  a referral to an appropriate level of care.    The [facility] operates and provides treatment services through the following programs:   [facility] - Provides inpatient treatment for substance use specific and/or co-occurring substance use and mental health diagnoses.  [facility]arnell Bureau. [facility] High Risk Perinatal Program - Provides inpatient treatment for pregnant women seeking addition treatment  [facility] (OTP) - Provides inpatient treatment for opioid addiction.  Warnell Bureau.  [facility] - Provides short-term inpatient psychiatric treatment.  [facility] OUTPATIENT Opioid Treatment Program (OTP) - Provides outpatient treatment for opioid addiction through the use of Medication Assisted Treatment (MAT)    [facility], Outpatient, and Intensive Outpatient Programs for psychiatric, addictive, and co-occurring disorders.  [address] [place], [place] [address]   Phone (24/7): [phone]    [facility] Longterm recovery, overnight shelter, non-medical detox, and family services for adults over 53 in [facility] [address] [place]   [phone]  healing-transitions.org       TROSA:  TROSA is a free multi-year residential program that empowers people with substance use disorders to be productive, recovering individuals by providing comprehensive treatment, experiential vocational training, education, and continuing care.  Free 2 year residential recovery (alcohol and/or substance use) program located in [place]. Program is voluntary only, meaning the person seeking treatment must be willing to participate.  Website: DiscoReview.be  Admissions phone: [phone]    Toll-free number: [phone]   Address: [address], [place], [place] [address]    [facility]:  Website: https://www.freedomhouserecovery.org/  [facility]  Facility-based Crisis and Detoxification Unit, as well as outpatient services for substance and alcohol use.  Address: [address], [place], [place] [address]  Phone: [phone]  Crisis Unit: [phone]  Email: administrator@fhrecovery .org     Alcoholics Anonymous Telephone Hotlines in [place]:  You can call any helpline anywhere to get help now, and talk to another peer with alcoholism now.    [phone], [place], [place], [place], [place]    [phone] - [address] [place]    [phone] - [address], [place], [place] counties    [phone] - [address], [place]    [phone] - Teresa CoombsDarlyn Chamber [place]    [phone] - Lita Mains, [place]    AA meeting schedule-   [facility]- WedBuilder.no   [facility]-  Http://www.aanc32.org/  [facility] - TheWarfare.it    Narcotics Anonymous  Use any of these methods to find a meeting in [place].  Website: https://ncregion-na.org/   Nurse, adult and print the [employer].  Use the online [employer] using Map or [employer] the [employer] number at 1-855-227-NCNA (862)481-7153)  Send a text with your address to  1-855-227-NCNA (412)532-2808) to receive a text with the next 5 upcoming meetings closest to your location    Smart Recovery  https://trianglesmartrecovery.org  Meeting Schedule  SMART Recovery meetings in the [place] area are listed below. Click on the location (in blue) for full details including directions, a map, and contact information.  [place], 10:00 AM, Saturdays, [employer].   [place], 7:30 PM Mondays, [facility].   [place], 7:30 PM, Tuesdays, [address]   [place], 7:30 PM Wednesdays, [facility]; 7:30 PM Mondays,  Sunrise Youth worker.   [place], 7:30 PM Thursdays, [address] [employer].   [place], 5:45 PM Tuesdays, [employer].   [place], 7:30 PM Tuesdays, [employer].   [place], 10:00 AM Saturdays, [employer].  There also are other SMART Recovery meetings in [place]. Check the national site for more information.    LifeRing: This secular group provides a healthy network of peers focused on remaining abstinent from drugs and alcohol.  Website: BingoPublishing.hu  Online meetings: http://www.ramirez.info/ (free)    Women for Sobriety (WFS): This nonprofit, abstinence-based program is made up of women supporting each other in recovery.  Main website: http://www.flowers.com/  Program overview: idmtechnology.com  Meetings: StickerEmporium.tn *there is a suggested donation of $2-5 per meeting but not required    Cocaine Anonymous  https://ca-[place].org/  Please call [phone] with information about meetings not listed below  [facility]: Hal Morales [phone]  [place] Area Contact: Rona Ravens [phone]  [place] Area Contact: Damaris Hippo [phone]    Crystal Meth Anonymous  [place] meetings: http://garrett.com/   For HELP, contact CMA???s 24-Hour Helpline.  We are available to provide information and offer support to anyone seeking recovery from crystal meth addiction.  The Helpline is not a crisis hotline. We cannot respond to emergencies or answer medical or legal questions.  Call the CMA Helpline at: (855) METH-FREE : ([phone]       Harm Reduction Resources in [place], [place]  For more information: http://www.mcclure.com/  The purpose of harm reduction is to decrease risk.  Harm reduction methods keep the community as a whole safer. Harm reduction seeks to ???meet people where they are???, provide the tools that they need to be safe and keep the community safe, and build relationships in order to be available to help support as needed in the future.  Methods include:  Naloxone and fentanyl distribution and training for overdose prevention  Medication assisted treatment (methadone and suboxone/buprenorphine)  Safe syringe programs  Outreach that includes harm reduction education and connections to treatment, if desired.      Where to get Naloxone, Safe Syringes, & Harm Reduction Materials:    [facility] Department  Offers free Naloxone kits and training by appointment at 2 locations:  Call [phone]  TanExchange.nl    [facility]  If you are over 18 you can request a Narcan kit from any active [facility] ambulance. Text [phone]  People over 18 may request a sterile syringe kit from any active [facility] ambulance. Disposal available.    Free Vending Machines  [facility] - open 24/7 - [address], [place], [place] - Games developer in lobby  [place] [employer] - open 8am to 5pm - [address] [place], [place] - Free Narcan and Fentanyl test strips vending machine in lobby    The [employer] Harm Reduction Coalition  The [facility] offers Naloxone to active IV drug users, people on medication-assisted treatment, people who are formerly incarcerated with a history of opiate use, people engaged in sex work or people who identify as transgender. If you fit these criteria and would like access to a kit, please contact Loftin Wilson at [employer] .org    [facility]    The [employer]: Distributes Naloxone kits to justice-involved individuals in [place].  Megan Pickard: cpickard@[place]ountync .gov, 812-526-2829  Jovita Gamma: azirkel@orangecountync .gov, [phone]  SOHRAD: Street Outreach, Harm  Reduction and Deflection Program  sohrad@[place]ountync .gov, 431-641-9303  LRC: Local Reentry Council  Mattie Marlin: bgear@orangecountync .gov  Local Pharmacies: Narcan can be purchased without a prescription at many local pharmacies including:  Any CVS or Walgreens, same-day pickup or delivery  [facility] Outpatient Pharmacy: [address] [place], [place]  [facility]: [address] [place], [place], [place] [address]    [employer] Health: distributes Naloxone to students for free without a prescription  [employer], open M-F, 9am-5pm  Marine scientist, open M-F, 9am-6pm    The [employer] provides syringes and disposal bins. No appointment needed. Walk-ins welcomed Monday-Friday 8:30am-4:30 pm. Please note hours of operation can fluctuate, call in advance [phone]  [address], [place], [place] [address]  Southern Building [address], [place], [place] [address]  http://lawson-lopez.info/    Student Action Health Coalition [facility])  The [facility] team distributes harm reduction supplies including syringes, bandages, antibiotic ointment, masks, hand sanitizer, and Narcan at [facility] in [place] on the First and Third Tuesday of every month. They also offer safe disposal of syringes.  [employer])  [address] [place], [place] [address]  email: shac_[email or web address] .[email or web address]://herrera-[email or web address]/  WikiBlast.com.cy    [place] Harm Reduction Coalition [facility])  Contact: Loftin Wilson [phone]; loftin@nchrc .org  Mobile exchange on Wednesdays 1pm-6pm, call or text for locations.  Fixed-site exchange on Fridays, 3pm-6pm at [address] in  [place], inside the office of [employer] of [employer]    [employer] Rural Harm [employer] .com  Mobile delivery available 7 days a week. Call or Text [phone]  for delivery. Safe syringes and disposal. Deliver anywhere in [facility] Injection advice from [employer] Harm Reduction  Coalition:  VerifiedStats.hu    [place] FIT: Formerly Incarcerated Transition Program  Tommy Green: hgreen@orangecountync .gov        Medication/Medical Care Assistance:     HIV Medication Assistance:  HIV Medication Assistance Program (HMAP)  Medication assistance program for low-income patients diagnosed w/ HIV to afford medications.  HMAP Client Hotline: (959)112-2190   To enroll, complete a Juanell Fairly Program Application:  Form 1: Financial Eligibility and Authorization   Form 2: Eligibility Documentation   Form 3: Income Dentist   Form 4: Verification of No or Low Income  Form 5:  Declaration of Residence  Form 6:  of Self Employment Income (complete if self-employed)  Mail completed application to: [place] Department of Health and Health and safety inspector, Division of Manufacturing engineer of Medical Care Services [address] [place], [place] [address]     HIV/AIDS Hotline: In [place]: 317-058-3891  Call for questions and connections to resources/treatment  [facility] Financial Assistance  Assistance w/ medical bills at [facility] facilities for low-income individuals.  English Application  Spanish Application    Contact:  Speak with a Financial Navigator Monday through Thursday, 8:30 a.m. - 4:30 p.m. and Friday, 8:30 a.m. - 12:30 p.m. For your privacy and security, you will need to provide your account number and other information before we can discuss your account.  [phone] (toll-free)  [phone]     [facility] Pharmacy Assistance  Assistance w/ medications prescribed at [facility] facilities for low-income individuals w/out insurance.  English Application  Spanish Application    Counselors are available Monday - Friday, from 8 am - 4:30 pm, to discuss the PAP program and answer your questions.  They???re located in the [employer], on  the ground floor of the [facility], and also at the [facility]. You can also contact a Pharmacy Assistance Counselor by phone at [phone].     NeedyMeds.org  Website that includes resources and information on locating affordable medications / medication assistance:  NeedyMeds   HELPLINE [phone]     Domestic Violence / Sexual Assault:    [employer] for Women and Families: assistance for survivors of domestic violence/abuse  24/7 hotline: [phone]  [address]  [place], [place]  [address]   http://warner.com/     [facility] Rape Crisis Center:  24-Hour Helplines:   Phone: [phone]  Text: [phone]  [address]  [place], [place] [address]  http://garrett.com/

## 2022-10-02 NOTE — Unmapped (Cosign Needed)
Internal Medicine (MEDU) Progress Note    Assessment & Plan:   Jerome Kennedy. is a 47 y.o. male whose presentation is complicated by a history of complex ileocolonic Crohn's disease s/p prior ileocolonic resections currently treated with Humira  that presented to [facility] with 2-days of RLQ abdominal pain, nausea/vomiting, and left-hand swelling.     Principal Problem:    Nausea & vomiting  Active Problems:    Crohn's disease (CMS-HCC)    Abdominal pain  Resolved Problems:    * No resolved hospital problems. *        Active Problems    1) RLQ abdominal pain/nausea/vomiting - improving  Presented with 2 days acute RLQ abdominal pain/nausea/vomiting, with a CT A&P and concerning for a Crohn's flare. Vitals/Labs were notable for an afebrile course, elevated CRP/SedRate and normal WBC. GI was consulted and recommended colonoscopy to evaluate disease progression before possible medication changes. Colonoscopy on 2/28 without signs of active inflammation and ongoing flare. No medication changes recommenced by GI team and was safe for discharge  - f/up Humira level and [id number]*05 genotype for Humira efficacy  - promethazine PRN for nausea     2) Left hand swelling - improving  He presented with significant left hand swelling with history of left hand surgery on 08/07/2022. X-ray of left hand was consistent with post-operative soft tissue swelling and no evidence for soft tissue gas or other overt signs of acute infection. Today (09/30/22), swelling had decreased significantly and hand was not red or warm. He denies pain or tenderness to palpation of the hand. Given improving course, swelling is most likely post-surgical.   - outpatient orthopedics follow-up scheduled for 10/08/22.    3) Asymptomatic Hypocalcemia  Serum calcium ranging from 7.7-8.1.   - CTM    Chronic Problems    4) Ventral Hernia - stable  Reducible ventral hernia was noted and was not tender to palpation. Remains having BM and passing flatus. Will continue to monitor      5) Hypercoagulable state - stable  - enoxaparin syringe 40 mg Q24.     5) Bipolar Disorder - stable  - quetiapine 100 mg daily.       Issues Impacting Complexity of Management:  -Discussion of risks/benefits of deferring colonoscopy  with the patient/family including risk of risk factor: infection and medical complications.  -The patient is at high risk of complications from Crohn's disease      Daily Checklist:  Diet: Regular Diet  DVT PPx: Lovenox 40mg  q24h  Electrolytes: Replete Potassium to >/= 3.6 and Magnesium to >/= 1.8  Code Status: Full Code  Dispo: Goal Discharge: pending colonoscopy        Interval History:   No acute events overnight. Patient states it is too late to go home and would like to be discharged tomorrow morning     All other systems were reviewed and are negative except as noted in the HPI    Objective:   Temp:  [36.3 ??C (97.4 ??F)-37.3 ??C (99.1 ??F)] 36.3 ??C (97.4 ??F)  Heart Rate:  [74-100] 84  SpO2 Pulse:  [74-86] 84  Resp:  [8-16] 8  BP: (80-107)/(54-78) 95/68  SpO2:  [94 %-100 %] 100 %    Gen: NAD, converses   HENT: atraumatic, normocephalic  Heart: RRR  Lungs: CTAB, no crackles or wheezes  Abdomen:  non-distended, large reducible ventral hernia  Extremities: No edema

## 2022-10-02 NOTE — Unmapped (Signed)
Physician Discharge Summary [facility]  GI PERIOP [facility]  [address]  [place] [address]  Dept: [phone]  Loc: [phone]     Identifying Information:   Jode Lay  March 06, 1976  [id number]    Primary Care Physician: Pcp, None Per Patient     Code Status: Full Code    Admit Date: 09/28/2022    Discharge Date: 10/01/2022     Discharge To: Home    Discharge Service: [facility] - General Medicine Floor Team (MED Aggie Hacker)     Discharge Attending Physician: Delanna Ahmadi, MD    Discharge Diagnoses:  Principal Problem:    Nausea & vomiting (POA: Yes)  Active Problems:    Crohn's disease (CMS-HCC) (POA: Yes)    Abdominal pain (POA: Yes)  Resolved Problems:    * No resolved hospital problems. *      Outpatient Provider Follow Up Issues:   GI for treatment options    Hospital Course:   Mr. Fadness is a 47 y.o. man with a history of long standing complex ileocolonic Crohn's disease s/p prior ileocolonic resections currently treated with Humira, COPD, DVT, and bipolar disorder who presented on 2/25 to the [facility] ED with 2-days RLQ abdominal pain, nausea/vomiting and left hand swelling. He was admitted by [facility] for evaluation of RLQ abdominal pain and nausea/vomiting with concern for Crohns flare. He is now stable to discharge home with plan for outpatient GI follow-up.      Below are more details of his stay at [facility] by problem list:     1) RLQ abdominal pain/nausea/vomiting with chronic IBD  Mr. Gerilyn Pilgrim presented with 2 days acute RLQ abdominal pain/nausea/vomiting that started on 2/23, with a CT A&P and concerning with a Crohn's flare. Patient has been coming to the ED for RLQ abdominal pain for the past month. Vitals/Labs were notable for an afebrile course, elevated CRP/SedRate and normal WBC. C. Diff and stool pathogen panel were ordered to exclude infection. GI was consulted and recommended colonoscopy to evaluate disease progression before possible medication changes. By 09/30/22, patient was able to tolerate PO intake and reported improved appetite, well-formed stools, decreased pain, no vomiting. Given improving disease course and lack of fever/white count, there was little concern for infection, peritonitis, fistula, obstruction, and abscess. Patient tolerated bowel prep via NG tube (2/27) well and and reported 3X bowel movements with clear watery stools overnight. Colonoscopy was performed on 2.28 but prep was poor and they could not visualize. GI recommendations post-colonoscopy were to continue current meds and then follow up as scheduled. Patient was discharged on 2.28, and was counseled on the importance of regular follow-up and has outpatient GI appointment scheduled on 10/17/22.     2) Left hand swelling  He presented with significant left hand swelling localized to the radial thumb and 1st MCP joint with history of left hand surgery on 08/07/2022. X-ray of left hand was consistent with post-operative soft tissue swelling and no evidence for soft tissue gas or other overt signs of acute infection. By 2/27, swelling had decreased significantly and hand was not red or warm. He denied pain or tenderness to palpation of the hand. Given improving course, swelling is most likely post-surgical. Patient has outpatient orthopedics follow-up scheduled for 10/08/22.    3) Hypocalcemia  Patient presented with serum Ca around 7.7-8.1 without symptoms of muscle aches or twitching. Patient was counseled on Ca/VitD supplements.      Chronic Problems    Ventral Hernia  Stable.  Reducible ventral hernia was noted and was not tender to palpation.     Hypercoagulable state  Stable. Patient was given enoxaparin syringe 40 mg Q24 during the admission.     Bipolar Disorder  Stable. Patient was continued on quetiapine 100 mg once daily.               Procedures:  colonoscopy  No admission procedures for hospital encounter.  ______________________________________________________________________  Discharge Medications:     Your Medication List        START taking these medications      budesonide-formoterol 80-4.5 mcg/actuation inhaler  Commonly known as: SYMBICORT  Inhale 2 puffs two (2) times a day.     promethazine 12.5 MG tablet  Commonly known as: PHENERGAN  Take 1 tablet (12.5 mg total) by mouth every six (6) hours as needed for up to 7 days.            CONTINUE taking these medications      ascorbic acid (vitamin C) 250 MG tablet  Commonly known as: VITAMIN C  Take 1 tablet (250 mg total) by mouth every other day. Take with oral iron     B complex vitamins tablet  Commonly known as: B COMPLEX-VITAMIN B12  Take 1 tablet by mouth daily.     calcium carbonate 200 mg calcium (500 mg) chewable tablet  Commonly known as: TUMS  Chew 2 tablets (400 mg of elem calcium total) two (2) times a day.     cholecalciferol (vitamin D3 25 mcg (1,000 units)) 1,000 unit (25 mcg) tablet  Take 1 tablet (25 mcg total) by mouth daily.     empty container Misc  Use as directed to dispose of Humira     ferrous sulfate 325 (65 FE) MG tablet  Take 1 tablet (325 mg total) by mouth every other day.     fluticasone propionate 50 mcg/actuation nasal spray  Commonly known as: FLONASE  1 spray into each nostril every morning.     HUMIRA(CF) PEN 40 mg/0.4 mL injection  Generic drug: adalimumab  Inject the contents of 1 pen (40 mg total) under the skin every seven (7) days.     magnesium oxide 400 mg (241.3 mg elemental) tablet  Commonly known as: MAG-OX  Take 1 tablet (400 mg total) by mouth two (2) times a day.     nicotine 21 mg/24 hr patch  Commonly known as: NICODERM CQ  Place 1 patch on the skin daily.     pantoprazole 40 MG tablet  Commonly known as: Protonix  Take 1 tablet (40 mg total) by mouth daily.     QUEtiapine 100 MG tablet  Commonly known as: SEROquel  Take 1 tablet (100 mg total) by mouth nightly.              Allergies:  Morphine and Toradol [ketorolac]  ______________________________________________________________________  Pending Test Results (if blank, then none):  Pending Labs       Order Current Status    Adalimumab/Adalimumab Ab In process    Blood Culture #1 Preliminary result    Blood Culture #2 Preliminary result            Most Recent Labs:  All lab results last 24 hours -   Recent Results (from the past 24 hour(s))   Basic Metabolic Panel    Collection Time: 10/01/22 10:21 AM   Result Value Ref Range    Sodium 137 135 - 145 mmol/L    Potassium 4.2  3.4 - 4.8 mmol/L    Chloride 107 98 - 107 mmol/L    CO2 24.0 20.0 - 31.0 mmol/L    Anion Gap 6 5 - 14 mmol/L    BUN 13 9 - 23 mg/dL    Creatinine 8.11 9.14 - 1.18 mg/dL    BUN/Creatinine Ratio 15     eGFR CKD-EPI (2021) Male >90 >=60 mL/min/1.53m2    Glucose 80 70 - 179 mg/dL    Calcium 7.8 (L) 8.7 - 10.4 mg/dL   Magnesium Level    Collection Time: 10/01/22 10:21 AM   Result Value Ref Range    Magnesium 1.7 1.6 - 2.6 mg/dL   CBC w/ Differential    Collection Time: 10/01/22 10:21 AM   Result Value Ref Range    WBC 5.8 3.6 - 11.2 10*9/L    RBC 4.11 (L) 4.26 - 5.60 10*12/L    HGB 12.2 (L) 12.9 - 16.5 g/dL    HCT 78.2 (L) 95.6 - 48.0 %    MCV 91.9 77.6 - 95.7 fL    MCH 29.8 25.9 - 32.4 pg    MCHC 32.4 32.0 - 36.0 g/dL    RDW 21.3 (H) 08.6 - 15.2 %    MPV 8.1 6.8 - 10.7 fL    Platelet 283 150 - 450 10*9/L    Neutrophils % 56.0 %    Lymphocytes % 32.7 %    Monocytes % 9.6 %    Eosinophils % 1.3 %    Basophils % 0.4 %    Absolute Neutrophils 3.3 1.8 - 7.8 10*9/L    Absolute Lymphocytes 1.9 1.1 - 3.6 10*9/L    Absolute Monocytes 0.6 0.3 - 0.8 10*9/L    Absolute Eosinophils 0.1 0.0 - 0.5 10*9/L    Absolute Basophils 0.0 0.0 - 0.1 10*9/L    Anisocytosis Slight (A) Not Present       Relevant Studies/Radiology (if blank, then none):  Colonoscopy    Result Date: 10/01/2022  _______________________________________________________________________________ Patient Name: Doroteo Sydney           Procedure Date: 10/01/2022 3:43 PM MRN: [id number]                     Date of Birth: 06/17/1976 Admit Type: Inpatient                 Age: 47 Room: GI [facility] OR [facility]          Gender: Male Note Status: Finalized                Instrument Name: [id number] _______________________________________________________________________________  Procedure:             Colonoscopy Indications:           Crohn's disease of the colon, Crohn's disease of the                        small bowel Providers:             Lajoyce Lauber (Fellow), Johny Sax ARENDSHORST, RN,                        Kathlee Nations HILL, Hunt Oris, MD Referring MD:          Jeanann Lewandowsky (Referring MD) Medicines:             Propofol per Anesthesia Complications:         No immediate  complications. _______________________________________________________________________________ Procedure:             Pre-Anesthesia Assessment:                        - Prior to the procedure, a History and Physical was                        performed, and patient medications and allergies were                        reviewed. The patient's tolerance of previous                        anesthesia was also reviewed. The risks and benefits                        of the procedure and the sedation options and risks                        were discussed with the patient. All questions were                        answered, and informed consent was obtained. Prior                        Anticoagulants: The patient has taken no anticoagulant                        or antiplatelet agents. ASA Grade Assessment: III - A                        patient with severe systemic disease. After reviewing                        the risks and benefits, the patient was deemed in                        satisfactory condition to undergo the procedure.                        After obtaining informed consent, the scope was passed                        under direct vision. Throughout the procedure, the                        patient's blood pressure, pulse, and oxygen                        saturations were monitored continuously. The                        Colonoscope was introduced through the anus and                        advanced to the the transverse colon. The quality of                        the bowel preparation was poor. Rectum, sigmoid colon,  descending colon were photographed.                                                                                Findings:      A large amount of semi-liquid semi-solid stool was found in the entire      colon, precluding visualization. This made the endoscopy challenging and      the procedure had to be aborted because the ileocolonic anastomosis was      reached.      The Simple Endoscopic Score for Crohn's Disease was determined based on      the endoscopic appearance of the mucosa in the following segments:      Segment score: 0. Segment score: 0. Segment score: 0.      - Left Colon: Based on visualized mucosa, findings include no ulcers      present, no ulcerated surfaces, no affected surfaces and no narrowings.      Segment score: 0.      - Rectum: Based on visualized mucos, findings include no ulcers present,      no ulcerated surfaces, no affected surfaces and no narrowings. Segment      score: 0.      - Total SES-CD aggregate score: 0.                                                                                Impression:            - Stool in the entire examined colon.                        - Simple Endoscopic Score for Crohn's Disease: 0,                        mucosal inflammatory changes secondary to Crohn's                        disease, in remission.                        - No specimens collected. Recommendation:        - Further recommendations can be made by the GI                        consult team. Please contact them if necessary.                        - Return patient to hospital ward for ongoing care.                        - Resume previous diet.                        -  Continue present medications.                        - Repeat colonoscopy at appointment to be scheduled                        for screening purposes.                                                                                Procedure Code(s):     --- Professional ---                        [phone], 53, Colonoscopy, flexible; diagnostic,                        including collection of specimen(s) by brushing or                        washing, when performed (separate procedure) Diagnosis Code(s):     --- Professional ---                        K50.10, Crohn's disease of large intestine without                        complications                        K50.00, Crohn's disease of small intestine without                        complications     CPT copyright 2022 [employer]. All rights reserved.     The codes documented in this report are preliminary and upon coder review may be revised to meet current compliance requirements.     Electronically Signed By Jory Ee, MD _________________ Hunt Oris, MD 10/01/2022 4:38:49 PM The attending physician was present throughout the entire procedure including the insertion, viewing, and removal of the endoscope. This procedure note has been electronically signed by: Jory Ee , MD     _________________ Lajoyce Lauber, Number of Addenda: 0     Note Initiated On: 10/01/2022 3:43 PM    Upper Endoscopy    Result Date: 10/01/2022  _______________________________________________________________________________ Patient Name: Cai Phipps           Procedure Date: 10/01/2022 3:45 PM MRN: [id number]                     Date of Birth: 1975-12-16 Admit Type: Inpatient                 Age: 20 Room: GI [facility] 04 [facility]          Gender: Male Note Status: Finalized                Instrument Name: [id number] _______________________________________________________________________________  Procedure:             Upper GI endoscopy Indications:  Epigastric abdominal pain Providers:             Hunt Oris, MD, Lajoyce Lauber (Fellow), Kandice Robinsons, RN, Kathlee Nations. HILL Referring MD:          Jeanann Lewandowsky (Referring MD) Medicines:             Monitored Anesthesia Care Complications:         No immediate complications. _______________________________________________________________________________ Procedure:             Pre-Anesthesia Assessment:                        - Prior to the procedure, a History and Physical was                        performed, and patient medications and allergies were                        reviewed. The patient's tolerance of previous                        anesthesia was also reviewed. The risks and benefits                        of the procedure and the sedation options and risks                        were discussed with the patient. All questions were                        answered, and informed consent was obtained. Prior                        Anticoagulants: The patient has taken no anticoagulant                        or antiplatelet agents. ASA Grade Assessment: III - A                        patient with severe systemic disease. After reviewing                        the risks and benefits, the patient was deemed in                        satisfactory condition to undergo the procedure.                        After obtaining informed consent, the endoscope was                        passed under direct vision. Throughout the procedure,                        the patient's blood pressure, pulse, and oxygen                        saturations were monitored continuously.  The Endoscope                        was introduced through the mouth, and advanced to the                        second part of duodenum. The upper GI endoscopy was                        accomplished without difficulty. The patient tolerated                        the procedure well. After obtaining informed consent,                        the endoscope was passed under direct vision.                        Throughout the procedure, the patient's blood                        pressure, pulse, and oxygen saturations were monitored                        continuously.                                                                                Findings:      A small hiatal hernia was present.      The exam of the esophagus was otherwise normal.      The entire examined stomach was normal.      The examined duodenum was normal.      There was no evidence of Crohn's disease seen on the EGD.                                                                                Estimated Blood Loss:      Estimated blood loss: none. Impression:            - Small hiatal hernia.                        - Normal stomach.                        - Normal examined duodenum.                        - No specimens collected. Recommendation:        - Further recommendations can be made by the GI  consult team. Please contact them if necessary.                        - Return patient to hospital ward for ongoing care.                                                                                Procedure Code(s):     --- Professional ---                        [phone], Esophagogastroduodenoscopy, flexible,                        transoral; diagnostic, including collection of                        specimen(s) by brushing or washing, when performed                        (separate procedure) Diagnosis Code(s):     --- Professional ---                        R10.13, Epigastric pain     CPT copyright 2022 [employer] Medical Association. All rights reserved.     The codes documented in this report are preliminary and upon coder review may be revised to meet current compliance requirements.     Electronically Signed By Jory Ee, MD _________________ Hunt Oris, MD 10/01/2022 4:36:45 PM The attending physician was present throughout the entire procedure including the insertion, viewing, and removal of the endoscope. This procedure note has been electronically signed by: Jory Ee , MD     _________________ Lajoyce Lauber, Number of Addenda: 0     Note Initiated On: 10/01/2022 3:45 PM    XR Abdomen 1 View    Result Date: 10/01/2022  EXAM: XR ABDOMEN 1 VIEW ACCESSION: [id number] UN     CLINICAL INDICATION: 47 years old with NGT (CATHETER VASCULAR FIT & ADJ)      COMPARISON: CT abdomen pelvis 09/22/2022     TECHNIQUE: Upright view of the abdomen, 1 images(s)     FINDINGS: Enteric tube  with tip projecting at the level of the diaphragm. Surgical clips project over the right upper quadrant. No dilated loops of bowel partially visualized abdomen. Lung bases are clear.         Enteric tube in place with tip projecting at the level of the diaphragm. Advancement by 7 to 10 cm is recommended.        ______________________________________________________________________  Discharge Instructions:   Activity Instructions       Activity as tolerated              Diet Instructions       Discharge diet (specify)      Discharge Nutrition Therapy: Regular            Other Instructions       Call MD for:  difficulty breathing, headache or visual disturbances      Call MD for:  extreme fatigue  Call MD for:  persistent dizziness or light-headedness      Call MD for:  persistent nausea or vomiting      Call MD for:  severe uncontrolled pain      Call MD for: Temperature > 38.5 Celsius ( > 101.3 Fahrenheit)      Discharge instructions      Mr. Gawronski, you were admitted to [facility] due to abdominal pain and evaluated by the GI team.  A scope was not able to evaluate your colon due to a poor prep and remaining stool.  That said, it seems like your symptoms are improved/stable and the GI team recommends ongoing use of your adalimumab and follow up with them in clinic to discuss ongoing treatment options.  You can use promethazine as needed for nausea control.      Follow up with your PCP in the next 1-2 weeks to see how you are recovering and discuss your other chronic medical conditions.  Be sure to take your current  medications and a list of questions so your providers can better guide your care.                 Follow Up instructions and Outpatient Referrals     Call MD for:  difficulty breathing, headache or visual disturbances      Call MD for:  extreme fatigue      Call MD for:  persistent dizziness or light-headedness      Call MD for:  persistent nausea or vomiting      Call MD for:  severe uncontrolled pain      Call MD for: Temperature > 38.5 Celsius ( > 101.3 Fahrenheit)      Discharge instructions          Appointments which have been scheduled for you      Oct 08, 2022 11:15 AM  (Arrive by 11:00 AM)  RETURN  HAND with [name], MD  [facility] REG[facility]) [address] [place] [address]  [phone]        Oct 17, 2022 11:00 AM  (Arrive by 10:45 AM)  RETURN  GENERAL with Jeanann Lewandowsky, MD  [facility] REGION) [address]  [address]  [place] [address]  [phone]             ______________________________________________________________________  Discharge Day Services:  BP 95/68  - Pulse 84  - Temp 36.3 ??C (97.4 ??F) (Temporal)  - Resp 8  - Ht 172.7 cm (5' 8)  - Wt 68 kg (150 lb)  - SpO2 100%  - BMI 22.81 kg/m??     Pt seen on the day of discharge and determined appropriate for discharge.    Condition at Discharge: stable    Length of Discharge: I spent greater than 30 mins in the discharge of this patient.

## 2022-10-02 NOTE — Unmapped (Signed)
Problem: Adult Inpatient Plan of Care  Goal: Plan of Care Review  Outcome: Progressing  Goal: Patient-Specific Goal (Individualized)  Outcome: Progressing  Goal: Absence of Hospital-Acquired Illness or Injury  Outcome: Progressing  Intervention: Identify and Manage Fall Risk  Recent Flowsheet Documentation  Taken 10/01/2022 2000 by Thomasenia Sales, RN  Safety Interventions:   fall reduction program maintained   lighting adjusted for tasks/safety   low bed   commode/urinal/bedpan at bedside   nonskid shoes/slippers when out of bed  Intervention: Prevent Skin Injury  Recent Flowsheet Documentation  Taken 10/01/2022 2000 by Thomasenia Sales, RN  Positioning for Skin: Supine/Back  Device Skin Pressure Protection: adhesive use limited  Skin Protection: adhesive use limited  Goal: Optimal Comfort and Wellbeing  Outcome: Progressing  Goal: Readiness for Transition of Care  Outcome: Progressing  Goal: Rounds/Family Conference  Outcome: Progressing     Problem: Infection  Goal: Absence of Infection Signs and Symptoms  Outcome: Progressing     Problem: Nausea and Vomiting  Goal: Nausea and Vomiting Relief  Outcome: Progressing     Problem: Fall Injury Risk  Goal: Absence of Fall and Fall-Related Injury  Outcome: Progressing  Intervention: Promote Injury-Free Environment  Recent Flowsheet Documentation  Taken 10/01/2022 2000 by Thomasenia Sales, RN  Safety Interventions:   fall reduction program maintained   lighting adjusted for tasks/safety   low bed   commode/urinal/bedpan at bedside   nonskid shoes/slippers when out of bed

## 2022-10-06 NOTE — Unmapped (Signed)
[facility] Refill Coordination Note    Specialty Medication(s) to be Shipped:   Inflammatory Disorders: Humira    Other medication(s) to be shipped: No additional medications requested for fill at this time     Jerome Kennedy., DOB: 1975-11-16  Phone: [phone] (home)       All above HIPAA information was verified with patient.     Was a Nurse, learning disability used for this call? No    Completed refill call assessment today to schedule patient's medication shipment from the [facility] [phone]).  All relevant notes have been reviewed.     Specialty medication(s) and dose(s) confirmed: Regimen is correct and unchanged.   Changes to medications: Rishith reports no changes at this time.  Changes to insurance: No  New side effects reported not previously addressed with a pharmacist or physician: None reported  Questions for the pharmacist: No    Confirmed patient received a Conservation officer, historic buildings and a Surveyor, mining with first shipment. The patient will receive a drug information handout for each medication shipped and additional FDA Medication Guides as required.       DISEASE/MEDICATION-SPECIFIC INFORMATION        For patients on injectable medications: Patient currently has 0 doses left.  Next injection is scheduled for 10/12/22.    SPECIALTY MEDICATION ADHERENCE     Medication Adherence    Specialty Medication: HUMIRA(CF) PEN 40 mg/0.4 mL  Patient is on additional specialty medications: No              Were doses missed due to medication being on hold? No    HUMIRA(CF) PEN 40 mg/0.4 mL injection (adalimumab)  : 0 days of medicine on hand       REFERRAL TO PHARMACIST     Referral to the pharmacist: Not needed      [facility]     Shipping address confirmed in Epic.     Patient was notified of new phone menu : No    Delivery Scheduled: Yes, Expected medication delivery date: 10/09/22.     Medication will be delivered via Same Day Courier to the prescription address in Epic WAM.    Angy Swearengin' W Danae Chen Shared [facility] Specialty Technician

## 2022-10-08 ENCOUNTER — Ambulatory Visit
Admit: 2022-10-08 | Payer: MEDICAID | Attending: Plastic and Reconstructive Surgery | Primary: Plastic and Reconstructive Surgery

## 2022-10-09 MED FILL — HUMIRA PEN CITRATE FREE 40 MG/0.4 ML: SUBCUTANEOUS | 28 days supply | Qty: 4 | Fill #3

## 2022-10-12 ENCOUNTER — Ambulatory Visit: Admit: 2022-10-12 | Discharge: 2022-10-12 | Disposition: A | Discharge status: Home / Self Care | Payer: MEDICAID

## 2022-10-12 DIAGNOSIS — R109 Unspecified abdominal pain: Principal | ICD-10-CM

## 2022-10-12 LAB — CBC W/ AUTO DIFF
BASOPHILS ABSOLUTE COUNT: 0 10*9/L (ref 0.0–0.1)
BASOPHILS RELATIVE PERCENT: 0.8 %
EOSINOPHILS ABSOLUTE COUNT: 0.2 10*9/L (ref 0.0–0.5)
EOSINOPHILS RELATIVE PERCENT: 2.6 %
HEMATOCRIT: 37.2 % — ABNORMAL LOW (ref 39.0–48.0)
HEMOGLOBIN: 12.2 g/dL — ABNORMAL LOW (ref 12.9–16.5)
LYMPHOCYTES ABSOLUTE COUNT: 1.8 10*9/L (ref 1.1–3.6)
LYMPHOCYTES RELATIVE PERCENT: 29.8 %
MEAN CORPUSCULAR HEMOGLOBIN CONC: 32.8 g/dL (ref 32.0–36.0)
MEAN CORPUSCULAR HEMOGLOBIN: 30 pg (ref 25.9–32.4)
MEAN CORPUSCULAR VOLUME: 91.2 fL (ref 77.6–95.7)
MEAN PLATELET VOLUME: 7.8 fL (ref 6.8–10.7)
MONOCYTES ABSOLUTE COUNT: 0.5 10*9/L (ref 0.3–0.8)
MONOCYTES RELATIVE PERCENT: 8.4 %
NEUTROPHILS ABSOLUTE COUNT: 3.6 10*9/L (ref 1.8–7.8)
NEUTROPHILS RELATIVE PERCENT: 58.4 %
PLATELET COUNT: 300 10*9/L (ref 150–450)
RED BLOOD CELL COUNT: 4.08 10*12/L — ABNORMAL LOW (ref 4.26–5.60)
RED CELL DISTRIBUTION WIDTH: 16.8 % — ABNORMAL HIGH (ref 12.2–15.2)
WBC ADJUSTED: 6.2 10*9/L (ref 3.6–11.2)

## 2022-10-12 LAB — COMPREHENSIVE METABOLIC PANEL
ALBUMIN: 2.3 g/dL — ABNORMAL LOW (ref 3.4–5.0)
ALKALINE PHOSPHATASE: 116 U/L (ref 46–116)
ALT (SGPT): 11 U/L (ref 10–49)
ANION GAP: 6 mmol/L (ref 5–14)
AST (SGOT): 19 U/L (ref <=34)
BILIRUBIN TOTAL: 0.2 mg/dL — ABNORMAL LOW (ref 0.3–1.2)
BLOOD UREA NITROGEN: 8 mg/dL — ABNORMAL LOW (ref 9–23)
BUN / CREAT RATIO: 9
CALCIUM: 7.7 mg/dL — ABNORMAL LOW (ref 8.7–10.4)
CHLORIDE: 120 mmol/L — ABNORMAL HIGH (ref 98–107)
CO2: 21 mmol/L (ref 20.0–31.0)
CREATININE: 0.89 mg/dL
EGFR CKD-EPI (2021) MALE: >90 mL/min/{1.73_m2} (ref >=60)
GLUCOSE RANDOM: 80 mg/dL (ref 70–179)
POTASSIUM: 3.9 mmol/L (ref 3.4–4.8)
PROTEIN TOTAL: 5.4 g/dL — ABNORMAL LOW (ref 5.7–8.2)
SODIUM: 147 mmol/L — ABNORMAL HIGH (ref 135–145)

## 2022-10-12 LAB — SEDIMENTATION RATE: ERYTHROCYTE SEDIMENTATION RATE: 13 mm/h (ref 0–15)

## 2022-10-12 LAB — C-REACTIVE PROTEIN: C-REACTIVE PROTEIN: 9 mg/L (ref <=10.0)

## 2022-10-12 LAB — LIPASE: LIPASE: 32 U/L (ref 12–53)

## 2022-10-12 MED ADMIN — metoclopramide (REGLAN) injection 10 mg: 10 mg | INTRAVENOUS | @ 11:00:00 | Stop: 2022-10-12

## 2022-10-12 MED ADMIN — sodium chloride 0.9% (NS) bolus 1,000 mL: 1000 mL | INTRAVENOUS | @ 11:00:00 | Stop: 2022-10-12

## 2022-10-12 NOTE — Unmapped (Signed)
Pt here with [facility] For abdominal pain. Hx of Chron's.  VSS

## 2022-10-12 NOTE — Unmapped (Signed)
Pt has RLQ pain that started 1 hr ago, had 2x episodes of emesis (greenish). Took Zofran, laid down, and then started having belly pain. VSS per EMS A&Ox4. Hx of COPD and abdominal hernia.

## 2022-10-12 NOTE — Unmapped (Signed)
[facility] Emergency Department Provider Note         ED Clinical Impression     Final diagnoses:   Abdominal pain, unspecified abdominal location (Primary)       Presenting History and MDM     HPI    October 12, 2022 6:35 AM   Jerome Mcclintic. is a 48 y.o. male who  has a past medical history of Anemia (04/05/2022), Anxiety, Avascular necrosis of femur head, right (CMS-HCC) (2011), Bipolar disorder (CMS-HCC), Cannabis use disorder, COPD (chronic obstructive pulmonary disease) (CMS-HCC), Crohn's disease (CMS-HCC), Depression (2007), DVT (deep venous thrombosis) (CMS-HCC), Myalgia, and SBO (small bowel obstruction) (CMS-HCC) (2011). presenting for evaluation of Donnell pain, nausea, vomiting, diarrhea.  Patient reports he has a history of Crohn's and this feels like a similar flare.  He reports started earlier today and they had no blood in his vomit or stool.  He reports no fevers or chills.  He reports no chest pain or shortness of breath.  Ports he had no pain in urination or blood in the urine.  Patient reports he has a history of a hernia repair but has not noticed any skin changes or pain in the area of the hernia repair.     Pertinent Chart Review: Patient was admitted in February for Crohn's flare.  Patient discharged on 10/01/2022 after reassuring hospital course.    Initial impression and pertinent physical exam:      BP 125/112  - Pulse 80  - Temp 36.9 ??C (98.4 ??F) (Oral)  - Resp 16  - Wt 70.3 kg (155 lb)  - SpO2 96%  - BMI 23.57 kg/m??     On initial evaluation patient is overall well-appearing no acute distress.  Patient with tenderness palpation diffusely throughout the abdomen with no rebound or guarding.  Cardiac respiratory exam unremarkable.  Evidence of volume overload or peripheral edema.     Diagnostic workup as below.     Orders Placed This Encounter   Procedures    CBC w/ Differential    Comprehensive metabolic panel    Lipase    Sedimentation rate, manual    C-reactive protein    ECG 12 Lead       Will reassess as we get results and update below         MDM: Given patient's history, presentation, physical exam, abdominal exam without peritoneal signs. No evidence of acute abdomen at this time. Well appearing. Given work up, low suspicion for acute hepatobiliary disease (including acute cholecystitis or cholangitis), acute pancreatitis (neg lipase), diverticulitis, PUD (including gastric perforation), acute infectious processes (pneumonia, hepatitis, pyelonephritis), acute appendicitis, vascular catastrophe, bowel obstruction, viscus perforation, or testicular torsion. Presentation not consistent with other acute, emergent causes of abdominal pain at this time, including those listed in the differential above.  Patient's inflammatory markers are reassuring which makes grossly less likely.  He was treated with Reglan here in the ED and reports significant improvement in his symptoms.  On reassessment patient's abdomen is a little tender.  Patient is requesting discharge home and I believe that his appropriate at this time.  Given patient's reassuring physical exam and work-up here in the emergency department, I have not identified any emergent condition requiring acute admission at this time.  The patient is stable for discharge home.  I discussed patient's ED course with patient as well as plan for discharge home.  Patient expresses agreement understanding with the plan.  Patient discharged home in stable condition with  instruction on outpatient follow-up and strict return precautions.        Discussion of Management with other Physicians, QHP, or Appropriate Source: Discussion with other professionals: None  Escalation of Care, Consideration of Admission/Observation/Transfer: Escalation of Care, Consideration of Admission/Observation/Transfer: However, patient was determined to be appropriate for outpatient management.  Diagnostic tests considered but not performed: Patient's physical exam with no evidence of acute abdomen.  No peritoneal signs.  Overall I have low suspicion for any emergent intra-abdominal pathology.  CT scan of the abdomen pelvis not indicated at this time.      _____________________________________________________________________    The case was discussed with attending physician who is in agreement with the above assessment and plan    Additional Medical Decision Making     I have reviewed the vital signs and the nursing notes. Labs and radiology results that were available during my care of the patient were independently reviewed by me and considered in my medical decision making.   I independently visualized the EKG tracing if performed  I independently visualized the radiology images if performed  I reviewed the patient's prior medical records if available.  Additional history obtained from family if available    Other History     CHIEF COMPLAINT:   Chief Complaint   Patient presents with    Abdominal Pain     RLQ       PAST MEDICAL HISTORY/PAST SURGICAL HISTORY:   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    Myalgia     SBO (small bowel obstruction) (CMS-HCC) 2011       Past Surgical History:   Procedure Laterality Date    CHOLECYSTECTOMY      COLON SURGERY      partial resection    HERNIA REPAIR      PR CLOSE ENTEROSTOMY,RESEC+COLOREC ANAS Midline 11/01/2019    Procedure: CLO ENTEROSTOMY; W/RESECT COLORECTAL ANASTOM;  Surgeon: Claretta Fraise, MD;  Location: MAIN OR [place];  Service: Gastrointestinal    PR COLONOSCOPY FLX DX W/COLLJ SPEC WHEN PFRMD Left 01/07/2013 Procedure: COLONOSCOPY, FLEXIBLE, PROXIMAL TO SPLENIC FLEXURE; DIAGNOSTIC, W/WO COLLECTION SPECIMEN BY BRUSH OR WASH;  Surgeon: Tish Men, MD;  [facility];  Service: Gastroenterology    PR COLONOSCOPY FLX DX W/COLLJ SPEC WHEN PFRMD  03/09/2015    Procedure: COLONOSCOPY, FLEXIBLE, PROXIMAL TO SPLENIC FLEXURE; DIAGNOSTIC, W/WO COLLECTION SPECIMEN BY BRUSH OR WASH;  Surgeon: Cletis Athens, MD;  Location: GI PROCEDURES [facility];  Service: Gastroenterology    PR COLONOSCOPY FLX DX W/COLLJ SPEC WHEN PFRMD N/A 10/28/2019    Procedure: COLONOSCOPY, FLEXIBLE, PROXIMAL TO SPLENIC FLEXURE; DIAGNOSTIC, W/WO COLLECTION SPECIMEN BY BRUSH OR WASH;  Surgeon: Beverly Milch, MD;  Location: GI PROCEDURES [facility];  Service: Gastroenterology    PR COLONOSCOPY FLX DX W/COLLJ SPEC WHEN PFRMD N/A 10/01/2022    Procedure: COLONOSCOPY, FLEXIBLE, PROXIMAL TO SPLENIC FLEXURE; DIAGNOSTIC, W/WO COLLECTION SPECIMEN BY BRUSH OR WASH;  Surgeon: Zetta Bills, MD;  Location:  GI PROCEDURES [facility];  Service: Gastroenterology    PR EXPLORATORY OF ABDOMEN Midline 09/02/2019    Procedure: EXPLORATORY LAPAROTOMY, EXPLORATORY CELIOTOMY WITH OR WITHOUT BIOPSY(S);  Surgeon: Claretta Fraise, MD;  Location: MAIN OR [facility];  Service: Gastrointestinal    PR EXPLORATORY OF ABDOMEN Midline 11/01/2019    Procedure: EXPLORATORY LAPAROTOMY, EXPLORATORY CELIOTOMY WITH OR WITHOUT BIOPSY(S);  Surgeon: Claretta Fraise, MD;  Location: MAIN OR [facility];  Service: Gastrointestinal    PR FIX FINGER,VOLAR PLATE,I-P JT Left 08/07/2022    Procedure: REPAIR AND RECONSTRUCTION, FINGER, VOLAR PLATE, INTERPHALANGEAL JOINT;  Surgeon: [name], MD;  Location: MAIN OR [facility];  Service: Orthopedics    PR FREEING BOWEL ADHESION,ENTEROLYSIS N/A 11/01/2019    Procedure: Enterolysis (Separt Proc);  Surgeon: Claretta Fraise, MD;  Location: MAIN OR [facility];  Service: Gastrointestinal    PR ILEOSCOPY THRU STOMA,BIOPSY N/A 10/14/2019 Procedure: Karren Cobble; [place] Saa 1/MX;  Surgeon: Neysa Hotter, MD;  Location: GI PROC[facility]URES [facility];  Service: Gastroenterology    PR PART REMOVAL COLON W COLOSTOMY Midline 09/02/2019    Procedure: COLECTOMY, PARTIAL; WITH SKIN LEVEL CECOSTOMY OR COLOSTOMY;  Surgeon: Claretta Fraise, MD;  Location: MAIN OR [facility];  Service: Gastrointestinal    PR REPAIR INCISIONAL HERNIA,REDUCIBLE Midline 09/02/2019    Procedure: REPAIR INIT INCISIONAL OR VENTRAL HERNIA; REDUCIBLE;  Surgeon: Kristopher Oppenheim, MD;  Location: MAIN OR [place];  Service: Trauma    PR REPAIR INTERCARP/CARP-METACARP JT Left 08/07/2022    Procedure: Jorene Guest;  Surgeon: [name], MD;  Location: MAIN OR [facility];  Service: Orthopedics    PR UPPER GI ENDOSCOPY,BIOPSY N/A 01/07/2013    Procedure: UGI ENDOSCOPY; WITH BIOPSY, SINGLE OR MULTIPLE;  Surgeon: Tish Men, MD;  Location: GI [facility]URES [facility];  Service: Gastroenterology    PR UPPER GI ENDOSCOPY,BIOPSY N/A 10/14/2019    Procedure: UGI ENDOSCOPY; WITH BIOPSY, SINGLE OR MULTIPLE;  Surgeon: Neysa Hotter, MD;  Location: GI [facility]URES [facility];  Service: Gastroenterology    PR UPPER GI ENDOSCOPY,DIAGNOSIS N/A 10/01/2022    Procedure: UGI ENDO, INCLUDE ESOPHAGUS, STOMACH, & DUODENUM &/OR JEJUNUM; DX W/WO COLLECTION SPECIMN, BY BRUSH OR WASH;  Surgeon: Zetta Bills, MD;  Location: GI PROC[facility]URES [facility];  Service: Gastroenterology       MEDICATIONS:   No current facility-administered medications for this encounter.    Current Outpatient Medications:     ADALIMUMAB PEN CITRATE FREE 40 MG/0.4 ML, Inject the contents of 1 pen (40 mg total) under the skin every seven (7) days., Disp: 4 each, Rfl: 5    ascorbic acid, vitamin C, (VITAMIN C) 250 MG tablet, Take 1 tablet (250 mg total) by mouth every other day. Take with oral iron, Disp: 30 tablet, Rfl: 11    B complex vitamins (B COMPLEX-VITAMIN B12) tablet, Take 1 tablet by mouth daily., Disp: 30 tablet, Rfl: 11    budesonide-formoterol (SYMBICORT) 80-4.5 mcg/actuation inhaler, Inhale 2 puffs two (2) times a day., Disp: , Rfl:     calcium carbonate (TUMS) 200 mg calcium (500 mg) chewable tablet, Chew 2 tablets (400 mg of elem calcium total) two (2) times a day., Disp: 60 tablet, Rfl: 0    cholecalciferol, vitamin D3 25 mcg, 1,000 units,, 1,000 unit (25 mcg) tablet, Take 1 tablet (25 mcg total) by mouth daily., Disp: 30 tablet, Rfl: 0    empty container Misc, Use as directed to dispose of Humira, Disp: 1 each, Rfl: 3    ferrous sulfate 325 (65 FE) MG  tablet, Take 1 tablet (325 mg total) by mouth every other day., Disp: , Rfl: 0    fluticasone propionate (FLONASE) 50 mcg/actuation nasal spray, 1 spray into each nostril every morning., Disp: , Rfl:     magnesium oxide (MAG-OX) 400 mg (241.3 mg elemental magnesium) tablet, Take 1 tablet (400 mg total) by mouth two (2) times a day., Disp: 60 tablet, Rfl: 0    nicotine (NICODERM CQ) 21 mg/24 hr patch, Place 1 patch on the skin daily., Disp: 28 patch, Rfl: 0    pantoprazole (PROTONIX) 40 MG tablet, Take 1 tablet (40 mg total) by mouth daily., Disp: , Rfl:     QUEtiapine (SEROQUEL) 100 MG tablet, Take 1 tablet (100 mg total) by mouth nightly., Disp: , Rfl:     ALLERGIES:   Morphine and Toradol [ketorolac]    SOCIAL HISTORY:   Social History     Tobacco Use    Smoking status: Former     Current packs/day: 0.00     Types: Cigarettes     Quit date: 10/02/2001     Years since quitting: 21.0    Smokeless tobacco: Never    Tobacco comments:     Motivated to quit. Smokes 0.5ppd but relights each cig   Substance Use Topics    Alcohol use: No     Alcohol/week: 0.0 standard drinks of alcohol       FAMILY HISTORY:  Family History   Problem Relation Age of Onset    Diabetes Maternal Aunt     Crohn's disease Neg Hx           Review of Systems    A 10 point review of systems was performed and is negative other than positive elements noted in HPI     Physical Exam GENERAL APPEARANCE:  AxOx4, generally well-appearing, no acute distress.  HEENT:  [place], AT. MMM. EOMI, clear conjunctiva, oropharynx clear.  NECK:  Supple without lymphadenopathy.  No stiffness or restricted ROM.  HEART:  Rate as above, normal S1/S1, no m/r/g  LUNGS:  CTAB, moving air well. No crackles or wheezes are heard.  ABDOMEN:  Patient with tenderness palpation diffusely throughout the abdomen with no rebound or guarding.   BACK: No CVAT, no obvious deformity.  EXTREMITIES:  Without cyanosis, clubbing or edema.  NEUROLOGICAL:  Grossly nonfocal. Moving all 4 extremities. CN tested and intact. Observed to ambulate with normal gait.  Skin:  Warm and dry without any rash.    Radiology     No orders to display       Labs     Labs Reviewed   COMPREHENSIVE METABOLIC PANEL - Abnormal; Notable for the following components:       Result Value    Sodium 147 (*)     Chloride 120 (*)     BUN 8 (*)     Calcium 7.7 (*)     Albumin 2.3 (*)     Total Protein 5.4 (*)     Total Bilirubin 0.2 (*)     All other components within normal limits   CBC W/ AUTO DIFF - Abnormal; Notable for the following components:    RBC 4.08 (*)     HGB 12.2 (*)     HCT 37.2 (*)     RDW 16.8 (*)     Anisocytosis Slight (*)     All other components within normal limits   LIPASE - Normal   SEDIMENTATION RATE - Normal   C-REACTIVE PROTEIN -  Normal   CBC W/ DIFFERENTIAL    Narrative:     The following orders were created for panel order CBC w/ Differential.                  Procedure                               Abnormality         Status                                     ---------                               -----------         ------                                     CBC w/ [id number]]         Abnormal            Final result                                                 Please view results for these tests on the individual orders.       Please note- This chart has been created using [employer]. Chart creation errors have been sought, but may not always be located and such creation errors, especially pronoun confusion, do NOT reflect on the standard of medical care.     Aquilla Solian, MD  Resident  10/12/22 [phone]

## 2022-10-13 NOTE — Unmapped (Signed)
Returned a call to Mr Seck to confirm his appointment time and location. Triage completed

## 2022-10-17 ENCOUNTER — Ambulatory Visit: Admit: 2022-10-17 | Discharge: 2022-10-18 | Payer: MEDICAID

## 2022-10-17 MED ORDER — CYANOCOBALAMIN (VIT B-12) 1,000 MCG SUBLINGUAL TABLET
ORAL_TABLET | Freq: Every day | SUBLINGUAL | 3 refills | 0 days | Status: CP
Start: 2022-10-17 — End: 2023-10-17

## 2022-10-17 NOTE — Unmapped (Signed)
[facility] at [facility]  Initial Consultation Visit       Reason for Visit: Inflammatory Bowel Disease  Referring Provider: Jeanann Lewandowsky   Primary Care Provider: Pcp, None Per Patient    Assessment & Plan: Jerome Kennedy is a 47 y.o. 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    Currently on humira weekly since Dec 2023, increased from q2 weeks. Prior to this he reportedly failed Remicade (had been following with Dr. Netta Neat at [facility] in [place]). Patient in clinical remission and endoscopic remission (last colonoscopy 09/2022 showed no evidence of active disease with SES-CD 0). He has had multiple recent ED presentations for abdominal pain, suspect this functional in nature.     However if his Crohn's disease were to progress would plan to switch him to Stelara with prednisone taper until it can take effect (which could take 8-12 weeks).     Recommendations  - Continue weekly Humira   - s/p vit B12 injection - start PO repletion 2000 mcg daily   - continue PO iron   - advised pt to call me if recurrent abdominal pain, if I do not have any openings can arrange for him to be seen in GI urgent care - the goal would be to keep him out of the ED   - advised pt to send me his new address so that we can coordinate his Humira delivery     Chauncy Passy MD   Gastroenterology Fellow  Pager: [phone]    Patient seen and examined with Dr. Anne Shutter who is in agreement with above assessment and plan.     Return in about 6 months (around 04/19/2023).         Subjective   HPI: Jerome Personius. is a 47 y.o. 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     Patient was admitted to [facility] Sept 2023 for RLQ pain and diarrhea, initially started on steroids due to concern for CD flare however found to have uncomplicated C diff. His steroids were stopped and he was treated with PO vancomycin QID with resolution. He reports he is now having two formed bowel movements daily with no blood.  He does however admit to abdominal pain, infraumbilical, a few days before his next Humira dose.  He is on Humira q. 14 days reports that abdominal pains tends to start around day 10, and resolves after his Humira dose.  He also reveals he will be leaving the men shelter at [facility] where he is currently staying.  Thinks he will be moving to [place] in January.  Inquires about IBD providers in [place].    CLINICAL  SUMMARY:  - 06/10/22 initial visit, continue humira, check fCal   - 07/2022: inpatient admission for outpatient labs showing hypocalcemia, CT with long segment circumferential bowel wall thickening and hyperenhancement of the neoterminal ileum extending through the anastomosis of the proximal colon which was increased from prior CT scan along with luminal narrowing of this long segment of terminal ileum with focal proximal dilation concerning for stricture. Continue weekly humira (had been increased 1 week prior to hospital admission).   - 09/2022: admitted for RLQ pain and loose stools, EGD normal, colonoscopy with SES CD of 0 though poor prep, continue weekly humira    SUBJECTIVE:   No pain today, BM twice daily every other day, consistency is mashed potato, appetite is good. He is moving out of men's shelter and into his own place in [place] in Mid-April.     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;  Service: Gastrointestinal    PR COLONOSCOPY FLX DX W/COLLJ SPEC WHEN PFRMD Left 01/07/2013    Procedure: COLONOSCOPY, FLEXIBLE, PROXIMAL TO SPLENIC FLEXURE; DIAGNOSTIC, W/WO COLLECTION SPECIMEN BY BRUSH OR WASH;  Surgeon: Tish Men, MD;  [facility];  Service: Gastroenterology    PR COLONOSCOPY FLX DX W/COLLJ SPEC WHEN PFRMD  03/09/2015    Procedure: COLONOSCOPY, FLEXIBLE, PROXIMAL TO SPLENIC FLEXURE; DIAGNOSTIC, W/WO COLLECTION SPECIMEN BY BRUSH OR WASH;  Surgeon: Cletis Athens, MD;  Location: GI PROCEDURES [facility];  Service: Gastroenterology    PR COLONOSCOPY FLX DX W/COLLJ SPEC WHEN PFRMD N/A 10/28/2019    Procedure: COLONOSCOPY, FLEXIBLE, PROXIMAL TO SPLENIC FLEXURE; DIAGNOSTIC, W/WO COLLECTION SPECIMEN BY BRUSH OR WASH;  Surgeon: Beverly Milch, MD;  Location: GI PROCEDURES [facility];  Service: Gastroenterology    PR COLONOSCOPY FLX DX W/COLLJ SPEC WHEN PFRMD N/A 10/01/2022    Procedure: COLONOSCOPY, FLEXIBLE, PROXIMAL TO SPLENIC FLEXURE; DIAGNOSTIC, W/WO COLLECTION SPECIMEN BY BRUSH OR WASH;  Surgeon: Zetta Bills, MD;  Location: GI PROCEDURES [facility];  Service: Gastroenterology    PR EXPLORATORY OF ABDOMEN Midline 09/02/2019    Procedure: EXPLORATORY LAPAROTOMY, EXPLORATORY CELIOTOMY WITH OR WITHOUT BIOPSY(S);  Surgeon: Claretta Fraise, MD;  Location: MAIN OR [facility];  Service: Gastrointestinal    PR EXPLORATORY OF ABDOMEN Midline 11/01/2019    Procedure: EXPLORATORY LAPAROTOMY, EXPLORATORY CELIOTOMY WITH OR WITHOUT BIOPSY(S);  Surgeon: Claretta Fraise, MD;  Location: MAIN OR [facility];  Service: Gastrointestinal    PR FIX FINGER,VOLAR PLATE,I-P JT Left 08/07/2022    Procedure: REPAIR  AND RECONSTRUCTION, FINGER, VOLAR PLATE, INTERPHALANGEAL JOINT;  Surgeon: [name], MD;  Location: MAIN OR [facility];  Service: Orthopedics    PR FREEING BOWEL ADHESION,ENTEROLYSIS N/A 11/01/2019    Procedure: Enterolysis (Separt Proc);  Surgeon: Claretta Fraise, MD;  Location: MAIN OR [facility];  Service: Gastrointestinal    PR ILEOSCOPY THRU STOMA,BIOPSY N/A 10/14/2019    Procedure: Karren Cobble; [place] Saa 1/MX;  Surgeon: Neysa Hotter, MD;  Location: GI PROCEDURES [facility];  Service: Gastroenterology    PR PART REMOVAL COLON W COLOSTOMY Midline 09/02/2019    Procedure: COLECTOMY, PARTIAL; WITH SKIN LEVEL CECOSTOMY OR COLOSTOMY;  Surgeon: Claretta Fraise, MD;  Location: MAIN OR [facility];  Service: Gastrointestinal    PR REPAIR INCISIONAL HERNIA,REDUCIBLE Midline 09/02/2019    Procedure: REPAIR INIT INCISIONAL OR VENTRAL HERNIA; REDUCIBLE;  Surgeon: Kristopher Oppenheim, MD;  Location: MAIN OR [place];  Service: Trauma    PR REPAIR INTERCARP/CARP-METACARP JT Left 08/07/2022    Procedure: Jorene Guest;  Surgeon: [name], MD;  Location: MAIN OR [facility];  Service: Orthopedics    PR UPPER GI ENDOSCOPY,BIOPSY N/A 01/07/2013    Procedure: UGI ENDOSCOPY; WITH BIOPSY, SINGLE OR MULTIPLE;  Surgeon: Tish Men, MD;  Location: GI PROCEDURES [facility];  Service: Gastroenterology    PR UPPER GI ENDOSCOPY,BIOPSY N/A 10/14/2019    Procedure: UGI ENDOSCOPY; WITH BIOPSY, SINGLE OR MULTIPLE;  Surgeon: Neysa Hotter, MD;  Location: GI PROCEDURES [facility];  Service: Gastroenterology    PR UPPER GI ENDOSCOPY,DIAGNOSIS N/A 10/01/2022    Procedure: UGI ENDO, INCLUDE ESOPHAGUS, STOMACH, & DUODENUM &/OR JEJUNUM; DX W/WO COLLECTION SPECIMN, BY BRUSH OR WASH;  Surgeon: Zetta Bills, MD;  Location: GI PROCEDURES [facility];  Service: Gastroenterology       Family History:  Family History   Problem Relation Age of Onset    Diabetes Maternal Aunt     Crohn's disease Neg Hx        Social History:  Social History     Tobacco Use    Smoking status: Former     Current packs/day: 0.00     Types: Cigarettes     Quit date: 10/02/2001     Years since quitting: 21.1    Smokeless tobacco: Never    Tobacco comments:     Motivated to quit. Smokes 0.5ppd but relights each cig   Vaping Use    Vaping status: Never Used   Substance Use Topics    Alcohol use: No     Alcohol/week: 0.0 standard drinks of alcohol    Drug use: Yes     Types: Marijuana     Comment: Denies currently, once a month       Medications:   Prior to Admission medications    Medication Dose, Route, Frequency   ADALIMUMAB PEN CITRATE FREE 40 MG/0.4 ML Inject the contents of 1 pen (40 mg total) under the skin every seven (7) days.   ascorbic acid, vitamin C, (VITAMIN C) 250 MG tablet 250 mg, Oral, Every other day, Take with oral iron   budesonide-formoterol (SYMBICORT) 80-4.5 mcg/actuation inhaler 2 puffs, Inhalation, 2 times a day PRN   calcium carbonate (TUMS) 200 mg calcium (500 mg) chewable tablet 2 tablets, Oral, 2 times a day (standard)   cholecalciferol, vitamin D3 25 mcg, 1,000 units,, 1,000 unit (25 mcg) tablet 25 mcg, Oral, Daily (standard)   empty container Misc Use as directed to dispose of Humira   ferrous sulfate 325 (65 FE) MG tablet 325  mg, Oral, Every other day   fluticasone propionate (FLONASE) 50 mcg/actuation nasal spray 2 sprays, Each Nare, Daily PRN   nicotine (NICODERM CQ) 21 mg/24 hr patch 1 patch, Transdermal, Daily (standard)   OPTICHAMBER DIAMOND VHC Spcr    pantoprazole (PROTONIX) 40 MG tablet 40 mg, Oral, Daily (standard)   QUEtiapine (SEROQUEL) 100 MG tablet Take 1 tablet (100 mg total) by mouth nightly.   albuterol HFA 90 mcg/actuation inhaler 2 puffs, Inhalation, 4 times a day   cyanocobalamin, vitamin B-12, 1,000 mcg Subl 2,000 mcg, Sublingual, Daily   oxyCODONE (ROXICODONE) 5 MG immediate release tablet 5 mg, Oral, Every 6 hours PRN   promethazine (PHENERGAN) 12.5 MG tablet 12.5 mg, Oral, Every 6 hours PRN       Allergies:  Morphine and Toradol [ketorolac]    Review of Systems:  10 systems were reviewed and are negative unless otherwise mentioned in the HPI            Objective   Vital Signs:  Vitals:    10/17/22 1050   BP: 104/68   BP Site: L Arm   BP Position: Sitting   BP Cuff Size: Medium   Pulse: 80   Weight: 63.2 kg (139 lb 4.8 oz)   Height: 172.7 cm (5' 8)      Body mass index is 21.18 kg/m??.    Wt Readings from Last 12 Encounters:   10/28/22 63.5 kg (140 lb)   10/17/22 63.2 kg (139 lb 4.8 oz)   10/12/22 70.3 kg (155 lb)   10/01/22 68 kg (150 lb)   09/17/22 61.2 kg (135 lb)   09/02/22 61.2 kg (135 lb)   08/07/22 61.4 kg (135 lb 5.8 oz)   07/25/22 66 kg (145 lb 8 oz)   06/20/22 62 kg (136 lb 11.2 oz)   04/05/22 62.3 kg (137 lb 5.6 oz)   12/17/20 59.4 kg (130 lb 15.3 oz)   12/09/19 60.6 kg (133 lb 8 oz)       Physical Exam:   Gen: NAD, answers questions appropriately  Eyes: Sclera anicteric, EOMI  HENT: atraumatic, normocephalic, MMM. OP w/o erythema or exudate   Neck: no cervical lymphadenopathy or thyromegaly, no JVD  Heart: RRR no m/r/g  Lungs: CTAB no r/r/w  Abdomen: Normoactive bowel sounds, soft, NT/ND, no rebound/guarding, no HSM  Extremities: no c/c/e, pulses +2 in b/l UE/LEs  Neuro: CN II-XI grossly intact, normal cerebellar function, normal gait. No focal deficits.  Skin:  No rashes, lesions on clothed exam  Psych: Alert and oriented, normal mood and affect.          GI Imaging and Procedures:     EGD/Colonoscopy 10/01/22:   Findings:       A small hiatal hernia was present.       The exam of the esophagus was otherwise normal.       The entire examined stomach was normal.       The examined duodenum was normal.       There was no evidence of Crohn's disease seen on the EGD.  Impression:            - Small hiatal hernia.                         - Normal stomach.                         - Normal examined duodenum.                         - No specimens collected.    Findings:       A large amount of semi-liquid semi-solid stool was found in the entire        colon, precluding visualization. This made the endoscopy challenging and        the procedure had to be aborted because the ileocolonic anastomosis was        reached.       The Simple Endoscopic Score for Crohn's Disease was determined based on        the endoscopic appearance of the mucosa in the following segments:        Segment score: 0. Segment score: 0. Segment score: 0.       - Left Colon: Based on visualized mucosa, findings include no ulcers        present, no ulcerated surfaces, no affected surfaces and no narrowings.        Segment score: 0.       - Rectum: Based on visualized mucos, findings include no ulcers present,        no ulcerated surfaces, no affected surfaces and no narrowings. Segment        score: 0.       - Total SES-CD aggregate score: 0.                                                                                   Impression:            - Stool in the entire examined colon. - Simple Endoscopic Score for Crohn's Disease: 0,                          mucosal inflammatory changes secondary to Crohn's                          disease, in remission.                         - No specimens collected.    Colonoscopy 09/06/21:

## 2022-10-17 NOTE — Unmapped (Addendum)
Call Dr. Marigene Ehlers nurse Noe Gens p: [phone] if you have any issues getting your humira, if you move (we will need your new address), and if you have any symptoms.   Start vitamin B12 - dissolve two tablets under the tongue every day.   Stop your magnesium

## 2022-10-28 ENCOUNTER — Ambulatory Visit: Admit: 2022-10-28 | Discharge: 2022-10-29 | Disposition: A | Discharge status: Home / Self Care | Payer: MEDICAID

## 2022-10-28 LAB — CBC W/ AUTO DIFF
BASOPHILS ABSOLUTE COUNT: 0.1 10*9/L (ref 0.0–0.1)
BASOPHILS RELATIVE PERCENT: 1 %
EOSINOPHILS ABSOLUTE COUNT: 0.2 10*9/L (ref 0.0–0.5)
EOSINOPHILS RELATIVE PERCENT: 3.1 %
HEMATOCRIT: 37.8 % — ABNORMAL LOW (ref 39.0–48.0)
HEMOGLOBIN: 12.4 g/dL — ABNORMAL LOW (ref 12.9–16.5)
LYMPHOCYTES ABSOLUTE COUNT: 2.1 10*9/L (ref 1.1–3.6)
LYMPHOCYTES RELATIVE PERCENT: 39.7 %
MEAN CORPUSCULAR HEMOGLOBIN CONC: 32.7 g/dL (ref 32.0–36.0)
MEAN CORPUSCULAR HEMOGLOBIN: 28.3 pg (ref 25.9–32.4)
MEAN CORPUSCULAR VOLUME: 86.7 fL (ref 77.6–95.7)
MEAN PLATELET VOLUME: 7.7 fL (ref 6.8–10.7)
MONOCYTES ABSOLUTE COUNT: 0.4 10*9/L (ref 0.3–0.8)
MONOCYTES RELATIVE PERCENT: 7 %
NEUTROPHILS ABSOLUTE COUNT: 2.6 10*9/L (ref 1.8–7.8)
NEUTROPHILS RELATIVE PERCENT: 49.2 %
PLATELET COUNT: 252 10*9/L (ref 150–450)
RED BLOOD CELL COUNT: 4.37 10*12/L (ref 4.26–5.60)
RED CELL DISTRIBUTION WIDTH: 15.8 % — ABNORMAL HIGH (ref 12.2–15.2)
WBC ADJUSTED: 5.2 10*9/L (ref 3.6–11.2)

## 2022-10-28 LAB — MAGNESIUM: MAGNESIUM: 1.6 mg/dL (ref 1.6–2.6)

## 2022-10-28 LAB — URINALYSIS WITH MICROSCOPY WITH CULTURE REFLEX
BACTERIA: NONE SEEN /HPF
BILIRUBIN UA: NEGATIVE
BLOOD UA: NEGATIVE
GLUCOSE UA: NEGATIVE
KETONES UA: NEGATIVE
LEUKOCYTE ESTERASE UA: NEGATIVE
NITRITE UA: NEGATIVE
PH UA: 6.5 (ref 5.0–9.0)
PROTEIN UA: NEGATIVE
RBC UA: 1 /HPF (ref <=3)
SPECIFIC GRAVITY UA: 1.016 (ref 1.003–1.030)
SQUAMOUS EPITHELIAL: 1 /HPF (ref 0–5)
UROBILINOGEN UA: <2
WBC UA: <1 /HPF (ref <=2)

## 2022-10-28 LAB — COMPREHENSIVE METABOLIC PANEL
ALBUMIN: 2.4 g/dL — ABNORMAL LOW (ref 3.4–5.0)
ALKALINE PHOSPHATASE: 125 U/L — ABNORMAL HIGH (ref 46–116)
ALT (SGPT): 11 U/L (ref 10–49)
ANION GAP: 5 mmol/L (ref 5–14)
AST (SGOT): 17 U/L (ref <=34)
BILIRUBIN TOTAL: 0.2 mg/dL — ABNORMAL LOW (ref 0.3–1.2)
BLOOD UREA NITROGEN: 9 mg/dL (ref 9–23)
BUN / CREAT RATIO: 10
CALCIUM: 8 mg/dL — ABNORMAL LOW (ref 8.7–10.4)
CHLORIDE: 112 mmol/L — ABNORMAL HIGH (ref 98–107)
CO2: 26 mmol/L (ref 20.0–31.0)
CREATININE: 0.89 mg/dL
EGFR CKD-EPI (2021) MALE: >90 mL/min/{1.73_m2} (ref >=60)
GLUCOSE RANDOM: 84 mg/dL (ref 70–179)
POTASSIUM: 3.7 mmol/L (ref 3.4–4.8)
PROTEIN TOTAL: 5.4 g/dL — ABNORMAL LOW (ref 5.7–8.2)
SODIUM: 143 mmol/L (ref 135–145)

## 2022-10-28 LAB — IRON: IRON: 53 ug/dL — ABNORMAL LOW

## 2022-10-28 LAB — IRON PANEL: TOTAL IRON BINDING CAPACITY: 244 ug/dL — ABNORMAL LOW (ref 250–425)

## 2022-10-28 LAB — FERRITIN: FERRITIN: 54.6 ng/mL

## 2022-10-28 LAB — SEDIMENTATION RATE: ERYTHROCYTE SEDIMENTATION RATE: 14 mm/h (ref 0–15)

## 2022-10-28 LAB — C-REACTIVE PROTEIN: C-REACTIVE PROTEIN: 8 mg/L (ref <=10.0)

## 2022-10-28 LAB — LIPASE: LIPASE: 42 U/L (ref 12–53)

## 2022-10-28 MED ADMIN — promethazine (PHENERGAN) injection 12.5 mg: 12.5 mg | INTRAMUSCULAR | @ 06:00:00

## 2022-10-28 MED ADMIN — promethazine (PHENERGAN) tablet 12.5 mg: 12.5 mg | ORAL | @ 13:00:00

## 2022-10-28 MED ADMIN — lactated Ringers infusion: 75 mL/h | INTRAVENOUS | @ 16:00:00 | Stop: 2022-10-29

## 2022-10-28 MED ADMIN — cholecalciferol (vitamin D3 25 mcg (1,000 units)) tablet 25 mcg: 25 ug | ORAL | @ 16:00:00

## 2022-10-28 MED ADMIN — promethazine (PHENERGAN) tablet 12.5 mg: 12.5 mg | ORAL | @ 23:00:00

## 2022-10-28 MED ADMIN — oxyCODONE (ROXICODONE) immediate release tablet 5 mg: 5 mg | ORAL | @ 16:00:00 | Stop: 2022-11-11

## 2022-10-28 MED ADMIN — multivitamin with folic acid 400 mcg tablet 1 tablet: 1 | ORAL | @ 16:00:00

## 2022-10-28 MED ADMIN — oxyCODONE (ROXICODONE) immediate release tablet 5 mg: 5 mg | ORAL | @ 23:00:00 | Stop: 2022-11-11

## 2022-10-28 MED ADMIN — lactated ringers bolus 1,000 mL: 1000 mL | INTRAVENOUS | @ 06:00:00 | Stop: 2022-10-28

## 2022-10-28 MED ADMIN — promethazine (PHENERGAN) tablet 12.5 mg: 12.5 mg | ORAL | @ 06:00:00

## 2022-10-28 MED ADMIN — HYDROmorphone (PF) injection Syrg 0.5 mg: 0.5 mg | INTRAVENOUS | @ 08:00:00 | Stop: 2022-10-28

## 2022-10-28 MED ADMIN — haloperidol LACTATE (HALDOL) injection 2 mg: 2 mg | INTRAVENOUS | @ 09:00:00 | Stop: 2022-10-28

## 2022-10-28 MED ADMIN — calcium carbonate (TUMS) chewable tablet 400 mg of elem calcium: 400 mg | ORAL | @ 16:00:00

## 2022-10-28 MED ADMIN — ferrous sulfate tablet 325 mg: 325 mg | ORAL | @ 16:00:00

## 2022-10-28 MED ADMIN — fluticasone furoate-vilanterol (BREO ELLIPTA) 100-25 mcg/dose inhaler 1 puff: 1 | RESPIRATORY_TRACT | @ 16:00:00

## 2022-10-28 MED ADMIN — nicotine (NICODERM CQ) 21 mg/24 hr patch 1 patch: 1 | TRANSDERMAL | @ 16:00:00

## 2022-10-28 MED ADMIN — HYDROmorphone (PF) injection Syrg 0.5 mg: 0.5 mg | INTRAVENOUS | @ 06:00:00 | Stop: 2022-10-28

## 2022-10-28 MED ADMIN — fluticasone propionate (FLONASE) 50 mcg/actuation nasal spray 1 spray: 1 | NASAL | @ 16:00:00

## 2022-10-28 NOTE — Unmapped (Signed)
ED Progress Note    Jerome Kennedy. is a 47 y.o. male who  has a past medical history of Anemia (04/05/2022), Anxiety, Avascular necrosis of femur head, right (CMS-HCC) (2011), Bipolar disorder (CMS-HCC), Cannabis use disorder, COPD (chronic obstructive pulmonary disease) (CMS-HCC), Crohn's disease (CMS-HCC), Depression (2007), DVT (deep venous thrombosis) (CMS-HCC), Myalgia, and SBO (small bowel obstruction) (CMS-HCC) (2011). who presented to [facility] on 10/28/2022 with abdominal pain.     Received signout from overnight resident at 07:00.   - Concern for Crohn's flare   - Moving to team D  - planning for admit     ________________________________  Alvino Chapel, MD   October 28, 2022 8:05 AM

## 2022-10-28 NOTE — Unmapped (Cosign Needed)
Hospital Medicine [facility]) History & Physical    Assessment & Plan:   Jerome Kennedy. is a 47 y.o. male whose presentation is complicated by Crohn's Disease on Humira, IDA, hx of RUE DVT, COPD, Bipolar disorder, and unstable housing who presented to 3 different ED over 48 hours with abdominal pain, vomiting, and diarrhea consistent with Crohn's flare.     Principal Problem:    Exacerbation of Crohn's disease (CMS-HCC)  Active Problems:    Crohn's disease (CMS-HCC)    Abdominal pain    Nausea & vomiting    Hypoalbuminemia    Iron deficiency anemia    Bipolar 1 disorder (CMS-HCC)    COPD (chronic obstructive pulmonary disease) (CMS-HCC)    Homelessness    Hx of deep venous thrombosis  Resolved Problems:    * No resolved hospital problems. *    Active Problems    RLQ Pain, Vomiting, Diarrhea - C/f Acute Exacerbation of Crohn's Disease  Clinical symptoms consistent with his previous Crohn's flare. Imaging findings are nonspecific, but could represent enterocolitis for Crohn's. No infectious symptoms to warrant antibiotics. Given his frequent ED visits over the past 48 hours, will admit for IV pain meds, antiemetics, and fluids. I spoke to our GI colleagues regarding his case and they agree with our plan. There are no plans to change his outpatient Humira regimen at this time as he needs to consistently show up to his outpatient appointments to be considered for alternative biologic therapy.   - clear liquid diet + IVF --> advance diet as tolerated  - IV dilaudid for 12-24 hours --> transition to oral  - IV/PO phenergan  - GIPP and C. Dif assay given hx of recurrent C. Dif  - anticipate DC once he is able to tolerate oral intake    Chronic Problems    Iron Deficiency Anemia  - repeat iron studies  - home oral iron    Hx of RUE DVT  Diagnosed ~2011 with RUE DVT. He was previously on full dose anticoagulation, however this was stopped per Heme recs due to GIB.   - DVT ppx during admission    Bipolar Disorder  - home Seroquel    COPD  -hospital formulary substitute for home inhalers    Tobacco Use Disorder  -nicotine patch    Hypoalbuminemia  - nutrition consult      The patient's presentation is complicated by the following clinically significant conditions requiring additional evaluation and treatment: - Malnutrition POA requiring further investigation, treatment, or monitoring  - Hypercoagulable state requiring additional attention to DVT prophylaxis and treatment       Checklist:  Diet: Clear Liquid Diet  DVT PPx: Lovenox 40mg  q24h  Code Status: Full Code  Dispo: Patient appropriate for floor      Chief Concern:   Exacerbation of Crohn's disease (CMS-HCC)      Subjective:     History obtained by patient.       HPI:  Hx of Crohn's disease in small and large intestine s/p ileocecectomy, right hemicolectomy, with multiple complications including hernias, short gut syndrome, SBO, and recurrent CDI. He also has frequent flares of his abdominal pain. He has had 9 ED visits over past 2 months due to abdominal pain, nausea, and vomiting.     He was seen at [facility] GI 3/15 and was doing well - regular Bms without blood, improvement in pain. He reports that his pain returns prior to Humira dose and then subsides after taking Humira.  He presented to [facility] 3/24 with RLQ, emesis and hematochezia. Workup was unremarkable. He received IV dilaudid and IV phenergan. He was verbally abusive to staff and escorted out.     Then presented to [facility] 3/25 because his symptoms returned a few hours after leaving [facility] ED. He again received IV dilaudid and phenergan. They did not feel he required admission, so he was discharged.    He felt better for a few hours and ate a piece of pizza. His pain immediately returned and he vomiting multiple times, prompting presentation to [facility] ED. On my questioning, he denied any blood in stool or vomit as well as fevers.     WBC, ESR, and CRP wnl at [facility] ED. On initial presentation to [facility], his CRP was very mildly elevated. VSS, afebrile.     Per our radiologists review of his images obtained at [facility]:  Prior ileocecectomy and right hemicolectomy with lower abdominal ileocolonic anastomosis; wall thickening and mucosal enhancement of the neoterminal ileum and sigmoid colon compatible with enteritis/colitis from inflammatory versus infectious etiology.      --There is narrowing/thickening of the ileocolonic anastomosis similar to prior studies likely reflecting stricturing. Additionally, mild upstream dilation of the terminal ileum containing fecalized stool suggestive of ileus without evidence of obstruction.         Pertinent Social Hx   On disability due to IBD. Unstable housing. Currently living in shelter in [place]. He has his own place in [place] that he will move into within the next month. Smokes 1/2 pack cigarettes a day. Smokes marijuana twice a day. No alcohol. No other drugs.     Allergies  Morphine and Toradol [ketorolac]      Prior to Admission medications    Medication Dose, Route, Frequency   ADALIMUMAB PEN CITRATE FREE 40 MG/0.4 ML Inject the contents of 1 pen (40 mg total) under the skin every seven (7) days.   QUEtiapine (SEROQUEL) 100 MG tablet Take 1 tablet (100 mg total) by mouth nightly.   ascorbic acid, vitamin C, (VITAMIN C) 250 MG tablet 250 mg, Oral, Every other day, Take with oral iron   budesonide-formoterol (SYMBICORT) 80-4.5 mcg/actuation inhaler 2 puffs, Inhalation, 2 times a day (standard)   calcium carbonate (TUMS) 200 mg calcium (500 mg) chewable tablet 2 tablets, Oral, 2 times a day (standard)   cholecalciferol, vitamin D3 25 mcg, 1,000 units,, 1,000 unit (25 mcg) tablet 25 mcg, Oral, Daily (standard)   cyanocobalamin, vitamin B-12, 1,000 mcg Subl 2,000 mcg, Sublingual, Daily   empty container Misc Use as directed to dispose of Humira   ferrous sulfate 325 (65 FE) MG tablet 325 mg, Oral, Every other day   fluticasone propionate (FLONASE) 50 mcg/actuation nasal spray 1 spray, Each Nare, Every morning   nicotine (NICODERM CQ) 21 mg/24 hr patch 1 patch, Transdermal, Daily (standard)   OPTICHAMBER DIAMOND VHC Spcr    pantoprazole (PROTONIX) 40 MG tablet 40 mg, Oral, Daily (standard)       Designated Healthcare Decision Maker:  Cheryle Horsfall - brother    Objective:   Physical Exam:  Temp:  [36.8 ??C (98.2 ??F)] 36.8 ??C (98.2 ??F)  Heart Rate:  [82-96] 82  SpO2 Pulse:  [84] 84  Resp:  [18-20] 18  BP: (105-133)/(72-82) 105/72  SpO2:  [93 %-100 %] 96 %    General Appearance: NAD, resting comfortably in bed  CV: normal rate, regular rhythm, no murmurs. 2+ radial pulses.  Resp: lungs clear at apices, no increased  WOB, able to speak in full sentences, no use of accessory muscles  GI: Soft, mild TTP in RLQ, non distended. no rebound or guarding. Hypoactive BS. Large reducible, non tender ventral hernia.  Skin: No rashes or lesions. No diaphoresis. Skin warm and well perfused.  Psych: No agitation or anxiety.  Neuro: AOAx3. Able to engage in conversation. Face symmetric. Moves all extremities spontaneously.      Suanne Marker, MD  Cardiovascular Fellow, PGY4  Internal Medicine [place]

## 2022-10-28 NOTE — Unmapped (Signed)
[facility]  Emergency Department Provider Note     ED Clinical Impression     Final diagnoses:   Crohn's disease of colon with complication (CMS-HCC)        Impression, Medical Decision Making, ED Course     Impression: 47 y.o. male who has a past medical history of Anemia (04/05/2022), Anxiety, Avascular necrosis of femur head, right (CMS-HCC) (2011), Bipolar disorder (CMS-HCC), Cannabis use disorder, COPD (chronic obstructive pulmonary disease) (CMS-HCC), Crohn's disease (CMS-HCC), Depression (2007), DVT (deep venous thrombosis) (CMS-HCC), Myalgia, and SBO (small bowel obstruction) (CMS-HCC) (2011). who presents with abdominal pain as described below.  Patient states that he has Crohn's disease and has been having a Crohn's flare for the last 4 days.  On physical exam he is nontoxic-appearing, hemodynamically stable, afebrile.  He is breathing comfortably on room air.  Lungs are clear to auscultation bilaterally without wheezing or crackles.  On cardiac exam he has regular rate, regular rhythm extremities are warm and well-perfused.  On abdominal exam he has evidence of multiple abdominal surgeries, he is tender to palpation in his right lower quadrant, suprapubic region and left lower quadrant.  Patient is being followed by [facility], currently on Humira.  That    DDx/MDM: Differential diagnosis  Differential diagnosis includes but is not limited to Crohn's flare, stricture, gastroenteritis, appendicitis, pyelonephritis, among other etiologies.  Given this patient's persistent symptoms and inadequate pain and nausea control at home, plan to admit patient for further management of Crohn's flare.  Patient recently had CT scan of the on 3/24 at [facility].  Have requested images to be sent over.     Diagnostic workup as below. Will treat patient with medication, antiemetic, IV hydration..    Orders Placed This Encounter   Procedures    Respiratory Pathogen Panel    CT INTERPRETATION OF OUTSIDE FILM    CT BODY INTERPRETATION OF OUTSIDE FILM [facility])    CT BODY INTERPRETATION OF OUTSIDE FILM [facility])    CT Body Outside Film For Continued Care    XR Chest 2 views    CBC w/ Differential    Comprehensive Metabolic Panel    Lipase Level    Urinalysis with Microscopy with Culture Reflex    C-reactive protein    Sedimentation rate, manual    Magnesium Level    Ferritin    Iron Panel    Iron Level    Nutrition Therapy Regular/House    Vital signs    Notify Provider    Patient may shower    Incentive Spirometry    Flush line per protocol    Weigh patient    Full Code    Inpatient consult to Nutrition Services    Special Airborne/Contact Isolation Status    ECG 12 Lead    Insert peripheral IV    Insert peripheral IV    Saline lock IV    Place Patient in Bed    ED Admit Decision       ED Course as of 10/29/22 1121   Tue Oct 28, 2022   0216 CBC with normocytic anemia, no elevation in white blood count.  CMP without evidence of gross electrolyte abnormality.  Mildly elevated alk phos otherwise unremarkable LFTs.  Lipase within normal limits making pancreatitis very unlikely.  Sed rate within normal limits.   0451 CRP within normal limits.  Patient had additional emesis.   0451 CT read, interpretation of outside film as follows:  IMPRESSION:  -- Prior ileocecectomy and right  hemicolectomy with lower abdominal ileocolonic anastomosis; wall thickening and mucosal enhancement of the neoterminal ileum and sigmoid colon compatible with enteritis/colitis from inflammatory versus infectious etiology.     --There is narrowing/thickening of the ileocolonic anastomosis similar to prior studies likely reflecting stricturing. Additionally, mild upstream dilation of the terminal ileum containing fecalized stool suggestive of ileus without evidence of obstruction.     --Apparent filling defect in the mid SMV likely represents a nonocclusive thrombus, the distal SMV is patent.     --Reactive enlarged mid mesenteric lymph nodes.     1610 MAO paged for patient admission for Crohn's flare.       Independent Interpretation of Studies: I have independently interpreted the following studies:  As above    Discussion of Management With Other Providers or Support Staff: I discussed the management of this patient with the:  Oncoming provider at 7 AM    Considerations Regarding Disposition/Escalation of Care and Critical Care:  Anticipate admission  ____________________________________________    The case was discussed with the attending physician, who is in agreement with the above assessment and plan.      History     Chief Complaint  Chief Complaint   Patient presents with    Abdominal Pain       HPI   Jerome Kennedy. is a 47 y.o. male with past medical history as below who presents with Crohn's flare.  States that he has been having increasing abdominal pain for the last 4 days.  States he has been seen at outside hospitals.  States that he is followed by [facility] and is on Humira.  States that they are thinking of changing his medication because of increased Crohn's flares.  States that he usually comes in the hospital when he has a Crohn's flare.  States that he has had 3 episodes of nonbloody emesis today.  States that he has had a few watery bowel movements, states one of them might of had blood in it.  States he has been having fevers.  Denies chest pain, shortness of breath, recent falls or trauma.    Outside Historian(s): I have obtained additional history/collateral from none.    External Records Reviewed: I have reviewed ED provider notes 10/27/2022.    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    Myalgia     SBO (small bowel obstruction) (CMS-HCC) 2011       Past Surgical History:   Procedure Laterality Date    CHOLECYSTECTOMY      COLON SURGERY      partial resection    HERNIA REPAIR      PR CLOSE ENTEROSTOMY,RESEC+COLOREC ANAS Midline 11/01/2019    Procedure: CLO ENTEROSTOMY; W/RESECT COLORECTAL ANASTOM;  Surgeon: Claretta Fraise, MD;  Location: MAIN OR [place];  Service: Gastrointestinal    PR COLONOSCOPY FLX DX W/COLLJ SPEC WHEN PFRMD Left 01/07/2013    Procedure: COLONOSCOPY, FLEXIBLE, PROXIMAL TO SPLENIC FLEXURE; DIAGNOSTIC, W/WO COLLECTION SPECIMEN BY BRUSH OR WASH;  Surgeon: Tish Men, MD;  Location: [facility];  Service: Gastroenterology    PR COLONOSCOPY FLX DX W/COLLJ SPEC WHEN PFRMD  03/09/2015    Procedure: COLONOSCOPY, FLEXIBLE, PROXIMAL TO SPLENIC FLEXURE; DIAGNOSTIC, W/WO COLLECTION SPECIMEN BY BRUSH OR WASH;  Surgeon: Cletis Athens, MD;  Location: GI PROCEDURES [facility]  Service: Gastroenterology    PR COLONOSCOPY FLX DX W/COLLJ SPEC WHEN PFRMD N/A 10/28/2019    Procedure: COLONOSCOPY, FLEXIBLE, PROXIMAL TO SPLENIC FLEXURE; DIAGNOSTIC, W/WO COLLECTION SPECIMEN BY BRUSH OR WASH;  Surgeon: Beverly Milch, MD;  Location: GI PROCEDURES [facility];  Service: Gastroenterology    PR COLONOSCOPY FLX DX W/COLLJ SPEC WHEN PFRMD N/A 10/01/2022    Procedure: COLONOSCOPY, FLEXIBLE, PROXIMAL TO SPLENIC FLEXURE; DIAGNOSTIC, W/WO COLLECTION SPECIMEN BY BRUSH OR WASH;  Surgeon: Zetta Bills, MD;  Location: GI PROCEDURES [facility]OR[facility];  Service: Gastroenterology    PR EXPLORATORY OF ABDOMEN Midline 09/02/2019    Procedure: EXPLORATORY LAPAROTOMY, EXPLORATORY CELIOTOMY WITH OR WITHOUT BIOPSY(S);  Surgeon: Claretta Fraise, MD;  Location: MAIN OR [facility];  Service: Gastrointestinal    PR EXPLORATORY OF ABDOMEN Midline 11/01/2019    Procedure: EXPLORATORY LAPAROTOMY, EXPLORATORY CELIOTOMY WITH OR WITHOUT BIOPSY(S);  Surgeon: Claretta Fraise, MD;  Location: MAIN OR [facility];  Service: Gastrointestinal    PR FIX FINGER,VOLAR PLATE,I-P JT Left 08/07/2022    Procedure: REPAIR AND RECONSTRUCTION, FINGER, VOLAR PLATE, INTERPHALANGEAL JOINT;  Surgeon: [name], MD;  Location: MAIN OR [facility];  Service: Orthopedics    PR FREEING BOWEL ADHESION,ENTEROLYSIS N/A 11/01/2019    Procedure: Enterolysis (Separt Proc);  Surgeon: Claretta Fraise, MD;  Location: MAIN OR [facility];  Service: Gastrointestinal    PR ILEOSCOPY THRU STOMA,BIOPSY N/A 10/14/2019    Procedure: Karren Cobble; [place] Saa 1/MX;  Surgeon: Neysa Hotter, MD;  Location: GI PROC[facility]URES [facility];  Service: Gastroenterology    PR PART REMOVAL COLON W COLOSTOMY Midline 09/02/2019    Procedure: COLECTOMY, PARTIAL; WITH SKIN LEVEL CECOSTOMY OR COLOSTOMY;  Surgeon: Claretta Fraise, MD;  Location: MAIN OR [facility];  Service: Gastrointestinal    PR REPAIR INCISIONAL HERNIA,REDUCIBLE Midline 09/02/2019    Procedure: REPAIR INIT INCISIONAL OR VENTRAL HERNIA; REDUCIBLE;  Surgeon: Kristopher Oppenheim, MD;  Location: MAIN OR [place];  Service: Trauma    PR REPAIR INTERCARP/CARP-METACARP JT Left 08/07/2022    Procedure: Jorene Guest;  Surgeon: [name], MD;  Location: MAIN OR [facility];  Service: Orthopedics    PR UPPER GI ENDOSCOPY,BIOPSY N/A 01/07/2013    Procedure: UGI ENDOSCOPY; WITH BIOPSY, SINGLE OR MULTIPLE;  Surgeon: Tish Men, MD;  Location: GI PROCEDURES [facility];  Service: Gastroenterology    PR UPPER GI ENDOSCOPY,BIOPSY N/A 10/14/2019    Procedure: UGI ENDOSCOPY; WITH BIOPSY, SINGLE OR MULTIPLE;  Surgeon: Neysa Hotter, MD;  Location: GI PROCEDURES [facility];  Service: Gastroenterology    PR UPPER GI ENDOSCOPY,DIAGNOSIS N/A 10/01/2022    Procedure: UGI ENDO, INCLUDE ESOPHAGUS, STOMACH, & DUODENUM &/OR JEJUNUM; DX W/WO COLLECTION SPECIMN, BY BRUSH OR WASH;  Surgeon: Zetta Bills, MD;  Location: GI PROCEDURES [facility];  Service: Gastroenterology         Current Facility-Administered Medications:     acetaminophen (TYLENOL) tablet 650 mg, 650 mg, Oral, Q6H PRN, Essig, Eustace Moore, MD    albuterol (PROVENTIL HFA;VENTOLIN HFA) 90 mcg/actuation inhaler 2 puff, 2 puff, Inhalation, 4x Daily (RT), Hemsey, Huntley Estelle, MD    calcium carbonate (TUMS) chewable tablet 400 mg of elem calcium, 400 mg of elem calcium,  Oral, BID, Essig, Eustace Moore, MD, 400 mg of elem calcium at 10/29/22 0919    cholecalciferol (vitamin D3 25 mcg (1,000 units)) tablet 25 mcg, 25 mcg, Oral, Daily, Essig, Eustace Moore, MD, 25 mcg at 10/29/22 0919    enoxaparin (LOVENOX) syringe 40 mg, 40 mg, Subcutaneous, At bedtime, Essig, Eustace Moore, MD, 40 mg at 10/28/22 2156    ferrous sulfate tablet 325 mg, 325 mg, Oral, Every other day, Essig, Jeremiah J, MD, 325 mg at 10/28/22 1202    fluticasone furoate-vilanterol (BREO ELLIPTA) 100-25 mcg/dose inhaler 1 puff, 1 puff, Inhalation, Daily (RT), Essig, Eustace Moore, MD, 1 puff at 10/29/22 0923    fluticasone propionate (FLONASE) 50 mcg/actuation nasal spray 1 spray, 1 spray, Each Nare, Q AM, Essig, Eustace Moore, MD, 1 spray at 10/29/22 0923    lactated Ringers infusion, 75 mL/hr, Intravenous, Continuous, Essig, Eustace Moore, MD, Last Rate: 75 mL/hr at 10/29/22 0557, 75 mL/hr at 10/29/22 0557    multivitamin with folic acid 400 mcg tablet 1 tablet, 1 tablet, Oral, Daily, Essig, Eustace Moore, MD, 1 tablet at 10/29/22 1610    nicotine (NICODERM CQ) 21 mg/24 hr patch 1 patch, 1 patch, Transdermal, Daily, Essig, Eustace Moore, MD, 1 patch at 10/29/22 0920    oxyCODONE (ROXICODONE) immediate release tablet 5 mg, 5 mg, Oral, Q6H PRN, Essig, Eustace Moore, MD, 5 mg at 10/29/22 0919    promethazine (PHENERGAN) tablet 12.5 mg, 12.5 mg, Oral, Q6H PRN, 12.5 mg at 10/29/22 0919 **OR** promethazine (PHENERGAN) injection 12.5 mg, 12.5 mg, Intramuscular, Q6H PRN, 12.5 mg at 10/28/22 0212 **OR** promethazine (PHENERGAN) suppository 12.5 mg, 12.5 mg, Rectal, Q6H PRN, Essig, Eustace Moore, MD    QUEtiapine (SEROquel) tablet 100 mg, 100 mg, Oral, Nightly, Essig, Eustace Moore, MD, 100 mg at 10/28/22 2156    Allergies  Morphine and Toradol [ketorolac]    Family History  Family History   Problem Relation Age of Onset    Diabetes Maternal Aunt     Crohn's disease Neg Hx        Social History  Social History     Tobacco Use    Smoking status: Former     Current packs/day: 0.00     Types: Cigarettes     Quit date: 10/02/2001     Years since quitting: 21.0    Smokeless tobacco: Never    Tobacco comments:     Motivated to quit. Smokes 0.5ppd but relights each cig   Vaping Use    Vaping status: Never Used   Substance Use Topics    Alcohol use: No     Alcohol/week: 0.0 standard drinks of alcohol    Drug use: Yes     Types: Marijuana     Comment: Denies currently, once a month        Physical Exam     VITAL SIGNS:      Vitals:    10/28/22 2145 10/28/22 2153 10/28/22 2155 10/29/22 0556   BP: 111/78   97/65   Pulse: 94   98   Resp: 18   17   Temp:  (!) 38.3 ??C (100.9 ??F)  37 ??C (98.6 ??F)   TempSrc:  Oral  Oral   SpO2: 96%   93%   Weight:   63.5 kg (140 lb)    Height:   175.3 cm (5' 9)        Constitutional: Alert and oriented. No acute distress.  Eyes: Conjunctivae are normal.  HEENT: Normocephalic and atraumatic.  Conjunctivae clear. No congestion. Moist mucous membranes.   Cardiovascular: Rate as above, regular rhythm. Normal and symmetric distal pulses. Brisk capillary refill. Normal skin turgor.  Respiratory: Normal respiratory effort. Breath sounds are normal. There are no wheezing or crackles heard.  Gastrointestinal: Soft, non-distended, tender in left lower quadrant and right lower quadrant..  Genitourinary: Deferred.  Musculoskeletal: Non-tender with normal range of motion in all extremities.  Neurologic: Normal speech and language. No gross focal neurologic deficits are appreciated. Patient is moving all extremities equally, face is symmetric at rest and with speech.  Skin: Skin is warm, dry and intact. No rash noted.  Psychiatric: Mood and affect are normal. Speech and behavior are normal.     Radiology     XR Chest 2 views   Final Result      No acute abnormalities.      CT Body Outside Film For Continued Care   Final Result      CT BODY INTERPRETATION OF OUTSIDE FILM [facility])   Final Result      CT BODY INTERPRETATION OF OUTSIDE FILM [facility])   Final Result   -- Prior ileocecectomy and right hemicolectomy with lower abdominal ileocolonic anastomosis; wall thickening and mucosal enhancement of the neoterminal ileum and sigmoid colon compatible with enteritis/colitis from inflammatory versus infectious etiology.      --There is narrowing/thickening of the ileocolonic anastomosis similar to prior studies likely reflecting stricturing. Additionally, mild upstream dilation of the terminal ileum containing fecalized stool suggestive of ileus without evidence of obstruction.      --Apparent filling defect in the mid SMV may represent mixing of unopacified and opacified blood or nonocclusive thrombus. The distal SMV is patent.      --Reactive enlarged mid mesenteric lymph nodes.         PLEASE NOTE:  Our interpretation of studies performed at an outside institution is limited by factors including the absence of technical specifics of the image, undisclosed clinical information and the unavailability of the original interpretation.  Specialists at the institution that performed the study may have access to information not available to [place] that could make a difference in this interpretation.  We suggest that you obtain the original interpretation from the site where the study was performed.      ====================   MODIFIED REPORT:   (10/28/2022 7:55 AM)   This report has been modified from its preliminary version; you may check the prior versions of radiology report, results history link for prior report versions (if they were previously visible in Epic).      ----------------------------------------------- CT INTERPRETATION OF OUTSIDE FILM    (Results Pending)       Pertinent labs & imaging results that were available during my care of the patient were independently interpreted by me and considered in my medical decision making (see chart for details).    Portions of this record have been created using Scientist, clinical (histocompatibility and immunogenetics). Dictation errors have been sought, but may not have been identified and corrected.         Lucretia Roers, MD  Resident  10/29/22 [phone]

## 2022-10-28 NOTE — Unmapped (Signed)
Pt from streets [facility]  c/o RLQ pain and diarrhea for past 2 days, Hx Crohn's, vomiting x2 after eating pizza tonight

## 2022-10-29 MED ORDER — PROMETHAZINE 12.5 MG TABLET
ORAL_TABLET | Freq: Four times a day (QID) | ORAL | 0 refills | 5 days | Status: CP | PRN
Start: 2022-10-29 — End: 2022-11-05
  Filled 2022-10-29: qty 20, 5d supply, fill #0

## 2022-10-29 MED ORDER — ALBUTEROL SULFATE HFA 90 MCG/ACTUATION AEROSOL INHALER
Freq: Four times a day (QID) | RESPIRATORY_TRACT | 0 refills | 0 days | Status: CP
Start: 2022-10-29 — End: 2023-10-29
  Filled 2022-10-29: qty 18, 25d supply, fill #0

## 2022-10-29 MED ORDER — OXYCODONE 5 MG TABLET
ORAL_TABLET | Freq: Four times a day (QID) | ORAL | 0 refills | 4 days | Status: CP | PRN
Start: 2022-10-29 — End: 2022-11-03
  Filled 2022-10-29: qty 16, 4d supply, fill #0

## 2022-10-29 MED ADMIN — promethazine (PHENERGAN) tablet 12.5 mg: 12.5 mg | ORAL | @ 20:00:00 | Stop: 2022-10-29

## 2022-10-29 MED ADMIN — promethazine (PHENERGAN) tablet 12.5 mg: 12.5 mg | ORAL | @ 13:00:00 | Stop: 2022-10-29

## 2022-10-29 MED ADMIN — lactated Ringers infusion: 75 mL/h | INTRAVENOUS | @ 02:00:00 | Stop: 2022-10-29

## 2022-10-29 MED ADMIN — calcium carbonate (TUMS) chewable tablet 400 mg of elem calcium: 400 mg | ORAL | @ 02:00:00

## 2022-10-29 MED ADMIN — promethazine (PHENERGAN) tablet 12.5 mg: 12.5 mg | ORAL | @ 07:00:00 | Stop: 2022-10-29

## 2022-10-29 MED ADMIN — QUEtiapine (SEROquel) tablet 100 mg: 100 mg | ORAL | @ 02:00:00

## 2022-10-29 MED ADMIN — fluticasone propionate (FLONASE) 50 mcg/actuation nasal spray 1 spray: 1 | NASAL | @ 13:00:00 | Stop: 2022-10-29

## 2022-10-29 MED ADMIN — calcium carbonate (TUMS) chewable tablet 400 mg of elem calcium: 400 mg | ORAL | @ 13:00:00 | Stop: 2022-10-29

## 2022-10-29 MED ADMIN — albuterol (PROVENTIL HFA;VENTOLIN HFA) 90 mcg/actuation inhaler 2 puff: 2 | RESPIRATORY_TRACT | @ 17:00:00 | Stop: 2022-10-29

## 2022-10-29 MED ADMIN — enoxaparin (LOVENOX) syringe 40 mg: 40 mg | SUBCUTANEOUS | @ 02:00:00

## 2022-10-29 MED ADMIN — nicotine (NICODERM CQ) 21 mg/24 hr patch 1 patch: 1 | TRANSDERMAL | @ 13:00:00 | Stop: 2022-10-29

## 2022-10-29 MED ADMIN — fluticasone furoate-vilanterol (BREO ELLIPTA) 100-25 mcg/dose inhaler 1 puff: 1 | RESPIRATORY_TRACT | @ 13:00:00 | Stop: 2022-10-29

## 2022-10-29 MED ADMIN — oxyCODONE (ROXICODONE) immediate release tablet 5 mg: 5 mg | ORAL | @ 07:00:00 | Stop: 2022-10-29

## 2022-10-29 MED ADMIN — multivitamin with folic acid 400 mcg tablet 1 tablet: 1 | ORAL | @ 13:00:00 | Stop: 2022-10-29

## 2022-10-29 MED ADMIN — oxyCODONE (ROXICODONE) immediate release tablet 5 mg: 5 mg | ORAL | @ 20:00:00 | Stop: 2022-10-29

## 2022-10-29 MED ADMIN — oxyCODONE (ROXICODONE) immediate release tablet 5 mg: 5 mg | ORAL | @ 13:00:00 | Stop: 2022-10-29

## 2022-10-29 MED ADMIN — cholecalciferol (vitamin D3 25 mcg (1,000 units)) tablet 25 mcg: 25 ug | ORAL | @ 13:00:00 | Stop: 2022-10-29

## 2022-10-29 NOTE — Unmapped (Signed)
Here due to suspected Crohn's flare.  No diarrhea, nausea/vomiting noted at this time.  On IVF, LR @ 75 ml/hr.  Non-monitored.  Lungs clear to auscultation.  Safety maintained.  Bed alarm on.  Call bell kept within reach.  Awaiting for stool sample for c-diff rule out.  Asleep.  POC reviewed.  Will continue with plan of care.  Problem: Adult Inpatient Plan of Care  Goal: Plan of Care Review  Outcome: Ongoing - Unchanged  Goal: Patient-Specific Goal (Individualized)  Outcome: Ongoing - Unchanged  Goal: Absence of Hospital-Acquired Illness or Injury  Outcome: Ongoing - Unchanged  Intervention: Identify and Manage Fall Risk  Recent Flowsheet Documentation  Taken 10/28/2022 2210 by Reynold Bowen, RN  Safety Interventions:   bed alarm   fall reduction program maintained   lighting adjusted for tasks/safety   low bed   nonskid shoes/slippers when out of bed  Intervention: Prevent Skin Injury  Recent Flowsheet Documentation  Taken 10/29/2022 0000 by Reynold Bowen, RN  Positioning for Skin: Left  Taken 10/28/2022 2210 by Reynold Bowen, RN  Positioning for Skin: Supine/Back  Skin Protection: incontinence pads utilized  Intervention: Prevent and Manage VTE (Venous Thromboembolism) Risk  Recent Flowsheet Documentation  Taken 10/28/2022 2210 by Reynold Bowen, RN  Anti-Embolism Intervention: (Lovenox SQ) Other (Comment)  Goal: Optimal Comfort and Wellbeing  Outcome: Ongoing - Unchanged  Goal: Readiness for Transition of Care  Outcome: Ongoing - Unchanged  Goal: Rounds/Family Conference  Outcome: Ongoing - Unchanged     Problem: Infection  Goal: Absence of Infection Signs and Symptoms  Outcome: Ongoing - Unchanged     Problem: Wound  Goal: Optimal Coping  Outcome: Ongoing - Unchanged  Goal: Optimal Functional Ability  Outcome: Ongoing - Unchanged  Intervention: Optimize Functional Ability  Recent Flowsheet Documentation  Taken 10/29/2022 0000 by Reynold Bowen, RN  Activity Management: bedrest  Taken 10/28/2022 2210 by Reynold Bowen, RN  Activity Management: bedrest  Goal: Absence of Infection Signs and Symptoms  Outcome: Ongoing - Unchanged  Goal: Improved Oral Intake  Outcome: Ongoing - Unchanged  Goal: Optimal Pain Control and Function  Outcome: Ongoing - Unchanged  Goal: Skin Health and Integrity  Outcome: Ongoing - Unchanged  Intervention: Optimize Skin Protection  Recent Flowsheet Documentation  Taken 10/29/2022 0000 by Reynold Bowen, RN  Activity Management: bedrest  Taken 10/28/2022 2210 by Reynold Bowen, RN  Activity Management: bedrest  Pressure Reduction Techniques: frequent weight shift encouraged  Head of Bed (HOB) Positioning: HOB at 30-45 degrees  Pressure Reduction Devices: pressure-redistributing mattress utilized  Skin Protection: incontinence pads utilized  Goal: Optimal Wound Healing  Outcome: Ongoing - Unchanged

## 2022-10-29 NOTE — Unmapped (Signed)
Social Work  Psychosocial Assessment    Patient Name: Jerome Kennedy.   Medical Record Number: [id number]   Date of Birth: [birth year]  Sex: Male     Referral  Referred by: Care Manager  Reason for Referral: Homeless Patient / Family    Assessment    SW introduced self and role. Patient was oriented x4. SW spoke to patient.   at bedside to complete psychosocial assessment.     Patient reports that they were independent at baseline. Pt stated that he is not homeless and plans to dc back to address listed on facesheet. Discharge transportation will be provided by friend/family, taxi if needed.     SW reviewed SDOH food insecurity, housing affordability, financial resource strain and transportation.SW provided resources for future use. SW encouraged patient to reach out to SW if needed. Patient verbalized understanding.     Extended Emergency Contact Information  Primary Emergency Contact: Brick,Virginia  Address: [address] [place], [place] [address] [place]  Home Phone: [phone]  Relation: Mother  Secondary Emergency Contact: Reigel,Bryant  Mobile Phone: [phone]  Relation: Brother  Preferred language: ENGLISH  Interpreter needed? No    Legal Next of Kin / Guardian / POA / Advance Directives    HCDM (patient stated preference): Flake, Persinger - Mother - [phone]    Advance Directive (Medical Treatment)  Does patient have an advance directive covering medical treatment?: Patient does not have advance directive covering medical treatment.    Health Care Decision Maker [HCDM] (Medical & Mental Health Treatment)  Healthcare Decision Maker: Patient does not wish to appoint a Health Care Decision Maker at this time  Information offered on HCDM, Medical & Mental Health advance directives:: Patient declined information.    Advance Directive (Mental Health Treatment)  Does patient have an advance directive covering mental health treatment?: Patient does not have advance directive covering mental health treatment.  Reason patient does not have an advance directive covering mental health treatment:: Patient does not wish to complete one at this time.    Discharge Planning  See CM Initial Transition Planning Assesssment for Discharge Planning Information.    Social Determinants of Health  Social Determinants of Health     Financial Resource Strain: Low Risk  (08/20/2022)    Overall Financial Resource Strain (CARDIA)     Difficulty of Paying Living Expenses: Not very hard   Internet Connectivity: Not on file   Food Insecurity: Food Insecurity Present (08/20/2022)    Hunger Vital Sign     Worried About Running Out of Food in the Last Year: Sometimes true     Ran Out of Food in the Last Year: Sometimes true   Tobacco Use: Medium Risk (10/27/2022)    Patient History     Smoking Tobacco Use: Former     Smokeless Tobacco Use: Never     Passive Exposure: Not on file   Housing/Utilities: High Risk (08/20/2022)    Housing/Utilities     Within the past 12 months, have you ever stayed: outside, in a car, in a tent, in an overnight shelter, or temporarily in someone else's home (i.e. couch-surfing)?: Yes     Are you worried about losing your housing?: Yes     Within the past 12 months, have you been unable to get utilities (heat, electricity) when it was really needed?: No   Alcohol Use: Not on file   Transportation Needs: No Transportation Needs (  08/20/2022)    PRAPARE - Transportation     Lack of Transportation (Medical): No     Lack of Transportation (Non-Medical): No   Substance Use: Not on file   Health Literacy: Not on file   Physical Activity: Not on File (06/21/2021)    Received from [facility]     Physical Activity     Physical Activity: 0   Interpersonal Safety: Not on file   Stress: Not on File (06/21/2021)    Received from [facility]     Stress     Stress: 0   Intimate Partner Violence: Not on file   Depression: Not on file   Social Connections: Not on File (06/21/2021)    Received from [facility]     Social Connections Social Connections and Isolation: 0       Social History  Support Systems/Concerns: Manufacturing engineer, Environmental manager: No Conservator, museum/gallery and Psychiatric History  Psychosocial Stressors: Denies      Psychological Issues/Information: No issues              Chemical Dependency: None                     Legal: No legal issues      Ability to [employer]: No issues accessing community services

## 2022-10-29 NOTE — Unmapped (Incomplete)
Hospital Medicine Progress Note    Assessment/Plan:    Principal Problem:    Exacerbation of Crohn's disease (CMS-HCC)  Active Problems:    Crohn's disease (CMS-HCC)    Abdominal pain    Nausea & vomiting    Hypoalbuminemia    Iron deficiency anemia    Bipolar 1 disorder (CMS-HCC)    COPD (chronic obstructive pulmonary disease) (CMS-HCC)    Homelessness    Hx of deep venous thrombosis    Rhinovirus infection      Jerome Mckeever. is a 47 y.o. y/o male that presents to [facility] with Exacerbation of Crohn's disease (CMS-HCC).        FEN: Regular Diet  PPx: Low molecular weight heparin.  Disposition: Inpatient, likely home next 24 hours if tolerating diet  Code: FULL     Donavan Foil, MD  Please page [facility] [phone] with questions.      I personally spent *** minutes face-to-face and non-face-to-face in the care of this patient, which includes all pre, intra, and post visit time on the date of service.  All documented time was specific to the E/M visit and does not include any procedures that may have been performed.    ___________________________________________________________________    Subjective:    No further nausea or vomiting and feels hungry.  Would like to eat.  Reports that he was primarily concerned about vomiting and feels improved.  Denies any diarrhea in recent weeks, last stool was Monday and was formed, without blood.  No abdominal pain this am.  Took oral pain medication overnight.    Does note new cough with green sputum over last couple of days and some wheezing.  Some low grade fever noted overnight.    Objective:    Vital signs in last 24 hours:  Temp:  [37 ??C (98.6 ??F)-38.3 ??C (100.9 ??F)] 37 ??C (98.6 ??F)  Heart Rate:  [78-98] 98  Resp:  [17-18] 17  BP: (97-121)/(65-80) 97/65  MAP (mmHg):  [75-98] 75  SpO2:  [93 %-99 %] 93 %  BMI (Calculated):  [20.67] 20.67    Intake/Output last 3 shifts:  I/O last 3 completed shifts:  In: 703.8 [P.O.:120; I.V.:583.8]  Out: 1395 [Urine:1395]    Physical Exam:  GEN:  In bed, some cough noted, appears comfortable  EYES:  anicteric  ENT:  OP moist and No lesions  CV:  RRR, Normal S1, S2, and No S3 or murmur  PULM:  diffuse expiratory wheezing  ABD:   large reducible ventral hernia, soft, nontender, no masses, and good bowel sounds  EXT:  No edema and Equal distal pulses  NEURO:  Alert and Oriented x 3    Medications:  Scheduled Meds:   calcium carbonate  400 mg of elem calcium Oral BID    cholecalciferol (vitamin D3 25 mcg (1,000 units))  25 mcg Oral Daily    enoxaparin (LOVENOX) injection  40 mg Subcutaneous At bedtime    ferrous sulfate  325 mg Oral Every other day    fluticasone furoate-vilanterol  1 puff Inhalation Daily (RT)    fluticasone propionate  1 spray Each Nare Q AM    multivitamins (ADULT)  1 tablet Oral Daily    nicotine  1 patch Transdermal Daily    QUEtiapine  100 mg Oral Nightly     Continuous Infusions:   lactated Ringers 75 mL/hr (10/29/22 0557)     PRN Meds:.acetaminophen, oxyCODONE, promethazine **OR** promethazine **OR** promethazine    Labs/Studies:  Labs/Studies reviewed by  me.  Notable for values below.    Recent Labs   Lab Units 10/28/22  0059   WBC 10*9/L 5.2   RBC 10*12/L 4.37   HEMOGLOBIN g/dL 08.6*   HEMATOCRIT % 57.8*   MCV fL 86.7   MCH pg 28.3   MCHC g/dL 46.9   RDW % 62.9*   PLATELET COUNT (1) 10*9/L 252   MPV fL 7.7       Recent Labs   Lab Units 10/28/22  0059   SODIUM mmol/L 143   POTASSIUM mmol/L 3.7   CHLORIDE mmol/L 112*   CO2 mmol/L 26.0   BUN mg/dL 9   CREATININE mg/dL 5.28   GLUCOSE mg/dL 84

## 2022-10-29 NOTE — Unmapped (Signed)
Physician Discharge Summary [facility]  [address] [facility]  [address]  [place] [address]  Dept: [phone]  Loc: [phone]     Identifying Information:   Jerome Kennedy  1976/06/28  [id number]    Primary Care Physician: Pcp, None Per Patient   Code Status: Full Code    Admit Date: 10/28/2022    Discharge Date: 10/29/2022     Discharge To: Home    Discharge Service: [facility]     Discharge Attending Physician: Earna Coder, MD    Discharge Diagnoses:  Principal Problem:    Exacerbation of Crohn's disease (CMS-HCC) (POA: Yes)  Active Problems:    Crohn's disease (CMS-HCC) (POA: Yes)    Abdominal pain (POA: Yes)    Nausea & vomiting (POA: Yes)    Hypoalbuminemia (POA: Yes)    Iron deficiency anemia (POA: Yes)    Bipolar 1 disorder (CMS-HCC) (POA: Yes)    COPD (chronic obstructive pulmonary disease) (CMS-HCC) (POA: Yes)    Homelessness (POA: Not Applicable)    Hx of deep venous thrombosis (POA: Not Applicable)    Rhinovirus infection (POA: Unknown)  Resolved Problems:    * No resolved hospital problems. *      Outpatient Provider Follow Up Issues:   [ ]  GI follow up for reevaluation of his Crohn's disease    Hospital Course:   Jerome Zender. is a 47 y.o. male whose presentation is complicated by Crohn's Disease on Humira, IDA, hx of RUE DVT, COPD, Bipolar disorder, and unstable housing who presented to 3 different ED over 48 hours with abdominal pain, vomiting, and diarrhea consistent with Crohn's flare.     RLQ Pain, Vomiting - C/f Acute Exacerbation of Crohn's Disease  Presented with abdominal pain and nausea with intermittent vomiting.  Has been seen in multiple ERs.  Had a CT at [facility] which was reviewed here and showed nonspecific changes setting of Crohn's and previous surgeries.  No clear acute findings.  No elevation in WBC or CRP.  Was discussed with GI who did not recommend changes in his Crohn's regimen.  He was admitted for IVF and symptomatic management.  Had 1 loose stool but no significant diarrhea to suggest complicating infection.  Tolerating clears initially but then was advanced to a regular diet.  He he was able to tolerate a small amount of diet and pain and nausea was reasonably controlled with oral oxycodone and promethazine.  He was discharged with promethazine and oxycodone for short course.  He was instructed to continue his adalimumab and follow-up with GI as previously scheduled.    Rhino/enterovirus infection/COPD exacerbation:  Patient reported nasal congestion, cough with yellow-green sputum over the last couple of days.  Also noted to have some low-grade fevers.  Exam notable for mild wheezing.  Did not require O2.  No elevated WBC.  CXR was negative for pneumonia.  RPP was notable for rhino/enterovirus infection.  Suspect had mild COPD exacerbation in the setting of his acute viral infection.  He was given albuterol inhaler on discharge.  He was instructed to continue his usual COPD medications.        Iron Deficiency Anemia  Repeat iron studies notable for an iron saturation ~20% with a normal ferritin which could be seen in either chronic disease or iron deficiency.  He was continued on oral iron.     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 He was previously on full dose anticoagulation, however this  was stopped previously per hematology evaluation for history of GI bleed.  He was treated for DVT prophylaxis but does not require treatment dose at this time.     Bipolar Disorder  No acute issues.  He was continued on quetiapine.    Procedures:  None  No admission procedures for hospital encounter.  ______________________________________________________________________  Discharge Medications:     Your Medication List        START taking these medications      albuterol 90 mcg/actuation inhaler  Commonly known as: PROVENTIL HFA;VENTOLIN HFA  Inhale 2 puffs four (4) times a day.     oxyCODONE 5 MG immediate release tablet  Commonly known as: ROXICODONE  Take 1 tablet (5 mg total) by mouth every six (6) hours as needed for up to 5 days.     promethazine 12.5 MG tablet  Commonly known as: PHENERGAN  Take 1 tablet (12.5 mg total) by mouth every six (6) hours as needed for up to 7 days.            CONTINUE taking these medications      ascorbic acid (vitamin C) 250 MG tablet  Commonly known as: VITAMIN C  Take 1 tablet (250 mg total) by mouth every other day. Take with oral iron     budesonide-formoterol 80-4.5 mcg/actuation inhaler  Commonly known as: SYMBICORT  Inhale 2 puffs two (2) times a day as needed.     calcium carbonate 200 mg calcium (500 mg) chewable tablet  Commonly known as: TUMS  Chew 2 tablets (400 mg of elem calcium total) two (2) times a day.     cholecalciferol (vitamin D3 25 mcg (1,000 units)) 1,000 unit (25 mcg) tablet  Take 1 tablet (25 mcg total) by mouth daily.     cyanocobalamin (vitamin B-12) 1,000 mcg Subl  Place 2,000 mcg under the tongue in the morning.     empty container Misc  Use as directed to dispose of Humira     ferrous sulfate 325 (65 FE) MG tablet  Take 1 tablet (325 mg total) by mouth every other day.     fluticasone propionate 50 mcg/actuation nasal spray  Commonly known as: FLONASE  2 sprays into each nostril daily as needed for allergies.     HUMIRA(CF) PEN 40 mg/0.4 mL injection  Generic drug: adalimumab  Inject the contents of 1 pen (40 mg total) under the skin every seven (7) days.     nicotine 21 mg/24 hr patch  Commonly known as: NICODERM CQ  Place 1 patch on the skin daily.     OPTICHAMBER DIAMOND VHC Spcr  Generic drug: inhalational spacing device     pantoprazole 40 MG tablet  Commonly known as: Protonix  Take 1 tablet (40 mg total) by mouth daily.     QUEtiapine 100 MG tablet  Commonly known as: SEROquel  Take 1 tablet (100 mg total) by mouth nightly.              Allergies:  Morphine and Toradol [ketorolac]  ______________________________________________________________________  Pending Test Results (if blank, then none):      Most Recent Labs:  CBC - Results in Past 2 Days  Result Component Current Result   WBC 5.2 (10/28/2022)   RBC 4.37 (10/28/2022)   HGB 12.4 (L) (10/28/2022)   HCT 37.8 (L) (10/28/2022)   MCV 86.7 (10/28/2022)   MCH 28.3 (10/28/2022)   MCHC 32.7 (10/28/2022)   Metapneumovirus Not Detected (10/29/2022)   Platelet 252 (10/28/2022)  BMP - Results in Past 2 Days  Result Component Current Result   Sodium 143 (10/28/2022)   Potassium 3.7 (10/28/2022)   Chloride 112 (H) (10/28/2022)   CO2 26.0 (10/28/2022)   BUN 9 (10/28/2022)   Creatinine 0.89 (10/28/2022)   EST.GFR (MDRD) Not in Time Range   Glucose 84 (10/28/2022)       Relevant Studies/Radiology (if blank, then none):  XR Chest 2 views    Result Date: 10/29/2022    No acute abnormalities.    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 --There is narrowing/thickening of the ileocolonic anastomosis similar to prior studies likely reflecting stricturing. Additionally, mild upstream dilation of the terminal ileum containing fecalized stool suggestive of ileus without evidence of obstruction. --Apparent filling defect in the mid SMV may represent mixing of unopacified and opacified blood or nonocclusive thrombus. The distal SMV is patent. --Reactive enlarged mid mesenteric lymph nodes.     ______________________________________________________________________  Discharge Instructions:     Diet Instructions       Discharge diet (specify)      Nutrition Therapy: General (Regular)    Discharge Nutrition Therapy: Regular            Follow Up instructions and Outpatient Referrals     Call MD for:  difficulty breathing, headache or visual disturbances      Call MD for:  severe uncontrolled pain Call MD for:  temperature >38.5 Celsius      Discharge instructions          Other Instructions       Call MD for:  difficulty breathing, headache or visual disturbances      Call MD for:  severe uncontrolled pain      Call MD for:  temperature >38.5 Celsius      Discharge instructions      You were admitted with vomiting and abdominal pain which may relate to your Crohn's disease.  We did not find any new causes for your pain and recommend that you continue your usual medications at this time including adalimumab (Humira) injection once a week.  We have given you a small amount of nausea and pain medication which you can use if needed as you improve.  We also diagnosed you with a viral respiratory tract infection.  You have been given an albuterol inhaler which you can use 2 puffs up to 4 times daily if needed for cough and wheezing.    Discharge Instructions for Upper Respiratory Infection    An upper respiratory infection is also called the common cold. It is an infection that can affect your nose, throat, ears, and sinuses. For healthy people, the common cold is usually not serious and does not need special treatment. Cold symptoms are usually worst for the first 3 to 5 days. Most people get better in 7 to 14 days. You may continue to cough for 2 to 3 weeks. Colds are caused by viruses and do not get better with antibiotics.    Contact your primary healthcare provider if:      Your sore throat gets worse or you see white or yellow spots in your throat.       Your symptoms get worse after 3 to 5 days or your cold is not  better in 14 days.      You cough up thick yellow, green, gray, or bloody mucus.      You have vomiting for more than 24 hours and cannot keep fluids down.      You have a bad earache.      You have questions or concerns about your condition or care.    No dressing needed              Appointments which have been scheduled for you      Nov 13, 2022  8:30 AM  (Arrive by 8:20 AM)  NEW  GENERAL with Aleen Campi, MD  [facility]  [facility]) [address]  [place] [address]  [phone]             ______________________________________________________________________  Discharge Day Services:  BP 116/71  - Pulse 100  - Temp 37.8 ??C (100 ??F) (Oral)  - Resp 17  - Ht 175.3 cm (5' 9)  - Wt 63.5 kg (140 lb)  - SpO2 97%  - BMI 20.67 kg/m??   Pt seen on the day of discharge and determined appropriate for discharge.    Condition at Discharge: good    Length of Discharge: I spent greater than 30 mins in the discharge of this patient.

## 2022-10-29 NOTE — Unmapped (Signed)
Pt AXO4. Able to ambulate to bathroom, had a loose BM x1. C/o nausea and abdominal pain. Phenergan and oxycodone given, pt reported improvement. Bed in lowest position and call bell within reach.     Problem: Adult Inpatient Plan of Care  Goal: Plan of Care Review  Outcome: Progressing  Flowsheets (Taken 10/29/2022 1634)  Progress: improving  Plan of Care Reviewed With: patient  Goal: Patient-Specific Goal (Individualized)  Outcome: Progressing  Goal: Absence of Hospital-Acquired Illness or Injury  Outcome: Progressing  Goal: Optimal Comfort and Wellbeing  Outcome: Progressing  Goal: Readiness for Transition of Care  Outcome: Progressing  Goal: Rounds/Family Conference  Outcome: Progressing     Problem: Infection  Goal: Absence of Infection Signs and Symptoms  Outcome: Progressing     Problem: Wound  Goal: Optimal Coping  Outcome: Progressing  Goal: Optimal Functional Ability  Outcome: Progressing  Goal: Absence of Infection Signs and Symptoms  Outcome: Progressing  Goal: Improved Oral Intake  Outcome: Progressing  Goal: Optimal Pain Control and Function  Outcome: Progressing  Goal: Skin Health and Integrity  Outcome: Progressing  Goal: Optimal Wound Healing  Outcome: Progressing

## 2022-10-29 NOTE — Unmapped (Signed)
Jerome Kennedy. is a 47 y.o. male whose presentation is complicated by Crohn's Disease on Humira, IDA, hx of RUE DVT, COPD, Bipolar disorder, and unstable housing who presented to 3 different ED over 48 hours with abdominal pain, vomiting, and diarrhea consistent with Crohn's flare.     RLQ Pain, Vomiting - C/f Acute Exacerbation of Crohn's Disease  Presented with abdominal pain and nausea with intermittent vomiting.  Has been seen in multiple ERs.  Had a CT at [facility] which was reviewed here and showed nonspecific changes setting of Crohn's and previous surgeries.  No clear acute findings.  No elevation in WBC or CRP.  Was discussed with GI who did not recommend changes in his Crohn's regimen.  He was admitted for IVF and symptomatic management.  Had 1 loose stool but no significant diarrhea to suggest complicating infection.  Tolerating clears initially but then was advanced to a regular diet.  He he was able to tolerate a small amount of diet and pain and nausea was reasonably controlled with oral oxycodone and promethazine.  He was discharged with promethazine and oxycodone for short course.  He was instructed to continue his adalimumab and follow-up with GI as previously scheduled.    Rhino/enterovirus infection/COPD exacerbation:  Patient reported nasal congestion, cough with yellow-green sputum over the last couple of days.  Also noted to have some low-grade fevers.  Exam notable for mild wheezing.  Did not require O2.  No elevated WBC.  CXR was negative for pneumonia.  RPP was notable for rhino/enterovirus infection.  Suspect had mild COPD exacerbation in the setting of his acute viral infection.  He was given albuterol inhaler on discharge.  He was instructed to continue his usual COPD medications.        Iron Deficiency Anemia  Repeat iron studies notable for an iron saturation ~20% with a normal ferritin which could be seen in either chronic disease or iron deficiency.  He was continued on oral iron. 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 He was previously on full dose anticoagulation, however this was stopped previously per hematology evaluation for history of GI bleed.  He was treated for DVT prophylaxis but does not require treatment dose at this time.     Bipolar Disorder  No acute issues.  He was continued on quetiapine.

## 2022-10-29 NOTE — Unmapped (Signed)
Adult Nutrition Assessment Note    Visit Type: MD Consult  Reason for Visit: Assessment (Nutrition)      HPI & PMH:  Jerome Kennedy. is a 47 y.o. male whose presentation is complicated by Crohn's Disease on Humira, IDA, hx of RUE DVT, COPD, Bipolar disorder, and unstable housing who presented to 3 different ED over 48 hours with abdominal pain, vomiting, and diarrhea consistent with Crohn's flare.     Anthropometric Data:  Height: 175.3 cm (5' 9)   Admission weight: 63.5 kg (140 lb)  Last recorded weight: 63.5 kg (140 lb)  IBW: 72.64 kg  Percent IBW: 87.42 %  BMI: Body mass index is 20.67 kg/m??.   Usual Body Weight: Unable to obtain at this time     Weight history prior to admission:  appears to fluctuate between 135-150# since 04/2022 per below     Wt Readings from Last 10 Encounters:   10/28/22 63.5 kg (140 lb)   10/17/22 63.2 kg (139 lb 4.8 oz)   10/12/22 70.3 kg (155 lb)   10/01/22 68 kg (150 lb)   09/17/22 61.2 kg (135 lb)   09/02/22 61.2 kg (135 lb)   08/07/22 61.4 kg (135 lb 5.8 oz)   07/25/22 66 kg (145 lb 8 oz)   06/20/22 62 kg (136 lb 11.2 oz)   04/05/22 62.3 kg (137 lb 5.6 oz)        Weight changes this admission:   Last 5 Recorded Weights    10/27/22 2128 10/28/22 2155   Weight: 63.5 kg (140 lb) 63.5 kg (140 lb)        Nutrition Focused Physical Exam:  Unable to complete at this time due to working remotely today       NUTRITIONALLY RELEVANT DATA     Medications:   Nutritionally pertinent medications reviewed and evaluated for potential food and/or medication interactions.   LR @ 75 mL/hr, tums, vit D 1000, iron, multivitamin, seroquel     Labs:   Nutritionally pertinent labs reviewed.   CL (112), albumin (2.4), elevated alk phos, low TIBC, low alk phos     Nutrition History:   October 29, 2022: Prior to admission:  Unable to reach patient x4 attempts. Admitted with Crohn's exacerbation/flare. House diet, has nausea/cramping per chart review. Per chart review, takes vit C 250 mg. Tums, vitamin D 1000, B12 1000, iron, at home.     Allergies, Intolerances, Sensitivities, and/or Cultural/Religious Dietary Restrictions: none identified per chart review at this time     Current Nutrition:  Oral intake     Nutrition Orders            Nutrition Therapy Regular/House starting at 03/27 0808              Nutritional Needs:   Healthy balance of carbohydrate, protein, and fat.       Malnutrition assessment not yet completed at this time due lack of nutrition history and inability to complete nutrition focused physical exam (NFPE).     GOALS and EVALUATION     Patient to consume 75% or greater of po intake via combination of meals, snacks, and/or oral supplements within hospital stay remainder.  - New    Motivation, Barriers, and Compliance:  Evaluation of motivation, barriers, and compliance pending at this time due to clinical status.     NUTRITION ASSESSMENT     Current nutrition therapy is appropriate and progressing toward meeting meeting nutritional needs at this time.   Patient  would benefit from start of oral supplement to better meet nutritional needs.  Micronutrients per below       Discharge Planning:   Monitor for potential discharge needs with multi-disciplinary team.     Was the nutrition care plan completed? No, unable to diagnose malnutrition at this time       NUTRITION INTERVENTIONS and RECOMMENDATION     Continue house diet - if needed, can downgrade to low fiber diet during active flare   Ordered ensure high protein BID to supplement PO   Continue vit D, iron, multivitamin as ordered     Follow-Up Parameters:   1-2 times per week (and more frequent as indicated)    Forestine Chute MPH, RD, CNSC, LDN   Pager: [phone]  Phone: [phone]  [email or web address]

## 2022-10-30 NOTE — Unmapped (Signed)
Patient discharged at 1735 on 10/29/2022. AVS reviewed, discharge medications will be picked up from outpatient pharmacy with transport assist. PIV removed. Pt verbalized understanding and has no concerns. Pt requested to have taxi company be called, Consulting civil engineer assisted.

## 2022-11-04 NOTE — Unmapped (Signed)
[facility] Refill Coordination Note    Specialty Medication(s) to be Shipped:   Inflammatory Disorders: Humira    Other medication(s) to be shipped: No additional medications requested for fill at this time     Jerome Kennedy., DOB: 07/02/1976  Phone: [phone] (home)       All above HIPAA information was verified with patient.     Was a Nurse, learning disability used for this call? No    Completed refill call assessment today to schedule patient's medication shipment from the [facility] [phone]).  All relevant notes have been reviewed.     Specialty medication(s) and dose(s) confirmed: Regimen is correct and unchanged.   Changes to medications: Jerome Kennedy reports no changes at this time.  Changes to insurance: No  New side effects reported not previously addressed with a pharmacist or physician: None reported  Questions for the pharmacist: No    Confirmed patient received a Conservation officer, historic buildings and a Surveyor, mining with first shipment. The patient will receive a drug information handout for each medication shipped and additional FDA Medication Guides as required.       DISEASE/MEDICATION-SPECIFIC INFORMATION        For patients on injectable medications: Patient currently has 1 doses left.  Next injection is scheduled for 11/09/22.    SPECIALTY MEDICATION ADHERENCE     Medication Adherence    Patient reported X missed doses in the last month: 0  Specialty Medication: HUMIRA(CF) PEN 40 mg/0.4 mL  Patient is on additional specialty medications: No              Were doses missed due to medication being on hold? No    HUMIRA(CF) PEN 40 mg/0.4 mL injection (adalimumab)  : 0 days of medicine on hand       REFERRAL TO PHARMACIST     Referral to the pharmacist: Not needed      [facility]     Shipping address confirmed in Epic.     Patient was notified of new phone menu : No    Delivery Scheduled: Yes, Expected medication delivery date: 11/07/22.     Medication will be delivered via Same Day Courier to the prescription address in Epic WAM.    [name] Shared [facility] Specialty Technician

## 2022-11-04 NOTE — Unmapped (Signed)
The [facility] has made a second and final attempt to reach this patient to refill the following medication: Humira.      We have left voicemails on the following phone numbers: [phone] and have been unable to leave messages on the following phone numbers: [phone] .    Dates contacted: 10/31/22 & 11/04/22  Last scheduled delivery: 10/08/22 (28 day supply)    The patient may be at risk of non-compliance with this medication. The patient should call the [facility] at [phone]  Option 4, then Option 2 (all other specialty patients) to refill medication.    [name]   [facility] Specialty Technician

## 2022-11-06 MED FILL — HUMIRA PEN CITRATE FREE 40 MG/0.4 ML: SUBCUTANEOUS | 28 days supply | Qty: 4 | Fill #4

## 2022-11-06 NOTE — Unmapped (Signed)
Jerome Kennedy's shipment has been rescheduled to deliver on 11/06/22 via same day courier.

## 2022-11-06 NOTE — Unmapped (Signed)
Patient called to check on Humira. Nurse called patient however, patient unavailable. Nurse LVM that nothing is needed on Dr.Lee's end and that the medication is showing fill in progress. Patient advised to follow up with pharmacy.

## 2022-11-12 NOTE — Unmapped (Signed)
[facility]  New Patient Clinic Note    Assessment/Plan:   Jerome Kennedy is a 47 y.o.male    Problem List Items Addressed This Visit    None      Attending: Dr. Marland Kitchen    Subjective   Jerome Kennedy is a 47 y.o. male  coming to clinic today for the following issues:    No chief complaint on file.    HPI:  Establishing care, last primary care provider appointment ***    ***    ROS: ROS    I have reviewed the problem list, medications, and allergies and have updated/reconciled them if needed.    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    Myalgia     SBO (small bowel obstruction) (CMS-HCC) 2011     Past Surgical History:   Procedure Laterality Date    CHOLECYSTECTOMY      COLON SURGERY      partial resection    HERNIA REPAIR      PR CLOSE ENTEROSTOMY,RESEC+COLOREC ANAS Midline 11/01/2019    Procedure: CLO ENTEROSTOMY; W/RESECT COLORECTAL ANASTOM;  Surgeon: Claretta Fraise, MD;  Location: MAIN OR [place];  Service: Gastrointestinal    PR COLONOSCOPY FLX DX W/COLLJ SPEC WHEN PFRMD Left 01/07/2013    Procedure: COLONOSCOPY, FLEXIBLE, PROXIMAL TO SPLENIC FLEXURE; DIAGNOSTIC, W/WO COLLECTION SPECIMEN BY BRUSH OR WASH;  Surgeon: Tish Men, MD;  Location: GI PROC[facility]URES [facility];  Service: Gastroenterology    PR COLONOSCOPY FLX DX W/COLLJ SPEC WHEN PFRMD  03/09/2015    Procedure: COLONOSCOPY, FLEXIBLE, PROXIMAL TO SPLENIC FLEXURE; DIAGNOSTIC, W/WO COLLECTION SPECIMEN BY BRUSH OR WASH;  Surgeon: Cletis Athens, MD;  Location: GI PROCEDURES [facility];  Service: Gastroenterology    PR COLONOSCOPY FLX DX W/COLLJ SPEC WHEN PFRMD N/A 10/28/2019    Procedure: COLONOSCOPY, FLEXIBLE, PROXIMAL TO SPLENIC FLEXURE; DIAGNOSTIC, W/WO COLLECTION SPECIMEN BY BRUSH OR WASH;  Surgeon: Beverly Milch, MD;  Location: GI PROCEDURES [facility];  Service: Gastroenterology    PR COLONOSCOPY FLX DX W/COLLJ SPEC WHEN PFRMD N/A 10/01/2022    Procedure: COLONOSCOPY, FLEXIBLE, PROXIMAL TO SPLENIC FLEXURE; DIAGNOSTIC, W/WO COLLECTION SPECIMEN BY BRUSH OR WASH;  Surgeon: Zetta Bills, MD;  Location: GI PROCEDURES [facility];  Service: Gastroenterology    PR EXPLORATORY OF ABDOMEN Midline 09/02/2019    Procedure: EXPLORATORY LAPAROTOMY, EXPLORATORY CELIOTOMY WITH OR WITHOUT BIOPSY(S);  Surgeon: Claretta Fraise, MD;  Location: MAIN OR [facility];  Service: Gastrointestinal    PR EXPLORATORY OF ABDOMEN Midline 11/01/2019    Procedure: EXPLORATORY LAPAROTOMY, EXPLORATORY CELIOTOMY WITH OR WITHOUT BIOPSY(S);  Surgeon: Claretta Fraise, MD;  Location: MAIN OR [facility];  Service: Gastrointestinal    PR FIX FINGER,VOLAR PLATE,I-P JT Left 08/07/2022    Procedure: REPAIR AND RECONSTRUCTION, FINGER, VOLAR PLATE, INTERPHALANGEAL JOINT;  Surgeon: [name], MD;  Location: MAIN OR [facility];  Service: Orthopedics    PR FREEING BOWEL ADHESION,ENTEROLYSIS N/A 11/01/2019    Procedure: Enterolysis (  Separt Proc);  Surgeon: Claretta Fraise, MD;  Location: MAIN OR [facility];  Service: Gastrointestinal    PR ILEOSCOPY THRU STOMA,BIOPSY N/A 10/14/2019    Procedure: Karren Cobble; [place] Saa 1/MX;  Surgeon: Neysa Hotter, MD;  Location: GI PROCEDURES [facility];  Service: Gastroenterology    PR PART REMOVAL COLON W COLOSTOMY Midline 09/02/2019    Procedure: COLECTOMY, PARTIAL; WITH SKIN LEVEL CECOSTOMY OR COLOSTOMY;  Surgeon: Claretta Fraise, MD;  Location: MAIN OR [facility];  Service: Gastrointestinal    PR REPAIR INCISIONAL HERNIA,REDUCIBLE Midline 09/02/2019    Procedure: REPAIR INIT INCISIONAL OR VENTRAL HERNIA; REDUCIBLE; Surgeon: Kristopher Oppenheim, MD;  Location: MAIN OR [place];  Service: Trauma    PR REPAIR INTERCARP/CARP-METACARP JT Left 08/07/2022    Procedure: Jorene Guest;  Surgeon: [name], MD;  Location: MAIN OR [facility];  Service: Orthopedics    PR UPPER GI ENDOSCOPY,BIOPSY N/A 01/07/2013    Procedure: UGI ENDOSCOPY; WITH BIOPSY, SINGLE OR MULTIPLE;  Surgeon: Tish Men, MD;  Location: GI PROC[facility]URES [facility];  Service: Gastroenterology    PR UPPER GI ENDOSCOPY,BIOPSY N/A 10/14/2019    Procedure: UGI ENDOSCOPY; WITH BIOPSY, SINGLE OR MULTIPLE;  Surgeon: Neysa Hotter, MD;  Location: GI PROCEDURES [facility];  Service: Gastroenterology    PR UPPER GI ENDOSCOPY,DIAGNOSIS N/A 10/01/2022    Procedure: UGI ENDO, INCLUDE ESOPHAGUS, STOMACH, & DUODENUM &/OR JEJUNUM; DX W/WO COLLECTION SPECIMN, BY BRUSH OR WASH;  Surgeon: Zetta Bills, MD;  Location: GI PROCEDURES [facility]OR[facility];  Service: Gastroenterology      Allergies   Allergen Reactions    Morphine Itching and Rash     He gets light headed    Toradol [Ketorolac] Itching     Social History     Tobacco Use    Smoking status: Former     Current packs/day: 0.00     Types: Cigarettes     Quit date: 10/02/2001     Years since quitting: 21.1    Smokeless tobacco: Never    Tobacco comments:     Motivated to quit. Smokes 0.5ppd but relights each cig   Vaping Use    Vaping status: Never Used   Substance Use Topics    Alcohol use: No     Alcohol/week: 0.0 standard drinks of alcohol    Drug use: Yes     Types: Marijuana     Comment: Denies currently, once a month     Family History   Problem Relation Age of Onset    Diabetes Maternal Aunt     Crohn's disease Neg Hx        Health Maintenance   Topic Date Due    COPD Spirometry  Never done    COVID-19 Vaccine (4 - 2023-24 season) 04/04/2022    Lipid Screening  05/21/2027    DTaP/Tdap/Td Vaccines (2 - Td or Tdap) 11/24/2030    Colon Cancer Screening  10/01/2032    Pneumococcal Vaccine 0-64 Completed    Hepatitis C Screen  Completed    Influenza Vaccine  Completed       Objective     Vitals: There were no vitals taken for this visit.    Physical Exam    Labs/Imaging:  {Labs/Imaging options:52765}    Cherie Dark, MD  Family Medicine Resident PGY-1  [facility] [place] [facility]  [address] [place], [place] [address]  Telephone [phone]  Fax [phone]  [email or web address] FLEXURE; DIAGNOSTIC, W/WO  COLLECTION SPECIMEN BY BRUSH OR WASH;  Surgeon: Zetta Bills, MD;  Location: GI PROCEDURES [facility];  Service: Gastroenterology    PR EXPLORATORY OF ABDOMEN Midline 09/02/2019    Procedure: EXPLORATORY LAPAROTOMY, EXPLORATORY CELIOTOMY WITH OR WITHOUT BIOPSY(S);  Surgeon: Claretta Fraise, MD;  Location: MAIN OR [facility];  Service: Gastrointestinal    PR EXPLORATORY OF ABDOMEN Midline 11/01/2019    Procedure: EXPLORATORY LAPAROTOMY, EXPLORATORY CELIOTOMY WITH OR WITHOUT BIOPSY(S);  Surgeon: Claretta Fraise, MD;  Location: MAIN OR [facility];  Service: Gastrointestinal    PR FIX FINGER,VOLAR PLATE,I-P JT Left 08/07/2022    Procedure: REPAIR AND RECONSTRUCTION, FINGER, VOLAR PLATE, INTERPHALANGEAL JOINT;  Surgeon: [name], MD;  Location: MAIN OR [facility];  Service: Orthopedics    PR FREEING BOWEL ADHESION,ENTEROLYSIS N/A 11/01/2019    Procedure: Enterolysis (Separt Proc);  Surgeon: Claretta Fraise, MD;  Location: MAIN OR [facility];  Service: Gastrointestinal    PR ILEOSCOPY THRU STOMA,BIOPSY N/A 10/14/2019    Procedure: Karren Cobble; [place] Saa 1/MX;  Surgeon: Neysa Hotter, MD;  Location: GI PROC[facility]URES [facility];  Service: Gastroenterology    PR PART REMOVAL COLON W COLOSTOMY Midline 09/02/2019    Procedure: COLECTOMY, PARTIAL; WITH SKIN LEVEL CECOSTOMY OR COLOSTOMY;  Surgeon: Claretta Fraise, MD;  Location: MAIN OR [facility];  Service: Gastrointestinal    PR REPAIR INCISIONAL HERNIA,REDUCIBLE Midline 09/02/2019    Procedure: REPAIR INIT INCISIONAL OR VENTRAL HERNIA; REDUCIBLE;  Surgeon: Kristopher Oppenheim, MD;  Location: MAIN OR [place];  Service: Trauma    PR REPAIR INTERCARP/CARP-METACARP JT Left 08/07/2022    Procedure: Jorene Guest;  Surgeon: [name], MD;  Location: MAIN OR [facility];  Service: Orthopedics    PR UPPER GI ENDOSCOPY,BIOPSY N/A 01/07/2013    Procedure: UGI ENDOSCOPY; WITH BIOPSY, SINGLE OR MULTIPLE;  Surgeon: Tish Men, MD; Location: GI PROCEDURES [facility];  Service: Gastroenterology    PR UPPER GI ENDOSCOPY,BIOPSY N/A 10/14/2019    Procedure: UGI ENDOSCOPY; WITH BIOPSY, SINGLE OR MULTIPLE;  Surgeon: Neysa Hotter, MD;  Location: GI PROCEDURES [facility];  Service: Gastroenterology    PR UPPER GI ENDOSCOPY,DIAGNOSIS N/A 10/01/2022    Procedure: UGI ENDO, INCLUDE ESOPHAGUS, STOMACH, & DUODENUM &/OR JEJUNUM; DX W/WO COLLECTION SPECIMN, BY BRUSH OR WASH;  Surgeon: Zetta Bills, MD;  Location: GI PROCEDURES [facility];  Service: Gastroenterology      Allergies   Allergen Reactions    Morphine Itching and Rash     He gets light headed    Toradol [Ketorolac] Itching     Social History     Tobacco Use    Smoking status: Every Day     Current packs/day: 0.25     Average packs/day: 1.8 packs/day for 18.0 years (32.5 ttl pk-yrs)     Types: Cigarettes     Start date: 10/02/2001     Last attempt to quit: 10/02/2017     Passive exposure: Past    Smokeless tobacco: Never    Tobacco comments:     Motivated to quit. Smokes 0.5ppd but relights each cig   Vaping Use    Vaping status: Some Days    Substances: Nicotine    Passive vaping exposure: Yes   Substance Use Topics    Alcohol use: No     Alcohol/week: 0.0 standard drinks of alcohol    Drug use: Yes     Types: Marijuana     Comment: Smokes 2-3 joints/day     Family History   Problem Relation Age of  Onset    Breast cancer Mother     Diabetes Maternal Aunt     Colon cancer Maternal Grandmother     Prostate cancer Maternal Grandfather     Cancer Paternal Grandmother     Crohn's disease Neg Hx        Health Maintenance   Topic Date Due    COPD Spirometry  Never done    Lipid Screening  05/21/2027    DTaP/Tdap/Td Vaccines (2 - Td or Tdap) 11/24/2030    Colon Cancer Screening  10/01/2032    Pneumococcal Vaccine 0-64  Completed    Hepatitis C Screen  Completed    COVID-19 Vaccine  Completed    Influenza Vaccine  Completed       Objective     Vitals: BP 109/70 (BP Site: R Arm, BP Position: Sitting, BP Cuff Size: Large)  - Pulse 95  - Temp 36.8 ??C (98.3 ??F) (Temporal)  - Ht 175.3 cm (5' 9)  - Wt 63 kg (138 lb 12.8 oz)  - BMI 20.50 kg/m??     Physical Exam  Vitals reviewed.   Constitutional:       General: He is not in acute distress.     Appearance: Normal appearance. He is normal weight. He is not ill-appearing.   HENT:      Head: Normocephalic and atraumatic.      Right Ear: External ear normal.      Left Ear: External ear normal.      Nose: Nose normal.      Mouth/Throat:      Mouth: Mucous membranes are moist.   Eyes:      General: No scleral icterus.        Right eye: No discharge.         Left eye: No discharge.      Conjunctiva/sclera: Conjunctivae normal.   Cardiovascular:      Rate and Rhythm: Normal rate and regular rhythm.      Pulses: Normal pulses.      Heart sounds: Normal heart sounds. No murmur heard.     No friction rub. No gallop.   Pulmonary:      Effort: Pulmonary effort is normal. No respiratory distress.      Breath sounds: No stridor. Wheezing present. No rhonchi or rales.   Chest:      Chest wall: No tenderness.   Abdominal:      General: Abdomen is flat. Bowel sounds are normal. There is no distension.      Palpations: Abdomen is soft. There is no mass.      Tenderness: There is no abdominal tenderness. There is no right CVA tenderness, left CVA tenderness or guarding.      Hernia: A hernia is present.      Comments: Easily reducible ventral hernia   Musculoskeletal:         General: Normal range of motion.      Cervical back: Normal range of motion.      Right lower leg: No edema.      Left lower leg: No edema.   Skin:     General: Skin is warm and dry.   Neurological:      General: No focal deficit present.      Mental Status: He is alert and oriented to person, place, and time. Mental status is at baseline.      Sensory: Sensation is intact.      Motor: Motor function is intact. No weakness.  Psychiatric:         Mood and Affect: Mood normal.         Behavior: Behavior normal.         Labs/Imaging:  I have reviewed pertinent recent labs and imaging in Epic    Cherie Dark, MD  Family Medicine Resident PGY-1  [facility] at [facility]  [address], [place], [place] [address]  Telephone [phone]  Fax [phone]  [email or web address]

## 2022-11-13 ENCOUNTER — Ambulatory Visit: Admit: 2022-11-13 | Discharge: 2022-11-14 | Payer: MEDICAID

## 2022-11-13 MED ORDER — PANTOPRAZOLE 40 MG TABLET,DELAYED RELEASE
ORAL_TABLET | Freq: Every day | ORAL | 0 refills | 90 days | Status: CP
Start: 2022-11-13 — End: ?

## 2022-11-13 MED ORDER — ALBUTEROL SULFATE HFA 90 MCG/ACTUATION AEROSOL INHALER
Freq: Four times a day (QID) | RESPIRATORY_TRACT | 0 refills | 0 days | Status: CP | PRN
Start: 2022-11-13 — End: 2023-11-13

## 2022-11-13 MED ORDER — BUDESONIDE-FORMOTEROL HFA 80 MCG-4.5 MCG/ACTUATION AEROSOL INHALER
Freq: Two times a day (BID) | RESPIRATORY_TRACT | 2 refills | 21 days | Status: CP
Start: 2022-11-13 — End: ?

## 2022-11-13 MED ORDER — ASCORBIC ACID (VITAMIN C) 250 MG TABLET
ORAL_TABLET | ORAL | 11 refills | 60 days | Status: CP
Start: 2022-11-13 — End: ?

## 2022-11-13 MED ORDER — FLUTICASONE PROPIONATE 50 MCG/ACTUATION NASAL SPRAY,SUSPENSION
Freq: Every day | NASAL | 0 refills | 60 days | Status: CP | PRN
Start: 2022-11-13 — End: ?

## 2022-11-13 MED ORDER — CYANOCOBALAMIN (VIT B-12) 1,000 MCG SUBLINGUAL TABLET
ORAL_TABLET | Freq: Every day | SUBLINGUAL | 3 refills | 0 days | Status: CP
Start: 2022-11-13 — End: 2023-11-13

## 2022-11-13 MED ORDER — CHOLECALCIFEROL (VITAMIN D3) 25 MCG (1,000 UNIT) TABLET
ORAL_TABLET | Freq: Every day | ORAL | 0 refills | 30 days | Status: CP
Start: 2022-11-13 — End: ?

## 2022-11-13 MED ORDER — CALCIUM 200 MG (AS CALCIUM CARBONATE 500 MG) CHEWABLE TABLET
ORAL_TABLET | Freq: Two times a day (BID) | ORAL | 0 refills | 15 days | Status: CP
Start: 2022-11-13 — End: ?

## 2022-11-13 NOTE — Unmapped (Signed)
Reviewed most recent labs, Hgb 12.4, iron low, ferritin wnl.   - Continue ferrous sulfate and vitamin B12

## 2022-11-13 NOTE — Unmapped (Signed)
Doing well, about to move into his own place in the next couple of weeks. Denies any needs at this time.

## 2022-11-13 NOTE — Unmapped (Signed)
Followed by [facility], sees Psychiatry Corrie Dandy). Feels well today, no concerns. Does well with Seroquel 100 mg nightly. Has follow-up scheduled next Tuesday, 4/16. Denies SI/HI  - Will continue to monitor

## 2022-11-13 NOTE — Unmapped (Addendum)
Followed by [facility] GI. Complex surgical history. Has been fairly stable on Humira weekly, doing well with injections no issues or concerns. Does have some concern about weight fluctuations which he feels is due to his Crohns. Needs follow-up with GI. Denies abdominal pain, bloody stools, diarrhea. BM have been daily, formed, and brown. Labs reviewed from recent hospital admission.  - Encouraged to make follow-up appt with GI  - Will consider repeat labs at next visit.  - Will continue to monitor

## 2022-11-13 NOTE — Unmapped (Signed)
Provoked RUE DVT in 2011 secondary to RIJ line. Completed treatment anticoagulation. No current anticoagulation due to concern for GI bleeding.  - CTM

## 2022-11-13 NOTE — Unmapped (Signed)
Prior 2 ppd smoker, quit in 01/27/18. Restarted smoking in Jan 27, 2021 after father passed away while he was in the hospital for Crohn's flare and issues. Has been able to wean down to 3 cigarettes/day, and is using nicotine patch. Also has daily marijuana use as both pain control and anxiety control. Is open to further intervention.  - Referral to Tobacco Cessation Program  - Encouraged to work with Psychiatry on better anxiety control as is also interested in cutting down marijuana use.  - Will continue to discuss

## 2022-11-13 NOTE — Unmapped (Addendum)
Patient reports diagnosis during hospitalization ~2022, does believe he had lung function tests at that time that supported the diagnosis. Does not recall ever seeing Pulmonology. Denies SOB at this time, but does have mild wheeze and dry cough. Has been stable on Symbicort and albuterol as needed, but ran out of both inhalers a couple weeks ago. On exam has mild expiratory wheeze throughout.  - Continue Symbicort and albuterol PRN  - Can consider repeat PFTs in future if unable to verify prior

## 2022-11-14 NOTE — Unmapped (Signed)
I was immediately available via phone/pager or present on site.  I reviewed and discussed the case with the resident, but did not see the patient.  I agree with the assessment and plan as documented in the resident's note. Miguel Rota, MD

## 2022-11-19 NOTE — Unmapped (Signed)
Change in plan, patient will continue humira per Dr. [name]

## 2022-11-27 ENCOUNTER — Ambulatory Visit: Admit: 2022-11-27 | Discharge: 2022-11-29 | Payer: MEDICAID

## 2022-11-27 LAB — CBC W/ AUTO DIFF
BASOPHILS ABSOLUTE COUNT: 0 10*9/L (ref 0.0–0.1)
BASOPHILS RELATIVE PERCENT: 0.5 %
EOSINOPHILS ABSOLUTE COUNT: 0.2 10*9/L (ref 0.0–0.5)
EOSINOPHILS RELATIVE PERCENT: 2.7 %
HEMATOCRIT: 38.1 % — ABNORMAL LOW (ref 39.0–48.0)
HEMOGLOBIN: 12.1 g/dL — ABNORMAL LOW (ref 12.9–16.5)
LYMPHOCYTES ABSOLUTE COUNT: 3 10*9/L (ref 1.1–3.6)
LYMPHOCYTES RELATIVE PERCENT: 45.5 %
MEAN CORPUSCULAR HEMOGLOBIN CONC: 31.7 g/dL — ABNORMAL LOW (ref 32.0–36.0)
MEAN CORPUSCULAR HEMOGLOBIN: 26.7 pg (ref 25.9–32.4)
MEAN CORPUSCULAR VOLUME: 84.2 fL (ref 77.6–95.7)
MEAN PLATELET VOLUME: 7.9 fL (ref 6.8–10.7)
MONOCYTES ABSOLUTE COUNT: 0.5 10*9/L (ref 0.3–0.8)
MONOCYTES RELATIVE PERCENT: 8 %
NEUTROPHILS ABSOLUTE COUNT: 2.9 10*9/L (ref 1.8–7.8)
NEUTROPHILS RELATIVE PERCENT: 43.3 %
PLATELET COUNT: 254 10*9/L (ref 150–450)
RED BLOOD CELL COUNT: 4.52 10*12/L (ref 4.26–5.60)
RED CELL DISTRIBUTION WIDTH: 15.6 % — ABNORMAL HIGH (ref 12.2–15.2)
WBC ADJUSTED: 6.6 10*9/L (ref 3.6–11.2)

## 2022-11-27 LAB — COMPREHENSIVE METABOLIC PANEL
ALBUMIN: 2.5 g/dL — ABNORMAL LOW (ref 3.4–5.0)
ALKALINE PHOSPHATASE: 124 U/L — ABNORMAL HIGH (ref 46–116)
ALT (SGPT): 9 U/L — ABNORMAL LOW (ref 10–49)
ANION GAP: 7 mmol/L (ref 5–14)
AST (SGOT): 15 U/L (ref <=34)
BILIRUBIN TOTAL: 0.2 mg/dL — ABNORMAL LOW (ref 0.3–1.2)
BLOOD UREA NITROGEN: 10 mg/dL (ref 9–23)
BUN / CREAT RATIO: 11
CALCIUM: 8.6 mg/dL — ABNORMAL LOW (ref 8.7–10.4)
CHLORIDE: 114 mmol/L — ABNORMAL HIGH (ref 98–107)
CO2: 20 mmol/L (ref 20.0–31.0)
CREATININE: 0.9 mg/dL
EGFR CKD-EPI (2021) MALE: >90 mL/min/{1.73_m2} (ref >=60)
GLUCOSE RANDOM: 78 mg/dL (ref 70–179)
POTASSIUM: 4.2 mmol/L (ref 3.4–4.8)
PROTEIN TOTAL: 5.7 g/dL (ref 5.7–8.2)
SODIUM: 141 mmol/L (ref 135–145)

## 2022-11-27 LAB — URINALYSIS WITH MICROSCOPY WITH CULTURE REFLEX
BACTERIA: NONE SEEN /HPF
BILIRUBIN UA: NEGATIVE
BLOOD UA: NEGATIVE
GLUCOSE UA: NEGATIVE
KETONES UA: NEGATIVE
LEUKOCYTE ESTERASE UA: NEGATIVE
NITRITE UA: NEGATIVE
PH UA: 6.5 (ref 5.0–9.0)
PROTEIN UA: NEGATIVE
RBC UA: 1 /HPF (ref <=3)
SPECIFIC GRAVITY UA: 1.039 — ABNORMAL HIGH (ref 1.003–1.030)
SQUAMOUS EPITHELIAL: 3 /HPF (ref 0–5)
UROBILINOGEN UA: <2
WBC UA: 2 /HPF (ref <=2)

## 2022-11-27 LAB — LIPASE: LIPASE: 43 U/L (ref 12–53)

## 2022-11-27 LAB — C-REACTIVE PROTEIN: C-REACTIVE PROTEIN: 22 mg/L — ABNORMAL HIGH (ref <=10.0)

## 2022-11-27 LAB — SEDIMENTATION RATE: ERYTHROCYTE SEDIMENTATION RATE: 23 mm/h — ABNORMAL HIGH (ref 0–15)

## 2022-11-27 MED ADMIN — HYDROmorphone (PF) (DILAUDID) injection 1 mg: 1 mg | INTRAVENOUS | @ 10:00:00 | Stop: 2022-11-27

## 2022-11-27 MED ADMIN — iohexol (OMNIPAQUE) 350 mg iodine/mL solution 100 mL: 100 mL | INTRAVENOUS | @ 12:00:00 | Stop: 2022-11-27

## 2022-11-27 MED ADMIN — pantoprazole (Protonix) injection 40 mg: 40 mg | INTRAVENOUS | @ 09:00:00 | Stop: 2022-11-27

## 2022-11-27 MED ADMIN — HYDROmorphone (PF) (DILAUDID) injection 1 mg: 1 mg | INTRAVENOUS | @ 09:00:00 | Stop: 2022-11-27

## 2022-11-27 MED ADMIN — HYDROmorphone (PF) injection Syrg 0.5 mg: 0.5 mg | INTRAVENOUS | @ 12:00:00 | Stop: 2022-11-27

## 2022-11-27 MED ADMIN — iohexol (OMNIPAQUE) 350 mg iodine/mL solution 150 mL: 150 mL | INTRAVENOUS | @ 18:00:00 | Stop: 2022-11-27

## 2022-11-27 MED ADMIN — promethazine (PHENERGAN) 12.5 mg in sodium chloride (NS) 0.9 % 25 mL infusion: 12.5 mg | INTRAVENOUS | @ 13:00:00 | Stop: 2022-11-27

## 2022-11-27 MED ADMIN — lactated ringers bolus 1,000 mL: 1000 mL | INTRAVENOUS | @ 09:00:00 | Stop: 2022-11-27

## 2022-11-27 MED ADMIN — HYDROmorphone (PF) injection Syrg 0.5 mg: 0.5 mg | INTRAVENOUS | @ 15:00:00 | Stop: 2022-11-27

## 2022-11-27 MED ADMIN — ondansetron (ZOFRAN) injection 4 mg: 4 mg | INTRAVENOUS | @ 09:00:00 | Stop: 2022-11-27

## 2022-11-27 NOTE — Unmapped (Signed)
[facility] Refill Coordination Note    Specialty Medication(s) to be Shipped:   Inflammatory Disorders: Humira    Other medication(s) to be shipped: No additional medications requested for fill at this time     Jerome Kennedy., DOB: [birth year]  Phone: [phone] (home)       All above HIPAA information was verified with patient.     Was a Nurse, learning disability used for this call? No    Completed refill call assessment today to schedule patient's medication shipment from the [facility] [phone]).  All relevant notes have been reviewed.     Specialty medication(s) and dose(s) confirmed: Regimen is correct and unchanged.   Changes to medications: Jerome Kennedy reports no changes at this time.  Changes to insurance: No  New side effects reported not previously addressed with a pharmacist or physician: None reported  Questions for the pharmacist: No    Confirmed patient received a Conservation officer, historic buildings and a Surveyor, mining with first shipment. The patient will receive a drug information handout for each medication shipped and additional FDA Medication Guides as required.       DISEASE/MEDICATION-SPECIFIC INFORMATION        For patients on injectable medications: Patient currently has 1 doses left.  Next injection is scheduled for this week.    SPECIALTY MEDICATION ADHERENCE     Medication Adherence    Patient reported X missed doses in the last month: 0  Specialty Medication: HUMIRA(CF) PEN 40 mg/0.4 mL  Patient is on additional specialty medications: No              Were doses missed due to medication being on hold? No    Humira 40/0.4 mg/ml: 4 days of medicine on hand        REFERRAL TO PHARMACIST     Referral to the pharmacist: Not needed      [facility]     Shipping address confirmed in Epic.       Delivery Scheduled: Yes, Expected medication delivery date: 12/02/22.     Medication will be delivered via Same Day Courier to the prescription address in Epic WAM.    [name]   [facility] Specialty Technician

## 2022-11-27 NOTE — Unmapped (Signed)
Inpatient Tobacco Cessation Counseling Note    This medical encounter was conducted virtually using [email or web address]  TeleHealth protocols.    I have identified myself to the patient and conveyed my credentials to Jerome Kennedy  I have explained the capabilities and limitations of telemedicine and the patient/proxy and myself both agree that it is appropriate for their current circumstances/symptoms.     Contact Information  Person Contacted: Jerome Kennedy.         Contact Phone number: [phone]      Phone Outcome: Spoke with pt  Is there someone else in the room? No.     Patient's location at the time of the telephone visit: Hospitalized at [facility]   Provider's location at the time of the telephone visit: At home, in [place]      Purpose of contact:     Pt participated in a telephone visit for tobacco cessation counseling.  Patient was admitted to hospital for No admission diagnoses are documented for this encounter.. Patient consented to telephone visit given due to social isolation measures in place due to the COVID-19 pandemic.     Tobacco Use History and Assessment  Time Since Last Tobacco Use: 1 to 7 days ago  Tobacco Withdrawal (Past 24 Hours): None noted  Type of Tobacco Products Used: Cigarettes  Quantity Used: 5  Quantity Per: day  Relight Cigarettes: Yes  Age Began Use (Years Old): 15  Brand of Tobacco Used: [place]  Menthol: Yes  Longest Time Without Tobacco: Months  # of Hours, Days, Weeks, Months or Years: 3  Most Recent Attempt: 6-10 years  Medications Used in Past Attempts: Nicotine Patch, Nicotine Gum, Nicotine Lozenge  Previously seen by NDP?: Hospital Inpatient    Behavioral Assessment  Why Uses: 1. habit 2. stress-relief  Reasons to Become Tobacco Free: 1. finances- wants to save money 2. health  Barriers/Challenges: 1. long-standing habit 2. stress 3. cravings 4. unstable housing  Strategies: 1. pt can use NRT to manage cravings 2. pt can use hand to mouth replacements/spacing strategies to continue reducing use    NOTE: Pt reports smoking 5cpd, noting he has been working on reducing his use as his goal is to quit. He cites his finances and his health as motivation to stop smoking. Pt states he stopped using marijuana two weeks ago and hopes tobacco is next. He has been using nicotine patches at home and finds them helpful. Pt is receptive to using patches and gum while admitted to assist with cravings. We discussed strategies for managing urges and triggers. SW provided encouragement and education on how cessation aids in Crohn's management. SW provided pt with contact information, physical improvements related to tobacco cessation, and available resources (including outpt Tobacco Treatment Program at [facility] and [facility]).    Treatment Plan  Please see below for medication recommendations in bold.   Cessation Meds Currently Using: None  Medications Recommended During Hospitalization: Patch 14mg , Gum 4mg   Outpatient/Discharge Medications Recommended: Patch 14mg , Gum 4mg   Plan to Obtain Outpatient Meds: TTS messaged providers for Rx  Patient's Plan Post Discharge/Visit: Plan to quit as soon as possible      As part of this Telephone Visit, no in-person exam was conducted.     I personally spent 12 minutes counseling the patient via telephone about tobacco cessation.  I spent an additional 11 minutes on pre- and post-visit activities.      The patient was physically located in [place] or  a state in which I am permitted to provide care. The patient and/or parent/guardian understood that s/he may incur co-pays and cost sharing, and agreed to the telemedicine visit. The visit was reasonable and appropriate under the circumstances given the patient's presentation at the time.     The patient and/or parent/guardian has been advised of the potential risks and limitations of this mode of treatment (including, but not limited to, the absence of in-person examination) and has agreed to be treated using telemedicine. The patient's/patient's family's questions regarding telemedicine have been answered.      If the visit was completed in an ambulatory setting, the patient and/or parent/guardian has also been advised to contact their provider???s office for worsening conditions, and seek emergency medical treatment and/or call 911 if the patient deems either necessary.     Visit Format/Coding: Telephone     Coding: 16109 (11-20 minutes)  Service rendered over the phone most consistent with: Tobacco cessation counseling, greater than 10 minutes (60454)     Sara Chu, LCSW, LCAS, NCTTP  Clinical Social Worker / Tobacco Treatment Specialist  Tobacco Treatment Program  [facility]  phone: [phone]  pager: [phone]

## 2022-11-27 NOTE — Unmapped (Signed)
[facility]  Emergency Department Provider Note     ED Clinical Impression     Final diagnoses:   Abdominal pain, unspecified abdominal location (Primary)      HPI, Medical Decision Making, ED Course     HPI: 47 y.o. male with past medical history of Crohn's disease on Humira, IDA, history of RUE DVT, COPD, bipolar disorder, unstable housing, who presents to the emergency department with concerns for 2 days of abdominal pain, nonbloody diarrhea, decreased p.o. intake, and multiple episodes of emesis with small amount of blood streaks.  Patient reports that he has history of Crohn's disease and that these symptoms feel similar to previous Crohn's flares.  He describes the abdominal pain as diffuse and crampy.  He denies any blood in his stool though notes that after vomiting multiple times, he has had small amount of blood streaked in his vomit.    On arrival to the emergency department, patient with stable vitals.  He is uncomfortable appearing.  On arrival to the emergency department, physical exam is notable for mild diffuse abdominal tenderness to palpation, large reducible ventral hernia.    DDx/MDM: Suspect most likely Crohn's flare, also considered appendicitis, diverticulitis, C. difficile infection, amongst multiple other etiologies.  Will plan for CBC, CMP, lipase, urinalysis, CT abdomen pelvis given diffuse abdominal tenderness to palpation, as well as GI pathogen panel and C. difficile studies.  Plan for IV antiemetics, PPI, analgesia, and fluids.  Disposition pending results of workup.    ED Course  ED Course as of 11/27/22 0640   Thu Nov 27, 2022   0543 CBC with mild anemia at 12.1, stable/at baseline.   0543 ESR slightly elevated to 23, CRP also slightly elevated 22.   0543 Lipase: 43  Lipase within normal limits.   0981 Signed out to oncoming resident pending CT scan and clinical reevaluation.         Independent Interpretation of Studies: If applicable, documented in ED course above.  I have reviewed recent and relevant previous record, including: Inpatient notes - most recent discharge summary from 10/29/2022 to review past medical          ____________________________________________    The case was discussed with the attending physician, who is in agreement with the above assessment and plan.     Additional History Elements     Chief Complaint  Chief Complaint   Patient presents with    Abdominal Pain         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    Myalgia     Rhinovirus infection     SBO (small bowel obstruction) (CMS-HCC) 2011       Past Surgical History:   Procedure Laterality Date    CHOLECYSTECTOMY      COLON SURGERY      partial resection    HERNIA REPAIR      PR CLOSE ENTEROSTOMY,RESEC+COLOREC ANAS Midline 11/01/2019    Procedure:  CLO ENTEROSTOMY; W/RESECT COLORECTAL ANASTOM;  Surgeon: Claretta Fraise, MD;  Location: MAIN OR [place];  Service: Gastrointestinal    PR COLONOSCOPY FLX DX W/COLLJ SPEC WHEN PFRMD Left 01/07/2013    Procedure: COLONOSCOPY, FLEXIBLE, PROXIMAL TO SPLENIC FLEXURE; DIAGNOSTIC, W/WO COLLECTION SPECIMEN BY BRUSH OR WASH;  Surgeon: Tish Men, MD;  Location: GI [facility]URES [facility];  Service: Gastroenterology    PR COLONOSCOPY FLX DX W/COLLJ SPEC WHEN PFRMD  03/09/2015    Procedure: COLONOSCOPY, FLEXIBLE, PROXIMAL TO SPLENIC FLEXURE; DIAGNOSTIC, W/WO COLLECTION SPECIMEN BY BRUSH OR WASH;  Surgeon: Cletis Athens, MD;  Location: GI PROCEDURES [facility];  Service: Gastroenterology    PR COLONOSCOPY FLX DX W/COLLJ SPEC WHEN PFRMD N/A 10/28/2019    Procedure: COLONOSCOPY, FLEXIBLE, PROXIMAL TO SPLENIC FLEXURE; DIAGNOSTIC, W/WO COLLECTION SPECIMEN BY BRUSH OR WASH;  Surgeon: Beverly Milch, MD;  Location: GI PROCEDURES [facility];  Service: Gastroenterology    PR COLONOSCOPY FLX DX W/COLLJ SPEC WHEN PFRMD N/A 10/01/2022    Procedure: COLONOSCOPY, FLEXIBLE, PROXIMAL TO SPLENIC FLEXURE; DIAGNOSTIC, W/WO COLLECTION SPECIMEN BY BRUSH OR WASH;  Surgeon: Zetta Bills, MD;  Location: GI PROCEDURES [facility];  Service: Gastroenterology    PR EXPLORATORY OF ABDOMEN Midline 09/02/2019    Procedure: EXPLORATORY LAPAROTOMY, EXPLORATORY CELIOTOMY WITH OR WITHOUT BIOPSY(S);  Surgeon: Claretta Fraise, MD;  Location: MAIN OR [facility];  Service: Gastrointestinal    PR EXPLORATORY OF ABDOMEN Midline 11/01/2019    Procedure: EXPLORATORY LAPAROTOMY, EXPLORATORY CELIOTOMY WITH OR WITHOUT BIOPSY(S);  Surgeon: Claretta Fraise, MD;  Location: MAIN OR [facility];  Service: Gastrointestinal    PR FIX FINGER,VOLAR PLATE,I-P JT Left 08/07/2022    Procedure: REPAIR AND RECONSTRUCTION, FINGER, VOLAR PLATE, INTERPHALANGEAL JOINT;  Surgeon: [name], MD;  Location: MAIN OR [facility];  Service: Orthopedics    PR FREEING BOWEL ADHESION,ENTEROLYSIS N/A 11/01/2019    Procedure: Enterolysis (Separt Proc);  Surgeon: Claretta Fraise, MD;  Location: MAIN OR [facility];  Service: Gastrointestinal    PR ILEOSCOPY THRU STOMA,BIOPSY N/A 10/14/2019    Procedure: Karren Cobble; [place] Saa 1/MX;  Surgeon: Neysa Hotter, MD;  Location: GI PROCEDURES [facility];  Service: Gastroenterology    PR PART REMOVAL COLON W COLOSTOMY Midline 09/02/2019    Procedure: COLECTOMY, PARTIAL; WITH SKIN LEVEL CECOSTOMY OR COLOSTOMY;  Surgeon: Claretta Fraise, MD;  Location: MAIN OR [facility];  Service: Gastrointestinal    PR REPAIR INCISIONAL HERNIA,REDUCIBLE Midline 09/02/2019    Procedure: REPAIR INIT INCISIONAL OR VENTRAL HERNIA; REDUCIBLE;  Surgeon: Kristopher Oppenheim, MD;  Location: MAIN OR [place];  Service: Trauma    PR REPAIR INTERCARP/CARP-METACARP JT Left 08/07/2022    Procedure: Jorene Guest;  Surgeon: [name], MD;  Location: MAIN OR [facility];  Service: Orthopedics    PR UPPER GI ENDOSCOPY,BIOPSY N/A 01/07/2013    Procedure: UGI ENDOSCOPY; WITH BIOPSY, SINGLE OR MULTIPLE;  Surgeon: Tish Men, MD;  Location: GI PROC[facility]URES [facility];  Service: Gastroenterology    PR UPPER GI ENDOSCOPY,BIOPSY N/A 10/14/2019    Procedure: UGI ENDOSCOPY; WITH BIOPSY, SINGLE OR MULTIPLE;  Surgeon: Neysa Hotter, MD;  Location: GI PROCEDURES [facility];  Service: Gastroenterology    PR UPPER GI ENDOSCOPY,DIAGNOSIS N/A 10/01/2022    Procedure: UGI ENDO, INCLUDE ESOPHAGUS, STOMACH, & DUODENUM &/OR JEJUNUM; DX W/WO COLLECTION SPECIMN, BY BRUSH OR WASH;  Surgeon: Zetta Bills, MD;  Location: GI PROCEDURES [facility];  Service: Gastroenterology       Allergies  Morphine and Toradol [ketorolac]    Family History  Family History   Problem Relation Age of Onset    Breast cancer Mother     Diabetes Maternal Aunt     Colon cancer Maternal Grandmother     Prostate cancer Maternal Grandfather     Cancer Paternal Grandmother     Crohn's disease Neg Hx        Social History  Social History     Tobacco Use    Smoking status: Every Day     Current packs/day: 0.25     Average packs/day: 1.8 packs/day for 18.1 years (32.5 ttl pk-yrs)     Types: Cigarettes     Start date: 10/02/2001     Last attempt to quit: 10/02/2017     Passive exposure: Past    Smokeless tobacco: Never    Tobacco comments:     Motivated to quit. Smokes 0.5ppd but relights each cig   Vaping Use    Vaping status: Some Days    Substances: Nicotine    Passive vaping exposure: Yes   Substance Use Topics    Alcohol use: No     Alcohol/week: 0.0 standard drinks of alcohol    Drug use: Yes     Types: Marijuana     Comment: Smokes 2-3 joints/day        Physical Exam     VITAL SIGNS:      Vitals: 11/27/22 0431 11/27/22 0437 11/27/22 0441   BP:  103/77    Pulse: 82 87 78   Resp:  26 19   Temp:  36.6 ??C (97.9 ??F) 36.4 ??C (97.5 ??F)   TempSrc:  Axillary Axillary   SpO2: 99% 100% 100%     Constitutional: Alert and oriented. No acute distress.  Eyes: Conjunctivae are normal.  HEENT: Normocephalic and atraumatic. Conjunctivae clear. No congestion. Moist mucous membranes.   Cardiovascular: Rate as above, regular rhythm. Normal and symmetric distal pulses. Brisk capillary refill. Normal skin turgor.  Respiratory: Normal respiratory effort. Breath sounds are normal. There are no wheezing or crackles heard.  Gastrointestinal: Soft, non-distended, mild diffuse abdominal tenderness to palpation, large reducible ventral hernia.  Genitourinary: Deferred.  Musculoskeletal: Non-tender with normal range of motion in all extremities.  Neurologic: Normal speech and language. No gross focal neurologic deficits are appreciated. Patient is moving all extremities equally, face is symmetric at rest and with speech.  Skin: Skin is warm, dry and intact. No rash noted.  Psychiatric: Mood and affect are normal. Speech and behavior are normal.     Radiology     CT Abdomen Pelvis W IV Contrast Only    (Results Pending)       Pertinent labs & imaging results that were available during my care of the patient were independently interpreted by me and considered in my medical decision making (see chart for details).    Portions of this record have been created using Scientist, clinical (histocompatibility and immunogenetics). Dictation errors have been sought, but may not have been identified and corrected.         Jesse Fall, MD  Resident  11/27/22 [phone]

## 2022-11-27 NOTE — Unmapped (Signed)
Physician Discharge Summary    Admit date: 11/27/2022    Discharge date and time: 11/27/22    Discharge to: AMA    Discharge Service: Med Bernita Raisin [facility])    Discharge Attending Physician: Herby Abraham, MD    Discharge Diagnoses: Abdominal pain, h/o Crohn's disease    Procedures: None    Pertinent Test Results: CT abd/pelvis: Mucosal enhancement and wall thickening of the neoterminal ileum, decreased compared to prior. 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 Dedicated CT of the chest is suggested to further characterize, and can also assess new bilateral lower lobe endobronchial filling, which may be due to aspiration and/or mucous plugging.    Hospital Course:  Please see same day H and P. 47 yo M w/ a history of Crohn's diseases s/p ileocecectomy, R hemicolectomy, bipolar disorder, h/o DVT, COPD, tobacco abuse, homelessness, p/w RLQ abdominal pain, nausea, diarrhea, hematemesis, several recent admissions and ED visits for the same. Unclear if he missed any doses of Humira as his history was inconsistent. CT abd/pelvis showed no acute findings, H/H stable, CRP elevated. GI was consulted. While in the ED, the patient became belligerent towards staff about pain medication. He refused endoscopy. He walked out of the hospital AMA prior to speaking to the primary team. Pt's outpatient GI provider will be notified to arranged ambulatory follow-up.     Condition at Discharge: good  Discharge Medications:      Your Medication List        ASK your doctor about these medications      albuterol 90 mcg/actuation inhaler  Commonly known as: PROVENTIL HFA;VENTOLIN HFA  Inhale 2 puffs every six (6) hours as needed for wheezing.     ascorbic acid (vitamin C) 250 MG tablet  Commonly known as: VITAMIN C  Take 1 tablet (250 mg total) by mouth every other day. Take with oral iron budesonide-formoterol 80-4.5 mcg/actuation inhaler  Commonly known as: SYMBICORT  Inhale 2 puffs  in the morning and 2 puffs in the evening.     calcium carbonate 200 mg calcium (500 mg) chewable tablet  Commonly known as: TUMS  Chew 2 tablets (400 mg of elem calcium total) two (2) times a day.     cholecalciferol (vitamin D3 25 mcg (1,000 units)) 1,000 unit (25 mcg) tablet  Take 1 tablet (25 mcg total) by mouth daily.     cyanocobalamin (vitamin B-12) 1,000 mcg Subl  Place 2,000 mcg under the tongue in the morning.     empty container Misc  Use as directed to dispose of Humira     ferrous sulfate 325 (65 FE) MG tablet  Take 1 tablet (325 mg total) by mouth every other day.     fluticasone propionate 50 mcg/actuation nasal spray  Commonly known as: FLONASE  2 sprays into each nostril daily as needed for allergies.     HUMIRA(CF) PEN 40 mg/0.4 mL injection  Generic drug: adalimumab  Inject the contents of 1 pen (40 mg total) under the skin every seven (7) days.     magnesium oxide 400 mg (241.3 mg elemental) tablet  Commonly known as: MAG-OX  Take 1 tablet (400 mg total) by mouth two (2) times a day.     nicotine 21 mg/24 hr patch  Commonly known as: NICODERM CQ  Place 1 patch on the skin daily.     OPTICHAMBER DIAMOND [facility]  Generic  drug: inhalational spacing device     oxyCODONE 5 MG immediate release tablet  Commonly known as: ROXICODONE  Take 1 tablet (5 mg total) by mouth daily as needed for pain.     pantoprazole 40 MG tablet  Commonly known as: Protonix  Take 1 tablet (40 mg total) by mouth daily.     QUEtiapine 100 MG tablet  Commonly known as: SEROquel  Take 1 tablet (100 mg total) by mouth nightly.              Pending Test Results:  None     Discharge Instructions:   None provided, pt left AMA.        I spent greater than 30 minutes in the discharge of this patient.

## 2022-11-27 NOTE — Unmapped (Signed)
I received patient care from Dr. Denzil Magnuson at shift change. I reviewed the patient records and evaluated the patient on my arrival.    Vitals:    11/27/22 0700   BP: 107/74   Pulse: 88   Resp: 23   Temp: 36.5 ??C (97.7 ??F)   SpO2: 97%       Illness Severity: stable  Patient Summary: Jerome Kennedy. is a 47 y.o. male who presented to the emergency department with acute on chronic abdominal pain and nausea.  History of Crohn's.  Pending CT scan, symptomatic control, reassessment.  Action List:     ED Course:   ED Course as of 11/27/22 1104   Thu Nov 27, 2022   1610 Patient was sleepy, so we are concerned about giving him Dilaudid initially however now he is awake, complaining of worsening pain, nausea.  He is frustrated with Zofran and Tylenol he says that these do not help and the only thing that help are Dilaudid and Phenergan.   1002 Reassessed patient.  He reported some symptomatic improvement.  Updated with the results of his CT abdomen.  Said he would like to try and p.o. challenge as he is feeling a bit better   1104 Patient was p.o. challenged.  He is not able to tolerate p.o., had worsening pain and nausea.  Will repeat dose of Dilaudid, page for admission.  CT chest still pending.       Disclaimer: This note was created using Data processing manager and may contain occasional errors in spelling, grammar, and erroneous words. Such errors have been sought, but not all may have been identified and corrected.

## 2022-11-27 NOTE — Unmapped (Signed)
[facility] Medicine   History and Physical       Assessment and Plan     Jerome Kennedy. is a 47 y.o. male who is presenting to [facility] with Abdominal pain, in the setting of the following pertinent/contributing co-morbidities: Crohn's disease, COPD, bipolar disorder.      Abdominal pain   H/o Crohn's disease  Chronic illness with severe exacerbation or progression that has a significant risk of morbidity without appropriate treatment  CT is reassuring, ESR/CRP elevated. Unclear if current symptoms are due to Crohn's disease. H/o C diff colitis in 2023.   -GI consulted, appreciate recommendations.  -Follow-up C diff, GIPP.   -Clear liquid diet, advance as tolerated.   -Tylenol, oxycodone 5mg  q6h prn severe pain.     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 CT abd/pelvis notable for stable extensive variceal/collateralization of the abdomen which may reflect SVC obstruction. 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   -CT chest ordered.     Secondary/Additional Active Problems:  COPD  Reports he never received Symbicort, using albuterol twice daily. He was placed on O2 in the ED.   -Start Breo in place of Symbicort.  -Albuterol prn.   -Wean O2 as tolerated.  -CT chest as noted above.     Bipolar disorder  -Continue seroquel.     Homelessness  -SW consult.     Iron deficiency anemia  -Continue oral iron supplementation.     Prophylaxis  -Lovenox     Diet  -No diet orders on file    Code Status / HCDM  -Full Code, Unverified but presumed, based on previous history   -  HCDM (patient stated preference): Jerome Kennedy - Mother - [phone]    Significant Comorbid Conditions:     -Anemia POA requiring further investigation or monitoring  -Complex social situation/SDOH requiring consultation and support of Care Management     Issues Impacting Complexity of Management:      Medical Decision Making: Reviewed records from the following unique sources  [facility] and [facility] records.    I personally spent greater than 75 minutes face-to-face and non-face-to-face in the care of this patient, which includes all pre, intra, and post visit time on the date of service.  All documented time was specific to the E/M visit and does not include any procedures that may have been performed.    HPI      Jerome Amarante. is a 47 y.o. male with a history of Crohn's disease s/p ileocecectomy, right hemicolectomy, complicated by hernias, short gut syndrome, SBO, iron deficiency anemia, h/o RUE DVT, bipolar disorder, COPD, tobacco abuse, homelessness who is presenting to [facility] with Abdominal pain. Pain has been progressively worsening for 2 days, sharp, RLQ, with associated diarrhea, nausea, vomiting and hematemesis. He reports this is similar to what he has experienced with prior Crohn's flares. He reports running out of Humira, but says he only missed 1 dose last week. He says his pain is worse because he ran out of oxycodone. He has had several ED visits this year for the same symptoms, most recently admitted to [facility] 10/28/22-10/29/22. CT abd/pelvis showed no acute findings and he improved with IV fluids and pain control. He was also admitted 09/28/22-10/01/22 for the same, colonoscopy was performed during that admission  with poor visualization due to poor prep.     In the ED, he received protonix 40mg  IV,, dilaudid 1mg  IV x 2, 0.5mg  IV x 2, zofran 4mg  IV x 2, phenergan 12.5mg  IV x 1, Tylenol, 1L LR. Labs notable for stable Hgb at 12.1, ESR elevated at 23, CRP 22. CT abd/pelvis showed stable if not decreased mucosal enhancement neoterminal ileum and stable extensive variceal/collateralization of the abdomen which may reflect superior vena caval obstruction, CT chest recommended. He was unable to tolerate po prompting recommendation for admission.     Med Rec Confidence   I reviewed the Medication List. The current list is Accurate    Physical Exam   Temp:  [36.4 ??C (97.5 ??F)-36.6 ??C (97.9 ??F)] 36.5 ??C (97.7 ??F)  Heart Rate:  [71-92] 72  SpO2 Pulse:  [71-91] 85  Resp:  [8-26] 14  BP: (103-113)/(74-81) 107/74  SpO2:  [90 %-100 %] 96 %  There is no height or weight on file to calculate BMI.  GEN: No acute distress  EYES: PERRL, no scleral icterus  ENT: MMM, no JVD  CV: RRR, no M/R/G   PULM: CTAB  ABD: Soft, multiple abdominal scars with reducible ventral hernia noted, tender to deep palpation diffusely, worse RLQ, no distension,  +BS  EXT: No edema  NEURO: No focal deficits  PSYCH: A+Ox3, appropriate  GU: No CVA tenderness  DERM: No rashes or bruises        ___________________________________________________________________    Medications     Prior to Admission medications    Medication Dose, Route, Frequency   ADALIMUMAB PEN CITRATE FREE 40 MG/0.4 ML Inject the contents of 1 pen (40 mg total) under the skin every seven (7) days.   albuterol HFA 90 mcg/actuation inhaler 2 puffs, Inhalation, Every 6 hours PRN   ascorbic acid, vitamin C, (VITAMIN C) 250 MG tablet 250 mg, Oral, Every other day, Take with oral iron   budesonide-formoterol (SYMBICORT) 80-4.5 mcg/actuation inhaler 2 puffs, Inhalation, 2 times daily (RT)   calcium carbonate (TUMS) 200 mg calcium (500 mg) chewable tablet 2 tablets, Oral, 2 times a day (standard)   cholecalciferol, vitamin D3 25 mcg, 1,000 units,, 1,000 unit (25 mcg) tablet 25 mcg, Oral, Daily (standard)   cyanocobalamin, vitamin B-12, 1,000 mcg Subl 2,000 mcg, Sublingual, Daily   empty container Misc Use as directed to dispose of Humira   ferrous sulfate 325 (65 FE) MG tablet 325 mg, Oral, Every other day   fluticasone propionate (FLONASE) 50 mcg/actuation nasal spray 2 sprays, Each Nare, Daily PRN   magnesium oxide (MAG-OX) 400 mg (241.3 mg elemental magnesium) tablet 400 mg, Oral, 2 times a day (standard)   nicotine (NICODERM CQ) 21 mg/24 hr patch 1 patch, Transdermal, Daily (standard)   OPTICHAMBER DIAMOND VHC Spcr    oxyCODONE (ROXICODONE) 5 MG immediate release tablet 5 mg, Oral, Daily PRN   pantoprazole (PROTONIX) 40 MG tablet 40 mg, Oral, Daily (standard)   QUEtiapine (SEROQUEL) 100 MG tablet Take 1 tablet (100 mg total) by mouth nightly.       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 He started smoking about 21 years ago. He has a 10.2 pack-year smoking history. He has been exposed to tobacco smoke. He has never used smokeless tobacco.  Alcohol use:   reports no history of alcohol use.  Drug use:  reports current drug use. Drug: Marijuana.    Family History     Family History   Problem Relation Age of Onset    Breast cancer Mother     Diabetes Maternal Aunt     Colon cancer Maternal Grandmother     Prostate cancer Maternal Grandfather     Cancer Paternal Grandmother     Crohn's disease Neg Hx        Surgical History     Past Surgical History:   Procedure Laterality Date    CHOLECYSTECTOMY      COLON SURGERY      partial resection    HERNIA REPAIR      PR CLOSE ENTEROSTOMY,RESEC+COLOREC ANAS Midline 11/01/2019    Procedure: CLO ENTEROSTOMY; W/RESECT COLORECTAL ANASTOM;  Surgeon: Claretta Fraise, MD; Location: MAIN OR [place];  Service: Gastrointestinal    PR COLONOSCOPY FLX DX W/COLLJ SPEC WHEN PFRMD Left 01/07/2013    Procedure: COLONOSCOPY, FLEXIBLE, PROXIMAL TO SPLENIC FLEXURE; DIAGNOSTIC, W/WO COLLECTION SPECIMEN BY BRUSH OR WASH;  Surgeon: Tish Men, MD;  [facility];  Service: Gastroenterology    PR COLONOSCOPY FLX DX W/COLLJ SPEC WHEN PFRMD  03/09/2015    Procedure: COLONOSCOPY, FLEXIBLE, PROXIMAL TO SPLENIC FLEXURE; DIAGNOSTIC, W/WO COLLECTION SPECIMEN BY BRUSH OR WASH;  Surgeon: Cletis Athens, MD;  Location: [facility];  Service: Gastroenterology    PR COLONOSCOPY FLX DX W/COLLJ SPEC WHEN PFRMD N/A 10/28/2019    Procedure: COLONOSCOPY, FLEXIBLE, PROXIMAL TO SPLENIC FLEXURE; DIAGNOSTIC, W/WO COLLECTION SPECIMEN BY BRUSH OR WASH;  Surgeon: Beverly Milch, MD;  Location: [facility]URES [facility];  Service: Gastroenterology    PR COLONOSCOPY FLX DX W/COLLJ SPEC WHEN PFRMD N/A 10/01/2022    Procedure: COLONOSCOPY, FLEXIBLE, PROXIMAL TO SPLENIC FLEXURE; DIAGNOSTIC, W/WO COLLECTION SPECIMEN BY BRUSH OR WASH;  Surgeon: Zetta Bills, MD;  [facility]: [facility];  Service: Gastroenterology    PR EXPLORATORY OF ABDOMEN Midline 09/02/2019    Procedure: EXPLORATORY LAPAROTOMY, EXPLORATORY CELIOTOMY WITH OR WITHOUT BIOPSY(S);  Surgeon: Claretta Fraise, MD;  Location: MAIN OR [facility];  Service: Gastrointestinal    PR EXPLORATORY OF ABDOMEN Midline 11/01/2019    Procedure: EXPLORATORY LAPAROTOMY, EXPLORATORY CELIOTOMY WITH OR WITHOUT BIOPSY(S);  Surgeon: Claretta Fraise, MD;  Location: MAIN OR [facility];  Service: Gastrointestinal    PR FIX FINGER,VOLAR PLATE,I-P JT Left 08/07/2022    Procedure: REPAIR AND RECONSTRUCTION, FINGER, VOLAR PLATE, INTERPHALANGEAL JOINT;  Surgeon: [name], MD;  Location: MAIN OR [facility];  Service: Orthopedics    PR FREEING BOWEL ADHESION,ENTEROLYSIS N/A 11/01/2019    Procedure: Enterolysis (Separt Proc);  Surgeon: Claretta Fraise, MD;  Location: MAIN OR [facility];  Service: Gastrointestinal    PR ILEOSCOPY THRU STOMA,BIOPSY N/A 10/14/2019  Procedure: ILEOSCOPY-STOMA; W/BX 1/MX;  Surgeon: Neysa Hotter, MD;  [facility];  Service: Gastroenterology    PR PART REMOVAL COLON W COLOSTOMY Midline 09/02/2019    Procedure: COLECTOMY, PARTIAL; WITH SKIN LEVEL CECOSTOMY OR COLOSTOMY;  Surgeon: Claretta Fraise, MD;  Location: MAIN OR [facility];  Service: Gastrointestinal    PR REPAIR INCISIONAL HERNIA,REDUCIBLE Midline 09/02/2019    Procedure: REPAIR INIT INCISIONAL OR VENTRAL HERNIA; REDUCIBLE;  Surgeon: Kristopher Oppenheim, MD;  Location: MAIN OR [place];  Service: Trauma    PR REPAIR INTERCARP/CARP-METACARP JT Left 08/07/2022    Procedure: Jorene Guest;  Surgeon: [name], MD;  Location: MAIN OR [facility];  Service: Orthopedics    PR UPPER GI ENDOSCOPY,BIOPSY N/A 01/07/2013    Procedure: UGI ENDOSCOPY; WITH BIOPSY, SINGLE OR MULTIPLE;  Surgeon: Tish Men, MD;  Location: [facility];  Service: Gastroenterology    PR UPPER GI ENDOSCOPY,BIOPSY N/A 10/14/2019    Procedure: UGI ENDOSCOPY; WITH BIOPSY, SINGLE OR MULTIPLE;  Surgeon: Neysa Hotter, MD;  [facility];  Service: Gastroenterology    PR UPPER GI ENDOSCOPY,DIAGNOSIS N/A 10/01/2022    Procedure: UGI ENDO, INCLUDE ESOPHAGUS, STOMACH, & DUODENUM &/OR JEJUNUM; DX W/WO COLLECTION SPECIMN, BY BRUSH OR WASH;  Surgeon: Zetta Bills, MD;  Location: [facility]URES [facility];  Service: Gastroenterology

## 2022-11-27 NOTE — Unmapped (Signed)
I saw and evaluated the patient, participating in the key portions of the service.  I reviewed the resident???s note.  I agree with the resident???s findings and plan. Richardean Canal, MD

## 2022-11-27 NOTE — Unmapped (Signed)
Pt BIB [facility], coming from [facility] shelter. Here frequently for abd pain, similar tonight. Reports vomiting blood tonight. Diarrhea.

## 2022-11-28 NOTE — Unmapped (Signed)
Pt left AMA ambulatory steady on his feet with all his belongings. He left at 1530

## 2022-11-29 NOTE — Unmapped (Signed)
Bed: HALL-01A  Expected date:   Expected time:   Means of arrival:   Comments:

## 2022-12-02 DIAGNOSIS — K50918 Crohn's disease, unspecified, with other complication: Principal | ICD-10-CM

## 2022-12-02 MED FILL — HUMIRA PEN CITRATE FREE 40 MG/0.4 ML: SUBCUTANEOUS | 28 days supply | Qty: 4 | Fill #5

## 2022-12-04 NOTE — Unmapped (Signed)
LWBS/AMA Dispostion of AMA. Patient signed out AMA after informed discussion with provider. No follow up required.

## 2022-12-24 DIAGNOSIS — K50918 Crohn's disease, unspecified, with other complication: Principal | ICD-10-CM

## 2022-12-24 MED ORDER — HUMIRA PEN CITRATE FREE 40 MG/0.4 ML
SUBCUTANEOUS | 5 refills | 28 days
Start: 2022-12-24 — End: ?

## 2022-12-25 MED ORDER — HUMIRA PEN CITRATE FREE 40 MG/0.4 ML
SUBCUTANEOUS | 5 refills | 28 days | Status: CP
Start: 2022-12-25 — End: ?
  Filled 2023-01-01: qty 4, 28d supply, fill #0

## 2022-12-30 ENCOUNTER — Ambulatory Visit: Admit: 2022-12-30 | Discharge: 2022-12-30 | Disposition: A | Discharge status: Home / Self Care | Payer: MEDICAID

## 2022-12-30 LAB — CBC W/ AUTO DIFF
BASOPHILS ABSOLUTE COUNT: 0.1 10*9/L (ref 0.0–0.1)
BASOPHILS RELATIVE PERCENT: 0.9 %
EOSINOPHILS ABSOLUTE COUNT: 0.1 10*9/L (ref 0.0–0.5)
EOSINOPHILS RELATIVE PERCENT: 1.6 %
HEMATOCRIT: 41.1 % (ref 39.0–48.0)
HEMOGLOBIN: 13.6 g/dL (ref 12.9–16.5)
LYMPHOCYTES ABSOLUTE COUNT: 2.3 10*9/L (ref 1.1–3.6)
LYMPHOCYTES RELATIVE PERCENT: 24.4 %
MEAN CORPUSCULAR HEMOGLOBIN CONC: 33.2 g/dL (ref 32.0–36.0)
MEAN CORPUSCULAR HEMOGLOBIN: 27.9 pg (ref 25.9–32.4)
MEAN CORPUSCULAR VOLUME: 83.9 fL (ref 77.6–95.7)
MEAN PLATELET VOLUME: 8 fL (ref 6.8–10.7)
MONOCYTES ABSOLUTE COUNT: 0.7 10*9/L (ref 0.3–0.8)
MONOCYTES RELATIVE PERCENT: 7.9 %
NEUTROPHILS ABSOLUTE COUNT: 6 10*9/L (ref 1.8–7.8)
NEUTROPHILS RELATIVE PERCENT: 65.2 %
PLATELET COUNT: 222 10*9/L (ref 150–450)
RED BLOOD CELL COUNT: 4.89 10*12/L (ref 4.26–5.60)
RED CELL DISTRIBUTION WIDTH: 15.9 % — ABNORMAL HIGH (ref 12.2–15.2)
WBC ADJUSTED: 9.3 10*9/L (ref 3.6–11.2)

## 2022-12-30 LAB — COMPREHENSIVE METABOLIC PANEL
ALBUMIN: 2.8 g/dL — ABNORMAL LOW (ref 3.4–5.0)
ALKALINE PHOSPHATASE: 141 U/L — ABNORMAL HIGH (ref 46–116)
ALT (SGPT): 17 U/L (ref 10–49)
ANION GAP: 5 mmol/L (ref 5–14)
AST (SGOT): 20 U/L (ref <=34)
BILIRUBIN TOTAL: 0.3 mg/dL (ref 0.3–1.2)
BLOOD UREA NITROGEN: 12 mg/dL (ref 9–23)
BUN / CREAT RATIO: 11
CALCIUM: 8.8 mg/dL (ref 8.7–10.4)
CHLORIDE: 111 mmol/L — ABNORMAL HIGH (ref 98–107)
CO2: 24 mmol/L (ref 20.0–31.0)
CREATININE: 1.11 mg/dL
EGFR CKD-EPI (2021) MALE: 82 mL/min/{1.73_m2} (ref >=60)
GLUCOSE RANDOM: 91 mg/dL (ref 70–179)
POTASSIUM: 4.1 mmol/L (ref 3.4–4.8)
PROTEIN TOTAL: 5.6 g/dL — ABNORMAL LOW (ref 5.7–8.2)
SODIUM: 140 mmol/L (ref 135–145)

## 2022-12-30 LAB — LIPASE: LIPASE: 33 U/L (ref 12–53)

## 2022-12-30 LAB — C-REACTIVE PROTEIN: C-REACTIVE PROTEIN: <4 mg/L (ref <=10.0)

## 2022-12-30 LAB — SEDIMENTATION RATE: ERYTHROCYTE SEDIMENTATION RATE: 11 mm/h (ref 0–15)

## 2022-12-30 MED ADMIN — HYDROmorphone (DILAUDID) tablet 1 mg: 1 mg | ORAL | @ 07:00:00 | Stop: 2022-12-30

## 2022-12-30 MED ADMIN — ondansetron (ZOFRAN) injection 4 mg: 4 mg | INTRAVENOUS | @ 06:00:00 | Stop: 2022-12-30

## 2022-12-30 NOTE — Unmapped (Signed)
Pt here with chronic abdominal pain, hx of Chrons/pancreatitis.

## 2022-12-30 NOTE — Unmapped (Signed)
[facility]  Emergency Department Provider Note      ED Clinical Impression      Final diagnoses:   Abdominal pain, unspecified abdominal location (Primary)            Impression, Medical Decision Making, Progress Notes and Critical Care   Impression, Differential Diagnosis and Plan of Care  Jerome Vantuyl. is a 47 y.o. 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  Workup with no leukocytosis, no inflammatory marker elevation, normal lipase.  No fever and stable vital signs.  Differential for abdominal pain includes gastroenteritis, stricture. Low suspicion for flare of Crohn's disease, given lack of ESR/CRP elevation.  Low suspicion for SBO given having bowel movements.  Hernia reducible.  Low suspicion for appendicitis or abscess given lack of fever and leukocytosis.  Normal lipase reassuring against pancreatitis.  Patient reporting improvement in his symptoms after receiving pain medications, able to tolerate oral intake.  He is requesting discharge home without CT abdomen/pelvis, which had been ordered, which I believe is appropriate as his physical exam, vital signs, and lab workup are reassuring.  Provided return precautions.    Independent Interpretation of Studies  I have independently interpreted the following studies: Labs, see ED course below    Discussion of Management with other Providers or Support Staff  I discussed the management of this patient with no consultants    Considerations Regarding Disposition/Escalation of Care and Critical Care  Indications for observation/admission (or consideration of observation/admission) and/or appropriateness for outpatient management: Appropriate for discharge home, see explanation above  Patient/Family/Caregiver Discussions: Patient expressed understanding of plan and in agreement  Diagnostic Tests Considered But Not Done: CT abdomen and pelvis, declined by patient  Prescription Drugs Provided or Considered But Not Given: None  Social Determinants of Health which significantly affected care: Undomiciled    ED Course  ED Course as of 12/30/22 0335   Tue Dec 30, 2022   0115 Examined patient.  Ordered ESR, CRP, CMP, CMP, lipase, UA, CTAP.  Ordered Zofran for nausea and dilaudid for pain.   0205 WBC: 9.3  No leukocytosis   0209 Sed Rate: 11  No ESR elevation   0246 C-reactive protein:    CRP <4.0  No CRP elevation   0246 Lipase Level:    Lipase 33  Normal lipase   0300 Patient requesting food.  Says that if he can, he would prefer to leave at this time.  Is declining CTAP.  Given unremarkable labs, improvement in symptoms, and patient's preferences, will defer CTAP at this time.  Okay to eat   0335 Patient tolerated eating, appropriate for discharge at this time per patient request       Portions of this record have been created using Dragon dictation software. Dictation errors have been sought, but may not have been identified and corrected.    See chart and nursing documentation for additional details.         History   Reason for Visit  Abdominal Pain and Emesis    HPI   Jerome Snowdon. is a 47 y.o. male with a history of Crohn's disease s/p ileoececectomy and right hemicolectomy c/b hernias and short gut syndrome, prior SBO, iron deficiency anemia, h/o C diff colitis (2023),  and abdominal varices/collaterals with prior concern for SVC obstruction, COPD, bipolar disorder, tobacco use  presenting with acute on chronic abdominal pain.    Was seen in the emergency department on 11/27/2022 for his symptoms.  At that time had a CTAP with no acute findings (did show stable extensive variceal/collateralization of the abdomen, concern could represent SVC obstruction), stable H&H, elevated CRP.  GI was consulted.  Patient refused endoscopy and left AMA prior to complete evaluation.    Today, patient has abdominal pain that started at 9:30 PM.  Also has had 2 episodes of emesis and 2 episodes of diarrhea.  Pain is located in his left lower quadrant.  Says it is similar to prior episodes of pain.  He took 2 ibuprofen at home with minimal relief.  No fevers.  No blood in stool or emesis.  Did not eat anything unusual today, had some baked chicken with cheese for dinner.    Outside Historian(s)  -None    External Records Reviewed  - Inpatient/Outpatient notes  - Prior labs/imaging studies    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 difficile colitis    Arthritis of carpometacarpal (CMC) joint of left thumb    Periumbilical abdominal pain    Hypomagnesemia    Iron deficiency anemia    Bipolar 1 disorder (CMS-HCC)    COPD (chronic obstructive pulmonary disease) (CMS-HCC)    Homelessness    Hx of deep venous thrombosis    Tobacco use disorder       Past Surgical History:   Procedure Laterality Date    CHOLECYSTECTOMY      COLON SURGERY      partial resection    HERNIA REPAIR      PR CLOSE ENTEROSTOMY,RESEC+COLOREC ANAS Midline 11/01/2019    Procedure: CLO ENTEROSTOMY; W/RESECT COLORECTAL ANASTOM;  Surgeon: Claretta Fraise, MD;  Location: MAIN OR [place];  Service: Gastrointestinal    PR COLONOSCOPY FLX DX W/COLLJ SPEC WHEN PFRMD Left 01/07/2013    Procedure: COLONOSCOPY, FLEXIBLE, PROXIMAL TO SPLENIC FLEXURE; DIAGNOSTIC, W/WO COLLECTION SPECIMEN BY BRUSH OR WASH;  Surgeon: Tish Men, MD;  Location: [facility];  Service: Gastroenterology    PR COLONOSCOPY FLX DX W/COLLJ SPEC WHEN PFRMD  03/09/2015    Procedure: COLONOSCOPY, FLEXIBLE, PROXIMAL TO SPLENIC FLEXURE; DIAGNOSTIC, W/WO COLLECTION SPECIMEN BY BRUSH OR WASH;  Surgeon: Cletis Athens, MD;  Location: GI [facility];  Service: Gastroenterology    PR COLONOSCOPY FLX DX W/COLLJ SPEC WHEN PFRMD N/A 10/28/2019  Procedure: COLONOSCOPY, FLEXIBLE, PROXIMAL TO SPLENIC FLEXURE; DIAGNOSTIC, W/WO COLLECTION SPECIMEN BY BRUSH OR WASH;  Surgeon: Beverly Milch, MD;  Location: GI [facility]URES [facility];  Service: Gastroenterology    PR COLONOSCOPY FLX DX W/COLLJ SPEC WHEN PFRMD N/A 10/01/2022    Procedure: COLONOSCOPY, FLEXIBLE, PROXIMAL TO SPLENIC FLEXURE; DIAGNOSTIC, W/WO COLLECTION SPECIMEN BY BRUSH OR WASH;  Surgeon: Zetta Bills, MD;  Location: GI PROCEDURES [facility];  Service: Gastroenterology    PR EXPLORATORY OF ABDOMEN Midline 09/02/2019    Procedure: EXPLORATORY LAPAROTOMY, EXPLORATORY CELIOTOMY WITH OR WITHOUT BIOPSY(S);  Surgeon: Claretta Fraise, MD;  Location: MAIN OR [facility];  Service: Gastrointestinal    PR EXPLORATORY OF ABDOMEN Midline 11/01/2019    Procedure: EXPLORATORY LAPAROTOMY, EXPLORATORY CELIOTOMY WITH OR WITHOUT BIOPSY(S);  Surgeon: Claretta Fraise, MD;  Location: MAIN OR [facility];  Service: Gastrointestinal    PR FIX FINGER,VOLAR PLATE,I-P JT Left 08/07/2022    Procedure: REPAIR AND RECONSTRUCTION, FINGER, VOLAR PLATE, INTERPHALANGEAL JOINT;  Surgeon: [name], MD;  Location: MAIN OR [facility];  Service: Orthopedics    PR FREEING BOWEL ADHESION,ENTEROLYSIS N/A 11/01/2019    Procedure: Enterolysis (Separt Proc);  Surgeon: Claretta Fraise, MD;  Location: MAIN OR [facility];  Service: Gastrointestinal    PR ILEOSCOPY THRU STOMA,BIOPSY N/A 10/14/2019    Procedure: Karren Cobble; [place] Saa 1/MX;  Surgeon: Neysa Hotter, MD;  Location: GI [facility]URES [facility];  Service: Gastroenterology    PR PART REMOVAL COLON W COLOSTOMY Midline 09/02/2019    Procedure: COLECTOMY, PARTIAL; WITH SKIN LEVEL CECOSTOMY OR COLOSTOMY;  Surgeon: Claretta Fraise, MD;  Location: MAIN OR [facility];  Service: Gastrointestinal    PR REPAIR INCISIONAL HERNIA,REDUCIBLE Midline 09/02/2019    Procedure: REPAIR INIT INCISIONAL OR VENTRAL HERNIA; REDUCIBLE;  Surgeon: Kristopher Oppenheim, MD;  Location: MAIN OR [place];  Service: Trauma    PR REPAIR INTERCARP/CARP-METACARP JT Left 08/07/2022    Procedure: Jorene Guest;  Surgeon: [name], MD;  Location: MAIN OR [facility];  Service: Orthopedics    PR UPPER GI ENDOSCOPY,BIOPSY N/A 01/07/2013    Procedure: UGI ENDOSCOPY; WITH BIOPSY, SINGLE OR MULTIPLE;  Surgeon: Tish Men, MD;  Location: GI [facility]URES [facility];  Service: Gastroenterology    PR UPPER GI ENDOSCOPY,BIOPSY N/A 10/14/2019    Procedure: UGI ENDOSCOPY; WITH BIOPSY, SINGLE OR MULTIPLE;  Surgeon: Neysa Hotter, MD;  Location: GI [facility]URES [facility];  Service: Gastroenterology    PR UPPER GI ENDOSCOPY,DIAGNOSIS N/A 10/01/2022    Procedure: UGI ENDO, INCLUDE ESOPHAGUS, STOMACH, & DUODENUM &/OR JEJUNUM; DX W/WO COLLECTION SPECIMN, BY BRUSH OR WASH;  Surgeon: Zetta Bills, MD;  Location: GI PROCEDURES [facility];  Service: Gastroenterology       No current facility-administered medications for this encounter.    Current Outpatient Medications:     albuterol HFA 90 mcg/actuation inhaler, Inhale 2 puffs every six (6) hours as needed for wheezing., Disp: 18 g, Rfl: 0    ascorbic acid, vitamin C, (VITAMIN C) 250 MG tablet, Take 1 tablet (250 mg total) by mouth every other day. Take with oral iron, Disp: 30 tablet, Rfl: 11    budesonide-formoterol (SYMBICORT) 80-4.5 mcg/actuation inhaler, Inhale 2 puffs  in the morning and 2 puffs in the evening., Disp: 6.9 g, Rfl: 2    calcium carbonate (TUMS) 200 mg calcium (500 mg) chewable tablet, Chew 2 tablets (400 mg of elem calcium total) two (2) times a day., Disp: 60 tablet, Rfl: 0    cholecalciferol, vitamin D3 25 mcg, 1,000 units,, 1,000  unit (25 mcg) tablet, Take 1 tablet (25 mcg total) by mouth daily., Disp: 30 tablet, Rfl: 0    cyanocobalamin, vitamin B-12, 1,000 mcg Subl, Place 2,000 mcg under the tongue in the morning., Disp: 180 tablet, Rfl: 3    empty container Misc, Use as directed to dispose of Humira, Disp: 1 each, Rfl: 3    ferrous sulfate 325 (65 FE) MG tablet, Take 1 tablet (325 mg total) by mouth every other day., Disp: , Rfl:     fluticasone propionate (FLONASE) 50 mcg/actuation nasal spray, 2 sprays into each nostril daily as needed for allergies., Disp: 16 g, Rfl: 0    HUMIRA PEN CITRATE FREE 40 MG/0.4 ML, Inject the contents of 1 pen (40 mg total) under the skin every seven (7) days., Disp: 4 each, Rfl: 5    magnesium oxide (MAG-OX) 400 mg (241.3 mg elemental magnesium) tablet, Take 1 tablet (400 mg total) by mouth two (2) times a day., Disp: , Rfl:     nicotine (NICODERM CQ) 21 mg/24 hr patch, Place 1 patch on the skin daily., Disp: 28 patch, Rfl: 0    OPTICHAMBER DIAMOND [facility] Spcr, , Disp: , Rfl:     oxyCODONE (ROXICODONE) 5 MG immediate release tablet, Take 1 tablet (5 mg total) by mouth daily as needed for pain., Disp: , Rfl:     pantoprazole (PROTONIX) 40 MG tablet, Take 1 tablet (40 mg total) by mouth daily., Disp: 90 tablet, Rfl: 0    QUEtiapine (SEROQUEL) 100 MG tablet, Take 1 tablet (100 mg total) by mouth nightly., Disp: , Rfl:     Allergies  Morphine and Toradol [ketorolac]    Family History   Problem Relation Age of Onset    Breast cancer Mother     Diabetes Maternal Aunt     Colon cancer Maternal Grandmother     Prostate cancer Maternal Grandfather     Cancer Paternal Grandmother     Crohn's disease Neg Hx        Social History  Social History     Tobacco Use    Smoking status: Every Day     Current packs/day: 0.30     Average packs/day: 0.5 packs/day for 21.2 years (10.2 ttl pk-yrs)     Types: Cigarettes     Start date: 10/02/2001     Passive exposure: Past    Smokeless tobacco: Never    Tobacco comments:     Reduced use from 10cpd to 5cpd, though relights each one. Motivated to quit.    Vaping Use    Vaping status: Some Days    Substances: Nicotine    Passive vaping exposure: Yes   Substance Use Topics    Alcohol use: No     Alcohol/week: 0.0 standard drinks of alcohol    Drug use: Yes     Types: Marijuana     Comment: Smokes 2-3 joints/day          Physical Exam     This provider entered the patient's room: YES  If this provider did enter the patient room, the following was PPE worn: surgical mask and gloves    ED Triage Vitals [12/29/22 2352]   Enc Vitals Group      BP       Heart Rate 70      SpO2 Pulse       Resp 16      Temp       Temp src  SpO2 98 %       Constitutional: Alert and oriented. Well appearing, but appears uncomfortable, hunched over stomach  Eyes: Conjunctivae are normal  ENT       Head: Normocephalic and atraumatic.       Nose: No congestion.       Mouth/Throat: Mucous membranes are moist.  Cardiovascular: Normal rate, regular rhythm  Respiratory: Normal respiratory effort. Breath sounds are normal.  Gastrointestinal: Soft, nondistended, tender in the lower quadrants.  Abdominal hernia that is soft and reducible.    Musculoskeletal: Normal range of motion in all extremities. No tenderness or edema in bilateral lower extremities  Neurologic: Normal speech and language. No gross focal neurologic deficits are appreciated.  Skin: Skin is warm, dry and intact. No rash noted.  Psychiatric: Mood and affect are normal. Speech and behavior are normal.     Radiology   CT abdomen pelvis ordered, but patient declined.  No imaging obtained     Procedures   None        Noralyn Pick, MD  Resident  12/30/22 [phone]

## 2022-12-30 NOTE — Unmapped (Addendum)
Pt reports PMH crohn's disease w/ chronic diarrhea. Pt reports sudden onset of vomiting and abdominal pain at 2100. Pt took ibuprofen for abdominal pain at 2130 w/o relief. Pt is homeless and lives in a shelter. nPt reports lasat eating chicken at 1700 last night. Pt denies ETOH use tonight. Pt reports that he has no home medications and does not take meds daily.

## 2022-12-31 NOTE — Unmapped (Signed)
[facility] Refill Coordination Note    Specialty Medication(s) to be Shipped:   Inflammatory Disorders: Humira    Other medication(s) to be shipped: No additional medications requested for fill at this time     Jerome Kennedy., DOB: 12-01-1975  Phone: [phone] (home)       All above HIPAA information was verified with patient.     Was a Nurse, learning disability used for this call? No    Completed refill call assessment today to schedule patient's medication shipment from the [facility] [phone]).  All relevant notes have been reviewed.     Specialty medication(s) and dose(s) confirmed: Regimen is correct and unchanged.   Changes to medications: Devohn reports no changes at this time.  Changes to insurance: No  New side effects reported not previously addressed with a pharmacist or physician: None reported  Questions for the pharmacist: No    Confirmed patient received a Conservation officer, historic buildings and a Surveyor, mining with first shipment. The patient will receive a drug information handout for each medication shipped and additional FDA Medication Guides as required.       DISEASE/MEDICATION-SPECIFIC INFORMATION        For patients on injectable medications: Patient currently has 0 doses left.  Next injection is scheduled for 6/2.    SPECIALTY MEDICATION ADHERENCE     Medication Adherence    Patient reported X missed doses in the last month: 0  Specialty Medication: HUMIRA(CF) PEN 40 mg/0.4 mL  Patient is on additional specialty medications: No              Were doses missed due to medication being on hold? No    Humira 40/0.4 mg/ml: 0 days of medicine on hand           REFERRAL TO PHARMACIST     Referral to the pharmacist: Not needed      [facility]     Shipping address confirmed in Epic.       Delivery Scheduled: Yes, Expected medication delivery date: 01/01/23.     Medication will be delivered via Same Day Courier to the prescription address in Epic WAM.    [name]   [facility] Specialty Technician

## 2023-01-04 DIAGNOSIS — K50018 Crohn's disease of small intestine with other complication: Principal | ICD-10-CM

## 2023-01-22 NOTE — Unmapped (Signed)
Patient called and states that he needs his Humira prescription refilled and sent to [facility] pharmacy in [place].       Routing to Alesia Banda, Charity fundraiser for assistance.

## 2023-01-25 ENCOUNTER — Ambulatory Visit
Admit: 2023-01-25 | Discharge: 2023-01-26 | Disposition: A | Discharge status: Home / Self Care | Payer: MEDICAID | Attending: Family

## 2023-01-25 DIAGNOSIS — R109 Unspecified abdominal pain: Principal | ICD-10-CM

## 2023-01-25 DIAGNOSIS — E876 Hypokalemia: Principal | ICD-10-CM

## 2023-01-25 DIAGNOSIS — R112 Nausea with vomiting, unspecified: Principal | ICD-10-CM

## 2023-01-25 DIAGNOSIS — Z8719 Personal history of other diseases of the digestive system: Principal | ICD-10-CM

## 2023-01-25 LAB — CBC W/ AUTO DIFF
BASOPHILS ABSOLUTE COUNT: 0.1 10*9/L (ref 0.0–0.1)
BASOPHILS RELATIVE PERCENT: 1 %
EOSINOPHILS ABSOLUTE COUNT: 0.1 10*9/L (ref 0.0–0.5)
EOSINOPHILS RELATIVE PERCENT: 1.7 %
HEMATOCRIT: 36.9 % — ABNORMAL LOW (ref 39.0–48.0)
HEMOGLOBIN: 12.5 g/dL — ABNORMAL LOW (ref 12.9–16.5)
LYMPHOCYTES ABSOLUTE COUNT: 1.9 10*9/L (ref 1.1–3.6)
LYMPHOCYTES RELATIVE PERCENT: 32.1 %
MEAN CORPUSCULAR HEMOGLOBIN CONC: 33.8 g/dL (ref 32.0–36.0)
MEAN CORPUSCULAR HEMOGLOBIN: 28.3 pg (ref 25.9–32.4)
MEAN CORPUSCULAR VOLUME: 83.9 fL (ref 77.6–95.7)
MEAN PLATELET VOLUME: 8 fL (ref 6.8–10.7)
MONOCYTES ABSOLUTE COUNT: 0.3 10*9/L (ref 0.3–0.8)
MONOCYTES RELATIVE PERCENT: 5.7 %
NEUTROPHILS ABSOLUTE COUNT: 3.6 10*9/L (ref 1.8–7.8)
NEUTROPHILS RELATIVE PERCENT: 59.5 %
PLATELET COUNT: 236 10*9/L (ref 150–450)
RED BLOOD CELL COUNT: 4.4 10*12/L (ref 4.26–5.60)
RED CELL DISTRIBUTION WIDTH: 14.9 % (ref 12.2–15.2)
WBC ADJUSTED: 6 10*9/L (ref 3.6–11.2)

## 2023-01-25 LAB — COMPREHENSIVE METABOLIC PANEL
ALBUMIN: 2.6 g/dL — ABNORMAL LOW (ref 3.4–5.0)
ALKALINE PHOSPHATASE: 121 U/L — ABNORMAL HIGH (ref 46–116)
ALT (SGPT): 11 U/L (ref 10–49)
ANION GAP: 6 mmol/L (ref 5–14)
AST (SGOT): 13 U/L (ref <=34)
BILIRUBIN TOTAL: 0.3 mg/dL (ref 0.3–1.2)
BLOOD UREA NITROGEN: 10 mg/dL (ref 9–23)
BUN / CREAT RATIO: 9
CALCIUM: 7.9 mg/dL — ABNORMAL LOW (ref 8.7–10.4)
CHLORIDE: 115 mmol/L — ABNORMAL HIGH (ref 98–107)
CO2: 23 mmol/L (ref 20.0–31.0)
CREATININE: 1.11 mg/dL
EGFR CKD-EPI (2021) MALE: 82 mL/min/{1.73_m2} (ref >=60)
GLUCOSE RANDOM: 98 mg/dL (ref 70–179)
POTASSIUM: 3.3 mmol/L — ABNORMAL LOW (ref 3.4–4.8)
PROTEIN TOTAL: 5.2 g/dL — ABNORMAL LOW (ref 5.7–8.2)
SODIUM: 144 mmol/L (ref 135–145)

## 2023-01-25 LAB — SEDIMENTATION RATE: ERYTHROCYTE SEDIMENTATION RATE: 11 mm/h (ref 0–15)

## 2023-01-25 LAB — LIPASE: LIPASE: 32 U/L (ref 12–53)

## 2023-01-25 MED ORDER — PROMETHAZINE 25 MG TABLET
ORAL_TABLET | Freq: Four times a day (QID) | ORAL | 0 refills | 4 days | Status: CP | PRN
Start: 2023-01-25 — End: 2023-02-01

## 2023-01-26 MED ADMIN — HYDROmorphone (PF) (DILAUDID) injection 0.5 mg: 0.5 mg | INTRAVENOUS | Stop: 2023-01-25

## 2023-01-26 MED ADMIN — HYDROmorphone (PF) (DILAUDID) injection 0.5 mg: 0.5 mg | INTRAVENOUS | @ 01:00:00 | Stop: 2023-01-25

## 2023-01-26 MED ADMIN — promethazine (PHENERGAN) 12.5 mg in sodium chloride (NS) 0.9 % 25 mL infusion: 12.5 mg | INTRAVENOUS | Stop: 2023-01-25

## 2023-01-26 MED ADMIN — sodium chloride 0.9% (NS) bolus 1,000 mL: 1000 mL | INTRAVENOUS | Stop: 2023-01-25

## 2023-01-26 MED ADMIN — potassium chloride ER tablet 20 mEq: 20 meq | ORAL | @ 01:00:00 | Stop: 2023-01-25

## 2023-01-26 NOTE — Unmapped (Signed)
[facility]  Emergency Department Progress Note    January 25, 2023 9:22 PM    I supervised care provided by the resident/APP. We have discussed the case, I have reviewed the note and I agree with the plan of treatment.      ED care from [place] [employer] [place], [place] at 2100.     Medical Decision Making and Progress Notes     ED Course as of 01/25/23 2123   Wynelle Link Jan 25, 2023   482 47 year old male with abdominal pain signed out pending p.o. challenge.  Labs are all baseline for the patient.  He received a small dose of potassium.  He received pain medication on the emergency department.  He tolerated a p.o. challenge without difficulty.  He requested a refill of pain medication as well as antinausea medication.  I discussed that he needs to follow-up with his outpatient care team for any long-term pain medications but that I will provide a short course of antinausea medicine as needed.  Discharged with outpatient follow-up and return precautions.         Additional Progress Notes and Critical Care    NA    Portions of this record have been created using Dragon dictation software. Dictation errors have been sought, but may not have been identified and corrected.      ED Clinical Impression     Final diagnoses:   Abdominal pain, unspecified abdominal location (Primary)   Hx of Crohn's disease   Nausea and vomiting, unspecified vomiting type   Hypokalemia

## 2023-01-26 NOTE — Unmapped (Signed)
Pt asking for 2 sandwiches, given as per request. Pt able to keep food down  Not complaining of abd pain at this time

## 2023-01-26 NOTE — Unmapped (Signed)
Here with lower abdominal pain and vomiting x 2 episodes. Hx Crohn's

## 2023-01-26 NOTE — Unmapped (Signed)
Taxi Voucher provided.

## 2023-01-27 NOTE — Unmapped (Signed)
[facility]  Established Patient Clinic Note    Assessment/Plan:   Jerome Kennedy is a 47 y.o.male    Problem List Items Addressed This Visit       Crohn's disease (CMS-HCC)     Followed by [facility] GI. Complex surgical history. Fairly stable on Humira injection weekly, no issues. Weight improving. Has had >3 ED presentations for abdominal pain since last visit. Denies abdominal pain, bloody stools.  - Discussed need for follow-up with GI and that per last GI note should attempt to see GI or GI Urgent Care when having abdominal pain. Patient amenable to plan         Bipolar 1 disorder (CMS-HCC) (Chronic)     Moving out of [facility], so will no longer be followed by their Psych team. Would like referral to establish with Psychiatry. Doing well with Seroquel 100 mg nightly.   - Referral to [facility] sent today  - Will continue Seroquel 100 mg nightly            Relevant Orders    Ambulatory referral to Psychiatry    COPD (chronic obstructive pulmonary disease) (CMS-HCC) (Chronic)     Patient reports prior diagnosis, but never completed lung function testing. Per chart review, first called COPD vs Asthma in 2014 during hospitalization where patient had cough, wheeze, productive sputum, and CXR with hyperinflation. Was supposed to get outpatient PFTs that were not completed. Has never seen Pulmonology. Denies SOB or wheeze. Has not been using Symbicort, but uses albuterol as needed.  - Ordered PFTs to confirm diagnosis  - Continue albuterol PRN, can consider restarting Symbicort pending PFTs         Relevant Orders    Spirometry Pre/Post Bronchodilator    Dental caries - Primary     Patient with dental caries and multiple missing teeth. Would like referral for Dental care.  - Discussed that does not need a referral to see dentist, but will refer today for ease         Relevant Orders    Ambulatory referral to Dentistry    Hypokalemia     K 3.3 at recent ED visit on 6/23 iso nausea and vomiting. Received potassium supplementation. Likely due to vomiting, but will recheck today.  - order K level         Relevant Orders    Potassium Level       RTC 6 months for annual physical, sooner if needed.     Attending: Dr. Ane Payment    Subjective   Jerome Kennedy is a 47 y.o. male  coming to clinic today for the following issues:    Chief Complaint   Patient presents with    Referral Setup     For dentist      HPI:    # Referral for dentist  Has dental caries and needs fillings replaced    # Help with Emergency Call Button  Moving into his own place and will be living alone  Would like help with getting emergency call button as he can't see the small print on his phone to set it up himself    # Crohn's  Needs to set up GI appointment  Has been doing well with Humira weekly    I have reviewed the problem list, medications, and allergies and have updated/reconciled them if needed.    Jerome Kennedy  reports that he has been smoking cigarettes. He started smoking about 21 years ago. He has  a 10.2 pack-year smoking history. He has been exposed to tobacco smoke. He has never used smokeless tobacco.  Health Maintenance   Topic Date Due    COPD Spirometry  Never done    Lipid Screening  05/21/2027    DTaP/Tdap/Td Vaccines (2 - Td or Tdap) 11/24/2030    Colon Cancer Screening  10/01/2032    Pneumococcal Vaccine 0-64  Completed    Hepatitis C Screen  Completed    COVID-19 Vaccine  Completed    Influenza Vaccine  Completed       Objective     VITALS: BP 108/69 (BP Site: L Arm, BP Position: Sitting, BP Cuff Size: Medium)  - Pulse 91  - Temp 36.7 ??C (98.1 ??F) (Temporal)  - Ht 172.7 cm (5' 8) Comment: pt reported - Wt 64.6 kg (142 lb 6.4 oz)  - BMI 21.65 kg/m??     Physical Exam  Vitals reviewed.   Constitutional:       Appearance: Normal appearance. He is normal weight.   HENT:      Head: Normocephalic and atraumatic.      Right Ear: External ear normal.      Left Ear: External ear normal.      Nose: Nose normal.      Mouth/Throat:      Mouth: Mucous membranes are moist. No injury or oral lesions.      Dentition: Abnormal dentition. Dental caries present. No gingival swelling or dental abscesses.      Tongue: No lesions. Tongue does not deviate from midline.      Pharynx: Oropharynx is clear. Uvula midline.   Eyes:      General: No scleral icterus.        Right eye: No discharge.         Left eye: No discharge.      Conjunctiva/sclera: Conjunctivae normal.   Cardiovascular:      Rate and Rhythm: Normal rate.      Pulses: Normal pulses.   Pulmonary:      Effort: Pulmonary effort is normal.   Abdominal:      General: Abdomen is flat.   Musculoskeletal:         General: Normal range of motion.   Skin:     General: Skin is warm and dry.   Neurological:      General: No focal deficit present.      Mental Status: He is alert and oriented to person, place, and time. Mental status is at baseline.   Psychiatric:         Mood and Affect: Mood normal.         Behavior: Behavior normal.         LABS/IMAGING  I have reviewed pertinent recent labs and imaging in Epic      Cherie Dark, MD  Family Medicine Resident PGY-1  [facility] [place] [facility]  [id number], [place], [place] [address]  Telephone [phone]  Fax [phone]  [email or web address]

## 2023-01-28 ENCOUNTER — Ambulatory Visit: Admit: 2023-01-28 | Discharge: 2023-01-29 | Payer: PRIVATE HEALTH INSURANCE

## 2023-01-28 LAB — POTASSIUM: POTASSIUM: 4 mmol/L (ref 3.4–4.8)

## 2023-01-28 NOTE — Unmapped (Signed)
[place] out [place] [facility], so will no longer be followed by their Psych team. Would like referral to establish with Psychiatry. Doing well with Seroquel 100 mg nightly.   - Referral to [facility] sent today  - Will continue Seroquel 100 mg nightly

## 2023-01-28 NOTE — Unmapped (Addendum)
K 3.3 at recent ED visit on 6/23 iso nausea and vomiting. Received potassium supplementation. Likely due to vomiting, but will recheck today.  - order K level  - Discussed will call patient if abnormal results

## 2023-01-28 NOTE — Unmapped (Signed)
Patient with dental caries and multiple missing teeth. Would like referral for Dental care.  - Discussed that does not need a referral to see dentist, but will refer today for ease

## 2023-01-28 NOTE — Unmapped (Signed)
Followed by [facility] GI. Complex surgical history. Fairly stable on Humira injection weekly, no issues. Weight improving. Has had >3 ED presentations for abdominal pain since last visit. Denies abdominal pain, bloody stools.  - Discussed need for follow-up with GI and that per last GI note should attempt to see GI or GI Urgent Care when having abdominal pain. Patient amenable to plan

## 2023-01-28 NOTE — Unmapped (Signed)
Patient reports prior diagnosis, but never completed lung function testing. Per chart review, first called COPD vs Asthma in 2014 during hospitalization where patient had cough, wheeze, productive sputum, and CXR with hyperinflation. Was supposed to get outpatient PFTs that were not completed. Has never seen Pulmonology. Denies SOB or wheeze. Has not been using Symbicort, but uses albuterol as needed.  - Ordered PFTs to confirm diagnosis  - Continue albuterol PRN, can consider restarting Symbicort pending PFTs

## 2023-01-29 NOTE — Unmapped (Signed)
Immediately after or during the visit, I reviewed with the resident the medical history and the resident???s findings on physical examination.  I discussed with the resident the patient???s diagnosis and concur with the treatment plan as documented in the resident note. Kalii Chesmore, MD

## 2023-01-30 DIAGNOSIS — K50019 Crohn's disease of small intestine with unspecified complications: Principal | ICD-10-CM

## 2023-01-30 NOTE — Unmapped (Signed)
[facility] Population Health   Care Management Progress Note             Date of Service:  01/30/2023      Service:  Care Management     Purpose of contact:         SW followed up on provider request to assist patient with resource coordination. SW placed CHW request for emergency button set-up. Patient requires assistance since he has difficulty seeing and does not have access to the internet.       Provider/Care Partner(s) to follow up on: N/A      Sherran Needs, LCSW  Care Manager  [facility]  [phone]

## 2023-02-02 NOTE — Unmapped (Signed)
Received call from patient who is requesting Humira refills be [facility] to [facility] [place]. Patient states that [facility] advised him that he needs an order from the provider's office.    Routing to [employer] for assistance with Humira.

## 2023-02-03 DIAGNOSIS — K50918 Crohn's disease, unspecified, with other complication: Principal | ICD-10-CM

## 2023-02-03 NOTE — Unmapped (Signed)
[facility] has been trying to reach patient to schedule next Humira shipment.  Called patient and was able to reach him, he has a new number which may have been commmunication delay. Explained that Humira must come from a specialty pharmacy and not available at local retail.  Gave him direct number for [place] [facility] and also asked [facility] pharmacy to reach out to patient again asap to coordinate delivery.

## 2023-02-03 NOTE — Unmapped (Signed)
[facility] Clinical Assessment & Refill Coordination Note    Updated address on file    Jerome Petithomme., DOB: [birth year]  Phone: [phone] (home)     All above HIPAA information was verified with patient.     Was a Nurse, learning disability used for this call? No    Specialty Medication(s):   Inflammatory Disorders: Humira     Current Outpatient Medications   Medication Sig Dispense Refill    albuterol HFA 90 mcg/actuation inhaler Inhale 2 puffs every six (6) hours as needed for wheezing. 18 g 0    ascorbic acid, vitamin C, (VITAMIN C) 250 MG tablet Take 1 tablet (250 mg total) by mouth every other day. Take with oral iron 30 tablet 11    budesonide-formoterol (SYMBICORT) 80-4.5 mcg/actuation inhaler Inhale 2 puffs  in the morning and 2 puffs in the evening. 6.9 g 2    calcium carbonate (TUMS) 200 mg calcium (500 mg) chewable tablet Chew 2 tablets (400 mg of elem calcium total) two (2) times a day. 60 tablet 0    cholecalciferol, vitamin D3 25 mcg, 1,000 units,, 1,000 unit (25 mcg) tablet Take 1 tablet (25 mcg total) by mouth daily. 30 tablet 0    cyanocobalamin, vitamin B-12, 1,000 mcg Subl Place 2,000 mcg under the tongue in the morning. 180 tablet 3    empty container Misc Use as directed to dispose of Humira 1 each 3    ferrous sulfate 325 (65 FE) MG tablet Take 1 tablet (325 mg total) by mouth every other day.      fluticasone propionate (FLONASE) 50 mcg/actuation nasal spray 2 sprays into each nostril daily as needed for allergies. 16 g 0    HUMIRA PEN CITRATE FREE 40 MG/0.4 ML Inject the contents of 1 pen (40 mg total) under the skin every seven (7) days. 4 each 5    magnesium oxide (MAG-OX) 400 mg (241.3 mg elemental magnesium) tablet Take 1 tablet (400 mg total) by mouth two (2) times a day.      nicotine (NICODERM CQ) 21 mg/24 hr patch Place 1 patch on the skin daily. 28 patch 0    OPTICHAMBER DIAMOND [facility] Spcr       oxyCODONE (ROXICODONE) 5 MG immediate release tablet Take 1 tablet (5 mg total) by mouth daily as needed for pain.      pantoprazole (PROTONIX) 40 MG tablet Take 1 tablet (40 mg total) by mouth daily. 90 tablet 0    QUEtiapine (SEROQUEL) 100 MG tablet Take 1 tablet (100 mg total) by mouth nightly.       No current facility-administered medications for this visit.        Changes to medications: Jerome Kennedy reports no changes at this time.    Allergies   Allergen Reactions    Morphine Itching and Rash     He gets light headed    Toradol [Ketorolac] Itching       Changes to allergies: No    SPECIALTY MEDICATION ADHERENCE     Humira 40  mg/0.38mL : 0 days of medicine on hand     Medication Adherence    Patient reported X missed doses in the last month: 0  Specialty Medication: HUMIRA(CF) PEN 40 mg/0.4 mL  Patient is on additional specialty medications: No  Patient is on more than two specialty medications: No  Informant: patient          Specialty medication(s) dose(s) confirmed: Regimen is correct and unchanged.  Are there any concerns with adherence? No    Adherence counseling provided? Not needed    CLINICAL MANAGEMENT AND INTERVENTION      Clinical Benefit Assessment:    Do you feel the medicine is effective or helping your condition? Yes    Clinical Benefit counseling provided? Not needed    Adverse Effects Assessment:    Are you experiencing any side effects? No    Are you experiencing difficulty administering your medicine? No    Quality of Life Assessment:    Quality of Life    Rheumatology  Oncology  Dermatology  Cystic Fibrosis          How many days over the past month did your CD  keep you from your normal activities? For example, brushing your teeth or getting up in the morning. 0    Have you discussed this with your provider? Not needed    Acute Infection Status:    Acute infections noted within Epic:  Rule Out C. Diff  Patient reported infection: None    Therapy Appropriateness:    Is therapy appropriate and patient progressing towards therapeutic goals? Yes, therapy is appropriate and should be continued    DISEASE/MEDICATION-SPECIFIC INFORMATION      For patients on injectable medications: Patient currently has 0 doses left.  Next injection is scheduled for 7/3.    Chronic Inflammatory Diseases: Have you experienced any flares in the last month? No  Has this been reported to your provider? Not applicable    PATIENT SPECIFIC NEEDS     Does the patient have any physical, cognitive, or cultural barriers? No    Is the patient high risk? No    Did the patient require a clinical intervention? No    Does the patient require physician intervention or other additional services (i.e., nutrition, smoking cessation, social work)? No    SOCIAL DETERMINANTS OF HEALTH     At the [facility], we have learned that life circumstances - like trouble affording food, housing, utilities, or transportation can affect the health of many of our patients.   That is why we wanted to ask: are you currently experiencing any life circumstances that are negatively impacting your health and/or quality of life? No    Social Determinants of Health     Financial Resource Strain: Low Risk  (01/28/2023)    Overall Financial Resource Strain (CARDIA)     Difficulty of Paying Living Expenses: Not hard at all   Internet Connectivity: Not on file   Food Insecurity: No Food Insecurity (01/28/2023)    Hunger Vital Sign     Worried About Running Out of Food in the Last Year: Never true     Ran Out of Food in the Last Year: Never true   Tobacco Use: High Risk (01/28/2023)    Patient History     Smoking Tobacco Use: Every Day     Smokeless Tobacco Use: Never     Passive Exposure: Past   Housing/Utilities: Low Risk  (01/28/2023)    Housing/Utilities     Within the past 12 months, have you ever stayed: outside, in a car, in a tent, in an overnight shelter, or temporarily in someone else's home (i.e. couch-surfing)?: No     Are you worried about losing your housing?: No     Within the past 12 months, have you been unable to get utilities (heat, electricity) when it was really needed?: No   Alcohol Use: Not on file  Transportation Needs: No Transportation Needs (01/28/2023)    PRAPARE - Therapist, art (Medical): No     Lack of Transportation (Non-Medical): No   Substance Use: Not on file   Health Literacy: Not on file   Physical Activity: Not on File (06/21/2021)    Received from [place] , [place]     Physical Activity     Physical Activity: 0   Interpersonal Safety: Not on file   Stress: Not on File (06/21/2021)    Received from [facility] , [place]     Stress     Stress: 0   Intimate Partner Violence: Not on file   Depression: Not at risk (11/13/2022)    PHQ-2     PHQ-2 Score: 0   Social Connections: Not on File (06/21/2021)    Received from [place] , [place]     Social Connections     Social Connections and Isolation: 0       Would you be willing to receive help with any of the needs that you have identified today? Not applicable       SHIPPING     Specialty Medication(s) to be Shipped:   Inflammatory Disorders: Humira    Other medication(s) to be shipped: No additional medications requested for fill at this time     Changes to insurance: No    Delivery Scheduled: Yes, Expected medication delivery date: 7/3.     Medication will be delivered via Same Day Courier to the confirmed prescription address in [facility].    The patient will receive a drug information handout for each medication shipped and additional FDA Medication Guides as required.  Verified that patient has previously received a Conservation officer, historic buildings and a Surveyor, mining.    The patient or caregiver noted above participated in the development of this care plan and knows that they can request review of or adjustments to the care plan at any time.      All of the patient's questions and concerns have been addressed.    Teofilo Pod, PharmD   [facility] Specialty Pharmacist

## 2023-02-03 NOTE — Unmapped (Signed)
[place] [facility] has been trying to reach patient to coordinate delivery, messages left.

## 2023-02-04 NOTE — Unmapped (Signed)
Jerome Kennedy. 's HUMIRA(CF) PEN 80 mg/0.8 mL Pnkt (adalimumab) shipment will be delayed as a result of prior authorization being required by the patient's insurance.     I have reached out to the patient  at [phone] and communicated the delay. We will call the patient back to reschedule the delivery upon resolution. We have not confirmed the new delivery date.

## 2023-02-06 ENCOUNTER — Ambulatory Visit
Admit: 2023-02-06 | Payer: PRIVATE HEALTH INSURANCE | Attending: Student in an Organized Health Care Education/Training Program | Primary: Student in an Organized Health Care Education/Training Program

## 2023-02-06 NOTE — Unmapped (Unsigned)
[facility]  Psychiatry   New Patient Evaluation - Outpatient    Name: Jerome Kennedy.  Date: 02/06/2023  MRN: [id number]  DOB: Jan 02, 1976  PCP: Aleen Campi, MD    Assessment:     [name]. is a 47 y.o., Black/African American race, Not Hispanic, Latino/a, or Spanish origin ethnicity,  ENGLISH speaking male  with a history of ***, who presents for evaluation of ***.  ***.     Risk Assessment:  A suicide and violence risk assessment was performed as part of this evaluation. There patient is deemed to be at chronic elevated risk for self-harm/suicide given the following factors: {PSY Suicide Risk Factors:22910}. The patient is deemed to be at chronic elevated risk for violence given the following factors: {PSY Violence Risk Factors:22914}. These risk factors are mitigated by the following factors:{PSY Mitigating Factors:22917}. There is no acute risk for suicide or violence at this time. The patient was educated about relevant modifiable risk factors including following recommendations for treatment of psychiatric illness and abstaining from substance abuse.   While future psychiatric events cannot be accurately predicted, the patient does not currently require  acute inpatient psychiatric care and does not currently meet [facility] involuntary commitment criteria.      Diagnoses:   Patient Active Problem List   Diagnosis    Crohn's disease (CMS-HCC)    Exacerbation of Crohn's disease (CMS-HCC)    Abdominal pain    Loose stools    Anemia    Hypoalbuminemia    Asymptomatic microscopic hematuria    C. difficile colitis    Arthritis of carpometacarpal (CMC) joint of left thumb    Periumbilical abdominal pain    Hypomagnesemia    Iron deficiency anemia    Bipolar 1 disorder (CMS-HCC)    COPD (chronic obstructive pulmonary disease) (CMS-HCC)    Homelessness    Hx of deep venous thrombosis    Tobacco use disorder    Dental caries    Hypokalemia       Stressors: ***     Plan:  ***      Return to clinic appointment:     Revised Medication(s) Post Visit:  Outpatient Encounter Medications as of 02/06/2023   Medication Sig Dispense Refill    albuterol HFA 90 mcg/actuation inhaler Inhale 2 puffs every six (6) hours as needed for wheezing. 18 g 0    ascorbic acid, vitamin C, (VITAMIN C) 250 MG tablet Take 1 tablet (250 mg total) by mouth every other day. Take with oral iron 30 tablet 11    budesonide-formoterol (SYMBICORT) 80-4.5 mcg/actuation inhaler Inhale 2 puffs  in the morning and 2 puffs in the evening. 6.9 g 2    calcium carbonate (TUMS) 200 mg calcium (500 mg) chewable tablet Chew 2 tablets (400 mg of elem calcium total) two (2) times a day. 60 tablet 0    cholecalciferol, vitamin D3 25 mcg, 1,000 units,, 1,000 unit (25 mcg) tablet Take 1 tablet (25 mcg total) by mouth daily. 30 tablet 0    cyanocobalamin, vitamin B-12, 1,000 mcg Subl Place 2,000 mcg under the tongue in the morning. 180 tablet 3    empty container Misc Use as directed to dispose of Humira 1 each 3    ferrous sulfate 325 (65 FE) MG tablet Take 1 tablet (325 mg total) by mouth every other day.      fluticasone propionate (FLONASE) 50 mcg/actuation nasal spray 2 sprays into each nostril daily as needed for allergies.  16 g 0    HUMIRA PEN CITRATE FREE 40 MG/0.4 ML Inject the contents of 1 pen (40 mg total) under the skin every seven (7) days. 4 each 5    magnesium oxide (MAG-OX) 400 mg (241.3 mg elemental magnesium) tablet Take 1 tablet (400 mg total) by mouth two (2) times a day.      nicotine (NICODERM CQ) 21 mg/24 hr patch Place 1 patch on the skin daily. 28 patch 0    OPTICHAMBER DIAMOND VHC Spcr       oxyCODONE (ROXICODONE) 5 MG immediate release tablet Take 1 tablet (5 mg total) by mouth daily as needed for pain.      pantoprazole (PROTONIX) 40 MG tablet Take 1 tablet (40 mg total) by mouth daily. 90 tablet 0    [EXPIRED] promethazine (PHENERGAN) 25 MG tablet Take 1 tablet (25 mg total) by mouth every six (6) hours as needed for nausea 15 tablet 0    QUEtiapine (SEROQUEL) 100 MG tablet Take 1 tablet (100 mg total) by mouth nightly.       No facility-administered encounter medications on file as of 02/06/2023.       Patient has been given this writer's contact information as well as the [facility] Psychiatry urgent line number. They have been instructed to call [phone] for emergencies.    [name], MD    Subjective:    Psychiatric Chief Concern:  {Chief Complaint:210228460}    HPI: Patient is a 47 y.o., Black/African American race, Not Hispanic, Latino/a, or Spanish origin ethnicity,  ENGLISH speaking male  with a history of ***.***    Mood Symptoms:    Mood:{MOOD:23791:a} {RRPASTMOODSYMPTOMS:23792:a}  Sleep:{ABHSLEEP:47363:a}  interests:   hopelessness/helplessness:   activity level/energy: {RRENERGY:24027:a}  concentration: {RR CONCENTRATION:24029:a}  appetite: {RRAPPETITE:24044:a}  psychomotor: {RRPSYCHOMOTOR:24047:a}  motivation: {RRMOTIVATION:24051:a}  suicidality: {RRENDORSES/DENIES:24054:a}{RRSUICIDALITY:24055:a}  homicidality: {RRENDORSES/DENIES:24054:a}  irritability: DENIES    Anxiety Symptoms:  Baseline anxiety: {RRBASELINEANXIETY:24061:a}  Rumination, excessive worry: {RRRUMINATION/EXCESSIVE WORRY:24064:a}  Obsessions, compulsions: {RRobsessions/compulsions:24188}  Panic attacks: {RRPANICATTACKS:24190}  Nightmares, flashbacks, avoidance: {RRPTSDSYMPTOMS:24193}    Psychotic Symptoms:   {RRPSYCHOTICSYMPTOMS:24201}    Substance Abuse:   ETOH: {RRALCOHOLUSE:24195}  Illicit: {RRILLICTDRUGUSE:24199}  Licit: {RRLICITDRUGUSE:24200}    Cognitive Symptoms:   ADLs:  {RRADLS:24203}  IADLs: {rr:24205}  Memory/recall: {RRMEMORY/RECALL:24206}  Concentration/task completion: {RRCONCENTRATION/TASKCOMPLETION:24208}    Allergies:  {GSC Allergies:30421601}    Medications:   {Sur Gen Med list:[phone]}    Psychiatric/Medical History:  {GSC Past Medical History:30421616}    Surgical History:  {GSC Past Surgical History:30421619}    Social History:  Social History     Socioeconomic History    Marital status: Divorced    Number of children: 1   Tobacco Use    Smoking status: Every Day     Current packs/day: 0.30     Average packs/day: 0.5 packs/day for 21.3 years (10.2 ttl pk-yrs)     Types: Cigarettes     Start date: 10/02/2001     Passive exposure: Past    Smokeless tobacco: Never    Tobacco comments:     Reduced use from 10cpd to 5cpd, though relights each one. Motivated to quit.    Vaping Use    Vaping status: Some Days    Substances: Nicotine    Passive vaping exposure: Yes   Substance and Sexual Activity    Alcohol use: No     Alcohol/week: 0.0 standard drinks of alcohol    Drug use: Yes     Types: Marijuana     Comment: Smokes 2-3  joints/day    Sexual activity: Not Currently     Partners: Female     Birth control/protection: Condom   Social History Narrative    Recently moved to the [place] area, lives with cousin     Social Determinants of Health     Financial Resource Strain: Low Risk  (01/28/2023)    Overall Financial Resource Strain (CARDIA)     Difficulty of Paying Living Expenses: Not hard at all   Food Insecurity: No Food Insecurity (01/28/2023)    Hunger Vital Sign     Worried About Running Out of Food in the Last Year: Never true     Ran Out of Food in the Last Year: Never true   Transportation Needs: No Transportation Needs (01/28/2023)    PRAPARE - Therapist, art (Medical): No     Lack of Transportation (Non-Medical): No   Physical Activity: Not on File (06/21/2021)    Received from [place], [place]    Physical Activity     Physical Activity: 0   Stress: Not on File (06/21/2021)    Received from [facility], [place]    Stress     Stress: 0   Social Connections: Not on File (06/21/2021)    Received from [place], [place]    Social Connections     Social Connections and Isolation: 0       Family History:  {GSC Family History:30421602}    ROS:   The balance of 10 systems reviewed is negative except as per HPI.     Objective:     Vitals:   There were no vitals filed for this visit.    Mental Status Exam:  Appearance:    {PSY Appearance:23008}   Motor:   {PSY Motor:23010}   Speech/Language:    {PSY Speech/Lang:23011}   Mood:   {PSY Mood:23012}   Affect:   {PSY Affect:23013}   Thought process:   {PSY Thought Process:23015}   Thought content:     {PSY Thought Content:23016}   Perceptual disturbances:     {PSY Perceptual Disturbances:23017}     Orientation:   {PSY Orientation:23018}   Attention:   {PSY Attention:23019}   Concentration:   {PSY Concentration:23020}   Memory:   {PSY Memory:23021}    Fund of knowledge:    {PSY Fund of Knowledge:23022}   Insight:     {PSY Insight:23023}   Judgment:    {PSY Judgment:23024}   Impulse Control:   {PSY Impulse Control:23555}     PE:   Vital signs were reviewed.  The patient sat comfortably in chair.  The patient's breathing was observed to be comfortable and normal.  The patient is in no acute distress.  All extremity movements appear intact with normal strength and no abnormalities.  Gait is normal.  There are no focal neurological deficits observed.     Test Results:  Data Review: {PSY Labs:23633}  Imaging: {PSY Imaging findings:23611}    Psychometrics:         [name], MD

## 2023-02-09 MED FILL — HUMIRA PEN CITRATE FREE 40 MG/0.4 ML: SUBCUTANEOUS | 28 days supply | Qty: 4 | Fill #1

## 2023-02-09 NOTE — Unmapped (Signed)
Jerome Kennedy. 's HUMIRA(CF) PEN 40 mg/0.4 mL injection (adalimumab) shipment will be sent out  as a result of prior authorization now approved.     I have reached out to the patient  at [phone] and communicated the delivery change. We will reschedule the medication for the delivery date that the patient agreed upon.  We have confirmed the delivery date as 02/09/23

## 2023-02-11 ENCOUNTER — Ambulatory Visit: Admit: 2023-02-11 | Discharge: 2023-02-12

## 2023-02-11 DIAGNOSIS — K50019 Crohn's disease of small intestine with unspecified complications: Principal | ICD-10-CM

## 2023-02-11 NOTE — Unmapped (Signed)
COMMUNITY HEALTH WORKER  Outreach Note    02/11/2023  Inform/Regulatory Low priority, no response needed unless change in care.     I am reaching out to [employer]. as part of the community health worker team regarding his Resource Coordination.    Patient advised to  reach out to community health care worker if any resources are needed .      Next appt with PCP: n/a     See chart for med list if needed    Sharing communication as part of regulatory requirements.       Referral:    Referral received from: Dalbert Garnet   Reason for referral: Resource Coordination    Assessment:    Patients Primary Concern: Housing instability  Patient Barriers: No barriers identified during this encounter  Patient Strengths: Self-advocacy    Additional Background Information provided by patient: Spoke to Mr Basnett who states he was able to find stable housing through [employer]. He has moved in for two weeks now . Patient states no further resources are needed at this time.     Food Insecurity: No Food Insecurity (01/28/2023)    Hunger Vital Sign     Worried About Running Out of Food in the Last Year: Never true     Ran Out of Food in the Last Year: Never true     Transportation Needs: No Transportation Needs (01/28/2023)    PRAPARE - Therapist, art (Medical): No     Lack of Transportation (Non-Medical): No     Financial Resource Strain: Low Risk  (01/28/2023)    Overall Financial Resource Strain (CARDIA)     Difficulty of Paying Living Expenses: Not hard at all     Housing/Utilities: Low Risk  (01/28/2023)    Housing/Utilities     Within the past 12 months, have you ever stayed: outside, in a car, in a tent, in an overnight shelter, or temporarily in someone else's home (i.e. couch-surfing)?: No     Are you worried about losing your housing?: No     Within the past 12 months, have you been unable to get utilities (heat, electricity) when it was really needed?: No     Health Literacy: Not on file      Social Connections: Not on File (06/21/2021)    Received from [place], [place]    Social Connections     Social Connections and Isolation: 0      The intervention was Supportive listening     Patient expressed understanding.    Next Follow-up: No additional services needed    Note Routed: Yes.  Routed to: William Dalton MD    Signed: Paulla Fore, CNA

## 2023-02-22 ENCOUNTER — Ambulatory Visit
Admit: 2023-02-22 | Discharge: 2023-02-23 | Disposition: A | Discharge status: Home / Self Care | Payer: PRIVATE HEALTH INSURANCE | Attending: Emergency Medicine

## 2023-02-22 ENCOUNTER — Emergency Department
Admit: 2023-02-22 | Discharge: 2023-02-23 | Disposition: A | Discharge status: Home / Self Care | Payer: PRIVATE HEALTH INSURANCE | Attending: Emergency Medicine

## 2023-02-22 LAB — COMPREHENSIVE METABOLIC PANEL
ALBUMIN: 3 g/dL — ABNORMAL LOW (ref 3.4–5.0)
ALKALINE PHOSPHATASE: 124 U/L — ABNORMAL HIGH (ref 46–116)
ALT (SGPT): 10 U/L (ref 10–49)
ANION GAP: 5 mmol/L (ref 5–14)
AST (SGOT): 16 U/L (ref <=34)
BILIRUBIN TOTAL: 0.3 mg/dL (ref 0.3–1.2)
BLOOD UREA NITROGEN: 13 mg/dL (ref 9–23)
BUN / CREAT RATIO: 13
CALCIUM: 8.9 mg/dL (ref 8.7–10.4)
CHLORIDE: 114 mmol/L — ABNORMAL HIGH (ref 98–107)
CO2: 26 mmol/L (ref 20.0–31.0)
CREATININE: 0.99 mg/dL
EGFR CKD-EPI (2021) MALE: >90 mL/min/{1.73_m2} (ref >=60)
GLUCOSE RANDOM: 100 mg/dL (ref 70–179)
POTASSIUM: 3.7 mmol/L (ref 3.4–4.8)
PROTEIN TOTAL: 6 g/dL (ref 5.7–8.2)
SODIUM: 145 mmol/L (ref 135–145)

## 2023-02-22 LAB — CBC W/ AUTO DIFF
BASOPHILS ABSOLUTE COUNT: 0 10*9/L (ref 0.0–0.1)
BASOPHILS RELATIVE PERCENT: 0.7 %
EOSINOPHILS ABSOLUTE COUNT: 0.1 10*9/L (ref 0.0–0.5)
EOSINOPHILS RELATIVE PERCENT: 1.7 %
HEMATOCRIT: 40.2 % (ref 39.0–48.0)
HEMOGLOBIN: 13.3 g/dL (ref 12.9–16.5)
LYMPHOCYTES ABSOLUTE COUNT: 2 10*9/L (ref 1.1–3.6)
LYMPHOCYTES RELATIVE PERCENT: 30.6 %
MEAN CORPUSCULAR HEMOGLOBIN CONC: 33.1 g/dL (ref 32.0–36.0)
MEAN CORPUSCULAR HEMOGLOBIN: 27.8 pg (ref 25.9–32.4)
MEAN CORPUSCULAR VOLUME: 84.1 fL (ref 77.6–95.7)
MEAN PLATELET VOLUME: 8.3 fL (ref 6.8–10.7)
MONOCYTES ABSOLUTE COUNT: 0.4 10*9/L (ref 0.3–0.8)
MONOCYTES RELATIVE PERCENT: 6.8 %
NEUTROPHILS ABSOLUTE COUNT: 4 10*9/L (ref 1.8–7.8)
NEUTROPHILS RELATIVE PERCENT: 60.2 %
PLATELET COUNT: 227 10*9/L (ref 150–450)
RED BLOOD CELL COUNT: 4.78 10*12/L (ref 4.26–5.60)
RED CELL DISTRIBUTION WIDTH: 15.6 % — ABNORMAL HIGH (ref 12.2–15.2)
WBC ADJUSTED: 6.6 10*9/L (ref 3.6–11.2)

## 2023-02-22 LAB — LACTATE, VENOUS, WHOLE BLOOD: LACTATE BLOOD VENOUS: 1.9 mmol/L — ABNORMAL HIGH (ref 0.5–1.8)

## 2023-02-22 LAB — PROTIME-INR
INR: 1.08
PROTIME: 12.1 s (ref 9.9–12.6)

## 2023-02-23 LAB — LACTATE, VENOUS, WHOLE BLOOD: LACTATE BLOOD VENOUS: 0.6 mmol/L (ref 0.5–1.8)

## 2023-02-23 MED ADMIN — acetaminophen (TYLENOL) tablet 650 mg: 650 mg | ORAL | @ 06:00:00 | Stop: 2023-02-23 | NDC 50580048790

## 2023-02-23 MED ADMIN — HYDROmorphone (PF) (DILAUDID) injection 1 mg: 1 mg | INTRAVENOUS | @ 01:00:00 | Stop: 2023-02-22 | NDC 60687056686

## 2023-02-23 MED ADMIN — HYDROmorphone (PF) (DILAUDID) injection 1 mg: 1 mg | INTRAVENOUS | @ 02:00:00 | Stop: 2023-02-22 | NDC 60687056686

## 2023-02-23 MED ADMIN — metoclopramide (REGLAN) injection 10 mg: 10 mg | INTRAVENOUS | @ 01:00:00 | Stop: 2023-02-22 | NDC 00031670442

## 2023-02-23 MED ADMIN — lactated ringers bolus 1,000 mL: 1000 mL | INTRAVENOUS | @ 01:00:00 | Stop: 2023-02-22 | NDC 53191040901

## 2023-02-23 MED ADMIN — iohexol (OMNIPAQUE) 350 mg iodine/mL solution 100 mL: 100 mL | INTRAVENOUS | @ 02:00:00 | Stop: 2023-02-22 | NDC 00407141498

## 2023-02-23 NOTE — Unmapped (Signed)
[facility] Provider Note      ED Course, Assessment and Plan     Initial Clinical Impression:    February 22, 2023 8:19 PM   Jerome Kennedy. is a 47 y.o. male hx of Crohns, COPD, DVT, Bipolar, Anxiety, Depression who presents by EMS for abdominal pain as described below. On exam, Vital signs stable.  Overall well-appearing, well nourished, uncomfortable but in no acute distress.  Normal cardiopulmonary exam. Abdomen is soft with umbilical hernia appreciated, reducible. Normal neurologic exam.    BP 110/81  - Pulse 76  - Temp 36.4 ??C (97.6 ??F) (Oral)  - Resp 18  - SpO2 98%     Differential diagnosis includes Crohns flaire, SBO, incarcerated vs strangulated hernia, mesenteric ischemia among multiple other etiologies    Will obtain labs, lactate, and CT abdomen pelvis. Will treat patient with fluids and medications as needed. If CT shows concern for SBO will page Surgery for consultation.    Discussion of Management with other Physicians, QHP, or Appropriate Source:   Independent Interpretation of Studies:   External Records Reviewed:   Escalation of Care, Consideration of Admission/Observation/Transfer:   Social determinants that significantly affected care: Social Determinants that significantly affected care: Lacks transportation  Prescription drug(s) considered but not prescribed:   Diagnostic tests considered but not performed:     ED Course:    @0155hrs : Pt is resting comfortably and in no acute distress. Requested a sandwich and something to drink. Discussed findings from imaging study and workup. Recommend following up with his GI. Given return precautions and all questions answered. Pt does request a ride home.           _____________________________________________________________________    The case was discussed with attending physician who is in agreement with the above assessment and plan    Dictation software was used while making this note. Please excuse any errors made with dictation software.    Additional Medical Decision Making     I have reviewed the vital signs and the nursing notes. Labs and radiology results that were available during my care of the patient were independently reviewed by me and considered in my medical decision making.     I independently visualized the EKG tracing if performed  I independently visualized the radiology images if performed  I reviewed the patient's prior medical records if available.  Additional history obtained from family if available    History     CHIEF COMPLAINT:   Chief Complaint   Patient presents with    Abdominal Pain       HPI: Jerome Kennedy. is a 47 y.o. male x of Crohns, COPD, DVT, Bipolar, Anxiety, Depression who presents by EMS for abdominal pain. Pt stated he had two days of abdominal pain and felt similar to bowel obstructions he has had in the past. Has been without his Crohns med's for a week due to delay in PCP refilling his prescription. Pt stated he came in today because of the pain and nausea. Denies fevers. Has been making bowel movements although darker in nature. No blood appreciated.     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    Myalgia     Rhinovirus infection     SBO (small bowel obstruction) (CMS-HCC) 2011       Past Surgical History:   Procedure Laterality Date    CHOLECYSTECTOMY      COLON SURGERY      partial resection    HERNIA REPAIR      PR CLOSE ENTEROSTOMY,RESEC+COLOREC ANAS Midline 11/01/2019 Procedure: CLO ENTEROSTOMY; W/RESECT COLORECTAL ANASTOM;  Surgeon: Claretta Fraise, MD;  Location: MAIN OR [facility];  Service: Gastrointestinal    PR COLONOSCOPY FLX DX W/COLLJ SPEC WHEN PFRMD Left 01/07/2013    Procedure: COLONOSCOPY, FLEXIBLE, PROXIMAL TO SPLENIC FLEXURE; DIAGNOSTIC, W/WO COLLECTION SPECIMEN BY BRUSH OR WASH;  Surgeon: Tish Men, MD;  [facility];  Service: Gastroenterology    PR COLONOSCOPY FLX DX W/COLLJ SPEC WHEN PFRMD  03/09/2015    Procedure: COLONOSCOPY, FLEXIBLE, PROXIMAL TO SPLENIC FLEXURE; DIAGNOSTIC, W/WO COLLECTION SPECIMEN BY BRUSH OR WASH;  Surgeon: Cletis Athens, MD;  Location: [facility];  Service: Gastroenterology    PR COLONOSCOPY FLX DX W/COLLJ SPEC WHEN PFRMD N/A 10/28/2019    Procedure: COLONOSCOPY, FLEXIBLE, PROXIMAL TO SPLENIC FLEXURE; DIAGNOSTIC, W/WO COLLECTION SPECIMEN BY BRUSH OR WASH;  Surgeon: Beverly Milch, MD;  Location: [facility];  Service: Gastroenterology    PR COLONOSCOPY FLX DX W/COLLJ SPEC WHEN PFRMD N/A 10/01/2022    Procedure: COLONOSCOPY, FLEXIBLE, PROXIMAL TO SPLENIC FLEXURE; DIAGNOSTIC, W/WO COLLECTION SPECIMEN BY BRUSH OR WASH;  Surgeon: Zetta Bills, MD;  Location: [facility];  Service: Gastroenterology    PR EXPLORATORY OF ABDOMEN Midline 09/02/2019    Procedure: EXPLORATORY LAPAROTOMY, EXPLORATORY CELIOTOMY WITH OR WITHOUT BIOPSY(S);  Surgeon: Claretta Fraise, MD;  Location: MAIN OR [facility];  Service: Gastrointestinal    PR EXPLORATORY OF ABDOMEN Midline 11/01/2019    Procedure: EXPLORATORY LAPAROTOMY, EXPLORATORY CELIOTOMY WITH OR WITHOUT BIOPSY(S);  Surgeon: Claretta Fraise, MD;  Location: MAIN OR [facility];  Service: Gastrointestinal    PR FIX FINGER,VOLAR PLATE,I-P JT Left 08/07/2022    Procedure: REPAIR AND RECONSTRUCTION, FINGER, VOLAR PLATE, INTERPHALANGEAL JOINT;  Surgeon: [name], MD;  Location: MAIN OR [facility];  Service: Orthopedics    PR FREEING BOWEL ADHESION,ENTEROLYSIS N/A 11/01/2019    Procedure: Enterolysis (Separt Proc);  Surgeon: Claretta Fraise, MD;  Location: MAIN OR [facility];  Service: Gastrointestinal    PR ILEOSCOPY THRU STOMA,BIOPSY N/A 10/14/2019    Procedure: Karren Cobble; [place] Saa 1/MX;  Surgeon: Neysa Hotter, MD;  Location: [facility];  Service: Gastroenterology    PR PART REMOVAL COLON W COLOSTOMY Midline 09/02/2019    Procedure: COLECTOMY, PARTIAL; WITH SKIN LEVEL CECOSTOMY OR COLOSTOMY;  Surgeon: Claretta Fraise, MD;  Location: MAIN OR [facility];  Service: Gastrointestinal    PR REPAIR INCISIONAL HERNIA,REDUCIBLE Midline 09/02/2019    Procedure: REPAIR INIT INCISIONAL OR VENTRAL HERNIA; REDUCIBLE;  Surgeon: Kristopher Oppenheim, MD;  Location: MAIN OR [facility];  Service: Trauma    PR REPAIR INTERCARP/CARP-METACARP JT Left 08/07/2022    Procedure: Jorene Guest;  Surgeon: [name], MD;  Location: MAIN OR [facility];  Service: Orthopedics    PR UPPER GI ENDOSCOPY,BIOPSY N/A 01/07/2013    Procedure: UGI ENDOSCOPY; WITH  BIOPSY, SINGLE OR MULTIPLE;  Surgeon: Tish Men, MD;  [facility]: [facility];  Service: Gastroenterology    PR UPPER GI ENDOSCOPY,BIOPSY N/A 10/14/2019    Procedure: UGI ENDOSCOPY; WITH BIOPSY, SINGLE OR MULTIPLE;  Surgeon: Neysa Hotter, MD;  [facility]: [facility];  Service: Gastroenterology    PR UPPER GI ENDOSCOPY,DIAGNOSIS N/A 10/01/2022    Procedure: UGI ENDO, INCLUDE ESOPHAGUS, STOMACH, & DUODENUM &/OR JEJUNUM; DX W/WO COLLECTION SPECIMN, BY BRUSH OR WASH;  Surgeon: Zetta Bills, MD;  [facility];  Service: Gastroenterology       MEDICATIONS:   No current facility-administered medications for this encounter.    Current Outpatient Medications:     albuterol HFA 90 mcg/actuation inhaler, Inhale 2 puffs every six (6) hours as needed for wheezing., Disp: 18 g, Rfl: 0    ascorbic acid, vitamin C, (VITAMIN C) 250 MG tablet, Take 1 tablet (250 mg total) by mouth every other day. Take with oral iron, Disp: 30 tablet, Rfl: 11    budesonide-formoterol (SYMBICORT) 80-4.5 mcg/actuation inhaler, Inhale 2 puffs  in the morning and 2 puffs in the evening., Disp: 6.9 g, Rfl: 2    calcium carbonate (TUMS) 200 mg calcium (500 mg) chewable tablet, Chew 2 tablets (400 mg of elem calcium total) two (2) times a day., Disp: 60 tablet, Rfl: 0    cholecalciferol, vitamin D3 25 mcg, 1,000 units,, 1,000 unit (25 mcg) tablet, Take 1 tablet (25 mcg total) by mouth daily., Disp: 30 tablet, Rfl: 0    cyanocobalamin, vitamin B-12, 1,000 mcg Subl, Place 2,000 mcg under the tongue in the morning., Disp: 180 tablet, Rfl: 3    empty container Misc, Use as directed to dispose of Humira, Disp: 1 each, Rfl: 3    ferrous sulfate 325 (65 FE) MG tablet, Take 1 tablet (325 mg total) by mouth every other day., Disp: , Rfl:     fluticasone propionate (FLONASE) 50 mcg/actuation nasal spray, 2 sprays into each nostril daily as needed for allergies., Disp: 16 g, Rfl: 0    HUMIRA PEN CITRATE FREE 40 MG/0.4 ML, Inject the contents of 1 pen (40 mg total) under the skin every seven (7) days., Disp: 4 each, Rfl: 5    magnesium oxide (MAG-OX) 400 mg (241.3 mg elemental magnesium) tablet, Take 1 tablet (400 mg total) by mouth two (2) times a day., Disp: , Rfl:     nicotine (NICODERM CQ) 21 mg/24 hr patch, Place 1 patch on the skin daily., Disp: 28 patch, Rfl: 0    OPTICHAMBER DIAMOND VHC Spcr, , Disp: , Rfl:     oxyCODONE (ROXICODONE) 5 MG immediate release tablet, Take 1 tablet (5 mg total) by mouth daily as needed for pain., Disp: , Rfl:     pantoprazole (PROTONIX) 40 MG tablet, Take 1 tablet (40 mg total) by mouth daily., Disp: 90 tablet, Rfl: 0    QUEtiapine (SEROQUEL) 100 MG tablet, Take 1 tablet (100 mg total) by mouth nightly., Disp: , Rfl:     ALLERGIES:   Morphine and Toradol [ketorolac]    SOCIAL HISTORY:   Social History     Tobacco Use    Smoking status: Every Day Current packs/day: 0.30     Average packs/day: 0.5 packs/day for 21.4 years (10.2 ttl pk-yrs)     Types: Cigarettes     Start date: 10/02/2001     Passive exposure: Past    Smokeless tobacco: Never    Tobacco comments:  Reduced use from 10cpd to 5cpd, though relights each one. Motivated to quit.    Substance Use Topics    Alcohol use: No     Alcohol/week: 0.0 standard drinks of alcohol       FAMILY HISTORY:  Family History   Problem Relation Age of Onset    Breast cancer Mother     Diabetes Maternal Aunt     Colon cancer Maternal Grandmother     Prostate cancer Maternal Grandfather     Cancer Paternal Grandmother     Crohn's disease Neg Hx         Physical Exam     VITAL SIGNS:    BP 110/81  - Pulse 76  - Temp 36.4 ??C (97.6 ??F) (Oral)  - Resp 18  - SpO2 98%     Constitutional: Alert and oriented. Uncomfortable and in mild discomfort.  Eyes: Conjunctivae are normal.  ENT       Head: Normocephalic and atraumatic.       Nose: No congestion.       Mouth/Throat: Mucous membranes are moist.       Neck: No stridor.  Cardiovascular: Normal rate, regular rhythm. 2+ radial pulses equal bilaterally. <2 second cap refill.  Respiratory: Normal respiratory effort. Breath sounds are normal.  Gastrointestinal: Soft, umbilical hernia, no rebound or guarding.   Genitourinary: No suprapubic tenderness  Musculoskeletal: Normal range of motion in all extremities.   Neurologic: Normal speech and language. No gross focal neurologic deficits are appreciated.  Skin: Skin is warm, dry. No rash noted.  Psychiatric: Mood and affect are normal. Speech and behavior are normal.         Radiology     CT Abdomen Pelvis W IV Contrast Only   Final Result      No acute visceral findings in the abdomen or pelvis.      Sequela of chronic central DVT with prominent abdominal wall venous vasculature. Heterogeneous opacification of the bilateral iliac venous vasculature which may be due to mixing artifact versus DVT. If clinical concern for acute venous thrombosis, recommend lower extremity Doppler.               ECG 12 Lead    Result Date: 02/23/2023  NORMAL SINUS RHYTHM WHEN COMPARED WITH ECG OF 30-Dec-2022 00:15, NO SIGNIFICANT CHANGE WAS FOUND Confirmed by Vickey Huger [phone]) on 02/23/2023 11:09:44 PM    CT Abdomen Pelvis W IV Contrast Only    Result Date: 02/22/2023  EXAM: CT ABDOMEN PELVIS W CONTRAST ACCESSION: [id number] UN CLINICAL INDICATION: 47 years old with crohns abdominal pain, c/f SBO  COMPARISON: CT abdomen/pelvis with contrast 11/27/2022 TECHNIQUE: A helical CT scan of the abdomen and pelvis was obtained following IV contrast from the lung bases through the pubic symphysis. Images were reconstructed in the axial plane. Coronal and sagittal reformatted images were also provided for further evaluation. FINDINGS: LOWER CHEST: Unremarkable. LIVER: Normal liver contour.  No focal liver lesions. BILIARY: The gallbladder is surgically absent. No biliary ductal dilatation.  SPLEEN: Normal in size and contour. PANCREAS: Normal pancreatic contour.  No focal lesions.  No ductal dilation. ADRENAL GLANDS: Normal appearance of the adrenal glands. KIDNEYS/URETERS: Symmetric renal enhancement.  No hydronephrosis.  No solid renal mass. BLADDER: Unremarkable. REPRODUCTIVE ORGANS: Unremarkable prostate. GI TRACT: Redemonstrated postsurgical change of ileocecectomy and right hemicolectomy, with ileocolic anastomosis. Moderate gaseous distention of the stomach. No dilated loops of small bowel. Moderate colonic stool burden. No findings of bowel obstruction or acute inflammation.  PERITONEUM, RETROPERITONEUM AND MESENTERY: No free air.  No ascites.  No fluid collection. LYMPH NODES: Redemonstrated several borderline enlarged mesenteric lymph nodes, largest measuring up to 1.1 cm in short axis (2:56). VESSELS: Hepatic and portal veins are patent.  Normal caliber aorta. Dilated venous vasculature of the left and right chest walls as can be seen with chronic central venous obstruction. Heterogeneous and slightly asymmetric opacification of the bilateral iliac vessels. BONES and SOFT TISSUES: No aggressive osseous lesions.  Redemonstrated marked rectus diastases.     No acute visceral findings in the abdomen or pelvis. Sequela of chronic central DVT with prominent abdominal wall venous vasculature. Heterogeneous opacification of the bilateral iliac venous vasculature which may be due to mixing artifact versus DVT. If clinical concern for acute venous thrombosis, recommend lower extremity Doppler.          Pertinent labs & imaging results that were available during my care of the patient were reviewed by me and considered in my medical decision making (see chart for details).    Please note- This chart has been created using [employer]. Chart creation errors have been sought, but may not always be located and such creation errors, especially pronoun confusion, do NOT reflect on the standard of medical care.       Rico Junker, MD  Resident  02/24/23 [phone]

## 2023-02-23 NOTE — Unmapped (Signed)
Pt here with [facility] for c/o abd pain   Now with dark stool.   Ha diarrhea since yesterday  Hx of abd surg 2 to chron's  VSS upon arrival

## 2023-02-26 NOTE — Unmapped (Signed)
[facility] Population Health   Care Management Progress Note             Date of Service:  02/26/2023      Service:  Care Management     Purpose of contact:         SW followed up on patient request to assist with fall alert. SW and [facility] discussed option of getting service through TCM program. SW called [facility] [phone]). [facility] placed request for care manager for [place]. SW provided updated number in [facility]'s system for [place]. Agent reports that Medicaid may cover device cost, but not the monthly cost of the device. SW contacted [facility] to see if they have other ideas for patient.     Plan:  Will wait for response from [facility] and give [facility] CM 2 weeks to contact [place]. Will follow-back up to see if he's been connected with resources.    Provider/Care Partner(s) to follow up on: N/A      Sherran Needs, LCSW  Care Manager  [facility]  [phone]

## 2023-02-26 NOTE — Unmapped (Signed)
Referral Request     Patient Name: Jerome [facility].   Caller: Self (Patient)  Contact Method: Telephone Call: Time- Any Time [phone]   Referral Requested to: Dentistry  Preferred Facility: [facility]  Reason for Referral: Dental  Check Epic for Referral Order - Present?: Yes  Patient has not received a call back to schedule

## 2023-02-26 NOTE — Unmapped (Signed)
Called patient back  Left vmail  Can call Dentistry directly to schedule at [phone]

## 2023-03-04 NOTE — Unmapped (Signed)
[facility] Refill Coordination Note    Specialty Medication(s) to be Shipped:   Inflammatory Disorders: Humira    Other medication(s) to be shipped: sharps kit     Jerome [facility]., DOB: Oct 19, 1975  Phone: [phone] (home)       All above HIPAA information was verified with patient.     Was a Nurse, learning disability used for this call? No    Completed refill call assessment today to schedule patient's medication shipment from the [facility] [phone]).  All relevant notes have been reviewed.     Specialty medication(s) and dose(s) confirmed: Regimen is correct and unchanged.   Changes to medications: Deveon reports no changes at this time.  Changes to insurance: No  New side effects reported not previously addressed with a pharmacist or physician: None reported  Questions for the pharmacist: No    Confirmed patient received a Conservation officer, historic buildings and a Surveyor, mining with first shipment. The patient will receive a drug information handout for each medication shipped and additional FDA Medication Guides as required.       DISEASE/MEDICATION-SPECIFIC INFORMATION        For patients on injectable medications: Patient currently has 0 doses left.  Next injection is scheduled for 8/4.    SPECIALTY MEDICATION ADHERENCE     Medication Adherence    Patient reported X missed doses in the last month: 0  Specialty Medication: HUMIRA(CF) PEN 40 mg/0.4 mL  Patient is on additional specialty medications: No              Were doses missed due to medication being on hold? No    HUMIRA(CF) PEN 40 mg/0.4 mL   : 0 days of medicine on hand       REFERRAL TO PHARMACIST     Referral to the pharmacist: Not needed      [facility]     Shipping address confirmed in Epic.       Delivery Scheduled: Yes, Expected medication delivery date: 8/2.     Medication will be delivered via Same Day Courier to the prescription address in Epic WAM.    Westley Gambles   [facility] Specialty Technician

## 2023-03-06 MED FILL — EMPTY CONTAINER: 120 days supply | Qty: 1 | Fill #2

## 2023-03-06 MED FILL — HUMIRA PEN CITRATE FREE 40 MG/0.4 ML: SUBCUTANEOUS | 28 days supply | Qty: 4 | Fill #2

## 2023-03-17 NOTE — Unmapped (Signed)
[facility] Family Medicine Population Health   Care Management Progress Note             Date of Service:  03/17/2023      Service:  Care Management     Purpose of contact:         SW left vm with contact numbers for [facility] and this SW cb number.  Patient provided with SW contact information. SW remains available to assist with patient's care.     Provider/Care Partner(s) to follow up on: N/A      Sherran Needs, LCSW  Care Manager  [facility]  [phone]

## 2023-03-18 ENCOUNTER — Ambulatory Visit
Admit: 2023-03-18 | Payer: PRIVATE HEALTH INSURANCE | Attending: Student in an Organized Health Care Education/Training Program | Primary: Student in an Organized Health Care Education/Training Program

## 2023-03-18 NOTE — Unmapped (Unsigned)
[facility]  Psychiatry   New Patient Evaluation - Outpatient    Name: Jerome Kennedy.  Date: 03/18/2023  MRN: [id number]  DOB: July 08, 1976  PCP: Aleen Campi, MD    Assessment:     Jerome Kennedy. is a 47 y.o., Black/African American race, Not Hispanic, Latino/a, or Spanish origin ethnicity,  ENGLISH speaking male  with a history of ***, who presents for evaluation of ***.  ***.     Risk Assessment:  A suicide and violence risk assessment was performed as part of this evaluation. There patient is deemed to be at chronic elevated risk for self-harm/suicide given the following factors: {PSY Suicide Risk Factors:22910}. The patient is deemed to be at chronic elevated risk for violence given the following factors: {PSY Violence Risk Factors:22914}. These risk factors are mitigated by the following factors:{PSY Mitigating Factors:22917}. There is no acute risk for suicide or violence at this time. The patient was educated about relevant modifiable risk factors including following recommendations for treatment of psychiatric illness and abstaining from substance abuse.   While future psychiatric events cannot be accurately predicted, the patient does not currently require  acute inpatient psychiatric care and does not currently meet [facility] involuntary commitment criteria.      Diagnoses:   Patient Active Problem List   Diagnosis    Crohn's disease (CMS-HCC)    Exacerbation of Crohn's disease (CMS-HCC)    Abdominal pain    Loose stools    Anemia    Hypoalbuminemia    Asymptomatic microscopic hematuria    C. difficile colitis    Arthritis of carpometacarpal (CMC) joint of left thumb    Periumbilical abdominal pain    Hypomagnesemia    Iron deficiency anemia    Bipolar 1 disorder (CMS-HCC)    COPD (chronic obstructive pulmonary disease) (CMS-HCC)    Homelessness    Hx of deep venous thrombosis    Tobacco use disorder    Dental caries    Hypokalemia       Stressors: ***     Plan:  ***      Return to clinic appointment:     Revised Medication(s) Post Visit:  Outpatient Encounter Medications as of 03/18/2023   Medication Sig Dispense Refill    albuterol HFA 90 mcg/actuation inhaler Inhale 2 puffs every six (6) hours as needed for wheezing. 18 g 0    ascorbic acid, vitamin C, (VITAMIN C) 250 MG tablet Take 1 tablet (250 mg total) by mouth every other day. Take with oral iron 30 tablet 11    budesonide-formoterol (SYMBICORT) 80-4.5 mcg/actuation inhaler Inhale 2 puffs  in the morning and 2 puffs in the evening. 6.9 g 2    calcium carbonate (TUMS) 200 mg calcium (500 mg) chewable tablet Chew 2 tablets (400 mg of elem calcium total) two (2) times a day. 60 tablet 0    cholecalciferol, vitamin D3 25 mcg, 1,000 units,, 1,000 unit (25 mcg) tablet Take 1 tablet (25 mcg total) by mouth daily. 30 tablet 0    cyanocobalamin, vitamin B-12, 1,000 mcg Subl Place 2,000 mcg under the tongue in the morning. 180 tablet 3    empty container Misc Use as directed to dispose of Humira 1 each 3    ferrous sulfate 325 (65 FE) MG tablet Take 1 tablet (325 mg total) by mouth every other day.      fluticasone propionate (FLONASE) 50 mcg/actuation nasal spray 2 sprays into each nostril daily as needed for allergies.  16 g 0    HUMIRA PEN CITRATE FREE 40 MG/0.4 ML Inject the contents of 1 pen (40 mg total) under the skin every seven (7) days. 4 each 5    magnesium oxide (MAG-OX) 400 mg (241.3 mg elemental magnesium) tablet Take 1 tablet (400 mg total) by mouth two (2) times a day.      nicotine (NICODERM CQ) 21 mg/24 hr patch Place 1 patch on the skin daily. 28 patch 0    OPTICHAMBER DIAMOND VHC Spcr       oxyCODONE (ROXICODONE) 5 MG immediate release tablet Take 1 tablet (5 mg total) by mouth daily as needed for pain.      pantoprazole (PROTONIX) 40 MG tablet Take 1 tablet (40 mg total) by mouth daily. 90 tablet 0    QUEtiapine (SEROQUEL) 100 MG tablet Take 1 tablet (100 mg total) by mouth nightly.       No facility-administered encounter medications on file as of 03/18/2023.       Patient has been given this writer's contact information as well as the [facility] Psychiatry urgent line number. They have been instructed to call 911 for emergencies.    [name], MD    Subjective:    Psychiatric Chief Concern:  {Chief Complaint:210228460}    HPI: Patient is a 47 y.o., Black/African American race, Not Hispanic, Latino/a, or Spanish origin ethnicity,  ENGLISH speaking male  with a history of ***.***    Mood Symptoms:    Mood:{MOOD:23791:a} {RRPASTMOODSYMPTOMS:23792:a}  Sleep:{ABHSLEEP:47363:a}  interests:   hopelessness/helplessness:   activity level/energy: {RRENERGY:24027:a}  concentration: {RR CONCENTRATION:24029:a}  appetite: {RRAPPETITE:24044:a}  psychomotor: {RRPSYCHOMOTOR:24047:a}  motivation: {RRMOTIVATION:24051:a}  suicidality: {RRENDORSES/DENIES:24054:a}{RRSUICIDALITY:24055:a}  homicidality: {RRENDORSES/DENIES:24054:a}  irritability: DENIES    Anxiety Symptoms:  Baseline anxiety: {RRBASELINEANXIETY:24061:a}  Rumination, excessive worry: {RRRUMINATION/EXCESSIVE WORRY:24064:a}  Obsessions, compulsions: {RRobsessions/compulsions:24188}  Panic attacks: {RRPANICATTACKS:24190}  Nightmares, flashbacks, avoidance: {RRPTSDSYMPTOMS:24193}    Psychotic Symptoms:   {RRPSYCHOTICSYMPTOMS:24201}    Substance Abuse:   ETOH: {RRALCOHOLUSE:24195}  Illicit: {RRILLICTDRUGUSE:24199}  Licit: {RRLICITDRUGUSE:24200}    Cognitive Symptoms:   ADLs:  {RRADLS:24203}  IADLs: {rr:24205}  Memory/recall: {RRMEMORY/RECALL:24206}  Concentration/task completion: {RRCONCENTRATION/TASKCOMPLETION:24208}    Allergies:  {GSC Allergies:30421601}    Medications:   {Sur Gen Med list:9381256001}    Psychiatric/Medical History:  {GSC Past Medical History:30421616}    Surgical History:  {GSC Past Surgical History:30421619}    Social History:  Social History     Socioeconomic History    Marital status: Divorced    Number of children: 1   Tobacco Use    Smoking status: Every Day Current packs/day: 0.30     Average packs/day: 0.5 packs/day for 21.5 years (10.3 ttl pk-yrs)     Types: Cigarettes     Start date: 10/02/2001     Passive exposure: Past    Smokeless tobacco: Never    Tobacco comments:     Reduced use from 10cpd to 5cpd, though relights each one. Motivated to quit.    Vaping Use    Vaping status: Some Days    Substances: Nicotine    Passive vaping exposure: Yes   Substance and Sexual Activity    Alcohol use: No     Alcohol/week: 0.0 standard drinks of alcohol    Drug use: Yes     Types: Marijuana     Comment: Smokes 2-3 joints/day    Sexual activity: Not Currently     Partners: Female     Birth control/protection: Condom   Social History Narrative    Recently moved to  the [facility] area, lives with cousin     Social Determinants of Health     Financial Resource Strain: Low Risk  (01/28/2023)    Overall Financial Resource Strain (CARDIA)     Difficulty of Paying Living Expenses: Not hard at all   Food Insecurity: No Food Insecurity (01/28/2023)    Hunger Vital Sign     Worried About Running Out of Food in the Last Year: Never true     Ran Out of Food in the Last Year: Never true   Transportation Needs: No Transportation Needs (01/28/2023)    PRAPARE - Therapist, art (Medical): No     Lack of Transportation (Non-Medical): No   Physical Activity: Not on File (06/21/2021)    Received from [place], [place]    Physical Activity     Physical Activity: 0   Stress: Not on File (06/21/2021)    Received from [facility], [place]    Stress     Stress: 0   Social Connections: Not on File (06/21/2021)    Received from [place], [place]    Social Connections     Social Connections and Isolation: 0       Family History:  {GSC Family History:30421602}    ROS:   The balance of 10 systems reviewed is negative except as per HPI.     Objective:     Vitals:   There were no vitals filed for this visit.    Mental Status Exam:  Appearance:    {PSY Appearance:23008}   Motor:   {PSY Motor:23010} Speech/Language:    {PSY Speech/Lang:23011}   Mood:   {PSY Mood:23012}   Affect:   {PSY Affect:23013}   Thought process:   {PSY Thought Process:23015}   Thought content:     {PSY Thought Content:23016}   Perceptual disturbances:     {PSY Perceptual Disturbances:23017}     Orientation:   {PSY Orientation:23018}   Attention:   {PSY Attention:23019}   Concentration:   {PSY Concentration:23020}   Memory:   {PSY Memory:23021}    Fund of knowledge:    {PSY Fund of Knowledge:23022}   Insight:     {PSY Insight:23023}   Judgment:    {PSY Judgment:23024}   Impulse Control:   {PSY Impulse Control:23555}     PE:   Vital signs were reviewed.  The patient sat comfortably in chair.  The patient's breathing was observed to be comfortable and normal.  The patient is in no acute distress.  All extremity movements appear intact with normal strength and no abnormalities.  Gait is normal.  There are no focal neurological deficits observed.     Test Results:  Data Review: {PSY Labs:23633}  Imaging: {PSY Imaging findings:23611}    Psychometrics:         [name], MD

## 2023-03-26 NOTE — Unmapped (Signed)
[facility] Refill Coordination Note    Specialty Medication(s) to be Shipped:   Inflammatory Disorders: Humira    Other medication(s) to be shipped: No additional medications requested for fill at this time     Jerome Kennedy., DOB: Jun 28, 1976  Phone: [phone] (home)       All above HIPAA information was verified with patient.     Was a Nurse, learning disability used for this call? No    Completed refill call assessment today to schedule patient's medication shipment from the [facility] [phone]).  All relevant notes have been reviewed.     Specialty medication(s) and dose(s) confirmed: Regimen is correct and unchanged.   Changes to medications: Jago reports no changes at this time.  Changes to insurance: No  New side effects reported not previously addressed with a pharmacist or physician: None reported  Questions for the pharmacist: No    Confirmed patient received a Conservation officer, historic buildings and a Surveyor, mining with first shipment. The patient will receive a drug information handout for each medication shipped and additional FDA Medication Guides as required.       DISEASE/MEDICATION-SPECIFIC INFORMATION        For patients on injectable medications: Patient currently has 1 doses left.  Next injection is scheduled for 8/25.    SPECIALTY MEDICATION ADHERENCE     Medication Adherence    Patient reported X missed doses in the last month: 0  Specialty Medication: HUMIRA(CF) PEN 40 mg/0.4 mL  Patient is on additional specialty medications: No              Were doses missed due to medication being on hold? No    Humira 40/0.4 mg/ml: 1 doses of medicine on hand        REFERRAL TO PHARMACIST     Referral to the pharmacist: Not needed      [facility]     Shipping address confirmed in Epic.       Delivery Scheduled: Yes, Expected medication delivery date: 04/01/23.     Medication will be delivered via Same Day Courier to the prescription address in Epic WAM.    Jerome Kennedy   [facility] Specialty Technician

## 2023-03-30 LAB — CBC W/ AUTO DIFF
BASOPHILS ABSOLUTE COUNT: 0.1 10*9/L (ref 0.0–0.1)
BASOPHILS RELATIVE PERCENT: 0.7 %
EOSINOPHILS ABSOLUTE COUNT: 0.1 10*9/L (ref 0.0–0.5)
EOSINOPHILS RELATIVE PERCENT: 1.7 %
HEMATOCRIT: 41.4 % (ref 39.0–48.0)
HEMOGLOBIN: 13.5 g/dL (ref 12.9–16.5)
LYMPHOCYTES ABSOLUTE COUNT: 1.8 10*9/L (ref 1.1–3.6)
LYMPHOCYTES RELATIVE PERCENT: 22.3 %
MEAN CORPUSCULAR HEMOGLOBIN CONC: 32.7 g/dL (ref 32.0–36.0)
MEAN CORPUSCULAR HEMOGLOBIN: 27.6 pg (ref 25.9–32.4)
MEAN CORPUSCULAR VOLUME: 84.3 fL (ref 77.6–95.7)
MEAN PLATELET VOLUME: 7.9 fL (ref 6.8–10.7)
MONOCYTES ABSOLUTE COUNT: 0.6 10*9/L (ref 0.3–0.8)
MONOCYTES RELATIVE PERCENT: 7.5 %
NEUTROPHILS ABSOLUTE COUNT: 5.6 10*9/L (ref 1.8–7.8)
NEUTROPHILS RELATIVE PERCENT: 67.8 %
PLATELET COUNT: 252 10*9/L (ref 150–450)
RED BLOOD CELL COUNT: 4.91 10*12/L (ref 4.26–5.60)
RED CELL DISTRIBUTION WIDTH: 15 % (ref 12.2–15.2)
WBC ADJUSTED: 8.3 10*9/L (ref 3.6–11.2)

## 2023-03-30 LAB — LIPASE: LIPASE: 38 U/L (ref 12–53)

## 2023-03-30 LAB — COMPREHENSIVE METABOLIC PANEL
ALBUMIN: 3.5 g/dL (ref 3.4–5.0)
ALKALINE PHOSPHATASE: 159 U/L — ABNORMAL HIGH (ref 46–116)
ALT (SGPT): 11 U/L (ref 10–49)
ANION GAP: 7 mmol/L (ref 5–14)
AST (SGOT): 14 U/L (ref <=34)
BILIRUBIN TOTAL: 0.5 mg/dL (ref 0.3–1.2)
BLOOD UREA NITROGEN: 11 mg/dL (ref 9–23)
BUN / CREAT RATIO: 10
CALCIUM: 8.9 mg/dL (ref 8.7–10.4)
CHLORIDE: 111 mmol/L — ABNORMAL HIGH (ref 98–107)
CO2: 23 mmol/L (ref 20.0–31.0)
CREATININE: 1.05 mg/dL
EGFR CKD-EPI (2021) MALE: 88 mL/min/{1.73_m2} (ref >=60)
GLUCOSE RANDOM: 87 mg/dL (ref 70–179)
POTASSIUM: 3.6 mmol/L (ref 3.4–4.8)
PROTEIN TOTAL: 6.5 g/dL (ref 5.7–8.2)
SODIUM: 141 mmol/L (ref 135–145)

## 2023-03-31 ENCOUNTER — Emergency Department
Admit: 2023-03-31 | Discharge: 2023-03-31 | Disposition: A | Discharge status: Home / Self Care | Payer: PRIVATE HEALTH INSURANCE

## 2023-03-31 ENCOUNTER — Ambulatory Visit
Admit: 2023-03-31 | Discharge: 2023-03-31 | Disposition: A | Discharge status: Home / Self Care | Payer: PRIVATE HEALTH INSURANCE

## 2023-03-31 LAB — URINALYSIS WITH MICROSCOPY WITH CULTURE REFLEX PERFORMABLE
BACTERIA: NONE SEEN /HPF
BILIRUBIN UA: NEGATIVE
BLOOD UA: NEGATIVE
GLUCOSE UA: NEGATIVE
KETONES UA: NEGATIVE
LEUKOCYTE ESTERASE UA: NEGATIVE
NITRITE UA: NEGATIVE
PH UA: 6.5 (ref 5.0–9.0)
PROTEIN UA: 30 — AB
RBC UA: 5 /HPF — ABNORMAL HIGH (ref <=3)
SPECIFIC GRAVITY UA: 1.032 — ABNORMAL HIGH (ref 1.003–1.030)
SQUAMOUS EPITHELIAL: 3 /HPF (ref 0–5)
UROBILINOGEN UA: <2
WBC UA: 1 /HPF (ref <=2)

## 2023-03-31 LAB — LACTATE, VENOUS, WHOLE BLOOD: LACTATE BLOOD VENOUS: 1.3 mmol/L (ref 0.5–1.8)

## 2023-03-31 LAB — SEDIMENTATION RATE: ERYTHROCYTE SEDIMENTATION RATE: 23 mm/h — ABNORMAL HIGH (ref 0–15)

## 2023-03-31 LAB — C-REACTIVE PROTEIN: C-REACTIVE PROTEIN: 13 mg/L — ABNORMAL HIGH (ref <=10.0)

## 2023-03-31 MED ORDER — PREDNISONE 20 MG TABLET
ORAL_TABLET | Freq: Every day | ORAL | 0 refills | 4 days | Status: CP
Start: 2023-03-31 — End: 2023-04-04

## 2023-03-31 MED ADMIN — HYDROmorphone (PF) (DILAUDID) injection 1 mg: 1 mg | INTRAVENOUS | @ 06:00:00 | Stop: 2023-03-31

## 2023-03-31 MED ADMIN — HYDROmorphone (PF) (DILAUDID) injection 1 mg: 1 mg | INTRAVENOUS | @ 04:00:00 | Stop: 2023-03-31

## 2023-03-31 MED ADMIN — iohexol (OMNIPAQUE) 350 mg iodine/mL solution 100 mL: 100 mL | INTRAVENOUS | @ 06:00:00 | Stop: 2023-03-31

## 2023-03-31 MED ADMIN — diphenhydrAMINE (BENADRYL) injection 25 mg: 25 mg | INTRAVENOUS | @ 07:00:00 | Stop: 2023-03-31

## 2023-03-31 MED ADMIN — predniSONE (DELTASONE) tablet 60 mg: 60 mg | ORAL | @ 08:00:00 | Stop: 2023-03-31

## 2023-03-31 MED ADMIN — lactated ringers bolus 1,000 mL: 1000 mL | INTRAVENOUS | @ 04:00:00 | Stop: 2023-03-31

## 2023-03-31 MED ADMIN — metoclopramide (REGLAN) injection 10 mg: 10 mg | INTRAVENOUS | @ 04:00:00 | Stop: 2023-03-31

## 2023-03-31 NOTE — Unmapped (Signed)
Pt arrives ambulatory with a cane for C/C of ABD pain, nausea, and loose bowel's going on for the past hour. Hx of Chron's disease.

## 2023-03-31 NOTE — Unmapped (Signed)
[facility]  Emergency Department Provider Note     ED Clinical Impression     Final diagnoses:   Abdominal pain despite therapy for Crohn's disease (CMS-HCC) (Primary)      HPI, Medical Decision Making, ED Course     HPI: 47 y.o. male who has a past medical history of Anemia (04/05/2022), Anxiety, Avascular necrosis of femur head, right (CMS-HCC) (2011), Bipolar disorder (CMS-HCC) (2013), Cannabis use disorder, COPD (chronic obstructive pulmonary disease) (CMS-HCC) (2022), Crohn's disease (CMS-HCC) (1990), Depression (2007), DVT (deep venous thrombosis) (CMS-HCC) (02/08/2010), Myalgia, Rhinovirus infection, and SBO (small bowel obstruction) (CMS-HCC) (2011). who presents with acute onset of right lower quadrant abdominal pain, vomiting, and diarrhea for 4 hours.  He notes that he was in his normal state of health eating normally when he had an episode of nonbloody nonbilious vomit followed by the acute onset of right lower quadrant pain without radiation.  He has had 2 episodes of vomiting and 6 episodes of nonbloody, watery diarrhea (this is his baseline).  He says this is very similar to the pain he has when he has Crohn's flares.  Patient has a history of multiple abdominal surgeries.  Patient has not had his appendix out..  Patient denies any recent antibiotics.  Patient denies any urinary symptoms or testicular pain, swelling    DDx/MDM: Differential diagnosis includes appendicitis versus Crohn's disease flare versus small bowel obstruction versus C. difficile colitis.  Patient has not taken any recent antibiotics making C. difficile colitis less likely.  Total SBO less likely considering patient is passing watery stools.  Will evaluate with a CT scan.    Diagnostic workup as below. Will treat patient with dilaudid.    Orders Placed This Encounter   Procedures    CT Abdomen Pelvis W IV Contrast    CBC w/ Differential    Comprehensive Metabolic Panel    Lipase Level    Urinalysis with Microscopy with Culture Reflex    Lactate, Venous, Whole Blood    Sedimentation Rate    C-reactive protein    Insert peripheral IV       ED Course  ED Course as of 03/31/23 0425   Tue Mar 31, 2023   0200 Patient with elevated ESR and CRP consistent with Crohn's disease flare.  Normal white count making severe infection less likely.   0330 Patient with some itchiness likely response to hydromorphone as there is a known morphine pruritic response.  Patient reported that he typically does not have this reaction to Dilaudid.  Diphenhydramine given.   0410 Patient CT read significant for likely Crohn's exacerbation.  Patient is feeling better and has an appetite.  Will send home with steroid taper given he is stable and able to tolerate p.o.         Independent Interpretation of Studies: If applicable, documented in ED course above.  I have reviewed recent and relevant previous record, including: Inpatient notes - Patient seen in the past for similar sx and managed outpatient  Escalation of Care including OBS/Admission/Transfer was considered: However, patient was determined to be appropriate for outpatient management.    Prescription drugs considered but not prescribed: Antibiotics - Low likelihood of infectious source      Additional History Elements     Chief Complaint  Chief Complaint   Patient presents with    Abdominal Pain    Nausea    Diarrhea         Past Medical History:   Diagnosis Date  Anemia 04/05/2022    Anxiety     Avascular necrosis of femur head, right (CMS-HCC) 2011    Bipolar disorder (CMS-HCC) 2013    Follows the Psychiatry    Cannabis use disorder     COPD (chronic obstructive pulmonary disease) (CMS-HCC) 2022    Crohn's disease (CMS-HCC) 1990    Depression 2007    Severe depressive episode with psychotic symptoms    DVT (deep venous thrombosis) (CMS-HCC) 02/08/2010    DVT of superior vena cava, left brachiocephalic, 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    Myalgia     Rhinovirus infection     SBO (small bowel obstruction) (CMS-HCC) 2011 Past Surgical History:   Procedure Laterality Date    CHOLECYSTECTOMY      COLON SURGERY      partial resection    HERNIA REPAIR      PR CLOSE ENTEROSTOMY,RESEC+COLOREC ANAS Midline 11/01/2019    Procedure: CLO ENTEROSTOMY; W/RESECT COLORECTAL ANASTOM;  Surgeon: Claretta Fraise, MD;  Location: MAIN OR [place];  Service: Gastrointestinal    PR COLONOSCOPY FLX DX W/COLLJ SPEC WHEN PFRMD Left 01/07/2013    Procedure: COLONOSCOPY, FLEXIBLE, PROXIMAL TO SPLENIC FLEXURE; DIAGNOSTIC, W/WO COLLECTION SPECIMEN BY BRUSH OR WASH;  Surgeon: Tish Men, MD;  Location: GI [facility]URES [facility];  Service: Gastroenterology    PR COLONOSCOPY FLX DX W/COLLJ SPEC WHEN PFRMD  03/09/2015    Procedure: COLONOSCOPY, FLEXIBLE, PROXIMAL TO SPLENIC FLEXURE; DIAGNOSTIC, W/WO COLLECTION SPECIMEN BY BRUSH OR WASH;  Surgeon: Cletis Athens, MD;  Location: GI PROCEDURES [facility];  Service: Gastroenterology    PR COLONOSCOPY FLX DX W/COLLJ SPEC WHEN PFRMD N/A 10/28/2019    Procedure: COLONOSCOPY, FLEXIBLE, PROXIMAL TO SPLENIC FLEXURE; DIAGNOSTIC, W/WO COLLECTION SPECIMEN BY BRUSH OR WASH;  Surgeon: Beverly Milch, MD;  Location: GI PROCEDURES [facility];  Service: Gastroenterology    PR COLONOSCOPY FLX DX W/COLLJ SPEC WHEN PFRMD N/A 10/01/2022    Procedure: COLONOSCOPY, FLEXIBLE, PROXIMAL TO SPLENIC FLEXURE; DIAGNOSTIC, W/WO COLLECTION SPECIMEN BY BRUSH OR WASH;  Surgeon: Zetta Bills, MD;  Location: GI PROCEDURES [facility];  Service: Gastroenterology    PR EXPLORATORY OF ABDOMEN Midline 09/02/2019    Procedure: EXPLORATORY LAPAROTOMY, EXPLORATORY CELIOTOMY WITH OR WITHOUT BIOPSY(S);  Surgeon: Claretta Fraise, MD;  Location: MAIN OR [facility];  Service: Gastrointestinal    PR EXPLORATORY OF ABDOMEN Midline 11/01/2019    Procedure: EXPLORATORY LAPAROTOMY, EXPLORATORY CELIOTOMY WITH OR WITHOUT BIOPSY(S);  Surgeon: Claretta Fraise, MD;  Location: MAIN OR [facility];  Service: Gastrointestinal    PR FIX FINGER,VOLAR PLATE,I-P JT Left 08/07/2022    Procedure: REPAIR AND RECONSTRUCTION, FINGER, VOLAR PLATE, INTERPHALANGEAL JOINT;  Surgeon: [name], MD;  Location: MAIN OR [facility];  Service: Orthopedics    PR FREEING BOWEL ADHESION,ENTEROLYSIS N/A 11/01/2019    Procedure: Enterolysis (Separt Proc);  Surgeon: Claretta Fraise, MD;  Location: MAIN OR [facility];  Service: Gastrointestinal    PR ILEOSCOPY THRU STOMA,BIOPSY N/A 10/14/2019    Procedure: Karren Cobble; [place] Saa 1/MX;  Surgeon: Neysa Hotter, MD;  Location: GI [facility]URES [facility];  Service: Gastroenterology    PR PART REMOVAL COLON W COLOSTOMY Midline 09/02/2019    Procedure: COLECTOMY, PARTIAL; WITH SKIN LEVEL CECOSTOMY OR COLOSTOMY;  Surgeon: Claretta Fraise, MD;  Location: MAIN OR [facility];  Service: Gastrointestinal    PR REPAIR INCISIONAL HERNIA,REDUCIBLE Midline 09/02/2019    Procedure: REPAIR INIT INCISIONAL OR VENTRAL HERNIA; REDUCIBLE;  Surgeon: Kristopher Oppenheim, MD;  Location: MAIN OR ;  Service: Trauma    PR  REPAIR INTERCARP/CARP-METACARP JT Left 08/07/2022    Procedure: INTERPOSIT ARTHROPLASTY-INTERCARPAL/CARPOMETACAR;  Surgeon: [name], MD;  Location: MAIN OR [facility];  Service: Orthopedics    PR UPPER GI ENDOSCOPY,BIOPSY N/A 01/07/2013    Procedure: UGI ENDOSCOPY; WITH BIOPSY, SINGLE OR MULTIPLE;  Surgeon: Tish Men, MD;  Location: GI PROC[facility]URES [facility];  Service: Gastroenterology    PR UPPER GI ENDOSCOPY,BIOPSY N/A 10/14/2019    Procedure: UGI ENDOSCOPY; WITH BIOPSY, SINGLE OR MULTIPLE;  Surgeon: Neysa Hotter, MD;  Location: GI PROC[facility]URES [facility];  Service: Gastroenterology    PR UPPER GI ENDOSCOPY,DIAGNOSIS N/A 10/01/2022    Procedure: UGI ENDO, INCLUDE ESOPHAGUS, STOMACH, & DUODENUM &/OR JEJUNUM; DX W/WO COLLECTION SPECIMN, BY BRUSH OR WASH;  Surgeon: Zetta Bills, MD;  Location: GI PROC[facility]URES [facility];  Service: Gastroenterology       Allergies  Morphine and Toradol [ketorolac]    Family History  Family History   Problem Relation Age of Onset    Breast cancer Mother     Diabetes Maternal Aunt     Colon cancer Maternal Grandmother     Prostate cancer Maternal Grandfather     Cancer Paternal Grandmother     Crohn's disease Neg Hx        Social History  Social History     Tobacco Use    Smoking status: Every Day     Current packs/day: 0.30     Average packs/day: 0.5 packs/day for 21.5 years (10.3 ttl pk-yrs)     Types: Cigarettes     Start date: 10/02/2001     Passive exposure: Past    Smokeless tobacco: Never    Tobacco comments:     Reduced use from 10cpd to 5cpd, though relights each one. Motivated to quit.    Vaping Use    Vaping status: Some Days    Substances: Nicotine    Passive vaping exposure: Yes   Substance Use Topics    Alcohol use: No     Alcohol/week: 0.0 standard drinks of alcohol    Drug use: Yes     Types: Marijuana     Comment: Smokes 2-3 joints/day        Physical Exam     VITAL SIGNS:      Vitals:    03/30/23 2107 03/30/23 2109 03/30/23 2342 03/31/23 0217   BP:  120/88 102/78 111/82   Pulse: 99 86 83 87   Resp: 18 20 18 18    Temp:  36.4 ??C (97.5 ??F) 36.7 ??C (98.1 ??F) 36.7 ??C (98.1 ??F)   TempSrc:  Oral Oral Oral   SpO2: 100% 100% 100% 98%   Weight:  65.8 kg (145 lb)       Constitutional: Alert and oriented. No acute distress.  Eyes: Conjunctivae are normal.  HEENT: Normocephalic and atraumatic. Conjunctivae clear. No congestion. Moist mucous membranes.   Cardiovascular: Rate as above, regular rhythm. Normal and symmetric distal pulses. Brisk capillary refill. Normal skin turgor.  Respiratory: Normal respiratory effort. Breath sounds are normal. There are no wheezing or crackles heard.  Gastrointestinal: Soft, non-distended, non-tender.  Genitourinary: Deferred.  Musculoskeletal: Non-tender with normal range of motion in all extremities.  Neurologic: Normal speech and language. No gross focal neurologic deficits are appreciated. Patient is moving all extremities equally, face is symmetric at rest and with speech.  Skin: Skin is warm, dry and intact. No rash noted.  Psychiatric: Mood and affect are normal. Speech and behavior are normal.     Radiology  CT Abdomen Pelvis W IV Contrast   Preliminary Result       PRELIMINARY IMPRESSION WHILE IN DOWNTIME -- FULL REPORT TO FOLLOW IN THE    MORNING.      -Wall thickening and increased wall enhancement of the neoterminal ileum    suggesting active inflammation in this patient with Crohn disease.      -Liquid stool throughout the colon suggesting diarrheal illness.      -Similar findings of heterogeneous opacification of the iliac veins    beginning at the IVC/iliac bifurcation -- favored artifactual, but if    concerned for DVT consider lower extremity ultrasound.      -Additional findings as above.             Pertinent labs & imaging results that were available during my care of the patient were independently interpreted by me and considered in my medical decision making (see chart for details).    Portions of this record have been created using Scientist, clinical (histocompatibility and immunogenetics). Dictation errors have been sought, but may not have been identified and corrected.           Sharyon Cable, MD  Resident  03/31/23 [phone]

## 2023-04-01 MED FILL — HUMIRA PEN CITRATE FREE 40 MG/0.4 ML: SUBCUTANEOUS | 28 days supply | Qty: 4 | Fill #3

## 2023-04-24 MED ORDER — EMPTY CONTAINER
3 refills | 0 days
Start: 2023-04-24 — End: ?

## 2023-04-24 NOTE — Unmapped (Signed)
[facility] Refill Coordination Note    Specialty Medication(s) to be Shipped:   Inflammatory Disorders: Humira    Other medication(s) to be shipped:  sharps     Jerome Kennedy., DOB: 28-Jul-1976  Phone: [phone] (home)       All above HIPAA information was verified with patient.     Was a Nurse, learning disability used for this call? No    Completed refill call assessment today to schedule patient's medication shipment from the [facility] [phone]).  All relevant notes have been reviewed.     Specialty medication(s) and dose(s) confirmed: Regimen is correct and unchanged.   Changes to medications: Jerome Kennedy reports no changes at this time.  Changes to insurance: No  New side effects reported not previously addressed with a pharmacist or physician: None reported  Questions for the pharmacist: No    Confirmed patient received a Conservation officer, historic buildings and a Surveyor, mining with first shipment. The patient will receive a drug information handout for each medication shipped and additional FDA Medication Guides as required.       DISEASE/MEDICATION-SPECIFIC INFORMATION        For patients on injectable medications: Patient currently has 1 doses left.  Next injection is scheduled for 9/22.    SPECIALTY MEDICATION ADHERENCE     Medication Adherence    Patient reported X missed doses in the last month: 0  Specialty Medication: HUMIRA(CF) PEN 40 mg/0.4 mL  Patient is on additional specialty medications: No              Were doses missed due to medication being on hold? No    Humira 40/0.4 mg/ml: 2 doses of medicine on hand        REFERRAL TO PHARMACIST     Referral to the pharmacist: Not needed      [facility]     Shipping address confirmed in Epic.       Delivery Scheduled: Yes, Expected medication delivery date: 04/29/23.     Medication will be delivered via Same Day Courier to the prescription address in Epic WAM.    Jerome Kennedy Shared [facility] Specialty Technician

## 2023-04-25 ENCOUNTER — Ambulatory Visit: Admit: 2023-04-25 | Discharge: 2023-04-27 | Payer: PRIVATE HEALTH INSURANCE

## 2023-04-25 LAB — COMPREHENSIVE METABOLIC PANEL
ALBUMIN: 3 g/dL — ABNORMAL LOW (ref 3.4–5.0)
ALKALINE PHOSPHATASE: 124 U/L — ABNORMAL HIGH (ref 46–116)
ALT (SGPT): <7 U/L — ABNORMAL LOW (ref 10–49)
ANION GAP: 5 mmol/L (ref 5–14)
BILIRUBIN TOTAL: 0.9 mg/dL (ref 0.3–1.2)
BLOOD UREA NITROGEN: 11 mg/dL (ref 9–23)
BUN / CREAT RATIO: 11
CALCIUM: 8.4 mg/dL — ABNORMAL LOW (ref 8.7–10.4)
CHLORIDE: 110 mmol/L — ABNORMAL HIGH (ref 98–107)
CO2: 30 mmol/L (ref 20.0–31.0)
CREATININE: 0.97 mg/dL
EGFR CKD-EPI (2021) MALE: >90 mL/min/{1.73_m2} (ref >=60)
GLUCOSE RANDOM: 87 mg/dL (ref 70–179)
PROTEIN TOTAL: 5.9 g/dL (ref 5.7–8.2)
SODIUM: 145 mmol/L (ref 135–145)

## 2023-04-25 LAB — CBC W/ AUTO DIFF
BASOPHILS ABSOLUTE COUNT: 0 10*9/L (ref 0.0–0.1)
BASOPHILS RELATIVE PERCENT: 0.6 %
EOSINOPHILS ABSOLUTE COUNT: 0.1 10*9/L (ref 0.0–0.5)
EOSINOPHILS RELATIVE PERCENT: 1.3 %
HEMATOCRIT: 39.1 % (ref 39.0–48.0)
HEMOGLOBIN: 12.8 g/dL — ABNORMAL LOW (ref 12.9–16.5)
LYMPHOCYTES ABSOLUTE COUNT: 1.6 10*9/L (ref 1.1–3.6)
LYMPHOCYTES RELATIVE PERCENT: 32.8 %
MEAN CORPUSCULAR HEMOGLOBIN CONC: 32.7 g/dL (ref 32.0–36.0)
MEAN CORPUSCULAR HEMOGLOBIN: 27.8 pg (ref 25.9–32.4)
MEAN CORPUSCULAR VOLUME: 85.1 fL (ref 77.6–95.7)
MEAN PLATELET VOLUME: 8.5 fL (ref 6.8–10.7)
MONOCYTES ABSOLUTE COUNT: 0.5 10*9/L (ref 0.3–0.8)
MONOCYTES RELATIVE PERCENT: 10 %
NEUTROPHILS ABSOLUTE COUNT: 2.6 10*9/L (ref 1.8–7.8)
NEUTROPHILS RELATIVE PERCENT: 55.3 %
PLATELET COUNT: 221 10*9/L (ref 150–450)
RED BLOOD CELL COUNT: 4.59 10*12/L (ref 4.26–5.60)
RED CELL DISTRIBUTION WIDTH: 14.7 % (ref 12.2–15.2)
WBC ADJUSTED: 4.8 10*9/L (ref 3.6–11.2)

## 2023-04-25 LAB — LACTATE SEPSIS, VENOUS: LACTATE BLOOD VENOUS: 0.8 mmol/L (ref 0.5–1.8)

## 2023-04-25 LAB — URINALYSIS WITH MICROSCOPY WITH CULTURE REFLEX PERFORMABLE
BACTERIA: NONE SEEN /HPF
BILIRUBIN UA: NEGATIVE
BLOOD UA: NEGATIVE
GLUCOSE UA: NEGATIVE
KETONES UA: NEGATIVE
LEUKOCYTE ESTERASE UA: NEGATIVE
NITRITE UA: NEGATIVE
PH UA: 6.5 (ref 5.0–9.0)
PROTEIN UA: 30 — AB
RBC UA: 5 /HPF — ABNORMAL HIGH (ref <=3)
SPECIFIC GRAVITY UA: 1.043 — ABNORMAL HIGH (ref 1.003–1.030)
SQUAMOUS EPITHELIAL: 1 /HPF (ref 0–5)
UROBILINOGEN UA: >12 — AB
WBC UA: 3 /HPF — ABNORMAL HIGH (ref <=2)

## 2023-04-25 LAB — LIPASE: LIPASE: 21 U/L (ref 12–53)

## 2023-04-25 LAB — SEDIMENTATION RATE: ERYTHROCYTE SEDIMENTATION RATE: 14 mm/h (ref 0–15)

## 2023-04-25 LAB — C-REACTIVE PROTEIN: C-REACTIVE PROTEIN: 12 mg/L — ABNORMAL HIGH (ref <=10.0)

## 2023-04-25 MED ADMIN — promethazine (PHENERGAN) 12.5 mg in sodium chloride (NS) 0.9 % 50 mL IVPB: 12.5 mg | INTRAVENOUS | Stop: 2023-04-25

## 2023-04-25 MED ADMIN — HYDROmorphone (PF) (DILAUDID) injection 0.5 mg: 0.5 mg | INTRAVENOUS | @ 23:00:00 | Stop: 2023-04-25

## 2023-04-25 MED ADMIN — ondansetron (ZOFRAN) injection 4 mg: 4 mg | INTRAVENOUS | @ 22:00:00 | Stop: 2023-04-25

## 2023-04-25 MED ADMIN — HYDROmorphone (PF) (DILAUDID) injection 1 mg: 1 mg | INTRAVENOUS | @ 22:00:00 | Stop: 2023-04-25

## 2023-04-25 NOTE — Unmapped (Signed)
BIB [facility] r/t severe abd pain and diarrhea iso Crohns.

## 2023-04-26 DIAGNOSIS — K50018 Crohn's disease of small intestine with other complication: Principal | ICD-10-CM

## 2023-04-26 LAB — PROTEIN / CREATININE RATIO, URINE
CREATININE, URINE: 123.8 mg/dL
PROTEIN URINE: 15.5 mg/dL
PROTEIN/CREAT RATIO, URINE: 0.125

## 2023-04-26 LAB — MAGNESIUM: MAGNESIUM: 1.7 mg/dL (ref 1.6–2.6)

## 2023-04-26 LAB — IRON PANEL
IRON SATURATION: 9 % — ABNORMAL LOW (ref 20–55)
IRON: 23 ug/dL — ABNORMAL LOW
TOTAL IRON BINDING CAPACITY: 269 ug/dL (ref 250–425)

## 2023-04-26 LAB — BASIC METABOLIC PANEL
ANION GAP: 5 mmol/L (ref 5–14)
BLOOD UREA NITROGEN: 10 mg/dL (ref 9–23)
BUN / CREAT RATIO: 11
CALCIUM: 8 mg/dL — ABNORMAL LOW (ref 8.7–10.4)
CHLORIDE: 112 mmol/L — ABNORMAL HIGH (ref 98–107)
CO2: 27 mmol/L (ref 20.0–31.0)
CREATININE: 0.9 mg/dL
EGFR CKD-EPI (2021) MALE: >90 mL/min/{1.73_m2} (ref >=60)
GLUCOSE RANDOM: 91 mg/dL (ref 70–179)
POTASSIUM: 3.8 mmol/L (ref 3.5–5.1)
SODIUM: 144 mmol/L (ref 135–145)

## 2023-04-26 LAB — HEPATIC FUNCTION PANEL
ALBUMIN: 2.7 g/dL — ABNORMAL LOW (ref 3.4–5.0)
ALKALINE PHOSPHATASE: 123 U/L — ABNORMAL HIGH (ref 46–116)
ALT (SGPT): <7 U/L — ABNORMAL LOW (ref 10–49)
AST (SGOT): 12 U/L (ref <=34)
BILIRUBIN DIRECT: 0.2 mg/dL (ref 0.00–0.30)
BILIRUBIN TOTAL: 0.5 mg/dL (ref 0.3–1.2)
PROTEIN TOTAL: 5.1 g/dL — ABNORMAL LOW (ref 5.7–8.2)

## 2023-04-26 LAB — C-REACTIVE PROTEIN: C-REACTIVE PROTEIN: 29 mg/L — ABNORMAL HIGH (ref <=10.0)

## 2023-04-26 LAB — FERRITIN: FERRITIN: 27.8 ng/mL

## 2023-04-26 MED ADMIN — iohexol (OMNIPAQUE) 350 mg iodine/mL solution 100 mL: 100 mL | INTRAVENOUS | @ 03:00:00 | Stop: 2023-04-25

## 2023-04-26 MED ADMIN — HYDROmorphone (PF) (DILAUDID) injection 0.5 mg: 0.5 mg | INTRAVENOUS | @ 03:00:00 | Stop: 2023-04-25

## 2023-04-26 MED ADMIN — oxyCODONE (ROXICODONE) immediate release tablet 5 mg: 5 mg | ORAL | @ 08:00:00 | Stop: 2023-04-26

## 2023-04-26 MED ADMIN — diphenhydrAMINE (BENADRYL) capsule/tablet 25 mg: 25 mg | ORAL | @ 05:00:00 | Stop: 2023-04-26

## 2023-04-26 MED ADMIN — oxyCODONE (ROXICODONE) immediate release tablet 5 mg: 5 mg | ORAL | @ 13:00:00 | Stop: 2023-04-26

## 2023-04-26 MED ADMIN — ascorbic acid (vitamin C) (VITAMIN C) tablet 250 mg: 250 mg | ORAL | @ 12:00:00

## 2023-04-26 MED ADMIN — HYDROmorphone (PF) injection Syrg 0.5 mg: .5 mg | INTRAVENOUS | @ 23:00:00 | Stop: 2023-04-29

## 2023-04-26 MED ADMIN — cholecalciferol (vitamin D3 25 mcg (1,000 units)) tablet 25 mcg: 25 ug | ORAL | @ 12:00:00

## 2023-04-26 MED ADMIN — iohexol (OMNIPAQUE) 240 mg iodine/mL oral solution 50 mL: 50 mL | ORAL | @ 01:00:00 | Stop: 2023-04-25

## 2023-04-26 MED ADMIN — HYDROmorphone (PF) injection Syrg 0.5 mg: .5 mg | INTRAVENOUS | @ 09:00:00 | Stop: 2023-04-29

## 2023-04-26 MED ADMIN — pantoprazole (Protonix) EC tablet 40 mg: 40 mg | ORAL | @ 12:00:00

## 2023-04-26 MED ADMIN — ferrous sulfate tablet 325 mg: 325 mg | ORAL | @ 12:00:00

## 2023-04-26 MED ADMIN — ondansetron (ZOFRAN-ODT) disintegrating tablet 8 mg: 8 mg | ORAL | @ 08:00:00

## 2023-04-26 MED ADMIN — prochlorperazine (COMPAZINE) injection 5 mg: 5 mg | INTRAVENOUS | @ 19:00:00

## 2023-04-26 MED ADMIN — prochlorperazine (COMPAZINE) injection 5 mg: 5 mg | INTRAVENOUS | @ 10:00:00

## 2023-04-26 MED ADMIN — piperacillin-tazobactam (ZOSYN) 3.375 g in sodium chloride 0.9 % (NS) 100 mL IVPB-MBP: 3.375 g | INTRAVENOUS | @ 06:00:00 | Stop: 2023-04-26

## 2023-04-26 MED ADMIN — HYDROmorphone (PF) injection Syrg 0.5 mg: .5 mg | INTRAVENOUS | @ 14:00:00 | Stop: 2023-04-29

## 2023-04-26 MED ADMIN — lactated ringers bolus 1,000 mL: 1000 mL | INTRAVENOUS | @ 08:00:00 | Stop: 2023-04-26

## 2023-04-26 MED ADMIN — HYDROmorphone (PF) injection Syrg 0.5 mg: .5 mg | INTRAVENOUS | @ 19:00:00 | Stop: 2023-04-29

## 2023-04-26 NOTE — Unmapped (Signed)
Internal Medicine (MEDL) History & Physical    Assessment & Plan:   Jerome Kennedy. is a 47 y.o. male whose presentation is complicated by Crohn's disease of small and large intestine dx 1990 s/p ileocecectomy 2003 c/b SBO due to strictures s/p right-sided hemicolectomy and end-ileostomy 2021 with reversal, ventral hernia, recurrent C diff infections, Bipolar disorder, Iron Deficiency anemia, DVT in 2011 off AC, that presented to [facility] with RLQ abdominal pain and neoterminal ileum inflammation c/f Crohns Disease flare.     Principal Problem:    Abdominal pain  Active Problems:    Crohn's disease (CMS-HCC)    Exacerbation of Crohn's disease (CMS-HCC)    Iron deficiency anemia    Bipolar 1 disorder (CMS-HCC)    Hx of deep venous thrombosis        Active Problems    RLQ abdominal pain - moderate-severe Crohn's disease with exacerbation - Hx ileocecectomy 2003 w/ SBO's - Hx C. Difficile colitis  3-day history of progressive severe sharp 9/10 right lower quadrant abdominal pain associated with bilious nausea/vomiting, 4 episodes of painful liquid brown stools mixed with blood.  Nondistended abdomen w/ large reducible ventral abdominal hernia.  Abdomen tender right lower quadrant with guarding.  History of recurrent admissions and ED visits for abdominal pain.  Follows with [facility] for Crohn's disease.  Last seen 6 months ago by Dr. Chauncy Passy and was continued on weekly Humira injections.  Reported to have failed Remicade in the past years ago, but endoscopy while on Humira showed no active disease February 2024.  Reports excellent compliance with Humira. CT evidence of neoterminal ileum inflammation w/ elevated CRP and hematochezia, most concerning for Crohn's disease flare. If this is due to Crohns disease, would represent treatment failure on Humira. No evidence of complete obstruction on CT but did show dilated loops of small bowel.  Considered possibility of stricture in setting of multiple abdominal surgeries and dehydration contributing to presentation as his first symptom was nausea/vomiting w/ poor PO intake that could have lead to him becoming dehydrated. Also considering C Difficile infection w/ prior hx and fever at home.  -- GI luminal consult, appreciate recommendations  -- Clear liquid diet, advance as tolerated   -- Discuss w/ GI in morning role of inpatient endoscopy and immunosuppressives. Suspect will need glucocorticoids and change in biologic therapy but will discuss w/ GI before making changes  -- Pain: prn PO Oxy 5mg  q4h, IV dilaudid if refractory and unable to take PO. Will wean as able  -- Ordered 1L LR, reassess in the morning for ongoing needs  -- Ordered C difficile assay, GIPP  -- S/p Zosyn in the ED, discontinue Abx  -- fecal calprotectin  -- Daily CBC w/ Diff, CMP  -- Hold Humira for now, discuss with GI (next injection due 9/22)    Nausea/vomiting  4-5 episodes bilious vomiting. Reported to have mentioned blood in the ED, but denies on my questioning. Moderately dilated small bowel in setting of RLQ pain above w/o complete obstruction.  -- PO and IV zofran as needed    Hypoalbuminemia  Likely in setting of poor nutrition. No hx liver disease but does have moderate proteinuria on UA.  -- urine protein/creatinine ratio    Active Tobacco use  Smokes 5-10 cigarettes daily.  -- nicotine replacement therapy    Chronic Problems    Bipolar 1 disorder - Hx Homelessness  Currently stable taking medication nightly.  -- Continue quetiapine 100 mg nightly    GERD  --  Continue Protonix 40mg  daily    Iron Deficiency Anemia  -- ordered iron panel  -- continue PO iron    History of RUE DVT 2011  AC was discontinued by hematology in setting of recurrent GI bleeds.  -- DVT ppx Lovenox only    The patient's presentation is complicated by the following clinically significant conditions requiring additional evaluation and treatment: - Hypercoagulable state requiring additional attention to DVT prophylaxis and treatment  - Dehydration POA requiring further investigation, treatment, or monitoring         Issues Impacting Complexity of Management:  -Parenteral controlled medications: IV Dilaudid      Checklist:  Diet: Clear Liquid Diet and Regular Diet  DVT PPx: Lovenox 40mg  q24h  Code Status: Full Code  Dispo: Patient appropriate for Observation based on expectation at time of admission that period of observation will be less than 30 hours    Team Contact Information:   Primary Team: Internal Medicine (MEDL)  Primary Resident: Junius Roads, MD  Resident's Pager: [phone] (Gen MedL Intern - Tower)    Chief Concern:   Abdominal pain      Subjective:   Jerome Kennedy. is a 47 y.o. male with pertinent PMHx of Crohn's disease of small and large intestine dx 1990 s/p ileocecectomy 2003 c/b SBO due to strictures s/p right-sided hemicolectomy and end-ileostomy 2021 with reversal, ventral hernia, recurrent C diff infections, Bipolar disorder, Iron Deficiency anemia, DVT in 2011 off AC, that presented to [facility] with RLQ abdominal pain, fever, nausea/vomiting, hematochezia.          HPI:  3-day history of right-sided lower abdominal pain radiating into his lower back.  Pain is described as sharp stabbing, severe in nature 9/10.  Associated w/ 5 episodes of loose brown painful stools mixed with blood.  Prior to symptoms of abdominal pain, started feeling nauseous 3 to 4 days ago with associated vomiting.  Denies coffee-ground emesis or blood in vomitus.  He had hoped that the pain would go away with rest, but it was persistent and worsened in severity.  With severe pain and measuring his temperature at home with a digital thermometer of 101.1, he decided to come to the ED for evaluation and treatment for what he thinks is a Crohn's disease flare.  With his symptoms, he has not been able to tolerate p.o. for the past 2 days and feels dehydrated with dry mouth and feeling of thirst.  He follows with [facility] gastroenterology for Crohn's disease and has been taking Humira injections weekly for the past 6 months.  He does not miss doses and last dose reported as 9/15. Denies symptoms of fevers, chills, headache, chest pain, SOB, cough, congestion, dysuria.    Extensive history of prior abdominal surgeries including hemicolectomy, ileocecectomy w/ ileostomy in 2021 that was reversed, repair of ventral hernia.    In the ED vital signs stable afebrile normotensive satting well on room air.  Abdominal exam with right lower quadrant pain without rebound tenderness and was positive bowel sounds concerning for SBO.  Labs with ESR 14 normal, CRP 12 elevated.  Normal lactate.  CT abdomen/pelvis with bowel wall thickening and wall enhancement of the neoterminal ileum suggesting active inflammation compatible with Crohn's disease.  He was treated with Dilaudid for pain and Phenergan for nausea in the ED.  One-time dose Zosyn for possible intra-abdominal infection.      Pertinent Surgical Hx  Past Surgical History:   Procedure Laterality Date    CHOLECYSTECTOMY  COLON SURGERY      partial resection    HERNIA REPAIR      PR CLOSE ENTEROSTOMY,RESEC+COLOREC ANAS Midline 11/01/2019    Procedure: CLO ENTEROSTOMY; W/RESECT COLORECTAL ANASTOM;  Surgeon: Claretta Fraise, MD;  Location: MAIN OR [place];  Service: Gastrointestinal    PR COLONOSCOPY FLX DX W/COLLJ SPEC WHEN PFRMD Left 01/07/2013    Procedure: COLONOSCOPY, FLEXIBLE, PROXIMAL TO SPLENIC FLEXURE; DIAGNOSTIC, W/WO COLLECTION SPECIMEN BY BRUSH OR WASH;  Surgeon: Tish Men, MD;  Location: GI PROCEDURES [facility]IAL [facility];  Service: Gastroenterology    PR COLONOSCOPY FLX DX W/COLLJ SPEC WHEN PFRMD  03/09/2015    Procedure: COLONOSCOPY, FLEXIBLE, PROXIMAL TO SPLENIC FLEXURE; DIAGNOSTIC, W/WO COLLECTION SPECIMEN BY BRUSH OR WASH;  Surgeon: Cletis Athens, MD;  Location: GI PROCEDURES [facility];  Service: Gastroenterology    PR COLONOSCOPY FLX DX W/COLLJ SPEC WHEN PFRMD N/A 10/28/2019    Procedure: COLONOSCOPY, FLEXIBLE, PROXIMAL TO SPLENIC FLEXURE; DIAGNOSTIC, W/WO COLLECTION SPECIMEN BY BRUSH OR WASH;  Surgeon: Beverly Milch, MD;  Location: GI PROCEDURES MEMORIAL [facility];  Service: Gastroenterology    PR COLONOSCOPY FLX DX W/COLLJ SPEC WHEN PFRMD N/A 10/01/2022    Procedure: COLONOSCOPY, FLEXIBLE, PROXIMAL TO SPLENIC FLEXURE; DIAGNOSTIC, W/WO COLLECTION SPECIMEN BY BRUSH OR WASH;  Surgeon: Zetta Bills, MD;  Location: GI PROCEDURES [facility];  Service: Gastroenterology    PR EXPLORATORY OF ABDOMEN Midline 09/02/2019    Procedure: EXPLORATORY LAPAROTOMY, EXPLORATORY CELIOTOMY WITH OR WITHOUT BIOPSY(S);  Surgeon: Claretta Fraise, MD;  Location: MAIN OR [facility];  Service: Gastrointestinal    PR EXPLORATORY OF ABDOMEN Midline 11/01/2019    Procedure: EXPLORATORY LAPAROTOMY, EXPLORATORY CELIOTOMY WITH OR WITHOUT BIOPSY(S);  Surgeon: Claretta Fraise, MD;  Location: MAIN OR [facility];  Service: Gastrointestinal    PR FIX FINGER,VOLAR PLATE,I-P JT Left 08/07/2022    Procedure: REPAIR AND RECONSTRUCTION, FINGER, VOLAR PLATE, INTERPHALANGEAL JOINT;  Surgeon: [name], MD;  Location: MAIN OR [facility];  Service: Orthopedics    PR FREEING BOWEL ADHESION,ENTEROLYSIS N/A 11/01/2019    Procedure: Enterolysis (Separt Proc);  Surgeon: Claretta Fraise, MD;  Location: MAIN OR [facility];  Service: Gastrointestinal    PR ILEOSCOPY THRU STOMA,BIOPSY N/A 10/14/2019    Procedure: Karren Cobble; [place] Saa 1/MX;  Surgeon: Neysa Hotter, MD;  Location: GI PROCEDURES [facility];  Service: Gastroenterology    PR PART REMOVAL COLON W COLOSTOMY Midline 09/02/2019    Procedure: COLECTOMY, PARTIAL; WITH SKIN LEVEL CECOSTOMY OR COLOSTOMY;  Surgeon: Claretta Fraise, MD;  Location: MAIN OR [facility];  Service: Gastrointestinal    PR REPAIR INCISIONAL HERNIA,REDUCIBLE Midline 09/02/2019    Procedure: REPAIR INIT INCISIONAL OR VENTRAL HERNIA; REDUCIBLE;  Surgeon: Kristopher Oppenheim, MD;  Location: MAIN OR [place];  Service: Trauma    PR REPAIR INTERCARP/CARP-METACARP JT Left 08/07/2022    Procedure: Jorene Guest;  Surgeon: [name], MD;  Location: MAIN OR [facility];  Service: Orthopedics    PR UPPER GI ENDOSCOPY,BIOPSY N/A 01/07/2013    Procedure: UGI ENDOSCOPY; WITH BIOPSY, SINGLE OR MULTIPLE;  Surgeon: Tish Men, MD;  Location: GI PROCEDURES [facility]IAL [facility];  Service: Gastroenterology    PR UPPER GI ENDOSCOPY,BIOPSY N/A 10/14/2019    Procedure: UGI ENDOSCOPY; WITH BIOPSY, SINGLE OR MULTIPLE;  Surgeon: Neysa Hotter, MD;  Location: GI PROCEDURES MEMORIAL [facility];  Service: Gastroenterology    PR UPPER GI ENDOSCOPY,DIAGNOSIS N/A 10/01/2022    Procedure: UGI ENDO, INCLUDE ESOPHAGUS, STOMACH, & DUODENUM &/OR JEJUNUM; DX W/WO COLLECTION SPECIMN, BY BRUSH OR WASH;  Surgeon: Raphael Gibney,  Marybelle Killings, MD;  Location: GI PROCEDURES [facility];  Service: Gastroenterology        Pertinent Family Hx  Family History   Problem Relation Age of Onset    Breast cancer Mother     Diabetes Maternal Aunt     Colon cancer Maternal Grandmother     Prostate cancer Maternal Grandfather     Cancer Paternal Grandmother     Crohn's disease Neg Hx         Pertinent Social Hx   Social History     Socioeconomic History    Marital status: Divorced    Number of children: 1   Tobacco Use    Smoking status: Every Day     Current packs/day: 0.30     Average packs/day: 0.5 packs/day for 21.6 years (10.3 ttl pk-yrs)     Types: Cigarettes     Start date: 10/02/2001     Passive exposure: Past    Smokeless tobacco: Never    Tobacco comments:     Reduced use from 10cpd to 5cpd, though relights each one. Motivated to quit.    Vaping Use    Vaping status: Some Days    Substances: Nicotine    Passive vaping exposure: Yes   Substance and Sexual Activity    Alcohol use: No     Alcohol/week: 0.0 standard drinks of alcohol    Drug use: Yes     Types: Marijuana     Comment: Smokes 2-3 joints/day    Sexual activity: Not Currently     Partners: Female     Birth control/protection: Condom   Social History Narrative    Recently moved to the [place] area, lives with cousin     Social Determinants of Health     Financial Resource Strain: Low Risk  (01/28/2023)    Overall Financial Resource Strain (CARDIA)     Difficulty of Paying Living Expenses: Not hard at all   Transportation Needs: No Transportation Needs (01/28/2023)    PRAPARE - Therapist, art (Medical): No     Lack of Transportation (Non-Medical): No   Physical Activity: Not on File (06/21/2021)    Received from [place], [place]    Physical Activity     Physical Activity: 0   Stress: Not on File (06/21/2021)    Received from [facility], [place]    Stress     Stress: 0   Social Connections: Not on File (04/18/2023)    Received [facility]    Social Connections     Connectedness: 0        Allergies  Morphine and Toradol [ketorolac]    I was NOT able to review the Medication List with the patient or a representative. Further medication reconciliation is needed.  Prior to Admission medications    Medication Dose, Route, Frequency   albuterol HFA 90 mcg/actuation inhaler 2 puffs, Inhalation, Every 6 hours PRN   ascorbic acid, vitamin C, (VITAMIN C) 250 MG tablet 250 mg, Oral, Every other day, Take with oral iron   calcium carbonate (TUMS) 200 mg calcium (500 mg) chewable tablet 2 tablets, Oral, 2 times a day (standard)   cholecalciferol, vitamin D3 25 mcg, 1,000 units,, 1,000 unit (25 mcg) tablet 25 mcg, Oral, Daily (standard)   cyanocobalamin, vitamin B-12, 1,000 mcg Subl 2,000 mcg, Sublingual, Daily   empty container Misc Use as directed   ferrous sulfate 325 (65 FE) MG tablet 325 mg, Oral, Every other day  fluticasone propionate (FLONASE) 50 mcg/actuation nasal spray 2 sprays, Each Nare, Daily PRN   HUMIRA PEN CITRATE FREE 40 MG/0.4 ML Inject the contents of 1 pen (40 mg total) under the skin every seven (7) days.   magnesium oxide (MAG-OX) 400 mg (241.3 mg elemental magnesium) tablet 400 mg, Oral, 2 times a day (standard)   nicotine (NICODERM CQ) 21 mg/24 hr patch 1 patch, Transdermal, Daily (standard)   OPTICHAMBER DIAMOND VHC Spcr    oxyCODONE (ROXICODONE) 5 MG immediate release tablet 5 mg, Oral, Daily PRN   pantoprazole (PROTONIX) 40 MG tablet 40 mg, Oral, Daily (standard)   QUEtiapine (SEROQUEL) 100 MG tablet Take 1 tablet (100 mg total) by mouth nightly.       Designated Environmental health practitioner:  Mr. Braccia currently has decisional capacity for healthcare decision-making and is able to designate a surrogate healthcare decision maker. Mr. Bridger designated healthcare decision maker(s) is/are Thi Soldner patient's adult sibling) as denoted by stated patient preference.    Objective:   Physical Exam:  Temp:  [36.2 ??C (97.1 ??F)-36.5 ??C (97.7 ??F)] 36.5 ??C (97.7 ??F)  Heart Rate:  [64-68] 64  SpO2 Pulse:  [65] 65  Resp:  [14-18] 14  BP: (93-116)/(55-83) 108/64  SpO2:  [97 %-100 %] 100 %    Gen: NAD, converses uncomfortable appearing in bed  Eyes: Sclera anicteric, EOMI grossly normal   HENT: Atraumatic, normocephalic. No oral ulcerations noted  Neck: Trachea midline  Heart: RRR, no murmurs rubs or gallops  Lungs: CTAB, no crackles or wheezes  Abdomen: Large ventral abdominal hernia present reducible without erythema or signs of ischemia. RLQ abdominal pain with guarding. No rebound tenderness. R sided CVA tenderness  Extremities: No edema  Neuro: Grossly symmetric, non-focal    Skin:  No rashes, lesions on clothed exam  Psych: Alert, oriented

## 2023-04-26 NOTE — Unmapped (Signed)
VSS.    Pt in for C diffe R/O . No specimen collected at this . No falls noted. Pt educated to call for assistance if needed... Pain  controlled by prn pain meds.. Will continue to monitor.    Problem: Adult Inpatient Plan of Care  Goal: Plan of Care Review  Outcome: Progressing  Goal: Patient-Specific Goal (Individualized)  Outcome: Progressing  Goal: Absence of Hospital-Acquired Illness or Injury  Outcome: Progressing  Intervention: Identify and Manage Fall Risk  Recent Flowsheet Documentation  Taken 04/26/2023 0530 by Darene Lamer, RN  Safety Interventions:   fall reduction program maintained   low bed   nonskid shoes/slippers when out of bed  Intervention: Prevent Skin Injury  Recent Flowsheet Documentation  Taken 04/26/2023 0530 by Darene Lamer, RN  Positioning for Skin: Supine/Back  Device Skin Pressure Protection: adhesive use limited  Skin Protection: adhesive use limited  Goal: Optimal Comfort and Wellbeing  Outcome: Progressing  Goal: Readiness for Transition of Care  Outcome: Progressing  Goal: Rounds/Family Conference  Outcome: Progressing     Problem: Fall Injury Risk  Goal: Absence of Fall and Fall-Related Injury  Outcome: Progressing  Intervention: Promote Scientist, clinical (histocompatibility and immunogenetics) Documentation  Taken 04/26/2023 0530 by Darene Lamer, RN  Safety Interventions:   fall reduction program maintained   low bed   nonskid shoes/slippers when out of bed     Problem: Self-Care Deficit  Goal: Improved Ability to Complete Activities of Daily Living  Outcome: Progressing     Problem: Pain Acute  Goal: Optimal Pain Control and Function  Outcome: Progressing     Problem: Infection  Goal: Absence of Infection Signs and Symptoms  Outcome: Progressing

## 2023-04-26 NOTE — Unmapped (Addendum)
 [facility]  Emergency Department Provider Note     ED Clinical Impression     Final diagnoses:   RLQ abdominal pain (Primary)   Bloody diarrhea      HPI, Medical Decision Making, ED Course     HPI: 47 y.o. male who has a past medical history of Anemia (04/05/2022), Anxiety, Avascular necrosis of femur head, right (CMS-HCC) (2011), Bipolar disorder (CMS-HCC) (2013), Cannabis use disorder, COPD (chronic obstructive pulmonary disease) (CMS-HCC) (2022), Crohn's disease (CMS-HCC) (1990), Depression (2007), DVT (deep venous thrombosis) (CMS-HCC) (02/08/2010), Myalgia, Rhinovirus infection, and SBO (small bowel obstruction) (CMS-HCC) (2011). who presents with 2 days of right-sided lower abdominal pain radiating through to his right side of his low back accompanied by approximately 10 episodes of bloody diarrhea.  Also states that he has had 3 total episodes of dark green emesis accompanied by some dark-colored red blood.  Additionally he reports that today at approximately 10 AM he had a oral temperature of 101.2 which he treated with acetaminophen  with resolution of his fever.  He actively endorses nausea and severe pain that he rates at a 9 out of 10 focused in the right lower quadrant of his abdomen.  He does endorse increased frequency of urination.     Still has an extensive history of abdominal surgeries and reports that he is been taking prednisone  20 mg daily for the course of the last month as well as Humira  weekly to treat this Crohn's symptoms.    He otherwise denies any headache, chest pain, shortness of breath, other abdominal pain, urinary symptoms including difficulty or pain when urinating or any hematuria, constipation, rashes, any abdominal trauma, any swelling.     DDx/MDM: Crohn's flare, bowel obstruction (large vs small), intra-abdominal abscess, proctitis, urinary tract infection, pyelonephritis, enteric fistula, ventral wall hernia,     Diagnostic workup as below. Will treat patient with IV dilaudid , antiemetics, CT abdomen/pelvis with contrast. Will also order oral contrast regimen pending improvement of nausea symptoms with medication     Orders Placed This Encounter   Procedures    CT Abdomen Pelvis with oral and IV Contrast    CBC w/ Differential    Comprehensive Metabolic Panel    Sedimentation rate, manual    Lipase    Urinalysis with Microscopy with Culture Reflex    C-reactive protein    Lactate Sepsis, Venous    Potassium Level    AST    ED Admit Decision       ED Course  ED Course as of 04/26/23 0122   Sat Apr 25, 2023   1811 CBC is unremarkable.  ESR is within normal limits at 14.    1814 On reevaluation in the room patient is reporting improvement of his pain symptoms following the Dilaudid  from 9 out of 10 to a 5 out of 10.  He states the Zofran  has mildly improved his nausea symptoms though he continues to endorse it and says that he is not able to drink oral contrast at this time   1817 CRP elevated to 12   1819 Lactate is within normal limits at 0.8   1832 CMP is notable for hemolyzed specimen necessitating repeat draw potassium.  Otherwise electrolytes are within normal limits, creatinine is within normal limits at 0.97 which is down trended from his previous result.    1908 On reevaluation in the room patient is now reporting that his pain is worsened again to an 8 out of 10.  He reports that his  nausea was unaffected by the Zofran .  Will administer an additional dose of IV Dilaudid  as well as IV promethazine  to treat with nausea symptoms as patient reports this worked previously for him.    1915 Lipase WNL   Sun Apr 26, 2023   9964 Urinalysis is without signs of acute infection   0102 CT imaging of abdomen and pelvis with oral and IV contrast obtained and pending interpretation. Will sign this patient out to night team pending review. Likely disposition for this patient is admission with medicine service and GI team consultation.    0109 IMPRESSION:  - Bowel wall thickening and wall enhancement of the neoterminal ileum, suggesting active inflammation compatible with history of Crohn's disease.     0115 Will plan to treat this patient with Zosyn , IV for concerns of ileitis. Will page to [facility] for admission to medicine.          Discussion of Management with other Physicians, QHP or Appropriate Source: None  Independent Interpretation of Studies: If applicable, documented in ED course above.  I have reviewed recent and relevant previous record, including: Outpatient notes - previous ED visits  Escalation of Care including OBS/Admission/Transfer was considered: pending  Social Determinants that significantly affected care: none  Prescription drugs considered but not prescribed: Antibiotics - no infectious etiology at this time pending imaging  Diagnostic tests considered but not performed: none    Additional History Elements     Chief Complaint  Chief Complaint   Patient presents with    Abdominal Pain     Additional Historian(s): none available    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    Myalgia     Rhinovirus infection     SBO (small bowel obstruction) (CMS-HCC) 2011       Past Surgical History:   Procedure Laterality Date    CHOLECYSTECTOMY      COLON SURGERY      partial resection    HERNIA REPAIR      PR CLOSE ENTEROSTOMY,RESEC+COLOREC ANAS Midline 11/01/2019    Procedure: CLO ENTEROSTOMY; W/RESECT COLORECTAL ANASTOM;  Surgeon: Aloysius Euell Quale, MD;  Location: MAIN OR [place];  Service: Gastrointestinal    PR COLONOSCOPY FLX DX W/COLLJ SPEC WHEN PFRMD Left 01/07/2013    Procedure: COLONOSCOPY, FLEXIBLE, PROXIMAL TO SPLENIC FLEXURE; DIAGNOSTIC, W/WO COLLECTION SPECIMEN BY BRUSH OR WASH;  Surgeon: Liliane JENEANE Marker, MD;  Location: GI PROC[facility]URES [facility];  Service: Gastroenterology    PR COLONOSCOPY FLX DX W/COLLJ SPEC WHEN PFRMD  03/09/2015    Procedure: COLONOSCOPY, FLEXIBLE, PROXIMAL TO SPLENIC FLEXURE; DIAGNOSTIC, W/WO COLLECTION SPECIMEN BY BRUSH OR WASH;  Surgeon: Artist Jayson Leather, MD;  Location: GI PROCEDURES MEM[facility];  Service: Gastroenterology    PR COLONOSCOPY FLX DX W/COLLJ SPEC WHEN PFRMD N/A 10/28/2019    Procedure: COLONOSCOPY, FLEXIBLE, PROXIMAL TO SPLENIC FLEXURE; DIAGNOSTIC, W/WO COLLECTION SPECIMEN BY BRUSH OR WASH;  Surgeon: Derick Bring, MD;  Location: GI PROCEDURES MEMORIAL  [facility];  Service: Gastroenterology    PR COLONOSCOPY FLX DX W/COLLJ SPEC WHEN PFRMD N/A 10/01/2022    Procedure: COLONOSCOPY, FLEXIBLE, PROXIMAL TO SPLENIC FLEXURE; DIAGNOSTIC, W/WO COLLECTION SPECIMEN BY BRUSH OR WASH;  Surgeon: Darrick Peck, MD;  Location: GI PROCEDURES MEM[facility];  Service: Gastroenterology    PR EXPLORATORY OF ABDOMEN Midline 09/02/2019    Procedure: EXPLORATORY LAPAROTOMY, EXPLORATORY CELIOTOMY WITH OR WITHOUT BIOPSY(S);  Surgeon: Aloysius Euell Quale, MD;  Location: MAIN OR [facility];  Service: Gastrointestinal    PR EXPLORATORY OF ABDOMEN Midline 11/01/2019    Procedure: EXPLORATORY LAPAROTOMY, EXPLORATORY CELIOTOMY WITH OR WITHOUT BIOPSY(S);  Surgeon: Aloysius Euell Quale, MD;  Location: MAIN OR [facility];  Service: Gastrointestinal    PR FIX FINGER,VOLAR PLATE,I-P JT Left 08/07/2022    Procedure: REPAIR AND RECONSTRUCTION, FINGER, VOLAR PLATE, INTERPHALANGEAL JOINT;  Surgeon: Bonifacio Cordella HERO, MD;  Location: MAIN OR [facility];  Service: Orthopedics    PR FREEING BOWEL ADHESION,ENTEROLYSIS N/A 11/01/2019    Procedure: Enterolysis (Separt Proc);  Surgeon: Aloysius Euell Quale, MD;  Location: MAIN OR [facility];  Service: Gastrointestinal    PR ILEOSCOPY THRU STOMA,BIOPSY N/A 10/14/2019    Procedure: LYNETT; LORN 1/MX;  Surgeon: Thedora Alm Plain, MD;  Location: GI PROCEDURES MEMOR[facility];  Service: Gastroenterology    PR PART REMOVAL COLON W COLOSTOMY Midline 09/02/2019    Procedure: COLECTOMY, PARTIAL; WITH SKIN LEVEL CECOSTOMY OR COLOSTOMY;  Surgeon: Aloysius Euell Quale, MD;  Location: MAIN OR [facility];  Service: Gastrointestinal    PR REPAIR INCISIONAL HERNIA,REDUCIBLE Midline 09/02/2019    Procedure: REPAIR INIT INCISIONAL OR VENTRAL HERNIA; REDUCIBLE;  Surgeon: Arnette Darice Burnet, MD;  Location: MAIN OR [place];  Service: Trauma    PR REPAIR INTERCARP/CARP-METACARP JT Left 08/07/2022    Procedure: VELLA MEAD;  Surgeon: Bonifacio Cordella HERO, MD;  Location: MAIN OR [facility];  Service: Orthopedics    PR UPPER GI ENDOSCOPY,BIOPSY N/A 01/07/2013    Procedure: UGI ENDOSCOPY; WITH BIOPSY, SINGLE OR MULTIPLE;  Surgeon: Liliane JENEANE Marker, MD;  Location: GI PROCEDURES [facility];  Service: Gastroenterology    PR UPPER GI ENDOSCOPY,BIOPSY N/A 10/14/2019    Procedure: UGI ENDOSCOPY; WITH BIOPSY, SINGLE OR MULTIPLE;  Surgeon: Thedora Alm Plain, MD;  Location: GI PROCEDURES [facility];  Service: Gastroenterology    PR UPPER GI ENDOSCOPY,DIAGNOSIS N/A 10/01/2022    Procedure: UGI ENDO, INCLUDE ESOPHAGUS, STOMACH, & DUODENUM &/OR JEJUNUM; DX W/WO COLLECTION SPECIMN, BY BRUSH OR WASH;  Surgeon: Darrick Peck, MD;  Location: GI PROC[facility]URES [facility];  Service: Gastroenterology       Allergies  Morphine  and Toradol [ketorolac]    Family History  Family History   Problem Relation Age of Onset    Breast cancer Mother     Diabetes Maternal Aunt     Colon cancer Maternal Grandmother     Prostate cancer Maternal Grandfather     Cancer Paternal Grandmother     Crohn's disease Neg Hx        Social History  Social History     Tobacco Use    Smoking status: Every Day     Current packs/day: 0.30     Average packs/day: 0.5 packs/day for 21.6 years (10.3 ttl pk-yrs)     Types: Cigarettes     Start date: 10/02/2001     Passive exposure: Past    Smokeless tobacco: Never    Tobacco comments:     Reduced use from 10cpd to 5cpd, though relights each one. Motivated to quit.    Vaping Use  Vaping status: Some Days    Substances: Nicotine     Passive vaping exposure: Yes   Substance Use Topics    Alcohol use: No     Alcohol/week: 0.0 standard drinks of alcohol    Drug use: Yes     Types: Marijuana     Comment: Smokes 2-3 joints/day        Physical Exam     VITAL SIGNS:      Vitals:    04/25/23 1623 04/25/23 1626 04/25/23 1830 04/25/23 2254   BP:  116/83 93/55 108/64   Pulse: 65 68 65 64   Resp:  18 16 14    Temp:  36.2 ??C (97.1 ??F) 36.3 ??C (97.3 ??F) 36.5 ??C (97.7 ??F)   TempSrc:  Oral Oral    SpO2: 97% 97% 100% 100%   Weight:  65.8 kg (145 lb)       Constitutional: Alert and oriented. Minor distress 2/2 pain.  HEENT: Normocephalic and atraumatic. Conjunctivae injected. No congestion. Moist mucous membranes. Poor dentition.   Cardiovascular: Rate as above, regular rhythm. Normal and symmetric distal pulses. Brisk capillary refill. Normal skin turgor.  Respiratory: Normal respiratory effort. Breath sounds are notable for faint wheezing bibasilar. There are no crackles heard.  Gastrointestinal: Abdomen is soft. Tender to palpation across lower abdomen, particularly on RLQ. Inferior to umbilicus in ventral wall, there is a hernia sac with easily reducible contents and without erythema, induration or significant pain. +CVA tenderness on patient right side and across right flank. There are older well healed post abdominal surgical scars. Abdomen without rebound or rigidity   Genitourinary: With nurse chaperone Izetta Biles RN in room, performed rectal exam. There is a anal fissure evident at ~7pm position without drainage, overlying erythema, or induration. FOBT is positive.   Musculoskeletal: Non-tender with normal range of motion in all extremities for baseline. Has cane present in bed.   Neurologic: Normal speech and language. No gross focal neurologic deficits are appreciated. Patient is moving all extremities equally, face is symmetric at rest and with speech.  Skin: Skin is warm, dry and intact. No rash noted.  Psychiatric: Mood and affect are normal. Speech and behavior are normal.     Radiology     CT Abdomen Pelvis with oral and IV Contrast   Final Result   - Bowel wall thickening and wall enhancement of the neoterminal ileum, suggesting active inflammation compatible with history of Crohn's disease.   - Additional chronic and incidental findings within the body of the report.             Pertinent labs & imaging results that were available during my care of the patient were independently interpreted by me and considered in my medical decision making (see chart for details).    Portions of this record have been created using Scientist, clinical (histocompatibility and immunogenetics). Dictation errors have been sought, but may not have been identified and corrected.         Jennefer Gee, MD  Resident  04/26/23 CATHE       Jennefer Gee, MD  Resident  04/26/23 [phone]

## 2023-04-26 NOTE — Unmapped (Signed)
Problem: Adult Inpatient Plan of Care  Goal: Plan of Care Review  Outcome: Ongoing - Unchanged  Goal: Patient-Specific Goal (Individualized)  Outcome: Ongoing - Unchanged  Goal: Absence of Hospital-Acquired Illness or Injury  Outcome: Ongoing - Unchanged  Intervention: Identify and Manage Fall Risk  Recent Flowsheet Documentation  Taken 04/26/2023 1200 by Dian Situ, RN  Safety Interventions:   fall reduction program maintained   low bed  Taken 04/26/2023 0800 by Dian Situ, RN  Safety Interventions:   fall reduction program maintained   low bed  Intervention: Prevent Skin Injury  Recent Flowsheet Documentation  Taken 04/26/2023 1200 by Dian Situ, RN  Positioning for Skin: Supine/Back  Taken 04/26/2023 1000 by Dian Situ, RN  Positioning for Skin: Supine/Back  Taken 04/26/2023 0800 by Dian Situ, RN  Positioning for Skin: Supine/Back  Goal: Optimal Comfort and Wellbeing  Outcome: Ongoing - Unchanged  Goal: Readiness for Transition of Care  Outcome: Ongoing - Unchanged  Goal: Rounds/Family Conference  Outcome: Ongoing - Unchanged     Problem: Fall Injury Risk  Goal: Absence of Fall and Fall-Related Injury  Outcome: Ongoing - Unchanged  Intervention: Promote Injury-Free Environment  Recent Flowsheet Documentation  Taken 04/26/2023 1200 by Dian Situ, RN  Safety Interventions:   fall reduction program maintained   low bed  Taken 04/26/2023 0800 by Dian Situ, RN  Safety Interventions:   fall reduction program maintained   low bed     Problem: Self-Care Deficit  Goal: Improved Ability to Complete Activities of Daily Living  Outcome: Ongoing - Unchanged     Problem: Pain Acute  Goal: Optimal Pain Control and Function  Outcome: Ongoing - Unchanged     Problem: Infection  Goal: Absence of Infection Signs and Symptoms  Outcome: Ongoing - Unchanged

## 2023-04-27 LAB — CBC W/ AUTO DIFF
BASOPHILS ABSOLUTE COUNT: 0 10*9/L (ref 0.0–0.1)
BASOPHILS RELATIVE PERCENT: 0.7 %
EOSINOPHILS ABSOLUTE COUNT: 0.1 10*9/L (ref 0.0–0.5)
EOSINOPHILS RELATIVE PERCENT: 2 %
HEMATOCRIT: 38.1 % — ABNORMAL LOW (ref 39.0–48.0)
HEMOGLOBIN: 12.3 g/dL — ABNORMAL LOW (ref 12.9–16.5)
LYMPHOCYTES ABSOLUTE COUNT: 1.6 10*9/L (ref 1.1–3.6)
LYMPHOCYTES RELATIVE PERCENT: 31.6 %
MEAN CORPUSCULAR HEMOGLOBIN CONC: 32.3 g/dL (ref 32.0–36.0)
MEAN CORPUSCULAR HEMOGLOBIN: 27.8 pg (ref 25.9–32.4)
MEAN CORPUSCULAR VOLUME: 86.2 fL (ref 77.6–95.7)
MONOCYTES ABSOLUTE COUNT: 0.7 10*9/L (ref 0.3–0.8)
MONOCYTES RELATIVE PERCENT: 12.8 %
NEUTROPHILS ABSOLUTE COUNT: 2.7 10*9/L (ref 1.8–7.8)
NEUTROPHILS RELATIVE PERCENT: 52.9 %
PLATELET COUNT: >121 10*9/L — ABNORMAL LOW (ref 150–450)
RED BLOOD CELL COUNT: 4.42 10*12/L (ref 4.26–5.60)
RED CELL DISTRIBUTION WIDTH: 14.7 % (ref 12.2–15.2)
WBC ADJUSTED: 5.2 10*9/L (ref 3.6–11.2)

## 2023-04-27 LAB — C-REACTIVE PROTEIN

## 2023-04-27 LAB — BASIC METABOLIC PANEL
ANION GAP: 9 mmol/L (ref 5–14)
BLOOD UREA NITROGEN: 5 mg/dL — ABNORMAL LOW (ref 9–23)
BUN / CREAT RATIO: 6
CALCIUM: 8.2 mg/dL — ABNORMAL LOW (ref 8.7–10.4)
CHLORIDE: 112 mmol/L — ABNORMAL HIGH (ref 98–107)
CO2: 18 mmol/L — ABNORMAL LOW (ref 20.0–31.0)
CREATININE: 0.9 mg/dL
EGFR CKD-EPI (2021) MALE: >90 mL/min/{1.73_m2} (ref >=60)
GLUCOSE RANDOM: 102 mg/dL (ref 70–179)
POTASSIUM: 3.8 mmol/L (ref 3.4–4.8)
SODIUM: 139 mmol/L (ref 135–145)

## 2023-04-27 LAB — MAGNESIUM: MAGNESIUM: 1.6 mg/dL (ref 1.6–2.6)

## 2023-04-27 LAB — SLIDE REVIEW

## 2023-04-27 MED ADMIN — prochlorperazine (COMPAZINE) injection 5 mg: 5 mg | INTRAVENOUS | @ 07:00:00 | Stop: 2023-04-27

## 2023-04-27 MED ADMIN — cholecalciferol (vitamin D3 25 mcg (1,000 units)) tablet 25 mcg: 25 ug | ORAL | @ 12:00:00 | Stop: 2023-04-27

## 2023-04-27 MED ADMIN — oxyCODONE (ROXICODONE) immediate release tablet 10 mg: 10 mg | ORAL | @ 17:00:00 | Stop: 2023-04-27

## 2023-04-27 MED ADMIN — enoxaparin (LOVENOX) syringe 40 mg: 40 mg | SUBCUTANEOUS | @ 14:00:00 | Stop: 2023-04-27

## 2023-04-27 MED ADMIN — QUEtiapine (SEROQUEL) tablet 100 mg: 100 mg | ORAL | @ 03:00:00

## 2023-04-27 MED ADMIN — HYDROmorphone (PF) injection Syrg 0.5 mg: .5 mg | INTRAVENOUS | @ 07:00:00 | Stop: 2023-04-27

## 2023-04-27 MED ADMIN — HYDROmorphone (PF) injection Syrg 0.5 mg: .5 mg | INTRAVENOUS | @ 11:00:00 | Stop: 2023-04-27

## 2023-04-27 MED ADMIN — HYDROmorphone (PF) injection Syrg 0.5 mg: .5 mg | INTRAVENOUS | @ 03:00:00 | Stop: 2023-04-29

## 2023-04-27 MED ADMIN — prochlorperazine (COMPAZINE) injection 5 mg: 5 mg | INTRAVENOUS | @ 17:00:00 | Stop: 2023-04-27

## 2023-04-27 MED ADMIN — pantoprazole (Protonix) EC tablet 40 mg: 40 mg | ORAL | @ 12:00:00 | Stop: 2023-04-27

## 2023-04-27 NOTE — Unmapped (Signed)
Luminal Gastroenterology Consult Service   Progress Note         Assessment and Recommendations:   Jerome Mula. is a 47 y.o. 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 The patient is seen in consultation at the request of Mack Guise, MD ([facility] for Crohn's disease.    Crohn's disease - Abdominal pain  Patient has been on weekly maintenance Humira with last endoscopy 09/2022 demonstrating disease remission. Patient has had multiple CT scans, including this admission, demonstrating chronic neoterminal ileum inflammation that did not previously represent active Crohn's disease endoscopically. Patient denies any changes in his bowel habits and predominantly complains of RLQ abdominal pain. This pain is chronic in nature and patient has presented to ER frequently with similar symptoms. Symptoms likely related to flare of chronic abdominal pain and development of opioid dependent pain disorder.     Recommendations:  - Continue supportive care with early weaning of all opioids   - No acute indication for endoscopic disease assessment  - Defer CD flare treatment with corticosteroids at this time  - Will follow up Humira levels/antibodies  - Ok to discharge home from GI standpoint when clinically improved  - Has follow-up scheduled with me in IBD clinic on 10/22  - Will try to get a pain management plan for him in place for the next time he presents to our ED (message sent to Dr. Desma Maxim)     Issues Impacting Complexity of Management:  -None    Recommendations discussed with the patient's primary team. We will sign-off at this time, please re-contact if additional questions or a new need for consultation arises.    Subjective:   Pt with ongoing RLQ abdominal pain, nausea, vomiting. Stools continue to be loose.     Objective:   Temp:  [36.5 ??C (97.7 ??F)-37.2 ??C (99 ??F)] 36.5 ??C (97.7 ??F)  Heart Rate:  [63-78] 78  Resp:  [16-18] 18  BP: (103-134)/(66-85) 125/84  SpO2:  [95 %-100 %] 100 %    Wt Readings from Last 6 Encounters:   04/26/23 65.8 kg (145 lb)   03/30/23 65.8 kg (145 lb)   01/28/23 64.6 kg (142 lb 6.4 oz)   01/25/23 64.9 kg (143 lb)   11/13/22 63 kg (138 lb 12.8 oz)   10/28/22 63.5 kg (140 lb)      Gen: WDWN male in NAD, answers questions appropriately, pressured speech  Abdomen: Soft, large protruding ventral hernia, easily reducible, tenderness along palpation of lower hernia and RLQ, non-distended, no rebound/guarding, no hepatosplenomegaly  Extremities: No edema in the BLEs    Pertinent Labs/Studies:  -I have reviewed the patient's labs from 9/23 which show stable Hgb, stable renal function (SCr, electrolytes), and worsening thrombocytopenia

## 2023-04-27 NOTE — Unmapped (Signed)
Problem: Adult Inpatient Plan of Care  Goal: Plan of Care Review  Outcome: Progressing  Goal: Patient-Specific Goal (Individualized)  Outcome: Progressing  Goal: Absence of Hospital-Acquired Illness or Injury  Outcome: Progressing  Intervention: Identify and Manage Fall Risk  Recent Flowsheet Documentation  Taken 04/26/2023 2000 by Marcelline Mates, RN  Safety Interventions:   low bed   lighting adjusted for tasks/safety   room near unit station   nonskid shoes/slippers when out of bed  Intervention: Prevent Skin Injury  Recent Flowsheet Documentation  Taken 04/26/2023 2000 by Marcelline Mates, RN  Positioning for Skin: Supine/Back  Device Skin Pressure Protection: adhesive use limited  Skin Protection: adhesive use limited  Intervention: Prevent Infection  Recent Flowsheet Documentation  Taken 04/26/2023 2000 by Marcelline Mates, RN  Infection Prevention:   cohorting utilized   rest/sleep promoted   single patient room provided   personal protective equipment utilized  Goal: Optimal Comfort and Wellbeing  Outcome: Progressing  Goal: Readiness for Transition of Care  Outcome: Progressing  Goal: Rounds/Family Conference  Outcome: Progressing     Problem: Fall Injury Risk  Goal: Absence of Fall and Fall-Related Injury  Outcome: Progressing  Intervention: Promote Scientist, clinical (histocompatibility and immunogenetics) Documentation  Taken 04/26/2023 2000 by Marcelline Mates, RN  Safety Interventions:   low bed   lighting adjusted for tasks/safety   room near unit station   nonskid shoes/slippers when out of bed     Problem: Self-Care Deficit  Goal: Improved Ability to Complete Activities of Daily Living  Outcome: Progressing     Problem: Pain Acute  Goal: Optimal Pain Control and Function  Outcome: Progressing     Problem: Infection  Goal: Absence of Infection Signs and Symptoms  Outcome: Progressing  Intervention: Prevent or Manage Infection  Recent Flowsheet Documentation  Taken 04/26/2023 2000 by Marcelline Mates, RN  Infection Management: aseptic technique maintained  Isolation Precautions: enteric precautions maintained

## 2023-04-27 NOTE — Unmapped (Signed)
Luminal Gastroenterology Consult Service   Initial Consultation         Assessment and Recommendations:   Jerome Verdier. is a 47 y.o. 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 The patient is seen in consultation at the request of Jerome Coder, MD ([facility] for Crohn's disease.    Crohn's disease - Abdominal pain  Patient has been on weekly maintenance Humira with last endoscopy 09/2022 demonstrating disease remission. Patient has had multiple CT scans, including this admission, demonstrating chronic neoterminal ileum inflammation that did not previously represent active chron's disease endoscopically. Patient denies any changes in his bowel habits and predominantly complains of RLQ abdominal pain. This pain is chronic in nature and patient has presented to ER frequently with similar symptoms. Symptoms likely related to flare of chronic abdominal pain and development of opioid dependent pain disorder.     Recommendations:  - Continue supportive care with early weaning of all opioids   - No acute indication for endoscopic disease assessment  - Defer CD flare treatment with corticosteroids at this time  - Will follow up Humira levels/antibodies    Issues Impacting Complexity of Management:  -None    Recommendations discussed with the patient's primary team. We will continue to follow along with you.    Subjective:     HPI:   Patient presented to ER yesterday due to acute RLQ abdominal pain. He states that pain is not related to hernia site. Patient reports that this is similar to his prior crohn's disease flares and that the pain is entirely new. Upon review of record, patient presents to ER at least monthly for similar symptoms.     He reports good appetite but 15 pound unintentional weight loss. Review of documented clinical weights show no evidence of objective weight loss. Patient reports that he is at his baseline number of bowel movements, approximately 2-3 loose/watery non-bloody daily. He has been compliant with Humira, next dose due 9/22.     -I have reviewed the patient's prior records from [facility] outpatient Gastroenterology as summarized in the HPI    Objective:   Temp:  [36.3 ??C (97.3 ??F)-36.6 ??C (97.9 ??F)] 36.6 ??C (97.9 ??F)  Heart Rate:  [61-66] 63  Resp:  [14-20] 20  BP: (89-109)/(60-74) 104/73  SpO2:  [100 %] 100 %    Wt Readings from Last 6 Encounters:   04/26/23 65.8 kg (145 lb)   03/30/23 65.8 kg (145 lb)   01/28/23 64.6 kg (142 lb 6.4 oz)   01/25/23 64.9 kg (143 lb)   11/13/22 63 kg (138 lb 12.8 oz)   10/28/22 63.5 kg (140 lb)      Gen: WDWN male in NAD, answers questions appropriately, pressured speech  Abdomen: Soft, large protruding ventral hernia, easily reducible, tenderness along palpation of lower hernia and RLQ, non-distended, no rebound/guarding, no hepatosplenomegaly  Extremities: No edema in the BLEs    Pertinent Labs/Studies:  -I have reviewed the patient's labs from 9/21-22 which show stable Hgb, stable renal function (SCr, electrolytes), stable LFTs, and slightly elevated CRP    -I have visualized the patient's  CT A/P dated 9/21 which shows enhancement in the neo-TI    CTAP 04/26/23  Impression   - Bowel wall thickening and wall enhancement of the neoterminal ileum, suggesting active inflammation compatible with history of Crohn's disease.   - Additional chronic and incidental findings within the body of the report.     EGD/Colonoscopy 10/01/22:  Impression:              - Small hiatal hernia.   - Normal stomach.  - Normal examined duodenum.  - No specimens collected.  Impression:              - Stool in the entire examined colon.  - Simple Endoscopic Score for Crohn's Disease: 0, mucosal inflammatory changes secondary to Crohn's disease, in remission.  - No specimens collected

## 2023-04-27 NOTE — Unmapped (Signed)
Problem: Adult Inpatient Plan of Care  Goal: Plan of Care Review  Outcome: Progressing  Goal: Patient-Specific Goal (Individualized)  Outcome: Progressing  Goal: Absence of Hospital-Acquired Illness or Injury  Outcome: Progressing  Intervention: Identify and Manage Fall Risk  Recent Flowsheet Documentation  Taken 04/27/2023 1400 by Dian Situ, RN  Safety Interventions:   low bed   fall reduction program maintained  Taken 04/27/2023 1200 by Dian Situ, RN  Safety Interventions:   fall reduction program maintained   low bed  Taken 04/27/2023 1000 by Dian Situ, RN  Safety Interventions:   fall reduction program maintained   low bed  Taken 04/27/2023 0800 by Dian Situ, RN  Safety Interventions:   fall reduction program maintained   low bed  Intervention: Prevent Skin Injury  Recent Flowsheet Documentation  Taken 04/27/2023 1400 by Dian Situ, RN  Positioning for Skin: Sitting in Chair  Taken 04/27/2023 1200 by Dian Situ, RN  Positioning for Skin: Supine/Back  Taken 04/27/2023 1000 by Dian Situ, RN  Positioning for Skin: Supine/Back  Taken 04/27/2023 0800 by Dian Situ, RN  Positioning for Skin: Left  Goal: Optimal Comfort and Wellbeing  Outcome: Progressing  Goal: Readiness for Transition of Care  Outcome: Progressing  Goal: Rounds/Family Conference  Outcome: Progressing     Problem: Fall Injury Risk  Goal: Absence of Fall and Fall-Related Injury  Outcome: Progressing  Intervention: Promote Injury-Free Environment  Recent Flowsheet Documentation  Taken 04/27/2023 1400 by Dian Situ, RN  Safety Interventions:   low bed   fall reduction program maintained  Taken 04/27/2023 1200 by Dian Situ, RN  Safety Interventions:   fall reduction program maintained   low bed  Taken 04/27/2023 1000 by Dian Situ, RN  Safety Interventions:   fall reduction program maintained   low bed  Taken 04/27/2023 0800 by Dian Situ, RN  Safety Interventions:   fall reduction program maintained   low bed     Problem: Self-Care Deficit  Goal: Improved Ability to Complete Activities of Daily Living  Outcome: Progressing     Problem: Pain Acute  Goal: Optimal Pain Control and Function  Outcome: Progressing     Problem: Infection  Goal: Absence of Infection Signs and Symptoms  Outcome: Progressing

## 2023-04-27 NOTE — Unmapped (Addendum)
Jerome Kennedy. is a 47 y.o. male whose presentation is complicated by Crohn's disease of small and large intestine dx 1990 s/p ileocecectomy 2003 c/b SBO due to strictures s/p right-sided hemicolectomy and end-ileostomy 2021 with reversal, ventral hernia, recurrent C diff infections, Bipolar disorder, Iron Deficiency anemia, DVT in 2011 off AC, that presented to [facility] with RLQ abdominal pain and neoterminal ileum inflammation c/f Crohns Disease flare. His hospital course is detailed below:    Active Problems     RLQ abdominal pain - moderate-severe Crohn's disease with exacerbation - Hx ileocecectomy 2003 w/ SBO's - Hx C. Difficile colitis  3-day history of progressive severe sharp 9/10 right lower quadrant abdominal pain associated with bilious nausea/vomiting, 4 episodes of painful liquid brown stools mixed with blood.  Nondistended abdomen w/ large reducible ventral abdominal hernia.  Abdomen tender right lower quadrant with guarding.  History of recurrent admissions and ED visits for abdominal pain.  Follows with [facility] for Crohn's disease.  Last seen 6 months ago by Dr. Chauncy Passy and was continued on weekly Humira injections.  Reported to have failed Remicade in the past years ago, but endoscopy while on Humira showed no active disease February 2024.  Reports excellent compliance with Humira. CT evidence of neoterminal ileum inflammation w/ elevated CRP and hematochezia, most concerning for Crohn's disease flare. If this is due to Crohns disease, would represent treatment failure on Humira. No evidence of complete obstruction on CT but did show dilated loops of small bowel.  GI consulted with low concern for Crohn's flare. C diff assay negative. Diet was advanced and tolerated well. Of note, patient with timer set for PRN dilaudid intervals. Discharged with no changes to medication regimen.     Nausea/vomiting  Reported 4-5 episodes bilious vomiting prior to admission. Reported to have mentioned blood in the ED. Moderately dilated small bowel in setting of RLQ pain above w/o complete obstruction. No episodes of emesis during admission     Hypoalbuminemia  Likely in setting of poor nutrition. No hx liver disease. Encouraged balanced, low fiber diet.     Active Tobacco use  Smokes 5-10 cigarettes daily. Provided nicotine replacement during admission.     Chronic Problems     Bipolar 1 disorder - Hx Homelessness  Currently stable taking medication nightly. Continued home quetiapine 100 mg nightly     GERD  Continued Protonix 40mg  daily     Iron Deficiency Anemia  PO iron supplements during admission.     History of RUE DVT 2011  AC was discontinued by hematology in setting of recurrent GI bleeds. Received DVT ppx Lovenox only during admission.

## 2023-04-27 NOTE — Unmapped (Signed)
Physician Discharge Summary Jerome Kennedy  1 [facility] OBSERVATION Jerome Kennedy  [address]  [place] [address]  Dept: [phone]  Loc: [phone]     Identifying Information:   Jerome Kennedy  August 09, 1975  [id number]    Primary Care Physician: Aleen Campi, MD     Code Status: Full Code    Admit Date: 04/25/2023    Discharge Date: 04/27/2023     Discharge To: Home    Discharge Service: [facility] - General Medicine Floor Team MED L - Tower     Discharge Attending Physician: Mack Guise, MD    Discharge Diagnoses:   Principal Problem:    Abdominal pain (POA: Yes)  Active Problems:    Crohn's disease (CMS-HCC) (POA: Yes)    Exacerbation of Crohn's disease (CMS-HCC) (POA: Yes)    Iron deficiency anemia (POA: Yes)    Bipolar 1 disorder (CMS-HCC) (POA: Yes)    Hx of deep venous thrombosis (POA: Not Applicable)  Resolved Problems:    * No resolved Kennedy problems. [facility] Course:   Jerome Kennedy. is a 47 y.o. male whose presentation is complicated by Crohn's disease of small and large intestine dx 1990 s/p ileocecectomy 2003 c/b SBO due to strictures s/p right-sided hemicolectomy and end-ileostomy 2021 with reversal, ventral hernia, recurrent C diff infections, Bipolar disorder, Iron Deficiency anemia, DVT in 2011 off AC, that presented to [facility] with RLQ abdominal pain and neoterminal ileum inflammation c/f Crohns Disease flare. His Kennedy course is detailed below:    Active Problems     RLQ abdominal pain - moderate-severe Crohn's disease with exacerbation - Hx ileocecectomy 2003 w/ SBO's - Hx C. Difficile colitis  3-day history of progressive severe sharp 9/10 right lower quadrant abdominal pain associated with bilious nausea/vomiting, 4 episodes of painful liquid brown stools mixed with blood.  Nondistended abdomen w/ large reducible ventral abdominal hernia.  Abdomen tender right lower quadrant with guarding.  History of recurrent admissions and ED visits for abdominal pain.  Follows with [facility] for Crohn's disease.  Last seen 6 months ago by Dr. Chauncy Passy and was continued on weekly Humira injections.  Reported to have failed Remicade in the past years ago, but endoscopy while on Humira showed no active disease February 2024.  Reports excellent compliance with Humira. CT evidence of neoterminal ileum inflammation w/ elevated CRP and hematochezia, most concerning for Crohn's disease flare. If this is due to Crohns disease, would represent treatment failure on Humira. No evidence of complete obstruction on CT but did show dilated loops of small bowel.  GI consulted with low concern for Crohn's flare. C diff assay negative. Diet was advanced and tolerated well. Of note, patient with timer set for PRN dilaudid intervals. Discharged with no changes to medication regimen.     Nausea/vomiting  Reported 4-5 episodes bilious vomiting prior to admission. Reported to have mentioned blood in the ED. Moderately dilated small bowel in setting of RLQ pain above w/o complete obstruction. No episodes of emesis during admission     Hypoalbuminemia  Likely in setting of poor nutrition. No hx liver disease. Encouraged balanced, low fiber diet.     Active Tobacco use  Smokes 5-10 cigarettes daily. Provided nicotine replacement during admission.     Chronic Problems     Bipolar 1 disorder - Hx Homelessness  Currently stable taking medication nightly. Continued home quetiapine 100 mg nightly     GERD  Continued Protonix 40mg  daily  Iron Deficiency Anemia  PO iron supplements during admission.     History of RUE DVT 2011  AC was discontinued by hematology in setting of recurrent GI bleeds. Received DVT ppx Lovenox only during admission.         Outpatient Provider Follow Up Issues:   Abdominal pain    Touchbase with Outpatient Provider:  Warm Handoff: Completed on 04/27/23 by Berlin Hun, MD  (Intern) via [facility] Message    Procedures:  None  ______________________________________________________________________  Discharge Medications:      Your Medication List        CONTINUE taking these medications      cholecalciferol (vitamin D3 25 mcg (1,000 units)) 1,000 unit (25 mcg) tablet  Take 1 tablet (25 mcg total) by mouth daily.     empty container Misc  Use as directed     HUMIRA(CF) PEN 40 mg/0.4 mL injection  Generic drug: adalimumab  Inject the contents of 1 pen (40 mg total) under the skin every seven (7) days.     OPTICHAMBER DIAMOND VHC Spcr  Generic drug: inhalational spacing device     pantoprazole 40 MG tablet  Commonly known as: Protonix  Take 1 tablet (40 mg total) by mouth daily.     QUEtiapine 100 MG tablet  Commonly known as: SEROQUEL  Take 1 tablet (100 mg total) by mouth nightly.              Allergies:  Morphine and Toradol [ketorolac]  ______________________________________________________________________  Pending Test Results:  Pending Labs       Order Current Status    Adalimumab/Adalimumab Ab In process    Calprotectin, Stool In process    GI Pathogen Panel In process    CBC w/ Differential Preliminary result    CBC w/ Differential Preliminary result            Most Recent Labs:  All lab results last 24 hours -   Recent Results (from the past 24 hour(s))   Basic Metabolic Panel    Collection Time: 04/26/23  6:37 PM   Result Value Ref Range    Sodium 144 135 - 145 mmol/L    Potassium 3.8 3.5 - 5.1 mmol/L    Chloride 112 (H) 98 - 107 mmol/L    CO2 27.0 20.0 - 31.0 mmol/L    Anion Gap 5 5 - 14 mmol/L    BUN 10 9 - 23 mg/dL    Creatinine 1.61 0.96 - 1.18 mg/dL    BUN/Creatinine Ratio 11     eGFR CKD-EPI (2021) Male >90 >=60 mL/min/1.6m2    Glucose 91 70 - 179 mg/dL    Calcium 8.0 (L) 8.7 - 10.4 mg/dL   Magnesium Level    Collection Time: 04/26/23  6:37 PM   Result Value Ref Range    Magnesium 1.7 1.6 - 2.6 mg/dL   Hepatic Function Panel    Collection Time: 04/26/23  6:37 PM   Result Value Ref Range Albumin 2.7 (L) 3.4 - 5.0 g/dL    Total Protein 5.1 (L) 5.7 - 8.2 g/dL    Total Bilirubin 0.5 0.3 - 1.2 mg/dL    Bilirubin, Direct 0.45 0.00 - 0.30 mg/dL    AST 12 <=40 U/L    ALT <7 (L) 10 - 49 U/L    Alkaline Phosphatase 123 (H) 46 - 116 U/L   Iron Panel    Collection Time: 04/26/23  6:37 PM   Result Value Ref Range    Iron  23 (L) 65 - 175 ug/dL    TIBC 161 096 - 045 ug/dL    Iron Saturation (%) 9 (L) 20 - 55 %   C-reactive protein    Collection Time: 04/26/23  6:37 PM   Result Value Ref Range    CRP 29.0 (H) <=10.0 mg/L   Basic Metabolic Panel    Collection Time: 04/27/23  3:59 AM   Result Value Ref Range    Sodium      Potassium      Chloride      CO2      Anion Gap      BUN      Creatinine      BUN/Creatinine Ratio      eGFR CKD-EPI (2021) Male      Glucose      Calcium     Magnesium Level    Collection Time: 04/27/23  3:59 AM   Result Value Ref Range    Magnesium     C-reactive protein    Collection Time: 04/27/23  3:59 AM   Result Value Ref Range    CRP     CBC w/ Differential    Collection Time: 04/27/23  3:59 AM   Result Value Ref Range    WBC 5.2 3.6 - 11.2 10*9/L    RBC 4.42 4.26 - 5.60 10*12/L    HGB 12.3 (L) 12.9 - 16.5 g/dL    HCT 40.9 (L) 81.1 - 48.0 %    MCV 86.2 77.6 - 95.7 fL    MCH 27.8 25.9 - 32.4 pg    MCHC 32.3 32.0 - 36.0 g/dL    RDW 91.4 78.2 - 95.6 %    MPV      Platelet 121 (L) 150 - 450 10*9/L   Basic Metabolic Panel    Collection Time: 04/27/23  5:57 AM   Result Value Ref Range    Sodium 139 135 - 145 mmol/L    Potassium 3.8 3.4 - 4.8 mmol/L    Chloride 112 (H) 98 - 107 mmol/L    CO2 18.0 (L) 20.0 - 31.0 mmol/L    Anion Gap 9 5 - 14 mmol/L    BUN 5 (L) 9 - 23 mg/dL    Creatinine 2.13 0.86 - 1.18 mg/dL    BUN/Creatinine Ratio 6     eGFR CKD-EPI (2021) Male >90 >=60 mL/min/1.90m2    Glucose 102 70 - 179 mg/dL    Calcium 8.2 (L) 8.7 - 10.4 mg/dL   Magnesium Level    Collection Time: 04/27/23  5:57 AM   Result Value Ref Range    Magnesium 1.6 1.6 - 2.6 mg/dL       Relevant Studies/Radiology:  ECG 12 Lead    Result Date: 04/26/2023  SINUS BRADYCARDIA OTHERWISE NORMAL ECG WHEN COMPARED WITH ECG OF 22-Feb-2023 20:43, NO SIGNIFICANT CHANGE WAS FOUND Confirmed by Mariane Baumgarten ([id number]) on 04/26/2023 7:05:03 PM    CT Abdomen Pelvis with oral and IV Contrast    Result Date: 04/26/2023  EXAM: CT ABDOMEN PELVIS W CONTRAST ACCESSION: [id number] UN CLINICAL INDICATION: 47 years old with RLQ pain, bloody diarrhea, fever  -  c/f crohns flare  COMPARISON: CT abdomen and pelvis 03/31/2023 TECHNIQUE: A helical CT scan of the abdomen and pelvis was obtained following IV contrast from the lung bases through the pubic symphysis. Images were reconstructed in the axial plane. Coronal and sagittal reformatted images were also provided for further evaluation. FINDINGS: LOWER CHEST: Unremarkable. LIVER: Normal liver contour.  No focal liver lesions. BILIARY: The gallbladder is surgically absent. No biliary ductal dilatation.  SPLEEN: Normal in size and contour. PANCREAS: Normal pancreatic contour.  No focal lesions.  No ductal dilation. ADRENAL GLANDS: Normal appearance of the adrenal glands. KIDNEYS/URETERS: Symmetric renal enhancement.  No hydronephrosis.  No solid renal mass. BLADDER: Unremarkable. REPRODUCTIVE ORGANS: Unremarkable. GI TRACT: Postsurgical changes of right hemicolectomy, ileocecectomy with ileocolic anastomosis. Enteric contrast in the distal small bowel. No extraluminal contrast. Bowel wall thickening of the neoterminal ileum measuring up to 1 cm to with increased mucosal enhancement (4:36, 2:52). Mildly dilated fluid-filled loops of small bowel. No evidence of bowel obstruction. PERITONEUM, RETROPERITONEUM AND MESENTERY: No free air.  No ascites.  No fluid collection. LYMPH NODES: Multiple prominent and enlarged mesenteric lymph nodes, measuring up to 1.1 cm, likely reactive. VESSELS: Hepatic and portal veins are patent.  Normal caliber aorta. Prominent venous vasculature in the anterior and lateral abdominal walls. BONES and SOFT TISSUES: Multilevel degenerative changes of the spine. No aggressive osseous lesions. Marked rectus diastases.     - Bowel wall thickening and wall enhancement of the neoterminal ileum, suggesting active inflammation compatible with history of Crohn's disease. - Additional chronic and incidental findings within the body of the report.    ______________________________________________________________________  Discharge Instructions:                Follow Up instructions and Outpatient Referrals     Discharge instructions          Appointments which have been scheduled for you      May 01, 2023 9:25 AM  (Arrive by 9:15 AM)  RETURN CONTINUITY with Caitlyn Lelon Frohlich, MD  [facility]) [address]  [place] [address]  [phone]        May 26, 2023 12:30 PM  (Arrive by 12:15 PM)  RETURN  GENERAL with Jeanann Lewandowsky, MD  [facility] [place] [facility]) [address]  [facility] [address]  [place] [address]  [phone]             ______________________________________________________________________  Discharge Day Services:  BP 125/84  - Pulse 78  - Temp 36.5 ??C (97.7 ??F) (Oral)  - Resp 18  - Ht 172.7 cm (5' 7.99)  - Wt 65.8 kg (145 lb)  - SpO2 100%  - BMI 22.05 kg/m??     Pt seen on the day of discharge and determined appropriate for discharge.    Condition at Discharge: fair    Length of Discharge: I spent greater than 30 mins in the discharge of this patient.

## 2023-04-29 MED FILL — EMPTY CONTAINER: 120 days supply | Qty: 1 | Fill #0

## 2023-04-29 MED FILL — HUMIRA PEN CITRATE FREE 40 MG/0.4 ML: SUBCUTANEOUS | 28 days supply | Qty: 4 | Fill #4

## 2023-04-30 NOTE — Unmapped (Unsigned)
[facility]  Established Patient Clinic Note    Assessment & Plan  Jerome [facility]. is a 47 y.o.male    Assessment & Plan      Attending: Trixie Rude, MD    Subjective   Jerome [facility] is a 47 y.o. male coming to clinic today for the following issues:    No chief complaint on file.      History of Present Illness:    ***  ***  ***    ROS:    ROS    I have reviewed past medical history, past surgical history, family history, social history, allergies, health maintenance/care gaps, medications, and problem list, and have updated/reconciled them as indicated.     Objective   There were no vitals taken for this visit.    Physical Exam    Labs & Imaging: I have reviewed pertinent recent labs and imaging in Epic.         [facility] [place] [facility] [place] [facility]  [address], [place], [place] [address]   Telephone [phone]  Fax [phone]  [email or web address]

## 2023-05-01 ENCOUNTER — Ambulatory Visit: Admit: 2023-05-01 | Payer: PRIVATE HEALTH INSURANCE

## 2023-05-05 DIAGNOSIS — K50018 Crohn's disease of small intestine with other complication: Principal | ICD-10-CM

## 2023-05-11 NOTE — Unmapped (Signed)
ORTHOPAEDIC RETURN CLINIC NOTE     ASSESSMENT:  Jerome [facility]. is a 47 y.o. male status post left trapeziectomy w/ suture suspensionplasty and volar plate advancement/repair of left thumb MCP joint.   Date of surgery: 08/07/22    Patient has developed L thumb MP joint instability. We discussed our recommendation for L thumb MP fusion. We discussed steps of the procedure. We discussed risks. We discussed expected postoperative outcome. Patient acknowledged understanding.     PLAN:  -OR for L thumb MP joint fusion    INTERIM HISTORY:  Patient complains of new onset instability of thumb. He is worried that he discontinued his splint too early and hurt it while moving.     PHYSICAL EXAM:  General: Well developed, well nourished male in no apparent distress  Musculoskeletal: L thumb IP joint hyperextension with unstable MP joint    IMAGING:  none

## 2023-05-13 ENCOUNTER — Ambulatory Visit
Admit: 2023-05-13 | Discharge: 2023-05-14 | Payer: PRIVATE HEALTH INSURANCE | Attending: Plastic and Reconstructive Surgery | Primary: Plastic and Reconstructive Surgery

## 2023-05-13 ENCOUNTER — Ambulatory Visit: Admit: 2023-05-13 | Discharge: 2023-05-14 | Payer: PRIVATE HEALTH INSURANCE

## 2023-05-13 NOTE — Unmapped (Signed)
PRE-OPERATIVE INFORMATION       Jerome Coho, MD  [facility]  Clinical Support: Irving Burton [phone]  (medical issues)  Surgery Scheduler: Vivien Rota [phone]  Financial Care Counselor: Dimas Aguas [phone]  Estimate Line: [phone]        Surgery Date    If a surgery date is not provided to you today, please allow up to 48 hours for our surgery scheduler to reach out to you to provide a surgery date. If you have not been contacted within 48 hours of your surgical consultation, please contact our scheduler: Vivien Rota at [phone].   If need to cancel your surgery for any reason please call the scheduling office asap to due so.        Surgical Time  Surgical times are given out from 2:00pm-5:00pm on the last business day before your scheduled surgery. A preoperative nurse will call you during this time to confirm when you should arrive for your surgery. If you have not been called by 5:00pm please contact [phone].      The Night Before Your Surgery     DO NOT EAT OR DRINK ANYTHING AFTER MIDNIGHT, unless instructed otherwise. If you are diabetic, check with your primary care physician for special instructions concerning your insulin dosage prior to surgery. Take your usual medications as prescribed, unless otherwise instructed.         The Morning of Your Surgery    You should wear loose fitting clothes that will not be restrictive to your surgical site. Please leave jewelry, credit cards, cash, and other valuables at home. Do not wear any piercings, nail polish, make-up, or metal hair clips the day before surgery. Contact lenses, glasses, and dentures must be removed before surgery. Please make sure to bring a case to store these items in during surgery.     You may take your usual medications as prescribed with a small sip of water, unless otherwise instructed. Do NOT drink a full glass of water.     A RESPONSIBLE ADULT (OVER 18) MUST ACCOMPANY YOU ON THE DAY OF SURGERY AND BE AVAILABLE THROUGHOUT YOUR PROCEDURE IN THE EVENT THERE ARE QUESTIONS OR COMPLICATION. THEY ALSO MUST BE AVAILABLE TO TAKE YOU HOME FOLLOWING YOUR PROCEDURE AS IT WILL NOT BE SAFE FOR YOU TO DRIVE OR TAKE PUBLIC TRANSPORTATION ALONE.     Medications    If you are instructed by Dr. Gus Puma to discontinue taking a medication, such as blood thinners, prior to surgery you will need to contact the prescribing provider of that medication to receive instructions on the safest way to do so.        FOLLOW-UP APPOINTMENTS    Your return appointment will be scheduled approximately 2-3 weeks after surgery, our scheduling office will call you to make a post operative follow up appointment after surgery.    If you need to reschedule this appointment or have questions as to the time of   your appointment please call.      OCCUPATIONAL THERAPY    You may have to attend Occupational Therapy following your surgery. A referral will be placed if it is necessary. If Occupational Therapy is determined to be necessary by your provider someone will be contacting you following your surgery to schedule. Many therapy appointments will be done same day after your first post-operative appointment with your provider.        IF YOU HAVE QUESTIONS or CONCERNS  During normal business hours, call  Dr. Conard Novak clinical support staff member Irving Burton at [phone] for medical issues, or Dr. Conard Novak surgery scheduler, Sande Rives, at [phone] for scheduling concerns.      During evenings/nights/weekends, call [facility] at [phone] and ask for the on-call orthopaedic surgery resident for urgent medical issues.

## 2023-05-17 ENCOUNTER — Ambulatory Visit
Admit: 2023-05-17 | Discharge: 2023-05-18 | Disposition: A | Discharge status: Home / Self Care | Payer: PRIVATE HEALTH INSURANCE | Attending: Emergency Medicine

## 2023-05-17 LAB — URINALYSIS WITH MICROSCOPY WITH CULTURE REFLEX PERFORMABLE
BACTERIA: NONE SEEN /HPF
BILIRUBIN UA: NEGATIVE
BLOOD UA: NEGATIVE
GLUCOSE UA: NEGATIVE
KETONES UA: NEGATIVE
NITRITE UA: NEGATIVE
PH UA: 6 (ref 5.0–9.0)
RBC UA: <1 /HPF (ref <=3)
SPECIFIC GRAVITY UA: 1.03 (ref 1.003–1.030)
SQUAMOUS EPITHELIAL: 2 /HPF (ref 0–5)
UROBILINOGEN UA: <2
WBC UA: 4 /HPF — ABNORMAL HIGH (ref <=2)

## 2023-05-17 LAB — COMPREHENSIVE METABOLIC PANEL
ALBUMIN: 3.2 g/dL — ABNORMAL LOW (ref 3.4–5.0)
ALKALINE PHOSPHATASE: 104 U/L (ref 46–116)
ALT (SGPT): 9 U/L — ABNORMAL LOW (ref 10–49)
ANION GAP: 5 mmol/L (ref 5–14)
AST (SGOT): 12 U/L (ref <=34)
BILIRUBIN TOTAL: 0.3 mg/dL (ref 0.3–1.2)
BLOOD UREA NITROGEN: 12 mg/dL (ref 9–23)
BUN / CREAT RATIO: 11
CALCIUM: 8.7 mg/dL (ref 8.7–10.4)
CHLORIDE: 117 mmol/L — ABNORMAL HIGH (ref 98–107)
CO2: 23 mmol/L (ref 20.0–31.0)
CREATININE: 1.13 mg/dL
EGFR CKD-EPI (2021) MALE: 81 mL/min/{1.73_m2} (ref >=60)
GLUCOSE RANDOM: 118 mg/dL (ref 70–179)
POTASSIUM: 3.7 mmol/L (ref 3.4–4.8)
PROTEIN TOTAL: 6.2 g/dL (ref 5.7–8.2)
SODIUM: 145 mmol/L (ref 135–145)

## 2023-05-17 LAB — CBC W/ AUTO DIFF
BASOPHILS ABSOLUTE COUNT: 0.1 10*9/L (ref 0.0–0.1)
BASOPHILS RELATIVE PERCENT: 0.8 %
EOSINOPHILS ABSOLUTE COUNT: 0.1 10*9/L (ref 0.0–0.5)
EOSINOPHILS RELATIVE PERCENT: 2 %
HEMATOCRIT: 37.8 % — ABNORMAL LOW (ref 39.0–48.0)
HEMOGLOBIN: 12.7 g/dL — ABNORMAL LOW (ref 12.9–16.5)
LYMPHOCYTES ABSOLUTE COUNT: 2.1 10*9/L (ref 1.1–3.6)
LYMPHOCYTES RELATIVE PERCENT: 28.9 %
MEAN CORPUSCULAR HEMOGLOBIN CONC: 33.5 g/dL (ref 32.0–36.0)
MEAN CORPUSCULAR HEMOGLOBIN: 28.8 pg (ref 25.9–32.4)
MEAN CORPUSCULAR VOLUME: 86.1 fL (ref 77.6–95.7)
MEAN PLATELET VOLUME: 8.3 fL (ref 6.8–10.7)
MONOCYTES ABSOLUTE COUNT: 0.4 10*9/L (ref 0.3–0.8)
MONOCYTES RELATIVE PERCENT: 6 %
NEUTROPHILS ABSOLUTE COUNT: 4.5 10*9/L (ref 1.8–7.8)
NEUTROPHILS RELATIVE PERCENT: 62.3 %
PLATELET COUNT: 197 10*9/L (ref 150–450)
RED BLOOD CELL COUNT: 4.39 10*12/L (ref 4.26–5.60)
RED CELL DISTRIBUTION WIDTH: 15 % (ref 12.2–15.2)
WBC ADJUSTED: 7.2 10*9/L (ref 3.6–11.2)

## 2023-05-17 LAB — SEDIMENTATION RATE: ERYTHROCYTE SEDIMENTATION RATE: 10 mm/h (ref 0–15)

## 2023-05-17 LAB — LIPASE: LIPASE: 47 U/L (ref 12–53)

## 2023-05-17 LAB — C-REACTIVE PROTEIN: C-REACTIVE PROTEIN: 8 mg/L (ref <=10.0)

## 2023-05-18 MED ORDER — OXYCODONE-ACETAMINOPHEN 5 MG-325 MG TABLET
ORAL_TABLET | Freq: Three times a day (TID) | ORAL | 0 refills | 2.00000 days | Status: CP | PRN
Start: 2023-05-18 — End: 2023-05-18
  Filled 2023-05-18: qty 6, 2d supply, fill #0

## 2023-05-18 MED ADMIN — morphine 4 mg/mL injection 4 mg: 4 mg | INTRAVENOUS | @ 04:00:00 | Stop: 2023-05-17

## 2023-05-18 MED ADMIN — promethazine (PHENERGAN) 12.5 mg in sodium chloride (NS) 0.9 % 50 mL IVPB: 12.5 mg | INTRAVENOUS | @ 02:00:00 | Stop: 2023-05-17

## 2023-05-18 MED ADMIN — diphenhydrAMINE (BENADRYL) injection: 25 mg | INTRAVENOUS | @ 04:00:00 | Stop: 2023-05-17

## 2023-05-18 MED ADMIN — dicyclomine (BENTYL) capsule 20 mg: 20 mg | ORAL | @ 02:00:00 | Stop: 2023-05-17

## 2023-05-18 NOTE — Unmapped (Signed)
[facility]  Emergency Department Provider Note     ED Clinical Impression     Final diagnoses:   Abdominal pain, unspecified abdominal location (Primary)   Crohn's disease with complication, unspecified gastrointestinal tract location (CMS-HCC)      Impression, Medical Decision Making, ED Course     Impression: 47 y.o. male who has a past medical history of Anemia (04/05/2022), Anxiety, Avascular necrosis of femur head, right (CMS-HCC) (2011), Bipolar disorder (CMS-HCC) (2013), Cannabis use disorder, COPD (chronic obstructive pulmonary disease) (CMS-HCC) (2022), Crohn's disease (CMS-HCC) (1990), Depression (2007), DVT (deep venous thrombosis) (CMS-HCC) (02/08/2010), Myalgia, Rhinovirus infection, and SBO (small bowel obstruction) (CMS-HCC) (2011). who presents with concern for Crohn's flare as described below.  Patient has presented several times for recurrent flare with similar symptoms of abdominal pain, nausea/vomiting and diarrhea.  Patient has RLQ tenderness with guarding on exam as well as a large reducible ventral hernia.  Labs remarkable.  WBC within normal limits at 7.2.  Inflammatory markers negative.  At this time will defer CT abdomen pelvis as patient initially displayed rebound tenderness to RLQ but then did not show rebound tenderness when distracted on exam.  Also engaged in shared decision making with patient and talked about the risk versus benefits of repetitive CTs.  Has had several CT of abdomen and pelvis within the past few months with the most recent being on 04/25/2023 which showed possible inflammation compatible with history of Crohn's disease.  Although per previous GI consult this appears to be stable chronic finding.  Patient states he is okay with deferring CT today due to concerns for radiation exposure.  Concern for hyperalgesia.  Patient requesting Dilaudid and Phenergan for management of pain and nausea, however we will take a stepwise approach to pain management.  Patient vitally stable and was tolerating p.o. intake before symptoms started at around 5 PM, will defer IVF for now and encourage oral hydration.    It is reassuring that patient has reliable follow-up with GI as well as plans for colonoscopy.  After receiving 1 round of morphine 4 mg, patient noted significant improvement.  Was able to tolerate p.o. intake.  At this time he was appropriate for discharge.  Patient requested to have short supply of his Percocet until his next appointment with outpatient GI.  Agreed to provide patient with a 3-day supply.  Discussed discharge instructions with patient including follow-up GI appointment on the 22nd.  Patient agreeable with plan.  Patient stable at time of discharge.    Diagnostic workup as below. Will treat patient with Bentyl, Phenergan and reevaluate pain/nausea.    Orders Placed This Encounter   Procedures    Urine Culture    CBC w/ Differential    Comprehensive Metabolic Panel    Lipase    Sedimentation rate, manual    C-reactive protein    Urinalysis with Microscopy with Culture Reflex       ED Course as of 05/18/23 0351   Sun May 17, 2023   2304 Patient agreeable to morphine 4 mg with Benadryl.  Declines Compazine at this time.     ____________________________________________    The case was discussed with the attending physician, who is in agreement with the above assessment and plan.      History     Chief Complaint  Chief Complaint   Patient presents with    Abdominal Pain       HPI   Jerome Kennedy. is a 47 y.o. male with past  medical history as below who presents with abdominal pain which he rates as an 8-9/10.  Patient states it feels very similar to prior Crohn's flares.  States this is not associated with nausea/vomiting, diarrhea and decreased p.o. intake.  Denies any fevers but reports chills.  States he started noticing symptoms today around 5 PM.  To ibuprofen but did not respond, patient states that he also had prescription for Percocets however he does not have anymore.  Feels RLQ pain is the most significant.  Denies any dizziness or lightheadedness, chest pain/pressure and shortness of breath.  States he has a GI appointment on the 20th for follow-up endoscopy.    Patient was recently discharged from [facility] on 04/27/2023 for similar symptoms with right lower quadrant abdominal pain.  At that time he also had bilious nausea/vomiting as well as diarrhea.  Appears he has a history of recurrent admissions for Crohn's flareups.  At that time GI was consulted and CT showed concern for active inflammation although this is stable chronic finding on imaging.  Colonoscopy at that time did not show any active inflammation.      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    Myalgia     Rhinovirus infection     SBO (small bowel obstruction) (CMS-HCC) 2011       Past Surgical History:   Procedure Laterality Date    CHOLECYSTECTOMY      COLON SURGERY      partial resection    HERNIA REPAIR      PR CLOSE ENTEROSTOMY,RESEC+COLOREC ANAS Midline 11/01/2019    Procedure: CLO ENTEROSTOMY; W/RESECT COLORECTAL ANASTOM;  Surgeon: Claretta Fraise, MD;  Location: MAIN OR ;  Service: Gastrointestinal    PR COLONOSCOPY FLX DX W/COLLJ SPEC WHEN PFRMD Left 01/07/2013    Procedure: COLONOSCOPY, FLEXIBLE, PROXIMAL TO SPLENIC FLEXURE; DIAGNOSTIC, W/WO COLLECTION SPECIMEN BY BRUSH OR WASH;  Surgeon: Tish Men, MD;  Location: GI [facility];  Service: Gastroenterology    PR COLONOSCOPY FLX DX W/COLLJ SPEC WHEN PFRMD  03/09/2015    Procedure: COLONOSCOPY, FLEXIBLE, PROXIMAL TO SPLENIC FLEXURE; DIAGNOSTIC, W/WO COLLECTION SPECIMEN BY BRUSH OR WASH;  Surgeon: Cletis Athens, MD;  Location: GI [facility]URES [facility];  Service: Gastroenterology    PR COLONOSCOPY FLX DX W/COLLJ SPEC WHEN PFRMD N/A 10/28/2019    Procedure: COLONOSCOPY, FLEXIBLE, PROXIMAL TO SPLENIC FLEXURE; DIAGNOSTIC, W/WO COLLECTION SPECIMEN BY BRUSH OR WASH;  Surgeon: Beverly Milch, MD;  Location: GI PROCEDURES [facility];  Service: Gastroenterology    PR COLONOSCOPY FLX DX W/COLLJ SPEC WHEN PFRMD N/A 10/01/2022    Procedure: COLONOSCOPY, FLEXIBLE, PROXIMAL TO SPLENIC FLEXURE; DIAGNOSTIC, W/WO COLLECTION SPECIMEN BY BRUSH OR WASH;  Surgeon: Zetta Bills, MD;  Location: GI PROCEDURES [facility];  Service: Gastroenterology    PR EXPLORATORY OF ABDOMEN Midline 09/02/2019    Procedure: EXPLORATORY LAPAROTOMY, EXPLORATORY CELIOTOMY WITH OR WITHOUT BIOPSY(S);  Surgeon: Claretta Fraise, MD;  Location: MAIN OR  [facility];  Service: Gastrointestinal    PR EXPLORATORY OF ABDOMEN Midline 11/01/2019    Procedure: EXPLORATORY LAPAROTOMY, EXPLORATORY CELIOTOMY WITH OR WITHOUT BIOPSY(S);  Surgeon: Claretta Fraise, MD;  Location: MAIN OR [facility];  Service: Gastrointestinal    PR FIX FINGER,VOLAR PLATE,I-P JT Left 08/07/2022    Procedure: REPAIR AND RECONSTRUCTION, FINGER, VOLAR PLATE, INTERPHALANGEAL JOINT;  Surgeon: [name], MD;  Location: MAIN OR [facility];  Service: Orthopedics    PR FREEING BOWEL ADHESION,ENTEROLYSIS N/A 11/01/2019    Procedure: Enterolysis (Separt Proc);  Surgeon: Claretta Fraise, MD;  Location: MAIN OR [facility];  Service: Gastrointestinal    PR ILEOSCOPY THRU STOMA,BIOPSY N/A 10/14/2019    Procedure: Karren Cobble; Marquette Saa 1/MX;  Surgeon: Neysa Hotter, MD;  Location: GI [facility]URES [facility];  Service: Gastroenterology    PR PART REMOVAL COLON W COLOSTOMY Midline 09/02/2019    Procedure: COLECTOMY, PARTIAL; WITH SKIN LEVEL CECOSTOMY OR COLOSTOMY;  Surgeon: Claretta Fraise, MD;  Location: MAIN OR [facility];  Service: Gastrointestinal    PR REPAIR INCISIONAL HERNIA,REDUCIBLE Midline 09/02/2019    Procedure: REPAIR INIT INCISIONAL OR VENTRAL HERNIA; REDUCIBLE;  Surgeon: Kristopher Oppenheim, MD;  Location: MAIN OR [place];  Service: Trauma    PR REPAIR INTERCARP/CARP-METACARP JT Left 08/07/2022    Procedure: Jorene Guest;  Surgeon: [name], MD;  Location: MAIN OR [facility];  Service: Orthopedics    PR UPPER GI ENDOSCOPY,BIOPSY N/A 01/07/2013    Procedure: UGI ENDOSCOPY; WITH BIOPSY, SINGLE OR MULTIPLE;  Surgeon: Tish Men, MD;  Location: GI [facility];  Service: Gastroenterology    PR UPPER GI ENDOSCOPY,BIOPSY N/A 10/14/2019    Procedure: UGI ENDOSCOPY; WITH BIOPSY, SINGLE OR MULTIPLE;  Surgeon: Neysa Hotter, MD;  Location: GI PROCEDURES [facility];  Service: Gastroenterology    PR UPPER GI ENDOSCOPY,DIAGNOSIS N/A 10/01/2022    Procedure: UGI ENDO, INCLUDE ESOPHAGUS, STOMACH, & DUODENUM &/OR JEJUNUM; DX W/WO COLLECTION SPECIMN, BY BRUSH OR WASH;  Surgeon: Zetta Bills, MD;  Location: GI [facility];  Service: Gastroenterology       No current facility-administered medications for this encounter.    Current Outpatient Medications:     cholecalciferol, vitamin D3 25 mcg, 1,000 units,, 1,000 unit (25 mcg) tablet, Take 1 tablet (25 mcg total) by mouth daily., Disp: 30 tablet, Rfl: 0    empty container Misc, Use as directed, Disp: 1 each, Rfl: 3    HUMIRA PEN CITRATE FREE 40 MG/0.4 ML, Inject the contents of 1 pen (40 mg total) under the skin every seven (7) days., Disp: 4 each, Rfl: 5    OPTICHAMBER DIAMOND [facility] Spcr, , Disp: , Rfl:     oxyCODONE-acetaminophen (PERCOCET) 5-325 mg per tablet, Take 1 tablet by mouth every eight (8) hours as needed for pain for up to 3 days., Disp: 6 tablet, Rfl: 0    pantoprazole (PROTONIX) 40 MG tablet, Take 1 tablet (40 mg total) by mouth daily., Disp: 90 tablet, Rfl: 0    QUEtiapine (SEROQUEL) 100 MG tablet, Take 1 tablet (100 mg total) by mouth nightly., Disp: , Rfl:     Allergies  Morphine and Toradol [ketorolac]    Family History  Family History   Problem Relation Age of Onset    Breast cancer Mother     Diabetes Maternal Aunt     Colon cancer Maternal Grandmother     Prostate cancer Maternal Grandfather     Cancer Paternal Grandmother     Crohn's disease Neg Hx  Social History  Social History     Tobacco Use    Smoking status: Every Day     Current packs/day: 0.30     Average packs/day: 0.5 packs/day for 21.6 years (10.3 ttl pk-yrs)     Types: Cigarettes     Start date: 10/02/2001     Passive exposure: Past    Smokeless tobacco: Never    Tobacco comments:     Reduced use from 10cpd to 5cpd, though relights each one. Motivated to quit.    Vaping Use    Vaping status: Some Days    Substances: Nicotine    Passive vaping exposure: Yes   Substance Use Topics    Alcohol use: No     Alcohol/week: 0.0 standard drinks of alcohol    Drug use: Yes     Types: Marijuana     Comment: Smokes 2-3 joints/day        Physical Exam     VITAL SIGNS:      Vitals:    05/17/23 1903 05/17/23 1913 05/18/23 0130   BP:  120/83 125/88   Pulse: 91 88 80   Resp:  16 16   Temp:  37.3 ??C (99.1 ??F)    TempSrc:  Oral    SpO2: 95% 97% 98%   Weight:  65.8 kg (145 lb 1 oz)        Constitutional: Alert and oriented.  Moderate distress.  Eyes: Conjunctivae are normal.  HEENT: Normocephalic and atraumatic. Conjunctivae clear. No congestion. Moist mucous membranes.   Cardiovascular: Rate as above, regular rhythm. Normal and symmetric distal pulses. Brisk capillary refill. Normal skin turgor.  Respiratory: Normal respiratory effort. Breath sounds are normal. There are no wheezing or crackles heard.  Gastrointestinal: Exquisite pain to palpation of RLQ with guarding.  However no rebound pain when patient distracted on exam.  Abdomen nondistended.  Reducible ventral abdominal hernia appreciated on exam.  Genitourinary: Deferred.  Musculoskeletal: Non-tender with normal range of motion in all extremities.  Neurologic: Normal speech and language. No gross focal neurologic deficits are appreciated. Patient is moving all extremities equally, face is symmetric at rest and with speech.  Skin: Skin is warm, dry and intact. No rash noted.  Psychiatric: Mood and affect are normal. Speech and behavior are normal.     Radiology     No orders to display       Pertinent labs & imaging results that were available during my care of the patient were independently interpreted by me and considered in my medical decision making (see chart for details).    Portions of this record have been created using Scientist, clinical (histocompatibility and immunogenetics). Dictation errors have been sought, but may not have been identified and corrected.        Maxwell Caul, MD  Resident  05/18/23 [phone]

## 2023-05-18 NOTE — Unmapped (Signed)
Pt BIB [facility], abd pain and nausea. Hx of crohns.

## 2023-05-18 NOTE — Unmapped (Signed)
8/10 abd pain   Hx of crohn's and feel like flair up   Seen about 1 month ago for his flair up as well

## 2023-05-25 NOTE — Unmapped (Signed)
[place] [facility]  Established Visit       Reason for Visit: Inflammatory Bowel Disease  Referring Provider: Jeanann Lewandowsky   Primary Care Provider: Aleen Campi, MD    Assessment & Plan: Jerome Kennedy is a 47 y.o. 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    Currently on humira weekly since Dec 2023, increased from q2 weeks. Prior to this he reportedly failed Remicade (had been following with Dr. Netta Neat at [facility] in [place]). Patient in endoscopic remission with last colonoscopy 09/2022 showing no evidence of active disease with SES-CD 0). However he continues to have recurrent episodes of abdominal pain prompting presentation to multiple medical centers including [facility], [facility] and [facility] with CT scans demonstrating wall thickening of the neo-terminal ileum. Suspect that this finding is chronic rather than representing active disease given presence of similar findings on CTAP Feb 2023 with colonoscopy during that time ([facility]) showing a patent anastomosis with no evidence of active disease. His most recent CT scan in Sept showed similar findings with an indeterminate fecal calprotectin level (64). Altogether, highly suspect that his abdominal pain is largely functional in nature and that he has developed opioid dependence. We will confirm this by repeating endoscopic evaluation (his last colonoscopy 09/2022 had poor bowel prep and neo-TI was unable to be reached). However if his Crohn's disease were to ever progress, we would plan to switch him to Stelara with prednisone taper until it can take effect (which could take up to 8-12 weeks).     Recommendations  - continue weekly Humira   - non-urgent colonoscopy for evaluation of neo-TI given CT findings concerning for wall thickening - needs extended miralax bowel prep   - start miralax 1 capful daily for constipation given Bristol 2 stools  - start scheduled bentyl for abdominal pain, if refractory may need to escalate to TCA however would need better control of his constipation prior to initiation   - we spent the majority of today's visit discussing the importance of minimizing opioids, which are often prescribed to him on ED/hospital discharge - advised pt to call me if recurrent abdominal pain, if I do not have any openings, we can arrange for him to be seen in GI urgent care - the goal would be to keep him out of the ED   - s/p vit B12 injection - continue PO repletion 2000 mcg daily   - continue PO iron   - flu shot today, advised covid vaccine at local pharmacy     Chauncy Passy MD   Gastroenterology Fellow  Pager: [phone]    Patient seen and examined with Dr. Raphael Gibney who is in agreement with above assessment and plan.     Return in about 6 months (around 11/24/2023) for Recheck.         Subjective   HPI: Jerome Westenskow. is a 47 y.o. male with a PMH of PMHx of Crohn's disease of small and large intestine (dx 1990's) s/p ileocecectomy (2003) c/b SBO 2/2 to stricturing disease s/p right hemicolectomy and end-ileostomy (08/2019) complicated by short  gut syndrome and high ostomy output s/p colostomy takedown and reanastomosis (10/2019), ventral abdominal hernia and incisional hernia s/p repair (2004) and recurrent CDI (s/p PO vancomycin 04/2022) that is seen in consultation at the request of Jeanann Lewandowsky for follow-up of Crohn's disease.     Patient was admitted to [facility] Sept 2023 for RLQ pain and diarrhea, initially started on steroids due to concern for CD flare however found to have uncomplicated C diff. His steroids were stopped and he was treated with PO vancomycin QID with resolution. He reports he is now having two formed bowel movements daily with no blood.  He does however admit to abdominal pain, infraumbilical, a few days before his next Humira dose.  He is on Humira q. 14 days reports that abdominal pains tends to start around day 10, and resolves after his Humira dose.  He also reveals he will be leaving the men shelter at [facility] where he is currently staying.  Thinks he will be moving to [facility] in January.  Inquires about IBD providers in [place].    CLINICAL SUMMARY:  - 06/10/22 initial visit, continue humira, check fCal   - 07/2022: inpatient admission for outpatient labs showing hypocalcemia, CT with long segment circumferential bowel wall thickening and hyperenhancement of the neoterminal ileum extending through the anastomosis of the proximal colon which was increased from prior CT scan along with luminal narrowing of this long segment of terminal ileum with focal proximal dilation concerning for stricture. Continue weekly humira (had been increased 1 week prior to hospital admission).   - 09/2022: admitted for RLQ pain and loose stools, EGD normal, colonoscopy with SES CD of 0 though poor prep, continue weekly humira  - 04/2023: admitted for recurrent RLQ pain, thought to be chronic in nature and possibly related to opioid dependent pain disorder. Advised no steroids and continue Humira on discharge    SUBJECTIVE:   Today patient reports 5/10 abdominal pain but he is feeling much better since hospital discharge. He reports his episodes of abdominal pain have been occurring monthly, characterized by sharp RLQ pain and associated loose/green stools (he otherwise has Bristol 2 formed stools when he is asymptomatic), and 4-5 episodes of emesis. Usually sticks to chicken noodle soup during episodes of pain which tend to last 3 weeks. Reports that percocet helps, which was provided on last hospital discharge.     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    Myalgia     Rhinovirus infection     SBO (small bowel obstruction) (CMS-HCC) 2011       Surgical History:  Past Surgical History:   Procedure Laterality Date    CHOLECYSTECTOMY      COLON SURGERY      partial resection    HERNIA REPAIR      PR CLOSE ENTEROSTOMY,RESEC+COLOREC ANAS Midline 11/01/2019    Procedure: CLO ENTEROSTOMY; W/RESECT COLORECTAL ANASTOM;  Surgeon: Zetta Bills  Miachel Roux, MD;  Location: MAIN OR [facility];  Service: Gastrointestinal    PR COLONOSCOPY FLX DX W/COLLJ SPEC WHEN PFRMD Left 01/07/2013    Procedure: COLONOSCOPY, FLEXIBLE, PROXIMAL TO SPLENIC FLEXURE; DIAGNOSTIC, W/WO COLLECTION SPECIMEN BY BRUSH OR WASH;  Surgeon: Tish Men, MD;  Location: GI PROCEDURES [facility];  Service: Gastroenterology    PR COLONOSCOPY FLX DX W/COLLJ SPEC WHEN PFRMD  03/09/2015    Procedure: COLONOSCOPY, FLEXIBLE, PROXIMAL TO SPLENIC FLEXURE; DIAGNOSTIC, W/WO COLLECTION SPECIMEN BY BRUSH OR WASH;  Surgeon: Cletis Athens, MD;  Location: GI PROCEDURES MEM[facility]IAL [facility];  Service: Gastroenterology    PR COLONOSCOPY FLX DX W/COLLJ SPEC WHEN PFRMD N/A 10/28/2019    Procedure: COLONOSCOPY, FLEXIBLE, PROXIMAL TO SPLENIC FLEXURE; DIAGNOSTIC, W/WO COLLECTION SPECIMEN BY BRUSH OR WASH;  Surgeon: Beverly Milch, MD;  Location: GI PROCEDURES MEMOR[facility];  Service: Gastroenterology    PR COLONOSCOPY FLX DX W/COLLJ SPEC WHEN PFRMD N/A 10/01/2022 Procedure: COLONOSCOPY, FLEXIBLE, PROXIMAL TO SPLENIC FLEXURE; DIAGNOSTIC, W/WO COLLECTION SPECIMEN BY BRUSH OR WASH;  Surgeon: Zetta Bills, MD;  Location: GI PROCEDURES MEM[facility];  Service: Gastroenterology    PR EXPLORATORY OF ABDOMEN Midline 09/02/2019    Procedure: EXPLORATORY LAPAROTOMY, EXPLORATORY CELIOTOMY WITH OR WITHOUT BIOPSY(S);  Surgeon: Claretta Fraise, MD;  Location: MAIN OR [facility];  Service: Gastrointestinal    PR EXPLORATORY OF ABDOMEN Midline 11/01/2019    Procedure: EXPLORATORY LAPAROTOMY, EXPLORATORY CELIOTOMY WITH OR WITHOUT BIOPSY(S);  Surgeon: Claretta Fraise, MD;  Location: MAIN OR [facility];  Service: Gastrointestinal    PR FIX FINGER,VOLAR PLATE,I-P JT Left 08/07/2022    Procedure: REPAIR AND RECONSTRUCTION, FINGER, VOLAR PLATE, INTERPHALANGEAL JOINT;  Surgeon: [name], MD;  Location: MAIN OR [facility];  Service: Orthopedics    PR FREEING BOWEL ADHESION,ENTEROLYSIS N/A 11/01/2019    Procedure: Enterolysis (Separt Proc);  Surgeon: Claretta Fraise, MD;  Location: MAIN OR [facility];  Service: Gastrointestinal    PR ILEOSCOPY THRU STOMA,BIOPSY N/A 10/14/2019    Procedure: Karren Cobble; [place] Saa 1/MX;  Surgeon: Neysa Hotter, MD;  Location: GI PROCEDURES [facility];  Service: Gastroenterology    PR PART REMOVAL COLON W COLOSTOMY Midline 09/02/2019    Procedure: COLECTOMY, PARTIAL; WITH SKIN LEVEL CECOSTOMY OR COLOSTOMY;  Surgeon: Claretta Fraise, MD;  Location: MAIN OR [facility];  Service: Gastrointestinal    PR REPAIR INCISIONAL HERNIA,REDUCIBLE Midline 09/02/2019    Procedure: REPAIR INIT INCISIONAL OR VENTRAL HERNIA; REDUCIBLE;  Surgeon: Kristopher Oppenheim, MD;  Location: MAIN OR [place];  Service: Trauma    PR REPAIR INTERCARP/CARP-METACARP JT Left 08/07/2022    Procedure: Jorene Guest;  Surgeon: [name], MD;  Location: MAIN OR [facility];  Service: Orthopedics    PR UPPER GI ENDOSCOPY,BIOPSY N/A 01/07/2013    Procedure: UGI ENDOSCOPY; WITH BIOPSY, SINGLE OR MULTIPLE;  Surgeon: Tish Men, MD;  Location: GI PROCEDURES [facility];  Service: Gastroenterology    PR UPPER GI ENDOSCOPY,BIOPSY N/A 10/14/2019    Procedure: UGI ENDOSCOPY; WITH BIOPSY, SINGLE OR MULTIPLE;  Surgeon: Neysa Hotter, MD;  Location: GI PROCEDURES [facility];  Service: Gastroenterology    PR UPPER GI ENDOSCOPY,DIAGNOSIS N/A 10/01/2022    Procedure: UGI ENDO, INCLUDE ESOPHAGUS, STOMACH, & DUODENUM &/OR JEJUNUM; DX W/WO COLLECTION SPECIMN, BY BRUSH OR WASH;  Surgeon: Zetta Bills, MD;  Location: GI PROCEDURES MEMOR[facility];  Service: Gastroenterology       Family History:  Family History   Problem Relation Age of Onset    Breast cancer Mother     Diabetes Maternal  Aunt     Colon cancer Maternal Grandmother     Prostate cancer Maternal Grandfather     Cancer Paternal Grandmother     Crohn's disease Neg Hx        Social History:  Social History     Tobacco Use    Smoking status: Every Day     Current packs/day: 0.30     Average packs/day: 0.5 packs/day for 21.6 years (10.3 ttl pk-yrs)     Types: Cigarettes     Start date: 10/02/2001     Passive exposure: Current    Smokeless tobacco: Never    Tobacco comments:     Reduced use from 10cpd to 5cpd, though relights each one. Motivated to quit.    Vaping Use    Vaping status: Some Days    Substances: Nicotine    Passive vaping exposure: Yes   Substance Use Topics    Alcohol use: No     Alcohol/week: 0.0 standard drinks of alcohol    Drug use: Yes     Types: Marijuana     Comment: Smokes 2-3 joints/day       Medications:   Prior to Admission medications    Medication Dose, Route, Frequency   cholecalciferol, vitamin D3 25 mcg, 1,000 units,, 1,000 unit (25 mcg) tablet 25 mcg, Oral, Daily (standard)   empty container Misc Use as directed   HUMIRA PEN CITRATE FREE 40 MG/0.4 ML Inject the contents of 1 pen (40 mg total) under the skin every seven (7) days.   OPTICHAMBER DIAMOND V[facility] Spcr    pantoprazole (PROTONIX) 40 MG tablet 40 mg, Oral, Daily (standard)   QUEtiapine (SEROQUEL) 100 MG tablet Take 1 tablet (100 mg total) by mouth nightly.   dicyclomine (BENTYL) 10 mg capsule 10 mg, Oral, 4 times a day (ACHS)   polyethylene glycol (MIRALAX) 17 gram packet 17 g, Oral, Daily (standard)       Allergies:  Morphine and Toradol [ketorolac]    Review of Systems:  10 systems were reviewed and are negative unless otherwise mentioned in the HPI            Objective   Vital Signs:  Vitals:    05/26/23 1218   BP: 115/73   Pulse: 78   Resp: 17   Temp: 36.3 ??C (97.3 ??F)   SpO2: 100%   Weight: 69 kg (152 lb 3.2 oz)   Height: 172.7 cm (5' 7.99)        Body mass index is 23.15 kg/m??.    Wt Readings from Last 12 Encounters:   05/26/23 69 kg (152 lb 3.2 oz)   05/17/23 65.8 kg (145 lb 1 oz)   04/26/23 65.8 kg (145 lb)   03/30/23 65.8 kg (145 lb)   01/28/23 64.6 kg (142 lb 6.4 oz)   01/25/23 64.9 kg (143 lb)   11/13/22 63 kg (138 lb 12.8 oz)   10/28/22 63.5 kg (140 lb)   10/17/22 63.2 kg (139 lb 4.8 oz)   10/12/22 70.3 kg (155 lb)   10/01/22 68 kg (150 lb)   09/17/22 61.2 kg (135 lb)       Physical Exam:   Gen: NAD, answers questions appropriately  Abdomen: abdominal hernia, reducible,   Extremities: no c/c/e, pulses +2 in b/l UE/LEs  Neuro: CN II-XI grossly intact, normal cerebellar function, normal gait. No focal deficits.  Skin:  No rashes, lesions on clothed exam  Psych: Alert and oriented, normal mood and affect.  GI Imaging and Procedures:     EGD/Colonoscopy 10/01/22:   Findings:       A small hiatal hernia was present.       The exam of the esophagus was otherwise normal.       The entire examined stomach was normal.       The examined duodenum was normal.       There was no evidence of Crohn's disease seen on the EGD.                                                                                   Impression:            - Small hiatal hernia.                         - Normal stomach.                         - Normal examined duodenum. - No specimens collected.    Findings:       A large amount of semi-liquid semi-solid stool was found in the entire        colon, precluding visualization. This made the endoscopy challenging and        the procedure had to be aborted because the ileocolonic anastomosis was        reached.       The Simple Endoscopic Score for Crohn's Disease was determined based on        the endoscopic appearance of the mucosa in the following segments:        Segment score: 0. Segment score: 0. Segment score: 0.       - Left Colon: Based on visualized mucosa, findings include no ulcers        present, no ulcerated surfaces, no affected surfaces and no narrowings.        Segment score: 0.       - Rectum: Based on visualized mucos, findings include no ulcers present,        no ulcerated surfaces, no affected surfaces and no narrowings. Segment        score: 0.       - Total SES-CD aggregate score: 0.                                                                                   Impression:            - Stool in the entire examined colon.                         - Simple Endoscopic Score for Crohn's Disease: 0,  mucosal inflammatory changes secondary to Crohn's                          disease, in remission.                         - No specimens collected.    Colonoscopy 09/06/21:

## 2023-05-26 ENCOUNTER — Ambulatory Visit: Admit: 2023-05-26 | Discharge: 2023-05-27 | Payer: PRIVATE HEALTH INSURANCE

## 2023-05-26 DIAGNOSIS — K50018 Crohn's disease of small intestine with other complication: Principal | ICD-10-CM

## 2023-05-26 MED ORDER — POLYETHYLENE GLYCOL 3350 17 GRAM ORAL POWDER PACKET
PACK | Freq: Every day | ORAL | 3 refills | 90 days | Status: CP
Start: 2023-05-26 — End: ?
  Filled 2023-05-28: qty 30, 30d supply, fill #0

## 2023-05-26 MED ORDER — DICYCLOMINE 10 MG CAPSULE
ORAL_CAPSULE | Freq: Four times a day (QID) | ORAL | 3 refills | 30 days | Status: CP
Start: 2023-05-26 — End: 2024-05-25
  Filled 2023-05-28: qty 120, 30d supply, fill #0

## 2023-05-26 NOTE — Unmapped (Signed)
[facility] Refill Coordination Note    Specialty Medication(s) to be Shipped:   Inflammatory Disorders: Humira    Other medication(s) to be shipped:  dicyclomine and clearlax     Jerome Kennedy., DOB: [birth year]  Phone: [phone] (home)       All above HIPAA information was verified with patient.     Was a Nurse, learning disability used for this call? No    Completed refill call assessment today to schedule patient's medication shipment from the [facility]  [phone]).  All relevant notes have been reviewed.     Specialty medication(s) and dose(s) confirmed: Regimen is correct and unchanged.   Changes to medications: Myrel reports no changes at this time.  Changes to insurance: No  New side effects reported not previously addressed with a pharmacist or physician: None reported  Questions for the pharmacist: No    Confirmed patient received a Conservation officer, historic buildings and a Surveyor, mining with first shipment. The patient will receive a drug information handout for each medication shipped and additional FDA Medication Guides as required.       DISEASE/MEDICATION-SPECIFIC INFORMATION        For patients on injectable medications: Patient currently has 0 doses left.  Next injection is scheduled for 05/30/23.    SPECIALTY MEDICATION ADHERENCE     Medication Adherence    Patient reported X missed doses in the last month: 0  Specialty Medication: HUMIRA(CF) PEN 40 mg/0.4 mL  Patient is on additional specialty medications: No              Were doses missed due to medication being on hold? No    humira 40/0.4 mg/ml: 0 doses of medicine on hand       REFERRAL TO PHARMACIST     Referral to the pharmacist: Not needed      [facility]     Shipping address confirmed in Epic.       Delivery Scheduled: Yes, Expected medication delivery date: 05/28/23.     Medication will be delivered via Same Day Courier to the prescription address in Epic WAM.    Quintella Reichert   [facility]  Specialty Technician

## 2023-05-26 NOTE — Unmapped (Signed)
The [facility] has made a second and final attempt to reach this patient to refill the following medication: Humira.      We have left voicemails on the following phone numbers: [phone] and have sent a text message to the following phone numbers: [phone] .    Dates contacted: 10/16 & 10/22  Last scheduled delivery: 04/28/23 (28 day supply)    The patient may be at risk of non-compliance with this medication. The patient should call the [facility] at [phone]  Option 4, then Option 2: Dermatology, Gastroenterology, Rheumatology to refill medication.    [name]   [facility] Specialty Technician

## 2023-05-26 NOTE — Unmapped (Addendum)
Start miralax 1 packet daily, dissolved in water, to have regular bowel movements.   Start bentyl 4 times a day for abdominal pain. If this does not help, we can try other medications for the pain.   Minimize percocet and other opioids.  We will call you to schedule your colonoscopy.   We will call you to schedule your next appointment.

## 2023-05-28 MED FILL — HUMIRA PEN CITRATE FREE 40 MG/0.4 ML: SUBCUTANEOUS | 28 days supply | Qty: 4 | Fill #5

## 2023-06-03 DIAGNOSIS — R1031 Right lower quadrant pain: Principal | ICD-10-CM

## 2023-06-03 LAB — COMPREHENSIVE METABOLIC PANEL
ALBUMIN: 3.4 g/dL (ref 3.4–5.0)
ALKALINE PHOSPHATASE: 139 U/L — ABNORMAL HIGH (ref 46–116)
ALT (SGPT): 11 U/L (ref 10–49)
ANION GAP: 10 mmol/L (ref 5–14)
BILIRUBIN TOTAL: 0.4 mg/dL (ref 0.3–1.2)
BLOOD UREA NITROGEN: 9 mg/dL (ref 9–23)
BUN / CREAT RATIO: 8
CALCIUM: 9.4 mg/dL (ref 8.7–10.4)
CHLORIDE: 112 mmol/L — ABNORMAL HIGH (ref 98–107)
CO2: 16 mmol/L — ABNORMAL LOW (ref 20.0–31.0)
CREATININE: 1.15 mg/dL
EGFR CKD-EPI (2021) MALE: 79 mL/min/{1.73_m2} (ref >=60)
GLUCOSE RANDOM: 96 mg/dL (ref 70–179)
PROTEIN TOTAL: 6.8 g/dL (ref 5.7–8.2)
SODIUM: 138 mmol/L (ref 135–145)

## 2023-06-03 LAB — POTASSIUM: POTASSIUM: 3.8 mmol/L (ref 3.4–4.8)

## 2023-06-03 LAB — LIPASE: LIPASE: 50 U/L (ref 12–53)

## 2023-06-03 LAB — CBC W/ AUTO DIFF
BASOPHILS ABSOLUTE COUNT: 0.1 10*9/L (ref 0.0–0.1)
BASOPHILS RELATIVE PERCENT: 1.7 %
EOSINOPHILS ABSOLUTE COUNT: 0.1 10*9/L (ref 0.0–0.5)
EOSINOPHILS RELATIVE PERCENT: 2.3 %
HEMATOCRIT: 44.5 % (ref 39.0–48.0)
HEMOGLOBIN: 14.8 g/dL (ref 12.9–16.5)
LYMPHOCYTES ABSOLUTE COUNT: 2.5 10*9/L (ref 1.1–3.6)
LYMPHOCYTES RELATIVE PERCENT: 38.7 %
MEAN CORPUSCULAR HEMOGLOBIN CONC: 33.3 g/dL (ref 32.0–36.0)
MEAN CORPUSCULAR HEMOGLOBIN: 28.5 pg (ref 25.9–32.4)
MEAN CORPUSCULAR VOLUME: 85.6 fL (ref 77.6–95.7)
MONOCYTES ABSOLUTE COUNT: 0.5 10*9/L (ref 0.3–0.8)
MONOCYTES RELATIVE PERCENT: 8.2 %
NEUTROPHILS ABSOLUTE COUNT: 3.2 10*9/L (ref 1.8–7.8)
NEUTROPHILS RELATIVE PERCENT: 49.1 %
PLATELET COUNT: >215 10*9/L (ref 150–450)
RED BLOOD CELL COUNT: 5.2 10*12/L (ref 4.26–5.60)
RED CELL DISTRIBUTION WIDTH: 14.8 % (ref 12.2–15.2)
WBC ADJUSTED: 6.5 10*9/L (ref 3.6–11.2)

## 2023-06-03 LAB — AST: AST (SGOT): 14 U/L (ref <=34)

## 2023-06-03 LAB — C-REACTIVE PROTEIN: C-REACTIVE PROTEIN: 16 mg/L — ABNORMAL HIGH (ref <=10.0)

## 2023-06-03 MED ORDER — OXYCODONE 5 MG TABLET
ORAL_TABLET | Freq: Four times a day (QID) | ORAL | 0 refills | 2 days | Status: CP | PRN
Start: 2023-06-03 — End: 2023-06-05
  Filled 2023-06-15: qty 8, 2d supply, fill #0

## 2023-06-04 ENCOUNTER — Ambulatory Visit
Admit: 2023-06-04 | Discharge: 2023-06-04 | Disposition: A | Discharge status: Home / Self Care | Payer: PRIVATE HEALTH INSURANCE

## 2023-06-04 MED ADMIN — diphenhydrAMINE (BENADRYL) capsule/tablet 25 mg: 25 mg | ORAL | @ 02:00:00 | Stop: 2023-06-03

## 2023-06-04 MED ADMIN — oxyCODONE (ROXICODONE) immediate release tablet 5 mg: 5 mg | ORAL | @ 03:00:00 | Stop: 2023-06-03

## 2023-06-04 MED ADMIN — acetaminophen (TYLENOL) tablet 650 mg: 650 mg | ORAL | @ 02:00:00 | Stop: 2023-06-03

## 2023-06-04 MED ADMIN — dicyclomine (BENTYL) capsule 20 mg: 20 mg | ORAL | @ 02:00:00 | Stop: 2023-06-03

## 2023-06-04 MED ADMIN — prochlorperazine (COMPAZINE) tablet 5 mg: 5 mg | ORAL | @ 02:00:00 | Stop: 2023-06-03

## 2023-06-04 NOTE — Unmapped (Signed)
ED Progress Note    [facility]  Emergency Department Medical Screening Examination     Subjective     Jerome Kennedy. is a 47 y.o. male presenting for evaluation of No chief complaint on file.Marland Kitchen  Hx of Crohn's with multiple surgeries, seen recently by GI with concern for functional abdominal pain with opoid dependence.      Abbreviated Review of Systems  Constitutional: Negative for fever  Cardiovascular: Negative for chest pain.  Respiratory: Negative for cough. Negative for difficulty breathing.    Objective     Vitals:    06/03/23 2035   BP: 103/78   Pulse: 107   Resp: 18   Temp: 36.7 ??C (98.1 ??F)   SpO2: 93%       Focused Physical Exam  Constitutional: No acute distress.  Respiratory: Non-labored respirations.  Neurological: Clear speech. No gross focal neurologic deficits are appreciated.  ?  Assessment & Plan     Basic labs with ESR/CRP ordered. Given 4 CT scans in past 3 months, with similar presentation today, will hold off on imaging.     The remainder of the assessment and management of the patient will be deferred to the ED provider who performs a comprehensive evaluation once an appropriate treatment space becomes available. They will follow-up of any testing ordered from triage.    A medical screening exam has been performed. At the time of this evaluation, no emergency medical condition requiring immediate stabilization has been identified nor is there suspicion for imminent decompensation. Appropriate triage protocols will be implemented and a comprehensive ED evaluation with disposition will be completed by a healthcare provider when an appropriate treatment space becomes available. The patient has been made aware that this is an initial encounter only and verbalizes understanding and agreement with the plan.     Evaristo Bury, FNP  June 03, 2023 8:34 PM

## 2023-06-04 NOTE — Unmapped (Signed)
[facility]  Emergency Department Provider Note       ED Clinical Impression     Final diagnoses:   Right lower quadrant abdominal pain (Primary)        History     Chief Complaint  Chief Complaint   Patient presents with    Abdominal Pain       HPI   Jerome Beigel. is a 47 y.o. male with a history as below who is presenting with abdominal pain.  Patient with numerous admissions and ED presentations over the past year with abdominal pain.  Patient has been compliant with Humira and GI follow-up.  Numerous CT scans and multiple medical centers demonstrating stable wall thickening of Neo-terminal ileum. Per recent GI note, concern for opioid dependence and high suspicion for chronic image findings with pain functional in nature.  Denies fever/chills, chest pain/shortness of breath, dysuria, bloody BM.  Reports diarrhea, emesis yesterday followed by abdominal pain.  Received fentanyl and Zofran prior to arrival.     Impression, Medical Decision Making, ED Course     Impression: This is a 47 y.o. male with a history of numerous abdominal surgeries, longstanding Crohn's disease compliant with Humira who presents with abdominal pain. Upon initial evaluation in the emergency department, the patient was non-toxic appearing with reassuring vitals as below.    BP 103/78  - Pulse 107 Comment: Simultaneous filing. User may not have seen previous data. - Temp 36.7 ??C (98.1 ??F) (Oral)  - Resp 18  - Wt 68.9 kg (152 lb)  - SpO2 93%  - BMI 23.12 kg/m??     Diagnostic workup as below.  Patient offered CT abdomen pelvis but declined due to numerous recent scans and concern for radiation exposure.      Orders Placed This Encounter   Procedures    CBC w/ Differential    Comprehensive metabolic panel    Sedimentation rate, manual    C-reactive protein    Lipase    Potassium Level    AST       ED Course as of 06/03/23 2303   Wed Jun 03, 2023   2122 On initial assessment, patient is in no acute distress complaining of abdominal pain and nausea.  Rebound tenderness with distraction absent.  Tender to deep palpation in right lower quadrant.   2302 Pain improved with p.o. Benadryl, Compazine, Tylenol, Bentyl.  Discharge pending p.o. challenge.       ____________________________________________    The case was discussed with Dr. Sabino Gasser, MD, who is in agreement with the above assessment and plan.    Dictation software was used while making this note. Please excuse any errors made with dictation software.     Additional Medical Decision Making     I have reviewed the vital signs and the nursing notes. Labs and radiology results that were available during my care of the patient were independently reviewed by me and considered in my medical decision making.     I independently visualized the EKG tracing if performed.  I independently visualized the radiology images if performed.  I reviewed the patient's prior medical records if available.  Additional history obtained from family if available.  I discussed the case with the admitting provider and the consulting services if the patient was admitted and/or consulting services were utilized.     Physical Exam     VITAL SIGNS:      Vitals:    06/03/23 2035   BP: 103/78  Pulse: 107   Resp: 18   Temp: 36.7 ??C (98.1 ??F)   TempSrc: Oral   SpO2: 93%   Weight: 68.9 kg (152 lb)       Physical Exam   Constitutional: No distress. He appears chronically ill.   HENT: Mouth/Throat: Oropharynx is clear.   Eyes: Pupils are equal, round, and reactive to light.   Cardiovascular: Normal rate and regular rhythm.   Pulmonary/Chest: Effort normal and breath sounds normal.   Abdominal: Soft. Bowel sounds are normal. There is abdominal tenderness.   Tender to deep palpation in the right lower quadrant.  Rebound tenderness absent under distraction.   Musculoskeletal:         General: Normal range of motion.      Cervical back: Normal range of motion.   Neurological: He is alert and oriented to person, place, and time. He has normal motor skills.   Skin: Skin is warm and dry.        History     Chief Complaint  Chief Complaint   Patient presents with    Abdominal Pain       HPI   See above     All other systems have been reviewed and are negative except as otherwise documented.    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    Myalgia     Rhinovirus infection     SBO (small bowel obstruction) (CMS-HCC) 2011       Past Surgical History:   Procedure Laterality Date    CHOLECYSTECTOMY      COLON SURGERY      partial resection    HERNIA REPAIR      PR CLOSE ENTEROSTOMY,RESEC+COLOREC ANAS Midline 11/01/2019    Procedure: CLO ENTEROSTOMY; W/RESECT COLORECTAL ANASTOM;  Surgeon: Claretta Fraise, MD;  Location: MAIN OR [place];  Service: Gastrointestinal    PR COLONOSCOPY FLX DX W/COLLJ SPEC WHEN PFRMD Left 01/07/2013    Procedure: COLONOSCOPY, FLEXIBLE, PROXIMAL TO SPLENIC FLEXURE; DIAGNOSTIC, W/WO COLLECTION SPECIMEN BY BRUSH OR WASH;  Surgeon: Tish Men, MD;  [facility];  Service: Gastroenterology    PR COLONOSCOPY FLX DX W/COLLJ SPEC WHEN PFRMD  03/09/2015    Procedure: COLONOSCOPY, FLEXIBLE, PROXIMAL TO SPLENIC FLEXURE; DIAGNOSTIC, W/WO COLLECTION SPECIMEN BY BRUSH OR WASH;  Surgeon: Cletis Athens, MD;  Location: [facility];  Service: Gastroenterology    PR COLONOSCOPY FLX DX W/COLLJ SPEC WHEN PFRMD N/A 10/28/2019    Procedure: COLONOSCOPY, FLEXIBLE, PROXIMAL TO SPLENIC FLEXURE; DIAGNOSTIC, W/WO COLLECTION SPECIMEN BY BRUSH OR WASH; Surgeon: Beverly Milch, MD;  Location: [facility]URES [facility];  Service: Gastroenterology    PR COLONOSCOPY FLX DX W/COLLJ SPEC WHEN PFRMD N/A 10/01/2022    Procedure: COLONOSCOPY, FLEXIBLE, PROXIMAL TO SPLENIC FLEXURE; DIAGNOSTIC, W/WO COLLECTION SPECIMEN BY BRUSH OR WASH;  Surgeon: Zetta Bills, MD;  Location: [facility];  Service: Gastroenterology    PR EXPLORATORY OF ABDOMEN  Midline 09/02/2019    Procedure: EXPLORATORY LAPAROTOMY, EXPLORATORY CELIOTOMY WITH OR WITHOUT BIOPSY(S);  Surgeon: Claretta Fraise, MD;  Location: MAIN OR [facility]  Service: Gastrointestinal    PR EXPLORATORY OF ABDOMEN Midline 11/01/2019    Procedure: EXPLORATORY LAPAROTOMY, EXPLORATORY CELIOTOMY WITH OR WITHOUT BIOPSY(S);  Surgeon: Claretta Fraise, MD;  Location: MAIN OR [facility];  Service: Gastrointestinal    PR FIX FINGER,VOLAR PLATE,I-P JT Left 08/07/2022    Procedure: REPAIR AND RECONSTRUCTION, FINGER, VOLAR PLATE, INTERPHALANGEAL JOINT;  Surgeon: [name], MD;  Location: MAIN OR [facility];  Service: Orthopedics    PR FREEING BOWEL ADHESION,ENTEROLYSIS N/A 11/01/2019    Procedure: Enterolysis (Separt Proc);  Surgeon: Claretta Fraise, MD;  Location: MAIN OR [facility];  Service: Gastrointestinal    PR ILEOSCOPY THRU STOMA,BIOPSY N/A 10/14/2019    Procedure: Karren Cobble; [place] Saa 1/MX;  Surgeon: Neysa Hotter, MD;  Location: [facility];  Service: Gastroenterology    PR PART REMOVAL COLON W COLOSTOMY Midline 09/02/2019    Procedure: COLECTOMY, PARTIAL; WITH SKIN LEVEL CECOSTOMY OR COLOSTOMY;  Surgeon: Claretta Fraise, MD;  Location: MAIN OR [facility];  Service: Gastrointestinal    PR REPAIR INCISIONAL HERNIA,REDUCIBLE Midline 09/02/2019    Procedure: REPAIR INIT INCISIONAL OR VENTRAL HERNIA; REDUCIBLE;  Surgeon: Kristopher Oppenheim, MD;  Location: MAIN OR [place];  Service: Trauma    PR REPAIR INTERCARP/CARP-METACARP JT Left 08/07/2022    Procedure: Jorene Guest; Surgeon: [name], MD;  Location: MAIN OR [facility];  Service: Orthopedics    PR UPPER GI ENDOSCOPY,BIOPSY N/A 01/07/2013    Procedure: UGI ENDOSCOPY; WITH BIOPSY, SINGLE OR MULTIPLE;  Surgeon: Tish Men, MD;  Location: [facility];  Service: Gastroenterology    PR UPPER GI ENDOSCOPY,BIOPSY N/A 10/14/2019    Procedure: UGI ENDOSCOPY; WITH BIOPSY, SINGLE OR MULTIPLE;  Surgeon: Neysa Hotter, MD;  Location: [facility];  Service: Gastroenterology    PR UPPER GI ENDOSCOPY,DIAGNOSIS N/A 10/01/2022    Procedure: UGI ENDO, INCLUDE ESOPHAGUS, STOMACH, & DUODENUM &/OR JEJUNUM; DX W/WO COLLECTION SPECIMN, BY BRUSH OR WASH;  Surgeon: Zetta Bills, MD;  Location: [facility];  Service: Gastroenterology       No current facility-administered medications for this encounter.    Current Outpatient Medications:     cholecalciferol, vitamin D3 25 mcg, 1,000 units,, 1,000 unit (25 mcg) tablet, Take 1 tablet (25 mcg total) by mouth daily., Disp: 30 tablet, Rfl: 0    dicyclomine (BENTYL) 10 mg capsule, Take 1 capsule (10 mg total) by mouth Four (4) times a day (before meals and nightly)., Disp: 120 capsule, Rfl: 3    empty container Misc, Use as directed, Disp: 1 each, Rfl: 3    HUMIRA PEN CITRATE FREE 40 MG/0.4 ML, Inject the contents of 1 pen (40 mg total) under the skin every seven (7) days., Disp: 4 each, Rfl: 5    OPTICHAMBER DIAMOND [facility] Spcr, , Disp: , Rfl:     pantoprazole (PROTONIX) 40 MG tablet, Take 1 tablet (40 mg total) by mouth daily., Disp: 90 tablet, Rfl: 0    polyethylene glycol (MIRALAX) 17 gram packet, Take 17 g (1 packet in 4-8 oz of liquid)  by mouth daily., Disp: 90 packet, Rfl: 3    QUEtiapine (SEROQUEL) 100 MG tablet, Take 1 tablet (100 mg total) by mouth nightly., Disp: , Rfl:     Allergies  Morphine and Toradol [ketorolac]    Family History   Problem Relation Age of  Onset    Breast cancer Mother     Diabetes Maternal Aunt     Colon cancer Maternal Grandmother     Prostate cancer Maternal Grandfather     Cancer Paternal Grandmother     Crohn's disease Neg Hx        Social History  Social History     Tobacco Use    Smoking status: Every Day     Current packs/day: 0.30     Average packs/day: 0.5 packs/day for 21.7 years (10.3 ttl pk-yrs)     Types: Cigarettes     Start date: 10/02/2001     Passive exposure: Current    Smokeless tobacco: Never    Tobacco comments:     Reduced use from 10cpd to 5cpd, though relights each one. Motivated to quit.    Vaping Use    Vaping status: Some Days    Substances: Nicotine    Passive vaping exposure: Yes   Substance Use Topics    Alcohol use: No     Alcohol/week: 0.0 standard drinks of alcohol    Drug use: Yes     Types: Marijuana     Comment: Smokes 2-3 joints/day        Radiology     No orders to display        Laboratory Data     Lab Results   Component Value Date    WBC 6.5 06/03/2023    HGB 14.8 06/03/2023    HCT 44.5 06/03/2023    PLT >215 06/03/2023       Lab Results   Component Value Date    NA 138 06/03/2023    K 3.8 06/03/2023    CL 112 (H) 06/03/2023    CO2 16.0 (L) 06/03/2023    BUN 9 06/03/2023    CREATININE 1.15 06/03/2023    GLU 96 06/03/2023    CALCIUM 9.4 06/03/2023    MG 1.6 04/27/2023    PHOS 2.7 12/17/2020       Lab Results   Component Value Date    BILITOT 0.4 06/03/2023    BILIDIR 0.20 04/26/2023    PROT 6.8 06/03/2023    ALBUMIN 3.4 06/03/2023    ALT 11 06/03/2023    AST 14 06/03/2023    ALKPHOS 139 (H) 06/03/2023    GGT 57 01/05/2013       Lab Results   Component Value Date    LABPROT 12.0 01/08/2013    INR 1.08 02/22/2023    APTT 30.0 10/21/2019       Pertinent labs & imaging results that were available during my care of the patient were reviewed by me and considered in my medical decision making (see chart for details).    Portions of this record have been created using Scientist, clinical (histocompatibility and immunogenetics). Dictation errors have been sought, but may not have been identified and corrected.       Berlin Hun, MD  Resident  06/03/23 [phone]

## 2023-06-04 NOTE — Unmapped (Addendum)
Pt BIB EMS for abdominal pain X 1 hour. Hx Crohns, reports it feels like a flare up. Pt seen 1 month ago for same. Pt received fentanyl and 4mg  zofran via EMS. Pain from 10-8 after fentanyl. VSS. Vomiting X 2 PTA, improved after zofran.

## 2023-06-04 NOTE — Unmapped (Signed)
Patient in via [facility] for abdominal pain that is possibly related to the patient's crohn's. Given zofran and fentanyl by EMS.

## 2023-06-15 ENCOUNTER — Ambulatory Visit
Admit: 2023-06-15 | Discharge: 2023-06-15 | Disposition: A | Discharge status: Home / Self Care | Payer: PRIVATE HEALTH INSURANCE

## 2023-06-15 DIAGNOSIS — R109 Unspecified abdominal pain: Principal | ICD-10-CM

## 2023-06-15 DIAGNOSIS — K5090 Crohn's disease, unspecified, without complications: Principal | ICD-10-CM

## 2023-06-15 LAB — CBC W/ AUTO DIFF
BASOPHILS ABSOLUTE COUNT: 0.1 10*9/L (ref 0.0–0.1)
BASOPHILS RELATIVE PERCENT: 1.1 %
EOSINOPHILS ABSOLUTE COUNT: 0.2 10*9/L (ref 0.0–0.5)
EOSINOPHILS RELATIVE PERCENT: 3.6 %
HEMATOCRIT: 38.1 % — ABNORMAL LOW (ref 39.0–48.0)
HEMOGLOBIN: 12.6 g/dL — ABNORMAL LOW (ref 12.9–16.5)
LYMPHOCYTES ABSOLUTE COUNT: 1.9 10*9/L (ref 1.1–3.6)
LYMPHOCYTES RELATIVE PERCENT: 37 %
MEAN CORPUSCULAR HEMOGLOBIN CONC: 33 g/dL (ref 32.0–36.0)
MEAN CORPUSCULAR HEMOGLOBIN: 27.8 pg (ref 25.9–32.4)
MEAN CORPUSCULAR VOLUME: 84.3 fL (ref 77.6–95.7)
MEAN PLATELET VOLUME: 8.3 fL (ref 6.8–10.7)
MONOCYTES ABSOLUTE COUNT: 0.3 10*9/L (ref 0.3–0.8)
MONOCYTES RELATIVE PERCENT: 5.6 %
NEUTROPHILS ABSOLUTE COUNT: 2.6 10*9/L (ref 1.8–7.8)
NEUTROPHILS RELATIVE PERCENT: 52.7 %
PLATELET COUNT: 197 10*9/L (ref 150–450)
RED BLOOD CELL COUNT: 4.52 10*12/L (ref 4.26–5.60)
RED CELL DISTRIBUTION WIDTH: 14.3 % (ref 12.2–15.2)
WBC ADJUSTED: 5 10*9/L (ref 3.6–11.2)

## 2023-06-15 LAB — COMPREHENSIVE METABOLIC PANEL
ALBUMIN: 2.9 g/dL — ABNORMAL LOW (ref 3.4–5.0)
ALKALINE PHOSPHATASE: 119 U/L — ABNORMAL HIGH (ref 46–116)
ALT (SGPT): <7 U/L — ABNORMAL LOW (ref 10–49)
ANION GAP: 3 mmol/L — ABNORMAL LOW (ref 5–14)
AST (SGOT): 13 U/L (ref <=34)
BILIRUBIN TOTAL: 0.5 mg/dL (ref 0.3–1.2)
BLOOD UREA NITROGEN: 6 mg/dL — ABNORMAL LOW (ref 9–23)
BUN / CREAT RATIO: 7
CALCIUM: 8.3 mg/dL — ABNORMAL LOW (ref 8.7–10.4)
CHLORIDE: 118 mmol/L — ABNORMAL HIGH (ref 98–107)
CO2: 24 mmol/L (ref 20.0–31.0)
CREATININE: 0.91 mg/dL
EGFR CKD-EPI (2021) MALE: >90 mL/min/{1.73_m2} (ref >=60)
GLUCOSE RANDOM: 80 mg/dL (ref 70–179)
POTASSIUM: 3.6 mmol/L (ref 3.4–4.8)
PROTEIN TOTAL: 5.8 g/dL (ref 5.7–8.2)
SODIUM: 145 mmol/L (ref 135–145)

## 2023-06-15 LAB — SEDIMENTATION RATE: ERYTHROCYTE SEDIMENTATION RATE: 6 mm/h (ref 0–15)

## 2023-06-15 LAB — C-REACTIVE PROTEIN: C-REACTIVE PROTEIN: 7 mg/L (ref <=10.0)

## 2023-06-15 LAB — LIPASE: LIPASE: 34 U/L (ref 12–53)

## 2023-06-15 MED ADMIN — oxyCODONE (ROXICODONE) immediate release tablet 5 mg: 5 mg | ORAL | @ 17:00:00 | Stop: 2023-06-15

## 2023-06-15 MED ADMIN — dicyclomine (BENTYL) capsule 20 mg: 20 mg | ORAL | @ 17:00:00 | Stop: 2023-06-15

## 2023-06-15 MED ADMIN — oxyCODONE (ROXICODONE) immediate release tablet 5 mg: 5 mg | ORAL | @ 20:00:00 | Stop: 2023-06-15

## 2023-06-15 MED ADMIN — promethazine (PHENERGAN) 12.5 mg in sodium chloride (NS) 0.9 % 50 mL IVPB: 12.5 mg | INTRAVENOUS | @ 20:00:00 | Stop: 2023-06-15

## 2023-06-15 MED ADMIN — acetaminophen (TYLENOL) tablet 650 mg: 650 mg | ORAL | @ 17:00:00 | Stop: 2023-06-15

## 2023-06-15 NOTE — Unmapped (Signed)
[facility]  Emergency Department Provider Note       ED Clinical Impression     Final diagnoses:   Right sided abdominal pain (Primary)   Crohn's disease without complication, unspecified gastrointestinal tract location (CMS-HCC)        History     Chief Complaint  Chief Complaint   Patient presents with    Abdominal Pain       HPI   Jerome Broas. is a 47 y.o. male with a history as below who is presenting with abdominal pain.  Patient presenting with 1 day history of right-sided abdominal pain associated with multiple episodes of nonbloody emesis.  Patient reports he has been having intermittent symptoms similar to this episode over the past few months.  He has been having diarrhea which is his baseline and is nonbloody.  Congestion but no fever, headache, sore throat, chest pain, difficulty breathing, or dysuria.  Patient reports that he is supposed to have a colonoscopy to evaluate his underlying Crohn's disease.  He is currently on Humira and follows with [facility] gastroenterology.     Impression, Medical Decision Making, ED Course     Impression: This is a 47 y.o. male with a history of Crohn's disease, marijuana use who presents with abdominal pain. Upon initial evaluation in the emergency department, the patient was non-toxic appearing with reassuring vitals as below.    BP 105/77  - Pulse 71  - Temp 36.9 ??C (98.4 ??F) (Oral)  - Resp 16  - Wt 67.7 kg (149 lb 4 oz)  - SpO2 98%  - BMI 22.70 kg/m??     Diagnostic workup as below.  Patient with history of Crohn's disease with numerous ED presentations in the past few months presenting with a 1 day history of abdominal pain.  Per chart review, patient is on Humira and follows with gastroenterology.  They suspect his RLQ inflammation seen on CT scan is chronic given his unchanged radiologic findings on repeat CT scans.  They are planning outpatient colonoscopy to evaluate the extent of his underlying Crohn's disease.  They are concerned for functional versus pain seeking behavior.  My differential includes functional abdominal pain, Crohn's flare, hyperemesis cannabinoid syndrome, among other things. Considered CT abdomen pelvis but low suspicion for acute intraabdominal pathology including obstruction, infection, perforation, or ischemia as soft abdominal exam without tenderness, distention, rebound, or guarding.  No leukocytosis.  No electrolyte disturbances.  Lipase negative.  No elevation inflammatory markers to low suspicion for Crohn's flare.  On reevaluation after multimodal pain regimen, patient with improvement in symptoms and tolerating p.o.  We recommended they continue taking MiraLAX and Bentyl as it was previously prescribed by his gastroenterologist.  We will send a message to his gastroenterologist to see if they can expedite outpatient follow-up for repeat colonoscopy to determine the extent of his underlying Crohn's disease.  Patient agreed with this plan and verbalized understanding of strict and precautions.    Orders Placed This Encounter   Procedures    Influenza/ RSV/COVID PCR    CBC w/ Differential    Comprehensive Metabolic Panel    Lipase    Sedimentation rate, manual    C-reactive protein            ____________________________________________    The case was discussed with Dr. Blima Singer, MD, who is in agreement with the above assessment and plan.    Dictation software was used while making this note. Please excuse any errors made with dictation  software.     Additional Medical Decision Making     I have reviewed the vital signs and the nursing notes. Labs and radiology results that were available during my care of the patient were independently reviewed by me and considered in my medical decision making.     I independently visualized the EKG tracing if performed.  I independently visualized the radiology images if performed.  I reviewed the patient's prior medical records if available.  Additional history obtained from family if available.  I discussed the case with the admitting provider and the consulting services if the patient was admitted and/or consulting services were utilized.     Physical Exam     VITAL SIGNS:      Vitals:    06/15/23 1041 06/15/23 1210   BP: 110/68 105/77   Pulse: 86 71   Resp: 16    Temp: 36.9 ??C (98.4 ??F)    TempSrc: Oral    SpO2: 98% 98%   Weight: 67.7 kg (149 lb 4 oz)        Physical Exam   Constitutional: He appears healthy. No distress.   HENT:   Nose: Nose normal. Mouth/Throat: Oropharynx is clear.   Eyes: Pupils are equal, round, and reactive to light. Conjunctivae are normal.   Cardiovascular: Normal rate, regular rhythm, normal heart sounds, intact distal pulses and normal pulses.   Pulmonary/Chest: Effort normal and breath sounds normal. He has no wheezes. He has no rales. He exhibits no tenderness.   Abdominal: Soft. He exhibits no distension. There is no abdominal tenderness. A hernia is present.   No rebound or guarding   Musculoskeletal:         General: No deformity or edema.   Neurological: He is alert and oriented to person, place, and time.   Skin: Skin is warm and dry.        History     Chief Complaint  Chief Complaint   Patient presents with    Abdominal Pain       HPI   See above     All other systems have been reviewed and are negative except as otherwise documented.    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    Myalgia     Rhinovirus infection     SBO (small bowel obstruction) (CMS-HCC) 2011       Past Surgical History: Procedure Laterality Date    CHOLECYSTECTOMY      COLON SURGERY      partial resection    HERNIA REPAIR      PR CLOSE ENTEROSTOMY,RESEC+COLOREC ANAS Midline 11/01/2019    Procedure: CLO ENTEROSTOMY; W/RESECT COLORECTAL ANASTOM;  Surgeon: Claretta Fraise, MD;  Location: MAIN OR [place];  Service: Gastrointestinal    PR COLONOSCOPY FLX DX W/COLLJ SPEC WHEN PFRMD Left 01/07/2013    Procedure: COLONOSCOPY, FLEXIBLE, PROXIMAL TO SPLENIC FLEXURE; DIAGNOSTIC, W/WO COLLECTION SPECIMEN BY BRUSH OR WASH;  Surgeon: Dala Dock  Marland Mcalpine, MD;  Location: [facility];  Service: Gastroenterology    PR COLONOSCOPY FLX DX W/COLLJ SPEC WHEN PFRMD  03/09/2015    Procedure: COLONOSCOPY, FLEXIBLE, PROXIMAL TO SPLENIC FLEXURE; DIAGNOSTIC, W/WO COLLECTION SPECIMEN BY BRUSH OR WASH;  Surgeon: Cletis Athens, MD;  Location: [facility];  Service: Gastroenterology    PR COLONOSCOPY FLX DX W/COLLJ SPEC WHEN PFRMD N/A 10/28/2019    Procedure: COLONOSCOPY, FLEXIBLE, PROXIMAL TO SPLENIC FLEXURE; DIAGNOSTIC, W/WO COLLECTION SPECIMEN BY BRUSH OR WASH;  Surgeon: Beverly Milch, MD;  Location: [facility];  Service: Gastroenterology    PR COLONOSCOPY FLX DX W/COLLJ SPEC WHEN PFRMD N/A 10/01/2022    Procedure: COLONOSCOPY, FLEXIBLE, PROXIMAL TO SPLENIC FLEXURE; DIAGNOSTIC, W/WO COLLECTION SPECIMEN BY BRUSH OR WASH;  Surgeon: Zetta Bills, MD;  Location: GI [facility]URES [facility];  Service: Gastroenterology    PR EXPLORATORY OF ABDOMEN Midline 09/02/2019    Procedure: EXPLORATORY LAPAROTOMY, EXPLORATORY CELIOTOMY WITH OR WITHOUT BIOPSY(S);  Surgeon: Claretta Fraise, MD;  Location: MAIN OR [facility];  Service: Gastrointestinal    PR EXPLORATORY OF ABDOMEN Midline 11/01/2019    Procedure: EXPLORATORY LAPAROTOMY, EXPLORATORY CELIOTOMY WITH OR WITHOUT BIOPSY(S);  Surgeon: Claretta Fraise, MD;  Location: MAIN OR [facility];  Service: Gastrointestinal    PR FIX FINGER,VOLAR PLATE,I-P JT Left 08/07/2022    Procedure: REPAIR AND RECONSTRUCTION, FINGER, VOLAR PLATE, INTERPHALANGEAL JOINT;  Surgeon: [name], MD;  Location: MAIN OR [facility];  Service: Orthopedics    PR FREEING BOWEL ADHESION,ENTEROLYSIS N/A 11/01/2019    Procedure: Enterolysis (Separt Proc);  Surgeon: Claretta Fraise, MD;  Location: MAIN OR [facility];  Service: Gastrointestinal    PR ILEOSCOPY THRU STOMA,BIOPSY N/A 10/14/2019    Procedure: Karren Cobble; Marquette Saa 1/MX;  Surgeon: Neysa Hotter, MD;  Location: [facility];  Service: Gastroenterology    PR PART REMOVAL COLON W COLOSTOMY Midline 09/02/2019    Procedure: COLECTOMY, PARTIAL; WITH SKIN LEVEL CECOSTOMY OR COLOSTOMY;  Surgeon: Claretta Fraise, MD;  Location: MAIN OR [facility];  Service: Gastrointestinal    PR REPAIR INCISIONAL HERNIA,REDUCIBLE Midline 09/02/2019    Procedure: REPAIR INIT INCISIONAL OR VENTRAL HERNIA; REDUCIBLE;  Surgeon: Kristopher Oppenheim, MD;  Location: MAIN OR [place];  Service: Trauma    PR REPAIR INTERCARP/CARP-METACARP JT Left 08/07/2022    Procedure: Jorene Guest;  Surgeon: [name], MD;  Location: MAIN OR [facility];  Service: Orthopedics    PR UPPER GI ENDOSCOPY,BIOPSY N/A 01/07/2013    Procedure: UGI ENDOSCOPY; WITH BIOPSY, SINGLE OR MULTIPLE;  Surgeon: Tish Men, MD;  Location: [facility];  Service: Gastroenterology    PR UPPER GI ENDOSCOPY,BIOPSY N/A 10/14/2019    Procedure: UGI ENDOSCOPY; WITH BIOPSY, SINGLE OR MULTIPLE;  Surgeon: Neysa Hotter, MD;  Location: [facility];  Service: Gastroenterology    PR UPPER GI ENDOSCOPY,DIAGNOSIS N/A 10/01/2022    Procedure: UGI ENDO, INCLUDE ESOPHAGUS, STOMACH, & DUODENUM &/OR JEJUNUM; DX W/WO COLLECTION SPECIMN, BY BRUSH OR WASH;  Surgeon: Zetta Bills, MD;  Location: GI [facility]URES [facility];  Service: Gastroenterology         Current Facility-Administered Medications:     oxyCODONE (ROXICODONE) immediate release tablet 5 mg, 5 mg, Oral, Once, Eppie Gibson, MD    promethazine (PHENERGAN) 12.5 mg in sodium chloride (NS) 0.9 % 50 mL IVPB, 12.5 mg, Intravenous, Once, Eppie Gibson, MD, Last Rate: 171 mL/hr at 06/15/23 1439, 12.5 mg at 06/15/23 1439    Current Outpatient Medications:  cholecalciferol, vitamin D3 25 mcg, 1,000 units,, 1,000 unit (25 mcg) tablet, Take 1 tablet (25 mcg total) by mouth daily., Disp: 30 tablet, Rfl: 0    dicyclomine (BENTYL) 10 mg capsule, Take 1 capsule (10 mg total) by mouth Four (4) times a day (before meals and nightly)., Disp: 120 capsule, Rfl: 3    empty container Misc, Use as directed, Disp: 1 each, Rfl: 3    HUMIRA PEN CITRATE FREE 40 MG/0.4 ML, Inject the contents of 1 pen (40 mg total) under the skin every seven (7) days., Disp: 4 each, Rfl: 5    OPTICHAMBER DIAMOND V[facility] Spcr, , Disp: , Rfl:     pantoprazole (PROTONIX) 40 MG tablet, Take 1 tablet (40 mg total) by mouth daily., Disp: 90 tablet, Rfl: 0    polyethylene glycol (MIRALAX) 17 gram packet, Take 17 g (1 packet in 4-8 oz of liquid)  by mouth daily., Disp: 90 packet, Rfl: 3    QUEtiapine (SEROQUEL) 100 MG tablet, Take 1 tablet (100 mg total) by mouth nightly., Disp: , Rfl:     Allergies  Morphine and Toradol [ketorolac]    Family History   Problem Relation Age of Onset    Breast cancer Mother     Diabetes Maternal Aunt     Colon cancer Maternal Grandmother     Prostate cancer Maternal Grandfather     Cancer Paternal Grandmother     Crohn's disease Neg Hx        Social History  Social History     Tobacco Use    Smoking status: Every Day     Current packs/day: 0.30     Average packs/day: 0.5 packs/day for 21.7 years (10.3 ttl pk-yrs)     Types: Cigarettes     Start date: 10/02/2001     Passive exposure: Current    Smokeless tobacco: Never    Tobacco comments:     Reduced use from 10cpd to 5cpd, though relights each one. Motivated to quit.    Vaping Use    Vaping status: Some Days    Substances: Nicotine    Passive vaping exposure: Yes   Substance Use Topics Alcohol use: No     Alcohol/week: 0.0 standard drinks of alcohol    Drug use: Yes     Types: Marijuana     Comment: Smokes 2-3 joints/day        Radiology     No orders to display        Laboratory Data     Lab Results   Component Value Date    WBC 5.0 06/15/2023    HGB 12.6 (L) 06/15/2023    HCT 38.1 (L) 06/15/2023    PLT 197 06/15/2023       Lab Results   Component Value Date    NA 145 06/15/2023    K 3.6 06/15/2023    CL 118 (H) 06/15/2023    CO2 24.0 06/15/2023    BUN 6 (L) 06/15/2023    CREATININE 0.91 06/15/2023    GLU 80 06/15/2023    CALCIUM 8.3 (L) 06/15/2023    MG 1.6 04/27/2023    PHOS 2.7 12/17/2020       Lab Results   Component Value Date    BILITOT 0.5 06/15/2023    BILIDIR 0.20 04/26/2023    PROT 5.8 06/15/2023    ALBUMIN 2.9 (L) 06/15/2023    ALT <7 (L) 06/15/2023    AST 13 06/15/2023    ALKPHOS 119 (H)  06/15/2023    GGT 57 01/05/2013       Lab Results   Component Value Date    LABPROT 12.0 01/08/2013    INR 1.08 02/22/2023    APTT 30.0 10/21/2019       Pertinent labs & imaging results that were available during my care of the patient were reviewed by me and considered in my medical decision making (see chart for details).    Portions of this record have been created using Scientist, clinical (histocompatibility and immunogenetics). Dictation errors have been sought, but may not have been identified and corrected.       Eppie Gibson, MD  Resident  06/15/23 [phone]

## 2023-06-15 NOTE — Unmapped (Signed)
BIB [facility] for abdominal pain and emesis x2. Seen here before for same. 107/99, HR 89, 97% ra

## 2023-06-17 NOTE — Unmapped (Signed)
I called the pt several times and he answered , I told him who I was and patient said nothing , the called dropped and I proceeded to call back several times , he answered but said nothing after that I left a message and sent a text for the pt to call and schedule his procedure.

## 2023-06-18 DIAGNOSIS — K50918 Crohn's disease, unspecified, with other complication: Principal | ICD-10-CM

## 2023-06-18 MED ORDER — HUMIRA PEN CITRATE FREE 40 MG/0.4 ML
SUBCUTANEOUS | 5 refills | 28 days | Status: CP
Start: 2023-06-18 — End: ?
  Filled 2023-06-24: qty 4, 28d supply, fill #0

## 2023-06-18 NOTE — Unmapped (Signed)
Encounter for refill request:  Last clinic visit: 05/26/2023    Lab Results   Component Value Date    WBC 5.0 06/15/2023    RBC 4.52 06/15/2023    HGB 12.6 (L) 06/15/2023    HCT 38.1 (L) 06/15/2023    PLT 197 06/15/2023    ALT <7 (L) 06/15/2023    AST 13 06/15/2023    ALKPHOS 119 (H) 06/15/2023    CRP 7.0 06/15/2023    CREATININE 0.91 06/15/2023       humira refills authorized x

## 2023-06-23 NOTE — Unmapped (Signed)
[facility] Refill Coordination Note    Specialty Medication(s) to be Shipped:   Inflammatory Disorders: Humira 40mg /0.90ml    Other medication(s) to be shipped:  empty container     Jerome Kennedy., DOB: 1976-01-27  Phone: [phone] (home)       All above HIPAA information was verified with patient.     Was a Nurse, learning disability used for this call? No    Completed refill call assessment today to schedule patient's medication shipment from the [facility]  [phone]).  All relevant notes have been reviewed.     Specialty medication(s) and dose(s) confirmed: Regimen is correct and unchanged.   Changes to medications: Jerome Kennedy reports no changes at this time.  Changes to insurance: No  New side effects reported not previously addressed with a pharmacist or physician: None reported  Questions for the pharmacist: No    Confirmed patient received a Conservation officer, historic buildings and a Surveyor, mining with first shipment. The patient will receive a drug information handout for each medication shipped and additional FDA Medication Guides as required.       DISEASE/MEDICATION-SPECIFIC INFORMATION        N/A    SPECIALTY MEDICATION ADHERENCE     Medication Adherence    Patient reported X missed doses in the last month: 0  Specialty Medication: HUMIRA(CF) PEN 40 mg/0.4 mL  Patient is on additional specialty medications: No  Informant: patient              Were doses missed due to medication being on hold? No    Humira 40  mg/0.47ml : 1  dose  of medicine on hand       REFERRAL TO PHARMACIST     Referral to the pharmacist: Not needed      [facility]     Shipping address confirmed in Epic.       Delivery Scheduled: Yes, Expected medication delivery date: 06/24/23.     Medication will be delivered via Same Day Courier to the prescription address in Epic WAM.    Jerome Kennedy   [facility]  Specialty Technician

## 2023-06-24 ENCOUNTER — Ambulatory Visit
Admit: 2023-06-24 | Discharge: 2023-06-25 | Disposition: A | Discharge status: Home / Self Care | Payer: PRIVATE HEALTH INSURANCE

## 2023-06-24 ENCOUNTER — Emergency Department
Admit: 2023-06-24 | Discharge: 2023-06-25 | Disposition: A | Discharge status: Home / Self Care | Payer: PRIVATE HEALTH INSURANCE

## 2023-06-24 DIAGNOSIS — R1031 Right lower quadrant pain: Principal | ICD-10-CM

## 2023-06-24 LAB — URINALYSIS WITH MICROSCOPY WITH CULTURE REFLEX PERFORMABLE
BACTERIA: NONE SEEN /HPF
BILIRUBIN UA: NEGATIVE
BLOOD UA: NEGATIVE
GLUCOSE UA: NEGATIVE
KETONES UA: NEGATIVE
LEUKOCYTE ESTERASE UA: NEGATIVE
NITRITE UA: NEGATIVE
PH UA: 6 (ref 5.0–9.0)
RBC UA: 1 /HPF (ref <=3)
SPECIFIC GRAVITY UA: 1.032 — ABNORMAL HIGH (ref 1.003–1.030)
SQUAMOUS EPITHELIAL: 2 /HPF (ref 0–5)
UROBILINOGEN UA: <2
WBC UA: 3 /HPF — ABNORMAL HIGH (ref <=2)

## 2023-06-24 LAB — COMPREHENSIVE METABOLIC PANEL
ALBUMIN: 3.2 g/dL — ABNORMAL LOW (ref 3.4–5.0)
ALKALINE PHOSPHATASE: 141 U/L — ABNORMAL HIGH (ref 46–116)
ALT (SGPT): <7 U/L — ABNORMAL LOW (ref 10–49)
ANION GAP: 8 mmol/L (ref 5–14)
AST (SGOT): 13 U/L (ref <=34)
BILIRUBIN TOTAL: 0.6 mg/dL (ref 0.3–1.2)
BLOOD UREA NITROGEN: 11 mg/dL (ref 9–23)
BUN / CREAT RATIO: 12
CALCIUM: 8.4 mg/dL — ABNORMAL LOW (ref 8.7–10.4)
CHLORIDE: 110 mmol/L — ABNORMAL HIGH (ref 98–107)
CO2: 23 mmol/L (ref 20.0–31.0)
CREATININE: 0.91 mg/dL (ref 0.73–1.18)
EGFR CKD-EPI (2021) MALE: >90 mL/min/{1.73_m2} (ref >=60)
GLUCOSE RANDOM: 87 mg/dL (ref 70–179)
POTASSIUM: 3.5 mmol/L (ref 3.4–4.8)
PROTEIN TOTAL: 6.3 g/dL (ref 5.7–8.2)
SODIUM: 141 mmol/L (ref 135–145)

## 2023-06-24 LAB — CBC W/ AUTO DIFF
BASOPHILS ABSOLUTE COUNT: 0.1 10*9/L (ref 0.0–0.1)
BASOPHILS RELATIVE PERCENT: 1 %
EOSINOPHILS ABSOLUTE COUNT: 0.2 10*9/L (ref 0.0–0.5)
EOSINOPHILS RELATIVE PERCENT: 2.3 %
HEMATOCRIT: 40.1 % (ref 39.0–48.0)
HEMOGLOBIN: 13.1 g/dL (ref 12.9–16.5)
LYMPHOCYTES ABSOLUTE COUNT: 2.2 10*9/L (ref 1.1–3.6)
LYMPHOCYTES RELATIVE PERCENT: 32.9 %
MEAN CORPUSCULAR HEMOGLOBIN CONC: 32.7 g/dL (ref 32.0–36.0)
MEAN CORPUSCULAR HEMOGLOBIN: 27.8 pg (ref 25.9–32.4)
MEAN CORPUSCULAR VOLUME: 85.1 fL (ref 77.6–95.7)
MEAN PLATELET VOLUME: 8.3 fL (ref 6.8–10.7)
MONOCYTES ABSOLUTE COUNT: 0.5 10*9/L (ref 0.3–0.8)
MONOCYTES RELATIVE PERCENT: 6.7 %
NEUTROPHILS ABSOLUTE COUNT: 3.8 10*9/L (ref 1.8–7.8)
NEUTROPHILS RELATIVE PERCENT: 57.1 %
PLATELET COUNT: 216 10*9/L (ref 150–450)
RED BLOOD CELL COUNT: 4.71 10*12/L (ref 4.26–5.60)
RED CELL DISTRIBUTION WIDTH: 14.2 % (ref 12.2–15.2)
WBC ADJUSTED: 6.7 10*9/L (ref 3.6–11.2)

## 2023-06-24 LAB — LIPASE: LIPASE: 30 U/L (ref 12–53)

## 2023-06-24 LAB — SEDIMENTATION RATE: ERYTHROCYTE SEDIMENTATION RATE: 12 mm/h (ref 0–15)

## 2023-06-24 LAB — C-REACTIVE PROTEIN: C-REACTIVE PROTEIN: 13 mg/L — ABNORMAL HIGH (ref <=10.0)

## 2023-06-24 LAB — MAGNESIUM: MAGNESIUM: 1.6 mg/dL (ref 1.6–2.6)

## 2023-06-24 MED ADMIN — fentaNYL (PF) (SUBLIMAZE) injection 50 mcg: 50 ug | INTRAVENOUS | Stop: 2023-06-24

## 2023-06-24 MED ADMIN — fentaNYL (PF) (SUBLIMAZE) injection 50 mcg: 50 ug | INTRAVENOUS | @ 22:00:00 | Stop: 2023-06-24

## 2023-06-24 MED ADMIN — lactated ringers bolus 1,000 mL: 1000 mL | INTRAVENOUS | @ 22:00:00 | Stop: 2023-06-24

## 2023-06-24 MED FILL — EMPTY CONTAINER: 120 days supply | Qty: 1 | Fill #1

## 2023-06-24 NOTE — Unmapped (Signed)
[facility]  Emergency Department Provider Note     ED Clinical Impression     Final diagnoses:   Right lower quadrant abdominal pain (Primary)      Impression, Medical Decision Making, ED Course     Impression: 47 y.o. male who has a past medical history of Anemia (04/05/2022), Anxiety, Avascular necrosis of femur head, right (CMS-HCC) (2011), Bipolar disorder (CMS-HCC) (2013), Cannabis use disorder, COPD (chronic obstructive pulmonary disease) (CMS-HCC) (2022), Crohn's disease (CMS-HCC) (1990), Depression (2007), DVT (deep venous thrombosis) (CMS-HCC) (02/08/2010), Myalgia, Rhinovirus infection, and SBO (small bowel obstruction) (CMS-HCC) (2011). who presents with 3 hours of right lower quadrant pain with associated nonbloody emesis.  Patient has sizable known central hernia as well as Crohn's disease.    DDx/MDM:   Problems are differential was considered for the patient.  Given that patient has right lower quadrant pain that is painful to palpation and there is associated emesis, primary concern is ruling out appendicitis.  Of course, this could be sequela of his abdominal hernia as well as known Crohn's disease.  However, other possible though less likely etiologies include Crohn's flare, bowel ischemia, bowel obstruction, complication of hernia, perforation, complication of intra-abdominal lesions secondary to prior surgery, Constipation.  Notably, patient has had his gallbladder removed so no/low concern related to his biliary system.  He also reports a mesh hernia surgery back in 2007.  I will obtain a CT abdomen pelvis to evaluate for possible new findings.     I spoke with our radiology team and the CT findings were consistent with prior, which is reassuring, though they state both do appear as an acute flare. There is a note from Dr. [name] who states these findings are favored to be chronic and the patient would benefit from a scope. I reached out to the GI team who have sent a message to their schedulers to contact the patient to schedule an outpatient scope. I also provided this number in the patient's discharge instruction should he not hear from them in a timely manner. Patient was agreeable to the plan of discharge after being presented with possible options of admission vs discharge with outpatient follow up.  I explained the risk and benefits of staying for admission versus discharging and following up in the outpatient setting, and patient still elected to discharge.    I recommend admitting this patient and treating him for an acute flare if he presents to the ED again with similar or worsened complaints    Diagnostic workup as below.     Orders Placed This Encounter   Procedures    CT Abdomen Pelvis W Contrast    CBC w/ Differential    Comprehensive Metabolic Panel    Lipase    Magnesium    Urinalysis with Microscopy with Culture Reflex    Sedimentation rate, manual    C-reactive protein    Saline lock IV    Insert peripheral IV       ED Course as of 06/24/23 2129   Wed Jun 24, 2023   1728 Patient was an incredibly difficult stick for obtaining IV access.  An EJ line was placed for fluid administration as well as nausea and pain meds.   2100 CT finding of distal ileum inflammation is consistent with prior CT scans, and suggested as chronic finding per outpatient GI note from Dr. [name].       MDM Elements  Discussion of Management with other Physicians, QHP or Appropriate Source: None  Independent  Interpretation of Studies: CT scan(s) - abdomen pelvis  I have reviewed recent and relevant previous record, including: Outpatient notes - [facility] 05/26/2023  Escalation of Care including OBS/Admission/Transfer was considered: However, patient was determined to be appropriate for outpatient management.  ____________________________________________    The case was discussed with the attending physician, who is in agreement with the above assessment and plan.      History     Chief Complaint  Chief Complaint   Patient presents with    Abdominal Pain       HPI   Jerome Claudio. is a 47 y.o. male with past medical history as below who presents with 3 hours of right lower quadrant abdominal pain and associated nonbloody emesis.  Notably, patient presented to the ED on 06/15/2023 with similar symptoms that self resolved the patient was discharged.  He denies additional systemic symptoms such as fevers, shortness of breath, chest pain, chills.    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    Myalgia     Rhinovirus infection     SBO (small bowel obstruction) (CMS-HCC) 2011       Past Surgical History:   Procedure Laterality Date    CHOLECYSTECTOMY      COLON SURGERY      partial resection    HERNIA REPAIR      PR CLOSE ENTEROSTOMY,RESEC+COLOREC ANAS Midline 11/01/2019    Procedure: CLO ENTEROSTOMY; W/RESECT COLORECTAL ANASTOM;  Surgeon: Claretta Fraise, MD;  Location: MAIN OR [place];  Service: Gastrointestinal    PR COLONOSCOPY FLX DX W/COLLJ SPEC WHEN PFRMD Left 01/07/2013    Procedure: COLONOSCOPY, FLEXIBLE, PROXIMAL TO SPLENIC FLEXURE; DIAGNOSTIC, W/WO COLLECTION SPECIMEN BY BRUSH OR WASH;  Surgeon: Tish Men, MD;  [facility]: [facility];  Service: Gastroenterology    PR COLONOSCOPY FLX DX W/COLLJ SPEC WHEN PFRMD  03/09/2015    Procedure: COLONOSCOPY, FLEXIBLE, PROXIMAL TO SPLENIC FLEXURE; DIAGNOSTIC, W/WO COLLECTION SPECIMEN BY BRUSH OR WASH;  Surgeon: Cletis Athens, MD;  Location: GI [facility]URES [facility]; Service: Gastroenterology    PR COLONOSCOPY FLX DX W/COLLJ SPEC WHEN PFRMD N/A 10/28/2019    Procedure: COLONOSCOPY, FLEXIBLE, PROXIMAL TO SPLENIC FLEXURE; DIAGNOSTIC, W/WO COLLECTION SPECIMEN BY BRUSH OR WASH;  Surgeon: Beverly Milch, MD;  Location: [facility];  Service: Gastroenterology    PR COLONOSCOPY FLX DX W/COLLJ SPEC WHEN PFRMD N/A 10/01/2022    Procedure: COLONOSCOPY, FLEXIBLE, PROXIMAL TO SPLENIC FLEXURE; DIAGNOSTIC, W/WO COLLECTION SPECIMEN BY BRUSH OR WASH;  Surgeon: Zetta Bills, MD;  Location: GI [facility]URES [facility];  Service: Gastroenterology    PR EXPLORATORY OF ABDOMEN Midline 09/02/2019    Procedure: EXPLORATORY LAPAROTOMY, EXPLORATORY CELIOTOMY WITH OR WITHOUT BIOPSY(S);  Surgeon: Claretta Fraise, MD;  Location: MAIN OR [place];  Service: Gastrointestinal    PR EXPLORATORY OF ABDOMEN Midline 11/01/2019    Procedure: EXPLORATORY LAPAROTOMY, EXPLORATORY CELIOTOMY WITH OR WITHOUT BIOPSY(S);  Surgeon: Claretta Fraise, MD;  Location: MAIN OR [facility];  Service: Gastrointestinal    PR FIX FINGER,VOLAR PLATE,I-P JT Left 08/07/2022    Procedure: REPAIR AND RECONSTRUCTION, FINGER, VOLAR PLATE, INTERPHALANGEAL JOINT;  Surgeon: [name], MD;  Location: MAIN OR [facility];  Service: Orthopedics    PR FREEING BOWEL ADHESION,ENTEROLYSIS N/A 11/01/2019    Procedure: Enterolysis (Separt Proc);  Surgeon: Claretta Fraise, MD;  Location: MAIN OR [facility];  Service: Gastrointestinal    PR ILEOSCOPY THRU STOMA,BIOPSY N/A 10/14/2019    Procedure: Karren Cobble; [place] Saa 1/MX;  Surgeon: Neysa Hotter, MD;  Location: [facility];  Service: Gastroenterology    PR PART REMOVAL COLON W COLOSTOMY Midline 09/02/2019    Procedure: COLECTOMY, PARTIAL; WITH SKIN LEVEL CECOSTOMY OR COLOSTOMY;  Surgeon: Claretta Fraise, MD;  Location: MAIN OR [facility];  Service: Gastrointestinal    PR REPAIR INCISIONAL HERNIA,REDUCIBLE Midline 09/02/2019    Procedure: REPAIR INIT INCISIONAL OR VENTRAL HERNIA; REDUCIBLE;  Surgeon: Kristopher Oppenheim, MD;  Location: MAIN OR [place];  Service: Trauma    PR REPAIR INTERCARP/CARP-METACARP JT Left 08/07/2022    Procedure: Jorene Guest;  Surgeon: [name], MD;  Location: MAIN OR [facility];  Service: Orthopedics    PR UPPER GI ENDOSCOPY,BIOPSY N/A 01/07/2013    Procedure: UGI ENDOSCOPY; WITH BIOPSY, SINGLE OR MULTIPLE;  Surgeon: Tish Men, MD;  Location: [facility];  Service: Gastroenterology    PR UPPER GI ENDOSCOPY,BIOPSY N/A 10/14/2019    Procedure: UGI ENDOSCOPY; WITH BIOPSY, SINGLE OR MULTIPLE;  Surgeon: Neysa Hotter, MD;  Location: [facility]URES [facility];  Service: Gastroenterology    PR UPPER GI ENDOSCOPY,DIAGNOSIS N/A 10/01/2022    Procedure: UGI ENDO, INCLUDE ESOPHAGUS, STOMACH, & DUODENUM &/OR JEJUNUM; DX W/WO COLLECTION SPECIMN, BY BRUSH OR WASH;  Surgeon: Zetta Bills, MD;  Location: GI [facility]URES [facility];  Service: Gastroenterology         Current Facility-Administered Medications:     fentaNYL (PF) (SUBLIMAZE) injection 50 mcg, 50 mcg, Intravenous, Once, Dyanne Iha, MD    Current Outpatient Medications:     cholecalciferol, vitamin D3 25 mcg, 1,000 units,, 1,000 unit (25 mcg) tablet, Take 1 tablet (25 mcg total) by mouth daily., Disp: 30 tablet, Rfl: 0    dicyclomine (BENTYL) 10 mg capsule, Take 1 capsule (10 mg total) by mouth Four (4) times a day (before meals and nightly)., Disp: 120 capsule, Rfl: 3    empty container Misc, Use as directed, Disp: 1 each, Rfl: 3    HUMIRA PEN CITRATE FREE 40 MG/0.4 ML, Inject the contents of 1 pen (40 mg total) under the skin every seven (7) days., Disp: 4 each, Rfl: 5    OPTICHAMBER DIAMOND V[facility] Spcr, , Disp: , Rfl:     pantoprazole (PROTONIX) 40 MG tablet, Take 1 tablet (40 mg total) by mouth daily., Disp: 90 tablet, Rfl: 0    polyethylene glycol (MIRALAX) 17 gram packet, Take 17 g (1 packet in 4-8 oz of liquid)  by mouth daily., Disp: 90 packet, Rfl: 3    QUEtiapine (SEROQUEL) 100 MG tablet, Take 1 tablet (100 mg total) by mouth nightly., Disp: , Rfl:     Allergies  Morphine and Toradol [ketorolac]    Family History  Family History   Problem Relation Age of Onset    Breast cancer Mother     Diabetes Maternal Aunt     Colon cancer Maternal Grandmother     Prostate cancer Maternal Grandfather  Cancer Paternal Grandmother     Crohn's disease Neg Hx        Social History  Social History     Tobacco Use    Smoking status: Every Day     Current packs/day: 0.30     Average packs/day: 0.5 packs/day for 21.7 years (10.3 ttl pk-yrs)     Types: Cigarettes     Start date: 10/02/2001     Passive exposure: Current    Smokeless tobacco: Never    Tobacco comments:     Reduced use from 10cpd to 5cpd, though relights each one. Motivated to quit.    Vaping Use    Vaping status: Some Days    Substances: Nicotine    Passive vaping exposure: Yes   Substance Use Topics    Alcohol use: No     Alcohol/week: 0.0 standard drinks of alcohol    Drug use: Yes     Types: Marijuana     Comment: Smokes 2-3 joints/day        Physical Exam     VITAL SIGNS:      Vitals:    06/24/23 1401   BP: 115/82   Pulse: 86   Resp: 18   Temp: 36.2 ??C (97.1 ??F)   TempSrc: Oral   SpO2: 97%   Weight: 67.6 kg (149 lb)       Constitutional: Alert and oriented. No acute distress.  Eyes: Conjunctivae are normal.  HEENT: Normocephalic and atraumatic. Conjunctivae clear. No congestion. Moist mucous membranes.   Cardiovascular: Rate as above, regular rhythm. Normal and symmetric distal pulses. Brisk capillary refill. Normal skin turgor.  Respiratory: Normal respiratory effort. Breath sounds are normal. There are no wheezing or crackles heard.  Gastrointestinal: Large central abdominal hernia, endorsed right lower quadrant tenderness with palpation.  Genitourinary: Deferred.  Musculoskeletal: Non-tender with normal range of motion in all extremities.  Neurologic: Normal speech and language. No gross focal neurologic deficits are appreciated. Patient is moving all extremities equally, face is symmetric at rest and with speech.  Skin: Skin is warm, dry and intact. No rash noted.  Psychiatric: Mood and affect are normal. Speech and behavior are normal.     Radiology     CT Abdomen Pelvis W Contrast   Final Result   -Findings concerning for acute Crohn disease involving the distal ileum.      -Additional findings, as described above.          Pertinent labs & imaging results that were available during my care of the patient were independently interpreted by me and considered in my medical decision making (see chart for details).    Portions of this record have been created using Scientist, clinical (histocompatibility and immunogenetics). Dictation errors have been sought, but may not have been identified and corrected.         Dyanne Iha, MD  Resident  06/24/23 2129

## 2023-06-24 NOTE — Unmapped (Signed)
BIB [facility] for abd pain & emesis x 3 hours. Hx of Crohn's and hernia (getting surgery next month)    4 mg PO zofran, 50 mcg Fentanyl IM.

## 2023-06-24 NOTE — Unmapped (Signed)
BIB [facility] - Abd pain vomiting x3 , Hx chron's, abd hernia (surgery next month). 4 MG PO zofran.     V/S - stable

## 2023-06-25 MED ADMIN — iohexol (OMNIPAQUE) 350 mg iodine/mL solution 100 mL: 100 mL | INTRAVENOUS | @ 01:00:00 | Stop: 2023-06-24

## 2023-06-25 MED ADMIN — fentaNYL (PF) (SUBLIMAZE) injection 50 mcg: 50 ug | INTRAVENOUS | @ 03:00:00 | Stop: 2023-06-24

## 2023-06-25 MED ADMIN — diphenhydrAMINE (BENADRYL) capsule/tablet 25 mg: 25 mg | ORAL | @ 01:00:00 | Stop: 2023-06-24

## 2023-06-25 MED ADMIN — promethazine (PHENERGAN) 12.5 mg in sodium chloride (NS) 0.9 % 50 mL IVPB: 12.5 mg | INTRAVENOUS | Stop: 2023-06-24

## 2023-06-25 NOTE — Unmapped (Signed)
[facility]  Telephone Note         Recommendations:   Jerome Kennedy. is a 47 y.o. male that presented to [facility] with abdominal pain. I was called by the ED for recommendations regarding this patients  abdominal pain .    Paged by ER physician regarding patient presenting with ongoing chronic abdominal pain. CT AP obtained Findings concerning for acute Crohn disease involving the distal ileum. Reviewed outpatient GI physician, Dr. Marigene Ehlers note continues to have recurrent episodes of abdominal pain prompting presentation to multiple medical centers including [facility], [facility] and [facility] with CT scans demonstrating wall thickening of the neo-terminal ileum. Suspect that this finding is chronic rather than representing active disease given presence of similar findings on CTAP Feb 2023 with colonoscopy during that time ([facility]) showing a patent anastomosis with no evidence of active disease. His most recent CT scan in Sept showed similar findings with an indeterminate fecal calprotectin level (64).    Dispo plan pending at time of discussion with ER.    Asked to help set up outpatient follow up. I have sent an in-basket message to Dr. [name] and GI schedulers to arrange outpatient follow up.    Should patient be admitted, please page GI Luminal Consult team for full consult in the morning.     A request has been placed for the patient to be seen in our clinic, if the patient does not hear from our schedulers they may call [phone] to schedule.    I have neither seen nor examined this patient, I have only discussed the patient with the requesting provider and briefly reviewed the patient's chart. I have provided the requesting provider with the above recommendations based on this limited evaluation. Ultimately, the final decision making regarding the patient's care lies with the requesting provider.

## 2023-06-30 ENCOUNTER — Ambulatory Visit: Admit: 2023-06-30 | Discharge: 2023-07-01 | Payer: PRIVATE HEALTH INSURANCE

## 2023-06-30 LAB — COMPREHENSIVE METABOLIC PANEL
ALBUMIN: 3.3 g/dL — ABNORMAL LOW (ref 3.4–5.0)
ALKALINE PHOSPHATASE: 142 U/L — ABNORMAL HIGH (ref 46–116)
ALT (SGPT): 8 U/L — ABNORMAL LOW (ref 10–49)
ANION GAP: 6 mmol/L (ref 5–14)
AST (SGOT): 16 U/L (ref <=34)
BILIRUBIN TOTAL: 0.6 mg/dL (ref 0.3–1.2)
BLOOD UREA NITROGEN: 8 mg/dL — ABNORMAL LOW (ref 9–23)
BUN / CREAT RATIO: 9
CALCIUM: 8.2 mg/dL — ABNORMAL LOW (ref 8.7–10.4)
CHLORIDE: 107 mmol/L (ref 98–107)
CO2: 27 mmol/L (ref 20.0–31.0)
CREATININE: 0.92 mg/dL (ref 0.73–1.18)
EGFR CKD-EPI (2021) MALE: >90 mL/min/{1.73_m2} (ref >=60)
GLUCOSE RANDOM: 92 mg/dL (ref 70–179)
POTASSIUM: 3.7 mmol/L (ref 3.4–4.8)
PROTEIN TOTAL: 6.6 g/dL (ref 5.7–8.2)
SODIUM: 140 mmol/L (ref 135–145)

## 2023-06-30 LAB — CBC W/ AUTO DIFF
BASOPHILS ABSOLUTE COUNT: 0 10*9/L (ref 0.0–0.1)
BASOPHILS RELATIVE PERCENT: 0.4 %
EOSINOPHILS ABSOLUTE COUNT: 0.1 10*9/L (ref 0.0–0.5)
EOSINOPHILS RELATIVE PERCENT: 1.2 %
HEMATOCRIT: 42.9 % (ref 39.0–48.0)
HEMOGLOBIN: 14.2 g/dL (ref 12.9–16.5)
LYMPHOCYTES ABSOLUTE COUNT: 2.2 10*9/L (ref 1.1–3.6)
LYMPHOCYTES RELATIVE PERCENT: 39.7 %
MEAN CORPUSCULAR HEMOGLOBIN CONC: 33.1 g/dL (ref 32.0–36.0)
MEAN CORPUSCULAR HEMOGLOBIN: 28.2 pg (ref 25.9–32.4)
MEAN CORPUSCULAR VOLUME: 85.1 fL (ref 77.6–95.7)
MONOCYTES ABSOLUTE COUNT: 0.8 10*9/L (ref 0.3–0.8)
MONOCYTES RELATIVE PERCENT: 13.9 %
NEUTROPHILS ABSOLUTE COUNT: 2.5 10*9/L (ref 1.8–7.8)
NEUTROPHILS RELATIVE PERCENT: 44.8 %
PLATELET COUNT: >124 10*9/L — ABNORMAL LOW (ref 150–450)
RED BLOOD CELL COUNT: 5.05 10*12/L (ref 4.26–5.60)
RED CELL DISTRIBUTION WIDTH: 14.4 % (ref 12.2–15.2)
WBC ADJUSTED: 5.6 10*9/L (ref 3.6–11.2)

## 2023-06-30 LAB — URINALYSIS WITH MICROSCOPY WITH CULTURE REFLEX PERFORMABLE
BILIRUBIN UA: NEGATIVE
BLOOD UA: NEGATIVE
GLUCOSE UA: NEGATIVE
KETONES UA: NEGATIVE
LEUKOCYTE ESTERASE UA: NEGATIVE
NITRITE UA: NEGATIVE
PH UA: 6.5 (ref 5.0–9.0)
PROTEIN UA: 30 — AB
RBC UA: 2 /HPF (ref <=3)
SPECIFIC GRAVITY UA: 1.026 (ref 1.003–1.030)
SQUAMOUS EPITHELIAL: 2 /HPF (ref 0–5)
UROBILINOGEN UA: 3 — AB
WBC UA: 1 /HPF (ref <=2)

## 2023-06-30 LAB — LACTATE, VENOUS, WHOLE BLOOD: LACTATE BLOOD VENOUS: 2.2 mmol/L — ABNORMAL HIGH (ref 0.5–1.8)

## 2023-06-30 LAB — LIPASE: LIPASE: 33 U/L (ref 12–53)

## 2023-06-30 LAB — C-REACTIVE PROTEIN: C-REACTIVE PROTEIN: 43 mg/L — ABNORMAL HIGH (ref <=10.0)

## 2023-06-30 LAB — SEDIMENTATION RATE: ERYTHROCYTE SEDIMENTATION RATE: <1 mm/h (ref 0–15)

## 2023-06-30 MED ADMIN — diphenhydrAMINE (BENADRYL) capsule/tablet 25 mg: 25 mg | ORAL | @ 23:00:00 | Stop: 2023-06-30

## 2023-06-30 MED ADMIN — promethazine (PHENERGAN) 12.5 mg in sodium chloride (NS) 0.9 % 50 mL IVPB: 12.5 mg | INTRAVENOUS | @ 21:00:00 | Stop: 2023-06-30

## 2023-06-30 MED ADMIN — oxyCODONE (ROXICODONE) immediate release tablet 5 mg: 5 mg | ORAL | @ 22:00:00 | Stop: 2023-06-30

## 2023-06-30 MED ADMIN — oxyCODONE (ROXICODONE) immediate release tablet 5 mg: 5 mg | ORAL | @ 21:00:00 | Stop: 2023-06-30

## 2023-06-30 NOTE — Unmapped (Signed)
[facility]  Emergency Department Provider Note      ED Clinical Impression      Final diagnoses:   Crohn's disease with complication, unspecified gastrointestinal tract location (CMS-HCC) (Primary)            Impression, Medical Decision Making, Progress Notes and Critical Care      Impression, Differential Diagnosis and Plan of Care    Jerome Kennedy. is a 47 y.o. male PMH Crohn's disease s/p  ileocecectomy 2003 w/ hx of SBO, COPD presenting for R flank pain with nausea, vomiting, diarrhea for the past 2 days.     On exam, patient in no acute distress, signs stable, afebrile, no tachycardia, O2 sats 100% on room air. Abdominal TTP to RUQ, RLQ with RCVAT.  Will plan to obtain labs and imaging for further evaluation at this time.  Differential to include Crohn's flare, pyelonephritis, nephrolithiasis, UTI, obstruction, gastritis, gastroenteritis.    Labs as reviewed below which are largely unremarkable aside from increased CRP, he has no leukocytosis and the rest of his labs are within normal limits.  CT scan is pending at this time.     9:00 PM  Patient case signed out to attending, Dr. Marene Lenz, pending CT scan results. Patient stable at this time though has required multiple rounds of antiemetics and pain control, symptoms still ongoing at this time.      Additional Progress Notes  ED Course as of 07/01/23 1101   Tue Jun 30, 2023   1545 CBC without leukocytosis, hemoglobin stable.  CMP largely unremarkable and consistent with previous.  UA negative   1557 Lipase wnl   1558 CRP 43 which is elevated from 1 week ago   2048 I discussed with GI, we are still pending CT scan at this time.  If unable to get pain under control, can be admitted and they will plan to consult in the morning.  If able to control pain, and CT scan is unremarkable, can be discharged home with close GI follow-up.         Portions of this record have been created using Scientist, clinical (histocompatibility and immunogenetics). Dictation errors have been sought, but may not have been identified and corrected.    See chart and resident provider documentation for details.    ____________________________________________         History        Reason for Visit  Flank Pain      HPI   Jerome Kennedy. is a 47 y.o. male PMH Crohn's disease s/p  ileocecectomy 2003 w/ hx of SBO, COPD presenting for R flank pain with nausea, vomiting, diarrhea for the past 2 days.  Notes 3-4 episodes of nonbloody nonbilious emesis along with 3-4 episodes of nonbloody diarrhea daily over the past 2 days.  Reports increased urinary frequency, denies dysuria.  Endorses chills and subjective fevers at home.  States that his symptoms do feel consistent with the previous Crohn's flare.  He was seen here for same 6 days ago, workup was largely unremarkable and patient was feeling better and ready for discharge.  He notes that over the past 2 days had worsening symptoms.   Has not taken any other medicine for relief of his symptoms.  Denies chest pain, shortness of breath, hematochezia, melena, hematuria    Outside Historian(s)  (EMS, Significant Other, Family, Parent, Caregiver, Friend, Patent examiner, etc.)        External Records Reviewed  (Inpatient/Outpatient notes, Prior labs/imaging studies, Care Everywhere,  PDMP, External ED notes, etc)    ER visit from 11/20 presenting with right lower quadrant abdominal pain, had labs and imaging completed at that time which was notable for Crohn's flare.  Case was discussed with GI who set patient up for an outpatient scope, this were favored to be chronic.  It was recommended that patient be admitted and treated for acute flare if he represented again with similar worsening symptoms    Past Medical History:   Diagnosis Date    Anemia 04/05/2022    Anxiety     Avascular necrosis of femur head, right (CMS-HCC) 2011    Bipolar disorder (CMS-HCC) 2013    Follows the Psychiatry    Cannabis use disorder     COPD (chronic obstructive pulmonary disease) (CMS-HCC) 2022 Crohn's disease (CMS-HCC) 1990    Depression 2007    Severe depressive episode with psychotic symptoms    DVT (deep venous thrombosis) (CMS-HCC) 02/08/2010    DVT of superior vena cava, left brachiocephalic, 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, left       Past Surgical History:   Procedure Laterality Date    CHOLECYSTECTOMY      COLON SURGERY      partial resection    HERNIA REPAIR      PR CLOSE ENTEROSTOMY,RESEC+COLOREC ANAS Midline 11/01/2019    Procedure: CLO ENTEROSTOMY; W/RESECT COLORECTAL ANASTOM;  Surgeon: Claretta Fraise, MD;  Location: MAIN OR [place];  Service: Gastrointestinal    PR COLONOSCOPY FLX DX W/COLLJ SPEC WHEN PFRMD Left 01/07/2013    Procedure: COLONOSCOPY, FLEXIBLE, PROXIMAL TO SPLENIC FLEXURE; DIAGNOSTIC, W/WO COLLECTION SPECIMEN BY BRUSH OR WASH;  Surgeon: Tish Men, MD;  Location: [facility];  Service: Gastroenterology    PR COLONOSCOPY FLX DX W/COLLJ SPEC WHEN PFRMD  03/09/2015    Procedure: COLONOSCOPY, FLEXIBLE, PROXIMAL TO SPLENIC FLEXURE; DIAGNOSTIC, W/WO COLLECTION SPECIMEN BY BRUSH OR WASH;  Surgeon: Cletis Athens, MD;  Location: GI PROCEDURES [facility];  Service: Gastroenterology    PR COLONOSCOPY FLX DX W/COLLJ SPEC WHEN PFRMD N/A 10/28/2019    Procedure: COLONOSCOPY, FLEXIBLE, PROXIMAL TO SPLENIC FLEXURE; DIAGNOSTIC, W/WO COLLECTION SPECIMEN BY BRUSH OR WASH;  Surgeon: Beverly Milch, MD;  Location: GI [facility]URES [facility];  Service: Gastroenterology    PR COLONOSCOPY FLX DX W/COLLJ SPEC WHEN PFRMD N/A 10/01/2022    Procedure: COLONOSCOPY, FLEXIBLE, PROXIMAL TO SPLENIC FLEXURE; DIAGNOSTIC, W/WO COLLECTION SPECIMEN BY BRUSH OR WASH;  Surgeon: Zetta Bills, MD;  Location: GI PROCEDURES [facility];  Service: Gastroenterology    PR EXPLORATORY OF ABDOMEN Midline 09/02/2019    Procedure: EXPLORATORY  LAPAROTOMY, EXPLORATORY CELIOTOMY WITH OR WITHOUT BIOPSY(S);  Surgeon: Claretta Fraise, MD;  Location: MAIN OR [facility];  Service: Gastrointestinal    PR EXPLORATORY OF ABDOMEN Midline 11/01/2019    Procedure: EXPLORATORY LAPAROTOMY, EXPLORATORY CELIOTOMY WITH OR WITHOUT BIOPSY(S);  Surgeon: Claretta Fraise, MD;  Location: MAIN OR [facility];  Service: Gastrointestinal    PR FIX FINGER,VOLAR PLATE,I-P JT Left 08/07/2022    Procedure: REPAIR AND RECONSTRUCTION, FINGER, VOLAR PLATE, INTERPHALANGEAL JOINT;  Surgeon: [name], MD;  Location: MAIN OR [facility];  Service: Orthopedics    PR FREEING BOWEL ADHESION,ENTEROLYSIS N/A 11/01/2019    Procedure: Enterolysis (Separt Proc);  Surgeon: Claretta Fraise, MD;  Location: MAIN OR [facility];  Service: Gastrointestinal    PR ILEOSCOPY THRU STOMA,BIOPSY N/A 10/14/2019    Procedure: Karren Cobble; [place] Saa 1/MX;  Surgeon: Neysa Hotter, MD;  Location: GI [facility]URES [facility];  Service: Gastroenterology    PR PART REMOVAL COLON W COLOSTOMY Midline 09/02/2019    Procedure: COLECTOMY, PARTIAL; WITH SKIN LEVEL CECOSTOMY OR COLOSTOMY;  Surgeon: Claretta Fraise, MD;  Location: MAIN OR [facility];  Service: Gastrointestinal    PR REPAIR INCISIONAL HERNIA,REDUCIBLE Midline 09/02/2019    Procedure: REPAIR INIT INCISIONAL OR VENTRAL HERNIA; REDUCIBLE;  Surgeon: Kristopher Oppenheim, MD;  Location: MAIN OR [place];  Service: Trauma    PR REPAIR INTERCARP/CARP-METACARP JT Left 08/07/2022    Procedure: Jorene Guest;  Surgeon: [name], MD;  Location: MAIN OR [facility];  Service: Orthopedics    PR UPPER GI ENDOSCOPY,BIOPSY N/A 01/07/2013    Procedure: UGI ENDOSCOPY; WITH BIOPSY, SINGLE OR MULTIPLE;  Surgeon: Tish Men, MD;  Location: GI [facility];  Service: Gastroenterology    PR UPPER GI ENDOSCOPY,BIOPSY N/A 10/14/2019    Procedure: UGI ENDOSCOPY; WITH BIOPSY, SINGLE OR MULTIPLE;  Surgeon: Neysa Hotter, MD;  Location: GI [facility]URES [facility];  Service: Gastroenterology    PR UPPER GI ENDOSCOPY,DIAGNOSIS N/A 10/01/2022    Procedure: UGI ENDO, INCLUDE ESOPHAGUS, STOMACH, & DUODENUM &/OR JEJUNUM; DX W/WO COLLECTION SPECIMN, BY BRUSH OR WASH;  Surgeon: Zetta Bills, MD;  Location: GI PROCEDURES [facility];  Service: Gastroenterology         Current Facility-Administered Medications:     acetaminophen (TYLENOL) tablet 650 mg, 650 mg, Oral, Q6H PRN, Short, Samuel, MD    alum-mag-simeth (MAALOX PLUS) 200-200-20 mg/5 mL suspension 60 mL, 60 mL, Oral, Q6H PRN, Short, Samuel, MD    cholecalciferol (vitamin D3 25 mcg (1,000 units)) tablet 25 mcg, 25 mcg, Oral, Daily, Short, Samuel, MD    dicyclomine (BENTYL) capsule 10 mg, 10 mg, Oral, ACHS, Short, Samuel, MD    enoxaparin (LOVENOX) syringe 40 mg, 40 mg, Subcutaneous, Q24H, Short, Samuel, MD, 40 mg at 07/01/23 0830    guaiFENesin (ROBITUSSIN) oral syrup, 200 mg, Oral, Q4H PRN, Short, Samuel, MD    melatonin tablet 3 mg, 3 mg, Oral, Nightly PRN, Short, Samuel, MD    menthol (COUGH DROPS) lozenge 1 lozenge, 1 lozenge, Oral, Q2H PRN, [place]cott Gum, Portia, MD    ondansetron (ZOFRAN) injection 4 mg, 4 mg, Intravenous, Q8H PRN **OR** ondansetron (ZOFRAN) injection 8 mg, 8 mg, Intravenous, Q8H PRN, Short, Samuel, MD    oxyCODONE (ROXICODONE) immediate release tablet 5 mg, 5 mg, Oral, Q4H PRN, Mack Guise, MD    pantoprazole (Protonix) EC tablet 40 mg, 40 mg, Oral, Daily, Short, Samuel, MD    prochlorperazine (COMPAZINE) injection 2.5 mg, 2.5 mg, Intravenous, Q8H PRN, 2.5 mg at 07/01/23 0920 **OR** prochlorperazine (COMPAZINE) injection  5 mg, 5 mg, Intravenous, Q8H PRN, Short, Samuel, MD    QUEtiapine (SEROQUEL) tablet 100 mg, 100 mg, Oral, Nightly, Short, Samuel, MD, 100 mg at 07/01/23 0150    Current Outpatient Medications:     cholecalciferol, vitamin D3 25 mcg, 1,000 units,, 1,000 unit (25 mcg) tablet, Take 1 tablet (25 mcg total) by mouth daily., Disp: 30 tablet, Rfl: 0    dicyclomine (BENTYL) 10 mg capsule, Take 1 capsule (10 mg total) by mouth Four (4) times a day (before meals and nightly)., Disp: 120 capsule, Rfl: 3    empty container Misc, Use as directed, Disp: 1 each, Rfl: 3    HUMIRA PEN CITRATE FREE 40 MG/0.4 ML, Inject the contents of 1 pen (40 mg total) under the skin every seven (7) days., Disp: 4 each, Rfl: 5    OPTICHAMBER DIAMOND [facility] Spcr, , Disp: , Rfl:     pantoprazole (PROTONIX) 40 MG tablet, Take 1 tablet (40 mg total) by mouth daily., Disp: 90 tablet, Rfl: 0    polyethylene glycol (MIRALAX) 17 gram packet, Take 17 g (1 packet in 4-8 oz of liquid)  by mouth daily., Disp: 90 packet, Rfl: 3    QUEtiapine (SEROQUEL) 100 MG tablet, Take 1 tablet (100 mg total) by mouth nightly., Disp: , Rfl:     Allergies  Morphine and Toradol [ketorolac]    Family History   Problem Relation Age of Onset    Breast cancer Mother     Diabetes Maternal Aunt     Colon cancer Maternal Grandmother     Prostate cancer Maternal Grandfather     Cancer Paternal Grandmother     Crohn's disease Neg Hx        Social History  Social History     Tobacco Use    Smoking status: Every Day     Current packs/day: 0.30     Average packs/day: 0.5 packs/day for 21.7 years (10.3 ttl pk-yrs)     Types: Cigarettes     Start date: 10/02/2001     Passive exposure: Current    Smokeless tobacco: Never    Tobacco comments:     Reduced use from 10cpd to 5cpd, though relights each one. Motivated to quit.    Vaping Use    Vaping status: Some Days    Substances: Nicotine    Passive vaping exposure: Yes   Substance Use Topics    Alcohol use: No     Alcohol/week: 0.0 standard drinks of alcohol    Drug use: Yes     Types: Marijuana     Comment: Smokes 2-3 joints/day          Physical Exam       ED Triage Vitals   Enc Vitals Group      BP 06/30/23 1326 111/79      Heart Rate 06/30/23 1318 111      SpO2 Pulse 06/30/23 1326 97      Resp 06/30/23 1326 16      Temp 06/30/23 1326 37.1 ??C (98.7 ??F)      Temp Source 06/30/23 1326 Oral      SpO2 06/30/23 1318 98 %      Weight --       Height --       Head Circumference --       Peak Flow --       Pain Score --       Pain Loc --  Pain Education --       Exclude from Growth Chart --        Constitutional: Alert and oriented. Well appearing and in no distress.  Eyes: Conjunctivae are normal.  ENT       Head: Normocephalic and atraumatic.       Nose: No congestion.       Mouth/Throat: Mucous membranes are moist.       Neck: No stridor.  Hematological/Lymphatic/Immunilogical: No cervical lymphadenopathy.  Cardiovascular: Normal rate, regular rhythm. Normal and symmetric distal pulses are present in all extremities.  Respiratory: Normal respiratory effort. Breath sounds are normal.  Gastrointestinal: Diffusely tender R side of abdomen with right CVAT  Musculoskeletal: Normal range of motion in all extremities.       Right lower leg: No tenderness or edema.       Left lower leg: No tenderness or edema.  Neurologic: Normal speech and language. No gross focal neurologic deficits are appreciated.  Skin: Skin is warm, dry and intact. No rash noted.  Psychiatric: Mood and affect are normal. Speech and behavior are normal.       Radiology     CT Abdomen Pelvis W Contrast   Final Result   Similar-appearing inflammatory changes of the distal ileum with a focal area of narrowing in the periumbilical region and upstream mild dilation of small bowel. Findings are favored to be secondary to inflammatory ileitis with mild reactive ileus, although developing small bowel obstruction is not excluded.      Additional chronic and incidental findings as noted in the body of the report.      **Please note that the patient's first peripheral IV had infiltrated after the administration of contrast. Examination demonstrated a 5 x 3 cm subcutaneous collection along the right antecubital fossa. Patient reported mild pain but denied any paresthesias. Neurovascular exam intact. The study was able to be finished to completion without difficulty after placement of a second IV.       Potential, albeit low, for compartment syndrome, necrosis, and/or myositis/tenosynovitis due to the contrast were explained to the patient. The patient was informed to report to the radiology technologist/nurses if any symptoms progressed.             XR Chest Portable    (Results Pending)          Labs     Results for orders placed or performed during the hospital encounter of 06/30/23   Comprehensive Metabolic Panel   Result Value Ref Range    Sodium 140 135 - 145 mmol/L    Potassium 3.7 3.4 - 4.8 mmol/L    Chloride 107 98 - 107 mmol/L    CO2 27.0 20.0 - 31.0 mmol/L    Anion Gap 6 5 - 14 mmol/L    BUN 8 (L) 9 - 23 mg/dL    Creatinine 4.54 0.98 - 1.18 mg/dL    BUN/Creatinine Ratio 9     eGFR CKD-EPI (2021) Male >90 >=60 mL/min/1.40m2    Glucose 92 70 - 179 mg/dL    Calcium 8.2 (L) 8.7 - 10.4 mg/dL    Albumin 3.3 (L) 3.4 - 5.0 g/dL    Total Protein 6.6 5.7 - 8.2 g/dL    Total Bilirubin 0.6 0.3 - 1.2 mg/dL    AST 16 <=11 U/L    ALT 8 (L) 10 - 49 U/L    Alkaline Phosphatase 142 (H) 46 - 116 U/L   C-reactive protein   Result Value Ref Range  CRP 43.0 (H) <=10.0 mg/L   Sedimentation rate, manual   Result Value Ref Range    Sed Rate <1 0 - 15 mm/h   Lipase Level   Result Value Ref Range    Lipase 33 12 - 53 U/L   Lactate, Venous, Whole Blood   Result Value Ref Range    Lactate, Venous 2.2 (H) 0.5 - 1.8 mmol/L   Basic metabolic panel Result Value Ref Range    Sodium 138 135 - 145 mmol/L    Potassium 3.6 3.4 - 4.8 mmol/L    Chloride 105 98 - 107 mmol/L    CO2 21.0 20.0 - 31.0 mmol/L    Anion Gap 12 5 - 14 mmol/L    BUN 6 (L) 9 - 23 mg/dL    Creatinine 9.56 3.87 - 1.18 mg/dL    BUN/Creatinine Ratio 7     eGFR CKD-EPI (2021) Male >90 >=60 mL/min/1.17m2    Glucose 73 70 - 179 mg/dL    Calcium 7.7 (L) 8.7 - 10.4 mg/dL   Lactate, Venous, Whole Blood   Result Value Ref Range    Lactate, Venous 0.7 0.5 - 1.8 mmol/L   CBC w/ Differential   Result Value Ref Range    Results Verified by Slide Scan Slide Reviewed     WBC 5.6 3.6 - 11.2 10*9/L    RBC 5.05 4.26 - 5.60 10*12/L    HGB 14.2 12.9 - 16.5 g/dL    HCT 56.4 33.2 - 95.1 %    MCV 85.1 77.6 - 95.7 fL    MCH 28.2 25.9 - 32.4 pg    MCHC 33.1 32.0 - 36.0 g/dL    RDW 88.4 16.6 - 06.3 %    MPV      Platelet >124 (L) 150 - 450 10*9/L    Neutrophils % 44.8 %    Lymphocytes % 39.7 %    Monocytes % 13.9 %    Eosinophils % 1.2 %    Basophils % 0.4 %    Absolute Neutrophils 2.5 1.8 - 7.8 10*9/L    Absolute Lymphocytes 2.2 1.1 - 3.6 10*9/L    Absolute Monocytes 0.8 0.3 - 0.8 10*9/L    Absolute Eosinophils 0.1 0.0 - 0.5 10*9/L    Absolute Basophils 0.0 0.0 - 0.1 10*9/L   Urinalysis with Microscopy with Culture Reflex   Result Value Ref Range    Color, UA Yellow     Clarity, UA Clear     Specific Gravity, UA 1.026 1.003 - 1.030    pH, UA 6.5 5.0 - 9.0    Leukocyte Esterase, UA Negative Negative    Nitrite, UA Negative Negative    Protein, UA 30 mg/dL (A) Negative    Glucose, UA Negative Negative    Ketones, UA Negative Negative    Urobilinogen, UA 3.0 mg/dL (A) <0.1 mg/dL    Bilirubin, UA Negative Negative    Blood, UA Negative Negative    RBC, UA 2 <=3 /HPF    WBC, UA 1 <=2 /HPF    Squam Epithel, UA 2 0 - 5 /HPF    Bacteria, UA Rare (A) None Seen /HPF    Mucus, UA Occasional (A) None Seen /HPF       Pertinent labs & imaging results that were available during my care of the patient were reviewed by me and considered in my medical decision making (see chart for details).  Margaret Pyle [place], [place]  07/01/23 1101

## 2023-06-30 NOTE — Unmapped (Signed)
Pt c/o R flank pain x2 days.

## 2023-06-30 NOTE — Unmapped (Signed)
Pt with hx of Crohns coming in with 2 days of bilateral flank pain and nausea/vomiting. Denies burning with urination. Reports increased frequency with urination. +chills / subjective fevers at home.

## 2023-07-01 LAB — BASIC METABOLIC PANEL
ANION GAP: 12 mmol/L (ref 5–14)
BLOOD UREA NITROGEN: 6 mg/dL — ABNORMAL LOW (ref 9–23)
BUN / CREAT RATIO: 7
CALCIUM: 7.7 mg/dL — ABNORMAL LOW (ref 8.7–10.4)
CHLORIDE: 105 mmol/L (ref 98–107)
CO2: 21 mmol/L (ref 20.0–31.0)
CREATININE: 0.87 mg/dL (ref 0.73–1.18)
EGFR CKD-EPI (2021) MALE: >90 mL/min/{1.73_m2} (ref >=60)
GLUCOSE RANDOM: 73 mg/dL (ref 70–179)
POTASSIUM: 3.6 mmol/L (ref 3.4–4.8)
SODIUM: 138 mmol/L (ref 135–145)

## 2023-07-01 LAB — LACTATE, VENOUS, WHOLE BLOOD: LACTATE BLOOD VENOUS: 0.7 mmol/L (ref 0.5–1.8)

## 2023-07-01 MED ADMIN — lactated ringers bolus 500 mL: 500 mL | INTRAVENOUS | @ 04:00:00 | Stop: 2023-06-30

## 2023-07-01 MED ADMIN — lactated ringers bolus 1,000 mL: 1000 mL | INTRAVENOUS | @ 07:00:00 | Stop: 2023-07-01

## 2023-07-01 MED ADMIN — oxyCODONE (ROXICODONE) immediate release tablet 5 mg: 5 mg | ORAL | @ 16:00:00 | Stop: 2023-07-01

## 2023-07-01 MED ADMIN — HYDROmorphone (PF) (DILAUDID) injection Syrg 0.5 mg: .5 mg | INTRAVENOUS | @ 11:00:00 | Stop: 2023-07-01

## 2023-07-01 MED ADMIN — promethazine (PHENERGAN) 12.5 mg in sodium chloride (NS) 0.9 % 50 mL IVPB: 12.5 mg | INTRAVENOUS | @ 06:00:00 | Stop: 2023-07-01

## 2023-07-01 MED ADMIN — HYDROmorphone (PF) (DILAUDID) injection Syrg 0.5 mg: .5 mg | INTRAVENOUS | @ 07:00:00 | Stop: 2023-07-01

## 2023-07-01 MED ADMIN — pantoprazole (Protonix) EC tablet 40 mg: 40 mg | ORAL | @ 16:00:00 | Stop: 2023-07-01

## 2023-07-01 MED ADMIN — iohexol (OMNIPAQUE) 350 mg iodine/mL solution 100 mL: 100 mL | INTRAVENOUS | @ 04:00:00 | Stop: 2023-06-30

## 2023-07-01 MED ADMIN — QUEtiapine (SEROQUEL) tablet 100 mg: 100 mg | ORAL | @ 07:00:00 | Stop: 2023-07-01

## 2023-07-01 MED ADMIN — promethazine (PHENERGAN) 12.5 mg in sodium chloride (NS) 0.9 % 50 mL IVPB: 12.5 mg | INTRAVENOUS | @ 01:00:00 | Stop: 2023-06-30

## 2023-07-01 MED ADMIN — HYDROmorphone (PF) (DILAUDID) injection 0.5 mg: .5 mg | INTRAVENOUS | @ 04:00:00 | Stop: 2023-06-30

## 2023-07-01 MED ADMIN — prochlorperazine (COMPAZINE) injection 2.5 mg: 2.5 mg | INTRAVENOUS | @ 14:00:00 | Stop: 2023-07-01

## 2023-07-01 MED ADMIN — HYDROmorphone (PF) (DILAUDID) injection 0.5 mg: .5 mg | INTRAVENOUS | @ 05:00:00 | Stop: 2023-06-30

## 2023-07-01 MED ADMIN — HYDROmorphone (PF) (DILAUDID) injection 0.5 mg: 0.5 mg | INTRAVENOUS | @ 03:00:00 | Stop: 2023-06-30

## 2023-07-01 MED ADMIN — enoxaparin (LOVENOX) syringe 40 mg: 40 mg | SUBCUTANEOUS | @ 14:00:00 | Stop: 2023-07-01

## 2023-07-01 NOTE — Unmapped (Signed)
Internal Medicine (MEDL) History & Physical    Assessment & Plan:   Jerome Kennedy. is a 47 y.o. 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     Principal Problem:    Abdominal pain  Active Problems:    Crohn's disease (CMS-HCC)    C. difficile colitis    Bipolar 1 disorder (CMS-HCC)    COPD (chronic obstructive pulmonary disease) (CMS-HCC)        Active Problems    Abdominal pain, Crohns flare vs recurrent C dif  N/V/Diarrhea  Multiple ED presentations in last month with recurrent intractable nausea/vomitting/and worsening of chronic diarrhea with yellow/seedy nature. CT imaging concerning for acute inflammation which agrees with CRP of 42, but hard to discern flare vs C dif recurrence. Given inability to connect with outpatient providers, would favor [facility] admission for expedient workup to preserve integrity of small remaninig bowel. SBO queried on CT read, but having bowel movements so this seems less likely. Will defer further immunosuppresion vs abx to day team/GI given stability at this time and  focus on symptoms tonight. Has been taking humira on Saturdays and is able to access this.  - GI consult in AM, can consider colonoscopy while inpt given difficulties with outpatient scheduling  - antinausea: prn compazine and zofran  - pain: tylenol and prn dilaudid  - giving another 1L fluids with recheck of lactate in AM  - fecal calprotectin  - GIPP  - home bentyl    R arm IV contrast extravasation  No pain on exam.  - will remain vigilant for muscle injury/compartment syndrome    Chronic Problems    Mood: home quetiapine  GERD: restarting prior protonix (was out of rx)  COPD: I'm not able to find any prior PFTs, uncertain where this dx comes from or if accurate. Will defer to day team.    Checklist:  Diet: Regular Diet  DVT PPx: Lovenox 40mg  q24h  Code Status: Full Code  Dispo: Patient appropriate for Observation based on expectation at time of admission that period of observation will be less than 30 hours    Team Contact Information:   Primary Team: Internal Medicine (MEDL)  Primary Resident: Vonna Kotyk, MD  Resident's Pager: [phone] [facility] Senior Resident)    Chief Concern:   Abdominal pain      Subjective:   Jerome Kennedy. is a 47 y.o. male with pertinent PMHx as above presenting with pain, nausea, and vomitting.          History obtained by Jerome Kennedy.       HPI:  Jerome. Christella Kennedy has had a really rough last few weeks.  He is presents to the emergency room multiple times for intractable nausea, vomiting, and diarrhea associated with abdominal pain.  He has also been having fevers and chills at home.  His stools have been more loose and seedy and yellow than usual.  He has also been having green to yellow emesis with mucus mixed in; there is no mucus in his stool.  He has been trying to see his GI doctor outpatient, but has been having a lot of difficulties getting to appointments due to his pain.  Based on this, he decided to represent to the hospital today.    ED  course  Vitals: HDS, afebirle  Pertinent labs: stable CBC and chemistries, CRP 43, lactate 2.2  Pertinent imaging: CT A/P with distal ileum inflammatory changes vs SBO  Interventions: 500 LR, pain medication    At bedside, he is in substantial pain but current medications have been helpful.  The IV antiemetics are also helpful.    He ran out of his usual medicines last Saturday, but has previously been able to access them okay.  Notably, he takes Humira on Saturdays and did have this last week.    Allergies  Morphine and Toradol [ketorolac]    I reviewed the Medication List. The current list is Accurate  Prior to Admission medications    Medication Dose, Route, Frequency   cholecalciferol, vitamin D3 25 mcg, 1,000 units,, 1,000 unit (25 mcg) tablet 25 mcg, Oral, Daily (standard)   dicyclomine (BENTYL) 10 mg capsule 10 mg, Oral, 4 times a day (ACHS)   empty container Misc Use as directed   HUMIRA PEN CITRATE FREE 40 MG/0.4 ML Inject the contents of 1 pen (40 mg total) under the skin every seven (7) days.   OPTICHAMBER DIAMOND [facility] Spcr    pantoprazole (PROTONIX) 40 MG tablet 40 mg, Oral, Daily (standard)   polyethylene glycol (MIRALAX) 17 gram packet Take 17 g (1 packet in 4-8 oz of liquid)  by mouth daily.   QUEtiapine (SEROQUEL) 100 MG tablet Take 1 tablet (100 mg total) by mouth nightly.       Designated Environmental health practitioner:  Jerome. Christella Kennedy currently has decisional capacity for healthcare decision-making and is able to designate a surrogate healthcare decision maker. Jerome. Birdena Crandall designated healthcare decision maker(s) is/are [place] Parker(the patient's other adult) as denoted by stated patient preference.    Objective:   Physical Exam:  Temp:  [37.1 ??C (98.7 ??F)-37.5 ??C (99.5 ??F)] 37.5 ??C (99.5 ??F)  Heart Rate:  [97-111] 97  SpO2 Pulse:  [83-97] 83  Resp:  [16-18] 18  BP: (110-111)/(76-79) 110/76  SpO2:  [98 %-100 %] 100 %    Gen: NAD, converses   Eyes: Sclera anicteric, EOMI grossly normal   HENT: Atraumatic, normocephalic  Neck: Trachea midline  Heart: RRR  Lungs: CTAB, no crackles or wheezes  Abdomen: nondistended,  TTP in RLQ, impressive ventral hernia at baseline per Jerome Kennedy  Extremities: No edema  Neuro: Grossly symmetric, non-focal    Skin:  No rashes, lesions on clothed exam  Psych: Alert, oriented    Vonna Kotyk, MD  Internal Medicine PGY-2

## 2023-07-01 NOTE — Unmapped (Signed)
[facility]  Emergency Department Progress Note    June 30, 2023 11:53 PM    I supervised care provided by the resident/APP. We have discussed the case, I have reviewed the note and I agree with the plan of treatment.      ED care from [place], [place] at 2100.     Medical Decision Making and Progress Notes     ED Course as of 06/30/23 2354   Tue Jun 30, 2023   [id number] 47 year old male history of Crohn's disease presenting with acute ongoing abdominal pain with associated nausea and vomiting signed out to me pending CT imaging.  CT imaging with inflammatory changes but no evidence of complete bowel obstruction.  Patient with persistent intractable nausea and vomiting despite several rounds of medication.  Plan for admission for further management.         Additional Progress Notes and Critical Care    NA    Portions of this record have been created using Dragon dictation software. Dictation errors have been sought, but may not have been identified and corrected.      ED Clinical Impression     Final diagnoses:   Crohn's disease with complication, unspecified gastrointestinal tract location (CMS-HCC) (Primary)

## 2023-07-01 NOTE — Unmapped (Signed)
Luminal Gastroenterology Consult Service   Initial Consultation         Assessment and Recommendations:   Jerome Kennedy. is a 47 y.o. 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  This has been complicated by short gut syndrome, high ostomy output for which he underwent ileostomy takedown and reanastomosis 2 remaining colon who presented to [facility] with vomiting, diarrhea and abdominal pain. The patient is seen in consultation at the request of Mack Guise, MD ([facility] for  chronic abdominal pain .    Assessment: 47 year old gentleman with complex ileocolonic Crohn's disease status post prior ileocolonic resections, prior ileostomy now with reanastomosis on weekly Humira here with acute on chronic right lower quadrant pain.  Of note, he has been seen by the GI consult service multiple times, most recently February of this year where he underwent a scope with an SES-CD of 0.  Although his CRP is elevated today compared to prior, we suspect the majority of his symptoms is due to chronic functional abdominal pain rather than active Crohn's disease.  He has had solid stools in the past 48 hours, which is at his baseline.  He does endorse about 3 days of acute upper respiratory symptoms which may be driving his elevated CRP.  Would not recommend any change in management from Crohn's disease standpoint while admitted.  He should follow-up outpatient with his primary gastroenterologist (I can request follow-up) and he should schedule colonoscopy outpatient as previously ordered.    Recommendations:  - Do not recommend any change in management from Crohn's disease standpoint as he has no signs of a flare  - Suspect mildly elevated CRP is in the setting of acute upper respiratory infections  - Will request outpatient follow-up with Dr. [name]  - Advised patient that he needs to call and schedule his colonoscopy as previously ordered by his primary gastroenterologist  -He should continue weekly Humira      Issues Impacting Complexity of Management:  -None    Recommendations discussed with the patient's primary team.  Patient left AMA.    Subjective:   This is a 47 year old gentleman who is well-known to [place] in GI clinic.  About 1 year ago in December 2023    He presented for acute on chronic crampy abdominal pain.  His CT at the time showed evidence of circumferential bowel wall thickening and possible narrowing with focal upstream dilation.  He was treated supportively and discharged did not undergo any endoscopic evaluation at that time.  Was recommended to continue on weekly Humira.    Then presented again in late February for similar symptoms.  To restage him, he underwent both an upper endoscopy and colonoscopy during that admission and the colonoscope was advanced to the transverse colon (ileocolonic anastomosis).  However, the exam was normal and there is no evidence of active Crohn's disease.  Was discharged and recommended to continue weekly Humira.    Since then, he has been seen in the ED at least 15 additional times this year, mostly for right lower quadrant crampy abdominal pain.  Several of these visits he did receive CT scans with IV contrast which showed stable wall thickening and increased wall enhancement of the neoterminal ileum (August, September and November).     He was last seen in GI clinic by Dr. [name]  October 22 of this year where he continued to complain of abdominal pain and nausea.  Plan at the time was to repeat endoscopic evaluation to confirm no evidence of active Crohn's disease, however he has not yet gotten this scheduled.    In the past 2 days, he comes back with nausea, vomiting and diarrhea along with chills at home.  He says his stools have been more loose and seedy than usual. Diarrhea has resolved in the past 24 hours.  Only had 1 stool yesterday which was solid.  Today, has not had a bowel movement yet.  Pain feels better today.    Objectively, Tmax 37.3 with otherwise normal vital signs.  Labs show stable CBC and chemistry, only notable for albumin 3.3.  CRP is elevated to 43 (has been intermittently elevated over the past several months to around 12-30).    -I have reviewed the patient's prior records from prior hospitilizations as summarized in the HPI    Objective:   Temp:  [37.1 ??C (98.7 ??F)-37.5 ??C (99.5 ??F)] 37.3 ??C (99.1 ??F)  Heart Rate:  [97-111] 108  SpO2 Pulse:  [83-97] 93  Resp:  [16-18] 18  BP: (110-123)/(67-79) 110/67  SpO2:  [94 %-100 %] 96 %    Gen: Chronically ill-appearing gentleman in no acute distress.  Abdomen: Soft, NTND, no rebound/guarding, no hepatosplenomegaly.  Ventral hernia near umbilicus.  Reducible  Extremities: No edema in the BLEs    Pertinent Labs/Studies:  Reviewed pertinent labs from 11/27 which show slightly elevated CRP.

## 2023-07-01 NOTE — Unmapped (Signed)
[facility]  Telephone Note         Recommendations:   Jerome Kennedy. is a 47 y.o. male with Crohn's disease of small and large intestine (dx 1990's) s/p ileocecectomy (2003) c/b SBO 2/2 to stricturing disease s/p right hemicolectomy and end-ileostomy (08/2019) complicated by short gut syndrome and high ostomy output s/p colostomy takedown and reanastomosis (10/2019), ventral abdominal hernia and incisional hernia s/p repair (2004), recurrent CDI (s/p PO vancomycin 04/2022). I was called by the ED for recommendations regarding this patients  abdominal pain .    Patient has presented 3 times in the last month for abdominal pain. At last ED visit 11/20, pain felt to be chronic and plan was for outpatient scope and follow-up. Returned to ED today complaining of RUQ and flank pain. He has also been having nausea/vomiting, looser nonbloody stool (3-4BM/day), and chills. ED wondering what our thoughts were on admitting this patient in setting of representation and ongoing pain. Since presentation to the ED, he has remained afebrile, VSS, labs overall unremarkable except for CRP 43 <- 13 11/20 <- 7 11/11. ALP chronically elevated. CT a/p pending at time of page.     Has been seen by Dr. [name] in October who ordered a colonoscopy that has yet to be scheduled (attempted to call patient multiple times, most recently 11/21). Documentation from this visit also commented that CT scans have demonstrated thickening of neo-TI, but suspect this finding is chronic rather than representing active disease given presence of similar findings on CT in 09/2021 with colonoscopy at the time showing patent anastamosis with no active disease. Last colonoscopy 09/2022 SES-CD 0.     Discussed with ED that ultimately decision to admit is up to them. Given representation 3 times over the last several months, it is reasonable to admit for further workup, particularly as there has been difficulty scheduling colonoscopy outpatient; however, if symptoms are able to be controlled and CT without dangerous findings, it is also reasonable to pursue workup in an outpatient setting. Advised if patient is admitted, to get [facility] as well as consider infectious stool studies if diarrhea significant (ED providers to determine).    Should patient be admitted, the admitting team should please page GI luminal consult team for full consult in the morning.    I have neither seen nor examined this patient, I have only discussed the patient with the requesting provider and briefly reviewed the patient's chart. I have provided the requesting provider with the above recommendations based on this limited evaluation. Ultimately, the final decision making regarding the patient's care lies with the requesting provider.

## 2023-07-01 NOTE — Unmapped (Signed)
Physician Discharge Summary [facility]  [facility] EMERGENCY DEPARTMENT  [address]  [place] [address]  Dept: [phone]  Loc: [phone]     Identifying Information:   Jerome Kennedy  February 14, 1976  [id number]    Primary Care Physician: Aleen Campi, MD     Code Status: Full Code    Admit Date: 06/30/2023    Discharge Date: 07/01/2023     Discharge To: Against Medical Advice    Discharge Service: [facility] - General Medicine Floor Team MED Rhae Lerner     Discharge Attending Physician: No att. providers found    Discharge Diagnoses:   Principal Problem:    Abdominal pain (POA: Yes)  Active Problems:    Crohn's disease (CMS-HCC) (POA: Yes)    C. difficile colitis (POA: Yes)    Bipolar 1 disorder (CMS-HCC) (POA: Yes)    COPD (chronic obstructive pulmonary disease) (CMS-HCC) (POA: Yes)  Resolved Problems:    * No resolved hospital problems. [facility] Course:   Jerome Kennedy. is a 47 y.o. 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  This has been complicated by short gut syndrome, high ostomy output for which he underwent ileostomy takedown and reanastomosis 2 remaining colon who presented to [facility] with vomiting, diarrhea and abdominal pain. Patient was evaluated by GI and discharged himself from the hospital against medical advice.     Abdominal pain  N/V/Diarrhea  Multiple ED presentations in last month with recurrent intractable nausea/vomitting/and worsening of chronic diarrhea with yellow/seedy nature. CT imaging concerning for chronic inflammation and CRP elevated to 42. Admitted for further work-up given trouble connecting with outpatient providers and abdominal pain.  SBO queried on CT read, but having bowel movements so this seems less likely. GI consulted and believe most of his symptoms are due to chronic functional abdominal pain not acute chron's flare. C. Diff, fecal cal protectin, GIPP pending. GI recommend outpatient colonoscopy and to continue weekly Humira. Pt also was treated with antinausea meds, tylenol, fluids, and home bentyl.     Productive cough  Pt with productive cough for 3 days, likely viral URI. Planned to check RPP and lower resp cx. CXR negative.      R arm IV contrast extravasation  No pain on exam.     Chronic Problems  Mood: Continue home quetiapine  GERD: Restarted prior protonix as patient had recently run out.  COPD: Previously taking symbicort and albuterol.   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    The patient's hospital stay has been complicated by the following clinically significant conditions requiring additional evaluation and treatment or having a significant effect of this patient's care: - Dehydration POA requiring further investigation, treatment, or monitoring     Outpatient Provider Follow Up Issues:   [ ]  outpatient colonoscopy  [ ]  smoking cessation f/up    Touchbase with Outpatient Provider:  Warm Handoff: Completed on 07/01/23 by Argentina Donovan, MD  (Intern) via [facility] Message    Procedures:  None  ______________________________________________________________________  Discharge Medications:      Your Medication List        STOP taking these medications      oxyCODONE 5 MG immediate release tablet  Commonly known as: ROXICODONE            CONTINUE taking  these medications      cholecalciferol (vitamin D3 25 mcg (1,000 units)) 1,000 unit (25 mcg) tablet  Take 1 tablet (25 mcg total) by mouth daily.     CLEARLAX 17 gram packet  Generic drug: polyethylene glycol  Take 17 g (1 packet in 4-8 oz of liquid)  by mouth daily.     dicyclomine 10 mg capsule  Commonly known as: BENTYL  Take 1 capsule (10 mg total) by mouth Four (4) times a day (before meals and nightly).     empty container Misc  Use as directed     HUMIRA(CF) PEN 40 mg/0.4 mL injection  Generic drug: adalimumab  Inject the contents of 1 pen (40 mg total) under the skin every seven (7) days.     OPTICHAMBER DIAMOND VHC Spcr  Generic drug: inhalational spacing device     pantoprazole 40 MG tablet  Commonly known as: Protonix  Take 1 tablet (40 mg total) by mouth daily.     QUEtiapine 100 MG tablet  Commonly known as: SEROQUEL  Take 1 tablet (100 mg total) by mouth nightly.              Allergies:  Morphine and Toradol [ketorolac]  ______________________________________________________________________  Pending Test Results:      Most Recent Labs:  All lab results last 24 hours -   Recent Results (from the past 24 hours)   Basic metabolic panel    Collection Time: 07/01/23  7:13 AM   Result Value Ref Range    Sodium 138 135 - 145 mmol/L    Potassium 3.6 3.4 - 4.8 mmol/L    Chloride 105 98 - 107 mmol/L    CO2 21.0 20.0 - 31.0 mmol/L    Anion Gap 12 5 - 14 mmol/L    BUN 6 (L) 9 - 23 mg/dL    Creatinine 7.82 9.56 - 1.18 mg/dL    BUN/Creatinine Ratio 7     eGFR CKD-EPI (2021) Male >90 >=60 mL/min/1.16m2    Glucose 73 70 - 179 mg/dL    Calcium 7.7 (L) 8.7 - 10.4 mg/dL   Lactate, Venous, Whole Blood    Collection Time: 07/01/23  7:14 AM   Result Value Ref Range    Lactate, Venous 0.7 0.5 - 1.8 mmol/L       Relevant Studies/Radiology:  XR Chest Portable  Result Date: 07/01/2023  EXAM: XR CHEST PORTABLE ACCESSION: [id number] UN CLINICAL INDICATION: COUGH  TECHNIQUE: Single View AP Chest Radiograph. COMPARISON: 10/29/2022 FINDINGS: Lungs are clear.  No pleural effusion or pneumothorax. Normal heart size and mediastinal contours.     No acute airspace disease.    CT Abdomen Pelvis W Contrast  Result Date: 06/30/2023  EXAM: CT ABDOMEN PELVIS W CONTRAST ACCESSION: [id number] UN CLINICAL INDICATION: 47 years old with R flank pain, n/v, hx crohns  COMPARISON: CT 06/24/2023 TECHNIQUE: A helical CT scan of the abdomen and pelvis was obtained following IV contrast from the lung bases through the pubic symphysis. Images were reconstructed in the axial plane. Coronal and sagittal reformatted images were also provided for further evaluation. FINDINGS: LOWER CHEST: Bibasilar atelectasis. Left and right lower lobe tree-in-bud opacities and bronchial wall thickening. LIVER: Normal liver contour. No focal liver lesions. BILIARY: The gallbladder is surgically absent. No biliary ductal dilatation.  SPLEEN: Normal in size and contour. PANCREAS: Normal pancreatic contour.  No focal lesions.  No ductal dilation. ADRENAL GLANDS: Normal appearance of the adrenal glands. KIDNEYS/URETERS: Symmetric renal enhancement.  No hydronephrosis.  No solid  renal mass. BLADDER: Contrast within the bladder. REPRODUCTIVE ORGANS: Unremarkable. GI TRACT: The stomach is unremarkable. Postsurgical changes of right hemicolectomy and ileocecectomy with ileocolic anastomosis. There is thickening of a loop of small bowel with prominent mucosal enhancement in the right hemiabdomen and periumbilical region with interval development of a focal area of narrowing (2:87). There is mild upstream small bowel dilation measuring up to 4.2 cm in diameter (3:32). The appendix is not visualized. PERITONEUM, RETROPERITONEUM AND MESENTERY: No free air.  No ascites.  No fluid collection. LYMPH NODES: Multiple prominent mesenteric lymph nodes, likely reactive and similar to prior. VESSELS: Hepatic and portal veins are patent. Heterogeneous opacification of the bilateral common iliac and external iliac veins, likely mixing artifact. Normal caliber aorta. Extensive venous collaterals in the anterior and lateral abdominal walls. BONES and SOFT TISSUES: Multilevel degenerative changes of the spine. Rectus diastases. No focal soft tissue lesions.     Similar-appearing inflammatory changes of the distal ileum with a focal area of narrowing in the periumbilical region and upstream mild dilation of small bowel. Findings are favored to be secondary to inflammatory ileitis with mild reactive ileus, although developing small bowel obstruction is not excluded. Additional chronic and incidental findings as noted in the body of the report. **Please note that the patient's first peripheral IV had infiltrated after the administration of contrast. Examination demonstrated a 5 x 3 cm subcutaneous collection along the right antecubital fossa. Patient reported mild pain but denied any paresthesias. Neurovascular exam intact. The study was able to be finished to completion without difficulty after placement of a second IV.  Potential, albeit low, for compartment syndrome, necrosis, and/or myositis/tenosynovitis due to the contrast were explained to the patient. The patient was informed to report to the radiology technologist/nurses if any symptoms progressed.    ______________________________________________________________________  Discharge Instructions:                    Appointments which have been scheduled for you      Jul 14, 2023  ARTHRODESIS, METACARPOPHALANGEAL JOINT, WITH OR WITHOUT INTERNAL FIXATION with [name], MD  [facility] PERIOP [facility] SURGERY [facility] REGION) [address]  [address] Floor  [place] [address]  [phone]          ______________________________________________________________________  Discharge Day Services:  BP 110/67  - Pulse 108  - Temp 37.3 ??C (99.1 ??F)  - Resp 18  - SpO2 96%     Pt seen on the day of discharge and determined appropriate for discharge.    Condition at Discharge: fair    Length of Discharge: I spent less than 30 mins in the discharge of this patient.

## 2023-07-01 NOTE — Unmapped (Signed)
Bed: 76-D  Expected date:   Expected time:   Means of arrival:   Comments:  Team C

## 2023-07-01 NOTE — Unmapped (Addendum)
Jerome Kennedy. is a 47 y.o. 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  This has been complicated by short gut syndrome, high ostomy output for which he underwent ileostomy takedown and reanastomosis 2 remaining colon who presented to [facility] with vomiting, diarrhea and abdominal pain. Patient was evaluated by GI and discharged himself from the hospital against medical advice.     Abdominal pain  N/V/Diarrhea  Multiple ED presentations in last month with recurrent intractable nausea/vomitting/and worsening of chronic diarrhea with yellow/seedy nature. CT imaging concerning for chronic inflammation and CRP elevated to 42. Admitted for further work-up given trouble connecting with outpatient providers and abdominal pain.  SBO queried on CT read, but having bowel movements so this seems less likely. GI consulted and believe most of his symptoms are due to chronic functional abdominal pain not acute chron's flare. C. Diff, fecal cal protectin, GIPP pending. GI recommend outpatient colonoscopy and to continue weekly Humira. Pt also was treated with antinausea meds, tylenol, fluids, and home bentyl.     Productive cough  Pt with productive cough for 3 days, likely viral URI. Planned to check RPP and lower resp cx. CXR negative.      R arm IV contrast extravasation  No pain on exam.     Chronic Problems  Mood: Continue home quetiapine  GERD: Restarted prior protonix as patient had recently run out.  COPD: Previously taking symbicort and albuterol.   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

## 2023-07-01 NOTE — Unmapped (Signed)
Pt informed RN that he has things to handle and needs to leave immediately. RN encouraged pt to stay for the full duration of his hospital admission and educated on the importance of continuous hospital monitoring. RN also asked pt to try and wait for admitting  team to come and access before leaving. Pt agreed but requested IV to be taken out. MD made aware

## 2023-07-13 MED ORDER — OXYCODONE 5 MG TABLET
ORAL_TABLET | ORAL | 0 refills | 2.00 days | PRN
Start: 2023-07-13 — End: 2023-07-18

## 2023-07-22 NOTE — Unmapped (Signed)
Colonoscopy  Procedure #1     Procedure #2   [id number]  MRN   IBD  Endoscopist     Is the patient's health insurance [employer], [employer], [facility]), [employer] [place], [employer], or [place]?     Urgent procedure     Are you pregnant?     Are you in the process of scheduling or awaiting results of a heart ultrasound, stress test, or catheterization to evaluate new or worsening chest pain, dizziness, or shortness of breath?     Do you take: Plavix (clopidogrel), Coumadin (warfarin), Lovenox (enoxaparin), Pradaxa (dabigatran), Effient (prasugrel), Xarelto (rivaroxaban), Eliquis (apixaban), Pletal (cilostazol), or Brilinta (ticagrelor)?          Did ordering provider indicate how long to hold this medication in the order comments?          Which of the above medications are you taking?          What is the name of the medical practice that manages this medication?          What is the name of the medical provider who manages this medication?     Do you have hemophilia, von Willebrand disease, or low platelets?     Do you have a pacemaker or implanted cardiac defibrillator?     Has a [facility] provider specified the location(s)?     Which location(s) did the [facility] provider specify?          [facility]          [place]          [facility]     Do you see a liver specialist for chronic liver disease?     Is the procedure indication for variceal screening?     Is procedure indication for variceal banding (this does NOT include variceal screening)?     Have you had a heart attack, stroke or heart stent placement within the past 6 months?     Month of event     Year of event (ONLY ENTER LAST 2 DIGITS)        5  Height (feet)   7  Height (inches)   149  Weight (pounds)   23.3  BMI          Did the ordering provider specify a bowel prep?          What bowel prep was specified?     Do you have an ostomy (bag on your stomach that collects your stool)?          Is it an ileostomy?          Is it a colostomy? Patient doesn't know.     Do you have chronic kidney disease?     Do you have chronic constipation or have you had poor quality bowel preps for past colonoscopies?   TRUE  Do you have Crohn's disease or ulcerative colitis?     Have you had weight loss surgery?          When you walk around your house or grocery store, do you have to stop and rest due to shortness of breath, chest pain, or light-headedness?     Do you ever use supplemental oxygen?     Have you been hospitalized for cirrhosis of the liver or heart failure in the last 12 months?     Have you been treated for mouth or throat cancer with radiation or surgery?  Have you been told that it is difficult for doctors to insert a breathing tube in you during anesthesia?     Have you had a heart or lung transplant?          Are you on dialysis?     Do you have cirrhosis of the liver?     Do you have myasthenia gravis?     Is the patient a prisoner?   ################# ## ###################################################################################################################   MRN:  [id number]   Anticoag Review No   Nurse Triage  No   GI clinic consult No   Procedure(s):  Colonoscopy     0   Endoscopist:  IBD   Urgent:  No   Prep:  Miralax Prep                  --------------------------- --- ----------------------------------------------------------------------------------------------------------------------------------------------------------------------------   G3 Locations:  [facility]     [facility]     [place]        Requested Locations:              ################# ## ###################################################################################################################

## 2023-07-22 NOTE — Unmapped (Signed)
[facility] Refill Coordination Note    Specialty Medication(s) to be Shipped:   Inflammatory Disorders: Humira    Other medication(s) to be shipped: No additional medications requested for fill at this time       Jerome Kennedy., DOB: 1976/05/31  Phone: [phone] (home)       All above HIPAA information was verified with patient.     Was a Nurse, learning disability used for this call? No    Completed refill call assessment today to schedule patient's medication shipment from the [facility]  [phone]).  All relevant notes have been reviewed.     Specialty medication(s) and dose(s) confirmed: Regimen is correct and unchanged.   Changes to medications: Marcelo reports no changes at this time.  Changes to insurance: No  New side effects reported not previously addressed with a pharmacist or physician: None reported  Questions for the pharmacist: No    Confirmed patient received a Conservation officer, historic buildings and a Surveyor, mining with first shipment. The patient will receive a drug information handout for each medication shipped and additional FDA Medication Guides as required.       DISEASE/MEDICATION-SPECIFIC INFORMATION        For patients on injectable medications: Patient currently has 0 doses left.  Next injection is scheduled for this week.    SPECIALTY MEDICATION ADHERENCE     Medication Adherence    Patient reported X missed doses in the last month: 0  Specialty Medication: HUMIRA(CF) PEN 40 mg/0.4 mL  Patient is on additional specialty medications: No              Were doses missed due to medication being on hold?  No    Humira 40/0.4 mg/ml: 0 doses of medicine on hand        REFERRAL TO PHARMACIST     Referral to the pharmacist: Not needed      [facility]     Shipping address confirmed in Epic.       Delivery Scheduled: Yes, Expected medication delivery date: 07/24/23.     Medication will be delivered via Same Day Courier to the prescription address in Epic [place].    [name]   [facility]  Specialty Technician

## 2023-07-24 MED FILL — HUMIRA PEN CITRATE FREE 40 MG/0.4 ML: SUBCUTANEOUS | 28 days supply | Qty: 4 | Fill #1

## 2023-08-14 NOTE — Unmapped (Signed)
Spoke with Mr. Wanless regarding upcoming GI procedure on 08/17/23 at [facility] location at 0830 with an arrival time of 0730.     Informed patient prep instructions were sent via MyChart/email/US Mail on 07/22/23.  Patient had not yet read instructions, reviewed pertinent information and advised to read instructions as soon as able. Answered questions regarding diet and prep process and provided number for future questions if they arise.    Verified prep medications ordered/received.    Reviewed information regarding holding vitamins/supplements/oil based items/iron containing products the day before and day of procedure. All other approved medications should be taken at least two hours prior to appointment time.      Reviewed low fiber diet should have started three days prior to procedure.    Reviewed LIQUID diet requirement the entire day prior to procedure and NOTHING AT ALL 2 hours prior to procedure    Reviewed driver requirement (18 or older, must be able to drive patient home, NO UBER OR LYFT FOR PICK-UP, MUST SIGN DISCHARGE PAPERS, must remain within 20 minutes of facility and reachable by cell phone). Patient stated if his friend couldn't bring him he would need to reschedule. Reiterated that if he did not have a driver his procedure wold not be done. Attempted to provide number for schedulers, patient disconnected before number given.    No further questions at this time.

## 2023-08-27 ENCOUNTER — Inpatient Hospital Stay: Admit: 2023-08-27 | Discharge: 2023-08-27 | Payer: PRIVATE HEALTH INSURANCE

## 2023-08-27 ENCOUNTER — Encounter
Admit: 2023-08-27 | Discharge: 2023-08-27 | Payer: PRIVATE HEALTH INSURANCE | Attending: Anesthesiology | Primary: Anesthesiology

## 2023-08-27 ENCOUNTER — Ambulatory Visit: Admit: 2023-08-27 | Discharge: 2023-08-27 | Payer: PRIVATE HEALTH INSURANCE

## 2023-08-27 MED ORDER — OXYCODONE 5 MG TABLET
ORAL_TABLET | ORAL | 0 refills | 3.00 days | Status: CP | PRN
Start: 2023-08-27 — End: 2023-08-27

## 2023-08-27 MED ADMIN — ceFAZolin (ANCEF) IVPB 2 g in 50 ml dextrose (premix): 2 g | INTRAVENOUS | @ 18:00:00 | Stop: 2023-08-27

## 2023-08-27 MED ADMIN — phenylephrine 1 mg/10 mL (100 mcg/mL) injection Syrg: INTRAVENOUS | @ 18:00:00 | Stop: 2023-08-27

## 2023-08-27 MED ADMIN — Propofol (DIPRIVAN) injection: INTRAVENOUS | @ 18:00:00 | Stop: 2023-08-27

## 2023-08-27 MED ADMIN — sodium chloride irrigation (NS) 0.9 % irrigation solution: @ 18:00:00 | Stop: 2023-08-27

## 2023-08-27 MED ADMIN — fentaNYL (PF) (SUBLIMAZE) injection 25 mcg: 25 ug | INTRAVENOUS | @ 20:00:00 | Stop: 2023-08-27

## 2023-08-27 MED ADMIN — Propofol (DIPRIVAN) injection: INTRAVENOUS | @ 19:00:00 | Stop: 2023-08-27

## 2023-08-27 MED ADMIN — lidocaine (PF) (XYLOCAINE-MPF) 20 mg/mL (2 %) injection: INTRAVENOUS | @ 18:00:00 | Stop: 2023-08-27

## 2023-08-27 MED ADMIN — oxyCODONE (ROXICODONE) immediate release tablet 5 mg: 5 mg | ORAL | @ 20:00:00 | Stop: 2023-08-27

## 2023-08-27 MED ADMIN — fentaNYL (PF) (SUBLIMAZE) injection: INTRAVENOUS | @ 19:00:00 | Stop: 2023-08-27

## 2023-08-27 MED ADMIN — ondansetron (ZOFRAN) injection: INTRAVENOUS | @ 19:00:00 | Stop: 2023-08-27

## 2023-08-27 MED ADMIN — acetaminophen (TYLENOL) tablet 1,000 mg: 1000 mg | ORAL | @ 18:00:00 | Stop: 2023-08-27

## 2023-08-27 MED ADMIN — dexAMETHasone (DECADRON) 4 mg/mL injection: INTRAVENOUS | @ 18:00:00 | Stop: 2023-08-27

## 2023-08-27 MED ADMIN — fentaNYL (PF) (SUBLIMAZE) injection 25 mcg: 25 ug | INTRAVENOUS | @ 19:00:00 | Stop: 2023-08-27

## 2023-08-27 MED ADMIN — midazolam (VERSED) injection: INTRAVENOUS | @ 18:00:00 | Stop: 2023-08-27

## 2023-08-27 MED ADMIN — propofol (DIPRIVAN) infusion 10 mg/mL: INTRAVENOUS | @ 18:00:00 | Stop: 2023-08-27

## 2023-08-27 MED ADMIN — bupivacaine (PF) (MARCAINE) 0.5 % (5 mg/mL) 5 mL, lidocaine (XYLOCAINE) 10 mg/mL (1 %) 5 mL: @ 18:00:00 | Stop: 2023-08-27

## 2023-08-27 MED ADMIN — sodium chloride (NS) 0.9 % infusion: INTRAVENOUS | @ 18:00:00 | Stop: 2023-08-27

## 2023-08-27 MED ADMIN — ketamine (KETALAR) injection: INTRAVENOUS | @ 18:00:00 | Stop: 2023-08-27

## 2023-08-27 MED ADMIN — phenylephrine 1 mg/10 mL (100 mcg/mL) injection Syrg: INTRAVENOUS | @ 19:00:00 | Stop: 2023-08-27

## 2023-08-27 MED ADMIN — fentaNYL (PF) (SUBLIMAZE) injection: INTRAVENOUS | @ 18:00:00 | Stop: 2023-08-27

## 2023-08-27 NOTE — Unmapped (Signed)
Brief Pre-operative History & Physical    Patient name: Jerome Kennedy  CSN: [id number]  MRN: [id number]  Admit Date: 08/27/2023  Date of Surgery: 08/27/2023  Performing Service: Orthopedics    Code Status: Full Code      Assessment/Plan:      Toshio is a 48 y.o. male with left thumb MCP joint instability, who presents for:  Procedure(s) (LRB):  ARTHRODESIS, METACARPOPHALANGEAL JOINT, WITH OR WITHOUT INTERNAL FIXATION (Left).     He presents for:  Procedure(s) (LRB):  ARTHRODESIS, METACARPOPHALANGEAL JOINT, WITH OR WITHOUT INTERNAL FIXATION (Left).     Consent obtained in office is accurate. Risks, benefits, and alternatives to surgery were reviewed, and all questions were answered.    Proceed to the OR as planned.         History of Present Illness:    Jerome Kennedy. is a 48 y.o. male with left thumb MCP joint instability. He was recently seen in clinic, where a detailed HPI can be found. He was noted to benefit from:  Procedure(s) (LRB):  ARTHRODESIS, METACARPOPHALANGEAL JOINT, WITH OR WITHOUT INTERNAL FIXATION (Left).       Allergies  Morphine and Toradol [ketorolac]    Home Medications    Prior to Admission medications    Medication Sig Start Date End Date Taking? Authorizing Provider   cholecalciferol, vitamin D3 25 mcg, 1,000 units,, 1,000 unit (25 mcg) tablet Take 1 tablet (25 mcg total) by mouth daily. 11/13/22   Syptak, Julien Girt, MD   dicyclomine (BENTYL) 10 mg capsule Take 1 capsule (10 mg total) by mouth Four (4) times a day (before meals and nightly). 05/26/23 05/25/24  Jeanann Lewandowsky, MD   empty container Misc Use as directed 04/24/23   Jeanann Lewandowsky, MD   HUMIRA PEN CITRATE FREE 40 MG/0.4 ML Inject the contents of 1 pen (40 mg total) under the skin every seven (7) days. 06/18/23   Jeanann Lewandowsky, MD   OPTICHAMBER DIAMOND [facility]  07/23/22   [provider]   pantoprazole (PROTONIX) 40 MG tablet Take 1 tablet (40 mg total) by mouth daily. 11/13/22   Syptak, Julien Girt, MD   polyethylene glycol (MIRALAX) 17 gram packet Take 17 g (1 packet in 4-8 oz of liquid)  by mouth daily. 05/26/23   Jeanann Lewandowsky, MD   QUEtiapine (SEROQUEL) 100 MG tablet Take 1 tablet (100 mg total) by mouth nightly. 05/28/22   [provider]       Vital Signs  There were no vitals taken for this visit.  Facility age limit for growth %iles is 20 years.  Facility age limit for growth %iles is 20 years..     Physical Exam  General: Well developed, appears stated age, in no acute distress  Mental status: Alert and oriented x3  Cardiovascular: Normal  Pulmonary: Symmetric chest rise, unlabored breathing  Relevant System for Surgery: Surgical site examined and Patient was marked    Labs and Studies:  Lab Results   Component Value Date    WBC 5.6 06/30/2023    HGB 14.2 06/30/2023    HCT 42.9 06/30/2023    PLT >124 (L) 06/30/2023       Lab Results   Component Value Date    PT 12.1 02/22/2023    INR 1.08 02/22/2023    APTT 30.0 10/21/2019       Relevant Past Medical History:  Past Medical History:   Diagnosis Date    Anemia 04/05/2022  Anxiety     Avascular necrosis of femur head, right (CMS-HCC) 2011    Bipolar disorder (CMS-HCC) 2013    Follows the Psychiatry    Cannabis use disorder     COPD (chronic obstructive pulmonary disease) (CMS-HCC) 2022    Crohn's disease (CMS-HCC) 1990    Depression 2007    Severe depressive episode with psychotic symptoms    DVT (deep venous thrombosis) (CMS-HCC) 02/08/2010    DVT of superior vena cava, left brachiocephalic, 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    Myalgia     Rhinovirus infection 10/29/2022    SBO (small bowel obstruction) (CMS-HCC) 2011       Relevant Past Surgical History:  Past Surgical History:   Procedure Laterality Date    CHOLECYSTECTOMY      COLON SURGERY      partial resection    HERNIA REPAIR      PR CLOSE ENTEROSTOMY,RESEC+COLOREC ANAS Midline 11/01/2019    Procedure: CLO ENTEROSTOMY; W/RESECT COLORECTAL ANASTOM;  Surgeon: Claretta Fraise, MD;  Location: MAIN OR [place];  Service: Gastrointestinal    PR COLONOSCOPY FLX DX W/COLLJ SPEC WHEN PFRMD Left 01/07/2013    Procedure: COLONOSCOPY, FLEXIBLE, PROXIMAL TO SPLENIC FLEXURE; DIAGNOSTIC, W/WO COLLECTION SPECIMEN BY BRUSH OR WASH;  Surgeon: Tish Men, MD;  Location: [facility];  Service: Gastroenterology    PR COLONOSCOPY FLX DX W/COLLJ SPEC WHEN PFRMD  03/09/2015    Procedure: COLONOSCOPY, FLEXIBLE, PROXIMAL TO SPLENIC FLEXURE; DIAGNOSTIC, W/WO COLLECTION SPECIMEN BY BRUSH OR WASH;  Surgeon: Cletis Athens, MD;  Location: GI [facility]URES [facility];  Service: Gastroenterology    PR COLONOSCOPY FLX DX W/COLLJ SPEC WHEN PFRMD N/A 10/28/2019    Procedure: COLONOSCOPY, FLEXIBLE, PROXIMAL TO SPLENIC FLEXURE; DIAGNOSTIC, W/WO COLLECTION SPECIMEN BY BRUSH OR WASH;  Surgeon: Beverly Milch, MD;  Location: GI PROCEDURES [facility];  Service: Gastroenterology    PR COLONOSCOPY FLX DX W/COLLJ SPEC WHEN PFRMD N/A 10/01/2022    Procedure: COLONOSCOPY, FLEXIBLE, PROXIMAL TO SPLENIC FLEXURE; DIAGNOSTIC, W/WO COLLECTION SPECIMEN BY BRUSH OR WASH;  Surgeon: Zetta Bills, MD;  Location: GI [facility]URES [facility];  Service: Gastroenterology    PR EXPLORATORY OF ABDOMEN Midline 09/02/2019    Procedure: EXPLORATORY LAPAROTOMY, EXPLORATORY CELIOTOMY WITH OR WITHOUT BIOPSY(S);  Surgeon: Claretta Fraise, MD;  Location: MAIN OR [facility];  Service: Gastrointestinal    PR EXPLORATORY OF ABDOMEN Midline 11/01/2019    Procedure: EXPLORATORY LAPAROTOMY, EXPLORATORY CELIOTOMY WITH OR WITHOUT BIOPSY(S);  Surgeon: Claretta Fraise, MD;  Location: MAIN OR [facility];  Service: Gastrointestinal    PR FIX FINGER,VOLAR PLATE,I-P JT Left 08/07/2022    Procedure: REPAIR AND RECONSTRUCTION, FINGER, VOLAR PLATE, INTERPHALANGEAL JOINT;  Surgeon: [name], MD;  Location: MAIN OR [facility];  Service: Orthopedics    PR FREEING BOWEL ADHESION,ENTEROLYSIS N/A 11/01/2019    Procedure: Enterolysis (Separt Proc);  Surgeon: Claretta Fraise, MD;  Location: MAIN OR [facility]; Service: Gastrointestinal    PR ILEOSCOPY THRU STOMA,BIOPSY N/A 10/14/2019    Procedure: Karren Cobble; Marquette Saa 1/MX;  Surgeon: Neysa Hotter, MD;  Location: GI [facility]URES [facility];  Service: Gastroenterology    PR PART REMOVAL COLON W COLOSTOMY Midline 09/02/2019    Procedure: COLECTOMY, PARTIAL; WITH SKIN LEVEL CECOSTOMY OR COLOSTOMY;  Surgeon: Claretta Fraise, MD;  Location: MAIN OR [facility];  Service: Gastrointestinal    PR REPAIR INCISIONAL HERNIA,REDUCIBLE Midline 09/02/2019    Procedure: REPAIR INIT INCISIONAL OR VENTRAL HERNIA; REDUCIBLE;  Surgeon: Kristopher Oppenheim, MD;  Location: MAIN OR [place];  Service:  Trauma    PR REPAIR INTERCARP/CARP-METACARP JT Left 08/07/2022    Procedure: INTERPOSIT ARTHROPLASTY-INTERCARPAL/CARPOMETACAR;  Surgeon: [name], MD;  Location: MAIN OR [facility];  Service: Orthopedics    PR UPPER GI ENDOSCOPY,BIOPSY N/A 01/07/2013    Procedure: UGI ENDOSCOPY; WITH BIOPSY, SINGLE OR MULTIPLE;  Surgeon: Tish Men, MD;  Location: GI [facility]URES [facility];  Service: Gastroenterology    PR UPPER GI ENDOSCOPY,BIOPSY N/A 10/14/2019    Procedure: UGI ENDOSCOPY; WITH BIOPSY, SINGLE OR MULTIPLE;  Surgeon: Neysa Hotter, MD;  Location: GI PROC[facility]URES [facility];  Service: Gastroenterology    PR UPPER GI ENDOSCOPY,DIAGNOSIS N/A 10/01/2022    Procedure: UGI ENDO, INCLUDE ESOPHAGUS, STOMACH, & DUODENUM &/OR JEJUNUM; DX W/WO COLLECTION SPECIMN, BY BRUSH OR WASH;  Surgeon: Zetta Bills, MD;  Location: GI [facility]URES [facility];  Service: Gastroenterology

## 2023-08-27 NOTE — Unmapped (Signed)
Care Taker:   Gershon Mussel  [phone]

## 2023-08-27 NOTE — Unmapped (Signed)
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  Left thumb metacarpophalangeal joint fusion (CPT T228550)     Resident Physician(s): Vida Roller, MD; Ilene Qua, MD    Anesthesia: Monitor Anesthesia Care     Antibiotics: Ancef 2 grams IV preoperatively.    Tourniquet time:   Total Tourniquet Time Documented:  Arm  (Left) - 73 minutes  Total: Arm  (Left) - 73 minutes  .    Estimated Blood Loss: Minimal     Complications: None     Specimens: None     Indications for Surgery:    Jerome Kennedy. is a 48 y.o. male with history of Crohn's arthropathy and left thumb MP joint instability who presents today for left thumb metacarpophalangeal joint fusion.  The procedure as well as the risks benefits and alternative options were discussed.  Patient wished to proceed.    Operative Findings:  See op note    Procedure:    The patient was identified in the preoperative holding area where the surgical site was marked with indelible ink. The patient was taken to the OR where a procedural timeout was called and the above noted anesthesia was induced.  Preoperative antibiotics were dosed.  The patient's left upper extremity was prepped and draped in the usual sterile fashion.  Prior to the start of the surgical procedure a second preoperative timeout was called which included verifying the correct patient, the correct operative site, and correct procedure.    We then commenced with our operation.  An Esmarch bandage was used to exsanguinate the limb and the tourniquet was inflated.      A 15 blade scalpel was used to make a dorsal longitudinal incision over the metacarpophalangeal joint of the left thumb.  Blunt dissection was used to dissect down to the extensor mechanism over the MP joint.  The interval between the EPB and EPL tendons was identified and sharply dissected with a 15 blade scalpel exposing the dorsal joint capsule.  This was sharply opened with a 15 blade scalpel exposing the head of the first metacarpal and base of the proximal phalanx.  An oscillating saw was then used to create an angled osteotomy at the metacarpal head in preparation for fusion.  A transverse osteotomy was then made just distal to the articular surface of the proximal phalanx using a saw.  Once the bones were prepped for fusion the joint was positioned and a K wire was driven across the metacarpophalangeal joint for preliminary stabilization.  Intraoperative fluoroscopy was then brought on the field and the joint was evaluated.  The joint was found to be in excellent position for fusion.  The guide from the Arthrex 15 mm nitinol staple set was brought to the field and positioned across the MP joint.  The provided drill was then used to drill the osseous tunnels in the metacarpal head and proximal phalangeal base.  The nitinol staple was then inserted into these drill holes and deployed.  A second nitinol staple was then placed using the same technique.  The K wire was then removed.  Multiplanar fluoroscopic imaging revealed excellent compression at the fusion site as well as proper fusion and hardware position.  The wound was then irrigated and the  interval between the EPL and EPB was repaired using 4-0 Ethibond.  The skin was then closed using 4-0 nylon.  Sterile dressings were placed.  The patient was placed into a plaster thumb spica splint.  The tourniquet was deflated following placement of sterile dressings with nice return of blood flow to the hand and all fingers.  At the end of the case all sponge instrument and needle counts were correct.  He was transported to the postoperative care unit in stable condition.    Post-op Plan/Instructions:     The patient will be discharged home.  He will be placed into a thumb spica cast at his first postoperative visit.    Teaching Surgeon Attestation: Osborn Coho MD was present, scrubbed and an active participant for the entire procedure.      [name], MD   Date: 08/27/2023  Time: 2:16 PM

## 2023-08-28 MED FILL — OXYCODONE 5 MG TABLET: ORAL | 3 days supply | Qty: 15 | Fill #0

## 2023-08-31 NOTE — Unmapped (Signed)
[facility] Specialty and Home Delivery Pharmacy Refill Coordination Note    Specialty Medication(s) to be Shipped:   Inflammatory Disorders: Humira    Other medication(s) to be shipped: No additional medications requested for fill at this time     Jerome Kennedy., DOB: 1976-01-03  Phone: There are no phone numbers on file.      All above HIPAA information was verified with patient.     Was a Nurse, learning disability used for this call? No    Completed refill call assessment today to schedule patient's medication shipment from the [facility] Delivery Pharm[facility]  [phone]).  All relevant notes have been reviewed.     Specialty medication(s) and dose(s) confirmed: Regimen is correct and unchanged.   Changes to medications: Jerome Kennedy reports no changes at this time.  Changes to insurance: No  New side effects reported not previously addressed with a pharmacist or physician: None reported  Questions for the pharmacist: No    Confirmed patient received a Conservation officer, historic buildings and a Surveyor, mining with first shipment. The patient will receive a drug information handout for each medication shipped and additional FDA Medication Guides as required.       DISEASE/MEDICATION-SPECIFIC INFORMATION        For patients on injectable medications: Patient currently has 1 doses left.  Next injection is scheduled for this week.    SPECIALTY MEDICATION ADHERENCE     Medication Adherence    Patient reported X missed doses in the last month: 0  Specialty Medication: HUMIRA(CF) PEN 40 mg/0.4 mL  Patient is on additional specialty medications: No              Were doses missed due to medication being on hold? No    Humira 40/0.4 mg/ml: 1 doses of medicine on hand        REFERRAL TO PHARMACIST     Referral to the pharmacist: Not needed      Jerome Kennedy     Shipping address confirmed in Epic.       Delivery Scheduled: Yes, Expected medication delivery date: 09/02/23.     Medication will be delivered via Same Day Courier to the prescription address in Epic [place].    Jerome Kennedy   [facility] Special[facility] and Home Delivery Pharm[facility]  Specialty Technician

## 2023-09-02 MED FILL — EMPTY CONTAINER: 120 days supply | Qty: 1 | Fill #2

## 2023-09-02 MED FILL — HUMIRA PEN CITRATE FREE 40 MG/0.4 ML: SUBCUTANEOUS | 28 days supply | Qty: 4 | Fill #2

## 2023-09-03 NOTE — Unmapped (Signed)
Spoke with Mr. Mazzola regarding upcoming GI procedure on 09/07/23 at [facility] location at 0900 with an arrival time of 0800.     Informed patient prep instructions were sent via MyChart on 07/22/23.  Confirmed instructions were received, reviewed and understood. No questions at this time. Number provided for future questions if they arise.    Verified prep medications ordered/received.    Reviewed information regarding holding vitamins/supplements/oil based items/iron containing products the day before and day of procedure. All other approved medications should be taken at least two hours prior to appointment time.      Reviewed low fiber diet should have started three days prior to procedure.    Reviewed LIQUID diet requirement the entire day prior to procedure and NOTHING AT ALL 2 hours prior to procedure    Reviewed driver requirement (18 or older, must be able to drive patient home, NO UBER OR LYFT FOR PICK-UP, MUST SIGN DISCHARGE PAPERS, must remain within 20 minutes of facility and reachable by cell phone).    No further questions at this time.

## 2023-09-06 NOTE — Unmapped (Signed)
ORTHOPAEDIC RETURN CLINIC NOTE     ASSESSMENT:  Jerome Catlin. is a 48 y.o. male status post left thumb metacarpophalangeal joint fusion. Doing well.  Date of surgery: 08/27/23    PLAN:  -Sutures removed  -Will place in thumb spica cast  -Continue activity restrictions  -RTC 1 month    INTERIM HISTORY:  Doing well. Compliant with splint. Compliant with activity restrictions. No pain.    PHYSICAL EXAM:  General: Well developed, well nourished male in no apparent distress  Musculoskeletal: L thumb in excellent resting position, incision c/d/I, no erythema, no drainage, moderate swelling    IMAGING:  none

## 2023-09-09 ENCOUNTER — Ambulatory Visit
Admit: 2023-09-09 | Discharge: 2023-09-10 | Payer: PRIVATE HEALTH INSURANCE | Attending: Plastic and Reconstructive Surgery | Primary: Plastic and Reconstructive Surgery

## 2023-09-09 NOTE — Unmapped (Addendum)
Thank you for choosing [facility]!  We appreciate the opportunity to participate in your care.      If any questions or concerns arise after your visit, please do not hesitant to contact me by [facility] or by calling my clinical support, Irving Burton, at [phone].      Voicemail messages: Messages are checked between 8:00 am- 4:00 pm Monday- Friday.    MyChart messages: These messages are checked by the clinical staff during normal business hours 8:30 am-4:30 pm Monday-Friday every 24-48 hours and are for non-urgent, non-emergent concerns. You may be asked to return for a follow up visit if it is deemed your questions are best handled in the clinic setting. If you are not signed up on [facility], please call the [facility] at [phone] to receive help activating your account.    Please let me know if I can be of assistance with this or other orthopaedic issues in the future.         Caring for Your Cast: After Your Visit  Your Care Instructions  A cast protects a broken bone or other injury. Most casts are made of fiberglass, but plaster casts are still sometimes used.  Once a cast is on, you can't remove it yourself. Your doctor will take it off.  Follow-up care is a key part of your treatment and safety. Be sure to make and go to all appointments, and call your doctor if you are having problems. It's also a good idea to know your test results and keep a list of the medicines you take.  How can you care for yourself at home?  General care  Follow your doctor's instructions for when you can first put weight on the cast. Fiberglass casts dry quickly and are soon ready to bear weight. But plaster casts may take several days before they are hard enough to use. When it's okay to put weight on your cast, do not stand or walk on it unless it is designed for walking.  Prop up the injured arm or leg on a pillow anytime you sit or lie down during the next 3 days. Try to keep it above the level of your heart. This will help reduce swelling.  If the fingers or toes on the limb with the cast were not injured, wiggle them every now and then. This helps move the blood and fluids in the injured limb.  Be safe with medicines. Read and follow all instructions on the label.  If the doctor gave you a prescription medicine for pain, take it as prescribed.  If you are not taking a prescription pain medicine, ask your doctor if you can take an over-the-counter medicine.  Keep up your muscle strength and tone as much as you can while protecting your injured limb or joint. Your doctor may want you to tense and relax the muscles protected by the cast. Check with your doctor or physical therapist for instructions.  Water and your cast  Do not get your cast wet unless you have a fiberglass cast with a quick-drying lining.  Keep your cast covered with at least two layers of plastic when you take a shower or bath or when you have any other contact with water. Moisture can collect under the cast and cause skin irritation and itching. It can make infection more likely if you have had surgery or have a wound under the cast.  If you have a fiberglass cast with a fast-drying lining, make sure  to rinse it with fresh water after you swim. It will take about an hour for the lining to dry.  Cast and skin care  Try blowing cool air from a hair dryer or fan into the cast to help relieve itching. Never stick items under your cast to scratch the skin.  Don't use oils or lotions near your cast. If the skin gets red or irritated around the edge of the cast, you may pad the edges with a soft material or use tape to cover them.  Watch for pressure sores. These can develop over bony areas. Symptoms include a warm spot under the cast, pain, drainage, or an odor. Call your doctor if you think you have a pressure sore.  Watch for compartment syndrome. This happens when pressure builds up in a group of muscles, nerves, and blood vessels. It is an emergency. Symptoms include severe pain or tingling or numbness.  When should you call for help?  Call your doctor now or seek immediate medical care if:  You have increased or severe pain.  You feel a warm or painful spot under the cast.  You have problems with your cast. For example:  The skin under the cast burns or stings.  The cast feels too tight.  There is a lot of swelling near the cast. (Some swelling is normal.)  You have a new fever.  There is drainage or a bad smell coming from the cast.  Your foot or hand is cool or pale or changes color.  You have trouble moving your fingers or toes.  You have symptoms of a blood clot in your arm or leg (called a deep vein thrombosis). These may include:  Pain in the arm, calf, back of the knee, thigh, or groin.  Redness and swelling in the arm, leg, or groin.  Watch closely for changes in your health, and be sure to contact your doctor if:  The cast is breaking apart.  You are not getting better as expected.   Where can you learn more?   Go to http://[facility]  Enter [phone] in the search box to learn more about Caring for Your Cast: After Your Visit.   ?? 2006-2014 [employer]. Care instructions adapted under license by [facility]. This care instruction is for use with your licensed healthcare professional. If you have questions about a medical condition or this instruction, always ask your healthcare professional. [employer] disclaims any warranty or liability for your use of this information.  Content Version: 10.0.270728; Last Revised: February 17, 2012

## 2023-09-09 NOTE — Unmapped (Signed)
Fiberglass/Plaster Cast Application  Performed by: Duffy Bruce  Authorizing provider: Gus Puma  Consent: Verbal consent obtained  Consent given by: Patient  Patient/Guardian understanding: Patient understands the procedure being performed.   Patient identity confirmed: Verbally with Patient  Laterality confirmed: Verbally with Patient    Supplies used: stockinette, cotton padding, fiberglass and/or plaster    Comments: The Thumb spica cast was applied using standard casting techniques at the direction of the provider.  Patient was instructed in the proper cast precautions.  They were told to elevate the affected extremity when possible at or above the level of the heart and to keep the cast clean and dry.  If there are any problems with the cast, patient instructed to call the office, send MyChart message, or go to the emergency room ([address], [place], [place] [address]) after regular office hours. All questions were answered and the Patient expressed understanding. They will contact Orthopaedics [phone]) with any questions or concerns.

## 2023-09-28 ENCOUNTER — Emergency Department
Admit: 2023-09-28 | Discharge: 2023-09-28 | Disposition: A | Discharge status: Home / Self Care | Payer: PRIVATE HEALTH INSURANCE | Attending: Emergency Medicine

## 2023-09-28 DIAGNOSIS — R1084 Generalized abdominal pain: Principal | ICD-10-CM

## 2023-09-28 DIAGNOSIS — N179 Acute kidney failure, unspecified: Principal | ICD-10-CM

## 2023-09-28 LAB — CBC W/ AUTO DIFF
BASOPHILS ABSOLUTE COUNT: 0 10*9/L (ref 0.0–0.1)
BASOPHILS RELATIVE PERCENT: 0.7 %
EOSINOPHILS ABSOLUTE COUNT: 0.2 10*9/L (ref 0.0–0.5)
EOSINOPHILS RELATIVE PERCENT: 2.8 %
HEMATOCRIT: 42.7 % (ref 39.0–48.0)
HEMOGLOBIN: 13.5 g/dL (ref 12.9–16.5)
LYMPHOCYTES ABSOLUTE COUNT: 2.1 10*9/L (ref 1.1–3.6)
LYMPHOCYTES RELATIVE PERCENT: 34.2 %
MEAN CORPUSCULAR HEMOGLOBIN CONC: 31.6 g/dL — ABNORMAL LOW (ref 32.0–36.0)
MEAN CORPUSCULAR HEMOGLOBIN: 26.8 pg (ref 25.9–32.4)
MEAN CORPUSCULAR VOLUME: 84.8 fL (ref 77.6–95.7)
MEAN PLATELET VOLUME: 8.1 fL (ref 6.8–10.7)
MONOCYTES ABSOLUTE COUNT: 0.4 10*9/L (ref 0.3–0.8)
MONOCYTES RELATIVE PERCENT: 5.9 %
NEUTROPHILS ABSOLUTE COUNT: 3.5 10*9/L (ref 1.8–7.8)
NEUTROPHILS RELATIVE PERCENT: 56.4 %
PLATELET COUNT: 191 10*9/L (ref 150–450)
RED BLOOD CELL COUNT: 5.04 10*12/L (ref 4.26–5.60)
RED CELL DISTRIBUTION WIDTH: 15.6 % — ABNORMAL HIGH (ref 12.2–15.2)
WBC ADJUSTED: 6.2 10*9/L (ref 3.6–11.2)

## 2023-09-28 LAB — URINALYSIS WITH MICROSCOPY WITH CULTURE REFLEX PERFORMABLE
BACTERIA: NONE SEEN /HPF
BILIRUBIN UA: NEGATIVE
BLOOD UA: NEGATIVE
GLUCOSE UA: NEGATIVE
KETONES UA: NEGATIVE
LEUKOCYTE ESTERASE UA: NEGATIVE
NITRITE UA: NEGATIVE
PH UA: 6.5 (ref 5.0–9.0)
RBC UA: 3 /HPF (ref <=3)
SPECIFIC GRAVITY UA: 1.029 (ref 1.003–1.030)
SQUAMOUS EPITHELIAL: 1 /HPF (ref 0–5)
UROBILINOGEN UA: <2
WBC UA: 1 /HPF (ref <=2)

## 2023-09-28 LAB — COMPREHENSIVE METABOLIC PANEL
ALBUMIN: 3.1 g/dL — ABNORMAL LOW (ref 3.4–5.0)
ALKALINE PHOSPHATASE: 114 U/L (ref 46–116)
ALT (SGPT): 7 U/L — ABNORMAL LOW (ref 10–49)
ANION GAP: 17 mmol/L — ABNORMAL HIGH (ref 5–14)
AST (SGOT): 15 U/L (ref <=34)
BILIRUBIN TOTAL: 0.3 mg/dL (ref 0.3–1.2)
BLOOD UREA NITROGEN: 16 mg/dL (ref 9–23)
BUN / CREAT RATIO: 13
CALCIUM: 8.8 mg/dL (ref 8.7–10.4)
CHLORIDE: 106 mmol/L (ref 98–107)
CO2: 17 mmol/L — ABNORMAL LOW (ref 20.0–31.0)
CREATININE: 1.26 mg/dL — ABNORMAL HIGH (ref 0.73–1.18)
EGFR CKD-EPI (2021) MALE: 71 mL/min/{1.73_m2} (ref >=60)
GLUCOSE RANDOM: 93 mg/dL (ref 70–179)
POTASSIUM: 4.3 mmol/L (ref 3.4–4.8)
PROTEIN TOTAL: 6.7 g/dL (ref 5.7–8.2)
SODIUM: 140 mmol/L (ref 135–145)

## 2023-09-28 LAB — C-REACTIVE PROTEIN: C-REACTIVE PROTEIN: 31.2 mg/L — ABNORMAL HIGH (ref <=10.0)

## 2023-09-28 LAB — LIPASE: LIPASE: 45 U/L (ref 12–53)

## 2023-09-28 MED ADMIN — diphenhydrAMINE (BENADRYL) capsule/tablet 25 mg: 25 mg | ORAL | @ 20:00:00 | Stop: 2023-09-28

## 2023-09-28 MED ADMIN — HYDROmorphone (PF) (DILAUDID) injection 1 mg: 1 mg | INTRAVENOUS | @ 20:00:00 | Stop: 2023-09-28

## 2023-09-28 MED ADMIN — prochlorperazine (COMPAZINE) injection 2.5 mg: 2.5 mg | INTRAVENOUS | @ 20:00:00 | Stop: 2023-09-28

## 2023-09-28 MED ADMIN — HYDROmorphone (PF) (DILAUDID) injection 1 mg: 1 mg | INTRAVENOUS | @ 22:00:00 | Stop: 2023-09-28

## 2023-09-28 NOTE — Unmapped (Signed)
 BIB [facility] for RLQ pain w/ N/V. Hx Crohns.

## 2023-09-28 NOTE — Unmapped (Signed)
 [facility]  Emergency Department Provider Note      ED Clinical Impression       Diagnosis ICD-10-CM Associated Orders   1. Generalized abdominal pain  R10.84       2. 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 This has been complicated by short gut syndrome, high ostomy output for which he underwent ileostomy takedown and reanastomosis presents to the ED with abdominal pain, nausea, and vomiting. She reports unchanged bowel movements, and physical exam reveals a reducible hernia with minimal pain, lowering the concern for a bowel obstruction. The patient admits to missing a dose of Humira, which raises the possibility of a Crohn's flare and patient reports past chrons flaires to be associated with nause and vomiting. Given symptoms, we will obtain basic labs, including CBC, metabolic panel, and liver function tests, along with ESR and CRP to assess for inflammation. The differential diagnosis includes Crohn's disease flare versus other gastrointestinal causes of symptoms such as SBO, viral causes of nausea and vomiting, incarcerated hernia, Rib fractures (R rib pain). Considering her clinical presentation and history, we will evaluate the need for a CT scan of the abdomen or opt for admission for further gastrointestinal evaluation, pending the results of the labs. Pain management will be initiated, and the patient will be closely monitored for any changes in condition. Workup as below. Disposition pending further evaluation and treatment.    ED Course as of 09/28/23 1754   Mon Sep 28, 2023   1616 CXR showed no cardiopulmonary abnormalities and no rib fractures   1617 Hgb stable at 13.5   1724 CRP elevated to 31, which is decreased from previous admission to ED. CMP notable for elevated creatine to 1.26 concerning for AKI.Gave 1L fluids. Plan for admission for AKI and chrons flare management.    1730 MAO paged for admission to medicine   1747 Discussed with patient the lab results and that we recommend being admitted for rehydration, close monitoring of kidney function and evaluation of abdomen with CT scan, however, patient requested to be discharged and follow up outpatient. Discussed the importance of oral rehydration and patient was understanding. Patient was understanding that we recommend a CT scan of the abdomen for further evaluation of the pain iso of multiple abdominal surgeries, however, he still would prefer to discharge and follow up outpatient. Patient was understanding of return precautions.        Additional MDM Elements       Discussion with other professionals: None  Independent interpretation: X-ray(s) - CXR with no cardiopulmonary abnormalities, no rib fractures  I have reviewed recent and relavant previous record, including: Outpatient notes - Care everywhere outpatient notes  Escalation of Care including OBS/Admission/Transfer was considered: Admission discussed. Patient/family declined. See progress note for additional detail.      Diagnostic tests considered but not performed: CT scan was discussed, however, patient wished to discharge.         See chart and nursing documentation for additional ED course details.    ____________________________________________  History        Reason for Visit  Abdominal Pain      HPI   Jerome Kennedy is a 48 y.o. 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  This has been complicated by short gut syndrome, high ostomy output for which he underwent ileostomy takedown and reanastomosis who presents to the ED with nausea, vomiting and abdominal pain. He states that the symptoms started 2 hours prior and they feel consistent with his past chrons flares. He states he is continuing to have bowel movements that are unchanged. He denies any recent sick contacts, but endorses subjective fever and chills this morning. Denies any recent traumas. He is on humira for chrons, but missed his dose last week due to not having it. He is scheduled for a colonoscopy in about a month, and does not remember when his last one was.           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    Myalgia     Rhinovirus infection 10/29/2022    SBO (small bowel obstruction) (CMS-HCC) 2011       Past Surgical History:   Procedure Laterality Date    CHOLECYSTECTOMY      COLON SURGERY      partial resection    HERNIA REPAIR      PR ARTHRP INTERCARPAL/CARP/MTCRPL JT INTERPOSITION Left 08/07/2022    Procedure: INTERPOSIT ARTHROPLASTY-INTERCARPAL/CARPOMETACAR;  Surgeon: [name], MD;  Location: MAIN OR [facility];  Service: Orthopedics    PR CLOSE ENTEROSTOMY,RESEC+COLOREC ANAS Midline 11/01/2019    Procedure: CLO ENTEROSTOMY; W/RESECT Daleen Snook;  Surgeon: Claretta Fraise, MD;  Location: MAIN OR [place];  Service: Gastrointestinal    PR COLONOSCOPY FLX DX W/COLLJ SPEC WHEN PFRMD Left 01/07/2013    Procedure: COLONOSCOPY, FLEXIBLE, PROXIMAL TO SPLENIC FLEXURE; DIAGNOSTIC, W/WO COLLECTION SPECIMEN BY BRUSH OR WASH;  Surgeon: Tish Men, MD;  Location: GI PROC[facility]URES [facility];  Service: Gastroenterology    PR COLONOSCOPY FLX DX W/COLLJ SPEC WHEN PFRMD  03/09/2015    Procedure: COLONOSCOPY, FLEXIBLE, PROXIMAL TO SPLENIC FLEXURE; DIAGNOSTIC, W/WO COLLECTION SPECIMEN BY BRUSH OR WASH;  Surgeon: Cletis Athens, MD;  Location: GI [facility]URES [facility];  Service: Gastroenterology    PR COLONOSCOPY FLX DX W/COLLJ SPEC WHEN PFRMD N/A 10/28/2019    Procedure: COLONOSCOPY, FLEXIBLE, PROXIMAL TO SPLENIC FLEXURE; DIAGNOSTIC, W/WO COLLECTION SPECIMEN BY BRUSH OR WASH;  Surgeon: Beverly Milch, MD;  Location: GI PROC[facility]URES [facility];  Service: Gastroenterology    PR COLONOSCOPY FLX DX W/COLLJ SPEC WHEN PFRMD N/A 10/01/2022    Procedure: COLONOSCOPY, FLEXIBLE, PROXIMAL TO SPLENIC FLEXURE; DIAGNOSTIC, W/WO COLLECTION SPECIMEN BY BRUSH OR WASH;  Surgeon: Zetta Bills, MD;  Location: GI PROC[facility]URES [facility];  Service: Gastroenterology    PR EXPLORATORY OF ABDOMEN Midline 09/02/2019  Procedure: EXPLORATORY LAPAROTOMY, EXPLORATORY CELIOTOMY WITH OR WITHOUT BIOPSY(S);  Surgeon: Claretta Fraise, MD;  Location: MAIN OR [facility];  Service: Gastrointestinal    PR EXPLORATORY OF ABDOMEN Midline 11/01/2019    Procedure: EXPLORATORY LAPAROTOMY, EXPLORATORY CELIOTOMY WITH OR WITHOUT BIOPSY(S);  Surgeon: Claretta Fraise, MD;  Location: MAIN OR [facility];  Service: Gastrointestinal    PR FIX FINGER,VOLAR PLATE,I-P JT Left 08/07/2022    Procedure: REPAIR AND RECONSTRUCTION, FINGER, VOLAR PLATE, INTERPHALANGEAL JOINT;  Surgeon: [name], MD;  Location: MAIN OR [facility];  Service: Orthopedics    PR FREEING BOWEL ADHESION,ENTEROLYSIS N/A 11/01/2019    Procedure: Enterolysis (Separt Proc);  Surgeon: Claretta Fraise, MD;  Location: MAIN OR [facility];  Service: Gastrointestinal    PR FUSION MC-P JT Left 08/27/2023    Procedure: ARTHRODESIS, METACARPOPHALANGEAL JOINT, WITH OR WITHOUT INTERNAL FIXATION;  Surgeon: [name], MD; Location: OR [facility];  Service: Orthopedics    PR ILEOSCOPY THRU STOMA,BIOPSY N/A 10/14/2019    Procedure: Karren Cobble; [place] Saa 1/MX;  Surgeon: Neysa Hotter, MD;  Location: GI [facility]URES [facility];  Service: Gastroenterology    PR PART REMOVAL COLON W COLOSTOMY Midline 09/02/2019    Procedure: COLECTOMY, PARTIAL; WITH SKIN LEVEL CECOSTOMY OR COLOSTOMY;  Surgeon: Claretta Fraise, MD;  Location: MAIN OR [facility];  Service: Gastrointestinal    PR REPAIR INCISIONAL HERNIA,REDUCIBLE Midline 09/02/2019    Procedure: REPAIR INIT INCISIONAL OR VENTRAL HERNIA; REDUCIBLE;  Surgeon: Kristopher Oppenheim, MD;  Location: MAIN OR [place];  Service: Trauma    PR UPPER GI ENDOSCOPY,BIOPSY N/A 01/07/2013    Procedure: UGI ENDOSCOPY; WITH BIOPSY, SINGLE OR MULTIPLE;  Surgeon: Tish Men, MD;  Location: GI [facility];  Service: Gastroenterology    PR UPPER GI ENDOSCOPY,BIOPSY N/A 10/14/2019    Procedure: UGI ENDOSCOPY; WITH BIOPSY, SINGLE OR MULTIPLE;  Surgeon: Neysa Hotter, MD;  Location: GI PROC[facility]URES [facility];  Service: Gastroenterology    PR UPPER GI ENDOSCOPY,DIAGNOSIS N/A 10/01/2022    Procedure: UGI ENDO, INCLUDE ESOPHAGUS, STOMACH, & DUODENUM &/OR JEJUNUM; DX W/WO COLLECTION SPECIMN, BY BRUSH OR WASH;  Surgeon: Zetta Bills, MD;  Location: GI PROC[facility]URES [facility];  Service: Gastroenterology         Current Facility-Administered Medications:     lactated ringers bolus 1,000 mL, 1,000 mL, Intravenous, Once, Jerome Geisel C, DO    Current Outpatient Medications:     cholecalciferol, vitamin D3 25 mcg, 1,000 units,, 1,000 unit (25 mcg) tablet, Take 1 tablet (25 mcg total) by mouth daily., Disp: 30 tablet, Rfl: 0    dicyclomine (BENTYL) 10 mg capsule, Take 1 capsule (10 mg total) by mouth Four (4) times a day (before meals and nightly)., Disp: 120 capsule, Rfl: 3    empty container Misc, Use as directed, Disp: 1 each, Rfl: 3    HUMIRA PEN CITRATE FREE 40 MG/0.4 ML, Inject the contents of 1 pen (40 mg total) under the skin every seven (7) days., Disp: 4 each, Rfl: 5    OPTICHAMBER DIAMOND VHC Spcr, , Disp: , Rfl:     pantoprazole (PROTONIX) 40 MG tablet, Take 1 tablet (40 mg total) by mouth daily., Disp: 90 tablet, Rfl: 0    polyethylene glycol (MIRALAX) 17 gram packet, Take 17 g (1 packet in 4-8 oz of liquid)  by mouth daily., Disp: 90 packet, Rfl: 3    QUEtiapine (SEROQUEL) 100 MG tablet, Take 1 tablet (100 mg total) by mouth nightly., Disp: , Rfl:     Allergies  Morphine and Toradol [ketorolac]    Family History   Problem Relation Age of Onset    Breast cancer Mother     Diabetes Maternal Aunt     Colon cancer Maternal Grandmother     Prostate cancer Maternal Grandfather     Cancer Paternal Grandmother     Crohn's disease Neg Hx        Social History     Tobacco Use    Smoking status: Every Day     Current packs/day: 0.30     Average packs/day: 0.5 packs/day for 22.0 years (10.4 ttl pk-yrs)     Types: Cigarettes     Start date: 10/02/2001     Passive exposure: Current    Smokeless tobacco: Never    Tobacco comments:     Reduced use from 10cpd to 5cpd, though relights each one. Motivated to quit.    Vaping Use    Vaping status: Some Days    Substances: Nicotine    Passive vaping exposure: Yes   Substance Use Topics    Alcohol use: No     Alcohol/week: 0.0 standard drinks of alcohol    Drug use: Yes     Types: Marijuana     Comment: Smokes 2-3 joints/day          Physical Exam     ED Triage Vitals   Enc Vitals Group      BP 09/28/23 1402 106/69      Heart Rate 09/28/23 1356 72      SpO2 Pulse --       Resp 09/28/23 1402 16      Temp 09/28/23 1402 36.8 ??Kennedy (98.3 ??F)      Temp Source 09/28/23 1402 Oral      SpO2 09/28/23 1356 96 %      Weight 09/28/23 1402 68.5 kg (151 lb)      Height --       Head Circumference --       Peak Flow --       Pain Score --       Pain Loc --       Pain Education --       Exclude from Growth Chart --        Constitutional: Alert and oriented. Chronically ill appearing  Eyes: Conjunctivae are normal.  ENT       Head: Normocephalic and atraumatic.       Nose: No congestion.       Mouth/Throat: Mucous membranes are moist.       Neck: No stridor.  Hematological/Lymphatic/Immunilogical: No cervical lymphadenopathy.  Cardiovascular: Normal rate, regular rhythm.   Respiratory: Normal respiratory effort. Breath sounds are normal.  Gastrointestinal: Soft, tender to palpation. Large abdominal hernia that is soft and reducible.   Musculoskeletal: Normal range of motion in all extremities.       Right lower leg: No tenderness or edema.       Left lower leg: No tenderness or edema.  Neurologic: Normal speech and language. No gross focal neurologic deficits are appreciated.  Skin: Skin is warm, dry and intact. No rash noted.  Psychiatric: Mood and affect are normal. Speech and behavior are normal.       Radiology     XR Chest 2 views   Final Result      No acute cardiopulmonary abnormalities.   No identified displaced rib fractures.               Jerome Kennedy,  Hedwig Morton, DO  Resident  09/28/23 [phone]

## 2023-09-28 NOTE — Unmapped (Signed)
 Pt BIC [facility] for RLQ abd pain that began 2 hr ago. Pt describes the pain as sharp, with associated nausea and 2 episodes of emesis. Pt has hx Crohn's. A&Ox4.

## 2023-09-29 NOTE — Unmapped (Signed)
 [facility] Refill Coordination Note    Specialty Medication(s) to be Shipped:   Inflammatory Disorders: Humira    Other medication(s) to be shipped: No additional medications requested for fill at this time     Jerome Kennedy., DOB: [birth year]  Phone: There are no phone numbers on file.      All above HIPAA information was verified with patient.     Was a Nurse, learning disability used for this call? No    Completed refill call assessment today to schedule patient's medication shipment from the [facility]  [phone]).  All relevant notes have been reviewed.     Specialty medication(s) and dose(s) confirmed: Regimen is correct and unchanged.   Changes to medications: Jerome Kennedy reports no changes at this time.  Changes to insurance: No  New side effects reported not previously addressed with a pharmacist or physician: None reported  Questions for the pharmacist: No    Confirmed patient received a Conservation officer, historic buildings and a Surveyor, mining with first shipment. The patient will receive a drug information handout for each medication shipped and additional FDA Medication Guides as required.       DISEASE/MEDICATION-SPECIFIC INFORMATION        For patients on injectable medications: Patient currently has 1 doses left.  Next injection is scheduled for this week.    SPECIALTY MEDICATION ADHERENCE     Medication Adherence    Patient reported X missed doses in the last month: 0  Specialty Medication: HUMIRA(CF) PEN 40 mg/0.4 mL  Patient is on additional specialty medications: No              Were doses missed due to medication being on hold? No    Humira 40/0.4 mg/ml: 1 doses of medicine on hand        REFERRAL TO PHARMACIST     Referral to the pharmacist: Not needed      [facility]     Shipping address confirmed in Epic.       Delivery Scheduled: Yes, Expected medication delivery date: 10/01/23.     Medication will be delivered via Same Day Courier to the prescription address in Epic [place].    Jerome Kennedy   [facility]  Specialty Technician

## 2023-10-01 MED FILL — HUMIRA PEN CITRATE FREE 40 MG/0.4 ML: SUBCUTANEOUS | 28 days supply | Qty: 4 | Fill #3

## 2023-10-07 ENCOUNTER — Ambulatory Visit
Admit: 2023-10-07 | Discharge: 2023-10-08 | Payer: PRIVATE HEALTH INSURANCE | Attending: Plastic and Reconstructive Surgery | Primary: Plastic and Reconstructive Surgery

## 2023-10-07 ENCOUNTER — Inpatient Hospital Stay: Admit: 2023-10-07 | Discharge: 2023-10-08 | Payer: PRIVATE HEALTH INSURANCE

## 2023-10-07 NOTE — Unmapped (Signed)
 Addended byLucile Crater on: 10/07/2023 01:04 PM     Modules accepted: Orders

## 2023-10-07 NOTE — Unmapped (Signed)
 ORTHOPAEDIC RETURN CLINIC NOTE     ASSESSMENT:  Jerome Kennedy. is a 49 y.o. male status post left thumb metacarpophalangeal joint fusion. Doing well.  Date of surgery: 08/27/23    PLAN:  -transition to a hand based thumb spica splint for another 2 weeks. OK to take off at home and when bathing.  -Return as needed    INTERIM HISTORY:  Doing well. Compliant with cast. Compliant with activity restrictions. No pain.    PHYSICAL EXAM:  General: Well developed, well nourished male in no apparent distress  Musculoskeletal: L thumb in excellent resting position, incision healed, no erythema, no drainage, improved edema    IMAGING:  3 views of left thumb: Hardware in place across MP joint with evidence of early fusion.

## 2023-10-07 NOTE — Unmapped (Signed)
 Thank you for choosing [facility]!  We appreciate the opportunity to participate in your care.      If any questions or concerns arise after your visit, please do not hesitant to contact me by [facility] or by calling my clinical support, Irving Burton, at [phone].      Voicemail messages: Messages are checked between 8:00 am- 4:00 pm Monday- Friday.    MyChart messages: These messages are checked by the clinical staff during normal business hours 8:30 am-4:30 pm Monday-Friday every 24-48 hours and are for non-urgent, non-emergent concerns. You may be asked to return for a follow up visit if it is deemed your questions are best handled in the clinic setting. If you are not signed up on [facility], please call the [facility] at [phone] to receive help activating your account.    Please let me know if I can be of assistance with this or other orthopaedic issues in the future.

## 2023-10-11 ENCOUNTER — Emergency Department
Admit: 2023-10-11 | Discharge: 2023-10-12 | Disposition: A | Discharge status: Home / Self Care | Payer: PRIVATE HEALTH INSURANCE

## 2023-10-11 DIAGNOSIS — K50019 Crohn's disease of small intestine with unspecified complications: Principal | ICD-10-CM

## 2023-10-11 DIAGNOSIS — R1031 Right lower quadrant pain: Principal | ICD-10-CM

## 2023-10-11 LAB — URINALYSIS WITH MICROSCOPY WITH CULTURE REFLEX PERFORMABLE
BACTERIA: NONE SEEN /HPF
BILIRUBIN UA: NEGATIVE
BLOOD UA: NEGATIVE
GLUCOSE UA: NEGATIVE
KETONES UA: NEGATIVE
NITRITE UA: NEGATIVE
PH UA: 6.5 (ref 5.0–9.0)
RBC UA: <1 /HPF (ref <=3)
SPECIFIC GRAVITY UA: 1.029 (ref 1.003–1.030)
SQUAMOUS EPITHELIAL: 4 /HPF (ref 0–5)
UROBILINOGEN UA: <2
WBC UA: 19 /HPF — ABNORMAL HIGH (ref <=2)

## 2023-10-11 LAB — LIPASE: LIPASE: 57 U/L — ABNORMAL HIGH (ref 12–53)

## 2023-10-11 LAB — CBC W/ AUTO DIFF
BASOPHILS ABSOLUTE COUNT: 0.1 10*9/L (ref 0.0–0.1)
BASOPHILS RELATIVE PERCENT: 0.8 %
EOSINOPHILS ABSOLUTE COUNT: 0.2 10*9/L (ref 0.0–0.5)
EOSINOPHILS RELATIVE PERCENT: 2.4 %
HEMATOCRIT: 38.2 % — ABNORMAL LOW (ref 39.0–48.0)
HEMOGLOBIN: 12.7 g/dL — ABNORMAL LOW (ref 12.9–16.5)
LYMPHOCYTES ABSOLUTE COUNT: 2.6 10*9/L (ref 1.1–3.6)
LYMPHOCYTES RELATIVE PERCENT: 33.4 %
MEAN CORPUSCULAR HEMOGLOBIN CONC: 33.3 g/dL (ref 32.0–36.0)
MEAN CORPUSCULAR HEMOGLOBIN: 27.4 pg (ref 25.9–32.4)
MEAN CORPUSCULAR VOLUME: 82.3 fL (ref 77.6–95.7)
MEAN PLATELET VOLUME: 7.7 fL (ref 6.8–10.7)
MONOCYTES ABSOLUTE COUNT: 0.6 10*9/L (ref 0.3–0.8)
MONOCYTES RELATIVE PERCENT: 8.2 %
NEUTROPHILS ABSOLUTE COUNT: 4.3 10*9/L (ref 1.8–7.8)
NEUTROPHILS RELATIVE PERCENT: 55.2 %
PLATELET COUNT: 235 10*9/L (ref 150–450)
RED BLOOD CELL COUNT: 4.65 10*12/L (ref 4.26–5.60)
RED CELL DISTRIBUTION WIDTH: 15.4 % — ABNORMAL HIGH (ref 12.2–15.2)
WBC ADJUSTED: 7.8 10*9/L (ref 3.6–11.2)

## 2023-10-11 LAB — COMPREHENSIVE METABOLIC PANEL
ALBUMIN: 3 g/dL — ABNORMAL LOW (ref 3.4–5.0)
ALKALINE PHOSPHATASE: 171 U/L — ABNORMAL HIGH (ref 46–116)
ALT (SGPT): 14 U/L (ref 10–49)
ANION GAP: 15 mmol/L — ABNORMAL HIGH (ref 5–14)
AST (SGOT): 13 U/L (ref <=34)
BILIRUBIN TOTAL: 0.3 mg/dL (ref 0.3–1.2)
BLOOD UREA NITROGEN: 12 mg/dL (ref 9–23)
BUN / CREAT RATIO: 11
CALCIUM: 7.6 mg/dL — ABNORMAL LOW (ref 8.7–10.4)
CHLORIDE: 107 mmol/L (ref 98–107)
CO2: 24 mmol/L (ref 20.0–31.0)
CREATININE: 1.12 mg/dL (ref 0.73–1.18)
EGFR CKD-EPI (2021) MALE: 81 mL/min/{1.73_m2} (ref >=60)
GLUCOSE RANDOM: 94 mg/dL (ref 70–179)
POTASSIUM: 3.2 mmol/L — ABNORMAL LOW (ref 3.4–4.8)
PROTEIN TOTAL: 6 g/dL (ref 5.7–8.2)
SODIUM: 146 mmol/L — ABNORMAL HIGH (ref 135–145)

## 2023-10-11 LAB — C-REACTIVE PROTEIN: C-REACTIVE PROTEIN: <5 mg/L (ref <=10.0)

## 2023-10-11 NOTE — Unmapped (Signed)
 Hx Crones. Abd pain, sharp on right side. Reducible hernia over pelvis. From ostomy reversal a year ago. Asthma with SOB over last week, albuterol inhaler Q4. Didn't take it as much today. Emesis x2 in last hour. Endorses dizziness. Presented for same about a month ago.

## 2023-10-11 NOTE — Unmapped (Signed)
 [facility]  Emergency Department Provider Note     ED Clinical Impression     Final diagnoses:   Right lower quadrant abdominal pain (Primary)   Crohn's disease of small intestine with complication (CMS-HCC)      Impression, Medical Decision Making, ED Course     Impression: 48 y.o. 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  Upon presentation, patient is generally well-appearing, does not appear to be in any obvious acute distress at this time.  Vital signs stable and reassuring at this time.    Differential diagnosis includes but is not limited to Crohn's flare, SBO, viral gastroenteritis, appendicitis, incarcerated strangulated hernia.  Abdomen with reducible hernia, tenderness to palpation in RLL and guarding.  No rebound or rigidity noted.  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 Complicated by short gut syndrome, high ostomy output for which he underwent ileostomy takedown and reanastomosis presents to the ED with RLL abdominal pain, nausea, and vomiting.  Low concern for incarcerated or strangulated hernia at this time given patient's hernia is reducible, nontender, not erythematous and patient still having bowel movements. Potential Crohn's flare although patient denies recent missed doses of his Humira although states this feels similar to prior flares.  Although symptoms seem consistent with patient's prior Crohn's flares which occur in RLL, patient is extremely tender and guarding in RLL. Will get labs and CT abdomen to further evaluate.    Diagnostic workup as below. Will treat patient with pain control and antiemetics.    Orders Placed This Encounter   Procedures    Urine Culture    CT Abdomen Pelvis W IV Contrast    C-reactive protein    CBC w/ Differential    Comprehensive Metabolic Panel    Urinalysis with Microscopy with Culture Reflex    Lipase       ED Course as of 10/11/23 2209   Wynelle Link Oct 11, 2023   2000 Patient is a 48 year old male with past medical history of Crohn's presenting to the emergency department for right lower quadrant abdominal pain.   2019 Urinalysis with Microscopy with Culture Reflex(!):    Color, UA Yellow   Clarity, UA Clear   Spec Grav, UA 1.029   pH, UA 6.5   Leukocyte Esterase, UA Moderate(!)   Nitrite, UA Negative   Protein, UA Trace(!)   Glucose, UA Negative   Ketones, UA Negative   Urobilinogen, UA <2.0 mg/dL   Bilirubin, UA Negative   Blood, UA Negative   RBC, UA <1   WBC, UA 19(!)   Squam Epithel, UA 4   Bacteria, UA None Seen   Mucus, UA Rare(!)   2207 Patient stating he is feeling better after the second dose of Dilaudid and would like to leave at this time.  States he does not need CT scan.  Discussed that we cannot verify he does not have intra-abdominal pathology at this time.  He is understanding of this plan and would like to leave and follow-up with his PCP.  Given strict ED return precautions and discharged in stable condition.       MDM Elements  I have reviewed recent and relevant previous record, including: Outpatient notes - reviewed prior note from February patient presented to the ED with similar symptoms    Social Determinants that significantly affected care: None Available      ____________________________________________    The case was discussed with the attending physician, who is in agreement with the above assessment and plan.      History     Chief Complaint  Chief Complaint   Patient presents with    Abdominal Pain       HPI   Jerome Kennedy. is a 48 y.o. male with past medical history as below who presents with right-sided abdominal pain, nausea, and vomiting. The patient reports right-sided abdominal pain since last night with associated nausea and 2 episodes of non bloody, greenish emesis that occurred this morning. He states these symptoms feel consistent with his past Crohn's flares. He also endorses chills last night, of which he took Tylenol with mild relief. He states he is continuing to have loose stool bowel movements that are unchanged. He denies any recent sick contacts. Per chart review, he was last seen in this ED on 09/28/2023 for similar symptoms where his labs showed elevated CRP to 31 and elevated creatinine to 1.26. He was recommended to be admitted for rehydration and evaluation of CT abdomen, however patient requested to be discharged and follow up outpatient. He states since being discharged, he was been doing okay until his symptoms flared up again. Patient also reports last seeing his GI doctor last month. Of note, patient is on Humira for Crohn's and has not missed any doses recently. His next dose is scheduled for tomorrow. Abdominal surgical history includes cholecystectomy, right hemicolectomy, end ileostomy, and ileocecectomy. Denies fevers, dysuria, or urinary retention.    Outside Historian(s): I have obtained additional history/collateral from no one.    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    Myalgia     Rhinovirus infection 10/29/2022    SBO (small bowel obstruction) (CMS-HCC) 2011       Past Surgical History:   Procedure Laterality Date    CHOLECYSTECTOMY      COLON SURGERY      partial resection    HERNIA REPAIR      PR ARTHRP INTERCARPAL/CARP/MTCRPL JT INTERPOSITION Left 08/07/2022    Procedure: INTERPOSIT ARTHROPLASTY-INTERCARPAL/CARPOMETACAR;  Surgeon: [name], MD;  Location: MAIN OR [facility];  Service: Orthopedics    PR CLOSE ENTEROSTOMY,RESEC+COLOREC ANAS Midline 11/01/2019    Procedure: CLO ENTEROSTOMY; W/RESECT Daleen Snook;  Surgeon: Claretta Fraise, MD;  Location: MAIN OR [place];  Service: Gastrointestinal    PR COLONOSCOPY FLX DX W/COLLJ SPEC WHEN PFRMD Left 01/07/2013    Procedure: COLONOSCOPY, FLEXIBLE, PROXIMAL TO SPLENIC FLEXURE; DIAGNOSTIC, W/WO COLLECTION SPECIMEN BY BRUSH OR WASH;  Surgeon: Tish Men, MD;  Location: [facility];  Service: Gastroenterology    PR COLONOSCOPY FLX DX W/COLLJ SPEC WHEN PFRMD  03/09/2015    Procedure: COLONOSCOPY, FLEXIBLE, PROXIMAL TO SPLENIC FLEXURE; DIAGNOSTIC, W/WO COLLECTION SPECIMEN BY BRUSH OR WASH;  Surgeon: Cletis Athens, MD;  Location: GI PROCEDURES [facility];  Service: Gastroenterology    PR COLONOSCOPY FLX DX W/COLLJ SPEC WHEN PFRMD N/A 10/28/2019    Procedure: COLONOSCOPY, FLEXIBLE, PROXIMAL TO SPLENIC FLEXURE; DIAGNOSTIC, W/WO COLLECTION SPECIMEN BY BRUSH OR WASH;  Surgeon: Beverly Milch, MD;  Location: GI PROCEDURES [facility];  Service: Gastroenterology    PR COLONOSCOPY FLX DX W/COLLJ SPEC WHEN PFRMD N/A 10/01/2022    Procedure: COLONOSCOPY, FLEXIBLE, PROXIMAL TO SPLENIC FLEXURE; DIAGNOSTIC, W/WO COLLECTION SPECIMEN BY BRUSH OR WASH;  Surgeon: Zetta Bills, MD;  Location: GI [facility]URES [facility];  Service: Gastroenterology    PR EXPLORATORY OF ABDOMEN Midline 09/02/2019    Procedure: EXPLORATORY LAPAROTOMY, EXPLORATORY CELIOTOMY WITH OR WITHOUT BIOPSY(S);  Surgeon: Claretta Fraise, MD;  Location: MAIN OR [facility];  Service: Gastrointestinal    PR EXPLORATORY OF ABDOMEN Midline 11/01/2019    Procedure: EXPLORATORY LAPAROTOMY, EXPLORATORY CELIOTOMY WITH OR WITHOUT BIOPSY(S);  Surgeon: Claretta Fraise, MD;  Location: MAIN OR [facility];  Service: Gastrointestinal    PR FIX FINGER,VOLAR PLATE,I-P JT Left 08/07/2022    Procedure: REPAIR AND RECONSTRUCTION, FINGER, VOLAR PLATE, INTERPHALANGEAL JOINT;  Surgeon: [name], MD;  Location: MAIN OR [facility];  Service: Orthopedics    PR FREEING BOWEL ADHESION,ENTEROLYSIS N/A 11/01/2019    Procedure: Enterolysis (Separt Proc);  Surgeon: Claretta Fraise, MD;  Location: MAIN OR [facility];  Service: Gastrointestinal    PR FUSION MC-P JT Left 08/27/2023    Procedure: ARTHRODESIS, METACARPOPHALANGEAL JOINT, WITH OR WITHOUT INTERNAL FIXATION;  Surgeon: [name], MD;  Location: OR [facility];  Service: Orthopedics    PR ILEOSCOPY THRU STOMA,BIOPSY N/A 10/14/2019    Procedure: Karren Cobble; [place] Saa 1/MX;  Surgeon: Neysa Hotter, MD;  Location: GI PROCEDURES [facility];  Service: Gastroenterology    PR PART REMOVAL COLON W COLOSTOMY Midline 09/02/2019    Procedure: COLECTOMY, PARTIAL; WITH SKIN LEVEL CECOSTOMY OR COLOSTOMY;  Surgeon: Claretta Fraise, MD;  Location: MAIN OR [facility];  Service: Gastrointestinal    PR REPAIR INCISIONAL HERNIA,REDUCIBLE Midline 09/02/2019    Procedure: REPAIR INIT INCISIONAL OR VENTRAL HERNIA; REDUCIBLE;  Surgeon: Kristopher Oppenheim, MD;  Location: MAIN OR [place];  Service: Trauma    PR UPPER GI ENDOSCOPY,BIOPSY N/A 01/07/2013    Procedure: UGI ENDOSCOPY; WITH BIOPSY, SINGLE OR MULTIPLE;  Surgeon: Tish Men, MD;  Location: GI PROC[facility]URES [facility];  Service: Gastroenterology    PR UPPER GI ENDOSCOPY,BIOPSY N/A 10/14/2019    Procedure: UGI ENDOSCOPY; WITH BIOPSY, SINGLE OR MULTIPLE;  Surgeon: Neysa Hotter, MD;  Location: GI PROC[facility]URES [facility];  Service: Gastroenterology    PR UPPER GI ENDOSCOPY,DIAGNOSIS N/A 10/01/2022    Procedure: UGI ENDO, INCLUDE ESOPHAGUS, STOMACH, & DUODENUM &/OR JEJUNUM; DX W/WO COLLECTION SPECIMN, BY BRUSH OR WASH;  Surgeon: Zetta Bills, MD;  Location: GI PROC[facility]URES [facility];  Service: Gastroenterology       No current facility-administered medications for this encounter.    Current Outpatient Medications:     cholecalciferol, vitamin D3 25 mcg, 1,000 units,, 1,000 unit (25 mcg) tablet, Take 1 tablet (25 mcg total) by mouth daily., Disp: 30 tablet, Rfl: 0    dicyclomine (BENTYL) 10 mg capsule, Take 1 capsule (10 mg total) by mouth Four (4) times a day (before meals and nightly)., Disp: 120 capsule, Rfl: 3    empty container Misc, Use as directed, Disp:  1 each, Rfl: 3    HUMIRA PEN CITRATE FREE 40 MG/0.4 ML, Inject the contents of 1 pen (40 mg total) under the skin every seven (7) days., Disp: 4 each, Rfl: 5    OPTICHAMBER DIAMOND V[facility] Spcr, , Disp: , Rfl:     pantoprazole (PROTONIX) 40 MG tablet, Take 1 tablet (40 mg total) by mouth daily., Disp: 90 tablet, Rfl: 0    polyethylene glycol (MIRALAX) 17 gram packet, Take 17 g (1 packet in 4-8 oz of liquid)  by mouth daily., Disp: 90 packet, Rfl: 3    QUEtiapine (SEROQUEL) 100 MG tablet, Take 1 tablet (100 mg total) by mouth nightly., Disp: , Rfl:     Allergies  Morphine and Toradol [ketorolac]    Family History  Family History   Problem Relation Age of Onset    Breast cancer Mother     Diabetes Maternal Aunt     Colon cancer Maternal Grandmother     Prostate cancer Maternal Grandfather     Cancer Paternal Grandmother     Crohn's disease Neg Hx        Social History  Social History     Tobacco Use    Smoking status: Every Day     Current packs/day: 0.30     Average packs/day: 0.5 packs/day for 22.0 years (10.4 ttl pk-yrs)     Types: Cigarettes     Start date: 10/02/2001     Passive exposure: Current    Smokeless tobacco: Never    Tobacco comments:     Reduced use from 10cpd to 5cpd, though relights each one. Motivated to quit.    Vaping Use    Vaping status: Some Days    Substances: Nicotine    Passive vaping exposure: Yes   Substance Use Topics    Alcohol use: No     Alcohol/week: 0.0 standard drinks of alcohol    Drug use: Yes     Types: Marijuana     Comment: Smokes 2-3 joints/day        Physical Exam     VITAL SIGNS:      Vitals:    10/11/23 1906   BP: 104/78   Pulse: 85   Resp: 18   Temp: 36.7 ??C (98.1 ??F)   TempSrc: Oral   SpO2: 98%   Weight: 68.5 kg (151 lb 0.2 oz)       Constitutional: Alert and oriented. No acute distress.  Eyes: Conjunctivae are normal.  HEENT: Normocephalic and atraumatic. Conjunctivae clear. No congestion. Moist mucous membranes.   Cardiovascular: Rate as above, regular rhythm. Normal and symmetric distal pulses. Brisk capillary refill. Normal skin turgor.  Respiratory: Normal respiratory effort. Breath sounds are normal. There are no wheezing or crackles heard.  Gastrointestinal: Soft, non-distended, tenderness to palpation and guarding in RLL.  Genitourinary: Deferred.  Musculoskeletal: Non-tender with normal range of motion in all extremities.  Neurologic: Normal speech and language. No gross focal neurologic deficits are appreciated. Patient is moving all extremities equally, face is symmetric at rest and with speech.  Skin: Skin is warm, dry and intact. No rash noted.  Psychiatric: Mood and affect are normal. Speech and behavior are normal.     Radiology     CT Abdomen Pelvis W IV Contrast    (Results Pending)       Pertinent labs & imaging results that were available during my care of the patient were independently interpreted by me and considered in my medical decision making (see  chart for details).    Portions of this record have been created using Scientist, clinical (histocompatibility and immunogenetics). Dictation errors have been sought, but may not have been identified and corrected.    Documentation assistance was provided by Altamese Cabal, Scribe, on October 11, 2023 at 7:27 PM for Neva Seat, MD.    Scribe's Attestation: The NP/PA or resident/attending physician obtained and performed the history, physical exam and medical decision making elements that were entered into the chart by me.  Signed by Neva Seat, DO on October 11, 2023 at 10:10 PM.      Neva Seat, DO  Resident  10/11/23 2210

## 2023-10-12 MED ADMIN — prochlorperazine (COMPAZINE) injection 2.5 mg: 2.5 mg | INTRAVENOUS | Stop: 2023-10-11

## 2023-10-12 MED ADMIN — HYDROmorphone (PF) (DILAUDID) injection 0.3 mg: .3 mg | INTRAVENOUS | @ 02:00:00 | Stop: 2023-10-11

## 2023-10-12 MED ADMIN — HYDROmorphone (PF) (DILAUDID) injection 0.5 mg: .5 mg | INTRAVENOUS | Stop: 2023-10-11

## 2023-10-12 MED ADMIN — diphenhydrAMINE (BENADRYL) capsule/tablet 25 mg: 25 mg | ORAL | @ 01:00:00 | Stop: 2023-10-11

## 2023-10-13 MED ORDER — CIPROFLOXACIN 500 MG TABLET
ORAL_TABLET | Freq: Two times a day (BID) | ORAL | 0 refills | 7.00 days | Status: CP
Start: 2023-10-13 — End: 2023-10-20

## 2023-10-13 NOTE — Unmapped (Signed)
 Urine Culture, Comprehensive   10,000 to 50,000 CFU/mL Proteus mirabilis Abnormal   Susceptibility Testing By Consultation Only    Specimen Source: Clean Catch      Resulting Agency: [facility]    Attempt to reach patient, no answer.  Generic message left.

## 2023-10-13 NOTE — Unmapped (Signed)
 EMAP test result follow-up note    October 13, 2023 5:56 PM     I was contacted by the ED resource nurse Belenda Cruise [facility]) with regard to [employer]. He was seen in the [facility] Emergency Department on 10/11/2023 at which time a clean catch urine culture was done which has returned with the following result:    Urine Culture, Comprehensive 10,000 to 50,000 CFU/mL Proteus mirabilis Abnormal    Susceptibility Testing By Consultation Only      Specimen Source: Clean Catch        I have reviewed the patient's chart from this ED visit.  48 year old male, PMH Crohn's, presenting with abdominal pain.  Urinalysis with moderate leuk esterase, 19 WBC, no bacteria.  Patient prior to completing CT scan and full ED workup, initial concern for possible complication of IBD.  No antibiotics started at discharge.  I I have sent a prescription for ciprofloxacin to the patient's preferred pharmacy ([facility] in [place]).  I attempted to contact the patient and update him on the finding but 1 is unable to reach him.    The ED resource nurse will contact the patient, inform him of the positive test result(s) and need to complete a short course of ciprofloxacin as prescribed. Return precautions should be reviewed and he should be instructed to follow-up as directed at the time of his ED visit.

## 2023-10-14 NOTE — Unmapped (Signed)
 Urine Culture  Order: [id number] - Reflex for Order [id number]   Status: Final result    Test Result Released: No    Specimen Information: Clean Catch; Urine  View Coordination for this Result      View Follow-Up Encounter  Urine Culture, Comprehensive   10,000 to 50,000 CFU/mL Proteus mirabilis Abnormal   Susceptibility Testing By Consultation Only    Specimen Source: Clean Catch      Resulting Agency: [facility]          Attempted to reach pt to inform him of results and new antibiotic prescription. No answer to listed number.

## 2023-10-15 NOTE — Unmapped (Signed)
 Attempted to reach pt regarding results, no answer

## 2023-10-18 ENCOUNTER — Emergency Department
Admit: 2023-10-18 | Discharge: 2023-10-19 | Disposition: A | Discharge status: Home / Self Care | Payer: PRIVATE HEALTH INSURANCE

## 2023-10-18 LAB — C-REACTIVE PROTEIN: C-REACTIVE PROTEIN: 12.3 mg/L — ABNORMAL HIGH (ref <=10.0)

## 2023-10-18 LAB — URINALYSIS WITH MICROSCOPY WITH CULTURE REFLEX PERFORMABLE
BACTERIA: NONE SEEN /HPF
BILIRUBIN UA: NEGATIVE
BLOOD UA: NEGATIVE
GLUCOSE UA: NEGATIVE
KETONES UA: NEGATIVE
LEUKOCYTE ESTERASE UA: NEGATIVE
NITRITE UA: NEGATIVE
PH UA: 6.5 (ref 5.0–9.0)
RBC UA: 2 /HPF (ref <=3)
SPECIFIC GRAVITY UA: 1.026 (ref 1.003–1.030)
SQUAMOUS EPITHELIAL: 5 /HPF (ref 0–5)
UROBILINOGEN UA: <2
WBC UA: 1 /HPF (ref <=2)

## 2023-10-18 LAB — LIPASE: LIPASE: 47 U/L (ref 12–53)

## 2023-10-18 LAB — HIGH SENSITIVITY TROPONIN I - SINGLE: HIGH SENSITIVITY TROPONIN I: <3 ng/L (ref <=53)

## 2023-10-18 LAB — CBC W/ AUTO DIFF
BASOPHILS ABSOLUTE COUNT: 0.1 10*9/L (ref 0.0–0.1)
BASOPHILS RELATIVE PERCENT: 1.3 %
EOSINOPHILS ABSOLUTE COUNT: 0.1 10*9/L (ref 0.0–0.5)
EOSINOPHILS RELATIVE PERCENT: 2.1 %
HEMATOCRIT: 37.4 % — ABNORMAL LOW (ref 39.0–48.0)
HEMOGLOBIN: 12.2 g/dL — ABNORMAL LOW (ref 12.9–16.5)
LYMPHOCYTES ABSOLUTE COUNT: 1.8 10*9/L (ref 1.1–3.6)
LYMPHOCYTES RELATIVE PERCENT: 30.2 %
MEAN CORPUSCULAR HEMOGLOBIN CONC: 32.6 g/dL (ref 32.0–36.0)
MEAN CORPUSCULAR HEMOGLOBIN: 27.1 pg (ref 25.9–32.4)
MEAN CORPUSCULAR VOLUME: 83.1 fL (ref 77.6–95.7)
MEAN PLATELET VOLUME: 8.1 fL (ref 6.8–10.7)
MONOCYTES ABSOLUTE COUNT: 0.4 10*9/L (ref 0.3–0.8)
MONOCYTES RELATIVE PERCENT: 6.5 %
NEUTROPHILS ABSOLUTE COUNT: 3.5 10*9/L (ref 1.8–7.8)
NEUTROPHILS RELATIVE PERCENT: 59.9 %
PLATELET COUNT: 204 10*9/L (ref 150–450)
RED BLOOD CELL COUNT: 4.5 10*12/L (ref 4.26–5.60)
RED CELL DISTRIBUTION WIDTH: 15.2 % (ref 12.2–15.2)
WBC ADJUSTED: 5.9 10*9/L (ref 3.6–11.2)

## 2023-10-18 LAB — COMPREHENSIVE METABOLIC PANEL
ALBUMIN: 3 g/dL — ABNORMAL LOW (ref 3.4–5.0)
ALKALINE PHOSPHATASE: 173 U/L — ABNORMAL HIGH (ref 46–116)
ALT (SGPT): 12 U/L (ref 10–49)
ANION GAP: 19 mmol/L — ABNORMAL HIGH (ref 5–14)
AST (SGOT): 23 U/L (ref <=34)
BILIRUBIN TOTAL: 0.4 mg/dL (ref 0.3–1.2)
BLOOD UREA NITROGEN: 7 mg/dL — ABNORMAL LOW (ref 9–23)
BUN / CREAT RATIO: 7
CALCIUM: 7.8 mg/dL — ABNORMAL LOW (ref 8.7–10.4)
CHLORIDE: 108 mmol/L — ABNORMAL HIGH (ref 98–107)
CO2: 16 mmol/L — ABNORMAL LOW (ref 20.0–31.0)
CREATININE: 1.05 mg/dL (ref 0.73–1.18)
EGFR CKD-EPI (2021) MALE: 88 mL/min/{1.73_m2} (ref >=60)
GLUCOSE RANDOM: 112 mg/dL (ref 70–179)
POTASSIUM: 3.9 mmol/L (ref 3.4–4.8)
PROTEIN TOTAL: 6.4 g/dL (ref 5.7–8.2)
SODIUM: 143 mmol/L (ref 135–145)

## 2023-10-18 LAB — APTT
APTT: 22.4 s — ABNORMAL LOW (ref 24.8–38.4)
HEPARIN CORRELATION: <0.2

## 2023-10-18 LAB — SEDIMENTATION RATE: ERYTHROCYTE SEDIMENTATION RATE: 17 mm/h — ABNORMAL HIGH (ref 0–15)

## 2023-10-18 LAB — PROTIME-INR
INR: 0.99
PROTIME: 11.3 s (ref 9.9–12.6)

## 2023-10-18 LAB — LACTATE, VENOUS, WHOLE BLOOD: LACTATE BLOOD VENOUS: 1.5 mmol/L (ref 0.5–1.8)

## 2023-10-18 LAB — PRO-BNP: PRO-BNP: 100 pg/mL (ref <=300.0)

## 2023-10-18 MED ADMIN — iohexol (OMNIPAQUE) 350 mg iodine/mL solution 100 mL: 100 mL | INTRAVENOUS | @ 19:00:00 | Stop: 2023-10-18

## 2023-10-18 MED ADMIN — prochlorperazine (COMPAZINE) injection 2.5 mg: 2.5 mg | INTRAVENOUS | @ 16:00:00 | Stop: 2023-10-18

## 2023-10-18 MED ADMIN — diphenhydrAMINE (BENADRYL) injection 25 mg: 25 mg | INTRAVENOUS | @ 16:00:00 | Stop: 2023-10-18

## 2023-10-18 MED ADMIN — HYDROmorphone (PF) (DILAUDID) injection 0.3 mg: .3 mg | INTRAVENOUS | @ 18:00:00 | Stop: 2023-10-18

## 2023-10-18 MED ADMIN — diphenhydrAMINE (BENADRYL) capsule/tablet 50 mg: 50 mg | ORAL | @ 23:00:00 | Stop: 2023-10-18

## 2023-10-18 MED ADMIN — HYDROmorphone (PF) (DILAUDID) injection 0.5 mg: 0.5 mg | INTRAVENOUS | @ 16:00:00 | Stop: 2023-10-18

## 2023-10-18 MED ADMIN — diphenhydrAMINE (BENADRYL) injection 25 mg: 25 mg | INTRAVENOUS | @ 20:00:00 | Stop: 2023-10-18

## 2023-10-18 MED ADMIN — lactated ringers bolus 1,000 mL: 1000 mL | INTRAVENOUS | @ 17:00:00 | Stop: 2023-10-18

## 2023-10-18 MED ADMIN — oxyCODONE (ROXICODONE) immediate release tablet 10 mg: 10 mg | ORAL | @ 23:00:00 | Stop: 2023-10-18

## 2023-10-18 MED ADMIN — HYDROmorphone (PF) (DILAUDID) injection 0.3 mg: .3 mg | INTRAVENOUS | @ 21:00:00 | Stop: 2023-10-18

## 2023-10-18 NOTE — Unmapped (Signed)
 ED Progress Note    Chief Complaint   Patient presents with    Abdominal Pain     Received sign out from previous provider.  Briefly, patient has history of Crohn's (takes Humira per chart review from ED visit in March) and is status post ileocecectomy in 2003 presenting with symptoms consistent with his IBD flareups but found on imaging to have possible concern for proximal bilateral DVTs, he is requiring repeat Dilaudid for pain management.  Also received Benadryl for management of his pruritic excoriations.  VIR has been involved and is following along.  [ ]  CTA  [ ]  PVL  [ ]  De-escalate pain plan so he can get home    ED Course as of 10/18/23 2355   Sun Oct 18, 2023   1841 Patient requesting more pain medication, will try to transition to p.o., ordering 10 of oxy.  Patient also endorsing itching, ordered 50 of p.o. Benadryl.   2043 PVL Venous Duplex Lower Extremity Bilateral  PL shows no evidence of DVT in either extremity   2234 CTA Chest W Contrast  No evidence of acute pulmonary embolus.     Mild nonspecific bronchial wall thickening with areas of bronchial branch opacification which can be seen with bronchitis or aspiration   2350 Workup has been unremarkable, patient now tolerating p.o. pain medications, no emergent need for patient to stay and be hospitalized, will have him follow-up in the outpatient setting.  Patient states he has a colonoscopy in the next few weeks and a GI doctor that he follows with.  I advised him to follow-up in that setting for his symptoms.  Patient discharging in stable condition.

## 2023-10-18 NOTE — Unmapped (Addendum)
 VIR BRIEF NOTE     Received phone call from Dr. Para March Re: Possible BLE DVTs.     Concern is based 2/2 CT AP Prelim. Review of the imaging shows contrast mixing in the IVC and iliac veins. No definite DVTs noted. Patient is without BLE swelling or discoloration.     As such:    Recommendations:   - Get BLE DVT study (duplex study). If DVT present, page VIR for further recommendations. May need heperin gtt and CT AP venogram w/ run off     All parties in agreement.       Rosalio Loud, MD  Assistant Professor of Radiology  Division of Vascular and Interventional Radiology  [place] [facility]

## 2023-10-18 NOTE — Unmapped (Signed)
 Endorsing RLQ abdominal pain started 1 hour ago. With vomiting.  Denies diarrhea.  Has taken tylenol with no relief. Known with Crohn's Disease.

## 2023-10-18 NOTE — Unmapped (Signed)
 [facility]  Emergency Department Provider Note     ED Clinical Impression     Final diagnoses:   Right lower quadrant abdominal pain (Primary)   Nausea and vomiting, unspecified vomiting type      Impression, Medical Decision Making, ED Course     Impression: 48 y.o. 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     Here, patient's vitals signs are within normal limits and are stable. On exam, patient is non-toxic appearing and in no acute distress. Diffuse abdominal tenderness worse in the RLQ with guarding. Dry mucus membranes. Small area of erythema with scattered papules to the radial aspect of the L wrist. Some excoriations over this area.     DDx/MDM: Concern for Crohn's flare, potential perforation, viral gastroenteritis/colitis. Also concerned for separate possible contact dermatitis on the left wrist.    Diagnostic workup as below. Will treat patient with Benadryl, Dilaudid, IV fluids, and compazine.    Orders Placed This Encounter   Procedures    CBC w/ Differential    Comprehensive Metabolic Panel    Lipase    Urinalysis with Microscopy with Culture Reflex       ED Course as of 10/18/23 1720   Sun Oct 18, 2023   1138 UA with trace proteinuria and occasional mucus, otherwise unremarkable.  Not consistent with UTI/pyelonephritis   1200 CRP(!): 12.3  Acutely elevated from 7 days ago   1201 Anion Gap(!): 19   1201 CMP with low bicarb at 16, anion gap 19, slightly elevated chloride of 108, slightly low calcium at 7.8, low albumin at 3.0   1202 CRP(!): 12.3  At baseline from 7 days ago   1307 HGB(!): 12.2  Very slightly down from baseline   1311 Sed Rate(!): 17   1352 Patient requesting more pain medications.  Ordered Dilaudid again, but 0.3 mg   1537 Received message from radiology, patient's CT abdomen pelvis showed extensive bilateral DVT. Occlusive at the distal R external iliac vein and distally to the end of the field of view (proximal femoral vein), and nonocclusive at the bilateral common iliac veins   1537 CT Abdomen Pelvis W IV Contrast Only  1.  Occlusive deep venous thrombus of the distal right external iliac vein to the level of the partially imaged proximal femoral vein.  2.  Nonocclusive deep venous thrombus of the right common iliac vein.  3.  Nonocclusive deep venous thrombus the left common iliac vein to the level of the partially imaged proximal femoral vein.     1547 Updated patient on his imaging findings of multiple bilateral DVTs, patient denies any leg pain.  Did extensive lower extremity exam, patient has no cerulea dolens, chest pain strong bilateral femoral pulses, palpable DP and PT pulses bilaterally.  No tenderness.  Poor cap refill bilateral feet.  Patient does now note that he has had some mild shortness of breath over the past week to week and a half.   [id number] Called radiology to clarify, they state that patient can get a CTA chest to rule out pulmonary embolism.  Ordered heparin, CTA chest, and will page VIR to discuss potential thrombectomy/intervention   1554 Ordered PVLs to better  evaluate extent of clot burden   1559 Radiology attending messaged me prior to finalizing report, she states that she would be hesitant to call these findings DVTs due to limited value of CTAP in evaluating for DVT. Also, there is a newly inflamed loops of bowel in the left hemiabdomen possibly the neoterminal ileum concerning for acute inflammation.  Will hold off on heparin at this time.  Still plan for CTA chest, PVLs, POCUS   1600 Reviewed EKG, normal sinus rhythm with a heart rate of 69, normal axis, normal intervals, no significant ST elevation or depressions, no notable T wave changes   1617 CT Abdomen Pelvis W IV Contrast Only  1.  Increased thickening and enhancement of the neoterminal ileum concerning for active inflammation without associated bowel obstruction.  2.  Limited evaluation of the venous structures is limited due to mixing artifact and underlying DVT is difficult to exclude. Recommend starting with dedicated lower extremity venous duplex ultrasound versus CT venogram for further characterization.     1617 Did bedside POCUS, difficult to obtain apical four-chamber view, but able to obtain other views without significant issue.  No pericardial effusion, no significant D sign, possible small amount of right ventricular dilation, no appreciable McConnell sign.  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  Further information as noted in study review.   1652 PRO-BNP: 100.0   1652 hsTroponin I: <3   1718 Signed out to oncoming provider       MDM Elements  Discussion of Management with other Physicians, QHP or Appropriate Source: Consultant - discussed with Radiology and VIR as noted above in ED course and discussed at length with my attending  Independent Interpretation of Studies: EKG(s) - as noted above and Ultrasound(s) - as noted above and in study review  I have reviewed recent and relevant previous record, including: Outpatient notes - Ortho note from 10/07/23    Social Determinants that significantly affected care: None      ____________________________________________    The case was discussed with the attending physician, who is in agreement with the above assessment and plan.      History     Chief Complaint  Chief Complaint   Patient presents with    Abdominal Pain       HPI   Jerome Kennedy. is a 48 y.o. male with past medical history as below who presents with 1.5 hours of RLQ abdominal pain associated with 2 episodes of NBNB emesis and watery NB stools. He took Tylenol without relief of his pain. Upon interview, he endorses chills, but no measured fevers. He reports that his symptoms are similar to when he's had Crohn's flares in the past. He has a known hernia. He has a history of similar pain and was seen at this ED 1 week ago for this. He was given Dilaudid with resolution of his pain and opted to leave prior to CT imaging. He also reports 1 week of a pruritic rash to his L forearm. No history of similar rashes. No recent exposure to the woods. No new detergents or ointments. Denies penile discharge.     Outside Historian(s): I have obtained additional history/collateral from none.    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    Myalgia     Rhinovirus infection 10/29/2022    SBO (small bowel obstruction) (CMS-HCC) 2011       Past Surgical History:   Procedure Laterality Date    CHOLECYSTECTOMY      COLON SURGERY      partial resection    HERNIA REPAIR      PR ARTHRP INTERCARPAL/CARP/MTCRPL JT INTERPOSITION Left 08/07/2022    Procedure: INTERPOSIT ARTHROPLASTY-INTERCARPAL/CARPOMETACAR;  Surgeon: [name], MD;  Location: MAIN OR [facility];  Service: Orthopedics    PR CLOSE ENTEROSTOMY,RESEC+COLOREC ANAS Midline 11/01/2019    Procedure: CLO ENTEROSTOMY; W/RESECT Daleen Snook;  Surgeon: Claretta Fraise, MD;  Location: MAIN OR [place];  Service: Gastrointestinal    PR COLONOSCOPY FLX DX W/COLLJ SPEC WHEN PFRMD Left 01/07/2013    Procedure: COLONOSCOPY, FLEXIBLE, PROXIMAL TO SPLENIC FLEXURE; DIAGNOSTIC, W/WO COLLECTION SPECIMEN BY BRUSH OR WASH;  Surgeon: Tish Men, MD;  [facility]: [facility];  Service: Gastroenterology    PR COLONOSCOPY FLX DX W/COLLJ SPEC WHEN PFRMD  03/09/2015    Procedure: COLONOSCOPY, FLEXIBLE, PROXIMAL TO SPLENIC FLEXURE; DIAGNOSTIC, W/WO COLLECTION SPECIMEN BY BRUSH OR WASH;  Surgeon: Cletis Athens, MD;  Location: GI [facility]URES [facility];  Service: Gastroenterology    PR COLONOSCOPY FLX DX W/COLLJ SPEC WHEN PFRMD N/A 10/28/2019    Procedure: COLONOSCOPY, FLEXIBLE, PROXIMAL TO SPLENIC FLEXURE; DIAGNOSTIC, W/WO COLLECTION SPECIMEN BY BRUSH OR WASH;  Surgeon: Beverly Milch, MD;  Location: GI [facility]URES [facility];  Service: Gastroenterology    PR COLONOSCOPY FLX DX W/COLLJ SPEC WHEN PFRMD N/A 10/01/2022    Procedure: COLONOSCOPY, FLEXIBLE, PROXIMAL TO SPLENIC FLEXURE; DIAGNOSTIC, W/WO COLLECTION SPECIMEN BY BRUSH OR WASH;  Surgeon: Zetta Bills, MD;  Location: GI PROC[facility]URES [facility];  Service: Gastroenterology    PR EXPLORATORY OF ABDOMEN Midline 09/02/2019    Procedure: EXPLORATORY LAPAROTOMY, EXPLORATORY CELIOTOMY WITH OR WITHOUT BIOPSY(S);  Surgeon: Claretta Fraise, MD;  Location: MAIN OR [facility];  Service: Gastrointestinal    PR EXPLORATORY OF ABDOMEN Midline 11/01/2019    Procedure: EXPLORATORY LAPAROTOMY, EXPLORATORY CELIOTOMY WITH OR WITHOUT BIOPSY(S);  Surgeon: Claretta Fraise, MD;  Location: MAIN OR [facility];  Service: Gastrointestinal    PR FIX FINGER,VOLAR PLATE,I-P JT Left 08/07/2022    Procedure: REPAIR AND RECONSTRUCTION, FINGER, VOLAR PLATE, INTERPHALANGEAL JOINT;  Surgeon: [name], MD;  Location: MAIN OR [facility];  Service: Orthopedics    PR FREEING BOWEL ADHESION,ENTEROLYSIS N/A 11/01/2019    Procedure: Enterolysis (Separt Proc);  Surgeon: Claretta Fraise, MD;  Location[facility];  Service: Gastrointestinal    PR FUSION MC-P JT Left 08/27/2023    Procedure: ARTHRODESIS, METACARPOPHALANGEAL JOINT, WITH OR WITHOUT INTERNAL FIXATION;  Surgeon: [name], MD;  Location: OR [facility];  Service: Orthopedics    PR ILEOSCOPY THRU STOMA,BIOPSY N/A 10/14/2019    Procedure: Karren Cobble; Marquette Saa 1/MX;  Surgeon: Neysa Hotter, MD;  Location: GI [facility]URES [facility];  Service: Gastroenterology    PR PART REMOVAL COLON W COLOSTOMY Midline 09/02/2019    Procedure: COLECTOMY, PARTIAL; WITH SKIN LEVEL CECOSTOMY OR COLOSTOMY;  Surgeon: Claretta Fraise, MD;  Location: MAIN OR [place];  Service: Gastrointestinal    PR REPAIR INCISIONAL HERNIA,REDUCIBLE Midline  09/02/2019    Procedure: REPAIR INIT INCISIONAL OR VENTRAL HERNIA; REDUCIBLE;  Surgeon: Kristopher Oppenheim, MD;  Location: MAIN OR [place];  Service: Trauma    PR UPPER GI ENDOSCOPY,BIOPSY N/A 01/07/2013    Procedure: UGI ENDOSCOPY; WITH BIOPSY, SINGLE OR MULTIPLE;  Surgeon: Tish Men, MD;  Location: [facility];  Service: Gastroenterology    PR UPPER GI ENDOSCOPY,BIOPSY N/A 10/14/2019    Procedure: UGI ENDOSCOPY; WITH BIOPSY, SINGLE OR MULTIPLE;  Surgeon: Neysa Hotter, MD;  Location: GI [facility]URES [facility];  Service: Gastroenterology    PR UPPER GI ENDOSCOPY,DIAGNOSIS N/A 10/01/2022    Procedure: UGI ENDO, INCLUDE ESOPHAGUS, STOMACH, & DUODENUM &/OR JEJUNUM; DX W/WO COLLECTION SPECIMN, BY BRUSH OR WASH;  Surgeon: Zetta Bills, MD;  Location: GI [facility]URES [facility];  Service: Gastroenterology       No current facility-administered medications for this encounter.    Current Outpatient Medications:     cholecalciferol, vitamin D3 25 mcg, 1,000 units,, 1,000 unit (25 mcg) tablet, Take 1 tablet (25 mcg total) by mouth daily., Disp: 30 tablet, Rfl: 0    ciprofloxacin HCl (CIPRO) 500 MG tablet, Take 1 tablet (500 mg total) by mouth two (2) times a day for 7 days., Disp: 14 tablet, Rfl: 0    dicyclomine (BENTYL) 10 mg capsule, Take 1 capsule (10 mg total) by mouth Four (4) times a day (before meals and nightly)., Disp: 120 capsule, Rfl: 3    empty container Misc, Use as directed, Disp: 1 each, Rfl: 3    HUMIRA PEN CITRATE FREE 40 MG/0.4 ML, Inject the contents of 1 pen (40 mg total) under the skin every seven (7) days., Disp: 4 each, Rfl: 5    OPTICHAMBER DIAMOND [facility] Spcr, , Disp: , Rfl:     pantoprazole (PROTONIX) 40 MG tablet, Take 1 tablet (40 mg total) by mouth daily., Disp: 90 tablet, Rfl: 0    polyethylene glycol (MIRALAX) 17 gram packet, Take 17 g (1 packet in 4-8 oz of liquid)  by mouth daily., Disp: 90 packet, Rfl: 3    QUEtiapine (SEROQUEL) 100 MG tablet, Take 1 tablet (100 mg total) by mouth nightly., Disp: , Rfl:     Allergies  Morphine and Toradol [ketorolac]    Family History  Family History   Problem Relation Age of Onset    Breast cancer Mother     Diabetes Maternal Aunt     Colon cancer Maternal Grandmother     Prostate cancer Maternal Grandfather     Cancer Paternal Grandmother     Crohn's disease Neg Hx        Social History  Social History     Tobacco Use    Smoking status: Every Day     Current packs/day: 0.30     Average packs/day: 0.5 packs/day for 22.0 years (10.4 ttl pk-yrs)     Types: Cigarettes     Start date: 10/02/2001     Passive exposure: Current    Smokeless tobacco: Never    Tobacco comments:     Reduced use from 10cpd to 5cpd, though relights each one. Motivated to quit.    Vaping Use    Vaping status: Some Days    Substances: Nicotine    Passive vaping exposure: Yes   Substance Use Topics    Alcohol use: No     Alcohol/week: 0.0 standard drinks of alcohol    Drug use: Yes     Types: Marijuana  Comment: Smokes 2-3 joints/day        Physical Exam     VITAL SIGNS:      Vitals:    10/18/23 0936 10/18/23 0945   BP:  117/84   Pulse: 107 65   Resp:  16   Temp:  37 ??C (98.6 ??F)   TempSrc:  Oral   SpO2: 98% 95%   Weight:  69.4 kg (153 lb)       Constitutional: Alert and oriented. No acute distress.  Eyes: Conjunctivae are normal.  HEENT: Normocephalic and atraumatic. Conjunctivae clear. No congestion. Dry mucous membranes.   Cardiovascular: Rate as above, regular rhythm. Normal and symmetric distal pulses. Normal skin turgor.  Respiratory: Normal respiratory effort. Breath sounds are normal. There are no wheezing or crackles heard.  Gastrointestinal: Diffuse abdominal tenderness worse in the RLQ with guarding.   Genitourinary: Deferred.  Musculoskeletal: Non-tender with normal range of motion in all extremities. No swelling or tenderness to bilateral lower extremities. Good femoral and DP/PT pulses bilaterally. No discoloration. However, patient does have delayed cap refill bilaterally  Neurologic: Normal speech and language. No gross focal neurologic deficits are appreciated. Patient is moving all extremities equally, face is symmetric at rest and with speech.  Skin: Small area of erythema with scattered papules to the radial aspect of the L wrist. Some excoriations over this area.   Psychiatric: Mood and affect are normal. Speech and behavior are normal.     Radiology     No orders to display       Pertinent labs & imaging results that were available during my care of the patient were independently interpreted by me and considered in my medical decision making (see chart for details).    Portions of this record have been created using Scientist, clinical (histocompatibility and immunogenetics). Dictation errors have been sought, but may not have been identified and corrected.    Documentation assistance was provided by Enzo Bi, Scribe on October 18, 2023 at 10:58 AM for Rennis Chris, MD.    October 18, 2023 5:22 PM. Documentation assistance provided by the scribe. I was present during the time the encounter was recorded. The information recorded by the scribe was done at my direction and has been reviewed and validated by me.               Rennis Chris, MD  Resident  10/18/23 [phone]

## 2023-10-19 MED ADMIN — prochlorperazine (COMPAZINE) injection 5 mg: 5 mg | INTRAVENOUS | Stop: 2023-10-18

## 2023-10-19 MED ADMIN — oxyCODONE (ROXICODONE) immediate release tablet 5 mg: 5 mg | ORAL | @ 03:00:00 | Stop: 2023-10-18

## 2023-10-19 MED ADMIN — diphenhydrAMINE (BENADRYL) capsule/tablet 50 mg: 50 mg | ORAL | @ 03:00:00

## 2023-10-19 MED ADMIN — iohexol (OMNIPAQUE) 350 mg iodine/mL solution 75 mL: 75 mL | INTRAVENOUS | @ 02:00:00 | Stop: 2023-10-18

## 2023-10-20 NOTE — Unmapped (Signed)
 ntains abnormal data Urine Culture  Order: [id number] - Reflex for Order [id number]   Status: Final result    Test Result Released: No    Specimen Information: Clean Catch; Urine  View Coordination for this Result      View Follow-Up Encounter  Urine Culture, Comprehensive   10,000 to 50,000 CFU/mL Proteus mirabilis Abnormal   Susceptibility Testing By Consultation Only    Specimen Source: Clean Catch      Resulting Agency: [facility]          Due to documented no answer X 3, certified letter submitted and to be sent to pt.

## 2023-10-23 ENCOUNTER — Ambulatory Visit: Admit: 2023-10-23 | Payer: PRIVATE HEALTH INSURANCE

## 2023-10-23 NOTE — Unmapped (Signed)
 [facility] Refill Coordination Note    Specialty Medication(s) to be Shipped:   Inflammatory Disorders: Humira    Other medication(s) to be shipped:  sharps container     Jerome Kennedy., DOB: 02/09/1976  Phone: There are no phone numbers on file.      All above HIPAA information was verified with patient.     Was a Nurse, learning disability used for this call? No    Completed refill call assessment today to schedule patient's medication shipment from the [facility]  [phone]).  All relevant notes have been reviewed.     Specialty medication(s) and dose(s) confirmed: Regimen is correct and unchanged.   Changes to medications: Brien reports no changes at this time.  Changes to insurance: No  New side effects reported not previously addressed with a pharmacist or physician: None reported  Questions for the pharmacist: No    Confirmed patient received a Conservation officer, historic buildings and a Surveyor, mining with first shipment. The patient will receive a drug information handout for each medication shipped and additional FDA Medication Guides as required.       DISEASE/MEDICATION-SPECIFIC INFORMATION        For patients on injectable medications: Patient currently has 1 doses left.  Next injection is scheduled for 3/23.    SPECIALTY MEDICATION ADHERENCE     Medication Adherence    Patient reported X missed doses in the last month: 0  Specialty Medication: HUMIRA(CF) PEN 40 mg/0.4 mL  Patient is on additional specialty medications: No              Were doses missed due to medication being on hold? No    Humira 40/0.4 mg/ml: 1 doses of medicine on hand        REFERRAL TO PHARMACIST     Referral to the pharmacist: Not needed      [facility]     Shipping address confirmed in Epic.     Cost and Payment:  The pt has a $4 copay, unable to make the payment    Delivery Scheduled: Yes, Expected medication delivery date: 10/27/23.     Medication will be delivered via Same Day Courier to the prescription address in Epic [place].    [name]   [facility]  Specialty Technician

## 2023-10-27 MED FILL — EMPTY CONTAINER: 120 days supply | Qty: 1 | Fill #3

## 2023-10-27 MED FILL — HUMIRA PEN CITRATE FREE 40 MG/0.4 ML: SUBCUTANEOUS | 28 days supply | Qty: 4 | Fill #4

## 2023-10-31 ENCOUNTER — Emergency Department
Admit: 2023-10-31 | Discharge: 2023-10-31 | Disposition: A | Discharge status: Home / Self Care | Attending: Student in an Organized Health Care Education/Training Program

## 2023-10-31 DIAGNOSIS — R109 Unspecified abdominal pain: Principal | ICD-10-CM

## 2023-10-31 DIAGNOSIS — R112 Nausea with vomiting, unspecified: Principal | ICD-10-CM

## 2023-10-31 LAB — SEDIMENTATION RATE: ERYTHROCYTE SEDIMENTATION RATE: 13 mm/h (ref 0–15)

## 2023-10-31 LAB — COMPREHENSIVE METABOLIC PANEL
ALBUMIN: 3.1 g/dL — ABNORMAL LOW (ref 3.4–5.0)
ALKALINE PHOSPHATASE: 155 U/L — ABNORMAL HIGH (ref 46–116)
ALT (SGPT): 12 U/L (ref 10–49)
ANION GAP: 8 mmol/L (ref 5–14)
AST (SGOT): 18 U/L (ref <=34)
BILIRUBIN TOTAL: 0.5 mg/dL (ref 0.3–1.2)
BLOOD UREA NITROGEN: 9 mg/dL (ref 9–23)
BUN / CREAT RATIO: 8
CALCIUM: 8.8 mg/dL (ref 8.7–10.4)
CHLORIDE: 110 mmol/L — ABNORMAL HIGH (ref 98–107)
CO2: 26 mmol/L (ref 20.0–31.0)
CREATININE: 1.06 mg/dL (ref 0.73–1.18)
EGFR CKD-EPI (2021) MALE: 87 mL/min/1.73m2 (ref >=60)
GLUCOSE RANDOM: 83 mg/dL (ref 70–179)
POTASSIUM: 4.4 mmol/L (ref 3.4–4.8)
PROTEIN TOTAL: 6.2 g/dL (ref 5.7–8.2)
SODIUM: 144 mmol/L (ref 135–145)

## 2023-10-31 LAB — CBC W/ AUTO DIFF
BASOPHILS ABSOLUTE COUNT: 0 10*9/L (ref 0.0–0.1)
BASOPHILS RELATIVE PERCENT: 0.6 %
EOSINOPHILS ABSOLUTE COUNT: 0.1 10*9/L (ref 0.0–0.5)
EOSINOPHILS RELATIVE PERCENT: 2.5 %
HEMATOCRIT: 39 % (ref 39.0–48.0)
HEMOGLOBIN: 12.8 g/dL — ABNORMAL LOW (ref 12.9–16.5)
LYMPHOCYTES ABSOLUTE COUNT: 2.1 10*9/L (ref 1.1–3.6)
LYMPHOCYTES RELATIVE PERCENT: 37.2 %
MEAN CORPUSCULAR HEMOGLOBIN CONC: 32.8 g/dL (ref 32.0–36.0)
MEAN CORPUSCULAR HEMOGLOBIN: 27.1 pg (ref 25.9–32.4)
MEAN CORPUSCULAR VOLUME: 82.6 fL (ref 77.6–95.7)
MEAN PLATELET VOLUME: 8 fL (ref 6.8–10.7)
MONOCYTES ABSOLUTE COUNT: 0.4 10*9/L (ref 0.3–0.8)
MONOCYTES RELATIVE PERCENT: 7.6 %
NEUTROPHILS ABSOLUTE COUNT: 2.9 10*9/L (ref 1.8–7.8)
NEUTROPHILS RELATIVE PERCENT: 52.1 %
PLATELET COUNT: 197 10*9/L (ref 150–450)
RED BLOOD CELL COUNT: 4.72 10*12/L (ref 4.26–5.60)
RED CELL DISTRIBUTION WIDTH: 14.9 % (ref 12.2–15.2)
WBC ADJUSTED: 5.6 10*9/L (ref 3.6–11.2)

## 2023-10-31 LAB — MAGNESIUM: MAGNESIUM: 1.8 mg/dL (ref 1.6–2.6)

## 2023-10-31 LAB — C-REACTIVE PROTEIN: C-REACTIVE PROTEIN: <5 mg/L (ref <=10.0)

## 2023-10-31 LAB — LIPASE: LIPASE: 32 U/L (ref 12–53)

## 2023-10-31 MED ORDER — DICYCLOMINE 20 MG TABLET
ORAL_TABLET | Freq: Two times a day (BID) | ORAL | 0 refills | 10 days | Status: CP
Start: 2023-10-31 — End: 2023-11-10

## 2023-10-31 MED ORDER — ONDANSETRON 4 MG DISINTEGRATING TABLET
ORAL_TABLET | Freq: Three times a day (TID) | ORAL | 0 refills | 5 days | Status: CP | PRN
Start: 2023-10-31 — End: 2023-11-07

## 2023-10-31 MED ADMIN — ondansetron (ZOFRAN) injection 4 mg: 4 mg | INTRAVENOUS | @ 14:00:00 | Stop: 2023-10-31

## 2023-10-31 MED ADMIN — oxyCODONE (ROXICODONE) immediate release tablet 5 mg: 5 mg | ORAL | @ 14:00:00 | Stop: 2023-10-31

## 2023-10-31 NOTE — Unmapped (Signed)
 BIB [facility] for abdominal pain, n/v. Hx Chron's. EMS gave IN fentanyl and 4mg  zofran

## 2023-10-31 NOTE — Unmapped (Signed)
 [facility]  Emergency Department Provider Note     ED Clinical Impression     Final diagnoses:   Abdominal pain, unspecified abdominal location (Primary)   Nausea and vomiting, unspecified vomiting type      HPI, Medical Decision Making, ED Course     HPI: 48 y.o. male who has a past medical history of Crohn's disease of the small and large intestine diagnosed in 54s status post ileocecectomy in 2003 complicated by SBO secondary to stricturing disease now status post right hemicolectomy and end ileostomy who presents with abdominal pain.  Patient reports that this morning he has been having abdominal pain.  He states is similar to when he was here 2 weeks ago.  Also had some nonbloody, nonbilious emesis as well as some watery nonbloody stools.  Denies any fever, chest pain, urinary symptoms, shortness of breath.      Vitals here are generally reassuring.  Upon initial evaluation, patient in no acute distress.  Lungs are clear to auscultation bilaterally.  Abdomen soft, nondistended, with mild generalized tenderness to palpation without rebound or guarding.  Does have a reducible umbilical hernia.    DDx/MDM: Initial differential includes but is not limited to Crohn's flareup, functional abdominal pain, viral illness, cannabinoid hyperemesis syndrome, malingering, electrolyte abnormality, amongst multiple other etiologies.  Will plan for CBC, CMP, lipase, magnesium as well as inflammatory markers.  Will study EKG given his vomiting to evaluate for intervals and any arrhythmias.  Low suspicion for atypical ACS.  Will do viral swabs.  Denies any urinary symptoms so do not suspect UTI.  Per chart review, patient has been seen here multiple times for similar symptoms and typically leaves after getting pain meds and prior to getting a CT scan.  Prior CT scans have shown some inflammation and bowel wall thickening of the neoterminal ileum.  Patient does not have a peritonitic abdomen and overall looks well-appearing with reassuring vitals, at this time, we will hold off on CT imaging until further workup.  He does not appear septic at this time.    Patient took Tylenol at home and received fentanyl and Zofran with EMS.  Pain has improved.      ED Course  ED Course as of 10/31/23 1020   Sat Oct 31, 2023   0811 EKG independently reviewed myself and shows normal sinus rhythm with rate of 82 bpm.  QTc of 418.  No significant ST elevations or depressions.  No acute STEMI or arrhythmia.   0924 CBC with normal WBC, hemoglobin 12.8, normal platelets.   4696 Magnesium: 1.8   0936 Lipase: 32   0936 CMP with reassuring electrolytes, renal function, bilirubin and LFTs.   2952 Patient having some abdominal pain and nausea at this time so will give another dose of zofran and oxycodone.    1004 Sed Rate: 13   1012 CRP: <5.0   1012 Inflammatory markers as well as white count are negative, therefore lower suspicion for acute infectious or significant inflammatory processes at this time.  Anticipate this is likely an exacerbation of patient's chronic abdominal pain.  Therefore, do not feel like we need a CT scan.   1019 Negative COVID-19, influenza, RSV.  On reexamination, patient states he feels better.  He would like a sandwich.  I discussed with patient plan to discharge with Zofran and Bentyl.  Gave appropriate return precautions.  Recommended PCP and GI follow-up.  He understands and is agreeable to the plan.  Independent Interpretation of Studies: If applicable, documented in ED course above.  I have reviewed recent and relevant previous record, including: Inpatient notes - discharge summary from 07/01/2023 for abdominal pain  Escalation of Care including OBS/Admission/Transfer was considered: However, patient was determined to be appropriate for outpatient management.      Diagnostic tests considered but not performed: Considered CT abdomen and pelvis however patient has been seen here multiple times for the same, has reassuring exam and imaging so low suspicion for acute infection or inflammatory changes    Additional History Elements     Chief Complaint  Chief Complaint   Patient presents with    Abdominal Pain     Additional Historian(s): EMS    Past Medical History:   Diagnosis Date    Anemia 04/05/2022    Anxiety     Avascular necrosis of femur head, right 2011    Bipolar disorder 2013    Follows the Psychiatry    Cannabis use disorder     COPD (chronic obstructive pulmonary disease) 2022    Crohn's disease 1990    Depression 2007    Severe depressive episode with psychotic symptoms    DVT (deep venous thrombosis) 02/08/2010    DVT of superior vena cava, left brachiocephalic, 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    Myalgia     Rhinovirus infection 10/29/2022    SBO (small bowel obstruction) 2011       Past Surgical History:   Procedure Laterality Date    CHOLECYSTECTOMY      COLON SURGERY      partial resection    HERNIA REPAIR      PR ARTHRP INTERCARPAL/CARP/MTCRPL JT INTERPOSITION Left 08/07/2022    Procedure: INTERPOSIT ARTHROPLASTY-INTERCARPAL/CARPOMETACAR;  Surgeon: [name], MD;  Location: MAIN OR [facility];  Service: Orthopedics    PR CLOSE ENTEROSTOMY,RESEC+COLOREC ANAS Midline 11/01/2019    Procedure: CLO ENTEROSTOMY; W/RESECT Daleen Snook;  Surgeon: Claretta Fraise, MD;  Location: MAIN OR [place];  Service: Gastrointestinal    PR COLONOSCOPY FLX DX W/COLLJ SPEC WHEN PFRMD Left 01/07/2013    Procedure: COLONOSCOPY, FLEXIBLE, PROXIMAL TO SPLENIC FLEXURE; DIAGNOSTIC, W/WO COLLECTION SPECIMEN BY BRUSH OR WASH;  Surgeon: Tish Men, MD;  Location: GI PROCEDURES MEMORIAL [facility];  Service: Gastroenterology    PR COLONOSCOPY FLX DX W/COLLJ SPEC WHEN PFRMD  03/09/2015    Procedure: COLONOSCOPY, FLEXIBLE, PROXIMAL TO SPLENIC FLEXURE; DIAGNOSTIC, W/WO COLLECTION SPECIMEN BY BRUSH OR WASH;  Surgeon: Cletis Athens, MD;  Location: GI PROCEDURES MEMORIAL [facility];  Service: Gastroenterology    PR COLONOSCOPY FLX DX W/COLLJ SPEC WHEN PFRMD N/A 10/28/2019    Procedure: COLONOSCOPY, FLEXIBLE, PROXIMAL TO SPLENIC FLEXURE; DIAGNOSTIC, W/WO COLLECTION SPECIMEN BY BRUSH OR WASH;  Surgeon: Beverly Milch, MD;  Location: GI PROCEDURES MEMORIAL [facility];  Service: Gastroenterology    PR COLONOSCOPY FLX DX W/COLLJ SPEC WHEN PFRMD N/A 10/01/2022    Procedure: COLONOSCOPY, FLEXIBLE, PROXIMAL TO SPLENIC FLEXURE; DIAGNOSTIC, W/WO COLLECTION SPECIMEN BY BRUSH OR WASH;  Surgeon: Zetta Bills, MD;  Location: GI PROCEDURES MEMORIAL [facility];  Service: Gastroenterology    PR EXPLORATORY OF ABDOMEN Midline 09/02/2019    Procedure: EXPLORATORY LAPAROTOMY, EXPLORATORY CELIOTOMY WITH OR WITHOUT BIOPSY(S);  Surgeon: Claretta Fraise, MD;  Location: MAIN OR [facility];  Service: Gastrointestinal    PR EXPLORATORY OF ABDOMEN Midline 11/01/2019    Procedure: EXPLORATORY LAPAROTOMY, EXPLORATORY CELIOTOMY WITH OR WITHOUT BIOPSY(S);  Surgeon: Claretta Fraise, MD;  Location: MAIN OR [place];  Service: Gastrointestinal    PR FIX FINGER,VOLAR PLATE,I-P  JT Left 08/07/2022    Procedure: REPAIR AND RECONSTRUCTION, FINGER, VOLAR PLATE, INTERPHALANGEAL JOINT;  Surgeon: [name], MD;  Location: MAIN OR [facility];  Service: Orthopedics    PR FREEING BOWEL ADHESION,ENTEROLYSIS N/A 11/01/2019    Procedure: Enterolysis (Separt Proc);  Surgeon: Claretta Fraise, MD;  Location: MAIN OR [facility];  Service: Gastrointestinal    PR FUSION MC-P JT Left 08/27/2023    Procedure: ARTHRODESIS, METACARPOPHALANGEAL JOINT, WITH OR WITHOUT INTERNAL FIXATION;  Surgeon: [name], MD;  Location: OR [facility];  Service: Orthopedics    PR ILEOSCOPY THRU STOMA,BIOPSY N/A 10/14/2019    Procedure: Karren Cobble; [place] Saa 1/MX;  Surgeon: Neysa Hotter, MD;  Location: GI PROCEDURES [facility]ORIAL [facility];  Service: Gastroenterology    PR PART REMOVAL COLON W COLOSTOMY Midline 09/02/2019    Procedure: COLECTOMY, PARTIAL; WITH SKIN LEVEL CECOSTOMY OR COLOSTOMY;  Surgeon: Claretta Fraise, MD;  Location: MAIN OR [facility];  Service: Gastrointestinal    PR REPAIR INCISIONAL HERNIA,REDUCIBLE Midline 09/02/2019    Procedure: REPAIR INIT INCISIONAL OR VENTRAL HERNIA; REDUCIBLE;  Surgeon: Kristopher Oppenheim, MD;  Location: MAIN OR [place];  Service: Trauma    PR UPPER GI ENDOSCOPY,BIOPSY N/A 01/07/2013    Procedure: UGI ENDOSCOPY; WITH BIOPSY, SINGLE OR MULTIPLE;  Surgeon: Tish Men, MD;  Location: GI PROCEDURES [facility]OR[facility];  Service: Gastroenterology    PR UPPER GI ENDOSCOPY,BIOPSY N/A 10/14/2019    Procedure: UGI ENDOSCOPY; WITH BIOPSY, SINGLE OR MULTIPLE;  Surgeon: Neysa Hotter, MD;  Location: GI PROCEDURES [facility]ORIAL [facility];  Service: Gastroenterology    PR UPPER GI ENDOSCOPY,DIAGNOSIS N/A 10/01/2022    Procedure: UGI ENDO, INCLUDE ESOPHAGUS, STOMACH, & DUODENUM &/OR JEJUNUM; DX W/WO COLLECTION SPECIMN, BY BRUSH OR WASH;  Surgeon: Zetta Bills, MD;  Location: GI PROCEDURES MEMORIAL [facility];  Service: Gastroenterology       Allergies  Morphine and Toradol [ketorolac]    Family History  Family History   Problem Relation Age of Onset    Breast cancer Mother     Diabetes Maternal Aunt     Colon cancer Maternal Grandmother     Prostate cancer Maternal Grandfather     Cancer Paternal Grandmother     Crohn's disease Neg Hx        Social History  Social History     Tobacco Use    Smoking status: Every Day     Current packs/day: 0.30     Average packs/day: 0.5 packs/day for 22.1 years (10.4 ttl pk-yrs)     Types: Cigarettes     Start date: 10/02/2001     Passive exposure: Current    Smokeless tobacco: Never    Tobacco comments:     Reduced use from 10cpd to 5cpd, though relights each one. Motivated to quit.    Vaping Use    Vaping status: Some Days    Substances: Nicotine    Passive vaping exposure: Yes   Substance Use Topics    Alcohol use: No     Alcohol/week: 0.0 standard drinks of alcohol    Drug use: Yes     Types: Marijuana     Comment: Smokes 2-3 joints/day        Physical Exam     VITAL SIGNS:      Vitals:    10/31/23 0728   BP: 102/75   Pulse: 91   Resp: 16   Temp: 36.6 ??C (97.8 ??F)   TempSrc: Oral   SpO2: 100%   Weight: 76.9 kg (169 lb  8 oz)     Constitutional: Alert and oriented. No acute distress.  Eyes: Conjunctivae are normal.  HEENT: Normocephalic and atraumatic. No congestion. Moist mucous membranes.   Cardiovascular: Rate as above, regular rhythm. Normal and symmetric distal pulses.  Respiratory: Normal respiratory effort. Breath sounds are normal. There are no wheezing or crackles heard.  Gastrointestinal: .  Abdomen soft, nondistended, with mild generalized tenderness to palpation without rebound or guarding.  Does have a reducible umbilical hernia.  Genitourinary: Deferred.  Musculoskeletal: Non-tender with normal range of motion in all extremities.  Neurologic: Normal speech and language. No gross focal neurologic deficits are appreciated. Patient is moving all extremities equally, face is symmetric at rest and with speech.  Skin: Skin is warm, dry and intact. No rash noted.  Psychiatric: Mood and affect are normal. Speech and behavior are normal.     Radiology     No orders to display       Pertinent labs & imaging results that were available during my care of the patient were independently interpreted by me and considered in my medical decision making (see chart for details).    Portions of this record have been created using Scientist, clinical (histocompatibility and immunogenetics). Dictation errors have been sought, but may not have been identified and corrected.         Francis Dowse, MD  Resident  10/31/23 [phone]

## 2023-10-31 NOTE — Unmapped (Signed)
 BIB EMS with complaints of RLQ abdominal pain. Hx of Chron's. States this feels like a Chron's flare. Pain 5/10. 100 mcg intranasal Fentanyl given by EMS and PO zofran 4 mg. VSS

## 2023-11-15 ENCOUNTER — Inpatient Hospital Stay: Admit: 2023-11-15 | Discharge: 2023-11-16 | Discharge status: Against Medical Advice | Payer: Medicaid (Managed Care)

## 2023-11-15 ENCOUNTER — Ambulatory Visit: Admit: 2023-11-15 | Discharge: 2023-11-16 | Discharge status: Against Medical Advice | Payer: Medicaid (Managed Care)

## 2023-11-15 LAB — URINALYSIS WITH MICROSCOPY WITH CULTURE REFLEX PERFORMABLE
BACTERIA: NONE SEEN /HPF
BILIRUBIN UA: NEGATIVE
BLOOD UA: NEGATIVE
GLUCOSE UA: NEGATIVE
KETONES UA: NEGATIVE
NITRITE UA: NEGATIVE
PH UA: 6.5 (ref 5.0–9.0)
RBC UA: 4 /HPF — ABNORMAL HIGH (ref <=3)
SPECIFIC GRAVITY UA: 1.035 — ABNORMAL HIGH (ref 1.003–1.030)
SQUAMOUS EPITHELIAL: 4 /HPF (ref 0–5)
UROBILINOGEN UA: <2
WBC UA: 11 /HPF — ABNORMAL HIGH (ref <=2)

## 2023-11-15 LAB — CBC W/ AUTO DIFF
BASOPHILS ABSOLUTE COUNT: 0.1 10*9/L (ref 0.0–0.1)
BASOPHILS RELATIVE PERCENT: 0.9 %
EOSINOPHILS ABSOLUTE COUNT: 0.1 10*9/L (ref 0.0–0.5)
EOSINOPHILS RELATIVE PERCENT: 2.2 %
HEMATOCRIT: 38.7 % — ABNORMAL LOW (ref 39.0–48.0)
HEMOGLOBIN: 12.5 g/dL — ABNORMAL LOW (ref 12.9–16.5)
LYMPHOCYTES ABSOLUTE COUNT: 2 10*9/L (ref 1.1–3.6)
LYMPHOCYTES RELATIVE PERCENT: 35.6 %
MEAN CORPUSCULAR HEMOGLOBIN CONC: 32.4 g/dL (ref 32.0–36.0)
MEAN CORPUSCULAR HEMOGLOBIN: 26.7 pg (ref 25.9–32.4)
MEAN CORPUSCULAR VOLUME: 82.3 fL (ref 77.6–95.7)
MEAN PLATELET VOLUME: 8 fL (ref 6.8–10.7)
MONOCYTES ABSOLUTE COUNT: 0.3 10*9/L (ref 0.3–0.8)
MONOCYTES RELATIVE PERCENT: 5.4 %
NEUTROPHILS ABSOLUTE COUNT: 3.2 10*9/L (ref 1.8–7.8)
NEUTROPHILS RELATIVE PERCENT: 55.9 %
PLATELET COUNT: 214 10*9/L (ref 150–450)
RED BLOOD CELL COUNT: 4.7 10*12/L (ref 4.26–5.60)
RED CELL DISTRIBUTION WIDTH: 15 % (ref 12.2–15.2)
WBC ADJUSTED: 5.7 10*9/L (ref 3.6–11.2)

## 2023-11-15 LAB — COMPREHENSIVE METABOLIC PANEL
ALBUMIN: 2.9 g/dL — ABNORMAL LOW (ref 3.4–5.0)
ALKALINE PHOSPHATASE: 164 U/L — ABNORMAL HIGH (ref 46–116)
ALT (SGPT): 33 U/L (ref 10–49)
ANION GAP: 9 mmol/L (ref 5–14)
AST (SGOT): 28 U/L (ref <=34)
BILIRUBIN TOTAL: 0.3 mg/dL (ref 0.3–1.2)
BLOOD UREA NITROGEN: 7 mg/dL — ABNORMAL LOW (ref 9–23)
BUN / CREAT RATIO: 7
CALCIUM: 8.3 mg/dL — ABNORMAL LOW (ref 8.7–10.4)
CHLORIDE: 115 mmol/L — ABNORMAL HIGH (ref 98–107)
CO2: 23 mmol/L (ref 20.0–31.0)
CREATININE: 1.05 mg/dL (ref 0.73–1.18)
EGFR CKD-EPI (2021) MALE: 88 mL/min/1.73m2 (ref >=60)
GLUCOSE RANDOM: 85 mg/dL (ref 70–179)
POTASSIUM: 3.6 mmol/L (ref 3.4–4.8)
PROTEIN TOTAL: 5.9 g/dL (ref 5.7–8.2)
SODIUM: 147 mmol/L — ABNORMAL HIGH (ref 135–145)

## 2023-11-15 LAB — SEDIMENTATION RATE: ERYTHROCYTE SEDIMENTATION RATE: 6 mm/h (ref 0–15)

## 2023-11-15 LAB — C-REACTIVE PROTEIN: C-REACTIVE PROTEIN: <5 mg/L (ref <=10.0)

## 2023-11-15 MED ADMIN — ondansetron (ZOFRAN) injection 4 mg: 4 mg | INTRAVENOUS | @ 21:00:00 | Stop: 2023-11-15

## 2023-11-15 MED ADMIN — HYDROmorphone (PF) (DILAUDID) injection Syrg 0.5 mg: 0.5 mg | INTRAVENOUS | @ 23:00:00 | Stop: 2023-11-15

## 2023-11-15 MED ADMIN — metoclopramide (REGLAN) injection 10 mg: 10 mg | INTRAVENOUS | @ 22:00:00 | Stop: 2023-11-15

## 2023-11-15 MED ADMIN — oxyCODONE (ROXICODONE) immediate release tablet 5 mg: 5 mg | ORAL | @ 23:00:00 | Stop: 2023-11-15

## 2023-11-15 MED ADMIN — HYDROmorphone (PF) (DILAUDID) injection 1 mg: 1 mg | INTRAVENOUS | @ 22:00:00 | Stop: 2023-11-15

## 2023-11-15 MED ADMIN — fentaNYL (PF) (SUBLIMAZE) injection 50 mcg: 50 ug | INTRAVENOUS | @ 21:00:00 | Stop: 2023-11-15

## 2023-11-15 MED ADMIN — iohexol (OMNIPAQUE) 350 mg iodine/mL solution 100 mL: 100 mL | INTRAVENOUS | @ 22:00:00 | Stop: 2023-11-15

## 2023-11-15 NOTE — Unmapped (Signed)
 Bed: 49-C  Expected date:   Expected time:   Means of arrival:   Comments:

## 2023-11-15 NOTE — Unmapped (Signed)
 [facility] Medicine   History and Physical       Assessment and Plan     Jerome Halbert. is a 48 y.o. male who is presenting to [facility] with Exacerbation of Crohn's disease, in the setting of the following pertinent/contributing co-morbidities:       Exacerbation of Crohn's disease   Illness that poses a threat to life or bodily function without appropriate treatment        Crohns exacerbation versus functional abdominal pain  Presenting for worsening RLQ abdominal pain, hx of small and large bowel involvement s/p ileus complicated by SBO due to sutures, s/p right hemicolectomy with end ileostomy.  Unable to tolerate PO, CT imaging demonstrating focal wall thickening in duodenum, which has previously been attributed to chronic inflammation per previous GI notes, inflammatory markers are non-elevated at this time. Has had previous admission with similar presentation and been deemed to be due to chronic functional abdominal pain with pending outpatient colonoscopy.  Missed previous appointment due to inability to arrange transport post procedure.   -GI consult in the AM for colonoscopy evaluation  -Diff assay, GI PCR,  -dilaudid -oxycodone -tylenol  pain scale  -LR 100 cc/hr  -phenergan  5 mg IV q8h prn for n/v  -benadryl  25 mg PO q6h prn for opiod induced pruritis    Hypernatremia  147 on admission, elevated chloride consistent with history of persistent emesis, LR as above    Secondary/Additional Active Problems:  Normocytic Anemia  On admission 12.5, consistent with approximate baseline approximately 12-13, previous history of IDA, with possibility of active disease may have element of AOCD contributing  -trend cbc    Bipolar 1 disorder  -managed on quetipaine 100 mg at night, will continue as able to tolerate    COPD versus asthma?  -is pending outpatient PFTs, has been on inhaler therapy with symbicort  with improvement in respiratory status, uses albuterol  approximately 6 times a week for rescue dosing, denies any nighttime awakenings  -Wheezing appreciated on auscultation,denied any respiratory complaints  -duonebs q6h prn  -Symbicort  non-formulary, will step up breo-elipta equivalent while admitted    Tobacco use disorder  -Approximately 13 year pack history +, down to a few cigarettes a day, not on cessation therapy at this time  -Nicotine  replacement as needed           Prophylaxis  -lovenox     Diet  -Nutrition Therapy Clear Liquid    Code Status / HCDM   -Full Code,    -  HCDM (patient stated preference): [place], [place] - Mother - [phone]    Anticipated Medically Ready for Discharge: Anticipated in 2-4 Days    Significant Comorbid Conditions:         Issues Impacting Complexity of Management:              HPI      Jerome Osley. is a 48 y.o. male who is presenting to [facility] with Exacerbation of Crohn's disease.  Patient has a pertinent PMH of bipolar, IDA, COPD presenting with abdominal pain.  Patient notes that this pain feels like his Crohn's pain, describes it as worsening since yesterday, he has been unable to hold down any food or liquid.  He denies any fevers or chills at this time.  Patient has notably presented to emergency department multiple times, last GI outpatient office note describes pain as likely functional, CT imaging results of thickening more likely chronic rather than acute inflammation.  He has been pending a colonoscopy outpatient which he was unable  to make due inability to arrange post procedure transport.  Patient feels abdominal pain is more progressive, presented to the emergency department two weeks ago with similar complaints, was discharged with pain control and recommended GI follow up.  He reports medication adherence with adalimumab  and his other prescribed medications without dose misses.  He lives in an apartment alone and endorses transportation to make his medical appointments otherwise.  He denies any other complaints at the time of the interview.     ER course was notable for hydromorphone  1 mg IV x1, 0.5 mg IV x1, zofran  4 mg IV x1, reglan  10 mg IV x1, oxycodone  5 mg x1, and fentanyl  50 mcg IV x1.  CT A/P with contrast obtained demonstrated questionable focal wall thickening of the distal second and proximal third portions of duodenum.      Med Rec Confidence   I reviewed the Medication List. The current list is Accurate    Physical Exam   Temp:  [36.7 ??C (98.1 ??F)] 36.7 ??C (98.1 ??F)  Heart Rate:  [77-80] 80  Resp:  [18-19] 18  BP: (119-128)/(77-85) 119/85  SpO2:  [98 %-100 %] 98 %  Body mass index is 22.15 kg/m??.  Physical Exam  Constitutional:       General: He is not in acute distress.     Appearance: He is not ill-appearing or toxic-appearing.      Comments: Middle aged male lying in ED stretcher, calm and cooperative throughout interview   HENT:      Head: Normocephalic.      Right Ear: External ear normal.      Left Ear: External ear normal.      Nose: Nose normal.      Mouth/Throat:      Mouth: Mucous membranes are moist.   Eyes:      General: No scleral icterus.     Conjunctiva/sclera: Conjunctivae normal.   Cardiovascular:      Rate and Rhythm: Normal rate and regular rhythm.   Pulmonary:      Effort: Pulmonary effort is normal.      Breath sounds: Wheezing present. No rales.   Abdominal:      General: Bowel sounds are normal. There is no distension.      Tenderness: There is abdominal tenderness (RLQ). There is guarding (RLQ).   Musculoskeletal:      Cervical back: Neck supple.      Right lower leg: No edema.      Left lower leg: No edema.   Skin:     General: Skin is warm.   Neurological:      Mental Status: He is oriented to person, place, and time.   Psychiatric:         Mood and Affect: Mood normal.         Behavior: Behavior normal.

## 2023-11-15 NOTE — Unmapped (Addendum)
 BIB [facility] for RLQ abdominal pain that started around 1330 today. Patient reports this happens around once a month for the last 4 months. HX of chrons. X2 episodes of vomiting with streaks of blood. VSS in triage

## 2023-11-15 NOTE — Unmapped (Signed)
 ED ATTENDING PROGRESS NOTE  November 15, 2023 7:13 PM    I received this patient in signout from Dr. Victory Gravel at the start of my shift.   Jerome Kennedy. is a 48 y.o. male who presented with Abdominal Pain      At the time of transfer of care, further workup and plan pending: CT abdomen    This is a patient with a history of Crohn's disease involving both small and large bowel status post ileus kicked me complicated by prior SBO secondary to structures, currently status post right heavy colectomy and end ileostomy presenting for abdominal pain. Has had several episodes of vomiting. Pain began acutely around 1330.       CT Abdomen Pelvis W IV Contrast   Final Result   --Questionable focal wall thickening of the distal second and proximal third portions of the duodenum may reflect an infectious or inflammatory enteritis. Otherwise, no acute findings in the abdomen or pelvis.               Plan:   Given severity of symptoms, difficulty tolerating oral intake, will require admission for symptom control and possible G.I. consultation.  ED Course as of 11/15/23 2007   Sun Nov 15, 2023   2007 Spoke to hospitalist and he will admit for further workup and care.           ED Clinical Impression     Final diagnoses:   Crohn's disease of small intestine without complication (Primary)

## 2023-11-15 NOTE — Unmapped (Signed)
 BIB [facility] for abdominal pain, n/v. 117/74, HR 77, RR 18, 98% ra

## 2023-11-16 LAB — CBC
HEMATOCRIT: 39 % (ref 39.0–48.0)
HEMOGLOBIN: 12.8 g/dL — ABNORMAL LOW (ref 12.9–16.5)
MEAN CORPUSCULAR HEMOGLOBIN CONC: 32.8 g/dL (ref 32.0–36.0)
MEAN CORPUSCULAR HEMOGLOBIN: 27.3 pg (ref 25.9–32.4)
MEAN CORPUSCULAR VOLUME: 83.2 fL (ref 77.6–95.7)
MEAN PLATELET VOLUME: 7.9 fL (ref 6.8–10.7)
PLATELET COUNT: 187 10*9/L (ref 150–450)
RED BLOOD CELL COUNT: 4.7 10*12/L (ref 4.26–5.60)
RED CELL DISTRIBUTION WIDTH: 14.7 % (ref 12.2–15.2)
WBC ADJUSTED: 5 10*9/L (ref 3.6–11.2)

## 2023-11-16 LAB — COMPREHENSIVE METABOLIC PANEL
ALBUMIN: 2.9 g/dL — ABNORMAL LOW (ref 3.4–5.0)
ALKALINE PHOSPHATASE: 160 U/L — ABNORMAL HIGH (ref 46–116)
ALT (SGPT): 31 U/L (ref 10–49)
ANION GAP: 11 mmol/L (ref 5–14)
AST (SGOT): 25 U/L (ref <=34)
BILIRUBIN TOTAL: 0.5 mg/dL (ref 0.3–1.2)
BLOOD UREA NITROGEN: 7 mg/dL — ABNORMAL LOW (ref 9–23)
BUN / CREAT RATIO: 7
CALCIUM: 7.9 mg/dL — ABNORMAL LOW (ref 8.7–10.4)
CHLORIDE: 113 mmol/L — ABNORMAL HIGH (ref 98–107)
CO2: 19 mmol/L — ABNORMAL LOW (ref 20.0–31.0)
CREATININE: 1.03 mg/dL (ref 0.73–1.18)
EGFR CKD-EPI (2021) MALE: 90 mL/min/1.73m2 (ref >=60)
GLUCOSE RANDOM: 80 mg/dL (ref 70–179)
POTASSIUM: 3.6 mmol/L (ref 3.4–4.8)
PROTEIN TOTAL: 5.7 g/dL (ref 5.7–8.2)
SODIUM: 143 mmol/L (ref 135–145)

## 2023-11-16 LAB — MAGNESIUM: MAGNESIUM: 1.3 mg/dL — ABNORMAL LOW (ref 1.6–2.6)

## 2023-11-16 LAB — PHOSPHORUS: PHOSPHORUS: 2.9 mg/dL (ref 2.4–5.1)

## 2023-11-16 LAB — IONIZED CALCIUM VENOUS: CALCIUM IONIZED VENOUS (MG/DL): 4.43 mg/dL (ref 4.40–5.40)

## 2023-11-16 MED ADMIN — acetaminophen (OFIRMEV) 10 mg/mL injection 1,000 mg: 1000 mg | INTRAVENOUS | @ 13:00:00 | Stop: 2023-11-16

## 2023-11-16 MED ADMIN — diphenhydrAMINE (BENADRYL) capsule/tablet 25 mg: 25 mg | ORAL | @ 01:00:00

## 2023-11-16 MED ADMIN — fluticasone furoate-vilanterol (BREO ELLIPTA) 200-25 mcg/dose inhaler 1 puff: 1 | RESPIRATORY_TRACT | @ 14:00:00 | Stop: 2023-11-16

## 2023-11-16 MED ADMIN — diphenhydrAMINE (BENADRYL) capsule/tablet 25 mg: 25 mg | ORAL | @ 09:00:00 | Stop: 2023-11-16

## 2023-11-16 MED ADMIN — HYDROmorphone (PF) (DILAUDID) injection 1 mg: 1 mg | INTRAVENOUS | @ 14:00:00 | Stop: 2023-11-16

## 2023-11-16 MED ADMIN — pantoprazole (Protonix) EC tablet 40 mg: 40 mg | ORAL | @ 14:00:00 | Stop: 2023-11-16

## 2023-11-16 MED ADMIN — HYDROmorphone (PF) (DILAUDID) injection 1 mg: 1 mg | INTRAVENOUS | @ 01:00:00 | Stop: 2023-11-29

## 2023-11-16 MED ADMIN — LORazepam (ATIVAN) tablet 0.5 mg: .5 mg | ORAL | @ 13:00:00 | Stop: 2023-11-16

## 2023-11-16 MED ADMIN — prochlorperazine (COMPAZINE) injection 5 mg: 5 mg | INTRAVENOUS | @ 01:00:00

## 2023-11-16 MED ADMIN — prochlorperazine (COMPAZINE) injection 5 mg: 5 mg | INTRAVENOUS | @ 10:00:00 | Stop: 2023-11-16

## 2023-11-16 MED ADMIN — montelukast (SINGULAIR) tablet 10 mg: 10 mg | ORAL | @ 03:00:00

## 2023-11-16 MED ADMIN — lactated ringers bolus 1,000 mL: 1000 mL | INTRAVENOUS | Stop: 2023-11-15

## 2023-11-16 MED ADMIN — fluticasone propionate (FLONASE) 50 mcg/actuation nasal spray 1 spray: 1 | NASAL | @ 14:00:00 | Stop: 2023-11-16

## 2023-11-16 MED ADMIN — promethazine (PHENERGAN) tablet 12.5 mg: 12.5 mg | ORAL | @ 01:00:00

## 2023-11-16 MED ADMIN — oxyCODONE (ROXICODONE) immediate release tablet 5 mg: 5 mg | ORAL | @ 05:00:00 | Stop: 2023-11-16

## 2023-11-16 MED ADMIN — enoxaparin (LOVENOX) syringe 40 mg: 40 mg | SUBCUTANEOUS | @ 14:00:00 | Stop: 2023-11-16

## 2023-11-16 MED ADMIN — dicyclomine (BENTYL) tablet 20 mg: 20 mg | ORAL | @ 03:00:00

## 2023-11-16 MED ADMIN — cholecalciferol (vitamin D3 25 mcg (1,000 units)) tablet 25 mcg: 25 ug | ORAL | @ 14:00:00 | Stop: 2023-11-16

## 2023-11-16 MED ADMIN — dicyclomine (BENTYL) tablet 20 mg: 20 mg | ORAL | @ 14:00:00 | Stop: 2023-11-16

## 2023-11-16 MED ADMIN — oxyCODONE (ROXICODONE) immediate release tablet 5 mg: 5 mg | ORAL | @ 12:00:00 | Stop: 2023-11-16

## 2023-11-16 MED ADMIN — lactated Ringers infusion: 100 mL/h | INTRAVENOUS | @ 01:00:00

## 2023-11-16 MED ADMIN — QUEtiapine (SEROQUEL) tablet 100 mg: 100 mg | ORAL | @ 03:00:00

## 2023-11-16 MED ADMIN — HYDROmorphone (PF) (DILAUDID) injection 1 mg: 1 mg | INTRAVENOUS | @ 09:00:00 | Stop: 2023-11-16

## 2023-11-16 MED ADMIN — promethazine (PHENERGAN) tablet 12.5 mg: 12.5 mg | ORAL | @ 09:00:00 | Stop: 2023-11-16

## 2023-11-16 NOTE — Unmapped (Signed)
 Physician Discharge Summary [facility]  4 ONC [facility]  [address]  [place] [address]  Dept: [phone]  Loc: [phone]     Identifying Information:   Jerome Kennedy  09-05-1975  [id number]    Primary Care Physician: Francois Isaacs, MD   Code Status: Full Code    Admit Date: 11/15/2023    Discharge Date: 11/16/2023     Discharge To: Against Medical Advice    Discharge Service: [facility] APP     Discharge Attending Physician: Ledell Prudent, ACNP    Discharge Diagnoses:  Principal Problem:    Exacerbation of Crohn's disease (POA: Yes)  Active Problems:    Crohn's disease (POA: Yes)    Anemia (POA: Yes)    Iron  deficiency anemia (POA: Yes)    Bipolar 1 disorder (POA: Yes)    COPD (chronic obstructive pulmonary disease) (POA: Yes)    Tobacco use disorder (POA: Yes)  Resolved Problems:    * No resolved hospital problems. [facility] Course:   Jerome Kennedy. is a 48 y.o. male with PMH of bipolar, IDA, COPD and s/p right hemicolectomy with end ileostomy who presented to [facility] with Exacerbation of Crohn's disease.    Patient noted abdominal pain which has been worsening since 4/12. Unable to tolerated food or liquid.  Denied any fevers or chills.  Notably had presented to emergency department multiple times. Last GI outpatient office note describes pain as likely functional. Had been pending a colonoscopy outpatient.  On admit reported medication adherence with adalimumab  and his other prescribed medications without dose misses. CT A/P demonstrated questionable focal wall thickening of the distal second and proximal third portions of duodenum. GI consulted for colonoscopy evaluation.       At approximately 10:15 RN notified me that patient had family emergency and will be leaving AMA.  By the time I got to patient's room for assessment at 10:30 AM he had already left the building.  Most recent vitals before AMA were stable.  Patient did sign AMA form before leaving to address family emergency.         Procedures:  No admission procedures for hospital encounter.    __________________________________________  Pending Test Results (if blank, then none):  Pending Labs       Order Current Status    Urine Culture In process            Most Recent Labs:  All lab results last 24 hours -   Recent Results (from the past 24 hours)   Comprehensive Metabolic Panel    Collection Time: 11/15/23  4:49 PM   Result Value Ref Range    Sodium 147 (H) 135 - 145 mmol/L    Potassium 3.6 3.4 - 4.8 mmol/L    Chloride 115 (H) 98 - 107 mmol/L    CO2 23.0 20.0 - 31.0 mmol/L    Anion Gap 9 5 - 14 mmol/L    BUN 7 (L) 9 - 23 mg/dL    Creatinine 8.41 3.24 - 1.18 mg/dL    BUN/Creatinine Ratio 7     eGFR CKD-EPI (2021) Male 88 >=60 mL/min/1.73m2    Glucose 85 70 - 179 mg/dL    Calcium  8.3 (L) 8.7 - 10.4 mg/dL    Albumin 2.9 (L) 3.4 - 5.0 g/dL    Total Protein 5.9 5.7 - 8.2 g/dL    Total Bilirubin 0.3 0.3 - 1.2 mg/dL    AST 28 <=40 U/L  ALT 33 10 - 49 U/L    Alkaline Phosphatase 164 (H) 46 - 116 U/L   Sedimentation rate, manual    Collection Time: 11/15/23  4:49 PM   Result Value Ref Range    Sed Rate 6 0 - 15 mm/h   C-reactive protein    Collection Time: 11/15/23  4:49 PM   Result Value Ref Range    CRP <5.0 <=10.0 mg/L   CBC w/ Differential    Collection Time: 11/15/23  4:49 PM   Result Value Ref Range    WBC 5.7 3.6 - 11.2 10*9/L    RBC 4.70 4.26 - 5.60 10*12/L    HGB 12.5 (L) 12.9 - 16.5 g/dL    HCT 34.7 (L) 42.5 - 48.0 %    MCV 82.3 77.6 - 95.7 fL    MCH 26.7 25.9 - 32.4 pg    MCHC 32.4 32.0 - 36.0 g/dL    RDW 95.6 38.7 - 56.4 %    MPV 8.0 6.8 - 10.7 fL    Platelet 214 150 - 450 10*9/L    Neutrophils % 55.9 %    Lymphocytes % 35.6 %    Monocytes % 5.4 %    Eosinophils % 2.2 %    Basophils % 0.9 %    Absolute Neutrophils 3.2 1.8 - 7.8 10*9/L    Absolute Lymphocytes 2.0 1.1 - 3.6 10*9/L    Absolute Monocytes 0.3 0.3 - 0.8 10*9/L    Absolute Eosinophils 0.1 0.0 - 0.5 10*9/L    Absolute Basophils 0.1 0.0 - 0.1 10*9/L Hypochromasia Slight (A) Not Present   Urinalysis with Microscopy with Culture Reflex    Collection Time: 11/15/23  6:06 PM   Result Value Ref Range    Color, UA Yellow     Clarity, UA Clear     Specific Gravity, UA 1.035 (H) 1.003 - 1.030    pH, UA 6.5 5.0 - 9.0    Leukocyte Esterase, UA Trace (A) Negative    Nitrite, UA Negative Negative    Protein, UA Trace (A) Negative    Glucose, UA Negative Negative    Ketones, UA Negative Negative    Urobilinogen, UA <2.0 mg/dL <3.3 mg/dL    Bilirubin, UA Negative Negative    Blood, UA Negative Negative    RBC, UA 4 (H) <=3 /HPF    WBC, UA 11 (H) <=2 /HPF    Squam Epithel, UA 4 0 - 5 /HPF    Bacteria, UA None Seen None Seen /HPF    Mucus, UA Rare (A) None Seen /HPF   Comprehensive metabolic panel    Collection Time: 11/16/23  8:08 AM   Result Value Ref Range    Sodium 143 135 - 145 mmol/L    Potassium 3.6 3.4 - 4.8 mmol/L    Chloride 113 (H) 98 - 107 mmol/L    CO2 19.0 (L) 20.0 - 31.0 mmol/L    Anion Gap 11 5 - 14 mmol/L    BUN 7 (L) 9 - 23 mg/dL    Creatinine 2.95 1.88 - 1.18 mg/dL    BUN/Creatinine Ratio 7     eGFR CKD-EPI (2021) Male 90 >=60 mL/min/1.48m2    Glucose 80 70 - 179 mg/dL    Calcium  7.9 (L) 8.7 - 10.4 mg/dL    Albumin 2.9 (L) 3.4 - 5.0 g/dL    Total Protein 5.7 5.7 - 8.2 g/dL    Total Bilirubin 0.5 0.3 - 1.2 mg/dL    AST 25 <=  34 U/L    ALT 31 10 - 49 U/L    Alkaline Phosphatase 160 (H) 46 - 116 U/L   Magnesium  Level    Collection Time: 11/16/23  8:08 AM   Result Value Ref Range    Magnesium  1.3 (L) 1.6 - 2.6 mg/dL   CBC    Collection Time: 11/16/23  8:08 AM   Result Value Ref Range    WBC 5.0 3.6 - 11.2 10*9/L    RBC 4.70 4.26 - 5.60 10*12/L    HGB 12.8 (L) 12.9 - 16.5 g/dL    HCT 16.1 09.6 - 04.5 %    MCV 83.2 77.6 - 95.7 fL    MCH 27.3 25.9 - 32.4 pg    MCHC 32.8 32.0 - 36.0 g/dL    RDW 40.9 81.1 - 91.4 %    MPV 7.9 6.8 - 10.7 fL    Platelet 187 150 - 450 10*9/L   Phosphorus Level    Collection Time: 11/16/23  8:08 AM   Result Value Ref Range    Phosphorus 2.9 2.4 - 5.1 mg/dL   Ionized Calcium , Venous    Collection Time: 11/16/23  8:09 AM   Result Value Ref Range    Calcium , Ionized Venous 4.43 4.40 - 5.40 mg/dL     Microbiology -   Microbiology Results (last day)       Procedure Component Value Date/Time Date/Time    GI Pathogen Panel [[id number]]     Lab Status: No result Specimen: Stool      C. Difficile Assay [[id number]]     Lab Status: No result Specimen: Stool      Urine Culture [[id number]] Collected: 11/15/23 1806    Lab Status: In process Specimen: Urine from Clean Catch Updated: 11/15/23 1819          CBC - Results in Past 30 Days  Result Component Current Result Ref Range Previous Result Ref Range   HCT 39.0 (11/16/2023) 39.0 - 48.0 % 38.7 (L) (11/15/2023) 39.0 - 48.0 %   HGB 12.8 (L) (11/16/2023) 12.9 - 16.5 g/dL 01.0 (L) (2/72/5366) 44.0 - 16.5 g/dL   MCH 34.7 (11/26/9561) 25.9 - 32.4 pg 26.7 (11/15/2023) 25.9 - 32.4 pg   MCHC 32.8 (11/16/2023) 32.0 - 36.0 g/dL 87.5 (6/43/3295) 18.8 - 36.0 g/dL   MCV 41.6 (01/08/3015) 77.6 - 95.7 fL 82.3 (11/15/2023) 77.6 - 95.7 fL   MPV 7.9 (11/16/2023) 6.8 - 10.7 fL 8.0 (11/15/2023) 6.8 - 10.7 fL   Platelet 187 (11/16/2023) 150 - 450 10*9/L 214 (11/15/2023) 150 - 450 10*9/L   RBC 4.70 (11/16/2023) 4.26 - 5.60 10*12/L 4.70 (11/15/2023) 4.26 - 5.60 10*12/L   WBC 5.0 (11/16/2023) 3.6 - 11.2 10*9/L 5.7 (11/15/2023) 3.6 - 11.2 10*9/L     BMP - Results in Past 30 Days  Result Component Current Result Ref Range Previous Result Ref Range   BUN 7 (L) (11/16/2023) 9 - 23 mg/dL 7 (L) (0/05/9322) 9 - 23 mg/dL   Chloride 557 (H) (10/23/252) 98 - 107 mmol/L 115 (H) (11/15/2023) 98 - 107 mmol/L   CO2 19.0 (L) (11/16/2023) 20.0 - 31.0 mmol/L 23.0 (11/15/2023) 20.0 - 31.0 mmol/L   Creatinine 1.03 (11/16/2023) 0.73 - 1.18 mg/dL 2.70 (01/25/7627) 3.15 - 1.18 mg/dL   Glucose 80 (1/76/1607) 70 - 179 mg/dL 85 (3/71/0626) 70 - 948 mg/dL   Potassium 3.6 (5/46/2703) 3.4 - 4.8 mmol/L 3.6 (11/15/2023) 3.4 - 4.8 mmol/L   Sodium 143 (11/16/2023) 135 - 145 mmol/L 147 (H) (11/15/2023) 135 -  145 mmol/L     Coagulation - Results in Past 30 Days  Result Component Current Result Ref Range Previous Result Ref Range   APTT 22.4 (L) (10/18/2023) 24.8 - 38.4 sec Not in Time Range    INR 0.99 (10/18/2023)  Not in Time Range    PT 11.3 (10/18/2023) 9.9 - 12.6 sec Not in Time Range      Cardiac markers -   No results found for requested labs within last 30 days.     ABGs-   No results found for requested labs within last 30 days.     LFT's - Results in Past 30 Days  Result Component Current Result Ref Range Previous Result Ref Range   Albumin 2.9 (L) (11/16/2023) 3.4 - 5.0 g/dL 2.9 (L) (0/98/1191) 3.4 - 5.0 g/dL   Alkaline Phosphatase 160 (H) (11/16/2023) 46 - 116 U/L 164 (H) (11/15/2023) 46 - 116 U/L   ALT 31 (11/16/2023) 10 - 49 U/L 33 (11/15/2023) 10 - 49 U/L   AST 25 (11/16/2023) <=34 U/L 28 (11/15/2023) <=34 U/L   Total Bilirubin 0.5 (11/16/2023) 0.3 - 1.2 mg/dL 0.3 (4/78/2956) 0.3 - 1.2 mg/dL     Toxicology screen -   No results found for requested labs within last 30 days.   [      Relevant Studies/Radiology (if blank, then none):  CT Abdomen Pelvis W IV Contrast  Result Date: 11/15/2023  EXAM: CT ABDOMEN PELVIS W CONTRAST ACCESSION: [id number] UN REPORT DATE: 11/15/2023 6:12 PM CLINICAL INDICATION: 48 years old with h/o Crohn's, abd pain  COMPARISON: CT of the abdomen and pelvis 10/18/2023 TECHNIQUE: A helical CT scan of the abdomen and pelvis was obtained following IV contrast from the lung bases through the pubic symphysis. Images were reconstructed in the axial plane. Coronal and sagittal reformatted images were also provided for further evaluation. FINDINGS: LOWER CHEST: Bilateral dependent atelectasis. LIVER: Normal liver contour.  No focal liver lesions. BILIARY: The gallbladder is surgically absent. No biliary ductal dilatation.  SPLEEN: Normal in size and contour. PANCREAS: Normal pancreatic contour.  No focal lesions.  No ductal dilation. ADRENAL GLANDS: Normal appearance of the adrenal glands. KIDNEYS/URETERS: Symmetric renal enhancement.  No hydronephrosis. Subcentimeter hypoattenuating lesions bilaterally are too small for characterization. BLADDER: Unremarkable. REPRODUCTIVE ORGANS: Unremarkable. GI TRACT: Normal appearance of the stomach. Focal wall thickening of the distal second and proximal third portions of the duodenum (2:49-59). No other focal wall thickening or hyperenhancement is noted within the small bowel. Similar postsurgical changes of right hemicolectomy and ileocecectomy with ileocolic anastomosis. The colon is similarly apposed to the ventral abdominal wall where the rectus is diastatic, likely due to adhesions. There is gas and fluid distention of the distal small bowel measuring up to 5.8 cm in the neoterminal ileum and up to approximately 4.0 cm more proximally. This is grossly similar to prior exam. No gross abnormality of the remnant colon. PERITONEUM, RETROPERITONEUM AND MESENTERY: No free air.  No ascites.  No fluid collection. LYMPH NODES: No adenopathy. VESSELS: Hepatic and portal veins are patent.  Normal caliber aorta. Similar extensive abdominal wall collaterals. BONES and SOFT TISSUES: Similar multilevel degenerative change of the spine. Rectus diastasis. And atrophy of the right rectus abdominis muscle. Mild body wall edema. Similar locules of gas within the right rectus sheath; correlate with the recent injection/instrumentation to this region. Unfused right transverse ossification center of L1. Healing fractures of the anterior right seventh, eighth, and ninth ribs. Developing posttraumatic synostosis between the right seventh and sixth  ribs (2:1-6).     --Questionable focal wall thickening of the distal second and proximal third portions of the duodenum may reflect an infectious or inflammatory enteritis. Otherwise, no acute findings in the abdomen or pelvis.    ______________________________________________________________________  Discharge Instructions: Left AMA          Appointments which have been scheduled for you      Dec 14, 2023  COLONOSCOPY, FLEXIBLE, PROXIMAL TO SPLENIC FLEXURE; DIAGNOSTIC, W/WO COLLECTION SPECIMEN BY BRUSH OR WASH with Rana Burr, MD  GI PERIOP [facility]) [address]  [address]  [place] [address]  [phone]   None          ______________________________________________________________________  Discharge Day Services:  BP 105/80  - Pulse 67  - Temp 36.5 ??C (97.7 ??F) (Oral)  - Resp 16  - Ht 172.7 cm (5' 7.99)  - Wt 68 kg (150 lb)  - SpO2 100%  - BMI 22.81 kg/m??   Left AMA at ~1030am before he was seen by provider    Condition at Discharge: UTA.      Length of Discharge: I spent less than 30 mins in the discharge of this patient.

## 2023-11-16 NOTE — Unmapped (Addendum)
 Jerome Kennedy. is a 48 y.o. male with PMH of bipolar, IDA, COPD and s/p right hemicolectomy with end ileostomy who presented to [facility] with Exacerbation of Crohn's disease.    Patient noted abdominal pain which has been worsening since 4/12. Unable to tolerated food or liquid.  Denied any fevers or chills.  Notably had presented to emergency department multiple times. Last GI outpatient office note describes pain as likely functional. Had been pending a colonoscopy outpatient.  On admit reported medication adherence with adalimumab  and his other prescribed medications without dose misses. CT A/P demonstrated questionable focal wall thickening of the distal second and proximal third portions of duodenum. GI consulted for colonoscopy evaluation.       At approximately 10:15 RN notified me that patient had family emergency and will be leaving AMA.  By the time I got to patient's room for assessment at 10:30 AM he had already left the building.  Most recent vitals before AMA were stable.  Patient did sign AMA form before leaving to address family emergency.

## 2023-11-16 NOTE — Unmapped (Incomplete)
 [facility]   Initial Consultation         Assessment and Recommendations:   Jerome Hudnall. is a 48 y.o. 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 The patient is seen in consultation at the request of Dana Lasara Raines, ACNP ([facility] for Crohn's disease.    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 Reassuringly inflammatory markers are normal, but CT with questionable focal wall thickening of distal 2nd and proximal 3rd portion of duodenum which may reflect an infectious or inflammatory enteritis. Given these findings and with difficulty arranging procedures outpatient, favor ***    ALP elevation  Isoenzymes?    Recommendations:  -Follow-up C diff, GIPP  -Colonoscopy scheduled with Dr. Arnell Lange 5/12    {GI PREPROC (Optional):82851}    Issues Impacting Complexity of Management:  {uncgiriskqual (Optional):102023}    Recommendations discussed with the patient's primary team. {JLHGIFOLLOW:76858}    Subjective:   Presents to the ED frequently for similar symptoms. Presented 4 times in the last month. Most recently, he presented yesterday to the ED after developing acute on chronic R sided abdominal pain with NBNB emesis x2. Unable to  tolerate PO. Feels like past pain. Has not had BM since onset of symptoms, normally has 1 loose BM/day. Takes Humira  weekly, last received Sunday, does not report missed doses. No fevers/chills, no unusual foods, no sick contacts.    In the ED, afebrile, VSS. Labs notable for stable Hgb, Na 147, normal sCr, ALP 164, CRP <5. CT a/p with questionable focal wall thickening of distal 2nd and proximal 3rd portion of duodenum.   Has received multiple doses of opioids.   This AM feels ok, was able to tolerate some grits and clear liquids for breakfast.    He follows with Dr. Merlyn Starring, last seen 05/26/23 where he endorsed abdominal pain and nausea. Plan was to repeat endoscopic evaluation, which is currently scheduled with Dr. Arnell Lange 5/12.  He lives in an apartment alone and states that he has transportation to make medical appointments per primary team H&P.    -I have reviewed the patient's prior records from [facility] as summarized in the HPI    Objective:   Temp:  [36.5 ??C (97.7 ??F)-36.7 ??C (98.1 ??F)] 36.5 ??C (97.7 ??F)  Heart Rate:  [66-80] 67  Resp:  [16-19] 16  BP: (97-128)/(72-85) 105/80  SpO2:  [98 %-100 %] 100 %    Gen: Chronically-ill appearing male in NAD, answers questions appropriately  Abdomen: Soft, large reducible ventral hernia, RLQ tenderness without rebound/guarding  Extremities: No edema in the BLEs    Pertinent Labs & Studies:  -I have reviewed the patient's labs from 11/16/23 which show stable Hgb and stable renal function (SCr, electrolytes)    CT a/p with contrast 4/13  Impression   --Questionable focal wall thickening of the distal second and proximal third portions of the duodenum may reflect an infectious or inflammatory enteritis. Otherwise, no acute findings in the abdomen or pelvis.     CT a/p 3/16 with thickening of  neo-TI c/f active inflammation    EGD 10/01/22: small hiatal hernia, otherwise normal  Colonoscopy 10/01/22: SES-CD 0

## 2023-11-16 NOTE — Unmapped (Addendum)
 Pt A&Ox4, independent with cane. VSS. PRN for nausea and pain with positive effect. LR at 100. CL diet.  Pt called out at 1019 and told the [facility] to have someone disconnect him, he needs to go. He said there was a family emergency with his son and that he had someone picking him up. IV taken out. Pt given AMA forms and made aware that he was leaving against medical advice. He said he would not be driving. Provider immediately paged, form signed, and pt left. C. Diff sample not sent no BM overnight or today. Emotional and educational support provided.         Problem: Adult Inpatient Plan of Care  Goal: Plan of Care Review  Outcome: Patient Left AMA  Goal: Patient-Specific Goal (Individualized)  Outcome: Patient Left AMA  Goal: Absence of Hospital-Acquired Illness or Injury  Outcome: Patient Left AMA  Intervention: Identify and Manage Fall Risk  Recent Flowsheet Documentation  Taken 11/16/2023 0845 by Portia Brittle, RN  Safety Interventions:   aspiration precautions   commode/urinal/bedpan at bedside   environmental modification   fall reduction program maintained   low bed   lighting adjusted for tasks/safety   nonskid shoes/slippers when out of bed   room near unit station  Intervention: Prevent Skin Injury  Recent Flowsheet Documentation  Taken 11/16/2023 1003 by Portia Brittle, RN  Positioning for Skin: Supine/Back  Taken 11/16/2023 0845 by Portia Brittle, RN  Positioning for Skin: Supine/Back  Taken 11/16/2023 0815 by Portia Brittle, RN  Positioning for Skin: Supine/Back  Goal: Optimal Comfort and Wellbeing  Outcome: Patient Left AMA  Goal: Readiness for Transition of Care  Outcome: Patient Left AMA  Goal: Rounds/Family Conference  Outcome: Patient Left AMA     Problem: Infection  Goal: Absence of Infection Signs and Symptoms  Outcome: Patient Left AMA

## 2023-11-16 NOTE — Unmapped (Signed)
 Pt arrived from ED, and rested during the night. Pt requested PRN pain meds for 6/10 RLQ pain, see MAR. Pt reported nausea and itching, PRN meds given, see MAR.      Problem: Adult Inpatient Plan of Care  Goal: Plan of Care Review  Reactivated  Goal: Patient-Specific Goal (Individualized)  Reactivated  Goal: Absence of Hospital-Acquired Illness or Injury  Reactivated  Intervention: Identify and Manage Fall Risk  Recent Flowsheet Documentation  Taken 11/16/2023 0415 by Beatrice Lin, RN  Safety Interventions:   fall reduction program maintained   family at bedside  Taken 11/16/2023 0230 by Beatrice Lin, RN  Safety Interventions:   low bed   fall reduction program maintained  Taken 11/16/2023 0030 by Beatrice Lin, RN  Safety Interventions:   low bed   fall reduction program maintained  Intervention: Prevent Skin Injury  Recent Flowsheet Documentation  Taken 11/16/2023 0415 by Beatrice Lin, RN  Positioning for Skin: Supine/Back  Taken 11/16/2023 0230 by Beatrice Lin, RN  Positioning for Skin: Right  Taken 11/16/2023 0030 by Beatrice Lin, RN  Positioning for Skin: Supine/Back  Goal: Optimal Comfort and Wellbeing  Reactivated  Goal: Readiness for Transition of Care  Reactivated  Goal: Rounds/Family Conference  Reactivated

## 2023-11-20 NOTE — Unmapped (Signed)
 Pt BIB [facility], [place] bus [place]. Hx of crohns. C/O lower abd pain. Refused oral meds with ems.

## 2023-11-20 NOTE — Unmapped (Signed)
 BIB EMS CO ABD pain. Patient hx of crohns, hernia. Patient states last bowel movement was this morning and was watery.

## 2023-11-21 ENCOUNTER — Inpatient Hospital Stay: Admit: 2023-11-21 | Discharge: 2023-11-22 | Discharge status: Against Medical Advice | Payer: Medicaid (Managed Care)

## 2023-11-21 ENCOUNTER — Ambulatory Visit: Admit: 2023-11-21 | Discharge: 2023-11-22 | Discharge status: Against Medical Advice | Payer: Medicaid (Managed Care)

## 2023-11-21 LAB — URINALYSIS WITH MICROSCOPY WITH CULTURE REFLEX PERFORMABLE
BILIRUBIN UA: NEGATIVE
BLOOD UA: NEGATIVE
GLUCOSE UA: NEGATIVE
NITRITE UA: NEGATIVE
PH UA: 6 (ref 5.0–9.0)
PROTEIN UA: 30 — AB
RBC UA: 5 /HPF — ABNORMAL HIGH (ref <=3)
SPECIFIC GRAVITY UA: 1.032 — ABNORMAL HIGH (ref 1.003–1.030)
SQUAMOUS EPITHELIAL: 3 /HPF (ref 0–5)
TRANSITIONAL EPITHELIAL: <1 /HPF (ref 0–2)
UROBILINOGEN UA: <2
WBC UA: 20 /HPF — ABNORMAL HIGH (ref <=2)

## 2023-11-21 LAB — CBC W/ AUTO DIFF
BASOPHILS ABSOLUTE COUNT: 0 10*9/L (ref 0.0–0.1)
BASOPHILS RELATIVE PERCENT: 0.6 %
EOSINOPHILS ABSOLUTE COUNT: 0.1 10*9/L (ref 0.0–0.5)
EOSINOPHILS RELATIVE PERCENT: 2.2 %
HEMATOCRIT: 40.5 % (ref 39.0–48.0)
HEMOGLOBIN: 13 g/dL (ref 12.9–16.5)
LYMPHOCYTES ABSOLUTE COUNT: 2.1 10*9/L (ref 1.1–3.6)
LYMPHOCYTES RELATIVE PERCENT: 33.1 %
MEAN CORPUSCULAR HEMOGLOBIN CONC: 32 g/dL (ref 32.0–36.0)
MEAN CORPUSCULAR HEMOGLOBIN: 26.6 pg (ref 25.9–32.4)
MEAN CORPUSCULAR VOLUME: 83.1 fL (ref 77.6–95.7)
MEAN PLATELET VOLUME: 7.8 fL (ref 6.8–10.7)
MONOCYTES ABSOLUTE COUNT: 0.5 10*9/L (ref 0.3–0.8)
MONOCYTES RELATIVE PERCENT: 7.6 %
NEUTROPHILS ABSOLUTE COUNT: 3.6 10*9/L (ref 1.8–7.8)
NEUTROPHILS RELATIVE PERCENT: 56.5 %
PLATELET COUNT: 217 10*9/L (ref 150–450)
RED BLOOD CELL COUNT: 4.88 10*12/L (ref 4.26–5.60)
RED CELL DISTRIBUTION WIDTH: 15 % (ref 12.2–15.2)
WBC ADJUSTED: 6.4 10*9/L (ref 3.6–11.2)

## 2023-11-21 LAB — COMPREHENSIVE METABOLIC PANEL
ALBUMIN: 3.4 g/dL (ref 3.4–5.0)
ALKALINE PHOSPHATASE: 158 U/L — ABNORMAL HIGH (ref 46–116)
ALT (SGPT): 13 U/L (ref 10–49)
ANION GAP: 11 mmol/L (ref 5–14)
AST (SGOT): 16 U/L (ref <=34)
BILIRUBIN TOTAL: 0.6 mg/dL (ref 0.3–1.2)
BLOOD UREA NITROGEN: 11 mg/dL (ref 9–23)
BUN / CREAT RATIO: 11
CALCIUM: 8.8 mg/dL (ref 8.7–10.4)
CHLORIDE: 114 mmol/L — ABNORMAL HIGH (ref 98–107)
CO2: 21 mmol/L (ref 20.0–31.0)
CREATININE: 1.02 mg/dL (ref 0.73–1.18)
EGFR CKD-EPI (2021) MALE: >90 mL/min/1.73m2 (ref >=60)
GLUCOSE RANDOM: 86 mg/dL (ref 70–179)
POTASSIUM: 3.3 mmol/L — ABNORMAL LOW (ref 3.4–4.8)
PROTEIN TOTAL: 6.5 g/dL (ref 5.7–8.2)
SODIUM: 146 mmol/L — ABNORMAL HIGH (ref 135–145)

## 2023-11-21 LAB — LIPASE: LIPASE: 37 U/L (ref 12–53)

## 2023-11-21 LAB — C-REACTIVE PROTEIN: C-REACTIVE PROTEIN: 5.3 mg/L (ref <=10.0)

## 2023-11-21 MED ADMIN — promethazine (PHENERGAN) 12.5 mg in sodium chloride (NS) 0.9 % 50 mL IVPB: 12.5 mg | INTRAVENOUS | @ 09:00:00 | Stop: 2023-11-21

## 2023-11-21 MED ADMIN — cyanocobalamin (vitamin B-12) tablet 1,000 mcg: 1000 ug | ORAL | @ 14:00:00

## 2023-11-21 MED ADMIN — oxyCODONE (ROXICODONE) immediate release tablet 5 mg: 5 mg | ORAL | @ 16:00:00 | Stop: 2023-11-23

## 2023-11-21 MED ADMIN — promethazine (PHENERGAN) 12.5 mg in sodium chloride (NS) 0.9 % 50 mL IVPB: 12.5 mg | INTRAVENOUS | @ 06:00:00 | Stop: 2023-11-21

## 2023-11-21 MED ADMIN — iohexol (OMNIPAQUE) 350 mg iodine/mL solution 100 mL: 100 mL | INTRAVENOUS | @ 09:00:00 | Stop: 2023-11-21

## 2023-11-21 MED ADMIN — pantoprazole (Protonix) EC tablet 40 mg: 40 mg | ORAL | @ 14:00:00

## 2023-11-21 MED ADMIN — prochlorperazine (COMPAZINE) injection 5 mg: 5 mg | INTRAVENOUS | @ 16:00:00

## 2023-11-21 MED ADMIN — melatonin tablet 3 mg: 3 mg | ORAL | @ 21:00:00

## 2023-11-21 MED ADMIN — HYDROmorphone (PF) (DILAUDID) injection Syrg 1 mg: 1 mg | INTRAVENOUS | @ 10:00:00 | Stop: 2023-11-21

## 2023-11-21 MED ADMIN — HYDROmorphone (PF) (DILAUDID) injection Syrg 1 mg: 1 mg | INTRAVENOUS | @ 06:00:00 | Stop: 2023-11-21

## 2023-11-21 MED ADMIN — polyethylene glycol (GLYCOLAX) powder (BULK CONTAINER) 238 g: 238 g | ORAL | @ 21:00:00 | Stop: 2023-11-21

## 2023-11-21 MED ADMIN — diphenhydrAMINE (BENADRYL) injection 25 mg: 25 mg | INTRAVENOUS | @ 08:00:00 | Stop: 2023-11-21

## 2023-11-21 MED ADMIN — aluminum-magnesium hydroxide-simethicone (MAALOX MAX) 80-80-8 mg/mL oral suspension: 30 mL | ORAL | @ 06:00:00 | Stop: 2023-11-21

## 2023-11-21 MED ADMIN — fluticasone furoate-vilanterol (BREO ELLIPTA) 100-25 mcg/dose inhaler 1 puff: 1 | RESPIRATORY_TRACT | @ 14:00:00

## 2023-11-21 MED ADMIN — potassium chloride ER tablet 40 mEq: 40 meq | ORAL | @ 11:00:00 | Stop: 2023-11-21

## 2023-11-21 MED ADMIN — cholecalciferol (vitamin D3 25 mcg (1,000 units)) tablet 25 mcg: 25 ug | ORAL | @ 14:00:00

## 2023-11-21 MED ADMIN — sucralfate (CARAFATE) oral suspension: 1 g | ORAL | @ 06:00:00 | Stop: 2023-11-21

## 2023-11-21 MED ADMIN — sodium chloride 0.9% (NS) bolus 1,000 mL: 1000 mL | INTRAVENOUS | @ 10:00:00 | Stop: 2023-11-21

## 2023-11-21 MED ADMIN — cariprazine (VRAYLAR) capsule 1.5 mg: 1.5 mg | ORAL | @ 14:00:00

## 2023-11-21 NOTE — Unmapped (Signed)
 [facility] Medicine   History and Physical       Assessment and Plan     This is a 48 year old male with a history of known for Crohn's disease of the small and large intestine-initially diagnosed in the 1990s-complicated by an SBO historically requiring a hemicolectomy (right-sided) presenting with presumably a Crohn's flare.    #Clinical concern for Crohn's flare  The patient's symptoms described may be consistent with a active Crohn's flare though reassured by the fact that his inflammatory markers are not appreciably elevated.  Prior to empiric escalation of his medical therapy-would discuss his case with gastroenterology as they are well versed  with his case both inpatient and outpatient-with the specific question of the fertility of undergoing repeat invasive testing (i.e. colonoscopy) versus a short course of empiric steroids  - Collect a stool sample if available as outlined below.  - Will continue to monitor inflammatory markers  - GI pathogen panel/C. difficile ordered  - Nutrition consultation  - Consultation to GI in the morning    #Mild hypernatremia  -Encouraged p.o. water  intake for now    #Hypokalemia  - Likely secondary to volume loss p.o. stools  - Continue to monitor, replete as needed    #Asymptomatic pyuria/bacteriuria  No complaint of urinary symptoms at this time-no interesting that he does have evidence of mild pyuria without squamous cells.  Will not treat for infection at this time.  Will threshold to initiate antibiotics if he develops symptoms.    Chronic medical comorbidities  #Bipolar disorder: Continue home Vraylar , Seroquel   #COPD: Continue home Symbicort  (or formulary equivalent)  #GERD: Continue home Protonix     Prophylaxis  -Lovenox      Diet  -Nutrition Therapy Regular/House    Code Status / HCDM  -Full Code, Discussed with patient at the time of admission   -  HCDM (patient stated preference):   Parker,Latoya (Friend)  [phone] (Mobile)     Anticipated Medically Ready for Discharge: Anticipated in 2-4 Days      HPI    48 year old male with a history notable for bipolar disorder, COPD, Crohn's disease status post right hemicolectomy with a recent hospitalization for presumed Crohn's flare-but unfortunately had to leave AMA secondary to family emergency at (11/16/2023).  Presents today for readmission given ongoing discomfort.    Patient reports that the day prior to admission he developed poor p.o. intake, nausea with 2 episodes of vomiting.  This was also met with 2 episodes of loose stools in the morning-though has not moved his bowels for the remainder of the day on 11/20/2023.  No recent sick contacts.  He denies any significant fevers or chills.  Of the day prior to that-endorses that he had approximately 3 bowel movements which were all loose in quality-nonbloody.  As result of poor p.o. intake and recurrence of pain-particularly in the right lower quadrant he presented to the emergency department today.    In the emergency department, he was noted to be afebrile, hemodynamically stable.  Initial labs notable for CBC without acute abnormalities.  BMP notable for hyponatremia with a sodium of 146, potassium of 3.3.  Hepatic function grossly unremarkable he does have a chronically elevated alkaline phosphatase-elevated at 158 on arrival.  Lipase within normal limits at 37.  CRP within normal limits at 5.3.  Urinalysis with 20 white cells, small leukocyte esterase.  Preliminary report of the CT abdomen pelvis demonstrating new multifocal bowel wall thickening and hyperenhancement within the right lower quadrant large bowel and sigmoid  colon to be secondary to colitis (presumably active)-final read pending at the time of this documentation.    Patient was given several doses of IV pain medication in the emergency department and sulcal fate/Maalox for abdominal discomfort.  Admitted for further workup and management of the above.    Will note that during his previous hospitalization plan was for involvement of gastroenterology for consideration of colonoscopy to evaluate the extent of his active disease.  No disease modifying agents were initiated during hospitalization-so the patient did leave prior to completing his full workup-and also prior to starting on appropriate therapy.  Will also note that the patient had a GI PCR at that time ordered but not collected-C. difficile assay also ordered but not collected.      Med Rec Confidence   I reviewed the patient's medication with him today-he was able to endorse that he takes all of his medications as prescribed-though would verify with pharmacy given some concern about the potential for adherence.    Physical Exam   Temp:  [36.6 ??C (97.9 ??F)] 36.6 ??C (97.9 ??F)  Pulse:  [71-76] 76  Resp:  [19] 19  BP: (100-120)/(68-82) 100/68  SpO2:  [98 %-100 %] 100 %  Body mass index is 22.81 kg/m??.  General: Well-appearing male, sitting in bed in no acute distress  HEENT: Dry mucous membranes  Respiratory: Clear to auscultation bilaterally, no increased work of breathing on room air  Cardiovascular: Regular rate and rhythm no appreciable murmurs rubs or gallops  GI: Normoactive bowel sounds, soft, tenderness to palpation specifically of the right lower quadrant-she does also have a very large periumbilical hernia  MSK: No appreciable gross musculoskeletal abnormalities  Neuro: Alert and conversant  Psych: Calm pleasant      ___________________________________________________________________    Medications     Prior to Admission medications    Medication Dose, Route, Frequency   cariprazine  (VRAYLAR ) 1.5 mg capsule 1.5 mg, Oral, Daily (standard)   cholecalciferol , vitamin D3 25 mcg, 1,000 units,, 1,000 unit (25 mcg) tablet 25 mcg, Oral, Daily (standard)   cyanocobalamin , vitamin B-12, (VITAMIN B-12) 1000 MCG tablet 1,000 mcg, Oral, Daily (standard)   dicyclomine  (BENTYL ) 10 mg capsule 10 mg, Oral, 4 times a day (ACHS)   empty container Misc Use as directed FLONASE  ALLERGY RELIEF 50 mcg/actuation nasal spray 1 spray, Daily (standard)   HUMIRA  PEN CITRATE FREE 40 MG/0.4 ML Inject the contents of 1 pen (40 mg total) under the skin every seven (7) days.   OPTICHAMBER DIAMOND VHC Spcr    pantoprazole  (PROTONIX ) 40 MG tablet 40 mg, Oral, Daily (standard)   polyethylene glycol (MIRALAX ) 17 gram packet Take 17 g (1 packet in 4-8 oz of liquid)  by mouth daily.   QUEtiapine  (SEROQUEL ) 100 MG tablet Take 1 tablet (100 mg total) by mouth nightly.   SYMBICORT  80-4.5 mcg/actuation inhaler 2 puffs, 2 times daily (RT)   VENTOLIN  HFA 90 mcg/actuation inhaler 1 puff, Every 6 hours PRN       Allergies   Morphine  and Toradol [ketorolac]     Medical History     Past Medical History:   Diagnosis Date    Anemia 04/05/2022    Anxiety     Avascular necrosis of femur head, right 2011    Bipolar disorder 2013    Follows the Psychiatry    Cannabis use disorder     COPD (chronic obstructive pulmonary disease) 2022    Crohn's disease 1990    Depression 2007  Severe depressive episode with psychotic symptoms    DVT (deep venous thrombosis) 02/08/2010    DVT of superior vena cava, left brachiocephalic, 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 He started smoking about 22 years ago. He has a 10.5 pack-year smoking history. He has been exposed to tobacco smoke. He has never used smokeless tobacco.  Alcohol use:   reports no history of alcohol use.  Drug use:  reports current drug use. Drug: Marijuana.    Family History     Family History   Problem Relation Age of Onset    Breast cancer Mother     Diabetes Maternal Aunt     Colon cancer Maternal Grandmother     Prostate cancer Maternal Grandfather     Cancer Paternal Grandmother     Crohn's disease Neg Hx        Surgical History     Past Surgical History:   Procedure Laterality Date    CHOLECYSTECTOMY COLON SURGERY      partial resection    HERNIA REPAIR      PR ARTHRP INTERCARPAL/CARP/MTCRPL JT INTERPOSITION Left 08/07/2022    Procedure: INTERPOSIT ARTHROPLASTY-INTERCARPAL/CARPOMETACAR;  Surgeon: Adonna Hoots, MD;  Location: MAIN OR [facility];  Service: Orthopedics    PR CLOSE ENTEROSTOMY,RESEC+COLOREC ANAS Midline 11/01/2019    Procedure: CLO ENTEROSTOMY; W/RESECT Lethaniel Rave;  Surgeon: Eusebio High, MD;  Location: MAIN OR [place];  Service: Gastrointestinal    PR COLONOSCOPY FLX DX W/COLLJ SPEC WHEN PFRMD Left 01/07/2013    Procedure: COLONOSCOPY, FLEXIBLE, PROXIMAL TO SPLENIC FLEXURE; DIAGNOSTIC, W/WO COLLECTION SPECIMEN BY BRUSH OR WASH;  Surgeon: Lizbeth Right, MD;  Location: GI PROCEDURES [facility];  Service: Gastroenterology    PR COLONOSCOPY FLX DX W/COLLJ SPEC WHEN PFRMD  03/09/2015    Procedure: COLONOSCOPY, FLEXIBLE, PROXIMAL TO SPLENIC FLEXURE; DIAGNOSTIC, W/WO COLLECTION SPECIMEN BY BRUSH OR WASH;  Surgeon: Delene Feinstein, MD;  Location: GI PROCEDURES [facility];  Service: Gastroenterology    PR COLONOSCOPY FLX DX W/COLLJ SPEC WHEN PFRMD N/A 10/28/2019    Procedure: COLONOSCOPY, FLEXIBLE, PROXIMAL TO SPLENIC FLEXURE; DIAGNOSTIC, W/WO COLLECTION SPECIMEN BY BRUSH OR WASH;  Surgeon: Sarajane Cumming, MD;  Location: GI PROCEDURES [facility];  Service: Gastroenterology    PR COLONOSCOPY FLX DX W/COLLJ SPEC WHEN PFRMD N/A 10/01/2022    Procedure: COLONOSCOPY, FLEXIBLE, PROXIMAL TO SPLENIC FLEXURE; DIAGNOSTIC, W/WO COLLECTION SPECIMEN BY BRUSH OR WASH;  Surgeon: Rana Burr, MD;  Location: GI PROCEDURES [facility];  Service: Gastroenterology    PR EXPLORATORY OF ABDOMEN Midline 09/02/2019    Procedure: EXPLORATORY LAPAROTOMY, EXPLORATORY CELIOTOMY WITH OR WITHOUT BIOPSY(S);  Surgeon: Eusebio High, MD;  Location: MAIN OR [facility];  Service: Gastrointestinal    PR EXPLORATORY OF ABDOMEN Midline 11/01/2019    Procedure: EXPLORATORY LAPAROTOMY, EXPLORATORY CELIOTOMY WITH OR WITHOUT BIOPSY(S);  Surgeon: Eusebio High, MD;  Location: MAIN OR [facility];  Service: Gastrointestinal    PR FIX FINGER,VOLAR PLATE,I-P JT Left 08/07/2022    Procedure: REPAIR AND RECONSTRUCTION, FINGER, VOLAR PLATE, INTERPHALANGEAL JOINT;  Surgeon: Adonna Hoots, MD;  Location: MAIN OR [facility];  Service: Orthopedics    PR FREEING BOWEL ADHESION,ENTEROLYSIS N/A 11/01/2019    Procedure: Enterolysis (Separt Proc);  Surgeon: Eusebio High, MD;  Location: MAIN OR [facility];  Service: Gastrointestinal  PR FUSION MC-P JT Left 08/27/2023    Procedure: ARTHRODESIS, METACARPOPHALANGEAL JOINT, WITH OR WITHOUT INTERNAL FIXATION;  Surgeon: Adonna Hoots, MD;  Location: OR [facility];  Service: Orthopedics    PR ILEOSCOPY THRU STOMA,BIOPSY N/A 10/14/2019    Procedure: Erby Hatcher; Lamarr Pilling 1/MX;  Surgeon: Loel Ring, MD;  Location: GI PROCEDURES [facility];  Service: Gastroenterology    PR PART REMOVAL COLON W COLOSTOMY Midline 09/02/2019    Procedure: COLECTOMY, PARTIAL; WITH SKIN LEVEL CECOSTOMY OR COLOSTOMY;  Surgeon: Eusebio High, MD;  Location: MAIN OR [facility];  Service: Gastrointestinal    PR REPAIR INCISIONAL HERNIA,REDUCIBLE Midline 09/02/2019    Procedure: REPAIR INIT INCISIONAL OR VENTRAL HERNIA; REDUCIBLE;  Surgeon: Caro Christmas, MD;  Location: MAIN OR [place];  Service: Trauma    PR UPPER GI ENDOSCOPY,BIOPSY N/A 01/07/2013    Procedure: UGI ENDOSCOPY; WITH BIOPSY, SINGLE OR MULTIPLE;  Surgeon: Lizbeth Right, MD;  Location: GI PROCEDURES [facility];  Service: Gastroenterology    PR UPPER GI ENDOSCOPY,BIOPSY N/A 10/14/2019    Procedure: UGI ENDOSCOPY; WITH BIOPSY, SINGLE OR MULTIPLE;  Surgeon: Loel Ring, MD;  Location: GI PROCEDURES [facility];  Service: Gastroenterology    PR UPPER GI ENDOSCOPY,DIAGNOSIS N/A 10/01/2022    Procedure: UGI ENDO, INCLUDE ESOPHAGUS, STOMACH, & DUODENUM &/OR JEJUNUM; DX W/WO COLLECTION SPECIMN, BY BRUSH OR WASH;  Surgeon: Rana Burr, MD;  Location: GI PROCEDURES [facility];  Service: Gastroenterology

## 2023-11-21 NOTE — Unmapped (Signed)
 [facility] Consult [facility]   Initial Consultation         Assessment and Recommendations:   Jerome Kennedy. is a 48 y.o. 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 The patient is seen in consultation at the request of Michael J Neuss, MD PhD ([facility] for Crohn's disease.    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  Of note, he has been seen by the GI consult service multiple times, most recently presented 5 days ago and left AMA. Last colonoscopy was February 2024 which showed endoscopic remission with SES-CD of 0 of examined colon, but they were unable to complete the exam or evaluate the ileocolonic anastomosis due to significant stool.    This admission, CT scan with new multifocal bowel wall thickening (new from 4/14) and enhancement in the right lower quadrant suspicious for colitis. Even though he has had no evidence of colonic inflammation on last colonoscopy, he has not had an endoscopic evaluation of his neo-terminal ileum following his last surgery in 2021 and therefore cannot rule out small bowel inflammation. Given multiple poor preps in the past, recommend performing a 2-day bowel prep which will be essential in order to evaluate anastomosis and the small bowel.     Recommendations:   Follow-up C. difficile, GIPP, fecal calprotectin   Plan for colonoscopy with evaluation of neo-terminal ileum on Monday  Will use abdominal binder given large ventral hernia  Educated patient on the importance of completing bowel prep in order to be able to assess proximal bowel; prefers Miralax  prep    GI Pre-Procedure Checklist  Procedure: Colonoscopy  Anticipated Date of Procedure: 4/21  Anticoagulants/Antiplatelets: Hold DVT prophylaxis day of procedure  Anesthesia Concerns: None  Diet: Order clear liquid and make NPO at 12AM (midnight) day of procedure  Prep: Give 4L Gatorade+Miralax  bowel prep this afternoon. Order bowel prep using MED GI PROCEDURES PREP order-set. You must select the appropriate prep, this is not auto-selected. The patient is adequately prepped when their stool is clear and yellow, like urine. Continue to order bowel prep until the patients stools are clear. The patient can continue drinking prep past midnight if not clear, but must be strictly NPO of all oral intake for at least 2 hours before procedure.    Issues Impacting Complexity of Management:  -None    Recommendations discussed with the patient's primary team. We will continue to follow along with you.    For questions, contact the on-call fellow for the Gastroenterology ([facility]) Consult Service.    Subjective:   Presenting with right lower quadrant pain, p.o. intolerance, nausea that has been worsening since yesterday.  Says that when he tries to eat he throws up immediately.  At baseline he normally has around 2 formed stools a day, but yesterday they were watery.  Last bowel movement was yesterday, but is currently passing gas.  Has been taking Humira  weekly without issues.      Recently presented to the ED 4/14 with  similar symptoms, CT scan with questionable inflammation of the duodenum but otherwise normal.  GI was consulted, but was unable to see him because he left for a family emergency.  Had a colonoscopy scheduled on May 12 as an outpatient.    Follows with Dr. Merlyn Starring in IBD clinic, last clinic visit October 2024.  Has had numerous ED visits and admissions over the past few years for abdominal pain.  Multiple CT scans showed evidence of bowel wall thickening of the neoterminal ileum and upstream dilation.  Staging colonoscopy completed February 2024 without any active disease and patient has been continued on Humira .  Patient last seen by GI consults November 2024; recommended that he continue Humira  weekly and schedule his colonoscopy and follow-up in clinic as an outpatient. Patient left AMA.     On labs, no leukocytosis, no anemia, normal albumin, CRP 5.  CTAP today shows new multifocal bowel wall thickening and enhancement in the right lower quadrant suspicious for colitis.    Current medications:  Humira  weekly (07/2022- )    Prior medications:   Remicade - failed at [facility]    Prior GI work-up:  10/01/22 Colonoscopy: Stool in the entire colon, procedure had to be aborted. The neoterminal ileum was not intubated SES 0  10/01/22 EGD: normal   10/14/19 Colonoscopy: Unable to advance adult gastroscope beyond 40 cm   10/14/19 Ileoscopy: Normal ileum    Objective:   Temp:  [36.4 ??C (97.5 ??F)-36.6 ??C (97.9 ??F)] 36.4 ??C (97.5 ??F)  Pulse:  [71-78] 78  Resp:  [18-19] 18  BP: (100-120)/(67-82) 114/67  SpO2:  [98 %-100 %] 100 %    Gen: Chronically ill-appearing male in NAD, answers questions appropriately  Abdomen: Soft, TTP in the RLQ, significant protruding ventral hernia, soft, no rebound/guarding  Extremities: No edema in the BLEs    Pertinent Labs/Studies:  -I have reviewed the patient's labs from 11/21/23 which show stable Hgb, stable renal function (SCr, electrolytes), and stable CRP    Last 3 fecal calprotectins:  Lab Results   Component Value Date    NULL 64.2 (H) 04/26/2023    NULL 266 (H) 06/20/2022

## 2023-11-21 NOTE — Unmapped (Signed)
 [facility]  Emergency Department Provider Note    ED Clinical Impression     Final diagnoses:   Abdominal pain, unspecified abdominal location (Primary)       HPI, ED Course, Assessment and Plan     Initial Clinical Impression:    November 21, 2023 1:26 AM   Jerome Iyengar. is a 48 y.o. male with past medical history including Crohn's, large abdominal hernia, COPD, prior DVT, prior SBO, here for evaluation of abdominal pain with associated vomiting and diarrhea.  He reports that his symptom started today.  He had normal bowel movement yesterday.  No vomiting yesterday.  It is unusual for him to vomit.  He has diffuse abdominal pain.  No fever or chills.    BP 108/75  - Pulse 76  - Temp 36.6 ??C (97.9 ??F) (Oral)  - Resp 19  - Wt 68 kg (150 lb)  - SpO2 100%  - BMI 22.81 kg/m??     Medical Decision Making  48 year old male with past medical history including Crohn's here for evaluation of abdominal pain with associated vomiting and diarrhea.  I considered multiple possible etiologies of his symptoms including not limited to Crohn's flare or exacerbation including abscess or fistula, also considered SBO given his history, could also be viral gastroenteritis.  Labs and CT to evaluate.  Will treat symptoms and reassess.    Further ED updates and updates to plan as per ED Course below:     Signed out patient pending completion of workup and disposition.    Social Drivers of Health with Concerns     Food Insecurity: Not on File (04/30/2023)    Received from [employer] [facility]     Food: 0   Tobacco Use: High Risk (11/20/2023)    Patient History     Smoking Tobacco Use: Every Day     Smokeless Tobacco Use: Never     Passive Exposure: Current   Alcohol Use: Not on file   Physical Activity: Not on File (06/21/2021)    Received from [place], [place]    Physical Activity     Physical Activity: 0   Stress: Not on File (06/21/2021)    Received from [place], [place]    Stress     Stress: 0   Substance Use: Not on file (06/10/2023) Social Connections: Not on File (04/18/2023)    Received from [employer]    Social Connections     Connectedness: 0   Internet Connectivity: Not on file   Health Literacy: Not on file     _____________________________________________________________________    The case was discussed with the attending physician who is in agreement with the above assessment and plan    Past History     PAST MEDICAL HISTORY/PAST SURGICAL HISTORY:   Past Medical History:   Diagnosis Date    Anemia 04/05/2022    Anxiety     Avascular necrosis of femur head, right 2011    Bipolar disorder 2013    Follows the Psychiatry    Cannabis use disorder     COPD (chronic obstructive pulmonary disease) 2022    Crohn's disease 1990    Depression 2007    Severe depressive episode with psychotic symptoms    DVT (deep venous thrombosis) 02/08/2010    DVT of superior vena cava, left brachiocephalic, 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    Myalgia     Rhinovirus infection 10/29/2022    SBO (small bowel obstruction) 2011  Past Surgical History:   Procedure Laterality Date    CHOLECYSTECTOMY      COLON SURGERY      partial resection    HERNIA REPAIR      PR ARTHRP INTERCARPAL/CARP/MTCRPL JT INTERPOSITION Left 08/07/2022    Procedure: INTERPOSIT ARTHROPLASTY-INTERCARPAL/CARPOMETACAR;  Surgeon: Adonna Hoots, MD;  Location: MAIN OR [facility];  Service: Orthopedics    PR CLOSE ENTEROSTOMY,RESEC+COLOREC ANAS Midline 11/01/2019    Procedure: CLO ENTEROSTOMY; W/RESECT Lethaniel Rave;  Surgeon: Eusebio High, MD;  Location: MAIN OR [place];  Service: Gastrointestinal    PR COLONOSCOPY FLX DX W/COLLJ SPEC WHEN PFRMD Left 01/07/2013    Procedure: COLONOSCOPY, FLEXIBLE, PROXIMAL TO SPLENIC FLEXURE; DIAGNOSTIC, W/WO COLLECTION SPECIMEN BY BRUSH OR WASH;  Surgeon: Lizbeth Right, MD;  Location: GI PROCEDURES [facility];  Service: Gastroenterology    PR COLONOSCOPY FLX DX W/COLLJ SPEC WHEN PFRMD  03/09/2015    Procedure: COLONOSCOPY, FLEXIBLE, PROXIMAL TO SPLENIC FLEXURE; DIAGNOSTIC, W/WO COLLECTION SPECIMEN BY BRUSH OR WASH;  Surgeon: Delene Feinstein, MD;  Location: GI PROCEDURES [facility];  Service: Gastroenterology    PR COLONOSCOPY FLX DX W/COLLJ SPEC WHEN PFRMD N/A 10/28/2019    Procedure: COLONOSCOPY, FLEXIBLE, PROXIMAL TO SPLENIC FLEXURE; DIAGNOSTIC, W/WO COLLECTION SPECIMEN BY BRUSH OR WASH;  Surgeon: Sarajane Cumming, MD;  Location: GI PROCEDURES [facility]IAL [facility];  Service: Gastroenterology    PR COLONOSCOPY FLX DX W/COLLJ SPEC WHEN PFRMD N/A 10/01/2022    Procedure: COLONOSCOPY, FLEXIBLE, PROXIMAL TO SPLENIC FLEXURE; DIAGNOSTIC, W/WO COLLECTION SPECIMEN BY BRUSH OR WASH;  Surgeon: Rana Burr, MD;  Location: GI PROCEDURES [facility];  Service: Gastroenterology    PR EXPLORATORY OF ABDOMEN Midline 09/02/2019    Procedure: EXPLORATORY LAPAROTOMY, EXPLORATORY CELIOTOMY WITH OR WITHOUT BIOPSY(S);  Surgeon: Eusebio High, MD;  Location: MAIN OR [facility];  Service: Gastrointestinal    PR EXPLORATORY OF ABDOMEN Midline 11/01/2019    Procedure: EXPLORATORY LAPAROTOMY, EXPLORATORY CELIOTOMY WITH OR WITHOUT BIOPSY(S);  Surgeon: Eusebio High, MD;  Location: MAIN OR [facility];  Service: Gastrointestinal    PR FIX FINGER,VOLAR PLATE,I-P JT Left 08/07/2022    Procedure: REPAIR AND RECONSTRUCTION, FINGER, VOLAR PLATE, INTERPHALANGEAL JOINT;  Surgeon: Adonna Hoots, MD;  Location: MAIN OR [facility];  Service: Orthopedics    PR FREEING BOWEL ADHESION,ENTEROLYSIS N/A 11/01/2019    Procedure: Enterolysis (Separt Proc);  Surgeon: Eusebio High, MD;  Location: MAIN OR [facility];  Service: Gastrointestinal    PR FUSION MC-P JT Left 08/27/2023    Procedure: ARTHRODESIS, METACARPOPHALANGEAL JOINT, WITH OR WITHOUT INTERNAL FIXATION;  Surgeon: Adonna Hoots, MD;  Location: OR [facility];  Service: Orthopedics    PR ILEOSCOPY THRU STOMA,BIOPSY N/A 10/14/2019    Procedure: Erby Hatcher; Lamarr Pilling 1/MX;  Surgeon: Loel Ring, MD;  Location: GI PROCEDURES [facility];  Service: Gastroenterology PR PART REMOVAL COLON W COLOSTOMY Midline 09/02/2019    Procedure: COLECTOMY, PARTIAL; WITH SKIN LEVEL CECOSTOMY OR COLOSTOMY;  Surgeon: Eusebio High, MD;  Location: MAIN OR [facility];  Service: Gastrointestinal    PR REPAIR INCISIONAL HERNIA,REDUCIBLE Midline 09/02/2019    Procedure: REPAIR INIT INCISIONAL OR VENTRAL HERNIA; REDUCIBLE;  Surgeon: Caro Christmas, MD;  Location: MAIN OR [place];  Service: Trauma    PR UPPER GI ENDOSCOPY,BIOPSY N/A 01/07/2013    Procedure: UGI ENDOSCOPY; WITH BIOPSY, SINGLE OR MULTIPLE;  Surgeon: Lizbeth Right, MD;  Location: GI PROCEDURES [facility];  Service: Gastroenterology    PR UPPER GI ENDOSCOPY,BIOPSY N/A 10/14/2019    Procedure: UGI ENDOSCOPY; WITH BIOPSY, SINGLE  OR MULTIPLE;  Surgeon: Loel Ring, MD;  Location: GI PROCEDURES [facility];  Service: Gastroenterology    PR UPPER GI ENDOSCOPY,DIAGNOSIS N/A 10/01/2022    Procedure: UGI ENDO, INCLUDE ESOPHAGUS, STOMACH, & DUODENUM &/OR JEJUNUM; DX W/WO COLLECTION SPECIMN, BY BRUSH OR WASH;  Surgeon: Rana Burr, MD;  Location: GI PROCEDURES [facility];  Service: Gastroenterology       MEDICATIONS:     Current Facility-Administered Medications:     sucralfate  (CARAFATE ) oral suspension, 1 g, Oral, Once **AND** aluminum -magnesium  hydroxide-simethicone  (MAALOX MAX) 80-80-8 mg/mL oral suspension, 30 mL, Oral, Once, Coley Littles, Sherrlyn Dolores, MD    ondansetron  (ZOFRAN ) injection 4 mg, 4 mg, Intravenous, Once, Avary Pitsenbarger, Sherrlyn Dolores, MD    Current Outpatient Medications:     cariprazine  (VRAYLAR ) 1.5 mg capsule, Take 1 capsule (1.5 mg total) by mouth daily., Disp: , Rfl:     cholecalciferol , vitamin D3 25 mcg, 1,000 units,, 1,000 unit (25 mcg) tablet, Take 1 tablet (25 mcg total) by mouth daily., Disp: 30 tablet, Rfl: 0    cyanocobalamin , vitamin B-12, (VITAMIN B-12) 1000 MCG tablet, Take 1 tablet (1,000 mcg total) by mouth daily., Disp: , Rfl:     dicyclomine  (BENTYL ) 10 mg capsule, Take 1 capsule (10 mg total) by mouth Four (4) times a day (before meals and nightly)., Disp: 120 capsule, Rfl: 3    empty container Misc, Use as directed, Disp: 1 each, Rfl: 3    FLONASE  ALLERGY RELIEF 50 mcg/actuation nasal spray, 1 spray into each nostril daily., Disp: , Rfl:     HUMIRA  PEN CITRATE FREE 40 MG/0.4 ML, Inject the contents of 1 pen (40 mg total) under the skin every seven (7) days., Disp: 4 each, Rfl: 5    OPTICHAMBER DIAMOND [facility] Spcr, , Disp: , Rfl:     pantoprazole  (PROTONIX ) 40 MG tablet, Take 1 tablet (40 mg total) by mouth daily., Disp: 90 tablet, Rfl: 0    polyethylene glycol (MIRALAX ) 17 gram packet, Take 17 g (1 packet in 4-8 oz of liquid)  by mouth daily., Disp: 90 packet, Rfl: 3    QUEtiapine  (SEROQUEL ) 100 MG tablet, Take 1 tablet (100 mg total) by mouth nightly., Disp: , Rfl:     SYMBICORT  80-4.5 mcg/actuation inhaler, Inhale 2 puffs  in the morning and 2 puffs in the evening., Disp: , Rfl:     VENTOLIN  HFA 90 mcg/actuation inhaler, Inhale 1 puff every six (6) hours as needed., Disp: , Rfl:     ALLERGIES:   Morphine  and Toradol [ketorolac]    SOCIAL HISTORY:   Social History     Tobacco Use    Smoking status: Every Day     Current packs/day: 0.30     Average packs/day: 0.5 packs/day for 22.1 years (10.5 ttl pk-yrs)     Types: Cigarettes     Start date: 10/02/2001     Passive exposure: Current    Smokeless tobacco: Never    Tobacco comments:     Reduced use from 10cpd to 5cpd, though relights each one. Motivated to quit.    Substance Use Topics    Alcohol use: No     Alcohol/week: 0.0 standard drinks of alcohol       FAMILY HISTORY:  Family History   Problem Relation Age of Onset    Breast cancer Mother     Diabetes Maternal Aunt     Colon cancer Maternal Grandmother     Prostate cancer Maternal Grandfather     Cancer Paternal  Grandmother     Crohn's disease Neg Hx         Review of Systems     A review of systems was performed and relevant portions were as noted above in HPI     Physical Exam     VITAL SIGNS:    BP 108/75  - Pulse 76  - Temp 36.6 ??C (97.9 ??F) (Oral)  - Resp 19  - Wt 68 kg (150 lb)  - SpO2 100%  - BMI 22.81 kg/m??       Constitutional:   Alert and oriented.  Chronically ill-appearing, appears not to feel well  Head:   Normocephalic and atraumatic  Eyes:   Conjunctivae are normal, EOMI, PERRL  ENT:   No notable congestion, Mucous membranes moist, External ears normal, no notable stridor  Cardiovascular:   Rate as vitals above. Appears warm and well perfused  Respiratory:   Normal respiratory effort. Breath sounds are normal.  Gastrointestinal:   Soft, large abdominal hernia, diffuse tenderness to palpation without rebound or guarding  Genitourinary:   Deferred  Musculoskeletal:    Normal range of motion in all extremities. No tenderness or edema noted in B/L lower extremities  Neurologic:   No gross focal neurologic deficits beyond baseline are appreciated.  Skin:   Skin is warm, dry and intact.       Radiology     CT Abdomen Pelvis W IV Contrast Only    (Results Pending)       Labs     Labs Reviewed   CBC W/ DIFFERENTIAL    Narrative:     The following orders were created for panel order CBC w/ Differential.                  Procedure                               Abnormality         Status                                     ---------                               -----------         ------                                     CBC w/ Differential[[id number]]                                                                                          Please view results for these tests on the individual orders.   COMPREHENSIVE METABOLIC PANEL   LIPASE   URINALYSIS WITH MICROSCOPY WITH CULTURE REFLEX    Narrative:     The following orders were created for panel order Urinalysis with Microscopy with Culture Reflex.  Procedure                               Abnormality         Status                                     ---------                               -----------         ------ Urinalysis with Microsc.Aaron AasAaron Aas[[id number]]                                                                                   Please view results for these tests on the individual orders.   C-REACTIVE PROTEIN   CBC W/ AUTO DIFF   URINALYSIS WITH MICROSCOPY WITH CULTURE REFLEX PERFORMABLE         Pertinent labs & imaging results that were available during my care of the patient were reviewed by me and considered in my medical decision making (see chart for details).    Please note- This chart has been created using [employer]. Chart creation errors have been sought, but may not always be located and such creation errors, especially pronoun confusion, do NOT reflect on the standard of medical care.       Coolidge Dent, MD  Resident  11/21/23 [phone]

## 2023-11-21 NOTE — Unmapped (Addendum)
 Pt arrived to unit 0950, is alert and oriented x4, room air, vital signs stable, and 6/10 pain in the abdomen. MD notified that pt still in pain even after PRN med. Started pt on bowel prep and is willing to attempt but stated if he started throwing up he would not continue unless his pain and nausea was addressed first. Pt persistently nauseated and has had 1 emesis occurrence today.    Problem: Adult Inpatient Plan of Care  Goal: Plan of Care Review  Flowsheets (Taken 11/21/2023 1544)  Plan of Care Reviewed With: patient  Goal: Patient-Specific Goal (Individualized)  Reactivated  Goal: Absence of Hospital-Acquired Illness or Injury  Reactivated  Intervention: Identify and Manage Fall Risk  Recent Flowsheet Documentation  Taken 11/21/2023 1400 by Joie Narrow, Atonya Templer V, RN  Safety Interventions:   environmental modification   fall reduction program maintained   low bed  Taken 11/21/2023 1200 by Joie Narrow, Lyly Canizales V, RN  Safety Interventions:   environmental modification   fall reduction program maintained   low bed  Taken 11/21/2023 1000 by Joie Narrow, Brittiney Dicostanzo V, RN  Safety Interventions:   environmental modification   fall reduction program maintained   low bed  Intervention: Prevent Skin Injury  Recent Flowsheet Documentation  Taken 11/21/2023 1400 by Joie Narrow, Nydia Ytuarte V, RN  Positioning for Skin: Supine/Back  Taken 11/21/2023 1200 by Joie Narrow, Jewett Mcgann V, RN  Positioning for Skin: Supine/Back  Intervention: Prevent and Manage VTE (Venous Thromboembolism) Risk  Recent Flowsheet Documentation  Taken 11/21/2023 1400 by Joie Narrow, Taino Maertens V, RN  Anti-Embolism Device Status: (lovenox  subq) Other (Comment)  Taken 11/21/2023 1200 by Joie Narrow, Zeferino Mounts V, RN  Anti-Embolism Device Status: (lovenox  subq) Other (Comment)  Taken 11/21/2023 1000 by Joie Narrow, Kallan Bischoff V, RN  Anti-Embolism Device Status: (lovenox  subq) Other (Comment)  Goal: Optimal Comfort and Wellbeing  Reactivated  Goal: Readiness for Transition of Care  Reactivated  Goal: Rounds/Family Conference  Reactivated     Problem: Infection  Goal: Absence of Infection Signs and Symptoms  Reactivated     Problem: Fall Injury Risk  Goal: Absence of Fall and Fall-Related Injury  Reactivated  Intervention: Promote Injury-Free Environment  Recent Flowsheet Documentation  Taken 11/21/2023 1400 by Joie Narrow, Ashtin Melichar V, RN  Safety Interventions:   environmental modification   fall reduction program maintained   low bed  Taken 11/21/2023 1200 by Joie Narrow, Lilymae Swiech V, RN  Safety Interventions:   environmental modification   fall reduction program maintained   low bed  Taken 11/21/2023 1000 by Joie Narrow, Garrison Michie V, RN  Safety Interventions:   environmental modification   fall reduction program maintained   low bed

## 2023-11-22 LAB — BASIC METABOLIC PANEL
ANION GAP: 10 mmol/L (ref 5–14)
BLOOD UREA NITROGEN: 5 mg/dL — ABNORMAL LOW (ref 9–23)
BUN / CREAT RATIO: 5
CALCIUM: 8.4 mg/dL — ABNORMAL LOW (ref 8.7–10.4)
CHLORIDE: 114 mmol/L — ABNORMAL HIGH (ref 98–107)
CO2: 23 mmol/L (ref 20.0–31.0)
CREATININE: 0.98 mg/dL (ref 0.73–1.18)
EGFR CKD-EPI (2021) MALE: >90 mL/min/1.73m2 (ref >=60)
GLUCOSE RANDOM: 88 mg/dL (ref 70–179)
POTASSIUM: 3.4 mmol/L (ref 3.4–4.8)
SODIUM: 147 mmol/L — ABNORMAL HIGH (ref 135–145)

## 2023-11-22 LAB — CBC
HEMATOCRIT: 40 % (ref 39.0–48.0)
HEMOGLOBIN: 12.9 g/dL (ref 12.9–16.5)
MEAN CORPUSCULAR HEMOGLOBIN CONC: 32.4 g/dL (ref 32.0–36.0)
MEAN CORPUSCULAR HEMOGLOBIN: 26.6 pg (ref 25.9–32.4)
MEAN CORPUSCULAR VOLUME: 82.3 fL (ref 77.6–95.7)
MEAN PLATELET VOLUME: 7.9 fL (ref 6.8–10.7)
PLATELET COUNT: 203 10*9/L (ref 150–450)
RED BLOOD CELL COUNT: 4.86 10*12/L (ref 4.26–5.60)
RED CELL DISTRIBUTION WIDTH: 14.8 % (ref 12.2–15.2)
WBC ADJUSTED: 4.2 10*9/L (ref 3.6–11.2)

## 2023-11-22 LAB — MAGNESIUM: MAGNESIUM: 1.6 mg/dL (ref 1.6–2.6)

## 2023-11-22 LAB — PHOSPHORUS: PHOSPHORUS: 2.3 mg/dL — ABNORMAL LOW (ref 2.4–5.1)

## 2023-11-22 MED ADMIN — cholecalciferol (vitamin D3 25 mcg (1,000 units)) tablet 25 mcg: 25 ug | ORAL | @ 13:00:00 | Stop: 2023-11-22

## 2023-11-22 MED ADMIN — nicotine (NICODERM CQ) 7 mg/24 hr patch 1 patch: 1 | TRANSDERMAL

## 2023-11-22 MED ADMIN — cariprazine (VRAYLAR) capsule 1.5 mg: 1.5 mg | ORAL | @ 13:00:00 | Stop: 2023-11-22

## 2023-11-22 MED ADMIN — prochlorperazine (COMPAZINE) injection 5 mg: 5 mg | INTRAVENOUS | @ 09:00:00 | Stop: 2023-11-22

## 2023-11-22 MED ADMIN — fluticasone furoate-vilanterol (BREO ELLIPTA) 100-25 mcg/dose inhaler 1 puff: 1 | RESPIRATORY_TRACT | @ 13:00:00 | Stop: 2023-11-22

## 2023-11-22 MED ADMIN — oxyCODONE (ROXICODONE) immediate release tablet 5 mg: 5 mg | ORAL | @ 09:00:00 | Stop: 2023-11-22

## 2023-11-22 MED ADMIN — QUEtiapine (SEROQUEL) tablet 100 mg: 100 mg | ORAL

## 2023-11-22 MED ADMIN — nicotine (NICODERM CQ) 7 mg/24 hr patch 1 patch: 1 | TRANSDERMAL | @ 13:00:00 | Stop: 2023-11-22

## 2023-11-22 MED ADMIN — prochlorperazine (COMPAZINE) injection 5 mg: 5 mg | INTRAVENOUS

## 2023-11-22 MED ADMIN — cyanocobalamin (vitamin B-12) tablet 1,000 mcg: 1000 ug | ORAL | @ 13:00:00 | Stop: 2023-11-22

## 2023-11-22 MED ADMIN — pantoprazole (Protonix) EC tablet 40 mg: 40 mg | ORAL | @ 13:00:00 | Stop: 2023-11-22

## 2023-11-22 NOTE — Unmapped (Signed)
 Physician Discharge Summary [facility]  [address] [facility] OBSERVATION [facility]  [address]  [place] [address]  Dept: [phone]  Loc: [phone]     Identifying Information:   Jerome Kennedy  28-Sep-1975  [id number]    Primary Care Physician: Francois Isaacs, MD   Code Status: Full Code    Admit Date: 11/20/2023    Discharge Date: 11/22/2023     Discharge To: Against Medical Advice    Discharge Service: [facility]     Discharge Attending Physician: No att. providers found    Discharge Diagnoses:  Active Problems:    * No active hospital problems. *  Resolved Problems:    * No resolved hospital problems. *      Outpatient Provider Follow Up Issues:   - would consider endoscopy to evaluate for flare and reassess/restage Crohn's    Hospital Course:   48yoM w/ Crohn's disease with prior hemicolectomy and hernia, admitted with possible Crohn's flare. GI consulted, recommended endoscopic evaluation. His diarrhea stopped while hospitalized. He declined to complete bowel prep. After discussing risks/benefits of medications he requested in s/o IBD and visceral pain (IV opiates including dilaudid  + IV phenergan ) he said he did not wish to remain hospitalized and did not wish to reassess or modify strategy for prep for colonoscopy. He requested to leave before medically advised. He understood risks/benefits of ongoing care vs leaving, but adamantly asserted that he wished to leave. He was counseled to return promptly to medical attention especially for any new or worsening symptoms, and to check in asap with his outpatient GI, which he said he would.    Procedures:  None  No admission procedures for hospital encounter.  ______________________________________________________________________  Discharge Medications:     Your Medication List        CONTINUE taking these medications      cholecalciferol  (vitamin D3 25 mcg (1,000 units)) 1,000 unit (25 mcg) tablet  Take 1 tablet (25 mcg total) by mouth daily. dicyclomine  10 mg capsule  Commonly known as: BENTYL   Take 1 capsule (10 mg total) by mouth Four (4) times a day (before meals and nightly).     empty container Misc  Use as directed     FLONASE  ALLERGY RELIEF 50 mcg/actuation nasal spray  Generic drug: fluticasone  propionate  1 spray into each nostril daily.     HUMIRA (CF) PEN 40 mg/0.4 mL injection  Generic drug: adalimumab   Inject the contents of 1 pen (40 mg total) under the skin every seven (7) days.     OPTICHAMBER DIAMOND [facility] Spcr  Generic drug: inhalational spacing device     pantoprazole  40 MG tablet  Commonly known as: Protonix   Take 1 tablet (40 mg total) by mouth daily.     QUEtiapine  100 MG tablet  Commonly known as: SEROQUEL   Take 1 tablet (100 mg total) by mouth nightly.     SYMBICORT  80-4.5 mcg/actuation inhaler  Generic drug: budesonide -formoterol   Inhale 2 puffs  in the morning and 2 puffs in the evening.     VENTOLIN  HFA 90 mcg/actuation inhaler  Generic drug: albuterol   Inhale 1 puff every six (6) hours as needed.     vitamin B-12 1000 MCG tablet  Generic drug: cyanocobalamin  (vitamin B-12)  Take 1 tablet (1,000 mcg total) by mouth daily.     VRAYLAR  1.5 mg capsule  Generic drug: cariprazine   Take 1 capsule (1.5 mg total) by mouth daily.  Allergies:  Morphine  and Toradol [ketorolac]  ______________________________________________________________________  Pending Test Results (if blank, then none):  Pending Labs       Order Current Status    Calprotectin, Stool In process    GI Pathogen Panel In process            Most Recent Labs:  CBC - Results in Past 30 Days  Result Component Current Result Ref Range Previous Result Ref Range   HCT 40.0 (11/22/2023) 39.0 - 48.0 % 40.5 (11/21/2023) 39.0 - 48.0 %   HGB 12.9 (11/22/2023) 12.9 - 16.5 g/dL 46.9 (01/31/5283) 13.2 - 16.5 g/dL   MCH 44.0 (08/06/7251) 25.9 - 32.4 pg 26.6 (11/21/2023) 25.9 - 32.4 pg   MCHC 32.4 (11/22/2023) 32.0 - 36.0 g/dL 66.4 (11/04/4740) 59.5 - 36.0 g/dL   MCV 63.8 (7/56/4332) 77.6 - 95.7 fL 83.1 (11/21/2023) 77.6 - 95.7 fL   MPV 7.9 (11/22/2023) 6.8 - 10.7 fL 7.8 (11/21/2023) 6.8 - 10.7 fL   Platelet 203 (11/22/2023) 150 - 450 10*9/L 217 (11/21/2023) 150 - 450 10*9/L   RBC 4.86 (11/22/2023) 4.26 - 5.60 10*12/L 4.88 (11/21/2023) 4.26 - 5.60 10*12/L   WBC 4.2 (11/22/2023) 3.6 - 11.2 10*9/L 6.4 (11/21/2023) 3.6 - 11.2 10*9/L     BMP - Results in Past 30 Days  Result Component Current Result Ref Range Previous Result Ref Range   BUN 5 (L) (11/22/2023) 9 - 23 mg/dL 11 (9/51/8841) 9 - 23 mg/dL   Chloride 660 (H) (02/01/1600) 98 - 107 mmol/L 114 (H) (11/21/2023) 98 - 107 mmol/L   CO2 23.0 (11/22/2023) 20.0 - 31.0 mmol/L 21.0 (11/21/2023) 20.0 - 31.0 mmol/L   Creatinine 0.98 (11/22/2023) 0.73 - 1.18 mg/dL 0.93 (2/35/5732) 2.02 - 1.18 mg/dL   Glucose 88 (5/42/7062) 70 - 179 mg/dL 86 (3/76/2831) 70 - 517 mg/dL   Potassium 3.4 (01/18/736) 3.4 - 4.8 mmol/L 3.3 (L) (11/21/2023) 3.4 - 4.8 mmol/L   Sodium 147 (H) (11/22/2023) 135 - 145 mmol/L 146 (H) (11/21/2023) 135 - 145 mmol/L     LFT's - Results in Past 30 Days  Result Component Current Result Ref Range Previous Result Ref Range   Albumin 3.4 (11/21/2023) 3.4 - 5.0 g/dL 2.9 (L) (08/09/2692) 3.4 - 5.0 g/dL   Alkaline Phosphatase 158 (H) (11/21/2023) 46 - 116 U/L 160 (H) (11/16/2023) 46 - 116 U/L   ALT 13 (11/21/2023) 10 - 49 U/L 31 (11/16/2023) 10 - 49 U/L   AST 16 (11/21/2023) <=34 U/L 25 (11/16/2023) <=34 U/L   Total Bilirubin 0.6 (11/21/2023) 0.3 - 1.2 mg/dL 0.5 (8/54/6270) 0.3 - 1.2 mg/dL       Relevant Studies/Radiology (if blank, then none):  CT Abdomen Pelvis W IV Contrast Only  Result Date: 11/21/2023  EXAM: CT ABDOMEN PELVIS W CONTRAST ACCESSION: [id number] UN REPORT DATE: 11/21/2023 4:55 AM CLINICAL INDICATION: 48 years old with crohns abdominal pain vomiting not tolerating PO  COMPARISON: CT abdomen pelvis 11/15/2023 TECHNIQUE: A helical CT scan of the abdomen and pelvis was obtained following IV contrast from the lung bases through the pubic symphysis. Images were reconstructed in the axial plane. Coronal and sagittal reformatted images were also provided for further evaluation. FINDINGS: LOWER CHEST: Bilateral dependent atelectasis. LIVER: Normal liver contour.  No focal liver lesions. BILIARY: The gallbladder is surgically absent. No biliary ductal dilatation.  SPLEEN: Normal in size and contour. PANCREAS: Normal pancreatic contour.  No focal lesions.  No ductal dilation. ADRENAL GLANDS: Normal appearance of the adrenal glands. KIDNEYS/URETERS: Symmetric renal enhancement.  No hydronephrosis. Subcentimeter  hypoattenuating lesions bilaterally are too small for characterization. BLADDER: Layering hyperdensity within the bladder lumen, similar to prior. REPRODUCTIVE ORGANS: Unremarkable. GI TRACT: Normal appearance of the stomach. No evidence of bowel obstruction. Similar postsurgical changes of right hemicolectomy and ileocecectomy with ileocolic anastomosis. The colon is similarly apposed to the ventral abdominal wall where the rectus is diastatic, likely due to adhesions. There is new focal bowel wall thickening and hyperenhancement involving large bowel within the right lower quadrant (2:85) as well as within the region of the sigmoid colon (2:102-106). There is gas and fluid-filled distention of the distal sigmoid colon and rectum which is mildly increased from prior. PERITONEUM, RETROPERITONEUM AND MESENTERY: No free air.  No ascites.  No fluid collection. LYMPH NODES: Upper abdominal mesenteric prominent lymph nodes appear similar to prior. VESSELS: Hepatic and portal veins are patent.  Normal caliber aorta. Similar extensive abdominal wall collaterals. BONES and SOFT TISSUES: Similar multilevel degenerative change of the spine. Trace retrolisthesis of L4-L5. Rectus diastasis and atrophy of the right rectus abdominis muscle. Mild body wall edema. Unfused right transverse ossification center of L1. Healing fractures of the anterior right seventh, eighth, and ninth ribs. Crescentic heterogenous sclerosis of the right femoral head without evidence of articular surface collapse.     Compared to CT abdomen pelvis 11/15/2023: -New multifocal bowel wall thickening and hyperenhancement within the right lower quadrant large bowel and the sigmoid colon suspicious for colitis, likely secondary to Crohn's disease. -Mildly increased gas and fluid-filled distention of the distal sigmoid colon and rectum which can be seen in a diarrheal state. 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    ______________________________________________________________________  Discharge Instructions:                 Appointments which have been scheduled for you      Dec 14, 2023  COLONOSCOPY, FLEXIBLE, PROXIMAL TO SPLENIC FLEXURE; DIAGNOSTIC, W/WO COLLECTION SPECIMEN BY BRUSH OR WASH with Rana Burr, MD  GI [facility] [address] [facility] [place]) [address]  [address]  [place] [address]  [phone]   None          ______________________________________________________________________  Discharge Day Services:  BP 104/73  - Pulse 69  - Temp 36.7 ??C (98.1 ??F) (Oral)  - Resp 18  - Ht 172.7 cm (5' 7.99)  - Wt 68 kg (150 lb)  - SpO2 98%  - BMI 22.81 kg/m??   GEN: alert, NAD  HEENT: mmm, no scleral icterus  LYMPH: no LAD in head/neck  CV: +s1/s2, nrrr, no m/r/g   PULM: ctab  ABD: +bs, soft, minimally if at all tender, +stable-appearing, reducible hernia  EXT: wwp, no edema  NEURO: ma4e, no focal motor deficit  PSYCH: answers questions appropriately, thought linear    Condition at Discharge:  left in fair and stable condition, AMA    Length of Discharge: I spent greater than 55 minutes in the care of the patient on the day of discharge, including history and exam, review of records, coordination of care (including with GI consultants), counseling of the patient.

## 2023-11-22 NOTE — Unmapped (Addendum)
 Pt is A&Ox4 with vss. Pt given PRN compazine  with good effect. Pt is still drinking bowel prep and is still not clear. Will continue to monitor.   Problem: Adult Inpatient Plan of Care  Goal: Plan of Care Review  Outcome: Ongoing - Unchanged  Goal: Patient-Specific Goal (Individualized)  Outcome: Ongoing - Unchanged  Goal: Absence of Hospital-Acquired Illness or Injury  Outcome: Ongoing - Unchanged  Intervention: Identify and Manage Fall Risk  Recent Flowsheet Documentation  Taken 11/22/2023 0000 by Detra Flower, RN  Safety Interventions:   fall reduction program maintained   lighting adjusted for tasks/safety   low bed  Taken 11/21/2023 2200 by Detra Flower, RN  Safety Interventions:   fall reduction program maintained   lighting adjusted for tasks/safety   low bed  Taken 11/21/2023 2000 by Detra Flower, RN  Safety Interventions:   fall reduction program maintained   lighting adjusted for tasks/safety   low bed   isolation precautions  Intervention: Prevent Skin Injury  Recent Flowsheet Documentation  Taken 11/22/2023 0000 by Detra Flower, RN  Positioning for Skin: Supine/Back  Taken 11/21/2023 2200 by Detra Flower, RN  Positioning for Skin: Supine/Back  Taken 11/21/2023 2000 by Detra Flower, RN  Positioning for Skin: Supine/Back  Skin Protection: adhesive use limited  Taken 11/21/2023 1945 by Detra Flower, RN  Positioning for Skin: Supine/Back  Skin Protection: incontinence pads utilized  Goal: Optimal Comfort and Wellbeing  Outcome: Ongoing - Unchanged  Goal: Readiness for Transition of Care  Outcome: Ongoing - Unchanged  Goal: Rounds/Family Conference  Outcome: Ongoing - Unchanged

## 2023-11-23 MED ORDER — SIMETHICONE 80 MG CHEWABLE TABLET
ORAL_TABLET | ORAL | 0 refills | 0.00 days | Status: CP
Start: 2023-11-23 — End: ?

## 2023-11-23 MED ORDER — POLYETHYLENE GLYCOL 3350 17 GRAM/DOSE ORAL POWDER
0 refills | 0.00 days | Status: CP
Start: 2023-11-23 — End: ?

## 2023-11-23 MED ORDER — BISACODYL 5 MG TABLET,DELAYED RELEASE
ORAL_TABLET | Freq: Once | ORAL | 0 refills | 1.00 days | Status: CP
Start: 2023-11-23 — End: 2023-11-24

## 2023-11-23 NOTE — Unmapped (Signed)
 Miralax  instructions and prep timeline mailed to patient, per Dr. Deetta Farrow.    Also faxed all of this information to pharmacy [facility]) and ordered Miralax  prep plus additional bottle for the week before to pharmacy, due to patient's insurance.     See letters. Patient will need to do one week of BID Miralax  and standard Miralax  prep, due to previous inadequate clean out.

## 2023-11-25 ENCOUNTER — Ambulatory Visit
Admit: 2023-11-25 | Discharge: 2023-12-02 | Disposition: A | Discharge status: Home / Self Care | Payer: Medicaid (Managed Care)

## 2023-11-25 ENCOUNTER — Encounter
Admit: 2023-11-25 | Discharge: 2023-12-02 | Disposition: A | Discharge status: Home / Self Care | Payer: Medicaid (Managed Care) | Attending: Student in an Organized Health Care Education/Training Program

## 2023-11-25 ENCOUNTER — Inpatient Hospital Stay
Admit: 2023-11-25 | Discharge: 2023-12-02 | Disposition: A | Discharge status: Home / Self Care | Payer: Medicaid (Managed Care)

## 2023-11-25 NOTE — Unmapped (Signed)
 [facility]  Emergency Department Provider Note     ED Clinical Impression     Final diagnoses:   Acute Crohn's disease with complication (Primary)        Impression, Medical Decision Making, ED Course     7:16 PM   Impression: 48 y.o. male with a past medical history of Crohn's, bipolar 1, COPD, and DVT who presents with abdominal pain as described below. Upon arrival, VS WNL.    DDx/MDM: Patient notes this presentation is consistent with prior Crohn???s flares. Denies melena, hematochezia, or worsening baseline diarrhea. He has a known abdominal wall hernia, which remains soft, non-tender, and reducible on exam. No signs of peritonitis or bowel obstruction. Notably, EMS reported a glucose in the 500s, but POCT glucose in ED was 137 without intervention; likely spurious reading. A1c sent to evaluate for possible undiagnosed diabetes, although patient denies prior history.  Of note patient has been seen and admitted to the hospital multiple times over the past few months for the same presentation.  He has left AMA multiple times before workup can be completed.  On exam and interview today I do not note any new or worsening symptoms compared to prior admission on 4/18 - 4/20.  With this being said I do not feel that he needs a new CT scan.    ED Course as of 11/25/23 2051   Wed Nov 25, 2023   2029 Discussed in detail with the patient about pain regimen expectations.  We agreed that we would give 1 dose of IV pain and nausea medication while in the ED, but the goal is to get the patient on an oral only pain regimen.  We also discussed patient's need of further workup inpatient as per previous admission notes.  He specifically agreed to stay for further workup including possible colonoscopy and/or endoscopy   2032 MAO paged for admit   2051 Patient signed out to oncoming provider       ____________________________________________    The case was discussed with the attending physician, who is in agreement with the above assessment and plan.      History     Chief Complaint  Chief Complaint   Patient presents with    Elevated Blood Glucose Symptomatic       HPI   Jerome Kennedy. is a 48 y.o. male with past medical history as below who presents with abdominal pain and N/V.    Patient reports he was at [employer] when he started getting some abdominal pain.  He went to the bathroom and started vomiting.  Reports there was small amount of bright red blood streaks in the emesis. He reports that this is similar to his previous episodes of abdominal pain and Crohn's disease flares.  Has had baseline diarrhea without worsening.  Denies blood in the stool or dark stools.  Per EMS he had a glucose in the 500s.  Per patient report he has not had diagnosis of diabetes in the past. He does have a known abdominal wall hernia that remains soft and reducible.    External Records Reviewed: I have reviewed previous ED visits, outpatient clinic notes, previous labs.    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    Myalgia     Rhinovirus infection 10/29/2022    SBO (small bowel obstruction) 2011       Past Surgical History:   Procedure Laterality Date    CHOLECYSTECTOMY      COLON SURGERY      partial resection    HERNIA REPAIR      PR ARTHRP INTERCARPAL/CARP/MTCRPL JT INTERPOSITION Left 08/07/2022    Procedure: INTERPOSIT ARTHROPLASTY-INTERCARPAL/CARPOMETACAR;  Surgeon: Adonna Hoots, MD;  Location: MAIN OR [facility];  Service: Orthopedics    PR CLOSE ENTEROSTOMY,RESEC+COLOREC ANAS Midline 11/01/2019    Procedure: CLO ENTEROSTOMY; W/RESECT Lethaniel Rave;  Surgeon: Eusebio High, MD;  Location: MAIN OR [place];  Service: Gastrointestinal    PR COLONOSCOPY FLX DX W/COLLJ SPEC WHEN PFRMD Left 01/07/2013    Procedure: COLONOSCOPY, FLEXIBLE, PROXIMAL TO SPLENIC FLEXURE; DIAGNOSTIC, W/WO COLLECTION SPECIMEN BY BRUSH OR WASH;  Surgeon: Lizbeth Right, MD;  Location: [facility]URES [facility];  Service: Gastroenterology    PR COLONOSCOPY FLX DX W/COLLJ SPEC WHEN PFRMD  03/09/2015    Procedure: COLONOSCOPY, FLEXIBLE, PROXIMAL TO SPLENIC FLEXURE; DIAGNOSTIC, W/WO COLLECTION SPECIMEN BY BRUSH OR WASH;  Surgeon: Delene Feinstein, MD;  Location: GI [facility]URES [facility];  Service: Gastroenterology    PR COLONOSCOPY FLX DX W/COLLJ SPEC WHEN PFRMD N/A 10/28/2019    Procedure: COLONOSCOPY, FLEXIBLE, PROXIMAL TO SPLENIC FLEXURE; DIAGNOSTIC, W/WO COLLECTION SPECIMEN BY BRUSH OR WASH;  Surgeon: Sarajane Cumming, MD;  Location: GI [facility]URES [facility];  Service: Gastroenterology    PR COLONOSCOPY FLX DX W/COLLJ SPEC WHEN PFRMD N/A 10/01/2022    Procedure: COLONOSCOPY, FLEXIBLE, PROXIMAL TO SPLENIC FLEXURE; DIAGNOSTIC, W/WO COLLECTION SPECIMEN BY BRUSH OR WASH;  Surgeon: Rana Burr, MD;  Location: GI [facility]URES [facility];  Service: Gastroenterology    PR EXPLORATORY OF ABDOMEN Midline 09/02/2019    Procedure: EXPLORATORY LAPAROTOMY, EXPLORATORY CELIOTOMY WITH OR WITHOUT BIOPSY(S);  Surgeon: Eusebio High, MD;  Location: MAIN OR [facility]  Service: Gastrointestinal    PR EXPLORATORY OF ABDOMEN Midline 11/01/2019    Procedure: EXPLORATORY LAPAROTOMY, EXPLORATORY CELIOTOMY WITH OR WITHOUT BIOPSY(S);  Surgeon: Eusebio High, MD;  Location: MAIN OR [facility];  Service: Gastrointestinal    PR FIX FINGER,VOLAR PLATE,I-P JT Left 08/07/2022    Procedure: REPAIR AND RECONSTRUCTION, FINGER, VOLAR PLATE, INTERPHALANGEAL JOINT;  Surgeon: Adonna Hoots, MD;  Location: MAIN OR [facility];  Service: Orthopedics    PR FREEING BOWEL ADHESION,ENTEROLYSIS N/A 11/01/2019 Procedure: Enterolysis ([facility]art Proc);  Surgeon: Eusebio High, MD;  Location: MAIN OR [facility];  Service: Gastrointestinal    PR FUSION MC-P JT Left 08/27/2023    Procedure: ARTHRODESIS, METACARPOPHALANGEAL JOINT, WITH OR WITHOUT INTERNAL FIXATION;  Surgeon: Adonna Hoots, MD;  Location: OR [facility];  Service: Orthopedics    PR ILEOSCOPY THRU STOMA,BIOPSY N/A 10/14/2019    Procedure: Erby Hatcher; Lamarr Pilling 1/MX;  Surgeon: Loel Ring, MD;  Location: GI [facility]URES [facility];  Service: Gastroenterology    PR PART REMOVAL COLON W COLOSTOMY Midline 09/02/2019    Procedure: COLECTOMY, PARTIAL; WITH SKIN LEVEL CECOSTOMY OR COLOSTOMY;  Surgeon: Eusebio High, MD;  Location: MAIN OR [facility];  Service: Gastrointestinal    PR REPAIR INCISIONAL HERNIA,REDUCIBLE Midline 09/02/2019    Procedure: REPAIR INIT INCISIONAL OR VENTRAL  HERNIA; REDUCIBLE;  Surgeon: Caro Christmas, MD;  Location: MAIN OR [place];  Service: Trauma    PR UPPER GI ENDOSCOPY,BIOPSY N/A 01/07/2013    Procedure: UGI ENDOSCOPY; WITH BIOPSY, SINGLE OR MULTIPLE;  Surgeon: Lizbeth Right, MD;  Location: [facility];  Service: Gastroenterology    PR UPPER GI ENDOSCOPY,BIOPSY N/A 10/14/2019    Procedure: UGI ENDOSCOPY; WITH BIOPSY, SINGLE OR MULTIPLE;  Surgeon: Loel Ring, MD;  [facility];  Service: Gastroenterology    PR UPPER GI ENDOSCOPY,DIAGNOSIS N/A 10/01/2022    Procedure: UGI ENDO, INCLUDE ESOPHAGUS, STOMACH, & DUODENUM &/OR JEJUNUM; DX W/WO COLLECTION SPECIMN, BY BRUSH OR WASH;  Surgeon: Rana Burr, MD;  [facility]: [facility];  Service: Gastroenterology         Current Facility-Administered Medications:     acetaminophen  (TYLENOL ) tablet 650 mg, 650 mg, Oral, Once, Cher Franzoni B, DO    HYDROmorphone  (PF) (DILAUDID ) injection Syrg 0.5 mg, 0.5 mg, Intravenous, Once, Irvan Tiedt B, DO    prochlorperazine  (COMPAZINE ) injection 5 mg, 5 mg, Intravenous, Once, Xana Bradt B, DO    Current Outpatient Medications:     cariprazine  (VRAYLAR ) 1.5 mg capsule, Take 1 capsule (1.5 mg total) by mouth daily., Disp: , Rfl:     cholecalciferol , vitamin D3 25 mcg, 1,000 units,, 1,000 unit (25 mcg) tablet, Take 1 tablet (25 mcg total) by mouth daily., Disp: 30 tablet, Rfl: 0    cyanocobalamin , vitamin B-12, (VITAMIN B-12) 1000 MCG tablet, Take 1 tablet (1,000 mcg total) by mouth daily., Disp: , Rfl:     dicyclomine  (BENTYL ) 10 mg capsule, Take 1 capsule (10 mg total) by mouth Four (4) times a day (before meals and nightly)., Disp: 120 capsule, Rfl: 3    empty container Misc, Use as directed, Disp: 1 each, Rfl: 3    FLONASE  ALLERGY RELIEF 50 mcg/actuation nasal spray, 1 spray into each nostril daily., Disp: , Rfl:     HUMIRA  PEN CITRATE FREE 40 MG/0.4 ML, Inject the contents of 1 pen (40 mg total) under the skin every seven (7) days., Disp: 4 each, Rfl: 5    OPTICHAMBER DIAMOND VHC Spcr, , Disp: , Rfl:     pantoprazole  (PROTONIX ) 40 MG tablet, Take 1 tablet (40 mg total) by mouth daily., Disp: 90 tablet, Rfl: 0    polyethylene glycol (GLYCOLAX ) 17 gram/dose powder, Take as directed by Colonoscopy Prep Instructions., Disp: 238 g, Rfl: 0    polyethylene glycol (MIRALAX ) 17 gram/dose powder, Take as directed for split bowel prep., Disp: 119 g, Rfl: 0    polyethylene glycol (CLEARLAX) 17 gram/dose powder, Take as directed for split prep., Disp: 119 g, Rfl: 0    QUEtiapine  (SEROQUEL ) 100 MG tablet, Take 1 tablet (100 mg total) by mouth nightly., Disp: , Rfl:     simethicone  (MYLICON) 80 MG chewable tablet, Chew 3 tablets (240 mg total) Take as directed. Take 3 tablets (240 mg total) with prep as directed., Disp: 100 tablet, Rfl: 0    SYMBICORT  80-4.5 mcg/actuation inhaler, Inhale 2 puffs  in the morning and 2 puffs in the evening., Disp: , Rfl:     VENTOLIN  HFA 90 mcg/actuation inhaler, Inhale 1 puff every six (6) hours as needed., Disp: , Rfl:     Allergies  Morphine  and Toradol [ketorolac]    Family History  Family History   Problem Relation Age of Onset    Breast cancer Mother     Diabetes Maternal Aunt  Colon cancer Maternal Grandmother     Prostate cancer Maternal Grandfather     Cancer Paternal Grandmother     Crohn's disease Neg Hx        Social History  Social History     Tobacco Use    Smoking status: Every Day     Current packs/day: 0.30     Average packs/day: 0.5 packs/day for 22.1 years (10.5 ttl pk-yrs)     Types: Cigarettes     Start date: 10/02/2001     Passive exposure: Current    Smokeless tobacco: Never    Tobacco comments:     Reduced use from 10cpd to 5cpd, though relights each one. Motivated to quit.    Vaping Use    Vaping status: Some Days    Substances: Nicotine     Passive vaping exposure: Yes   Substance Use Topics    Alcohol use: No     Alcohol/week: 0.0 standard drinks of alcohol    Drug use: Yes     Types: Marijuana     Comment: Smokes 2-3 joints/day        Physical Exam     VITAL SIGNS:      Vitals:    11/25/23 1829 11/25/23 1842   BP:  121/78   Pulse: 81 79   Resp:  16   Temp:  37.2 ??C (99 ??F)   TempSrc:  Oral   SpO2: 95% 98%       Constitutional: Alert and oriented. No acute distress.  Eyes: Conjunctivae are normal.  HEENT: Normocephalic and atraumatic. Conjunctivae clear. No congestion. Moist mucous membranes.   Cardiovascular: Rate as above, regular rhythm. Normal and symmetric distal pulses. Brisk capillary refill. Normal skin turgor.  Respiratory: Normal respiratory effort. Breath sounds are normal. There are no wheezing or crackles heard.  Gastrointestinal: Soft, non-distended. + Periumbilical abdominal hernia, reducible.  Diffuse abdominal tenderness most prominent in RLQ.  No rebound tenderness.  Genitourinary: Deferred.  Musculoskeletal: Non-tender with normal range of motion in all extremities.  Neurologic: Normal speech and language. No gross focal neurologic deficits are appreciated. Patient is moving all extremities equally, face is symmetric at rest and with speech.  Skin: Skin is warm, dry and intact. No rash noted.  Psychiatric: Mood and affect are normal. Speech and behavior are normal.     Radiology     No orders to display       Pertinent labs & imaging results that were available during my care of the patient were independently interpreted by me and considered in my medical decision making (see chart for details).    Portions of this record have been created using Scientist, clinical (histocompatibility and immunogenetics). Dictation errors have been sought, but may not have been identified and corrected.         Sherrlyn Dolores, DO  Resident  11/25/23 2051

## 2023-11-25 NOTE — Unmapped (Signed)
 [facility] has made a second and final attempt to reach this patient to refill the following medication:Humira .      We have been unable to leave messages on the following phone numbers: [phone] and have sent a text message to the following phone numbers: [phone] .    Dates contacted: 4/18 & 4/23  Last scheduled delivery: 10/26/23 (28 day supply)    The patient may be at risk of non-compliance with this medication. The patient should call the [facility] at [phone]  Option 4, then Option 2: Dermatology, Gastroenterology, Rheumatology to refill medication.    Canary Ceo   [facility] Specialty Technician

## 2023-11-25 NOTE — Unmapped (Signed)
 ED Progress Note    Received signout from outgoing provider. This is a 48 yo M w/ PMHx Crohn's disease with prior hemicolectomy/hernia, recent admission for Crohn's flare, had left AMA. Presenting now 2 days later for abdominal pain.    ED Course as of 11/27/23 1229   Wed Nov 25, 2023   2117 Notified by nurse that there is no IV access right now. Refusing to be stuck unless it is an ultrasound guided PIV, but no nurses are available right now to do this.    2155 Discussed with pt, amenable to trying to find access without ultrasound. Have made primary RN aware.   2238 Primary RN attempted US  guided PIV- was successful but pt kept shaking throughout and the PIV blew out. Charge RN made aware.   2330 Successful US  guided PIV placement. Accepted by medicine for admission.        Elsworth Halt, MD  Internal Medicine PGY-2

## 2023-11-25 NOTE — Unmapped (Signed)
Bed: 19-B  Expected date:   Expected time:   Means of arrival:   Comments:  EMS

## 2023-11-25 NOTE — Unmapped (Signed)
 Attempt x 1 to place PIV. Pt had refused prior to this attempt, insisting on US  placed PIV. PIV attempt unsuccessful

## 2023-11-25 NOTE — Unmapped (Signed)
 Received sign out from previous provider. For further history please see their note.        IPASS summary and updates:  ED Course as of 11/25/23 1951   Sat Nov 21, 2023   0520 CT Abdomen Pelvis W IV Contrast Only  IMPRESSION:  Compared to CT abdomen pelvis 11/15/2023:  -New multifocal bowel wall thickening and hyperenhancement within the right lower quadrant large bowel and the sigmoid colon suspicious for colitis, likely secondary to Crohn's disease.  -Mildly increased gas and fluid-filled distention of the distal sigmoid colon and rectum which can be seen in a diarrheal state.  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     0524 Hi, I'd like to admit Jerome Blyden Jr. MRN [id number] in the ED for Crohn's flare, recent admission left AMA for family emergency, unable to tolerate p.o. at this time. Thanks, Tigerlily Christine    Paged MAO                 Final diagnoses:   Abdominal pain, unspecified abdominal location (Primary)       Vitals:    11/22/23 0012 11/22/23 0019 11/22/23 0701 11/22/23 0732   BP:  112/73  104/73   Pulse: 65 70 66 69   Resp:  18  18   Temp:  36.3 ??C (97.3 ??F)  36.7 ??C (98.1 ??F)   TempSrc:  Oral  Oral   SpO2:  98%  98%   Weight:       Height:           CT Abdomen Pelvis W IV Contrast Only   Final Result   Compared to CT abdomen pelvis 11/15/2023:   -New multifocal bowel wall thickening and hyperenhancement within the right lower quadrant large bowel and the sigmoid colon suspicious for colitis, likely secondary to Crohn's disease.   -Mildly increased gas and fluid-filled distention of the distal sigmoid colon and rectum which can be seen in a diarrheal state.   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             Lab Results   Component Value Date    WBC 4.2 11/22/2023    HGB 12.9 11/22/2023    HCT 40.0 11/22/2023    PLT 203 11/22/2023       Lab Results   Component Value Date    NA 147 (H) 11/22/2023    K 3.4 11/22/2023    CL 114 (H) 11/22/2023    CO2 23.0 11/22/2023    BUN 5 (L) 11/22/2023    CREATININE 0.98 11/22/2023    GLU 88 11/22/2023    CALCIUM  8.4 (L) 11/22/2023    MG 1.6 11/22/2023    PHOS 2.3 (L) 11/22/2023       Lab Results   Component Value Date    BILITOT 0.6 11/21/2023    BILIDIR 0.20 04/26/2023    PROT 6.5 11/21/2023    ALBUMIN 3.4 11/21/2023    ALT 13 11/21/2023    AST 16 11/21/2023    ALKPHOS 158 (H) 11/21/2023    GGT 57 01/05/2013       Lab Results   Component Value Date    LABPROT 12.0 01/08/2013    INR 0.99 10/18/2023    APTT 22.4 (L) 10/18/2023

## 2023-11-25 NOTE — Unmapped (Signed)
 Internal Medicine (MEDW) History & Physical    Assessment & Plan:   Eason Housman. is a 48 y.o. male whose presentation is complicated by Crohn's disease, hemicolectomy, COPD, bipolar disorder that presented to [facility] with abdominal discomfort, nausea and vomiting, and diarrhea concerning for Crohn's flare.  Of note he has had multiple recent AMA discharges for the same, in part due to family emergency which required his care.    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  He also has a ventral abdominal hernia and incisional hernia s/p repair in 2004.  He has also had recurrent C. difficile infection.  He is currently on treatment with Humira  weekly.  - He presents with abdominal pain, nausea and vomiting, and diarrhea.  He reports his symptoms have not changed since last discharge.  Just discharged from [facility] on 4/20 after admission for the same.  He was seen by GI during that admission and they were planning for colonoscopy at that time.  Of note he will need a 2-day bowel prep given that the most recent colonoscopies in February 2020 for in March 2021 were reported and they did not reach his ileocolonic anastomosis as it was complicated by poor prep and colonic anatomy.  CT during that admission with new multifocal bowel wall thickening and enhancement in the right lower quadrant suspicious for colitis  - GI consult in the a.m.  - Follow-up admission labs, ESR/CRP, fecal calprotectin  - GI PP and C. difficile negative within the last week and do not feel this needs to be repeated  - Clear liquid diet with anticipation of likely starting prep for colonoscopy tomorrow  - Antiemetics  - Tylenol  and oxycodone  available  - IV Dilaudid  only if unable to tolerate p.o./vomiting    Chronic Problems    Bipolar disorder: Continue Vraylar  and Seroquel    COPD: Continue home inhaler   GERD: Continue home PPI    The patient's presentation is complicated by the following clinically significant conditions requiring additional evaluation and treatment: - Hypercoagulable state requiring additional attention to DVT prophylaxis and treatment or chronic anticoagulation  - Volume depletion POA requiring further investigation, treatment, or monitoring    Issues Impacting Complexity of Management:  -Parenteral controlled medications: IV Dilaudid     Medical Decision Making: Reviewed records from the following unique sources per hospitalization.    Checklist:  Diet: Clear Liquid Diet  DVT PPx: Lovenox  40mg  q24h  Code Status: Full Code  Dispo: Patient appropriate for Inpatient based on expectation of ongoing need for hospitalization greater than two midnights based on severity of presentation/services including multiday prep for colonoscopy    Team Contact Information:   Primary Team: Internal Medicine (MEDW)  Primary Resident: Miachel Adams, MD  Resident's Pager: [phone] [facility])    Chief Concern:   Exacerbation of Crohn's disease    Subjective:   Praveen Kreager Jr. is a 48 y.o. male with pertinent  PMHx of Crohn's disease, hemicolectomy, COPD, bipolar disorder that presented to [facility] with abdominal discomfort, nausea and vomiting, and diarrhea concerning for Crohn's flare.  Of note he has had multiple recent AMA discharges for the same, in part due to family emergency which required his care.    History obtained by myself.  Provided by the patient.    HPI:    Patient presents via EMS.  They picked him up at [facility].  He was having abdominal pain nausea and vomiting and diarrhea.  He reports the symptoms are the same which brought him to the hospital last time.  He reports no changes in his symptoms.  He denies fever or chills.  He denies hematochezia or melena.  He has been unable to keep down much solid food but did have some grits and eggs this morning.  Still keeping down his medications at home intermittently.  Patient was just admitted last week with the same symptoms and there was plan for 2-day prep with colonoscopy.  His diarrhea stopped while he was hospitalized and he declined to complete bowel prep.  He ultimately left AGAINST MEDICAL ADVICE.    Workup in the ED has been limited as labs are pending, but patient is hemodynamically stable though tired appearing and obviously uncomfortable secondary to abdominal pain.  Exam reassuring and given CT scan last week repeat scan has been deferred.    Patient desires admission for colonoscopy prep and colonoscopy.  Is motivated and in agreement with plan of care.    Allergies  Morphine  and Toradol [ketorolac]    I reviewed the Medication List. The current list is Accurate  Prior to Admission medications    Medication Dose, Route, Frequency   cariprazine  (VRAYLAR ) 1.5 mg capsule 1.5 mg, Oral, Daily (standard)   cholecalciferol , vitamin D3 25 mcg, 1,000 units,, 1,000 unit (25 mcg) tablet 25 mcg, Oral, Daily (standard)   cyanocobalamin , vitamin B-12, (VITAMIN B-12) 1000 MCG tablet 1,000 mcg, Oral, Daily (standard)   dicyclomine  (BENTYL ) 10 mg capsule 10 mg, Oral, 4 times a day (ACHS)   empty container Misc Use as directed   ferrous sulfate  325 (65 FE) MG tablet 325 mg, Oral, Daily   FLONASE  ALLERGY RELIEF 50 mcg/actuation nasal spray 1 spray, Daily (standard)   HUMIRA  PEN CITRATE FREE 40 MG/0.4 ML Inject the contents of 1 pen (40 mg total) under the skin every seven (7) days.   OPTICHAMBER DIAMOND [facility] Spcr    pantoprazole  (PROTONIX ) 40 MG tablet 40 mg, Oral, Daily (standard)   polyethylene glycol (GLYCOLAX ) 17 gram/dose powder Take as directed by Colonoscopy Prep Instructions.   polyethylene glycol (MIRALAX ) 17 gram/dose powder Take as directed for split bowel prep.   polyethylene glycol (CLEARLAX) 17 gram/dose powder Take as directed for split prep.   QUEtiapine  (SEROQUEL ) 100 MG tablet Take 1 tablet (100 mg total) by mouth nightly.   simethicone  (MYLICON) 80 MG chewable tablet 250 mg, Oral, Take as directed, Take 3 tablets (240 mg total) with prep as directed.   SYMBICORT  80-4.5 mcg/actuation inhaler 2 puffs, 2 times daily (RT)   VENTOLIN  HFA 90 mcg/actuation inhaler 1 puff, Every 6 hours PRN       Librarian, academic:  Mr. Wahlen currently has decisional capacity for healthcare decision-making and is able to designate a surrogate healthcare decision maker. Mr. Ortez designated healthcare decision maker(s) is/are Wilburn Keir (the patient's adult sibling) as denoted by stated patient preference.    Objective:   Physical Exam:  Temp:  [37.2 ??C (99 ??F)] 37.2 ??C (99 ??F)  Pulse:  [79-81] 79  Resp:  [16] 16  BP: (121)/(78) 121/78  SpO2:  [95 %-98 %] 98 %    Gen: NAD, converses appropriately, but uncomfortable appearing  Eyes: Sclera anicteric, EOMI grossly normal   HENT: Atraumatic, normocephalic  Neck: Trachea midline  Heart: RRR  Lungs: CTAB, no crackles or wheezes  Abdomen: Soft, slightly tender palpation, hernia present but reducible, no rebound tenderness  Extremities: No edema  Neuro: Grossly symmetric, non-focal    Skin:  No rashes, lesions on clothed exam  Psych: Alert, oriented

## 2023-11-25 NOTE — Unmapped (Signed)
 BIB [facility] from [facility] for abd pain w/ nausea. Found to have BGL 504, no hx diabetes.

## 2023-11-25 NOTE — Unmapped (Signed)
 Pt arrives w [facility] d/t n/v RLQ abdominal pain. Pt reports he's been having nausea and vomiting all day. Pt CBG 504 w EMS. Denies hx of diabetes. Denies increased thirst. Pt received 4 of zofran  and 50 of fentanyl . Pt alert and oriented. Answering questions appropriately.

## 2023-11-26 LAB — CBC W/ AUTO DIFF
BASOPHILS ABSOLUTE COUNT: 0 10*9/L (ref 0.0–0.1)
BASOPHILS ABSOLUTE COUNT: 0 10*9/L (ref 0.0–0.1)
BASOPHILS RELATIVE PERCENT: 0.7 %
BASOPHILS RELATIVE PERCENT: 0.5 %
EOSINOPHILS ABSOLUTE COUNT: 0.2 10*9/L (ref 0.0–0.5)
EOSINOPHILS ABSOLUTE COUNT: 0.2 10*9/L (ref 0.0–0.5)
EOSINOPHILS RELATIVE PERCENT: 2.7 %
EOSINOPHILS RELATIVE PERCENT: 2.6 %
HEMATOCRIT: 37.3 % — ABNORMAL LOW (ref 39.0–48.0)
HEMATOCRIT: 36.8 % — ABNORMAL LOW (ref 39.0–48.0)
HEMOGLOBIN: 12.1 g/dL — ABNORMAL LOW (ref 12.9–16.5)
HEMOGLOBIN: 11.9 g/dL — ABNORMAL LOW (ref 12.9–16.5)
LYMPHOCYTES ABSOLUTE COUNT: 1.9 10*9/L (ref 1.1–3.6)
LYMPHOCYTES ABSOLUTE COUNT: 2.4 10*9/L (ref 1.1–3.6)
LYMPHOCYTES RELATIVE PERCENT: 32.5 %
LYMPHOCYTES RELATIVE PERCENT: 40.3 %
MEAN CORPUSCULAR HEMOGLOBIN CONC: 32.4 g/dL (ref 32.0–36.0)
MEAN CORPUSCULAR HEMOGLOBIN CONC: 32.3 g/dL (ref 32.0–36.0)
MEAN CORPUSCULAR HEMOGLOBIN: 26.6 pg (ref 25.9–32.4)
MEAN CORPUSCULAR HEMOGLOBIN: 26.8 pg (ref 25.9–32.4)
MEAN CORPUSCULAR VOLUME: 82.1 fL (ref 77.6–95.7)
MEAN CORPUSCULAR VOLUME: 82.8 fL (ref 77.6–95.7)
MEAN PLATELET VOLUME: 7.9 fL (ref 6.8–10.7)
MEAN PLATELET VOLUME: 8.6 fL (ref 6.8–10.7)
MONOCYTES ABSOLUTE COUNT: 0.4 10*9/L (ref 0.3–0.8)
MONOCYTES ABSOLUTE COUNT: 0.4 10*9/L (ref 0.3–0.8)
MONOCYTES RELATIVE PERCENT: 6.7 %
MONOCYTES RELATIVE PERCENT: 7.1 %
NEUTROPHILS ABSOLUTE COUNT: 3.4 10*9/L (ref 1.8–7.8)
NEUTROPHILS ABSOLUTE COUNT: 2.9 10*9/L (ref 1.8–7.8)
NEUTROPHILS RELATIVE PERCENT: 57.4 %
NEUTROPHILS RELATIVE PERCENT: 49.5 %
PLATELET COUNT: 196 10*9/L (ref 150–450)
PLATELET COUNT: 209 10*9/L (ref 150–450)
RED BLOOD CELL COUNT: 4.54 10*12/L (ref 4.26–5.60)
RED BLOOD CELL COUNT: 4.44 10*12/L (ref 4.26–5.60)
RED CELL DISTRIBUTION WIDTH: 14.9 % (ref 12.2–15.2)
RED CELL DISTRIBUTION WIDTH: 15 % (ref 12.2–15.2)
WBC ADJUSTED: 5.9 10*9/L (ref 3.6–11.2)
WBC ADJUSTED: 5.8 10*9/L (ref 3.6–11.2)

## 2023-11-26 LAB — IRON PANEL
IRON SATURATION: 24 % (ref 20–55)
IRON: 78 ug/dL (ref 65–175)
TOTAL IRON BINDING CAPACITY: 326 ug/dL (ref 250–425)

## 2023-11-26 LAB — COMPREHENSIVE METABOLIC PANEL
ALBUMIN: 3 g/dL — ABNORMAL LOW (ref 3.4–5.0)
ALKALINE PHOSPHATASE: 128 U/L — ABNORMAL HIGH (ref 46–116)
ALT (SGPT): 11 U/L (ref 10–49)
ANION GAP: 9 mmol/L (ref 5–14)
AST (SGOT): 16 U/L (ref <=34)
BILIRUBIN TOTAL: 0.2 mg/dL — ABNORMAL LOW (ref 0.3–1.2)
BLOOD UREA NITROGEN: 11 mg/dL (ref 9–23)
BUN / CREAT RATIO: 9
CALCIUM: 8.5 mg/dL — ABNORMAL LOW (ref 8.7–10.4)
CHLORIDE: 110 mmol/L — ABNORMAL HIGH (ref 98–107)
CO2: 26 mmol/L (ref 20.0–31.0)
CREATININE: 1.27 mg/dL — ABNORMAL HIGH (ref 0.73–1.18)
EGFR CKD-EPI (2021) MALE: 70 mL/min/1.73m2 (ref >=60)
GLUCOSE RANDOM: 91 mg/dL (ref 70–179)
POTASSIUM: 3.8 mmol/L (ref 3.4–4.8)
PROTEIN TOTAL: 5.8 g/dL (ref 5.7–8.2)
SODIUM: 145 mmol/L (ref 135–145)

## 2023-11-26 LAB — URINALYSIS WITH MICROSCOPY
BACTERIA: NONE SEEN /HPF
BILIRUBIN UA: NEGATIVE
BLOOD UA: NEGATIVE
GLUCOSE UA: NEGATIVE
KETONES UA: NEGATIVE
LEUKOCYTE ESTERASE UA: NEGATIVE
NITRITE UA: NEGATIVE
PH UA: 6.5 (ref 5.0–9.0)
PROTEIN UA: NEGATIVE
RBC UA: <1 /HPF (ref <=3)
SPECIFIC GRAVITY UA: 1.008 (ref 1.003–1.030)
SQUAMOUS EPITHELIAL: <1 /HPF (ref 0–5)
UROBILINOGEN UA: <2
WBC UA: 1 /HPF (ref <=2)

## 2023-11-26 LAB — BASIC METABOLIC PANEL
ANION GAP: 8 mmol/L (ref 5–14)
BLOOD UREA NITROGEN: 15 mg/dL (ref 9–23)
BUN / CREAT RATIO: 13
CALCIUM: 8 mg/dL — ABNORMAL LOW (ref 8.7–10.4)
CHLORIDE: 111 mmol/L — ABNORMAL HIGH (ref 98–107)
CO2: 24 mmol/L (ref 20.0–31.0)
CREATININE: 1.12 mg/dL (ref 0.73–1.18)
EGFR CKD-EPI (2021) MALE: 81 mL/min/1.73m2 (ref >=60)
GLUCOSE RANDOM: 95 mg/dL (ref 70–179)
POTASSIUM: 3.7 mmol/L (ref 3.4–4.8)
SODIUM: 143 mmol/L (ref 135–145)

## 2023-11-26 LAB — LACTATE SEPSIS, VENOUS: LACTATE BLOOD VENOUS: 1.3 mmol/L (ref 0.5–1.8)

## 2023-11-26 LAB — SEDIMENTATION RATE: ERYTHROCYTE SEDIMENTATION RATE: 9 mm/h (ref 0–15)

## 2023-11-26 LAB — LIPASE: LIPASE: 61 U/L — ABNORMAL HIGH (ref 12–53)

## 2023-11-26 LAB — C-REACTIVE PROTEIN: C-REACTIVE PROTEIN: <5 mg/L (ref <=10.0)

## 2023-11-26 LAB — PHOSPHORUS: PHOSPHORUS: 3 mg/dL (ref 2.4–5.1)

## 2023-11-26 LAB — MAGNESIUM: MAGNESIUM: 1.5 mg/dL — ABNORMAL LOW (ref 1.6–2.6)

## 2023-11-26 LAB — HEMOGLOBIN A1C
ESTIMATED AVERAGE GLUCOSE: 103 mg/dL
HEMOGLOBIN A1C: 5.2 % (ref 4.8–5.6)

## 2023-11-26 LAB — VITAMIN B12: VITAMIN B-12: 1580 pg/mL — ABNORMAL HIGH (ref 211–911)

## 2023-11-26 LAB — FERRITIN: FERRITIN: 15.2 ng/mL (ref 10.5–307.3)

## 2023-11-26 MED ADMIN — acetaminophen (TYLENOL) tablet 650 mg: 650 mg | ORAL | @ 21:00:00 | Stop: 2023-11-26

## 2023-11-26 MED ADMIN — oxyCODONE (ROXICODONE) immediate release tablet 5 mg: 5 mg | ORAL | @ 12:00:00 | Stop: 2023-11-26

## 2023-11-26 MED ADMIN — prochlorperazine (COMPAZINE) injection 5 mg: 5 mg | INTRAVENOUS | @ 15:00:00

## 2023-11-26 MED ADMIN — prochlorperazine (COMPAZINE) injection 10 mg: 10 mg | INTRAMUSCULAR | @ 03:00:00 | Stop: 2023-11-25

## 2023-11-26 MED ADMIN — sodium chloride (NS) 0.9 % infusion: 20 mL/h | INTRAVENOUS | @ 21:00:00 | Stop: 2023-11-26

## 2023-11-26 MED ADMIN — ondansetron (ZOFRAN) injection 4 mg: 4 mg | INTRAVENOUS | @ 20:00:00

## 2023-11-26 MED ADMIN — acetaminophen (TYLENOL) tablet 650 mg: 650 mg | ORAL | @ 09:00:00 | Stop: 2023-11-26

## 2023-11-26 MED ADMIN — HYDROmorphone (PF) (DILAUDID) injection Syrg 0.5 mg: .5 mg | INTRAVENOUS | @ 20:00:00 | Stop: 2023-12-09

## 2023-11-26 MED ADMIN — cyanocobalamin (vitamin B-12) tablet 1,000 mcg: 1000 ug | ORAL | @ 15:00:00 | Stop: 2023-11-26

## 2023-11-26 MED ADMIN — diphenhydrAMINE (BENADRYL) capsule/tablet 25 mg: 25 mg | ORAL | @ 21:00:00 | Stop: 2023-11-26

## 2023-11-26 MED ADMIN — oxyCODONE (ROXICODONE) immediate release tablet 5 mg: 5 mg | ORAL | @ 04:00:00 | Stop: 2023-11-26

## 2023-11-26 MED ADMIN — iron dextran (INFED) 25 mg in sodium chloride (NS) 0.9 % 25 mL IVPB: 25 mg | INTRAVENOUS | @ 22:00:00 | Stop: 2023-11-26

## 2023-11-26 MED ADMIN — acetaminophen (TYLENOL) tablet 650 mg: 650 mg | ORAL | @ 03:00:00 | Stop: 2023-11-25

## 2023-11-26 MED ADMIN — melatonin tablet 3 mg: 3 mg | ORAL | @ 09:00:00

## 2023-11-26 MED ADMIN — melatonin tablet 3 mg: 3 mg | ORAL | @ 23:00:00

## 2023-11-26 MED ADMIN — enoxaparin (LOVENOX) syringe 40 mg: 40 mg | SUBCUTANEOUS | @ 06:00:00

## 2023-11-26 MED ADMIN — cariprazine (VRAYLAR) capsule 1.5 mg: 1.5 mg | ORAL | @ 15:00:00

## 2023-11-26 MED ADMIN — QUEtiapine (SEROQUEL) tablet 100 mg: 100 mg | ORAL | @ 06:00:00

## 2023-11-26 MED ADMIN — hydrOXYzine (ATARAX) tablet 25 mg: 25 mg | ORAL | @ 06:00:00

## 2023-11-26 MED ADMIN — pantoprazole (Protonix) EC tablet 40 mg: 40 mg | ORAL | @ 12:00:00

## 2023-11-26 MED ADMIN — magnesium oxide (MAG-OX) tablet 400 mg: 400 mg | ORAL | @ 12:00:00 | Stop: 2023-11-26

## 2023-11-26 MED ADMIN — cholecalciferol (vitamin D3 25 mcg (1,000 units)) tablet 25 mcg: 25 ug | ORAL | @ 12:00:00

## 2023-11-26 MED ADMIN — oxyCODONE (ROXICODONE) immediate release tablet 5 mg: 5 mg | ORAL | @ 15:00:00 | Stop: 2023-11-28

## 2023-11-26 MED ADMIN — prochlorperazine (COMPAZINE) injection 5 mg: 5 mg | INTRAVENOUS | @ 09:00:00

## 2023-11-26 NOTE — Unmapped (Signed)
 [facility]   Initial Consultation         Assessment and Recommendations:   Jerome Mesa. is a 21M w/ COPD, DVT, Crohn's disease of small and large intestine (dx 1990's) s/p ileocecectomy (2003) c/b SBO 2/2 to stricturing disease s/p right hemicolectomy and end-ileostomy (2021) complicated by short gut syndrome and high ostomy output s/p ostomy takedown and reanastomosis (10/2019), ventral abdominal hernia and incisional hernia s/p repair (2004), recurrent CDI (2023), who presented to [facility] with vomiting, diarrhea and abdominal pain.. The patient is seen in consultation at the request of Jerome Cagey, MD ([facility] (MD[facility])) for Crohn's disease.    Patient with CD s/p multiple surgical resections, stable on humira  for several years, but currently with worsening of disease given symptoms (pain, loose stools), imaging (new multifocal enhancement), elevated fecal calpro. The patient should undergo EGD and colonoscopy to re-stage his disease prior to determining therapy. This has been difficult for the patient in the past, due to [facility] discharges and poor preps. Ideally the patient would undergo a multi day prep in the hospital. We can try to prep him tonight and check in tomorrow if stools clear, but feel it is more likely that he will need to remain here over the weekend for prep and procedure Monday.    Recommendations:  - Start chemical deep vein thrombosis prophylaxis (patient is at high risk for clots in setting of IBD flare)  - Order daily CRP, CMP  - EGD and colonoscopy when prepped, as per below    GI Pre-Procedure Checklist  Procedure: Upper Endoscopy and Colonoscopy  Anticipated Date of Procedure: Can try for 4/25 but more likely Monday 4/28  Anticoagulants/Antiplatelets: Not Applicable  Anesthesia Concerns: Persistent vomiting  Diet: Order clear liquid and make NPO at 12AM (midnight) day of procedure  Prep: Give 4L Golytely  bowel prep today. Order bowel prep using MED GI PROCEDURES PREP order-set. You must select the appropriate prep, this is not auto-selected. If Golytely  is not available from pharmacy, please give Gatorade+Miralax  prep. The patient is adequately prepped when their stool is clear and yellow, like urine. Continue to order bowel prep until the patients stools are clear. The patient can continue drinking prep past midnight if not clear, but must be strictly NPO of all oral intake for at least 2 hours before procedure.      Issues Impacting Complexity of Management:  -None    Recommendations discussed with the patient's primary team. We will continue to follow along with you.    Subjective:   21M w/ COPD, DVT, Crohn's disease of small and large intestine (dx 1990's) s/p ileocecectomy (2003) c/b SBO 2/2 to stricturing disease s/p right hemicolectomy and end-ileostomy (2021) complicated by short gut syndrome and high ostomy output s/p ostomy takedown and reanastomosis (10/2019), ventral abdominal hernia and incisional hernia s/p repair (2004), recurrent CDI (2023), who presented to [facility] with vomiting, diarrhea and abdominal pain.    Patient has had 6 ED visits/short hospitalizations in the past 2 months. He has been admitted twice (4/13-4/15 and 4/18-4/20) but has left AMA. GI was not able to see him the first admission and saw him the second admission and recommended colonoscopy but he left prior to completing prep. He cited family emergencies as his reason for leaving but also was requesting IV pain meds and phenergan  which were not granted.     Regarding the patients IBD history, follows with Dr. Merlyn Starring in IBD clinic, last clinic visit October  2024. The plan was to repeat endoscopic evaluation because of chronic findings of neo-TI thickening and symptoms (abdominal pain, nausea, PO intolerance) that have been recurrent. He has been stable on weekly humira  since 2023. He was scheduled for an outpatient colonoscopy    Labs from recent hospitalization showed normal C diff, GIPP. Fecal calpro 717 (prev had been 64 fall 2024).    On arrival this hospitalization, VSS. Labs showed no leukocytosis, mild anemia, CRP 5. CTAP 11/21/23 shows new multifocal bowel wall thickening and enhancement in the right lower quadrant suspicious for colitis.    The patient reports ongoing nausea and abdominal pain. He is having loose stools 1-2x per day. Baseline is 2 formed stools per day.      Current medications:  Humira  weekly (07/2022- )     Prior medications:   Remicade - failed at [facility]     Prior GI work-up:  10/01/22 Colonoscopy: Stool in the entire colon, procedure had to be aborted. The neoterminal ileum was not intubated SES 0  10/01/22 EGD: normal   10/14/19 Colonoscopy: Unable to advance adult gastroscope beyond 40 cm   10/14/19 Ileoscopy: Normal ileum      -I have reviewed the patient's prior records from prior hospitalizations, outpatient visits as summarized in the HPI    Objective:   Temp:  [37.2 ??C (99 ??F)] 37.2 ??C (99 ??F)  Pulse:  [79-81] 79  SpO2 Pulse:  [66-80] 66  Resp:  [15-17] 15  BP: (105-125)/(77-90) 106/77  SpO2:  [92 %-98 %] 95 %    Gen: WDWN male in NAD, answers questions appropriately  Abdomen: abdominal hernia. NT/ND  Extremities: No edema in the BLEs    Pertinent Labs & Studies:  -I have reviewed the patient's labs from 11/26/23 which show stable Hgb, stable renal function (SCr, electrolytes), and stable LFTs

## 2023-11-26 NOTE — Unmapped (Addendum)
 Patient alert and oriented x 4.  @ RN skin check completed with Quin Brush.  No skin issues noted.  Patient vomited once he got meal, Diluadid and IV zofran  given with good results.  Patient tolerated iron  dextran test bag.  Awaiting large bag.  Golyghtly prepared.    Problem: Adult Inpatient Plan of Care  Goal: Absence of Hospital-Acquired Illness or Injury  Intervention: Prevent Skin Injury  Recent Flowsheet Documentation  Taken 11/26/2023 1400 by Holloway-Shambo, Shelbie Dess, RN  Positioning for Skin: Supine/Back  Skin Protection: adhesive use limited

## 2023-11-26 NOTE — Unmapped (Signed)
 Care Management  Initial Transition Planning Assessment              Patient lives alone in [place] [facility]) in a 2 level home with 1 steps to enter.  At baseline patient is independent with ADLs. He was not receives home health services. Patient does not have DME. Patient stated he was going to take the bus home.     General  Care Manager assessed the patient by : In person interview with patient, Medical record review, Discussion with Clinical Care team  Orientation Level: Oriented X4  Reason for referral: Discharge Planning    Contact/Decision Maker  Extended Emergency Contact Information  Primary Emergency Contact: Parker,Latoya  Mobile Phone: [phone]  Relation: Friend  Secondary Emergency Contact: Luckow,Bryant  Home Phone: [phone]  Mobile Phone: [phone]  Relation: Brother  Preferred language: ENGLISH  Interpreter needed? No  Mother: Juergen, [place] Phone: [phone]    Legal Next of Kin / Guardian / POA / Advance Directives     HCDM (patient stated preference): Abbott, Jasinski - Mother - [phone]    Advance Directive (Medical Treatment)  Does patient have an advance directive covering medical treatment?: Patient does not have advance directive covering medical treatment.    Health Care Decision Maker [HCDM] (Medical & Mental Health Treatment)  Healthcare Decision Maker: HCDM documented in the HCDM/Contact Info section.  Information offered on HCDM, Medical & Mental Health advance directives:: Patient given information.         Readmission Information    Have you been hospitalized in the last 30 days?: Yes  Name of Hospital: [facility]  Were you being cared for at a skilled nursing facility:: No     What day were you discharged from that hospital or facility?: 11/20/23  Number of Days between previous discharge and readmission date: 4-7 days    Type of Readmission: Unplanned readmission due to new medical issue/unrelated to previous admission    Readmission Source: Home       Did the following happen with your discharge?          Patient Information  Lives with: Alone    Type of Residence: Private residence        Location/Detail: [address] [place] [address]    Support Systems/Concerns: Family Members, Friends/Neighbors    Responsibilities/Dependents at home?: No    Home Care services in place prior to admission?: No                  Equipment Currently Used at Home: none       Currently receiving outpatient dialysis?: No       Financial Information       Need for financial assistance?: No       Social Determinants of Health  Social Drivers of Health     Food Insecurity: No Food Insecurity (11/26/2023)    Hunger Vital Sign     Worried About Running Out of Food in the Last Year: Never true     Ran Out of Food in the Last Year: Never true   Tobacco Use: High Risk (11/20/2023)    Patient History     Smoking Tobacco Use: Every Day     Smokeless Tobacco Use: Never     Passive Exposure: Current   Transportation Needs: No Transportation Needs (11/26/2023)    PRAPARE - Transportation     Lack of Transportation (Medical): No     Lack of Transportation (Non-Medical):  No   Alcohol Use: Not on file   Housing: Low Risk  (11/26/2023)    Housing     Within the past 12 months, have you ever stayed: outside, in a car, in a tent, in an overnight shelter, or temporarily in someone else's home (i.e. couch-surfing)?: No     Are you worried about losing your housing?: No   Physical Activity: Not on File (06/21/2021)    Received from [place], [place]    Physical Activity     Physical Activity: 0   Utilities: Low Risk  (11/26/2023)    Utilities     Within the past 12 months, have you been unable to get utilities (heat, electricity) when it was really needed?: No   Stress: Not on File (06/21/2021)    Received from [facility], [place]    Stress     Stress: 0   Interpersonal Safety: Not At Risk (11/15/2023)    Interpersonal Safety     Unsafe Where You Currently Live: No     Physically Hurt by Anyone: No     Abused by Anyone: No   Substance Use: Not on file (06/10/2023)   Intimate Partner Violence: Not At Risk (11/15/2023)    Humiliation, Afraid, Rape, and Kick questionnaire     Fear of Current or Ex-Partner: No     Emotionally Abused: No     Physically Abused: No     Sexually Abused: No   Social Connections: Not on File (04/18/2023)    Received from [facility]    Social Connections     Connectedness: 0   Financial Resource Strain: Low Risk  (11/26/2023)    Overall Financial Resource Strain (CARDIA)     Difficulty of Paying Living Expenses: Not hard at all   Depression: Not at risk (11/13/2022)    PHQ-2     PHQ-2 Score: 0   Internet Connectivity: Not on file   Health Literacy: Not on file       Complex Discharge Information    Is patient identified as a difficult/complex discharge?: No                                                               Interventions:       Discharge Needs Assessment  Concerns to be Addressed:      Clinical Risk Factors:      Barriers to taking medications: No    Prior overnight hospital stay or ED visit in last 90 days: No                        Discharge Facility/Level of Care Needs:      Readmission  Risk of Unplanned Readmission Score: UNPLANNED READMISSION SCORE: 42.94%  Predictive Model Details          43% (High)  Factor Value    Calculated 11/26/2023 12:05 38% Number of ED visits in last six months 11    [place] Risk of Unplanned Readmission Model 12% Number of active inpatient medication orders 29     6% Number of hospitalizations in last year 2     6% Active antipsychotic inpatient medication order present     6% ECG/EKG order present in last 6 months     6% Latest  calcium  low (8.0 mg/dL)     5% Diagnosis of electrolyte disorder present     4% Imaging order present in last 6 months     4% Latest hemoglobin low (11.9 g/dL)     4% Phosphorous result present     3% Diagnosis of deficiency anemia present     3% Active anticoagulant inpatient medication order present     2% Age 48     1% Charlson Comorbidity Index 2     1% Active ulcer inpatient medication order present     0% Current length of stay 0.515 days      Readmitted Within the Last 30 Days? (No if blank) Yes  Patient at risk for readmission?: Yes    Discharge Plan  Screen findings are: Discharge planning needs identified or anticipated (Comment).    Expected Discharge Date: 11/30/2023    Expected Transfer from Critical Care:                 Initial Assessment complete?: Yes

## 2023-11-26 NOTE — Unmapped (Addendum)
 Jerome Kennedy. is a 48 y.o. male whose presentation is complicated by Crohn's disease s/p hemicolectomy, COPD, bipolar disorder that presented to [facility] with abdominal pain, nausea, vomiting, and diarrhea with concern for Crohn's flare.     His hospital course by problem is outlined below:    Crohn's Disease Flare - Hx of CD s/p Hemicolectomy  Presents with abdominal pain, nausea and vomiting, and diarrhea, reports his symptoms have not changed since most recent discharge from [facility] on 4/20 for the same. Currently on Humira  weekly for CD. CT on 4/19 with new multifocal bowel wall thickening and enhancement in the RLQ and sigmoid suspicious for colitis. Stool calprotectin elevated at 717. C diff and GIPP negative. ESR/CRP here negative. Given negative infectious workup, concern for Crohn's flare with other lab and imaging findings. GI planning for colonoscopy once bowel prep is complete. Patient has difficulty with the bowel prep, most recent colonoscopies in 2020 and 2021 were unable to reach ileocolonic anastomosis due to poor prep and colonic anatomy. Started on DVT prophylaxis given increased risk in patients with IBD. Pain and nausea controlled with medications. Completed 3 day bowel prep. Repleted IV magnesium  and phosphate on 4/27 given magnesium  was 1.5 and phosphate 1.8 in the setting of multiple bowel movements. Underwent colonoscopy on 4/28 that was still complicated by stool in the colon but showed ileal inflammation (Rutgeert's i3) without colonic involvement. Received 5 doses of IV methylprednisolone , will discharge on oral prednisone  taper. CRP peaked at 19.9, now <5 on discharge. Pain and nausea improving on discharge. GI plans to change CD maintenance therapy from Humira  to Rinvoq  at follow-up appointment.    COPD Exacerbation  Reports increased cough and sputum production x 1 week. Denies history of COPD exacerbations. Wheezing present on physical exam. CXR clear. Started treatment for COPD exacerbation with prednisone  (will continue prednisone  oral taper for his Crohn's flare) and azithromycin  x 5 days (4/26-4/30). Continued Breo Ellipta  daily inpatient and duo nebs available PRN. Per chart review, unclear whether he has COPD vs asthma. Does have a tobacco use history. Only on Symbicort  in outpatient setting. Recommend getting spirometry done outpatient, it was ordered in June 2024 but does not look like it was completed.    Microcytic Anemia - Hx of Iron  Deficiency Anemia  Hgb low on admission 12.1. Iron  studies last checked 04/2023. Iron  studies repeated here, ferritin 15.2, iron  78. 1000 mg iron  deficit by the Ganzoni equation. S/p IV dextran on 4/24. Anemia has remained stable.    Bipolar Disorder  Continued home Vraylar  and Seroquel .    GERD  Continued home PPI.

## 2023-11-26 NOTE — Unmapped (Signed)
 Bed: 81-D  Expected date: 11/26/23  Expected time: 3:31 AM  Means of arrival:   Comments:

## 2023-11-26 NOTE — Unmapped (Signed)
 Internal Medicine (MEDW) Progress Note    Assessment & Plan:   Jerome Pasha. is a 48 y.o. male whose presentation is complicated by Crohn's disease s/p hemicolectomy, COPD, bipolar disorder that presented to [facility] with abdominal pain, nausea, vomiting, and diarrhea with concern for Crohn's flare.     Principal Problem:    Exacerbation of Crohn's disease  Active Problems:    Crohn's disease    Abdominal pain    Loose stools    Bipolar 1 disorder    COPD (chronic obstructive pulmonary disease)    Active Problems    Abdominal Pain, N/V/D - c/f Crohn's Flare - Hx of CD s/p Hemicolectomy  Presents with abdominal pain, nausea and vomiting, and diarrhea, reports his symptoms have not changed since most recent discharge from [facility] on 4/20 for the same. Currently on Humira  weekly for CD. CT on 4/19 with new multifocal bowel wall thickening and enhancement in the RLQ and sigmoid suspicious for colitis. Stool calprotectin elevated at 717. C diff and GIPP negative. ESR/CRP here negative. GI last admission recommended colonoscopy, will likely need a 2-day bowel prep given that most recent colonoscopies in 2020 and 2021 were unable to reach ileocolonic anastomosis due to poor prep and colonic anatomy. Given negative infectious workup, concern for Crohn's flare with other lab and imaging findings. Will consult GI for colonoscopy.  - Bowel prep today  - GI consulted, planning for colonoscopy tomorrow if bowel prep is clear  - CLD, NPO at [facility] for colonoscopy  - Pain: sch Tylenol , prn oxycodone  5-10 panel, dilaudid   - Nausea: prn compazine   - DVT ppx given high risk for clots with IBD  - Follow up stool calprotectin 4/23  - Daily BMP, Mg, P    Microcytic Anemia - Hx of Iron  Deficiency Anemia  Hgb low on admission 12.1. Iron  studies last checked 04/2023. Iron  studies repeated here, ferritin 15.2, remaining labs pending. Will likely need IV iron  supplementation. 1000 mg iron  deficit by the Ganzoni equation.  - Iron  studies  - Daily CBC w Diff    Vitamin Deficiencies  Vitamin B12 low to 157 in 06/2022. Vitamin D low to <4 in 07/2022. Will recheck both values and start supplementation.   - Replete vitamin D, B12    Chronic Problems    Bipolar disorder: Continue Vraylar  and Seroquel    COPD: Continue home Symbicort    GERD: Continue home PPI    Daily Checklist:  Diet: Clear Liquid Diet and NPO at [facility]  DVT PPx: Lovenox  40mg  q24h  Electrolytes: Magnesium  Repleted  Code Status: Full Code  Dispo: Admit to floor    Team Contact Information:   Primary Team: Internal Medicine (MEDW)  Primary Resident: Marcey Server, MD; Evone Hoh, MS4  Resident's Pager: [phone] (Gen MedW Senior Resident)    Interval History:     No acute events overnight. Abdominal pain is a 5/10 this morning, improving with oxycodone , states that it had acutely worsened prior to EMS call which prompted him to come to the ED, was associated with nausea and vomiting at that time. Nausea has improved. Had been having ~2 episodes of diarrhea daily. Amenable to starting bowel prep today.    Objective:   Temp:  [37.2 ??C (99 ??F)] 37.2 ??C (99 ??F)  Pulse:  [79-81] 79  SpO2 Pulse:  [66-80] 66  Resp:  [15-17] 15  BP: (105-125)/(77-90) 106/77  SpO2:  [92 %-98 %] 95 %    Gen: NAD, converses, appears uncomfortable  HENT: atraumatic,  normocephalic  Heart: RRR  Lungs: CTAB, no crackles, wheezing present  Abdomen: soft, nondistended, ventral hernia present but soft and reducible, TTP in RLQ and left side of abdomen  Extremities: No edema    Labs:  Lab Results   Component Value Date    WBC 5.8 11/26/2023    HGB 11.9 (L) 11/26/2023    HCT 36.8 (L) 11/26/2023    PLT 209 11/26/2023     Lab Results   Component Value Date    NA 143 11/26/2023    K 3.7 11/26/2023    CL 111 (H) 11/26/2023    CO2 24.0 11/26/2023    BUN 15 11/26/2023    CREATININE 1.12 11/26/2023    GLU 95 11/26/2023    CALCIUM  8.0 (L) 11/26/2023    MG 1.5 (L) 11/26/2023    PHOS 3.0 11/26/2023     Lab Results   Component Value Date    BILITOT 0.2 (L) 11/26/2023    BILIDIR 0.20 04/26/2023    PROT 5.8 11/26/2023    ALBUMIN 3.0 (L) 11/26/2023    ALT 11 11/26/2023    AST 16 11/26/2023    ALKPHOS 128 (H) 11/26/2023    GGT 57 01/05/2013     Lab Results   Component Value Date    PT 11.3 10/18/2023    INR 0.99 10/18/2023    APTT 22.4 (L) 10/18/2023      Latest Reference Range & Units 11/26/23 05:19   Ferritin 10.5 - 307.3 ng/mL 15.2      Latest Reference Range & Units 11/26/23 00:30   Lactate, Venous 0.5 - 1.8 mmol/L 1.3      Latest Reference Range & Units 11/21/23 19:06   Calprotectin, Stool <50.0 (Normal) mcg/g 717 (H)   (H): Data is abnormally high     Latest Reference Range & Units 11/26/23 05:19   Sed Rate 0 - 15 mm/h 9   CRP <=10.0 mg/L <5.0     GIPP, C Diff 4/19: negative    Imaging:  4/19 CT Abdomen Pelvis  -New multifocal bowel wall thickening and hyperenhancement within the right lower quadrant large bowel and the sigmoid colon suspicious for colitis, likely secondary to Crohn's disease.  -Mildly increased gas and fluid-filled distention of the distal sigmoid colon and rectum which can be seen in a diarrheal state.  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      Evone Hoh, MS4  [facility]      I attest that I have reviewed the medical student note and that the components of the history of the present illness, the physical exam, and the assessment and plan documented were performed by me or were performed in my presence by the student where I verified the documentation and performed (or re-performed) the exam and medical decision making.   - Marcey Server, MD

## 2023-11-27 LAB — CBC W/ AUTO DIFF
BASOPHILS ABSOLUTE COUNT: 0 10*9/L (ref 0.0–0.1)
BASOPHILS RELATIVE PERCENT: 1 %
EOSINOPHILS ABSOLUTE COUNT: 0.2 10*9/L (ref 0.0–0.5)
EOSINOPHILS RELATIVE PERCENT: 3.8 %
HEMATOCRIT: 36.9 % — ABNORMAL LOW (ref 39.0–48.0)
HEMOGLOBIN: 12.3 g/dL — ABNORMAL LOW (ref 12.9–16.5)
LYMPHOCYTES ABSOLUTE COUNT: 2 10*9/L (ref 1.1–3.6)
LYMPHOCYTES RELATIVE PERCENT: 47.6 %
MEAN CORPUSCULAR HEMOGLOBIN CONC: 33.3 g/dL (ref 32.0–36.0)
MEAN CORPUSCULAR HEMOGLOBIN: 26.9 pg (ref 25.9–32.4)
MEAN CORPUSCULAR VOLUME: 80.9 fL (ref 77.6–95.7)
MEAN PLATELET VOLUME: 8 fL (ref 6.8–10.7)
MONOCYTES ABSOLUTE COUNT: 0.3 10*9/L (ref 0.3–0.8)
MONOCYTES RELATIVE PERCENT: 7 %
NEUTROPHILS ABSOLUTE COUNT: 1.7 10*9/L — ABNORMAL LOW (ref 1.8–7.8)
NEUTROPHILS RELATIVE PERCENT: 40.6 %
PLATELET COUNT: 182 10*9/L (ref 150–450)
RED BLOOD CELL COUNT: 4.56 10*12/L (ref 4.26–5.60)
RED CELL DISTRIBUTION WIDTH: 15.1 % (ref 12.2–15.2)
WBC ADJUSTED: 4.1 10*9/L (ref 3.6–11.2)

## 2023-11-27 LAB — BASIC METABOLIC PANEL
ANION GAP: 7 mmol/L (ref 5–14)
BLOOD UREA NITROGEN: 9 mg/dL (ref 9–23)
BUN / CREAT RATIO: 10
CALCIUM: 8 mg/dL — ABNORMAL LOW (ref 8.7–10.4)
CHLORIDE: 111 mmol/L — ABNORMAL HIGH (ref 98–107)
CO2: 23 mmol/L (ref 20.0–31.0)
CREATININE: 0.92 mg/dL (ref 0.73–1.18)
EGFR CKD-EPI (2021) MALE: >90 mL/min/1.73m2 (ref >=60)
GLUCOSE RANDOM: 78 mg/dL (ref 70–179)
POTASSIUM: 4 mmol/L (ref 3.4–4.8)
SODIUM: 141 mmol/L (ref 135–145)

## 2023-11-27 LAB — MAGNESIUM: MAGNESIUM: 1.6 mg/dL (ref 1.6–2.6)

## 2023-11-27 LAB — PHOSPHORUS: PHOSPHORUS: 2.7 mg/dL (ref 2.4–5.1)

## 2023-11-27 LAB — C-REACTIVE PROTEIN: C-REACTIVE PROTEIN: <5 mg/L (ref <=10.0)

## 2023-11-27 MED ADMIN — oxyCODONE (ROXICODONE) immediate release tablet 5 mg: 5 mg | ORAL | Stop: 2023-11-28

## 2023-11-27 MED ADMIN — ondansetron (ZOFRAN) injection 4 mg: 4 mg | INTRAVENOUS | @ 08:00:00

## 2023-11-27 MED ADMIN — QUEtiapine (SEROQUEL) tablet 100 mg: 100 mg | ORAL

## 2023-11-27 MED ADMIN — HYDROmorphone (PF) (DILAUDID) injection Syrg 0.5 mg: .5 mg | INTRAVENOUS | @ 21:00:00 | Stop: 2023-12-09

## 2023-11-27 MED ADMIN — acetaminophen (TYLENOL) tablet 1,000 mg: 1000 mg | ORAL | @ 23:00:00

## 2023-11-27 MED ADMIN — prochlorperazine (COMPAZINE) injection 5 mg: 5 mg | INTRAVENOUS

## 2023-11-27 MED ADMIN — ondansetron (ZOFRAN) injection 4 mg: 4 mg | INTRAVENOUS | @ 21:00:00

## 2023-11-27 MED ADMIN — cariprazine (VRAYLAR) capsule 1.5 mg: 1.5 mg | ORAL | @ 14:00:00

## 2023-11-27 MED ADMIN — ondansetron (ZOFRAN) injection 4 mg: 4 mg | INTRAVENOUS | @ 14:00:00

## 2023-11-27 MED ADMIN — iron dextran (INFED) 1,000 mg in sodium chloride (NS) 0.9 % 500 mL IVPB: 1000 mg | INTRAVENOUS | @ 01:00:00

## 2023-11-27 MED ADMIN — oxyCODONE (ROXICODONE) immediate release tablet 5 mg: 5 mg | ORAL | @ 08:00:00 | Stop: 2023-11-28

## 2023-11-27 MED ADMIN — peg-electrolyte soln (GoLYTELY) solution 4,000 mL: 4000 mL | ORAL | Stop: 2023-11-26

## 2023-11-27 MED ADMIN — prochlorperazine (COMPAZINE) injection 5 mg: 5 mg | INTRAVENOUS | @ 17:00:00

## 2023-11-27 MED ADMIN — pantoprazole (Protonix) EC tablet 40 mg: 40 mg | ORAL | @ 14:00:00

## 2023-11-27 MED ADMIN — nicotine (NICODERM CQ) 14 mg/24 hr patch 1 patch: 1 | TRANSDERMAL

## 2023-11-27 MED ADMIN — melatonin tablet 3 mg: 3 mg | ORAL | @ 23:00:00

## 2023-11-27 MED ADMIN — enoxaparin (LOVENOX) syringe 40 mg: 40 mg | SUBCUTANEOUS

## 2023-11-27 MED ADMIN — cholecalciferol (vitamin D3 25 mcg (1,000 units)) tablet 25 mcg: 25 ug | ORAL | @ 14:00:00

## 2023-11-27 MED ADMIN — oxyCODONE (ROXICODONE) immediate release tablet 10 mg: 10 mg | ORAL | @ 17:00:00 | Stop: 2023-11-28

## 2023-11-27 MED ADMIN — HYDROmorphone (PF) (DILAUDID) injection Syrg 0.5 mg: .5 mg | INTRAVENOUS | @ 14:00:00 | Stop: 2023-12-09

## 2023-11-27 MED ADMIN — peg-electrolyte soln (GoLYTELY) solution 4,000 mL: 4000 mL | ORAL | @ 21:00:00 | Stop: 2023-11-27

## 2023-11-27 NOTE — Unmapped (Signed)
 Internal Medicine (MEDW) Progress Note    Assessment & Plan:   Jerome Kennedy. is a 48 y.o. male whose presentation is complicated by Crohn's disease s/p hemicolectomy, COPD, bipolar disorder that presented to [facility] with abdominal pain, nausea, vomiting, and diarrhea with concern for Crohn's flare.     Principal Problem:    Exacerbation of Crohn's disease  Active Problems:    Crohn's disease    Abdominal pain    Loose stools    Bipolar 1 disorder    COPD (chronic obstructive pulmonary disease)    Active Problems    Abdominal Pain, N/V/D - c/f Crohn's Flare - Hx of CD s/p Hemicolectomy  Presents with abdominal pain, nausea and vomiting, and diarrhea, reports his symptoms have not changed since most recent discharge from [facility] on 4/20 for the same. Currently on Humira  weekly for CD. CT on 4/19 with new multifocal bowel wall thickening and enhancement in the RLQ and sigmoid suspicious for colitis. Stool calprotectin elevated at 717. C diff and GIPP negative. ESR/CRP here negative. Given negative infectious workup, concern for Crohn's flare with other lab and imaging findings. GI planning for colonoscopy once bowel prep is complete. Patient has difficulty with the bowel prep, most recent colonoscopies in 2020 and 2021 were unable to reach ileocolonic anastomosis due to poor prep and colonic anatomy. Bowel prep here is complicated by patient having nausea and vomiting and also being on opioid pain medications. KUB 4/25 with mild colonic dilation, negative for small bowel obstruction. Will continue prep over weekend, can consider senna if needed. Enema unlikely to help per GI.  - GI planning for colonoscopy on 4/28  - Continue GoLytely  bowel prep  - Full liquid diet, resume clear liquid diet on 4/27  - Sch Tylenol , prn oxycodone  5-10 panel, dilaudid  for pain  - Compazine , zofran  prn for nausea  - DVT ppx given high risk for clots with IBD  - Daily CMP, CRP    Microcytic Anemia - Hx of Iron  Deficiency Anemia  Hgb low on admission 12.1. Iron  studies last checked 04/2023. Iron  studies repeated here, ferritin 15.2, iron  78. 1000 mg iron  deficit by the Ganzoni equation.  - Given IV dextran  - Daily CBC    Vitamin Deficiencies  Vitamin B12 low to 157 in 06/2022. Vitamin D low to <4 in 07/2022. Will recheck both values and start supplementation.   - Replete vitamin D, B12    Chronic Problems    Bipolar disorder: Continue Vraylar  and Seroquel    COPD: Continue home Symbicort    GERD: Continue home PPI    Daily Checklist:  Diet: Full Liquid Diet  DVT PPx: Lovenox  40mg  q24h  Electrolytes: No Repletion Needed  Code Status: Full Code  Dispo: Admit to floor    Team Contact Information:   Primary Team: Internal Medicine (MEDW)  Primary Resident: Marcey Server, MD; Evone Hoh, MS4  Resident's Pager: [phone] (Gen MedW Senior Resident)    Interval History:     No acute events overnight. Abdominal pain controlled with pain medications. Continues to have nausea and vomiting. Vomited straight GoLytely  this AM. Has not had a bowel movement since starting bowel prep last night. Encouraged patient to continue bowel prep over weekend and that we would put him back on liquid diet given unlikely to get colonoscopy today.    Objective:   Temp:  [36.5 ??C (97.7 ??F)-36.8 ??C (98.2 ??F)] 36.5 ??C (97.7 ??F)  Pulse:  [61-90] 65  Resp:  [16-18] 16  BP: (  93-139)/(64-93) 109/71  SpO2:  [95 %-99 %] 96 %    Gen: NAD, converses, appears uncomfortable  HENT: atraumatic, normocephalic  Heart: RRR  Lungs: CTAB, no crackles, wheezing present  Abdomen: soft, nondistended, ventral hernia present but soft and reducible, TTP in RLQ and left side of abdomen  Extremities: No edema    Labs:  Lab Results   Component Value Date    WBC 4.1 11/27/2023    HGB 12.3 (L) 11/27/2023    HCT 36.9 (L) 11/27/2023    PLT 182 11/27/2023     Lab Results   Component Value Date    NA 141 11/27/2023    K 4.0 11/27/2023    CL 111 (H) 11/27/2023    CO2 23.0 11/27/2023    BUN 9 11/27/2023    CREATININE 0.92 11/27/2023    GLU 78 11/27/2023    CALCIUM  8.0 (L) 11/27/2023    MG 1.6 11/27/2023    PHOS 2.7 11/27/2023     Lab Results   Component Value Date    BILITOT 0.2 (L) 11/26/2023    BILIDIR 0.20 04/26/2023    PROT 5.8 11/26/2023    ALBUMIN 3.0 (L) 11/26/2023    ALT 11 11/26/2023    AST 16 11/26/2023    ALKPHOS 128 (H) 11/26/2023    GGT 57 01/05/2013     Lab Results   Component Value Date    PT 11.3 10/18/2023    INR 0.99 10/18/2023    APTT 22.4 (L) 10/18/2023      Latest Reference Range & Units 11/21/23 19:06   Calprotectin, Stool <50.0 (Normal) mcg/g 717 (H)   (H): Data is abnormally high     Latest Reference Range & Units 11/26/23 05:19 11/27/23 04:05   CRP <=10.0 mg/L <5.0 <5.0     GIPP, C Diff 4/19: negative    Imaging:  4/25 KUB  Mild colonic dilatation, similar to prior CT. There is no small bowel or gaseous distention. Lucency in the region of the central diaphragm likely reflects the bowel loop which is known to be opposed to the ventral abdominal wall. No evidence of pneumoperitoneum. Lung bases are clear. No acute osseous abnormality.    4/19 CT Abdomen Pelvis  -New multifocal bowel wall thickening and hyperenhancement within the right lower quadrant large bowel and the sigmoid colon suspicious for colitis, likely secondary to Crohn's disease.  -Mildly increased gas and fluid-filled distention of the distal sigmoid colon and rectum which can be seen in a diarrheal state.  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      Evone Hoh, MS4  [facility]      I attest that I have reviewed the medical student note and that the components of the history of the present illness, the physical exam, and the assessment and plan documented were performed by me or were performed in my presence by the student where I verified the documentation and performed (or re-performed) the exam and medical decision making.   - Marcey Server, MD

## 2023-11-27 NOTE — Unmapped (Signed)
 Pt A&Ox4, VSS, up without assistance to BR, admitted for Crohn's flare, currently tolerating bowel prep, on 2nd 4L now, PRN oxy and dilaudid  given for pain, PRN zofran  and compazine  for nausea, some emesis this morning but resolved after compazine , continuing with plan of care.    Problem: Adult Inpatient Plan of Care  Goal: Plan of Care Review  Outcome: Progressing  Goal: Patient-Specific Goal (Individualized)  Outcome: Progressing  Goal: Absence of Hospital-Acquired Illness or Injury  Outcome: Progressing  Goal: Optimal Comfort and Wellbeing  Outcome: Progressing  Goal: Readiness for Transition of Care  Outcome: Progressing  Goal: Rounds/Family Conference  Outcome: Progressing     Problem: Bowel Disease, Inflammatory (Ulcerative Colitis or Crohn's Disease)  Goal: Optimal Adaptation to Chronic Illness  Outcome: Progressing  Goal: Diarrhea Symptom Relief  Outcome: Progressing  Goal: Absence of Infection Signs and Symptoms  Outcome: Progressing  Goal: Optimal Nutrition Delivery  Outcome: Progressing  Goal: Optimal Pain Control and Function  Outcome: Progressing

## 2023-11-27 NOTE — Unmapped (Signed)
 Patient remains alert and oriented x 4. Vitals stable. Encouragement provided to complete bowel prep. 2L of prep completed as of 2 am and patient has yet to have a BM. Patient requested nicotine  patch overnight. Nicotine  patch to left upper arm. Medicated with oxycodone  for ongoing abdominal pain and both compazine  and zofran  for nausea with relief. No emesis overnight. Plan of care ongoing.    Problem: Adult Inpatient Plan of Care  Goal: Plan of Care Review  11/27/2023 0410 by Ervin Heath, RN  Outcome: Progressing  Flowsheets (Taken 11/27/2023 0410)  Progress: improving  Plan of Care Reviewed With: patient  11/27/2023 0014 by Ervin Heath, RN  Outcome: Progressing  Flowsheets (Taken 11/27/2023 0014)  Progress: improving  Plan of Care Reviewed With: patient  Goal: Patient-Specific Goal (Individualized)  11/27/2023 0410 by Ervin Heath, RN  Outcome: Progressing  11/27/2023 0014 by Ervin Heath, RN  Outcome: Progressing  Goal: Absence of Hospital-Acquired Illness or Injury  11/27/2023 0410 by Ervin Heath, RN  Outcome: Progressing  11/27/2023 0014 by Ervin Heath, RN  Outcome: Progressing  Intervention: Identify and Manage Fall Risk  11/27/2023 0410 by Ervin Heath, RN  Flowsheets (Taken 11/27/2023 0410)  Safety Interventions:   fall reduction program maintained   environmental modification   low bed   lighting adjusted for tasks/safety  11/27/2023 0014 by Ervin Heath, RN  Flowsheets (Taken 11/27/2023 0014)  Safety Interventions:   lighting adjusted for tasks/safety   low bed   fall reduction program maintained   environmental modification  Intervention: Prevent Skin Injury  11/27/2023 0410 by Ervin Heath, RN  Flowsheets (Taken 11/27/2023 0410)  Skin Protection: incontinence pads utilized  11/27/2023 0014 by Ervin Heath, RN  Flowsheets (Taken 11/27/2023 0014)  Skin Protection: incontinence pads utilized  Intervention: Prevent Infection  11/27/2023 0410 by Ervin Heath, RN  Flowsheets (Taken 11/27/2023 0410)  Infection Prevention: rest/sleep promoted  11/27/2023 0014 by Ervin Heath, RN  Flowsheets (Taken 11/27/2023 0014)  Infection Prevention: rest/sleep promoted  Goal: Optimal Comfort and Wellbeing  11/27/2023 0410 by Ervin Heath, RN  Outcome: Progressing  11/27/2023 0014 by Ervin Heath, RN  Outcome: Progressing  Intervention: Monitor Pain and Promote Comfort  11/27/2023 0410 by Ervin Heath, RN  Flowsheets (Taken 11/27/2023 0410)  Pain Management Interventions:   quiet environment facilitated   relaxation techniques promoted  11/27/2023 0014 by Ervin Heath, RN  Flowsheets (Taken 11/27/2023 0014)  Pain Management Interventions:   quiet environment facilitated   relaxation techniques promoted  Intervention: Provide Person-Centered Care  11/27/2023 0410 by Ervin Heath, RN  Flowsheets (Taken 11/27/2023 0410)  Trust Relationship/Rapport:   care explained   questions answered   questions encouraged   thoughts/feelings acknowledged  11/27/2023 0014 by Ervin Heath, RN  Flowsheets (Taken 11/27/2023 0014)  Trust Relationship/Rapport:   questions answered   care explained   questions encouraged   thoughts/feelings acknowledged  Goal: Readiness for Transition of Care  11/27/2023 0410 by Ervin Heath, RN  Outcome: Progressing  11/27/2023 0014 by Ervin Heath, RN  Outcome: Progressing  Goal: Rounds/Family Conference  11/27/2023 0410 by Ervin Heath, RN  Outcome: Progressing  11/27/2023 0014 by Ervin Heath, RN  Outcome: Progressing     Problem: Bowel Disease, Inflammatory (Ulcerative Colitis or Crohn's Disease)  Goal: Optimal Adaptation to Chronic Illness  11/27/2023 0410 by Ervin Heath, RN  Outcome: Progressing  11/27/2023 0014 by Ervin Heath, RN  Outcome: Progressing  Goal: Diarrhea Symptom Relief  11/27/2023 0410 by Ervin Heath, RN  Outcome: Progressing  11/27/2023 0014  by Ervin Heath, RN  Outcome: Progressing  Goal: Absence of Infection Signs and Symptoms  11/27/2023 0410 by Ervin Heath, RN  Outcome: Progressing  11/27/2023 0014 by Ervin Heath, RN  Outcome: Progressing  Goal: Optimal Nutrition Delivery  11/27/2023 0410 by Ervin Heath, RN  Outcome: Progressing  11/27/2023 0014 by Ervin Heath, RN  Outcome: Progressing  Goal: Optimal Pain Control and Function  11/27/2023 0410 by Ervin Heath, RN  Outcome: Progressing  11/27/2023 0014 by Ervin Heath, RN  Outcome: Progressing  Intervention: Prevent or Manage Pain  Recent Flowsheet Documentation  Taken 11/27/2023 0410 by Ervin Heath, RN  Pain Management Interventions:   quiet environment facilitated   relaxation techniques promoted  Taken 11/27/2023 0014 by Ervin Heath, RN  Pain Management Interventions:   quiet environment facilitated   relaxation techniques promoted

## 2023-11-28 LAB — COMPREHENSIVE METABOLIC PANEL
ALBUMIN: 3.2 g/dL — ABNORMAL LOW (ref 3.4–5.0)
ALKALINE PHOSPHATASE: 148 U/L — ABNORMAL HIGH (ref 46–116)
ALT (SGPT): 9 U/L — ABNORMAL LOW (ref 10–49)
ANION GAP: 6 mmol/L (ref 5–14)
AST (SGOT): 19 U/L (ref <=34)
BILIRUBIN TOTAL: 0.4 mg/dL (ref 0.3–1.2)
BLOOD UREA NITROGEN: 5 mg/dL — ABNORMAL LOW (ref 9–23)
BUN / CREAT RATIO: 5
CALCIUM: 8.8 mg/dL (ref 8.7–10.4)
CHLORIDE: 111 mmol/L — ABNORMAL HIGH (ref 98–107)
CO2: 27 mmol/L (ref 20.0–31.0)
CREATININE: 0.93 mg/dL (ref 0.73–1.18)
EGFR CKD-EPI (2021) MALE: >90 mL/min/1.73m2 (ref >=60)
GLUCOSE RANDOM: 75 mg/dL (ref 70–179)
POTASSIUM: 3.6 mmol/L (ref 3.4–4.8)
PROTEIN TOTAL: 6.1 g/dL (ref 5.7–8.2)
SODIUM: 144 mmol/L (ref 135–145)

## 2023-11-28 LAB — CBC W/ AUTO DIFF
BASOPHILS ABSOLUTE COUNT: 0 10*9/L (ref 0.0–0.1)
BASOPHILS RELATIVE PERCENT: 0.9 %
EOSINOPHILS ABSOLUTE COUNT: 0.1 10*9/L (ref 0.0–0.5)
EOSINOPHILS RELATIVE PERCENT: 2.5 %
HEMATOCRIT: 42.1 % (ref 39.0–48.0)
HEMOGLOBIN: 13.7 g/dL (ref 12.9–16.5)
LYMPHOCYTES ABSOLUTE COUNT: 1.7 10*9/L (ref 1.1–3.6)
LYMPHOCYTES RELATIVE PERCENT: 38.9 %
MEAN CORPUSCULAR HEMOGLOBIN CONC: 32.6 g/dL (ref 32.0–36.0)
MEAN CORPUSCULAR HEMOGLOBIN: 27.1 pg (ref 25.9–32.4)
MEAN CORPUSCULAR VOLUME: 83.1 fL (ref 77.6–95.7)
MEAN PLATELET VOLUME: 8.1 fL (ref 6.8–10.7)
MONOCYTES ABSOLUTE COUNT: 0.4 10*9/L (ref 0.3–0.8)
MONOCYTES RELATIVE PERCENT: 9.2 %
NEUTROPHILS ABSOLUTE COUNT: 2.1 10*9/L (ref 1.8–7.8)
NEUTROPHILS RELATIVE PERCENT: 48.5 %
PLATELET COUNT: 178 10*9/L (ref 150–450)
RED BLOOD CELL COUNT: 5.06 10*12/L (ref 4.26–5.60)
RED CELL DISTRIBUTION WIDTH: 14.9 % (ref 12.2–15.2)
WBC ADJUSTED: 4.4 10*9/L (ref 3.6–11.2)

## 2023-11-28 LAB — MAGNESIUM: MAGNESIUM: 1.6 mg/dL (ref 1.6–2.6)

## 2023-11-28 LAB — VITAMIN D 25 HYDROXY: VITAMIN D, TOTAL (25OH): 22.7 ng/mL (ref 20.0–80.0)

## 2023-11-28 LAB — C-REACTIVE PROTEIN: C-REACTIVE PROTEIN: 13.5 mg/L — ABNORMAL HIGH (ref <=10.0)

## 2023-11-28 LAB — PHOSPHORUS: PHOSPHORUS: 2.8 mg/dL (ref 2.4–5.1)

## 2023-11-28 MED ADMIN — acetaminophen (TYLENOL) tablet 1,000 mg: 1000 mg | ORAL | @ 09:00:00

## 2023-11-28 MED ADMIN — cholecalciferol (vitamin D3 25 mcg (1,000 units)) tablet 25 mcg: 25 ug | ORAL | @ 13:00:00

## 2023-11-28 MED ADMIN — melatonin tablet 3 mg: 3 mg | ORAL | @ 23:00:00

## 2023-11-28 MED ADMIN — prochlorperazine (COMPAZINE) injection 5 mg: 5 mg | INTRAVENOUS | @ 12:00:00

## 2023-11-28 MED ADMIN — oxyCODONE (ROXICODONE) immediate release tablet 5 mg: 5 mg | ORAL | @ 13:00:00 | Stop: 2023-11-28

## 2023-11-28 MED ADMIN — predniSONE (DELTASONE) tablet 40 mg: 40 mg | ORAL | @ 16:00:00 | Stop: 2023-12-03

## 2023-11-28 MED ADMIN — HYDROmorphone (PF) (DILAUDID) injection Syrg 0.5 mg: .5 mg | INTRAVENOUS | @ 08:00:00 | Stop: 2023-12-09

## 2023-11-28 MED ADMIN — acetaminophen (TYLENOL) tablet 1,000 mg: 1000 mg | ORAL | @ 16:00:00

## 2023-11-28 MED ADMIN — prochlorperazine (COMPAZINE) injection 5 mg: 5 mg | INTRAVENOUS | @ 03:00:00

## 2023-11-28 MED ADMIN — prochlorperazine (COMPAZINE) injection 5 mg: 5 mg | INTRAVENOUS | @ 20:00:00

## 2023-11-28 MED ADMIN — ondansetron (ZOFRAN) injection 4 mg: 4 mg | INTRAVENOUS | @ 16:00:00

## 2023-11-28 MED ADMIN — nicotine (NICODERM CQ) 14 mg/24 hr patch 1 patch: 1 | TRANSDERMAL | @ 03:00:00

## 2023-11-28 MED ADMIN — enoxaparin (LOVENOX) syringe 40 mg: 40 mg | SUBCUTANEOUS

## 2023-11-28 MED ADMIN — HYDROmorphone (PF) (DILAUDID) injection Syrg 0.5 mg: .5 mg | INTRAVENOUS | @ 20:00:00 | Stop: 2023-12-09

## 2023-11-28 MED ADMIN — fluticasone furoate-vilanterol (BREO ELLIPTA) 100-25 mcg/dose inhaler 1 puff: 1 | RESPIRATORY_TRACT | @ 12:00:00

## 2023-11-28 MED ADMIN — oxyCODONE (ROXICODONE) immediate release tablet 5 mg: 5 mg | ORAL | @ 16:00:00 | Stop: 2023-11-30

## 2023-11-28 MED ADMIN — pantoprazole (Protonix) EC tablet 40 mg: 40 mg | ORAL | @ 13:00:00

## 2023-11-28 MED ADMIN — cariprazine (VRAYLAR) capsule 1.5 mg: 1.5 mg | ORAL | @ 13:00:00

## 2023-11-28 MED ADMIN — hydrOXYzine (ATARAX) tablet 25 mg: 25 mg | ORAL

## 2023-11-28 MED ADMIN — oxyCODONE (ROXICODONE) immediate release tablet 10 mg: 10 mg | ORAL | @ 03:00:00 | Stop: 2023-11-28

## 2023-11-28 MED ADMIN — azithromycin (ZITHROMAX) tablet 500 mg: 500 mg | ORAL | @ 16:00:00 | Stop: 2023-11-28

## 2023-11-28 MED ADMIN — acetaminophen (TYLENOL) tablet 1,000 mg: 1000 mg | ORAL | @ 22:00:00

## 2023-11-28 MED ADMIN — QUEtiapine (SEROQUEL) tablet 100 mg: 100 mg | ORAL

## 2023-11-28 MED ADMIN — ondansetron (ZOFRAN) injection 4 mg: 4 mg | INTRAVENOUS | @ 08:00:00

## 2023-11-28 NOTE — Unmapped (Signed)
 Pt A&Ox4, VSS, up ad lib in room, bedside commode for transfer safety, admitted for Crohn's flare, encouraging drinking of colonoscopy prep for scope on Monday, PRN oxycodone  and dilaudid  given for pain, Zofran  and Compazine  given for nausea, continuing with plan of care.    Problem: Adult Inpatient Plan of Care  Goal: Plan of Care Review  Outcome: Progressing  Goal: Patient-Specific Goal (Individualized)  Outcome: Progressing  Goal: Absence of Hospital-Acquired Illness or Injury  Outcome: Progressing  Intervention: Prevent and Manage VTE (Venous Thromboembolism) Risk  Recent Flowsheet Documentation  Taken 11/28/2023 1600 by Daryll Epp, RN  Anti-Embolism Device Status: (Lovenox ) Other (Comment)  Taken 11/28/2023 1400 by Daryll Epp, RN  Anti-Embolism Device Status: (Lovenox ) Other (Comment)  Taken 11/28/2023 1200 by Daryll Epp, RN  Anti-Embolism Device Status: (Lovenox ) Other (Comment)  Taken 11/28/2023 1000 by Daryll Epp, RN  Anti-Embolism Device Status: (Lovenox ) Other (Comment)  Taken 11/28/2023 0800 by Daryll Epp, RN  Anti-Embolism Device Status: (Lovenox ) Other (Comment)  Goal: Optimal Comfort and Wellbeing  Outcome: Progressing  Goal: Readiness for Transition of Care  Outcome: Progressing  Goal: Rounds/Family Conference  Outcome: Progressing     Problem: Bowel Disease, Inflammatory (Ulcerative Colitis or Crohn's Disease)  Goal: Optimal Adaptation to Chronic Illness  Outcome: Progressing  Goal: Diarrhea Symptom Relief  Outcome: Progressing  Goal: Absence of Infection Signs and Symptoms  Outcome: Progressing  Goal: Optimal Nutrition Delivery  Outcome: Progressing  Goal: Optimal Pain Control and Function  Outcome: Progressing

## 2023-11-28 NOTE — Unmapped (Addendum)
 Patient drinking bowel prep little at a time as tolerated. Encouragement provided throughout night. Medicated with oxycodone  and dilaudid  PRN for pain to the abdomen along with compazine  and zofran  PRN for nausea with relief.  Resting comfortably in bed at this time. Plan of care ongoing.     Problem: Adult Inpatient Plan of Care  Goal: Plan of Care Review  Outcome: Progressing  Flowsheets (Taken 11/28/2023 0145)  Progress: improving  Plan of Care Reviewed With: patient  Goal: Patient-Specific Goal (Individualized)  Outcome: Progressing  Goal: Absence of Hospital-Acquired Illness or Injury  Outcome: Progressing  Intervention: Identify and Manage Fall Risk  Flowsheets (Taken 11/28/2023 0145)  Safety Interventions:   environmental modification   fall reduction program maintained   low bed   lighting adjusted for tasks/safety   nonskid shoes/slippers when out of bed  Intervention: Prevent Skin Injury  Flowsheets (Taken 11/28/2023 0145)  Skin Protection: incontinence pads utilized  Intervention: Prevent and Manage VTE (Venous Thromboembolism) Risk  Recent Flowsheet Documentation  Taken 11/28/2023 0000 by Ervin Heath, RN  Anti-Embolism Device Status: (lovenox  sq) Other (Comment)  Taken 11/27/2023 2200 by Ervin Heath, RN  Anti-Embolism Device Status: (lovenox  sq) Other (Comment)  Taken 11/27/2023 2000 by Ervin Heath, RN  Anti-Embolism Device Status: (lovenox  sq) Other (Comment)  Intervention: Prevent Infection  Flowsheets (Taken 11/28/2023 0145)  Infection Prevention: rest/sleep promoted  Goal: Optimal Comfort and Wellbeing  Outcome: Progressing  Intervention: Monitor Pain and Promote Comfort  Flowsheets (Taken 11/28/2023 0145)  Pain Management Interventions:   quiet environment facilitated   relaxation techniques promoted  Intervention: Provide Person-Centered Care  Flowsheets (Taken 11/28/2023 0145)  Trust Relationship/Rapport:   care explained   questions answered   questions encouraged   thoughts/feelings acknowledged  Goal: Readiness for Transition of Care  Outcome: Progressing  Goal: Rounds/Family Conference  Outcome: Progressing     Problem: Bowel Disease, Inflammatory (Ulcerative Colitis or Crohn's Disease)  Goal: Optimal Adaptation to Chronic Illness  Outcome: Progressing  Goal: Diarrhea Symptom Relief  Outcome: Progressing  Goal: Absence of Infection Signs and Symptoms  Outcome: Progressing  Goal: Optimal Nutrition Delivery  Outcome: Progressing  Goal: Optimal Pain Control and Function  Outcome: Progressing  Intervention: Prevent or Manage Pain  Recent Flowsheet Documentation  Taken 11/28/2023 0145 by Ervin Heath, RN  Pain Management Interventions:   quiet environment facilitated   relaxation techniques promoted

## 2023-11-28 NOTE — Unmapped (Signed)
 Internal Medicine (MEDW) Progress Note    Assessment & Plan:   Jerome Opdyke. is a 48 y.o. male whose presentation is complicated by Crohn's disease s/p hemicolectomy, COPD, bipolar disorder that presented to [facility] with abdominal pain, nausea, vomiting, and diarrhea with concern for Crohn's flare.     Principal Problem:    Exacerbation of Crohn's disease  Active Problems:    Crohn's disease    Abdominal pain    Loose stools    Bipolar 1 disorder    COPD (chronic obstructive pulmonary disease)    Active Problems    Abdominal Pain, N/V/D - c/f Crohn's Flare - Hx of CD s/p Hemicolectomy  Presents with abdominal pain, nausea and vomiting, and diarrhea, reports his symptoms have not changed since most recent discharge from [facility] on 4/20 for the same. Currently on Humira  weekly for CD. CT on 4/19 with new multifocal bowel wall thickening and enhancement in the RLQ and sigmoid suspicious for colitis. Stool calprotectin elevated at 717. C diff and GIPP negative. ESR/CRP here negative. Given negative infectious workup, concern for Crohn's flare with other lab and imaging findings. GI planning for colonoscopy once bowel prep is complete. Patient has difficulty with the bowel prep, most recent colonoscopies in 2020 and 2021 were unable to reach ileocolonic anastomosis due to poor prep and colonic anatomy. Bowel prep here is complicated by patient having nausea and vomiting and also being on opioid pain medications. KUB 4/25 with mild colonic dilation, negative for small bowel obstruction. Will continue prep over weekend, can consider senna if needed. Enema unlikely to help per GI.  - GI planning for colonoscopy on 4/28  - Continue GoLytely  bowel prep  - Full liquid diet, resume clear liquid diet on 4/27  - Sch Tylenol , prn oxycodone  5-10 panel, dilaudid  for pain  - Compazine , zofran  prn for nausea  - DVT ppx given high risk for clots with IBD  - Daily CMP, CRP    COPD Exacerbation  Reports increased cough and sputum production x 1 week. Denies history of COPD exacerbations. Will obtain CXR and treat for COPD exacerbation.  - Continue Symbicort   - CXR ordered  - Start prednisone  40 mg x 5 days (4/26 - 4/30)  - Start Azithromycin  x 3 days (4/26 - 4/28)    Microcytic Anemia - Hx of Iron  Deficiency Anemia  Hgb low on admission 12.1. Iron  studies last checked 04/2023. Iron  studies repeated here, ferritin 15.2, iron  78. 1000 mg iron  deficit by the Ganzoni equation.  - S/p IV dextran on 4/24  - Daily CBC    Chronic Problems    Bipolar disorder: Continue Vraylar  and Seroquel    GERD: Continue home PPI    Daily Checklist:  Diet: Full Liquid Diet, Clear liquid diet at midnight  DVT PPx: Lovenox  40mg  q24h  Electrolytes: No Repletion Needed  Code Status: Full Code  Dispo:  Pending colonoscopy     Team Contact Information:   Primary Team: Internal Medicine (MEDW)  Primary Resident: Mariette Shorts, DO  Resident's Pager: [phone] (Gen MedW Intern - Tower)    Interval History:     No acute events overnight. States he is having bowel movements and they are becoming clear. Also endorses a cough with increased sputum production for the last week. Denies shortness of breath or history of COPD exacerbations.     Objective:   Temp:  [36.5 ??C (97.7 ??F)-36.8 ??C (98.2 ??F)] 36.8 ??C (98.2 ??F)  Pulse:  [62-68] 66  Resp:  [  16-18] 18  BP: (99-119)/(70-80) 119/80  SpO2:  [95 %-99 %] 95 %    Gen: NAD, converses appropriately  HENT: atraumatic, normocephalic  Heart: RRR  Lungs: Scattered wheezing and rhonchi  Abdomen: soft, mildly distended, ventral hernia present but soft and reducible, non-tender to palpation  Extremities: No edema    Labs:  Lab Results   Component Value Date    WBC 4.4 11/28/2023    HGB 13.7 11/28/2023    HCT 42.1 11/28/2023    PLT 178 11/28/2023     Lab Results   Component Value Date    NA 144 11/28/2023    K 3.6 11/28/2023    CL 111 (H) 11/28/2023    CO2 27.0 11/28/2023    BUN 5 (L) 11/28/2023    CREATININE 0.93 11/28/2023    GLU 75 11/28/2023    CALCIUM  8.8 11/28/2023    MG 1.6 11/28/2023    PHOS 2.8 11/28/2023     Lab Results   Component Value Date    BILITOT 0.4 11/28/2023    BILIDIR 0.20 04/26/2023    PROT 6.1 11/28/2023    ALBUMIN 3.2 (L) 11/28/2023    ALT 9 (L) 11/28/2023    AST 19 11/28/2023    ALKPHOS 148 (H) 11/28/2023    GGT 57 01/05/2013     Lab Results   Component Value Date    PT 11.3 10/18/2023    INR 0.99 10/18/2023    APTT 22.4 (L) 10/18/2023      Latest Reference Range & Units 11/21/23 19:06   Calprotectin, Stool <50.0 (Normal) mcg/g 717 (H)   (H): Data is abnormally high     Latest Reference Range & Units 11/26/23 05:19 11/27/23 04:05   CRP <=10.0 mg/L <5.0 <5.0     GIPP, C Diff 4/19: negative    Imaging:  4/25 KUB  Mild colonic dilatation, similar to prior CT. There is no small bowel or gaseous distention. Lucency in the region of the central diaphragm likely reflects the bowel loop which is known to be opposed to the ventral abdominal wall. No evidence of pneumoperitoneum. Lung bases are clear. No acute osseous abnormality.    4/19 CT Abdomen Pelvis  -New multifocal bowel wall thickening and hyperenhancement within the right lower quadrant large bowel and the sigmoid colon suspicious for colitis, likely secondary to Crohn's disease.  -Mildly increased gas and fluid-filled distention of the distal sigmoid colon and rectum which can be seen in a diarrheal state.  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    Mariette Shorts, DO  PM&R, PGY-1

## 2023-11-28 NOTE — Unmapped (Signed)
 VENOUS ACCESS ULTRASOUND PROCEDURE NOTE    Indications:   Poor venous access.    The Venous Access Team has assessed this patient for the placement of a PIV. Ultrasound guidance was necessary to obtain access.     Procedure Details:  Identity of the patient was confirmed via name, medical record number and date of birth. The availability of the correct equipment was verified.    The vein was identified for ultrasound catheter insertion.  Field was prepared with necessary supplies and equipment.  Probe cover and sterile gel utilized.  Insertion site was prepped with chlorhexidine solution and allowed to dry.  The catheter extension was primed with normal saline. A(n) 20 gauge 1.75 catheter was placed in the R Forearm with 1attempt(s). See LDA for additional details.    Catheter aspirated, 1 mL blood return present. The catheter was then flushed with 10 mL of normal saline. Insertion site cleansed, and dressing applied per manufacturer guidelines. The catheter was inserted with difficulty due to poor vasculature by Melodye Ped, RN.    Primary RN was notified.     Thank you,     Melodye Ped, RN Venous Access Team   [phone]     Workup / Procedure Time:  30 minutes    See images below:

## 2023-11-28 NOTE — Unmapped (Signed)
 PHYSICAL THERAPY  Evaluation (11/28/23 1332)          Patient Name:  Jerome Kennedy.       Medical Record Number: [id number]   Date of Birth: [birth year]  Sex: Male        Post-Discharge Physical Therapy Recommendations:  PT Post Acute Discharge Recommendations: Skilled PT services NOT indicated   Equipment Recommendation  PT DME Recommendations: Rollator          Treatment Diagnosis: Generalized muscle weakness        Activity Tolerance: Tolerated treatment well     ASSESSMENT  Problem List: Decreased endurance      Assessment : Jerome Kennedy. is a 48 y.o. male whose presentation is complicated by Crohn's disease, hemicolectomy, COPD, bipolar disorder that presented to [facility] with abdominal discomfort, nausea and vomiting, and diarrhea concerning for Crohn's flare.       Pt performs all functional mobility assessed in session mod I/independently without assistive device. Pt with full participation in evaluation. He reports he is currently performing at functional baseline, but would like a rollator to help improve community ambulation for days when his stomach pain might be exacerbated upon discharge. He denies having any concerns about his ability to navigate his home environment. Given current functional mobility, skilled PT services are no longer indicated at this time (DME: rollator). Please see Today's Intervention and Daily Intervention sections for detailed current mobility status and additional session detail.  After a review of the personal factors, comorbidities, clinical presentation, and examination of the number of affected body systems, the patient presents as a low complexity case.      Today's Interventions: Balance activities, Endurance activities, Gait training, Patient/Family/Caregiver Education, Therapeutic activity  Today's Interventions: Mobility assessment completed.  Therex/Theract: bed mobiltiy, transfers, sitting/standing balance. Gait: endurance.  Pt Education: POC, anticipated course of cont recovery, importance of only mobilizing with staff assist at this time to maximize safety, use of assistive devices when feeling weak upon discharge, activity pacing, importance of keeping blinds open during the day, and discharge recs.  Patient in agreement with plan.  All questions answered.  Patient verbalized understanding and stated intention to comply with recommendations.     Personal Factors/Comorbidities Present: 3+   Examination of Body System: Musculoskeletal, Cardiovascular, Pulmonary, Neurological, Integumentary  Clinical Presentation: Stable    Clinical Decision Making: Low        PLAN  Planned Frequency of Treatment: Plan of Care Initiated: 11/28/23  D/C Services Weekly Frequency: D/C Services        Planned Interventions:       Goals:   Patient and Family Goals: none stated        Prognosis:  Good  Positive Indicators: PLOF, CLOF  Barriers to Discharge: None     SUBJECTIVE  Communication Preference: Verbal     Patient reports: Pt agreeable to PT eval  Pain Comments: Pt denies pain        Prior Functional Status: Pt reports being independent with all functional mobility PTA. Pt does not currently drive. He denies having any falls in the past 6 months.  Living Situation  Living Environment: Apartment  Lives With: Alone  Home Living: One level home, Stairs to enter with rails (second floor apartment)  Rail placement (outside): Bilateral rails in reach  Number of Stairs to Enter (outside): 14  Caregiver Identified?: No  Caregiver Identified?: No   Equipment available at home: Straight cane        Past Medical  History:   Diagnosis Date    Anemia 04/05/2022    Anxiety     Avascular necrosis of femur head, right 2011    Bipolar disorder 2013    Follows the Psychiatry    Cannabis use disorder     COPD (chronic obstructive pulmonary disease) 2022    Crohn's disease 1990    Depression 2007    Severe depressive episode with psychotic symptoms    DVT (deep venous thrombosis) 02/08/2010    DVT of superior vena cava, left brachiocephalic, 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 Motivated to quit.    Substance Use Topics    Alcohol use: No     Alcohol/week: 0.0 standard drinks of alcohol       Past Surgical History:   Procedure Laterality Date    CHOLECYSTECTOMY      COLON SURGERY      partial resection    HERNIA REPAIR      PR ARTHRP INTERCARPAL/CARP/MTCRPL JT INTERPOSITION Left 08/07/2022    Procedure: INTERPOSIT ARTHROPLASTY-INTERCARPAL/CARPOMETACAR;  Surgeon: Adonna Hoots, MD;  Location: MAIN OR [facility];  Service: Orthopedics    PR CLOSE ENTEROSTOMY,RESEC+COLOREC ANAS Midline 11/01/2019    Procedure: CLO ENTEROSTOMY; W/RESECT Lethaniel Rave;  Surgeon: Eusebio High, MD;  Location: MAIN OR [place];  Service: Gastrointestinal    PR COLONOSCOPY FLX DX W/COLLJ SPEC WHEN PFRMD Left 01/07/2013    Procedure: COLONOSCOPY, FLEXIBLE, PROXIMAL TO SPLENIC FLEXURE; DIAGNOSTIC, W/WO COLLECTION SPECIMEN BY BRUSH OR WASH;  Surgeon: Lizbeth Right, MD;  Location: GI PROCEDURES MEMORIAL [facility];  Service: Gastroenterology    PR COLONOSCOPY FLX DX W/COLLJ SPEC WHEN PFRMD  03/09/2015    Procedure: COLONOSCOPY, FLEXIBLE, PROXIMAL TO SPLENIC FLEXURE; DIAGNOSTIC, W/WO COLLECTION SPECIMEN BY BRUSH OR WASH;  Surgeon: Delene Feinstein, MD;  Location: GI PROCEDURES [facility];  Service: Gastroenterology    PR COLONOSCOPY FLX DX W/COLLJ SPEC WHEN PFRMD N/A 10/28/2019    Procedure: COLONOSCOPY, FLEXIBLE, PROXIMAL TO SPLENIC FLEXURE; DIAGNOSTIC, W/WO COLLECTION SPECIMEN BY BRUSH OR WASH;  Surgeon: Sarajane Cumming, MD; Location: GI PROCEDURES MEMORIAL [facility];  Service: Gastroenterology    PR COLONOSCOPY FLX DX W/COLLJ SPEC WHEN PFRMD N/A 10/01/2022    Procedure: COLONOSCOPY, FLEXIBLE, PROXIMAL TO SPLENIC FLEXURE; DIAGNOSTIC, W/WO COLLECTION SPECIMEN BY BRUSH OR WASH;  Surgeon: Rana Burr, MD;  Location: GI PROCEDURES [facility];  Service: Gastroenterology    PR EXPLORATORY OF ABDOMEN Midline 09/02/2019    Procedure: EXPLORATORY LAPAROTOMY, EXPLORATORY CELIOTOMY WITH OR WITHOUT BIOPSY(S);  Surgeon: Eusebio High, MD;  Location: MAIN OR [facility];  Service: Gastrointestinal    PR EXPLORATORY OF ABDOMEN Midline 11/01/2019    Procedure: EXPLORATORY LAPAROTOMY, EXPLORATORY CELIOTOMY WITH OR WITHOUT BIOPSY(S);  Surgeon: Eusebio High, MD;  Location: MAIN OR [place];  Service: Gastrointestinal    PR FIX FINGER,VOLAR PLATE,I-P JT Left 08/07/2022    Procedure: REPAIR AND RECONSTRUCTION, FINGER,  VOLAR PLATE, INTERPHALANGEAL JOINT;  Surgeon: Adonna Hoots, MD;  Location: MAIN OR [facility];  Service: Orthopedics    PR FREEING BOWEL ADHESION,ENTEROLYSIS N/A 11/01/2019    Procedure: Enterolysis (Separt Proc);  Surgeon: Eusebio High, MD;  Location: MAIN OR [facility];  Service: Gastrointestinal    PR FUSION MC-P JT Left 08/27/2023    Procedure: ARTHRODESIS, METACARPOPHALANGEAL JOINT, WITH OR WITHOUT INTERNAL FIXATION;  Surgeon: Adonna Hoots, MD;  Location: OR [facility];  Service: Orthopedics    PR ILEOSCOPY THRU STOMA,BIOPSY N/A 10/14/2019    Procedure: Erby Hatcher; Lamarr Pilling 1/MX;  Surgeon: Loel Ring, MD;  Location: GI PROCEDURES [facility];  Service: Gastroenterology    PR PART REMOVAL COLON W COLOSTOMY Midline 09/02/2019    Procedure: COLECTOMY, PARTIAL; WITH SKIN LEVEL CECOSTOMY OR COLOSTOMY;  Surgeon: Eusebio High, MD;  Location: MAIN OR [facility];  Service: Gastrointestinal    PR REPAIR INCISIONAL HERNIA,REDUCIBLE Midline 09/02/2019    Procedure: REPAIR INIT INCISIONAL OR VENTRAL HERNIA; REDUCIBLE;  Surgeon: Caro Christmas, MD;  Location: MAIN OR [place];  Service: Trauma    PR UPPER GI ENDOSCOPY,BIOPSY N/A 01/07/2013    Procedure: UGI ENDOSCOPY; WITH BIOPSY, SINGLE OR MULTIPLE;  Surgeon: Lizbeth Right, MD;  [facility];  Service: Gastroenterology    PR UPPER GI ENDOSCOPY,BIOPSY N/A 10/14/2019    Procedure: UGI ENDOSCOPY; WITH BIOPSY, SINGLE OR MULTIPLE;  Surgeon: Loel Ring, MD;  Location: GI PROCEDURES [facility];  Service: Gastroenterology    PR UPPER GI ENDOSCOPY,DIAGNOSIS N/A 10/01/2022    Procedure: UGI ENDO, INCLUDE ESOPHAGUS, STOMACH, & DUODENUM &/OR JEJUNUM; DX W/WO COLLECTION SPECIMN, BY BRUSH OR WASH;  Surgeon: Rana Burr, MD;  Location: GI PROCEDURES [facility];  Service: Gastroenterology             Family History   Problem Relation Age of Onset    Breast cancer Mother     Diabetes Maternal Aunt     Colon cancer Maternal Grandmother     Prostate cancer Maternal Grandfather     Cancer Paternal Grandmother     Crohn's disease Neg Hx         Allergies: Morphine  and Toradol [ketorolac]                  Objective Findings  Precautions / Restrictions  Precautions: Falls precautions  Weight Bearing Status: Non-applicable  Required Braces or Orthoses: Non-applicable        Equipment / Environment: Vascular access (PIV, TLC, Port-a-cath, PICC)     Vitals/Orthostatics : VSS per Epic; pt in NAD     Cognition: WFL  Orientation: Oriented x4  Visual/Perception:  (has prescription glasses, does not wear all the time)  Hearing: No deficit identified     Skin Inspection: Intact where visualized     Upper Extremities  UE ROM: Right WFL, Left WFL  UE Strength: Right WFL, Left WFL    Lower Extremities  LE ROM: Right WFL, Left WFL  LE Strength: Right WFL, Left WFL          Sensation: WFL  Posture: WFL    Static Sitting-Level of Assistance: Independent  Dynamic Sitting-Level of Assistance: Independent    Static Standing-Level of Assistance: Independent  Dynamic Standing - Level of Assistance: Independent      Bed Mobility: Supine to Sit  Supine to Sit assistance level: Independent  Bed Mobility comments: Pt performed supine <> sit wih HOB flat independently     Transfers: Sit to Stand  Sit to Stand assistance level: Independent  Transfer comments: Pt performed sit <> stand from low EOB with no AD indepedently      Gait Level of Assistance: Modified independent, requires aide device or extra time  Gait Assistive Device: Other (Comment) (none)  Gait Distance Ambulated (ft): 100 ft  Skilled Treatment Performed: Pt ambulates 148ft in room with no AD mod I for balance and safety. Pt declined hallway ambulation this date. Demonstrates appropriately slow reciprocal gait pattern with wide BOS. Pt reports this as his baseline mobility.     Stairs: Not formally assessed due to pt declining out of room mobility. Pt performed 14 standing marches and mini squats to simulate stair navigation. Performs mod I with use of straight cane            Endurance: adequate    Patient at end of session: All needs in reach, In bed, Notified Nurse    Physical Therapy Session Duration  PT Individual [mins]: 21          AM-PAC-6 click  Help currently need turning over In bed?: None - Modified Independent/Independent  Help currently needed sitting down/standing up from chair with arms? : None - Modified Independent/Independent  Help currently needed moving from supine to sitting on edge of bed?: None - Modified Independent/Independent  Help currently needed moving to and from bed from wheelchair?: None - Modified Independent/Independent  Help currently needed walking in a hospital room?: None - Modified Independent/Independent  Help currently needed climbing 3-5 steps with railing?: None - Modified Independent/Independent    Basic Mobility Score 6 click: 24    6 click Score (in points): % of Functional Impairment, Limitation, Restriction  6: 100% impaired, limited, restricted  7-8: At least 80%, but less than 100% impaired, limited restricted  9-13: At least 60%, but less than 80% impaired, limited restricted  14-19: At least 40%, but less than 60% impaired, limited restricted  20-22: At least 20%, but less than 40% impaired, limited restricted  23: At least 1%, but less than 20% impaired, limited restricted  24: 0% impaired, limited restricted    'AM-PAC' forms are Copyright protected by The Trustees of [facility]         I attest that I have reviewed the above information.  Signed: Aloysius Artist, PT  Filed 11/28/2023

## 2023-11-29 LAB — COMPREHENSIVE METABOLIC PANEL
ALBUMIN: 3 g/dL — ABNORMAL LOW (ref 3.4–5.0)
ALKALINE PHOSPHATASE: 131 U/L — ABNORMAL HIGH (ref 46–116)
ALT (SGPT): <7 U/L — ABNORMAL LOW (ref 10–49)
ANION GAP: 12 mmol/L (ref 5–14)
AST (SGOT): 13 U/L (ref <=34)
BILIRUBIN TOTAL: 0.3 mg/dL (ref 0.3–1.2)
BLOOD UREA NITROGEN: <5 mg/dL — ABNORMAL LOW (ref 9–23)
CALCIUM: 7.7 mg/dL — ABNORMAL LOW (ref 8.7–10.4)
CHLORIDE: 107 mmol/L (ref 98–107)
CO2: 24 mmol/L (ref 20.0–31.0)
CREATININE: 0.89 mg/dL (ref 0.73–1.18)
EGFR CKD-EPI (2021) MALE: >90 mL/min/1.73m2 (ref >=60)
GLUCOSE RANDOM: 100 mg/dL (ref 70–179)
POTASSIUM: 3.5 mmol/L (ref 3.4–4.8)
PROTEIN TOTAL: 5.8 g/dL (ref 5.7–8.2)
SODIUM: 143 mmol/L (ref 135–145)

## 2023-11-29 LAB — CBC W/ AUTO DIFF
BASOPHILS ABSOLUTE COUNT: 0 10*9/L (ref 0.0–0.1)
BASOPHILS RELATIVE PERCENT: 0.5 %
EOSINOPHILS ABSOLUTE COUNT: 0.1 10*9/L (ref 0.0–0.5)
EOSINOPHILS RELATIVE PERCENT: 1.2 %
HEMATOCRIT: 37.5 % — ABNORMAL LOW (ref 39.0–48.0)
HEMOGLOBIN: 12.4 g/dL — ABNORMAL LOW (ref 12.9–16.5)
LYMPHOCYTES ABSOLUTE COUNT: 1.7 10*9/L (ref 1.1–3.6)
LYMPHOCYTES RELATIVE PERCENT: 32.1 %
MEAN CORPUSCULAR HEMOGLOBIN CONC: 33.1 g/dL (ref 32.0–36.0)
MEAN CORPUSCULAR HEMOGLOBIN: 26.8 pg (ref 25.9–32.4)
MEAN CORPUSCULAR VOLUME: 80.8 fL (ref 77.6–95.7)
MEAN PLATELET VOLUME: 8.2 fL (ref 6.8–10.7)
MONOCYTES ABSOLUTE COUNT: 0.5 10*9/L (ref 0.3–0.8)
MONOCYTES RELATIVE PERCENT: 8.6 %
NEUTROPHILS ABSOLUTE COUNT: 3.1 10*9/L (ref 1.8–7.8)
NEUTROPHILS RELATIVE PERCENT: 57.6 %
PLATELET COUNT: 204 10*9/L (ref 150–450)
RED BLOOD CELL COUNT: 4.64 10*12/L (ref 4.26–5.60)
RED CELL DISTRIBUTION WIDTH: 14.8 % (ref 12.2–15.2)
WBC ADJUSTED: 5.3 10*9/L (ref 3.6–11.2)

## 2023-11-29 LAB — PHOSPHORUS: PHOSPHORUS: 1.8 mg/dL — ABNORMAL LOW (ref 2.4–5.1)

## 2023-11-29 LAB — C-REACTIVE PROTEIN: C-REACTIVE PROTEIN: 19.9 mg/L — ABNORMAL HIGH (ref <=10.0)

## 2023-11-29 LAB — MAGNESIUM: MAGNESIUM: 1.5 mg/dL — ABNORMAL LOW (ref 1.6–2.6)

## 2023-11-29 MED ADMIN — hydrOXYzine (ATARAX) tablet 25 mg: 25 mg | ORAL

## 2023-11-29 MED ADMIN — ondansetron (ZOFRAN) injection 4 mg: 4 mg | INTRAVENOUS | @ 18:00:00

## 2023-11-29 MED ADMIN — ondansetron (ZOFRAN) injection 4 mg: 4 mg | INTRAVENOUS

## 2023-11-29 MED ADMIN — pantoprazole (Protonix) EC tablet 40 mg: 40 mg | ORAL | @ 13:00:00

## 2023-11-29 MED ADMIN — HYDROmorphone (PF) (DILAUDID) injection Syrg 0.5 mg: .5 mg | INTRAVENOUS | @ 07:00:00 | Stop: 2023-12-09

## 2023-11-29 MED ADMIN — nicotine (NICODERM CQ) 14 mg/24 hr patch 1 patch: 1 | TRANSDERMAL | @ 13:00:00

## 2023-11-29 MED ADMIN — cholecalciferol (vitamin D3 25 mcg (1,000 units)) tablet 25 mcg: 25 ug | ORAL | @ 13:00:00

## 2023-11-29 MED ADMIN — fluticasone furoate-vilanterol (BREO ELLIPTA) 100-25 mcg/dose inhaler 1 puff: 1 | RESPIRATORY_TRACT | @ 13:00:00

## 2023-11-29 MED ADMIN — acetaminophen (TYLENOL) tablet 1,000 mg: 1000 mg | ORAL | @ 16:00:00

## 2023-11-29 MED ADMIN — QUEtiapine (SEROQUEL) tablet 100 mg: 100 mg | ORAL | @ 01:00:00

## 2023-11-29 MED ADMIN — HYDROmorphone (PF) (DILAUDID) injection Syrg 0.5 mg: .5 mg | INTRAVENOUS | @ 01:00:00 | Stop: 2023-12-09

## 2023-11-29 MED ADMIN — enoxaparin (LOVENOX) syringe 40 mg: 40 mg | SUBCUTANEOUS | @ 01:00:00

## 2023-11-29 MED ADMIN — HYDROmorphone (PF) (DILAUDID) injection Syrg 0.5 mg: .5 mg | INTRAVENOUS | @ 18:00:00 | Stop: 2023-12-09

## 2023-11-29 MED ADMIN — prochlorperazine (COMPAZINE) injection 5 mg: 5 mg | INTRAVENOUS | @ 13:00:00

## 2023-11-29 MED ADMIN — cariprazine (VRAYLAR) capsule 1.5 mg: 1.5 mg | ORAL | @ 13:00:00

## 2023-11-29 MED ADMIN — predniSONE (DELTASONE) tablet 40 mg: 40 mg | ORAL | @ 13:00:00 | Stop: 2023-12-03

## 2023-11-29 MED ADMIN — azithromycin (ZITHROMAX) tablet 250 mg: 250 mg | ORAL | @ 13:00:00 | Stop: 2023-12-03

## 2023-11-29 MED ADMIN — HYDROmorphone (PF) (DILAUDID) injection Syrg 0.5 mg: .5 mg | INTRAVENOUS | @ 23:00:00 | Stop: 2023-12-09

## 2023-11-29 MED ADMIN — prochlorperazine (COMPAZINE) injection 5 mg: 5 mg | INTRAVENOUS | @ 07:00:00

## 2023-11-29 MED ADMIN — melatonin tablet 3 mg: 3 mg | ORAL | @ 23:00:00

## 2023-11-29 MED ADMIN — prochlorperazine (COMPAZINE) injection 5 mg: 5 mg | INTRAVENOUS | @ 23:00:00

## 2023-11-29 MED ADMIN — acetaminophen (TYLENOL) tablet 1,000 mg: 1000 mg | ORAL | @ 23:00:00

## 2023-11-29 MED ADMIN — potassium phosphate 30 mmol in sodium chloride (NS) 0.9 % 250 mL infusion: 30 mmol | INTRAVENOUS | @ 18:00:00 | Stop: 2023-11-29

## 2023-11-29 MED ADMIN — magnesium sulfate 2gm/50mL IVPB: 2 g | INTRAVENOUS | @ 16:00:00 | Stop: 2023-11-29

## 2023-11-29 MED ADMIN — HYDROmorphone (PF) (DILAUDID) injection Syrg 0.5 mg: .5 mg | INTRAVENOUS | @ 13:00:00 | Stop: 2023-12-09

## 2023-11-29 NOTE — Unmapped (Signed)
 [facility]   Progress Note         Assessment and Recommendations:   Jerome Allaire. 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 The patient is seen in consultation at the request of Brady Cagey, MD ([facility] (MD[facility])) for Crohn's disease.    Patient is prepping well. He needs to continue prepping today as much as possible. Planning for CLN tomorrow.    Recommendations:  - Cont chemical deep vein thrombosis prophylaxis (patient is at high risk for clots in setting of IBD flare)  - Order daily CRP, CMP  - Colonoscopy, as per below    GI Pre-Procedure Checklist  Procedure: Colonoscopy  Anticipated Date of Procedure: 4/28  Anticoagulants/Antiplatelets: Not Applicable  Anesthesia Concerns: Persistent vomiting  Diet: Order clear liquid and make NPO at 12AM (midnight) day of procedure  Prep: Give 4L Golytely  bowel prep today. Order bowel prep using MED GI PROCEDURES PREP order-set. You must select the appropriate prep, this is not auto-selected. If Golytely  is not available from pharmacy, please give Gatorade+Miralax  prep. The patient is adequately prepped when their stool is clear and yellow, like urine. Continue to order bowel prep until the patients stools are clear. The patient can continue drinking prep past midnight if not clear, but must be strictly NPO of all oral intake for at least 2 hours before procedure.    Issues Impacting Complexity of Management:  -None    Recommendations discussed with the patient's primary team. We will continue to follow along with you.    Subjective:   - Patient is prepping well, stools clearing but not all the way clear.    Objective:   Temp:  [36.6 ??C (97.9 ??F)-36.8 ??C (98.2 ??F)] 36.6 ??C (97.9 ??F)  Pulse:  [66-74] 73  Resp:  [18-20] 20  BP: (95-142)/(58-95) 142/95  SpO2:  [93 %-100 %] 99 %    Gen: WDWN male in NAD, answers questions appropriately  Abdomen: abdominal hernia. NT/ND  Extremities: No edema in the BLEs    Pertinent Labs & Studies:  -I have reviewed the patient's labs from 11/29/23 which show stable Hgb, stable renal function (SCr, electrolytes), and stable LFTs

## 2023-11-29 NOTE — Unmapped (Signed)
 Patient alert and oriented x 4 today.  Vitals stable.  Patient receiving Dilauded IV PRN for abdominal pain which has been effective.  He also has had some nausea and has alternated Zofran  and Compazine .  No vomiting this shift.  He has been drinking the golytely  bowel prep and his stool is watery and is lightening up but still a light brown color so he continues to drink.  Patient receiving IV electrolyte replacement today.  No other concerns currently.  Will monitor.     Problem: Adult Inpatient Plan of Care  Goal: Plan of Care Review  Outcome: Progressing  Goal: Patient-Specific Goal (Individualized)  Outcome: Progressing  Flowsheets (Taken 11/29/2023 1434)  Patient/Family-Specific Goals (Include Timeframe): Patient will have successful bowel prep  Individualized Care Needs: monitor vs/labs, pain and nausea control, bowel prep  Anxieties, Fears or Concerns: pain  Goal: Absence of Hospital-Acquired Illness or Injury  Outcome: Progressing  Intervention: Identify and Manage Fall Risk  Recent Flowsheet Documentation  Taken 11/29/2023 0800 by Thom Ollinger K, RN  Safety Interventions:   fall reduction program maintained   low bed   nonskid shoes/slippers when out of bed  Intervention: Prevent Skin Injury  Recent Flowsheet Documentation  Taken 11/29/2023 0800 by Treniyah Lynn K, RN  Skin Protection:   adhesive use limited   cleansing with dimethicone incontinence wipes  Intervention: Prevent Infection  Recent Flowsheet Documentation  Taken 11/29/2023 0800 by Shanelle Clontz K, RN  Infection Prevention:   environmental surveillance performed   hand hygiene promoted   personal protective equipment utilized   rest/sleep promoted  Goal: Optimal Comfort and Wellbeing  Outcome: Progressing  Goal: Readiness for Transition of Care  Outcome: Progressing  Goal: Rounds/Family Conference  Outcome: Progressing

## 2023-11-29 NOTE — Unmapped (Signed)
 Internal Medicine (MEDW) Progress Note    Assessment & Plan:   Jerome Kennedy. is a 48 y.o. male whose presentation is complicated by Crohn's disease s/p hemicolectomy, COPD, bipolar disorder that presented to [facility] with abdominal pain, nausea, vomiting, and diarrhea with concern for Crohn's flare.     Principal Problem:    Exacerbation of Crohn's disease  Active Problems:    Crohn's disease    Abdominal pain    Loose stools    Bipolar 1 disorder    COPD (chronic obstructive pulmonary disease)    Active Problems    Abdominal Pain, N/V/D - c/f Crohn's Flare - Hx of CD s/p Hemicolectomy  Presents with abdominal pain, nausea and vomiting, and diarrhea, reports his symptoms have not changed since most recent discharge from [facility] on 4/20 for the same. Currently on Humira  weekly for CD. CT on 4/19 with new multifocal bowel wall thickening and enhancement in the RLQ and sigmoid suspicious for colitis. Stool calprotectin elevated at 717. C diff and GIPP negative. ESR/CRP here negative. Given negative infectious workup, concern for Crohn's flare with other lab and imaging findings. GI planning for colonoscopy once bowel prep is complete. Patient has difficulty with the bowel prep, most recent colonoscopies in 2020 and 2021 were unable to reach ileocolonic anastomosis due to poor prep and colonic anatomy. CRP uptrending to 19.9. Bowel prep progressing, will continue today in preparation for CLN tomorrow.  - GI planning for EGD and colonoscopy on 4/28  - Continue GoLytely  bowel prep  - Clear liquid diet, NPO at MN  - Sch Tylenol , prn oxycodone  5-10 panel, dilaudid  for pain  - Compazine , zofran  prn for nausea  - DVT ppx given high risk for clots with IBD  - Daily CMP, CRP    COPD Exacerbation  Reports increased cough and sputum production x 1 week. Denies history of COPD exacerbations. CXR clear. Will treat for COPD exacerbation.  - Continue Breo Ellipta , duo nebs prn  - Continue prednisone  40 mg x 5 days (4/26 - 4/30)  - Continue Azithromycin  x 3 days (4/26 - 4/28)    Microcytic Anemia - Hx of Iron  Deficiency Anemia  Hgb low on admission 12.1. Iron  studies last checked 04/2023. Iron  studies repeated here, ferritin 15.2, iron  78. 1000 mg iron  deficit by the Ganzoni equation.  - S/p IV Dextran on 4/24  - Daily CBC    Chronic Problems    Bipolar disorder: Continue Vraylar  and Seroquel    GERD: Continue home PPI    Daily Checklist:  Diet: Clear Liquid Diet and NPO at MN  DVT PPx: Lovenox  40mg  q24h  Electrolytes: No Repletion Needed  Code Status: Full Code  Dispo:  Pending EGD/colonoscopy     Team Contact Information:   Primary Team: Internal Medicine (MEDW)  Primary Resident: Gerianne Kobs, DO; Evone Hoh, MS4  Resident's Pager: [phone] (Gen MedW Intern - Tower)    Interval History:     No acute events overnight. Continuing to have bowel movements, they are almost clear. States that his cough is improving with breo ellipta , prednisone , and azithromycin . Denies shortness of breath. States that his abdominal pain, nausea, and vomiting are improving.    Objective:   Temp:  [36.6 ??C (97.9 ??F)-36.8 ??C (98.2 ??F)] 36.6 ??C (97.9 ??F)  Pulse:  [66-74] 73  Resp:  [18-20] 20  BP: (95-142)/(58-95) 142/95  SpO2:  [93 %-100 %] 99 %    Gen: NAD, sitting on bedside commode, converses appropriately  HENT: atraumatic, normocephalic  Lungs: breathing comfortably on room air  Extremities: no edema    Labs:  Lab Results   Component Value Date    WBC 5.3 11/29/2023    HGB 12.4 (L) 11/29/2023    HCT 37.5 (L) 11/29/2023    PLT 204 11/29/2023     Lab Results   Component Value Date    NA 143 11/29/2023    K 3.5 11/29/2023    CL 107 11/29/2023    CO2 24.0 11/29/2023    BUN <5 (L) 11/29/2023    CREATININE 0.89 11/29/2023    GLU 100 11/29/2023    CALCIUM  7.7 (L) 11/29/2023    MG 1.5 (L) 11/29/2023    PHOS 1.8 (L) 11/29/2023     Lab Results   Component Value Date    BILITOT 0.3 11/29/2023    BILIDIR 0.20 04/26/2023    PROT 5.8 11/29/2023    ALBUMIN 3.0 (L) 11/29/2023    ALT <7 (L) 11/29/2023    AST 13 11/29/2023    ALKPHOS 131 (H) 11/29/2023    GGT 57 01/05/2013     Lab Results   Component Value Date    PT 11.3 10/18/2023    INR 0.99 10/18/2023    APTT 22.4 (L) 10/18/2023      Latest Reference Range & Units 11/21/23 19:06   Calprotectin, Stool <50.0 (Normal) mcg/g 717 (H)   (H): Data is abnormally high     Latest Reference Range & Units 11/26/23 05:19 11/27/23 04:05 11/28/23 04:05 11/29/23 04:16   CRP <=10.0 mg/L <5.0 <5.0 13.5 (H) 19.9 (H)   (H): Data is abnormally high    Imaging:  4/26 CXR  FINDINGS:   Bibasilar linear ectasis or scarring. Otherwise, lungs are clear. No pleural effusion or pneumothorax.   Cardiac silhouette is normal in size.       Katherine Li, MS4  [facility]    I attest that I have reviewed the medical student note and that the components of the history of the present illness, the physical exam, and the assessment and plan documented were performed by me or were performed in my presence by the student where I verified the documentation and performed (or re-performed) the exam and medical decision making. Lillee Mooneyhan A Drouin-Allaire, DO

## 2023-11-29 NOTE — Unmapped (Signed)
 A/Ox4 and on room air. Continues to have abdominal pain with nausea and intermittent vomiting. Mostly controlled with PRNS. Transitioned to clear liquids at midnight. No overnight issues.       Problem: Adult Inpatient Plan of Care  Goal: Plan of Care Review  11/29/2023 0718 by Eligah Grow, RN  Outcome: Ongoing - Unchanged  Flowsheets (Taken 11/29/2023 [phone])  Progress: no change  Plan of Care Reviewed With: patient  11/29/2023 0718 by Eligah Grow, RN  Outcome: Ongoing - Unchanged  Flowsheets (Taken 11/29/2023 [phone])  Progress: no change  Plan of Care Reviewed With: patient  Goal: Patient-Specific Goal (Individualized)  11/29/2023 0718 by Eligah Grow, RN  Outcome: Ongoing - Unchanged  11/29/2023 0718 by Eligah Grow, RN  Outcome: Ongoing - Unchanged  Goal: Absence of Hospital-Acquired Illness or Injury  11/29/2023 0718 by Eligah Grow, RN  Outcome: Ongoing - Unchanged  11/29/2023 0718 by Eligah Grow, RN  Outcome: Ongoing - Unchanged  Intervention: Identify and Manage Fall Risk  Recent Flowsheet Documentation  Taken 11/28/2023 2200 by Eligah Grow, RN  Safety Interventions:   environmental modification   fall reduction program maintained  Taken 11/28/2023 2000 by Eligah Grow, RN  Safety Interventions:   environmental modification   fall reduction program maintained  Intervention: Prevent and Manage VTE (Venous Thromboembolism) Risk  Recent Flowsheet Documentation  Taken 11/28/2023 2000 by Eligah Grow, RN  Anti-Embolism Device Status: (lovenox ) Other (Comment)  Intervention: Prevent Infection  Recent Flowsheet Documentation  Taken 11/28/2023 2000 by Eligah Grow, RN  Infection Prevention:   rest/sleep promoted   cohorting utilized  Goal: Optimal Comfort and Wellbeing  11/29/2023 0718 by Eligah Grow, RN  Outcome: Ongoing - Unchanged  11/29/2023 0718 by Eligah Grow, RN  Outcome: Ongoing - Unchanged  Goal: Readiness for Transition of Care  11/29/2023 0718 by Eligah Grow, RN  Outcome: Ongoing - Unchanged  11/29/2023 0718 by Eligah Grow, RN  Outcome: Ongoing - Unchanged  Goal: Rounds/Family Conference  11/29/2023 0718 by Eligah Grow, RN  Outcome: Ongoing - Unchanged  11/29/2023 0718 by Eligah Grow, RN  Outcome: Ongoing - Unchanged

## 2023-11-30 LAB — COMPREHENSIVE METABOLIC PANEL
ALBUMIN: 3.2 g/dL — ABNORMAL LOW (ref 3.4–5.0)
ALKALINE PHOSPHATASE: 145 U/L — ABNORMAL HIGH (ref 46–116)
ALT (SGPT): 8 U/L — ABNORMAL LOW (ref 10–49)
ANION GAP: 9 mmol/L (ref 5–14)
AST (SGOT): 14 U/L (ref <=34)
BILIRUBIN TOTAL: 0.4 mg/dL (ref 0.3–1.2)
BLOOD UREA NITROGEN: <5 mg/dL — ABNORMAL LOW (ref 9–23)
CALCIUM: 8 mg/dL — ABNORMAL LOW (ref 8.7–10.4)
CHLORIDE: 109 mmol/L — ABNORMAL HIGH (ref 98–107)
CO2: 26 mmol/L (ref 20.0–31.0)
CREATININE: 0.91 mg/dL (ref 0.73–1.18)
EGFR CKD-EPI (2021) MALE: >90 mL/min/1.73m2 (ref >=60)
GLUCOSE RANDOM: 71 mg/dL (ref 70–179)
POTASSIUM: 3.6 mmol/L (ref 3.4–4.8)
PROTEIN TOTAL: 6.2 g/dL (ref 5.7–8.2)
SODIUM: 144 mmol/L (ref 135–145)

## 2023-11-30 LAB — MAGNESIUM: MAGNESIUM: 1.8 mg/dL (ref 1.6–2.6)

## 2023-11-30 LAB — PHOSPHORUS: PHOSPHORUS: 1.8 mg/dL — ABNORMAL LOW (ref 2.4–5.1)

## 2023-11-30 LAB — C-REACTIVE PROTEIN: C-REACTIVE PROTEIN: 12.8 mg/L — ABNORMAL HIGH (ref <=10.0)

## 2023-11-30 MED ORDER — HYDROXYZINE HCL 25 MG TABLET
ORAL_TABLET | Freq: Four times a day (QID) | ORAL | 0 refills | 30.00000 days | PRN
Start: 2023-11-30 — End: ?

## 2023-11-30 MED ORDER — VENTOLIN HFA 90 MCG/ACTUATION AEROSOL INHALER
Freq: Four times a day (QID) | RESPIRATORY_TRACT | 0 refills | 0.00000 days | PRN
Start: 2023-11-30 — End: ?

## 2023-11-30 MED ORDER — ONDANSETRON 4 MG DISINTEGRATING TABLET
ORAL_TABLET | Freq: Three times a day (TID) | 0 refills | 7.00000 days | PRN
Start: 2023-11-30 — End: 2023-12-07

## 2023-11-30 MED ORDER — SYMBICORT 80 MCG-4.5 MCG/ACTUATION HFA AEROSOL INHALER
Freq: Every day | RESPIRATORY_TRACT | 0 refills | 61.00000 days
Start: 2023-11-30 — End: ?

## 2023-11-30 MED ORDER — MELATONIN 3 MG TABLET
ORAL_TABLET | Freq: Every evening | ORAL | 0 refills | 30.00000 days
Start: 2023-11-30 — End: ?

## 2023-11-30 MED ADMIN — potassium phosphate 30 mmol in sodium chloride (NS) 0.9 % 500 mL infusion: 30 mmol | INTRAVENOUS | @ 14:00:00 | Stop: 2023-11-30

## 2023-11-30 MED ADMIN — lidocaine (PF) (XYLOCAINE-MPF) 20 mg/mL (2 %) injection: INTRAVENOUS | @ 17:00:00 | Stop: 2023-11-30

## 2023-11-30 MED ADMIN — QUEtiapine (SEROQUEL) tablet 100 mg: 100 mg | ORAL | @ 01:00:00

## 2023-11-30 MED ADMIN — nicotine (NICODERM CQ) 14 mg/24 hr patch 1 patch: 1 | TRANSDERMAL | @ 14:00:00

## 2023-11-30 MED ADMIN — ondansetron (ZOFRAN) injection 4 mg: 4 mg | INTRAVENOUS | @ 19:00:00

## 2023-11-30 MED ADMIN — prochlorperazine (COMPAZINE) injection 5 mg: 5 mg | INTRAVENOUS | @ 14:00:00

## 2023-11-30 MED ADMIN — ondansetron (ZOFRAN) injection 4 mg: 4 mg | INTRAVENOUS | @ 03:00:00

## 2023-11-30 MED ADMIN — HYDROmorphone (PF) (DILAUDID) injection Syrg 0.5 mg: .5 mg | INTRAVENOUS | @ 03:00:00 | Stop: 2023-12-09

## 2023-11-30 MED ADMIN — cholecalciferol (vitamin D3 25 mcg (1,000 units)) tablet 25 mcg: 25 ug | ORAL | @ 14:00:00

## 2023-11-30 MED ADMIN — HYDROmorphone (PF) (DILAUDID) injection Syrg 0.5 mg: .5 mg | INTRAVENOUS | @ 14:00:00 | Stop: 2023-12-09

## 2023-11-30 MED ADMIN — azithromycin (ZITHROMAX) tablet 250 mg: 250 mg | ORAL | @ 14:00:00 | Stop: 2023-12-03

## 2023-11-30 MED ADMIN — HYDROmorphone (PF) (DILAUDID) injection Syrg 0.5 mg: .5 mg | INTRAVENOUS | @ 19:00:00 | Stop: 2023-12-09

## 2023-11-30 MED ADMIN — fluticasone furoate-vilanterol (BREO ELLIPTA) 100-25 mcg/dose inhaler 1 puff: 1 | RESPIRATORY_TRACT | @ 14:00:00

## 2023-11-30 MED ADMIN — pantoprazole (Protonix) EC tablet 40 mg: 40 mg | ORAL | @ 14:00:00

## 2023-11-30 MED ADMIN — acetaminophen (TYLENOL) tablet 1,000 mg: 1000 mg | ORAL | @ 09:00:00

## 2023-11-30 MED ADMIN — cariprazine (VRAYLAR) capsule 1.5 mg: 1.5 mg | ORAL | @ 14:00:00

## 2023-11-30 MED ADMIN — peg-electrolyte soln (GoLYTELY) solution 4,000 mL: 4000 mL | ORAL | @ 02:00:00 | Stop: 2023-11-29

## 2023-11-30 MED ADMIN — HYDROmorphone (PF) (DILAUDID) injection Syrg 0.5 mg: .5 mg | INTRAVENOUS | @ 09:00:00 | Stop: 2023-12-09

## 2023-11-30 MED ADMIN — predniSONE (DELTASONE) tablet 40 mg: 40 mg | ORAL | @ 14:00:00 | Stop: 2023-11-30

## 2023-11-30 MED ADMIN — melatonin tablet 3 mg: 3 mg | ORAL | @ 21:00:00

## 2023-11-30 MED ADMIN — prochlorperazine (COMPAZINE) injection 5 mg: 5 mg | INTRAVENOUS | @ 07:00:00

## 2023-11-30 MED ADMIN — propofol (DIPRIVAN) infusion 10 mg/mL: INTRAVENOUS | @ 17:00:00 | Stop: 2023-11-30

## 2023-11-30 MED ADMIN — acetaminophen (TYLENOL) tablet 1,000 mg: 1000 mg | ORAL | @ 21:00:00

## 2023-11-30 MED ADMIN — ondansetron (ZOFRAN) injection 4 mg: 4 mg | INTRAVENOUS | @ 09:00:00

## 2023-11-30 MED ADMIN — ondansetron (ZOFRAN) injection: INTRAVENOUS | @ 17:00:00 | Stop: 2023-11-30

## 2023-11-30 MED ADMIN — lactated Ringers infusion: 10 mL/h | INTRAVENOUS | @ 16:00:00

## 2023-11-30 MED ADMIN — Propofol (DIPRIVAN) injection: INTRAVENOUS | @ 17:00:00 | Stop: 2023-11-30

## 2023-11-30 NOTE — Unmapped (Signed)
 Patient alert and oriented x 4 today. Vitals stable.  Patient continues to complain of pain in his abdomen along with nausea which the PRN medications are effective.  He had his colonoscopy today and no interventions were done during scope.  He is back in his room currently receiving the remainder of his potassium phosphate  infusion.  He just ate some spaghetti for lunch.  No other concerns.  Will monitor.       Problem: Adult Inpatient Plan of Care  Goal: Plan of Care Review  Outcome: Progressing  Goal: Patient-Specific Goal (Individualized)  Outcome: Progressing  Flowsheets (Taken 11/30/2023 1629)  Patient/Family-Specific Goals (Include Timeframe): Patient will report adequate pain control during shift  Individualized Care Needs: monitor labs, vs, pain and nausea meds  Anxieties, Fears or Concerns: Pain conrol  Goal: Absence of Hospital-Acquired Illness or Injury  Outcome: Progressing  Intervention: Identify and Manage Fall Risk  Recent Flowsheet Documentation  Taken 11/30/2023 0800 by Tommy Minichiello K, RN  Safety Interventions:   fall reduction program maintained   low bed   nonskid shoes/slippers when out of bed  Intervention: Prevent Skin Injury  Recent Flowsheet Documentation  Taken 11/30/2023 0800 by Khaleesi Gruel K, RN  Skin Protection: adhesive use limited  Intervention: Prevent Infection  Recent Flowsheet Documentation  Taken 11/30/2023 0800 by Anessa Charley K, RN  Infection Prevention:   environmental surveillance performed   hand hygiene promoted   personal protective equipment utilized   rest/sleep promoted  Goal: Optimal Comfort and Wellbeing  Outcome: Progressing  Intervention: Monitor Pain and Promote Comfort  Recent Flowsheet Documentation  Taken 11/30/2023 1030 by Mialynn Shelvin K, RN  Pain Management Interventions:   quiet environment facilitated   care clustered  Intervention: Provide Person-Centered Care  Recent Flowsheet Documentation  Taken 11/30/2023 1030 by Boni Maclellan K, RN  Trust Relationship/Rapport: care explained   questions encouraged   reassurance provided  Goal: Readiness for Transition of Care  Outcome: Progressing  Goal: Rounds/Family Conference  Outcome: Progressing

## 2023-11-30 NOTE — Unmapped (Signed)
 [facility]   Progress Note         Assessment and Recommendations:   Jerome Kennedy. 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 The patient is seen in consultation at the request of Brady Cagey, MD ([facility] ([facility])) for Crohn's disease.    Patient underwent colonoscopy 11/30/23 which showed no colonic disease but significant ileal inflammation. Recommend starting IV steroids. Typical course is 48-72hrs followed by an oral pred taper. We will discuss with his outpatient IBD team plan for next steps.    Recommendations:  - START IV methylpred 20mg  TID  - Cont chemical deep vein thrombosis prophylaxis (patient is at high risk for clots in setting of IBD flare)  - Order daily CRP, CMP      Issues Impacting Complexity of Management:  -None    Recommendations discussed with the patient's primary team. We will continue to follow along with you.    Subjective:   - Colonoscopy today    Objective:   Temp:  [36.4 ??C-36.7 ??C] 36.5 ??C  Pulse:  [63-68] 64  Resp:  [16-18] 16  BP: (115-126)/(76-83) 115/83  SpO2:  [96 %-99 %] 97 %    Gen: WDWN male in NAD, answers questions appropriately  Abdomen: abdominal hernia. NT/ND  Extremities: No edema in the BLEs    Pertinent Labs & Studies:  -I have reviewed the patient's labs from 11/30/23 which show stable Hgb, stable renal function (SCr, electrolytes), and stable LFTs

## 2023-11-30 NOTE — Unmapped (Signed)
 [facility]  Emergency Department Provider Note      ED Clinical Impression      Final diagnoses:   Crohn's disease of small intestine without complication (Primary)            Impression, Medical Decision Making, Progress Notes and Critical Care      Impression, Differential Diagnosis, Medical Decision Making, and Plan of Care    24M w/ PMH Crohn's presenting with lower abdominal pain. Differential includes crohn's flare, obstruction, diverticulitis. Plan for CT imaging, labs, pain control.    Signed out pending labs and CT imaging.      Critical Care    NA    Portions of this record have been created using Dragon dictation software. Dictation errors have been sought, but may not have been identified and corrected.    See chart and resident provider documentation for details.    ____________________________________________         History        Reason for Visit  Abdominal Pain      HPI   Jerome Niess. is a 48 y.o. male pmh Crohn's disease and partial bowel resection p/w lower abdominal pain feeling similar to Crohn's flare. Denies dysuria. Reports N/V. Denies fevers.     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, left       Past Surgical History:   Procedure Laterality Date    CHOLECYSTECTOMY      COLON SURGERY      partial resection    HERNIA REPAIR      PR ARTHRP INTERCARPAL/CARP/MTCRPL JT INTERPOSITION Left 08/07/2022    Procedure: INTERPOSIT ARTHROPLASTY-INTERCARPAL/CARPOMETACAR;  Surgeon: Adonna Hoots, MD;  Location: MAIN OR [facility];  Service: Orthopedics    PR CLOSE ENTEROSTOMY,RESEC+COLOREC ANAS Midline 11/01/2019    Procedure: CLO ENTEROSTOMY; W/RESECT Lethaniel Rave;  Surgeon: Eusebio High, MD;  Location: MAIN OR [place];  Service: Gastrointestinal    PR COLONOSCOPY FLX DX W/COLLJ SPEC WHEN PFRMD Left 01/07/2013  Procedure: COLONOSCOPY, FLEXIBLE, PROXIMAL TO SPLENIC FLEXURE; DIAGNOSTIC, W/WO COLLECTION SPECIMEN BY BRUSH OR WASH;  Surgeon: Lizbeth Right, MD;  [facility]: [facility] PROC[facility]URES [facility];  Service: Gastroenterology    PR COLONOSCOPY FLX DX W/COLLJ SPEC WHEN PFRMD  03/09/2015    Procedure: COLONOSCOPY, FLEXIBLE, PROXIMAL TO SPLENIC FLEXURE; DIAGNOSTIC, W/WO COLLECTION SPECIMEN BY BRUSH OR WASH;  Surgeon: Delene Feinstein, MD;  Location: GI PROCEDURES [facility];  Service: Gastroenterology    PR COLONOSCOPY FLX DX W/COLLJ SPEC WHEN PFRMD N/A 10/28/2019    Procedure: COLONOSCOPY, FLEXIBLE, PROXIMAL TO SPLENIC FLEXURE; DIAGNOSTIC, W/WO COLLECTION SPECIMEN BY BRUSH OR WASH;  Surgeon: Sarajane Cumming, MD;  Location: GI PROCEDURES [facility];  Service: Gastroenterology    PR COLONOSCOPY FLX DX W/COLLJ SPEC WHEN PFRMD N/A 10/01/2022    Procedure: COLONOSCOPY, FLEXIBLE, PROXIMAL TO SPLENIC FLEXURE; DIAGNOSTIC, W/WO COLLECTION SPECIMEN BY BRUSH OR WASH; Surgeon: Rana Burr, MD;  Location: GI PROCEDURES [facility];  Service: Gastroenterology    PR EXPLORATORY OF ABDOMEN Midline 09/02/2019    Procedure: EXPLORATORY LAPAROTOMY, EXPLORATORY CELIOTOMY WITH OR WITHOUT BIOPSY(S);  Surgeon: Eusebio High, MD;  Location: MAIN OR [facility];  Service: Gastrointestinal    PR EXPLORATORY OF ABDOMEN Midline 11/01/2019    Procedure: EXPLORATORY LAPAROTOMY, EXPLORATORY CELIOTOMY WITH OR WITHOUT BIOPSY(S);  Surgeon: Eusebio High, MD;  Location: MAIN OR [facility];  Service: Gastrointestinal    PR FIX FINGER,VOLAR PLATE,I-P JT Left 08/07/2022    Procedure: REPAIR AND RECONSTRUCTION, FINGER, VOLAR PLATE, INTERPHALANGEAL JOINT;  Surgeon: Adonna Hoots, MD;  Location: MAIN OR [facility];  Service: Orthopedics    PR FREEING BOWEL ADHESION,ENTEROLYSIS N/A 11/01/2019    Procedure: Enterolysis (Separt Proc);  Surgeon: Eusebio High, MD;  Location: MAIN OR [facility];  Service: Gastrointestinal    PR FUSION MC-P JT Left 08/27/2023    Procedure: ARTHRODESIS, METACARPOPHALANGEAL JOINT, WITH OR WITHOUT INTERNAL FIXATION;  Surgeon: Adonna Hoots, MD;  Location: OR [facility];  Service: Orthopedics    PR ILEOSCOPY THRU STOMA,BIOPSY N/A 10/14/2019    Procedure: Erby Hatcher; Lamarr Pilling 1/MX;  Surgeon: Loel Ring, MD;  Location: GI PROCEDURES [facility];  Service: Gastroenterology    PR PART REMOVAL COLON W COLOSTOMY Midline 09/02/2019    Procedure: COLECTOMY, PARTIAL; WITH SKIN LEVEL CECOSTOMY OR COLOSTOMY;  Surgeon: Eusebio High, MD;  Location: MAIN OR [facility];  Service: Gastrointestinal    PR REPAIR INCISIONAL HERNIA,REDUCIBLE Midline 09/02/2019    Procedure: REPAIR INIT INCISIONAL OR VENTRAL HERNIA; REDUCIBLE;  Surgeon: Caro Christmas, MD;  Location: MAIN OR [place];  Service: Trauma    PR UPPER GI ENDOSCOPY,BIOPSY N/A 01/07/2013    Procedure: UGI ENDOSCOPY; WITH BIOPSY, SINGLE OR MULTIPLE;  Surgeon: Lizbeth Right, MD;  Location: GI PROCEDURES [facility];  Service: Gastroenterology    PR UPPER GI ENDOSCOPY,BIOPSY N/A 10/14/2019    Procedure: UGI ENDOSCOPY; WITH BIOPSY, SINGLE OR MULTIPLE;  Surgeon: Loel Ring, MD;  Location: GI PROCEDURES [facility];  Service: Gastroenterology    PR UPPER GI ENDOSCOPY,DIAGNOSIS N/A 10/01/2022    Procedure: UGI ENDO, INCLUDE ESOPHAGUS, STOMACH, & DUODENUM &/OR JEJUNUM; DX W/WO COLLECTION SPECIMN, BY BRUSH OR WASH;  Surgeon: Rana Burr, MD;  Location: GI PROCEDURES [facility];  Service: Gastroenterology       No current facility-administered medications for this encounter.  No current outpatient medications on file.    Facility-Administered Medications Ordered in Other Encounters:     acetaminophen  (TYLENOL ) tablet 1,000 mg, 1,000 mg, Oral, TID, Drouin-Allaire, Camille A, DO, 1,000 mg  at 11/30/23 0527    aluminum -magnesium  hydroxide-simethicone  (MAALOX MAX) 80-80-8 mg/mL oral suspension, 30 mL, Oral, Q4H PRN, Drouin-Allaire, Camille A, DO    [COMPLETED] azithromycin  (ZITHROMAX ) tablet 500 mg, 500 mg, Oral, Q24H SCH, 500 mg at 11/28/23 1201 **FOLLOWED BY** azithromycin  (ZITHROMAX ) tablet 250 mg, 250 mg, Oral, Q24H SCH, Drouin-Allaire, Camille A, DO, 250 mg at 11/30/23 0941    calcium  carbonate (TUMS) chewable tablet 400 mg elem calcium , 400 mg elem calcium , Oral, Daily PRN, Drouin-Allaire, Camille A, DO    cariprazine  (VRAYLAR ) capsule 1.5 mg, 1.5 mg, Oral, Daily, Drouin-Allaire, Camille A, DO, 1.5 mg at 11/30/23 2536    cholecalciferol  (vitamin D3 25 mcg (1,000 units)) tablet 25 mcg, 25 mcg, Oral, Daily, Drouin-Allaire, Camille A, DO, 25 mcg at 11/30/23 0941    [Provider Hold] enoxaparin  (LOVENOX ) syringe 40 mg, 40 mg, Subcutaneous, Nightly, Drouin-Allaire, Camille A, DO, 40 mg at 11/28/23 2122    fluticasone  furoate-vilanterol (BREO ELLIPTA ) 100-25 mcg/dose inhaler 1 puff, 1 puff, Inhalation, Daily (RT), Drouin-Allaire, Camille A, DO, 1 puff at 11/30/23 0941    guaiFENesin (ROBITUSSIN) oral syrup, 200 mg, Oral, Q4H PRN, Drouin-Allaire, Camille A, DO    HYDROmorphone  (PF) (DILAUDID ) injection Syrg 0.5 mg, 0.5 mg, Intravenous, Q4H PRN, Drouin-Allaire, Camille A, DO, 0.5 mg at 11/30/23 1529    hydrOXYzine  (ATARAX ) tablet 25 mg, 25 mg, Oral, Q6H PRN, Drouin-Allaire, Camille A, DO, 25 mg at 11/29/23 1947    ipratropium-albuterol  (DUO-NEB) 0.5-2.5 mg/3 mL nebulizer solution 3 mL, 3 mL, Nebulization, Q6H PRN, Drouin-Allaire, Camille A, DO    lactated Ringers  infusion, 10 mL/hr, Intravenous, Continuous, Drouin-Allaire, Camille A, DO, Last Rate: 10 mL/hr at 11/30/23 1318, Continued by Anesthesia at 11/30/23 1318    melatonin tablet 3 mg, 3 mg, Oral, QPM, Drouin-Allaire, Camille A, DO, 3 mg at 11/29/23 1848    melatonin tablet 3 mg, 3 mg, Oral, Nightly PRN, Drouin-Allaire, Camille A, DO    nicotine  (NICODERM CQ ) 14 mg/24 hr patch 1 patch, 1 patch, Transdermal, Daily, Drouin-Allaire, Camille A, DO, 1 patch at 11/30/23 0941    ondansetron  (ZOFRAN ) injection 4 mg, 4 mg, Intravenous, Q6H PRN, Drouin-Allaire, Camille A, DO, 4 mg at 11/30/23 1529    pantoprazole  (Protonix ) EC tablet 40 mg, 40 mg, Oral, Daily, Drouin-Allaire, Camille A, DO, 40 mg at 11/30/23 0941    prochlorperazine  (COMPAZINE ) injection 5 mg, 5 mg, Intravenous, Q6H PRN, Drouin-Allaire, Camille A, DO, 5 mg at 11/30/23 6440    QUEtiapine  (SEROQUEL ) tablet 100 mg, 100 mg, Oral, Nightly, Drouin-Allaire, Camille A, DO, 100 mg at 11/29/23 2110    senna (SENOKOT) tablet 2 tablet, 2 tablet, Oral, Nightly PRN, Drouin-Allaire, Camille A, DO    Allergies  Morphine  and Toradol [ketorolac]    Family History   Problem Relation Age of Onset    Breast cancer Mother     Diabetes Maternal Aunt     Colon cancer Maternal Grandmother     Prostate cancer Maternal Grandfather     Cancer Paternal Grandmother     Crohn's disease Neg Hx        Social History  Social History     Tobacco Use    Smoking status: Every Day     Current packs/day: 0.30     Average packs/day: 0.5 packs/day for 22.2 years (10.5 ttl pk-yrs)     Types: Cigarettes     Start date: 10/02/2001     Passive exposure: Current    Smokeless tobacco: Never    Tobacco comments:  Reduced use from 10cpd to 5cpd, though relights each one. Motivated to quit.    Vaping Use    Vaping status: Some Days    Substances: Nicotine     Passive vaping exposure: Yes   Substance Use Topics    Alcohol use: No     Alcohol/week: 0.0 standard drinks of alcohol    Drug use: Yes     Types: Marijuana     Comment: Smokes 2-3 joints/day          Physical Exam     ED Triage Vitals   Enc Vitals Group      BP 11/15/23 1517 128/77      Pulse 11/15/23 1517 77      SpO2 Pulse --       Resp 11/15/23 1517 19      Temp 11/15/23 1517 36.7 ??C (98.1 ??F)      Temp Source 11/16/23 0030 Oral      SpO2 11/15/23 1517 100 %      Weight 11/15/23 1517 68 kg (150 lb)      Height 11/16/23 0745 1.727 m (5' 7.99)      Head Circumference --       Peak Flow --       Pain Score --       Pain Loc --       Pain Education --       Exclude from Growth Chart --        Constitutional: Uncomfortable appearing.  Eyes: Conjunctivae are normal.  ENT       Head: Normocephalic and atraumatic.       Nose: No congestion.       Mouth/Throat: Mucous membranes are moist.       Neck: No stridor.  Cardiovascular: Normal rate, regular rhythm.   Respiratory: Normal respiratory effort. Breath sounds are normal.  Gastrointestinal: Soft. TTP bilateral lower quadrants.  Musculoskeletal: Normal range of motion in all extremities.       Right lower leg: No tenderness or edema.       Left lower leg: No tenderness or edema.  Neurologic: Normal speech and language. No gross focal neurologic deficits are appreciated.  Skin: Skin is warm, dry and intact. No rash noted.  Psychiatric: Mood and affect are normal. Speech and behavior are normal.       Radiology     CT Abdomen Pelvis W IV Contrast   Final Result   --Questionable focal wall thickening of the distal second and proximal third portions of the duodenum may reflect an infectious or inflammatory enteritis. Otherwise, no acute findings in the abdomen or pelvis.              Procedures     NA               Orin Birk, MD  11/30/23 [phone]

## 2023-11-30 NOTE — Unmapped (Signed)
 VENOUS ACCESS ULTRASOUND PROCEDURE NOTE    Indications:   Poor venous access.    The Venous Access Team has assessed this patient for the placement of a PIV. Ultrasound guidance was necessary to obtain access.     Procedure Details:  Identity of the patient was confirmed via name, medical record number and date of birth. The availability of the correct equipment was verified.    The vein was identified for ultrasound catheter insertion.  Field was prepared with necessary supplies and equipment.  Probe cover and sterile gel utilized.  Insertion site was prepped with chlorhexidine solution and allowed to dry.  The catheter extension was primed with normal saline. A(n) 22 gauge 1.75 catheter was placed in the L Forearm with 1attempt(s). See LDA for additional details.    Catheter aspirated, 2 mL blood return present. The catheter was then flushed with 10 mL of normal saline. Insertion site cleansed, and dressing applied per manufacturer guidelines. The catheter was inserted with difficulty due to poor vasculature by Fredderick Erb, RN.    Primary RN was notified.     Thank you,     Fredderick Erb, RN Venous Access Team   [phone]     Workup / Procedure Time:  30 minutes    See vein image in PACs.

## 2023-11-30 NOTE — Unmapped (Addendum)
 Pt admitted with exacerbation of crohn's disease day 5. Pt alert oriented x4, vss, pt continues with Golytely  prep bottle #3. Received zofran  for nausea and dilaudid  for pain. Pt stated he better be clear today or he will go home  PT still working on prep almost clear.  Pt voiced understanding of POC.Stools clear this am  Problem: Adult Inpatient Plan of Care  Goal: Plan of Care Review  11/30/2023 0120 by Rob Chihuahua, RN  Outcome: Progressing  11/30/2023 0119 by Rob Chihuahua, RN  Outcome: Progressing  Goal: Patient-Specific Goal (Individualized)  11/30/2023 0120 by Rob Chihuahua, RN  Outcome: Progressing  Flowsheets (Taken 11/30/2023 0120)  Patient/Family-Specific Goals (Include Timeframe): PT will have a successful bowel prep this shift for colonospy in am.  Individualized Care Needs: monitor labs, vs, pain and nausea meds,  npo for scope in am.  Anxieties, Fears or Concerns: pain  11/30/2023 0119 by Rob Chihuahua, RN  Outcome: Progressing  Goal: Absence of Hospital-Acquired Illness or Injury  11/30/2023 0120 by Rob Chihuahua, RN  Outcome: Progressing  11/30/2023 0119 by Rob Chihuahua, RN  Outcome: Progressing  Intervention: Identify and Manage Fall Risk  Recent Flowsheet Documentation  Taken 11/29/2023 2030 by Rob Chihuahua, RN  Safety Interventions:   fall reduction program maintained   environmental modification   commode/urinal/bedpan at bedside   lighting adjusted for tasks/safety   nonskid shoes/slippers when out of bed  Intervention: Prevent and Manage VTE (Venous Thromboembolism) Risk  Recent Flowsheet Documentation  Taken 11/30/2023 0000 by Rob Chihuahua, RN  Anti-Embolism Device Status: (lovenox  held for scope) Other (Comment)  Taken 11/29/2023 2215 by Rob Chihuahua, RN  Anti-Embolism Device Status: (lovenox  held for scope) Other (Comment)  Taken 11/29/2023 2030 by Rob Chihuahua, RN  Anti-Embolism Device Status: (lovenox  held for scope) Other (Comment)  Goal: Optimal Comfort and Wellbeing  11/30/2023 0120 by Rob Chihuahua, RN  Outcome: Progressing  11/30/2023 0119 by Rob Chihuahua, RN  Outcome: Progressing  Goal: Readiness for Transition of Care  11/30/2023 0120 by Rob Chihuahua, RN  Outcome: Progressing  11/30/2023 0119 by Rob Chihuahua, RN  Outcome: Progressing  Goal: Rounds/Family Conference  11/30/2023 0120 by Rob Chihuahua, RN  Outcome: Progressing  11/30/2023 0119 by Rob Chihuahua, RN  Outcome: Progressing     Problem: Bowel Disease, Inflammatory (Ulcerative Colitis or Crohn's Disease)  Goal: Optimal Adaptation to Chronic Illness  11/30/2023 0120 by Rob Chihuahua, RN  Outcome: Progressing  11/30/2023 0119 by Rob Chihuahua, RN  Outcome: Progressing  Goal: Diarrhea Symptom Relief  11/30/2023 0120 by Rob Chihuahua, RN  Outcome: Progressing  11/30/2023 0119 by Rob Chihuahua, RN  Outcome: Progressing  Goal: Absence of Infection Signs and Symptoms  11/30/2023 0120 by Rob Chihuahua, RN  Outcome: Progressing  11/30/2023 0119 by Rob Chihuahua, RN  Outcome: Progressing  Goal: Optimal Nutrition Delivery  11/30/2023 0120 by Rob Chihuahua, RN  Outcome: Progressing  11/30/2023 0119 by Rob Chihuahua, RN  Outcome: Progressing  Goal: Optimal Pain Control and Function  11/30/2023 0120 by Rob Chihuahua, RN  Outcome: Progressing  11/30/2023 0119 by Rob Chihuahua, RN  Outcome: Progressing

## 2023-11-30 NOTE — Unmapped (Signed)
 Internal Medicine (MEDW) Progress Note    Assessment & Plan:   Jerome Kennedy. is a 48 y.o. male whose presentation is complicated by Crohn's disease s/p hemicolectomy, COPD, bipolar disorder that presented to [facility] with abdominal pain, nausea, vomiting, and diarrhea with concern for Crohn's flare.     Principal Problem:    Exacerbation of Crohn's disease  Active Problems:    Crohn's disease    Abdominal pain    Loose stools    Bipolar 1 disorder    COPD (chronic obstructive pulmonary disease)    Active Problems    Abdominal Pain, N/V/D - c/f Crohn's Flare - Hx of CD s/p Hemicolectomy  Presents with abdominal pain, nausea and vomiting, and diarrhea, reports his symptoms have not changed since most recent discharge from [facility] on 4/20 for the same. Currently on Humira  weekly for CD. CT on 4/19 with new multifocal bowel wall thickening and enhancement in the RLQ and sigmoid suspicious for colitis. Stool calprotectin elevated at 717. C diff and GIPP negative. ESR/CRP here negative. Given negative infectious workup, concern for Crohn's flare with other lab and imaging findings. GI planning for colonoscopy once bowel prep is complete. Patient has difficulty with the bowel prep, most recent colonoscopies in 2020 and 2021 were unable to reach ileocolonic anastomosis due to poor prep and colonic anatomy. CRP uptrending to 19.9. Bowel prep progressing, CLN today.  - GI planning for colonoscopy today  - NPO until procedure  - Sch Tylenol , prn oxycodone  5-10 panel, dilaudid  for pain  - Compazine , zofran  prn for nausea  - DVT ppx given high risk for clots with IBD  - Daily CMP, CRP    COPD Exacerbation  Patient reports diagnosis of COPD, though per chart review, has never had PFTs or seen pulmonology. Unclear whether true COPD history or asthma. Only on Symbicort  in outpatient setting. Reports increased cough and sputum production x 1 week. Denies history of COPD exacerbations. CXR clear. Will treat for COPD exacerbation though would benefit from outpatient PFTs to establish true diagnosis.  - Continue Breo Ellipta , duo nebs prn  - Continue prednisone  40 mg x 5 days (4/26 - 4/30)  - Continue Azithromycin  x 5 days (4/26 - 4/30)    Microcytic Anemia - Hx of Iron  Deficiency Anemia  Hgb low on admission 12.1. Iron  studies last checked 04/2023. Iron  studies repeated here, ferritin 15.2, iron  78. 1000 mg iron  deficit by the Ganzoni equation.  - S/p IV Dextran on 4/24  - Daily CBC    Chronic Problems    Bipolar disorder: Continue Vraylar  and Seroquel    GERD: Continue home PPI    Daily Checklist:  Diet:  NPO until procedure then regular diet  DVT PPx: Lovenox  40mg  q24h  Electrolytes: No Repletion Needed  Code Status: Full Code  Dispo:  Pending colonoscopy     Team Contact Information:   Primary Team: Internal Medicine (MEDW)  Primary Resident: Gerianne Kobs, DO; Evone Hoh, MS4  Resident's Pager: [phone] (Gen MedW Intern - Tower)    Interval History:     No acute events overnight. Has been taking prep, states that his BMs are clear. Nursing note overnight said they were not quite clear. Wants to get colonoscopy done today, will leave otherwise per RN note. Denies shortness of breath but does notice mild wheeze and improving cough. Denies abdominal pain, nausea, or vomiting this AM.    Objective:   Temp:  [36.4 ??C (97.5 ??F)-36.7 ??C (98.1 ??F)] 36.7 ??C (98.1 ??F)  Pulse:  [63-68] 63  Resp:  [16-18] 16  BP: (117-126)/(76-81) 117/77  SpO2:  [96 %-99 %] 96 %    Gen: NAD, laying in bed, converses appropriately  HENT: atraumatic, normocephalic  Lungs: breathing comfortably on room air, intermittent expiratory wheeze in middle and upper lobes bilaterally  Abdominal: soft, nontender to palpation  Extremities: no edema    Labs:  Lab Results   Component Value Date    WBC 5.3 11/29/2023    HGB 12.4 (L) 11/29/2023    HCT 37.5 (L) 11/29/2023    PLT 204 11/29/2023     Lab Results   Component Value Date    NA 144 11/30/2023    K 3.6 11/30/2023    CL 109 (H) 11/30/2023    CO2 26.0 11/30/2023    BUN <5 (L) 11/30/2023    CREATININE 0.91 11/30/2023    GLU 71 11/30/2023    CALCIUM  8.0 (L) 11/30/2023    MG 1.8 11/30/2023    PHOS 1.8 (L) 11/30/2023     Lab Results   Component Value Date    BILITOT 0.4 11/30/2023    BILIDIR 0.20 04/26/2023    PROT 6.2 11/30/2023    ALBUMIN 3.2 (L) 11/30/2023    ALT 8 (L) 11/30/2023    AST 14 11/30/2023    ALKPHOS 145 (H) 11/30/2023    GGT 57 01/05/2013     Lab Results   Component Value Date    PT 11.3 10/18/2023    INR 0.99 10/18/2023    APTT 22.4 (L) 10/18/2023      Latest Reference Range & Units 11/21/23 19:06   Calprotectin, Stool <50.0 (Normal) mcg/g 717 (H)   (H): Data is abnormally high     Latest Reference Range & Units 11/26/23 05:19 11/27/23 04:05 11/28/23 04:05 11/29/23 04:16 11/30/23 05:24   CRP <=10.0 mg/L <5.0 <5.0 13.5 (H) 19.9 (H) 12.8 (H)   (H): Data is abnormally high        Evone Hoh, [facility]  [facility]    I attest that I have reviewed the medical student note and that the components of the history of the present illness, the physical exam, and the assessment and plan documented were performed by me or were performed in my presence by the student where I verified the documentation and performed (or re-performed) the exam and medical decision making. Raynetta Osterloh A Drouin-Allaire, DO

## 2023-12-01 DIAGNOSIS — K50018 Crohn's disease of small intestine with other complication: Principal | ICD-10-CM

## 2023-12-01 LAB — COMPREHENSIVE METABOLIC PANEL
ALBUMIN: 3.1 g/dL — ABNORMAL LOW (ref 3.4–5.0)
ALKALINE PHOSPHATASE: 139 U/L — ABNORMAL HIGH (ref 46–116)
ALT (SGPT): 8 U/L — ABNORMAL LOW (ref 10–49)
ANION GAP: 10 mmol/L (ref 5–14)
AST (SGOT): 12 U/L (ref <=34)
BILIRUBIN TOTAL: 0.3 mg/dL (ref 0.3–1.2)
BLOOD UREA NITROGEN: 5 mg/dL — ABNORMAL LOW (ref 9–23)
BUN / CREAT RATIO: 5
CALCIUM: 8.3 mg/dL — ABNORMAL LOW (ref 8.7–10.4)
CHLORIDE: 107 mmol/L (ref 98–107)
CO2: 23 mmol/L (ref 20.0–31.0)
CREATININE: 0.99 mg/dL (ref 0.73–1.18)
EGFR CKD-EPI (2021) MALE: >90 mL/min/1.73m2 (ref >=60)
GLUCOSE RANDOM: 137 mg/dL (ref 70–179)
POTASSIUM: 4.6 mmol/L (ref 3.4–4.8)
PROTEIN TOTAL: 6.1 g/dL (ref 5.7–8.2)
SODIUM: 140 mmol/L (ref 135–145)

## 2023-12-01 LAB — CBC
HEMATOCRIT: 40.2 % (ref 39.0–48.0)
HEMOGLOBIN: 12.9 g/dL (ref 12.9–16.5)
MEAN CORPUSCULAR HEMOGLOBIN CONC: 32 g/dL (ref 32.0–36.0)
MEAN CORPUSCULAR HEMOGLOBIN: 27.2 pg (ref 25.9–32.4)
MEAN CORPUSCULAR VOLUME: 85 fL (ref 77.6–95.7)
MEAN PLATELET VOLUME: 9.6 fL (ref 6.8–10.7)
PLATELET COUNT: 118 10*9/L — ABNORMAL LOW (ref 150–450)
RED BLOOD CELL COUNT: 4.73 10*12/L (ref 4.26–5.60)
RED CELL DISTRIBUTION WIDTH: 15.4 % — ABNORMAL HIGH (ref 12.2–15.2)
WBC ADJUSTED: 7.8 10*9/L (ref 3.6–11.2)

## 2023-12-01 LAB — C-REACTIVE PROTEIN: C-REACTIVE PROTEIN: 8 mg/L (ref <=10.0)

## 2023-12-01 LAB — PHOSPHORUS: PHOSPHORUS: 2.8 mg/dL (ref 2.4–5.1)

## 2023-12-01 LAB — MAGNESIUM: MAGNESIUM: 1.7 mg/dL (ref 1.6–2.6)

## 2023-12-01 MED ORDER — PREDNISONE 20 MG TABLET
ORAL_TABLET | Freq: Every day | ORAL | 0 refills | 2.00000 days
Start: 2023-12-01 — End: 2023-12-03

## 2023-12-01 MED ORDER — RINVOQ 45 MG TABLET,EXTENDED RELEASE
ORAL_TABLET | Freq: Every day | ORAL | 0 refills | 90.00000 days | Status: CP
Start: 2023-12-01 — End: 2024-02-29

## 2023-12-01 MED ORDER — NICOTINE 14 MG/24 HR DAILY TRANSDERMAL PATCH
MEDICATED_PATCH | Freq: Every day | TRANSDERMAL | 0 refills | 28.00000 days
Start: 2023-12-01 — End: ?

## 2023-12-01 MED ORDER — AZITHROMYCIN 250 MG TABLET
ORAL_TABLET | ORAL | 0 refills | 2.00000 days
Start: 2023-12-01 — End: ?

## 2023-12-01 MED ADMIN — methylPREDNISolone sodium succinate (PF) (SOLU-Medrol) injection 20 mg: 20 mg | INTRAVENOUS | @ 02:00:00

## 2023-12-01 MED ADMIN — acetaminophen (TYLENOL) tablet 1,000 mg: 1000 mg | ORAL | @ 22:00:00

## 2023-12-01 MED ADMIN — nicotine (NICODERM CQ) 14 mg/24 hr patch 1 patch: 1 | TRANSDERMAL | @ 12:00:00

## 2023-12-01 MED ADMIN — melatonin tablet 3 mg: 3 mg | ORAL | @ 22:00:00

## 2023-12-01 MED ADMIN — methylPREDNISolone sodium succinate (PF) (SOLU-Medrol) injection 20 mg: 20 mg | INTRAVENOUS | @ 21:00:00

## 2023-12-01 MED ADMIN — cariprazine (VRAYLAR) capsule 1.5 mg: 1.5 mg | ORAL | @ 12:00:00

## 2023-12-01 MED ADMIN — acetaminophen (TYLENOL) tablet 1,000 mg: 1000 mg | ORAL | @ 15:00:00

## 2023-12-01 MED ADMIN — ondansetron (ZOFRAN) injection 4 mg: 4 mg | INTRAVENOUS | @ 12:00:00 | Stop: 2023-12-01

## 2023-12-01 MED ADMIN — azithromycin (ZITHROMAX) tablet 250 mg: 250 mg | ORAL | @ 12:00:00 | Stop: 2023-12-03

## 2023-12-01 MED ADMIN — QUEtiapine (SEROQUEL) tablet 100 mg: 100 mg | ORAL | @ 01:00:00

## 2023-12-01 MED ADMIN — prochlorperazine (COMPAZINE) tablet 5 mg: 5 mg | ORAL | @ 17:00:00

## 2023-12-01 MED ADMIN — cholecalciferol (vitamin D3 25 mcg (1,000 units)) tablet 25 mcg: 25 ug | ORAL | @ 12:00:00

## 2023-12-01 MED ADMIN — acetaminophen (TYLENOL) tablet 1,000 mg: 1000 mg | ORAL | @ 10:00:00

## 2023-12-01 MED ADMIN — enoxaparin (LOVENOX) syringe 40 mg: 40 mg | SUBCUTANEOUS | @ 01:00:00

## 2023-12-01 MED ADMIN — oxyCODONE (ROXICODONE) immediate release tablet 5 mg: 5 mg | ORAL | @ 22:00:00 | Stop: 2023-12-15

## 2023-12-01 MED ADMIN — ondansetron (ZOFRAN-ODT) disintegrating tablet 4 mg: 4 mg | ORAL | @ 22:00:00

## 2023-12-01 MED ADMIN — ondansetron (ZOFRAN) injection 4 mg: 4 mg | INTRAVENOUS | @ 07:00:00 | Stop: 2023-12-01

## 2023-12-01 MED ADMIN — HYDROmorphone (PF) (DILAUDID) injection Syrg 0.5 mg: .5 mg | INTRAVENOUS | @ 02:00:00 | Stop: 2023-12-09

## 2023-12-01 MED ADMIN — fluticasone furoate-vilanterol (BREO ELLIPTA) 100-25 mcg/dose inhaler 1 puff: 1 | RESPIRATORY_TRACT | @ 12:00:00

## 2023-12-01 MED ADMIN — HYDROmorphone (PF) (DILAUDID) injection Syrg 0.5 mg: .5 mg | INTRAVENOUS | @ 07:00:00 | Stop: 2023-12-01

## 2023-12-01 MED ADMIN — oxyCODONE (ROXICODONE) immediate release tablet 5 mg: 5 mg | ORAL | @ 17:00:00 | Stop: 2023-12-15

## 2023-12-01 MED ADMIN — pantoprazole (Protonix) EC tablet 40 mg: 40 mg | ORAL | @ 12:00:00

## 2023-12-01 MED ADMIN — hydrOXYzine (ATARAX) tablet 25 mg: 25 mg | ORAL | @ 21:00:00

## 2023-12-01 MED ADMIN — HYDROmorphone (PF) (DILAUDID) injection Syrg 0.5 mg: .5 mg | INTRAVENOUS | @ 12:00:00 | Stop: 2023-12-01

## 2023-12-01 MED ADMIN — methylPREDNISolone sodium succinate (PF) (SOLU-Medrol) injection 20 mg: 20 mg | INTRAVENOUS | @ 15:00:00

## 2023-12-01 NOTE — Unmapped (Signed)
 Notified by Dr. Raenell Bump that pt has been admitted with colonoscopy showing active disease in ileum with SES-CD 7. Colon otherwise normal. He is currently on IV steroids with plan for PO taper. 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 Recommend switching therapy to Rinvoq  45 mg x 12 weeks for loading. Will ask primary team to check prebiologic labs again hep B and TB quant and lipid panel before discharge. Attempted to reach pt but his phone is off so luminal consult team will discuss. Message sent to schedulers for follow up with me June 12 at 3:30 pm (overbook ok).     Discussed with attending Dr. Arnell Lange.    Daria Eddy MD   Gastroenterology Fellow

## 2023-12-01 NOTE — Unmapped (Signed)
 Internal Medicine (MEDW) Progress Note    Assessment & Plan:   Jerome Kennedy. is a 48 y.o. male whose presentation is complicated by Crohn's disease s/p hemicolectomy, COPD, bipolar disorder that presented to [facility] with abdominal pain, nausea, vomiting, and diarrhea with concern for Crohn's flare.     Principal Problem:    Exacerbation of Crohn's disease  Active Problems:    Crohn's disease    Abdominal pain    Loose stools    Bipolar 1 disorder    COPD (chronic obstructive pulmonary disease)    Active Problems    Crohn's Flare - Hx of CD s/p Hemicolectomy  Presents with abdominal pain, nausea and vomiting, and diarrhea, reports his symptoms have not changed since most recent discharge from [facility] on 4/20 for the same. Currently on Humira  weekly for CD. CT on 4/19 with new multifocal bowel wall thickening and enhancement in the RLQ and sigmoid suspicious for colitis. Stool calprotectin elevated at 717. C diff and GIPP negative. ESR negative, CRP peaked at 19.9, now down to 8. Colonoscopy on 4/29 showed ileal inflammation (Rutgeert's i3) without colonic involvement. Will continue IV steroids per GI.  - GI following  - Continue IV methylprednisolone  20 mg TID for 48-72 hours followed by oral prednisone  taper  - Sch Tylenol , prn oxycodone  5mg , stop IV dilaudid   - PO compazine , zofran  prn for nausea  - DVT ppx given high risk for clots with IBD  - Regular diet  - Daily CMP, CRP  - Will likely change outpatient therapy to a new class, consider IL-23 ustekinumab or risankizumab    COPD Exacerbation  Patient reports diagnosis of COPD, though per chart review, has never had PFTs or seen pulmonology. Unclear whether true COPD history or asthma. Only on Symbicort  in outpatient setting. Reports increased cough and sputum production x 1 week. Denies history of COPD exacerbations. CXR clear. Will treat for COPD exacerbation though would benefit from outpatient PFTs to establish true diagnosis.  - Continue Breo Ellipta , duo nebs prn  - Continue Azithromycin  x 5 days (4/26 - 4/30)  - Stop oral prednisone  because now on IV methylpred    Microcytic Anemia - Hx of Iron  Deficiency Anemia  Hgb low on admission 12.1. Iron  studies last checked 04/2023. Iron  studies repeated here, ferritin 15.2, iron  78. 1000 mg iron  deficit by the Ganzoni equation.  - S/p IV Dextran on 4/24  - Daily CBC    Chronic Problems    Bipolar disorder: Continue Vraylar  and Seroquel    GERD: Continue home PPI    Daily Checklist:  Diet: Regular Diet  DVT PPx: Lovenox  40mg  q24h  Electrolytes: No Repletion Needed  Code Status: Full Code  Dispo: Goal Discharge: 12/02/23    Team Contact Information:   Primary Team: Internal Medicine (MEDW)  Primary Resident: Marcial Setting, MD; Evone Hoh, MS4  Resident's Pager: [phone] (Gen MedW Intern - Tower)    Interval History:     No acute events overnight. Feeling better this morning, continuing to have 5/10 RLQ abdominal pain but no nausea. Prefers oral oxycodone  to the IV dilaudid . Interested in leaving the hospital today, encouraged him to wait to speak with the GI doctors about the IV steroids and if he can receive them PO. Is have some difficulty falling asleep with the steroids but otherwise not having any side effects.     Objective:   Temp:  [36.6 ??C (97.9 ??F)-36.7 ??C (98.1 ??F)] 36.6 ??C (97.9 ??F)  Pulse:  [64-75] 75  Resp:  [12-18] 18  BP: (114-135)/(71-89) 128/75  SpO2:  [93 %-100 %] 100 %    Gen: NAD, laying in bed, converses appropriately  HENT: atraumatic, normocephalic  Lungs: breathing comfortably on room air, CTAB  Abdominal: soft, nontender to palpation, hernia present  Extremities: no edema    Labs:  Lab Results   Component Value Date    WBC 7.8 12/01/2023    HGB 12.9 12/01/2023    HCT 40.2 12/01/2023    PLT 118 (L) 12/01/2023     Lab Results   Component Value Date    NA 140 12/01/2023    K 4.6 12/01/2023    CL 107 12/01/2023    CO2 23.0 12/01/2023    BUN 5 (L) 12/01/2023    CREATININE 0.99 12/01/2023    GLU 137 12/01/2023 CALCIUM  8.3 (L) 12/01/2023    MG 1.7 12/01/2023    PHOS 2.8 12/01/2023     Lab Results   Component Value Date    BILITOT 0.3 12/01/2023    BILIDIR 0.20 04/26/2023    PROT 6.1 12/01/2023    ALBUMIN 3.1 (L) 12/01/2023    ALT 8 (L) 12/01/2023    AST 12 12/01/2023    ALKPHOS 139 (H) 12/01/2023    GGT 57 01/05/2013     Lab Results   Component Value Date    PT 11.3 10/18/2023    INR 0.99 10/18/2023    APTT 22.4 (L) 10/18/2023      Latest Reference Range & Units 11/21/23 19:06   Calprotectin, Stool <50.0 (Normal) mcg/g 717 (H)   (H): Data is abnormally high     Latest Reference Range & Units 11/26/23 05:19 11/27/23 04:05 11/28/23 04:05 11/29/23 04:16 11/30/23 05:24 12/01/23 03:52   CRP <=10.0 mg/L <5.0 <5.0 13.5 (H) 19.9 (H) 12.8 (H) 8.0   (H): Data is abnormally high        Evone Hoh, [facility]  [facility]    I attest that I have reviewed the medical student note and that the components of the history of the present illness, the physical exam, and the assessment and plan documented were performed by me or were performed in my presence by the student where I verified the documentation and performed (or re-performed) the exam and medical decision making. Marcial Setting, MD

## 2023-12-01 NOTE — Unmapped (Addendum)
 Patient alert and oriented x4 this shift. VSS on RA. PRN dialudid, oxy, atarax , zofran , and compazine  given. VAT came and placed new PIV. IV steriods given as ordered. No other complaints during this shift. Bed in lowest locked position with call bell within reach.     Problem: Adult Inpatient Plan of Care  Goal: Plan of Care Review  Outcome: Progressing  Flowsheets (Taken 12/01/2023 1727)  Plan of Care Reviewed With: patient  Goal: Patient-Specific Goal (Individualized)  Outcome: Progressing  Goal: Absence of Hospital-Acquired Illness or Injury  Outcome: Progressing  Intervention: Identify and Manage Fall Risk  Recent Flowsheet Documentation  Taken 12/01/2023 0730 by Macdonald Savoy, RN  Safety Interventions:   environmental modification   fall reduction program maintained   lighting adjusted for tasks/safety   low bed  Intervention: Prevent Skin Injury  Recent Flowsheet Documentation  Taken 12/01/2023 0730 by Macdonald Savoy, RN  Positioning for Skin: Supine/Back  Intervention: Prevent and Manage VTE (Venous Thromboembolism) Risk  Recent Flowsheet Documentation  Taken 12/01/2023 1600 by Macdonald Savoy, RN  Anti-Embolism Device Status: (lovenox ) Other (Comment)  Taken 12/01/2023 1400 by Macdonald Savoy, RN  Anti-Embolism Device Status: (lovenox ) Other (Comment)  Taken 12/01/2023 1200 by Macdonald Savoy, RN  Anti-Embolism Device Status: (lovenox ) Other (Comment)  Taken 12/01/2023 1000 by Macdonald Savoy, RN  Anti-Embolism Device Status: (lovenox ) Other (Comment)  Taken 12/01/2023 0800 by Macdonald Savoy, RN  Anti-Embolism Device Status: (lovenox ) Other (Comment)  Taken 12/01/2023 0730 by Macdonald Savoy, RN  Anti-Embolism Device Status: (lovenox ) Other (Comment)  Intervention: Prevent Infection  Recent Flowsheet Documentation  Taken 12/01/2023 0730 by Macdonald Savoy, RN  Infection Prevention:   cohorting utilized   environmental surveillance performed   hand hygiene promoted   rest/sleep promoted  Goal: Optimal Comfort and Wellbeing  Outcome: Progressing  Goal: Readiness for Transition of Care  Outcome: Progressing  Goal: Rounds/Family Conference  Outcome: Progressing     Problem: Bowel Disease, Inflammatory (Ulcerative Colitis or Crohn's Disease)  Goal: Optimal Adaptation to Chronic Illness  Outcome: Progressing  Goal: Diarrhea Symptom Relief  Outcome: Progressing  Goal: Absence of Infection Signs and Symptoms  Outcome: Progressing  Goal: Optimal Nutrition Delivery  Outcome: Progressing  Goal: Optimal Pain Control and Function  Outcome: Progressing

## 2023-12-01 NOTE — Unmapped (Signed)
 Luminal Gastroenterology Consult Service   Progress Note         Assessment and Recommendations:   Jerome Zenisek. 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 The patient is seen in consultation at the request of [facility], MD (Med General Derrel Flies (MDW)) for Crohn's disease.    Patient reports he needs to leave hospital tomorrow. Would continue IV steroids as long as possible and provide PO pred taper on discharge. Outpatient plan to start Rinvoq .    Recommendations:  - Cont IV methylpred 20mg  TID while admitted  - Cont chemical deep vein thrombosis prophylaxis while admitted  - Please order the following labs to be drawn tomorrow AM (preparation for Rinvoq )   - Lipid panel, quant gold, hep surface antigen, core antibody, surface antibody   - Patient scheduled for GI follow up with Dr. Merlyn Starring June 12 at 3:30PM at [facility] (Please put this time, date, location on discharge summary)  - On discharge, prescribe the following pred taper (please include these instructions on discharge summary):  - Prednisone  40 mg daily for 2 weeks  - Prednisone  30 mg daily for 1 week   - Prednisone  20 mg daily for 1 week   - Prednisone  15 mg daily for 1 week  - Prednisone  10 mg daily for 1 week   - Prednisone  5 mg daily for 1 week       Issues Impacting Complexity of Management:  -None    Recommendations discussed with the patient's primary team. We will continue to follow along with you.    Subjective:   - Started IV steroids last night  - 2 BM today, non bloody, formed  - CRP normalized    Objective:   Temp:  [36.6 ??C (97.9 ??F)-36.8 ??C (98.2 ??F)] 36.8 ??C (98.2 ??F)  Pulse:  [72-75] 72  Resp:  [18] 18  BP: (116-128)/(71-77) 117/77  SpO2:  [93 %-100 %] 96 %    Gen: WDWN male in NAD, answers questions appropriately  Abdomen: abdominal hernia. NT/ND  Extremities: No edema in the BLEs    Pertinent Labs & Studies:  -I have reviewed the patient's labs from 12/01/23 which show stable Hgb, stable renal function (SCr, electrolytes), and stable LFTs

## 2023-12-01 NOTE — Unmapped (Signed)
 Pt admitted with exacerbation of crohn's disease day 6. Pt alert oriented x4, VSS. Pt post colonoscopy yesterday. Medicated with dilaudid  for abd pain x1 with good effect. Pt received new PIV and received x1  Solu-medrol . Pt voiced understanding of POC. PT does think he will go home today.   Problem: Adult Inpatient Plan of Care  Goal: Plan of Care Review  Outcome: Progressing  Goal: Patient-Specific Goal (Individualized)  Outcome: Progressing  Flowsheets (Taken 12/01/2023 0114)  Patient/Family-Specific Goals (Include Timeframe): Pt will remain free from falls and report adequate pain control this shift  Individualized Care Needs: monitor labs, vs, pain and nausea managment, fall precautions  Anxieties, Fears or Concerns: none voiced  Goal: Absence of Hospital-Acquired Illness or Injury  Outcome: Progressing  Intervention: Identify and Manage Fall Risk  Recent Flowsheet Documentation  Taken 11/30/2023 2000 by Rob Chihuahua, RN  Safety Interventions:   environmental modification   fall reduction program maintained   lighting adjusted for tasks/safety   nonskid shoes/slippers when out of bed  Intervention: Prevent and Manage VTE (Venous Thromboembolism) Risk  Recent Flowsheet Documentation  Taken 12/01/2023 0045 by Rob Chihuahua, RN  Anti-Embolism Device Status: (lovenox ) Other (Comment)  Taken 11/30/2023 2200 by Rob Chihuahua, RN  Anti-Embolism Device Status: (lovenox ) Other (Comment)  Taken 11/30/2023 2000 by Rob Chihuahua, RN  Anti-Embolism Device Status: (lovenox ) Other (Comment)  Goal: Optimal Comfort and Wellbeing  Outcome: Progressing  Goal: Readiness for Transition of Care  Outcome: Progressing  Goal: Rounds/Family Conference  Outcome: Progressing     Problem: Bowel Disease, Inflammatory (Ulcerative Colitis or Crohn's Disease)  Goal: Optimal Adaptation to Chronic Illness  Outcome: Progressing  Goal: Diarrhea Symptom Relief  Outcome: Progressing  Goal: Absence of Infection Signs and Symptoms  Outcome: Progressing  Goal: Optimal Nutrition Delivery  Outcome: Progressing  Goal: Optimal Pain Control and Function  Outcome: Progressing     Problem: Bowel Disease, Inflammatory (Ulcerative Colitis or Crohn's Disease)  Goal: Optimal Adaptation to Chronic Illness  Outcome: Progressing     Problem: Bowel Disease, Inflammatory (Ulcerative Colitis or Crohn's Disease)  Goal: Optimal Pain Control and Function  Outcome: Progressing     Problem: Bowel Disease, Inflammatory (Ulcerative Colitis or Crohn's Disease)  Goal: Optimal Nutrition Delivery  Outcome: Progressing

## 2023-12-01 NOTE — Unmapped (Signed)
 VENOUS ACCESS ULTRASOUND PROCEDURE NOTE    Indications:   Poor venous access.    The Venous Access Team has assessed this patient for the placement of a PIV. Ultrasound guidance was necessary to obtain access.     Procedure Details:  Identity of the patient was confirmed via name, medical record number and date of birth. The availability of the correct equipment was verified.    The vein was identified for ultrasound catheter insertion.  Field was prepared with necessary supplies and equipment.  Probe cover and sterile gel utilized.  Insertion site was prepped with chlorhexidine solution and allowed to dry.  The catheter extension was primed with normal saline. A(n) 22 gauge 1.75 catheter was placed in the L Forearm with 1attempt(s). See LDA for additional details.    Catheter aspirated, 1 mL blood return present. The catheter was then flushed with 10 mL of normal saline. Insertion site cleansed, and dressing applied per manufacturer guidelines. The catheter was inserted with difficulty by Wynette Heckler, RN.    Primary RN was notified.     Thank you,     Wynette Heckler, RN Venous Access Team   [phone]     Workup / Procedure Time:  30 minutes    See images in  [facility] :

## 2023-12-02 DIAGNOSIS — K50018 Crohn's disease of small intestine with other complication: Principal | ICD-10-CM

## 2023-12-02 LAB — CBC
HEMATOCRIT: 40.5 % (ref 39.0–48.0)
HEMOGLOBIN: 13.1 g/dL (ref 12.9–16.5)
MEAN CORPUSCULAR HEMOGLOBIN CONC: 32.2 g/dL (ref 32.0–36.0)
MEAN CORPUSCULAR HEMOGLOBIN: 26.6 pg (ref 25.9–32.4)
MEAN CORPUSCULAR VOLUME: 82.8 fL (ref 77.6–95.7)
MEAN PLATELET VOLUME: 8.3 fL (ref 6.8–10.7)
PLATELET COUNT: 227 10*9/L (ref 150–450)
RED BLOOD CELL COUNT: 4.9 10*12/L (ref 4.26–5.60)
RED CELL DISTRIBUTION WIDTH: 15.1 % (ref 12.2–15.2)
WBC ADJUSTED: 8.9 10*9/L (ref 3.6–11.2)

## 2023-12-02 LAB — COMPREHENSIVE METABOLIC PANEL
ALBUMIN: 3.2 g/dL — ABNORMAL LOW (ref 3.4–5.0)
ALKALINE PHOSPHATASE: 134 U/L — ABNORMAL HIGH (ref 46–116)
ALT (SGPT): 8 U/L — ABNORMAL LOW (ref 10–49)
ANION GAP: 10 mmol/L (ref 5–14)
AST (SGOT): 10 U/L (ref <=34)
BILIRUBIN TOTAL: 0.2 mg/dL — ABNORMAL LOW (ref 0.3–1.2)
BLOOD UREA NITROGEN: 10 mg/dL (ref 9–23)
BUN / CREAT RATIO: 11
CALCIUM: 8.8 mg/dL (ref 8.7–10.4)
CHLORIDE: 109 mmol/L — ABNORMAL HIGH (ref 98–107)
CO2: 23 mmol/L (ref 20.0–31.0)
CREATININE: 0.92 mg/dL (ref 0.73–1.18)
EGFR CKD-EPI (2021) MALE: >90 mL/min/1.73m2 (ref >=60)
GLUCOSE RANDOM: 113 mg/dL (ref 70–179)
POTASSIUM: 4.4 mmol/L (ref 3.4–4.8)
PROTEIN TOTAL: 6.3 g/dL (ref 5.7–8.2)
SODIUM: 142 mmol/L (ref 135–145)

## 2023-12-02 LAB — LIPID PANEL
CHOLESTEROL: 90 mg/dL (ref <200)
HDL CHOLESTEROL: 40 mg/dL — ABNORMAL LOW (ref >40)
LDL CHOLESTEROL CALCULATED: 38 mg/dL (ref <100)
NON-HDL CHOLESTEROL: 50 mg/dL (ref <130)
TRIGLYCERIDES: 92 mg/dL (ref <150)

## 2023-12-02 LAB — HEPATITIS B CORE ANTIBODY, TOTAL: HEPATITIS B CORE TOTAL ANTIBODY: NONREACTIVE

## 2023-12-02 LAB — HEPATITIS B SURFACE ANTIGEN: HEPATITIS B SURFACE ANTIGEN: NONREACTIVE

## 2023-12-02 LAB — MAGNESIUM: MAGNESIUM: 1.8 mg/dL (ref 1.6–2.6)

## 2023-12-02 LAB — HEPATITIS B SURFACE ANTIBODY
HEPATITIS B SURFACE ANTIBODY QUANT: <8 m[IU]/mL (ref <8.00)
HEPATITIS B SURFACE ANTIBODY: NONREACTIVE

## 2023-12-02 LAB — PHOSPHORUS: PHOSPHORUS: 2.2 mg/dL — ABNORMAL LOW (ref 2.4–5.1)

## 2023-12-02 LAB — C-REACTIVE PROTEIN: C-REACTIVE PROTEIN: <5 mg/L (ref <=10.0)

## 2023-12-02 MED ORDER — VENTOLIN HFA 90 MCG/ACTUATION AEROSOL INHALER
Freq: Four times a day (QID) | RESPIRATORY_TRACT | 0 refills | 0.00000 days | Status: CP | PRN
Start: 2023-12-02 — End: ?
  Filled 2023-12-02: qty 18, 25d supply, fill #0

## 2023-12-02 MED ORDER — ONDANSETRON 4 MG DISINTEGRATING TABLET
ORAL_TABLET | Freq: Three times a day (TID) | 0 refills | 7.00000 days | Status: CP | PRN
Start: 2023-12-02 — End: 2023-12-09
  Filled 2023-12-02: qty 21, 7d supply, fill #0

## 2023-12-02 MED ORDER — OXYCODONE 5 MG TABLET
ORAL_TABLET | ORAL | 0 refills | 2.00000 days | Status: CP | PRN
Start: 2023-12-02 — End: 2023-12-07
  Filled 2023-12-02: qty 10, 2d supply, fill #0

## 2023-12-02 MED ORDER — SYMBICORT 80 MCG-4.5 MCG/ACTUATION HFA AEROSOL INHALER
Freq: Every day | RESPIRATORY_TRACT | 0 refills | 61.00000 days | Status: CP
Start: 2023-12-02 — End: ?
  Filled 2023-12-02: qty 10.2, 60d supply, fill #0

## 2023-12-02 MED ORDER — SULFAMETHOXAZOLE 800 MG-TRIMETHOPRIM 160 MG TABLET
ORAL_TABLET | Freq: Every day | ORAL | 0 refills | 90.00000 days | Status: CN
Start: 2023-12-02 — End: 2024-03-01

## 2023-12-02 MED ORDER — UPADACITINIB ER 30 MG TABLET,EXTENDED RELEASE 24 HR
ORAL_TABLET | Freq: Every day | ORAL | 2 refills | 30.00000 days | Status: CP
Start: 2023-12-02 — End: 2024-03-01
  Filled 2023-12-07: qty 84, 84d supply, fill #0

## 2023-12-02 MED ORDER — RINVOQ 45 MG TABLET,EXTENDED RELEASE
ORAL_TABLET | Freq: Every day | ORAL | 0 refills | 84.00000 days | Status: CP
Start: 2023-12-02 — End: 2024-02-24

## 2023-12-02 MED ADMIN — cariprazine (VRAYLAR) capsule 1.5 mg: 1.5 mg | ORAL | @ 13:00:00 | Stop: 2023-12-02

## 2023-12-02 MED ADMIN — oxyCODONE (ROXICODONE) immediate release tablet 5 mg: 5 mg | ORAL | @ 16:00:00 | Stop: 2023-12-02

## 2023-12-02 MED ADMIN — methylPREDNISolone sodium succinate (PF) (SOLU-Medrol) injection 20 mg: 20 mg | INTRAVENOUS | @ 13:00:00 | Stop: 2023-12-02

## 2023-12-02 MED ADMIN — nicotine (NICODERM CQ) 14 mg/24 hr patch 1 patch: 1 | TRANSDERMAL | @ 13:00:00 | Stop: 2023-12-02

## 2023-12-02 MED ADMIN — oxyCODONE (ROXICODONE) immediate release tablet 5 mg: 5 mg | ORAL | @ 10:00:00 | Stop: 2023-12-02

## 2023-12-02 MED ADMIN — acetaminophen (TYLENOL) tablet 1,000 mg: 1000 mg | ORAL | @ 16:00:00 | Stop: 2023-12-02

## 2023-12-02 MED ADMIN — fluticasone furoate-vilanterol (BREO ELLIPTA) 100-25 mcg/dose inhaler 1 puff: 1 | RESPIRATORY_TRACT | @ 13:00:00 | Stop: 2023-12-02

## 2023-12-02 MED ADMIN — melatonin tablet 3 mg: 3 mg | ORAL | @ 02:00:00

## 2023-12-02 MED ADMIN — enoxaparin (LOVENOX) syringe 40 mg: 40 mg | SUBCUTANEOUS | @ 02:00:00

## 2023-12-02 MED ADMIN — methylPREDNISolone sodium succinate (PF) (SOLU-Medrol) injection 20 mg: 20 mg | INTRAVENOUS | @ 02:00:00

## 2023-12-02 MED ADMIN — cholecalciferol (vitamin D3 25 mcg (1,000 units)) tablet 25 mcg: 25 ug | ORAL | @ 13:00:00 | Stop: 2023-12-02

## 2023-12-02 MED ADMIN — azithromycin (ZITHROMAX) tablet 250 mg: 250 mg | ORAL | @ 13:00:00 | Stop: 2023-12-02

## 2023-12-02 MED ADMIN — ondansetron (ZOFRAN-ODT) disintegrating tablet 4 mg: 4 mg | ORAL | @ 10:00:00 | Stop: 2023-12-02

## 2023-12-02 MED ADMIN — pantoprazole (Protonix) EC tablet 40 mg: 40 mg | ORAL | @ 13:00:00 | Stop: 2023-12-02

## 2023-12-02 MED ADMIN — hydrOXYzine (ATARAX) tablet 25 mg: 25 mg | ORAL | @ 13:00:00 | Stop: 2023-12-02

## 2023-12-02 MED ADMIN — ondansetron (ZOFRAN) injection 4 mg: 4 mg | INTRAVENOUS | @ 16:00:00 | Stop: 2023-12-02

## 2023-12-02 MED ADMIN — QUEtiapine (SEROQUEL) tablet 100 mg: 100 mg | ORAL | @ 02:00:00

## 2023-12-02 NOTE — Unmapped (Signed)
 Physician Discharge Summary [facility]  [address]INE [address] [facility]  [address]  [place] [address]  Dept: [phone]  Loc: [phone]     Identifying Information:   Clayson Deubel  1976-03-08  [id number]    Primary Care Physician: Francois Isaacs, MD     Code Status: Full Code    Admit Date: 11/25/2023    Discharge Date: 12/02/2023     Discharge To: Home    Discharge Service: [facility] - General Medicine Floor Team (MED Sydna Evangelist)     Discharge Attending Physician: Nora Beal Klipstein, MD    Discharge Diagnoses:  Principal Problem:    Exacerbation of Crohn's disease (POA: Yes)  Active Problems:    Crohn's disease (POA: Yes)    Abdominal pain (POA: Yes)    Loose stools (POA: Yes)    Bipolar 1 disorder (POA: Yes)    COPD (chronic obstructive pulmonary disease) (POA: Yes)  Resolved Problems:    * No resolved hospital problems. [facility] Course:   [place]. is a 48 y.o. male whose presentation is complicated by Crohn's disease s/p hemicolectomy, COPD, bipolar disorder that presented to [facility] with abdominal pain, nausea, vomiting, and diarrhea with concern for Crohn's flare.     His hospital course by problem is outlined below:    Crohn's Disease Flare - Hx of CD s/p Hemicolectomy  Presents with abdominal pain, nausea and vomiting, and diarrhea, reports his symptoms have not changed since most recent discharge from [facility] on 4/20 for the same. Currently on Humira  weekly for CD. CT on 4/19 with new multifocal bowel wall thickening and enhancement in the RLQ and sigmoid suspicious for colitis. Stool calprotectin elevated at 717. C diff and GIPP negative. ESR/CRP here negative. Given negative infectious workup, concern for Crohn's flare with other lab and imaging findings. GI planning for colonoscopy once bowel prep is complete. Patient has difficulty with the bowel prep, most recent colonoscopies in 2020 and 2021 were unable to reach ileocolonic anastomosis due to poor prep and colonic anatomy. Started on DVT prophylaxis given increased risk in patients with IBD. Pain and nausea controlled with medications. Completed 3 day bowel prep. Repleted IV magnesium  and phosphate on 4/27 given magnesium  was 1.5 and phosphate 1.8 in the setting of multiple bowel movements. Underwent colonoscopy on 4/28 that was still complicated by stool in the colon but showed ileal inflammation (Rutgeert's i3) without colonic involvement. Received 5 doses of IV methylprednisolone , will discharge on oral prednisone  taper. CRP peaked at 19.9, now <5 on discharge. Pain and nausea improving on discharge. GI plans to change CD maintenance therapy from Humira  to Rinvoq  at follow-up appointment.    COPD Exacerbation  Reports increased cough and sputum production x 1 week. Denies history of COPD exacerbations. Wheezing present on physical exam. CXR clear. Started treatment for COPD exacerbation with prednisone  (will continue prednisone  oral taper for his Crohn's flare) and azithromycin  x 5 days (4/26-4/30). Continued Breo Ellipta  daily inpatient and duo nebs available PRN. Per chart review, unclear whether he has COPD vs asthma. Does have a tobacco use history. Only on Symbicort  in outpatient setting. Recommend getting spirometry done outpatient, it was ordered in June 2024 but does not look like it was completed.    Microcytic Anemia - Hx of Iron  Deficiency Anemia  Hgb low on admission 12.1. Iron  studies last checked 04/2023. Iron  studies repeated here, ferritin 15.2, iron  78. 1000 mg iron  deficit by the Ganzoni equation. S/p  IV dextran on 4/24. Anemia has remained stable.    Bipolar Disorder  Continued home Vraylar  and Seroquel .    GERD  Continued home PPI.      The patient's hospital stay has been complicated by the following clinically significant conditions requiring additional evaluation and treatment or having a significant effect of this patient's care: - Anemia POA requiring further investigation or monitoring Outpatient Provider Follow Up Issues:   [ ]  Recommend PFT testing to determine COPD vs asthma diagnosis, on Symbicort  but reports COPD diagnosis  [ ]  GI follow up appt - Dr. Merlyn Starring June 12 at 3:30PM at [facility]   [ ]  GI planning to change maintenance CD therapy from Humira  to Rinvoq   [ ]  RESULTS pending: hepatitis B serology, quantiferon gold, adalimumab  serology    Touchbase with Outpatient Provider:  Warm Handoff: Completed on 12/02/23 by Marcial Setting, MD (Intern) via [facility] Message    Procedures:  colonoscopy  ______________________________________________________________________  Discharge Medications:      Your Medication List        START taking these medications      ondansetron  4 MG disintegrating tablet  Commonly known as: ZOFRAN -ODT  Dissolve 1 tablet (4 mg total) in the mouth every eight (8) hours as needed for nausea for up to 7 days.     oxyCODONE  5 MG immediate release tablet  Commonly known as: ROXICODONE   Take 1 tablet (5 mg total) by mouth every four (4) hours as needed for up to 5 days.     predniSONE  10 MG tablet  Commonly known as: DELTASONE   Take 4 tablets (40 mg total) by mouth daily for 14 days, THEN 3 tablets (30 mg total) daily for 7 days, THEN 2 tablets (20 mg total) daily for 7 days, THEN 1?? tablets (15 mg total) daily for 7 days, THEN 1 tablet (10 mg total) daily for 7 days, THEN ?? tablets (5 mg total) daily for 7 days.  Start taking on: Dec 03, 2023            CONTINUE taking these medications      cholecalciferol  (vitamin D3 25 mcg (1,000 units)) 1,000 unit (25 mcg) tablet  Take 1 tablet (25 mcg total) by mouth daily.     dicyclomine  10 mg capsule  Commonly known as: BENTYL   Take 1 capsule (10 mg total) by mouth four (4) times a day as needed.     empty container Misc  Use as directed     ferrous sulfate  325 (65 FE) MG tablet  Take 1 tablet (325 mg total) by mouth in the morning.     FLONASE  ALLERGY RELIEF 50 mcg/actuation nasal spray  Generic drug: fluticasone  propionate  2 sprays into each nostril daily as needed.     OPTICHAMBER DIAMOND VHC Spcr  Generic drug: inhalational spacing device     pantoprazole  40 MG tablet  Commonly known as: Protonix   Take 1 tablet (40 mg total) by mouth daily.     QUEtiapine  100 MG tablet  Commonly known as: SEROQUEL   Take 1 tablet (100 mg total) by mouth nightly.     SYMBICORT  80-4.5 mcg/actuation inhaler  Generic drug: budesonide -formoterol   Inhale 2 puffs  in the morning.     VENTOLIN  HFA 90 mcg/actuation inhaler  Generic drug: albuterol   Inhale 2 puffs every six (6) hours as needed.     VRAYLAR  1.5 mg capsule  Generic drug: cariprazine   Take 1 capsule (1.5 mg total) by mouth daily.  ASK your doctor about these medications      RINVOQ  45 mg Tb24  Generic drug: upadacitinib   Take 1 tablet (45 mg total) by mouth daily.     upadacitinib  30 mg tablet  Commonly known as: RINVOQ   Take 1 tablet (30 mg total) by mouth daily.              Allergies:  Morphine  and Toradol [ketorolac]  ______________________________________________________________________  Pending Test Results:  Pending Labs       Order Current Status    Adalimumab /Adalimumab  Ab In process    Hepatitis B Core Antibody, total In process    Hepatitis B Surface Antibody In process    Hepatitis B Surface Antigen In process    Quant TB AG1 value In process    Quant TB AG2 value In process    Quant TB Mitogen value In process    Quant TB Nil value In process    Quantiferon TB Gold Plus In process    Quantiferon TB Gold Plus In process            Most Recent Labs:  All lab results last 24 hours -   Recent Results (from the past 24 hours)   C-reactive protein    Collection Time: 12/02/23  4:03 AM   Result Value Ref Range    CRP <5.0 <=10.0 mg/L   Comprehensive Metabolic Panel    Collection Time: 12/02/23  4:03 AM   Result Value Ref Range    Sodium 142 135 - 145 mmol/L    Potassium 4.4 3.4 - 4.8 mmol/L    Chloride 109 (H) 98 - 107 mmol/L    CO2 23.0 20.0 - 31.0 mmol/L    Anion Gap 10 5 - 14 mmol/L BUN 10 9 - 23 mg/dL    Creatinine 1.66 0.63 - 1.18 mg/dL    BUN/Creatinine Ratio 11     eGFR CKD-EPI (2021) Male >90 >=60 mL/min/1.53m2    Glucose 113 70 - 179 mg/dL    Calcium  8.8 8.7 - 10.4 mg/dL    Albumin 3.2 (L) 3.4 - 5.0 g/dL    Total Protein 6.3 5.7 - 8.2 g/dL    Total Bilirubin 0.2 (L) 0.3 - 1.2 mg/dL    AST 10 <=01 U/L    ALT 8 (L) 10 - 49 U/L    Alkaline Phosphatase 134 (H) 46 - 116 U/L   CBC    Collection Time: 12/02/23  4:03 AM   Result Value Ref Range    WBC 8.9 3.6 - 11.2 10*9/L    RBC 4.90 4.26 - 5.60 10*12/L    HGB 13.1 12.9 - 16.5 g/dL    HCT 60.1 09.3 - 23.5 %    MCV 82.8 77.6 - 95.7 fL    MCH 26.6 25.9 - 32.4 pg    MCHC 32.2 32.0 - 36.0 g/dL    RDW 57.3 22.0 - 25.4 %    MPV 8.3 6.8 - 10.7 fL    Platelet 227 150 - 450 10*9/L   Magnesium  Level    Collection Time: 12/02/23  4:03 AM   Result Value Ref Range    Magnesium  1.8 1.6 - 2.6 mg/dL   Phosphorus Level    Collection Time: 12/02/23  4:03 AM   Result Value Ref Range    Phosphorus 2.2 (L) 2.4 - 5.1 mg/dL   Lipid Panel    Collection Time: 12/02/23  4:03 AM   Result Value Ref Range    Cholesterol, Total  90 <200 mg/dL    Cholesterol, HDL 40 (L) >40 mg/dL    Cholesterol, LDL, Calculated 38 <100 mg/dL    Cholesterol, Non-HDL, Calculated 50 <130 mg/dL    Triglycerides 92 <161 mg/dL    Fasting Unknown        Relevant Studies/Radiology:  Colonoscopy  Result Date: 11/30/2023  _______________________________________________________________________________ Patient Name: Deondrea Harnisch           Procedure Date: 11/30/2023 1:07 PM MRN: [id number]                     Date of Birth: Nov 28, 1975 Admit Type: Inpatient                 Age: 48 Room: [facility]          Gender: Male Note Status: Finalized                Instrument Name: [id number] _______________________________________________________________________________  Procedure:             Colonoscopy Indications:           Disease activity assessment of Crohn's disease of the small bowel and colon Providers:             SYDNEY Bartlett Boroughs, MD, KHUSHABU K. PATEL, LESLIE                        B. LEACH Referring MD:          Medicines:             Propofol  per Anesthesia Complications:         No immediate complications. _______________________________________________________________________________ Procedure:             Pre-Anesthesia Assessment:                        - Prior to the procedure, a History and Physical was                        performed, and patient medications and allergies were                        reviewed. The patient's tolerance of previous                        anesthesia was also reviewed. The risks and benefits                        of the procedure and the sedation options and risks                        were discussed with the patient. All questions were                        answered, and informed consent was obtained. Prior                        Anticoagulants: The patient has taken no anticoagulant                        or antiplatelet agents. ASA Grade Assessment: II - A  patient with mild systemic disease. After reviewing                        the risks and benefits, the patient was deemed in                        satisfactory condition to undergo the procedure.                        After obtaining informed consent, the scope was passed                        under direct vision. Throughout the procedure, the                        patient's blood pressure, pulse, and oxygen                        saturations were monitored continuously. The                        Colonoscope was introduced through the anus and                        advanced to the the ileocolonic anastomosis. The                        ileocolonic anastomosis, the neo-terminal ileum and                        the rectum were photographed. The quality of the bowel                        preparation was inadequate. Findings:      The perianal and digital rectal examinations were normal.      Extensive amounts of stool was found in the entire colon, making      visualization difficult.      There was evidence of a prior end-to-side ileo-colonic anastomosis in      the ascending colon. This was patent and was characterized by very mild      narrowing. The anastomosis was traversed with colonoscope without      difficulty and without dilation effect from scope passage.      The Simple Endoscopic Score for Crohn's Disease was determined based on      the endoscopic appearance of the mucosa in the following segments:      - Ileum: Findings include ulcers greater than 2 cm in size, 10-30%      ulcerated surfaces, 50-75% of surfaces affected and no narrowings.      Segment score: 7. Up to 10cm proximal to the anastomosis was examined      and involved.      - Right Colon: Findings include no ulcers present, no ulcerated      surfaces, no affected surfaces and no narrowings. Segment score: 0.      - Transverse Colon: Findings include no ulcers present, no ulcerated      surfaces, no affected surfaces and no narrowings. Segment score: 0.      - Left Colon: Findings include no ulcers present, no ulcerated surfaces,      no affected surfaces and no  narrowings. Segment score: 0.      - Rectum: Findings include no ulcers present, no ulcerated surfaces, no      affected surfaces and no narrowings. Segment score: 0.      - Total SES-CD aggregate score: 7. Rutgeert's i3 disease.                                                                                Estimated Blood Loss:      Estimated blood loss: none. Impression:            - Preparation of the colon was inadequate.                        - Stool in the entire examined colon.                        - Patent end-to-side ileo-colonic anastomosis,                        characterized by only very mild narrowing.                        - Simple Endoscopic Score for Crohn's Disease: 7,                        mucosal inflammatory changes secondary to Crohn's                        disease with ileitis. Rutgeerts i3. No colonic                        involvement on today's exam.                        - No specimens collected. Recommendation:        - Further recommendations can be made by the GI                        consult team. Please contact them if necessary.                        - Return patient to hospital ward for ongoing care.                        - Resume regular diet today.                        - Continue present medications.                        - Repeat colonoscopy in within 1 year for screening                        purposes as bowl prep today was not adequate for  screening purposes.                                                                                Procedure Code(s):     --- Professional ---                        [phone], Colonoscopy, flexible; diagnostic, including                        collection of specimen(s) by brushing or washing, when                        performed (separate procedure) Diagnosis Code(s):     --- Professional ---                        K50.00, Crohn's disease of small intestine without                        complications                        K50.80, Crohn's disease of both small and large                        intestine without complications CPT copyright 2023 American Medical Association. All rights reserved. The codes documented in this report are preliminary and upon coder review may be revised to meet current compliance requirements. Electronically Signed By Jacolyn Matar, MD _________________________ Arlean Labs, MD 11/30/2023 1:52:17 PM The attending physician was present throughout the entire procedure including the insertion, viewing, and removal of the endoscope. This procedure note has been electronically signed by: Jacolyn Matar , MD Number of Addenda: 0 Note Initiated On: 11/30/2023 1:07 PM    XR Chest Portable  Result Date: 11/28/2023  EXAM: XR CHEST PORTABLE ACCESSION: [id number] UN REPORT DATE: 11/28/2023 1:02 PM CLINICAL INDICATION: COUGH  TECHNIQUE: Single View AP Chest Radiograph. COMPARISON: Chest radiograph 09/28/2023 FINDINGS: Bibasilar linear ectasis or scarring. Otherwise, lungs are clear. No pleural effusion or pneumothorax. Cardiac silhouette is normal in size.     No acute abnormalities.    XR Abdomen 1 View  Result Date: 11/27/2023  EXAM: XR ABDOMEN 1 VIEW ACCESSION: [id number] UN REPORT DATE: 11/27/2023 8:33 AM CLINICAL INDICATION: 48 years old with ABDOMINAL PAIN  -  UNSPECIFIED SITE  COMPARISON: CT abdomen pelvis 11/21/2023 TECHNIQUE: Supine view of the abdomen, 2 image(s) FINDINGS: Mild colonic dilatation, similar to prior CT. There is no small bowel or gaseous distention. Lucency in the region of the central diaphragm likely reflects the bowel loop which is known to be opposed to the ventral abdominal wall. No evidence of pneumoperitoneum. Lung bases are clear. No acute osseous abnormality.     Unchanged mild colonic dilatation.     ECG 12 Lead  Result Date: 11/26/2023  NORMAL SINUS RHYTHM NORMAL ECG WHEN COMPARED WITH ECG OF 31-Oct-2023 07:59, NO SIGNIFICANT CHANGE WAS FOUND    ______________________________________________________________________  Discharge Instructions:   Activity Instructions       Activity  as tolerated                       Follow Up instructions and Outpatient Referrals     Call MD for:  difficulty breathing, headache or visual disturbances      Call MD for:  persistent nausea or vomiting      Call MD for:  severe uncontrolled pain      Call MD for:  temperature >38.5 Celsius      Discharge instructions            ______________________________________________________________________  Discharge Day Services:  BP 136/90  - Pulse 70  - Temp 36.6 ??C (97.9 ??F) (Oral)  - Resp 18  - SpO2 96%     Pt seen on the day of discharge and determined appropriate for discharge.    Condition at Discharge: good    Length of Discharge: I spent greater than 30 mins in the discharge of this patient.

## 2023-12-02 NOTE — Unmapped (Signed)
 Pt admitted with exacerbation of crohns disease day 7, pt alert oriented x4, vss, up ad lib in room. No c/o of pain so far this shift. Pt able to tolerate diet without issue. Pt states he is hoping to dc today. Pt continue with IV solumedrol. Pt voiced understanding of POC.  Problem: Adult Inpatient Plan of Care  Goal: Plan of Care Review  Outcome: Progressing  Goal: Patient-Specific Goal (Individualized)  Outcome: Progressing  Flowsheets (Taken 12/02/2023 0030)  Patient/Family-Specific Goals (Include Timeframe): Pt will remain free from falls and remain hemodynamically stable this shift  Individualized Care Needs: monitor labs, vs pain/nausea management, fall precautions, steroids  Anxieties, Fears or Concerns: wants to go home  Goal: Absence of Hospital-Acquired Illness or Injury  Outcome: Progressing  Intervention: Identify and Manage Fall Risk  Recent Flowsheet Documentation  Taken 12/01/2023 2045 by Rob Chihuahua, RN  Safety Interventions:   environmental modification   fall reduction program maintained   lighting adjusted for tasks/safety   nonskid shoes/slippers when out of bed  Intervention: Prevent and Manage VTE (Venous Thromboembolism) Risk  Recent Flowsheet Documentation  Taken 12/02/2023 0000 by Rob Chihuahua, RN  Anti-Embolism Device Status: (lovenox ) Other (Comment)  Taken 12/01/2023 2230 by Rob Chihuahua, RN  Anti-Embolism Device Status: (lovenox ) Other (Comment)  Taken 12/01/2023 2045 by Rob Chihuahua, RN  Anti-Embolism Device Status: (lovenox ) Other (Comment)  Goal: Optimal Comfort and Wellbeing  Outcome: Progressing  Goal: Readiness for Transition of Care  Outcome: Progressing  Goal: Rounds/Family Conference  Outcome: Progressing     Problem: Bowel Disease, Inflammatory (Ulcerative Colitis or Crohn's Disease)  Goal: Optimal Adaptation to Chronic Illness  Outcome: Progressing  Goal: Diarrhea Symptom Relief  Outcome: Progressing  Goal: Absence of Infection Signs and Symptoms  Outcome: Progressing  Goal: Optimal Nutrition Delivery  Outcome: Progressing  Goal: Optimal Pain Control and Function  Outcome: Progressing     Problem: Bowel Disease, Inflammatory (Ulcerative Colitis or Crohn's Disease)  Goal: Optimal Adaptation to Chronic Illness  Outcome: Progressing     Problem: Bowel Disease, Inflammatory (Ulcerative Colitis or Crohn's Disease)  Goal: Optimal Pain Control and Function  Outcome: Progressing

## 2023-12-02 NOTE — Unmapped (Signed)
 MEDICATION ORDER ENTRY DOCUMENTATION    The following medication(s) have been sent to pharmacy for insurance authorization:    Medication: upadacitinib   Dose & Frequency: 45 mg PO daily x 12 weeks; followed by 30 mg daily  Pharmacy: [facility]       Requesting Physician: Dr. Daria Eddy    Notes: patient has tired and failed Humira  with most recent colonoscopy showing SES-CD 7      Madelene Schanz, PharmD, BCPS, CPP  GI/IBD - Clinical Pharmacist Practitioner

## 2023-12-03 DIAGNOSIS — K50018 Crohn's disease of small intestine with other complication: Principal | ICD-10-CM

## 2023-12-03 MED ORDER — PREDNISONE 10 MG TABLET
ORAL_TABLET | ORAL | 0 refills | 49.00000 days | Status: CP
Start: 2023-12-03 — End: 2024-01-21
  Filled 2024-04-08: qty 10, 5d supply, fill #0

## 2023-12-03 MED ORDER — AZITHROMYCIN 250 MG TABLET
ORAL_TABLET | ORAL | 0 refills | 1.00000 days | Status: CN
Start: 2023-12-03 — End: ?

## 2023-12-04 NOTE — Unmapped (Signed)
 [facility]    Patient Onboarding/Medication Counseling    Jerome Kennedy is a 48 y.o. male with Crohn's Disease who I am counseling today on initiation of therapy.  I am speaking to the patient.    Was a Nurse, learning disability used for this call? No    Verified patient's date of birth / HIPAA.    Specialty medication(s) to be sent: Inflammatory Disorders: Rinvoq       Non-specialty medications/supplies to be sent: n/a      Medications not needed at this time: n/a         Rinvoq  (upadacitinib )    Medication & Administration     Dosage: Crohn's disease: Take 45 mg once daily for 12 weeks then 30 mg once daily    Lab tests required prior to treatment initiation:  Tuberculosis: Tuberculosis screening resulted in a non-reactive Quantiferon TB Gold assay.  Hepatitis B: Hepatitis B serology studies are complete and non-reactive.  Absolute lymphocyte count: ALC greater than 500/mm3.  Absolute neutrophil count: ANC greater than 1,000/mm3.  Hemoglobin: Hemoglobin is greater than 8 g/dL.  Liver function tests: Baseline ALT and AST were evaluated and are documented in the patient chart prior to treatment initiation.  Pregnancy: Patient is male: screening not applicable.      Administration: Take tablets with or without food.  Swallow tablets whole; do not chew, break, or crush.    Adherence/Missed dose instructions:  Take a missed dose as soon as you think about it.  If it has been more than 8-12 hours since your normal dosing time, skip the missed dose and go back to your normal time the following day.  Never take 2 doses at the same time or extra doses in an attempt to 'catch up' after a missed dose.    Goals of Therapy     Achieve symptom remission  Slow disease progression  Protection of remaining articular structures  Maintenance of function  Maintenance of effective psychosocial functioning    Side Effects & Monitoring Parameters     Upset stomach  Signs of a common cold - minor sore throat, runny or stuffy nose, etc.    The following side effects should be reported to the provider:  Signs of a hypersensitivity reaction - rash; hives; itching; red, swollen, blistered, or peeling skin; wheezing; tightness in the chest or throat; difficulty breathing, swallowing, or talking; swelling of the mouth, face, lips, tongue, or throat; etc.  Reduced immune function - report signs of infection such as fever; chills; body aches; very bad sore throat; ear or sinus pain; cough; more sputum or change in color of sputum; pain with passing urine; wound that will not heal, etc.  Also at a slightly higher risk of some malignancies (mainly skin and blood cancers) due to this reduced immune function.  A new skin growth or lump  Stomach pain that is new or worse or change in bowel habits  Signs of a blood clot - chest pain or pressure; coughing up blood; shortness of breath; swelling, warmth, numbness, or pain in a leg or arm      Contraindications, Warnings, & Precautions     Have your bloodwork checked as you have been told by your prescriber  Talk with your doctor if you are pregnant, planning to become pregnant, or breastfeeding - women taking this medication must use birth control while taking this drug and for some time after the last dose  Discuss the possible need for holding your dose(s)  of Rinvoq ?? when a planned procedure is scheduled with the prescriber as it may delay healing/recovery timeline       Drug/Food Interactions     Medication list reviewed in Epic. The patient was instructed to inform the care team before taking any new medications or supplements. No drug interactions identified.   Talk with you prescriber or pharmacist before receiving any live vaccinations while taking this medication and after you stop taking it    Storage, Handling Precautions, & Disposal     Store in the original container at room temperature and protect from moisture.      Current Medications (including OTC/herbals), Comorbidities and Allergies Current Outpatient Medications   Medication Sig Dispense Refill    cariprazine  (VRAYLAR ) 1.5 mg capsule Take 1 capsule (1.5 mg total) by mouth daily.      cholecalciferol , vitamin D3 25 mcg, 1,000 units,, 1,000 unit (25 mcg) tablet Take 1 tablet (25 mcg total) by mouth daily. 30 tablet 0    dicyclomine  (BENTYL ) 10 mg capsule Take 1 capsule (10 mg total) by mouth four (4) times a day as needed.      empty container Misc Use as directed 1 each 3    ferrous sulfate  325 (65 FE) MG tablet Take 1 tablet (325 mg total) by mouth in the morning.      FLONASE  ALLERGY RELIEF 50 mcg/actuation nasal spray 2 sprays into each nostril daily as needed.      ondansetron  (ZOFRAN -ODT) 4 MG disintegrating tablet Dissolve 1 tablet (4 mg total) in the mouth every eight (8) hours as needed for nausea. 21 tablet 0    OPTICHAMBER DIAMOND V[facility] Spcr       oxyCODONE  (ROXICODONE ) 5 MG immediate release tablet Take 1 tablet (5 mg total) by mouth every four (4) hours as needed for pain. 10 tablet 0    pantoprazole  (PROTONIX ) 40 MG tablet Take 1 tablet (40 mg total) by mouth daily. 90 tablet 0    predniSONE  (DELTASONE ) 10 MG tablet Take 4 tablets (40 mg total) by mouth daily for 14 days, THEN 3 tablets (30 mg total) daily for 7 days, THEN 2 tablets (20 mg total) daily for 7 days, THEN 1?? tablets (15 mg total) daily for 7 days, THEN 1 tablet (10 mg total) daily for 7 days, THEN ?? tablets (5 mg total) daily for 7 days. 112 tablet 0    QUEtiapine  (SEROQUEL ) 100 MG tablet Take 1 tablet (100 mg total) by mouth nightly.      SYMBICORT  80-4.5 mcg/actuation inhaler Inhale 2 puffs  in the morning. 10.2 g 0    [Paused] upadacitinib  (RINVOQ ) 30 mg tablet Take 1 tablet (30 mg total) by mouth daily. 30 tablet 2    [Paused] upadacitinib  (RINVOQ ) 45 mg Tb24 Take 1 tablet (45 mg total) by mouth daily. 84 tablet 0    VENTOLIN  HFA 90 mcg/actuation inhaler Inhale 2 puffs every six (6) hours as needed. 18 g 0     No current facility-administered medications for this visit.       Allergies   Allergen Reactions    Morphine  Itching and Rash     He gets light headed    Toradol [Ketorolac] Itching       Patient Active Problem List   Diagnosis    Crohn's disease    Exacerbation of Crohn's disease    Abdominal pain    Loose stools    Anemia    Hypoalbuminemia    Asymptomatic microscopic hematuria  C. 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 Yes    Prescription has been clinically reviewed: Yes      Baseline Quality of Life Assessment      How many days over the past month did your Crohn's  keep you from your normal activities? For example, brushing your teeth or getting up in the morning. Patient declined to answer    Financial Information     Medication Assistance provided: Prior Authorization    Anticipated copay of $4 reviewed with patient. Verified delivery address.    Delivery Information     Scheduled delivery date: 5/5    Expected start date: 5/5      Medication will be delivered via Same Day Courier to the prescription address in Epic [place].  This shipment will not require a signature.      Explained the services we provide at [facility] and that each month we would call to set up refills.  Stressed importance of returning phone calls so that we could ensure they receive their medications in time each month.  Informed patient that we should be setting up refills 7-10 days prior to when they will run out of medication.  A pharmacist will reach out to perform a clinical assessment periodically.  Informed patient that a welcome packet, containing information about our pharmacy and other support services, a Notice of Privacy Practices, and a drug information handout will be sent.      The patient or caregiver noted above participated in the development of this care plan and knows that they can request review of or adjustments to the care plan at any time.      Patient or caregiver verbalized understanding of the above information as well as how to contact the pharmacy at [phone] option 4 with any questions/concerns.  The pharmacy is open Monday through Friday 8:30am-4:30pm.  A pharmacist is available 24/7 via pager to answer any clinical questions they may have.    Patient Specific Needs     Does the patient have any physical, cognitive, or cultural barriers? No    Does the patient have adequate living arrangements? (i.e. the ability to store and take their medication appropriately) Yes    Did you identify any home environmental safety or security hazards? No    Patient prefers to have medications discussed with  Patient     Is the patient or caregiver able to read and understand education materials at a high school level or above? Yes    Patient's primary language is  English     Is the  patient high risk? No    Does the patient have an additional or emergency contact listed in their chart? Yes    SOCIAL DETERMINANTS OF HEALTH     At the [facility], we have learned that life circumstances - like trouble affording food, housing, utilities, or transportation can affect the health of many of our patients.   That is why we wanted to ask: are you currently experiencing any life circumstances that are negatively impacting your health and/or quality of life? Patient declined to answer    Social Drivers of Health     Food Insecurity: No Food Insecurity (11/26/2023)    Hunger Vital Sign     Worried About Running Out of Food in the Last Year: Never true     Ran Out of Food in the Last Year: Never true   Tobacco Use: High Risk (11/28/2023)    Patient History     Smoking Tobacco Use: Every Day     Smokeless Tobacco Use: Never     Passive Exposure: Current   Transportation Needs: No Transportation Needs (11/26/2023)    PRAPARE - Transportation     Lack of Transportation (Medical): No     Lack of Transportation (Non-Medical): No   Alcohol Use: Not on file   Housing: Low Risk  (11/26/2023)    Housing     Within the past 12 months, have you ever stayed: outside, in a car, in a tent, in an overnight shelter, or temporarily in someone else's home (i.e. couch-surfing)?: No     Are you worried about losing your housing?: No   Physical Activity: Not on File (06/21/2021)    Received from [place], [place]    Physical Activity     Physical Activity: 0   Utilities: Low Risk  (11/26/2023)    Utilities     Within the past 12 months, have you been unable to get utilities (heat, electricity) when it was really needed?: No   Stress: Not on File (06/21/2021)    Received from [facility], [place]    Stress     Stress: 0   Interpersonal Safety: Not At Risk (11/15/2023)    Interpersonal Safety     Unsafe Where You Currently Live: No     Physically Hurt by Anyone: No     Abused by Anyone: No   Substance Use: Not on file (06/10/2023)   Intimate Partner Violence: Not At Risk (11/15/2023)    Humiliation, Afraid, Rape, and Kick questionnaire     Fear of Current or Ex-Partner: No     Emotionally Abused: No     Physically Abused: No     Sexually Abused: No   Social Connections: Not on File (04/18/2023)    Received from [employer] [facility]    Social Connections     Connectedness: 0   Financial Resource Strain: Low Risk  (11/26/2023)    Overall Financial Resource Strain (CARDIA)     Difficulty of Paying Living Expenses: Not hard at all   Health Literacy: Not on file   Internet Connectivity: Not on file       Would you be willing to receive help with any of the needs that you have identified today? Not applicable       [facility] Specialty Pharmacist

## 2023-12-04 NOTE — Unmapped (Signed)
 [facility] Specialty Medication Onboarding    Specialty Medication: RINVOQ  45 mg Tb24 (upadacitinib )  Prior Authorization: Approved   Financial Assistance: No - copay  <$25  Final Copay/Day Supply: $4 / 47    Insurance Restrictions: None     Notes to Pharmacist:   Credit Card on File: no  Start Date on Rx:  12/02/23    The triage team has completed the benefits investigation and has determined that the patient is able to fill this medication at [facility]. Please contact the patient to complete the onboarding or follow up with the prescribing physician as needed.    [facility] Specialty Medication Onboarding    Specialty Medication: RINVOQ  30 mg tablet (upadacitinib )  Prior Authorization: Approved   Financial Assistance: No - copay  <$25  Final Copay/Day Supply: $4 / 30    Insurance Restrictions: None     Notes to Pharmacist:   Credit Card on File: no  Start Date on Rx:  12/02/23    The triage team has completed the benefits investigation and has determined that the patient is able to fill this medication at [facility]. Please contact the patient to complete the onboarding or follow up with the prescribing physician as needed.

## 2023-12-20 ENCOUNTER — Emergency Department
Admit: 2023-12-20 | Discharge: 2023-12-21 | Disposition: A | Discharge status: Home / Self Care | Payer: Medicaid (Managed Care)

## 2023-12-20 DIAGNOSIS — K5000 Crohn's disease of small intestine without complications: Principal | ICD-10-CM

## 2023-12-20 LAB — CBC W/ AUTO DIFF
BASOPHILS ABSOLUTE COUNT: 0 10*9/L (ref 0.0–0.1)
BASOPHILS RELATIVE PERCENT: 0.4 %
EOSINOPHILS ABSOLUTE COUNT: 0.1 10*9/L (ref 0.0–0.5)
EOSINOPHILS RELATIVE PERCENT: 1.5 %
HEMATOCRIT: 40.6 % (ref 39.0–48.0)
HEMOGLOBIN: 13 g/dL (ref 12.9–16.5)
LYMPHOCYTES ABSOLUTE COUNT: 2.5 10*9/L (ref 1.1–3.6)
LYMPHOCYTES RELATIVE PERCENT: 35.4 %
MEAN CORPUSCULAR HEMOGLOBIN CONC: 32.1 g/dL (ref 32.0–36.0)
MEAN CORPUSCULAR HEMOGLOBIN: 27 pg (ref 25.9–32.4)
MEAN CORPUSCULAR VOLUME: 84.1 fL (ref 77.6–95.7)
MEAN PLATELET VOLUME: 8.3 fL (ref 6.8–10.7)
MONOCYTES ABSOLUTE COUNT: 0.3 10*9/L (ref 0.3–0.8)
MONOCYTES RELATIVE PERCENT: 4.3 %
NEUTROPHILS ABSOLUTE COUNT: 4.1 10*9/L (ref 1.8–7.8)
NEUTROPHILS RELATIVE PERCENT: 58.4 %
PLATELET COUNT: 139 10*9/L — ABNORMAL LOW (ref 150–450)
RED BLOOD CELL COUNT: 4.82 10*12/L (ref 4.26–5.60)
RED CELL DISTRIBUTION WIDTH: 17 % — ABNORMAL HIGH (ref 12.2–15.2)
WBC ADJUSTED: 7.1 10*9/L (ref 3.6–11.2)

## 2023-12-20 LAB — AST
AST (SGOT): 21 U/L (ref <=34)
AST (SGOT): 16 U/L (ref <=34)

## 2023-12-20 LAB — MAGNESIUM: MAGNESIUM: 1.6 mg/dL (ref 1.6–2.6)

## 2023-12-20 LAB — COMPREHENSIVE METABOLIC PANEL
ALBUMIN: 3.5 g/dL (ref 3.4–5.0)
ALKALINE PHOSPHATASE: 143 U/L — ABNORMAL HIGH (ref 46–116)
ANION GAP: 8 mmol/L (ref 5–14)
BILIRUBIN TOTAL: 0.4 mg/dL (ref 0.3–1.2)
BLOOD UREA NITROGEN: 10 mg/dL (ref 9–23)
BUN / CREAT RATIO: 10
CALCIUM: 8.3 mg/dL — ABNORMAL LOW (ref 8.7–10.4)
CHLORIDE: 113 mmol/L — ABNORMAL HIGH (ref 98–107)
CO2: 21 mmol/L (ref 20.0–31.0)
CREATININE: 0.99 mg/dL (ref 0.73–1.18)
EGFR CKD-EPI (2021) MALE: >90 mL/min/1.73m2 (ref >=60)
GLUCOSE RANDOM: 98 mg/dL (ref 70–179)
PROTEIN TOTAL: 6.5 g/dL (ref 5.7–8.2)
SODIUM: 142 mmol/L (ref 135–145)

## 2023-12-20 LAB — POTASSIUM
POTASSIUM: 4.1 mmol/L (ref 3.4–4.8)
POTASSIUM: 4.2 mmol/L (ref 3.4–4.8)

## 2023-12-20 LAB — LIPASE
LIPASE: 38 U/L (ref 12–53)
LIPASE: 34 U/L (ref 12–53)

## 2023-12-20 LAB — C-REACTIVE PROTEIN: C-REACTIVE PROTEIN: <5 mg/L (ref <=10.0)

## 2023-12-20 LAB — GREEN LITHIUM HEPARIN EXTRA TUBE

## 2023-12-20 LAB — ALT
ALT (SGPT): 16 U/L (ref 10–49)
ALT (SGPT): 12 U/L (ref 10–49)

## 2023-12-20 LAB — LACTATE, VENOUS, WHOLE BLOOD: LACTATE BLOOD VENOUS: 1.1 mmol/L (ref 0.5–1.8)

## 2023-12-20 LAB — SEDIMENTATION RATE: ERYTHROCYTE SEDIMENTATION RATE: <1 mm/h (ref 0–15)

## 2023-12-20 MED ADMIN — iohexol (OMNIPAQUE) 350 mg iodine/mL solution 100 mL: 100 mL | INTRAVENOUS | Stop: 2023-12-20

## 2023-12-20 MED ADMIN — prochlorperazine (COMPAZINE) injection 5 mg: 5 mg | INTRAVENOUS | @ 20:00:00 | Stop: 2023-12-20

## 2023-12-20 MED ADMIN — HYDROmorphone (PF) (DILAUDID) injection 1 mg: 1 mg | INTRAVENOUS | @ 22:00:00 | Stop: 2023-12-20

## 2023-12-20 MED ADMIN — diphenhydrAMINE (BENADRYL) injection 12.5 mg: 12.5 mg | INTRAVENOUS | @ 21:00:00 | Stop: 2023-12-20

## 2023-12-20 MED ADMIN — HYDROmorphone (PF) (DILAUDID) injection 1 mg: 1 mg | INTRAVENOUS | @ 20:00:00 | Stop: 2023-12-20

## 2023-12-20 NOTE — Unmapped (Signed)
 [name] with report of  RLQ abdominal pain, N/V/D. Hx of chron's disease, says it feels like a chron's flare. Also has abdominal hernia.

## 2023-12-20 NOTE — Unmapped (Signed)
 [name] with report of abdominal pain, N/V/D. Hx of chron's disease. Also has abdominal hernia.

## 2023-12-20 NOTE — Unmapped (Signed)
 [facility]  Emergency Department Provider Note     ED Clinical Impression     Final diagnoses:   None      Impression, Medical Decision Making, ED Course     Impression: 48 y.o. male with PMH most significant for Crohn's disease s/p hemicolectomy, COPD, DVT (not on anticoagulation), and bipolar 1 disorder who presents with diffuse abdominal pain that began 1.5-2 hours PTA with associated nausea, chills, bilious emesis, and watery, loose diarrhea, which are consistent with previous Crohn's flares as described below.     DDx/MDM: Chron's flare, intraabdominal infection, UTI, hernia, SBO.     Diagnostic workup as below. Will treat patient with pain medication, nausea medications. .    Orders Placed This Encounter   Procedures    CBC w/ Differential    Comprehensive Metabolic Panel    Sedimentation rate, manual    C-reactive protein    Lipase    Magnesium     Lactate, Venous, Whole Blood            MDM Elements  I have reviewed recent and relevant previous record, including: Inpatient notes - 11/25/2023 ED to Hospital Admission Summary for chart review and past medical history.  Social Determinants that significantly affected care: None Available    ____________________________________________    The case was discussed with the attending physician, who is in agreement with the above assessment and plan.      History     Chief Complaint  Chief Complaint   Patient presents with    Abdominal Pain       HPI   Jerome Kennedy. is a 48 y.o. male with past medical history as below who presents with abdominal pain. The patient reports diffuse abdominal pain that started 1.5-2 hours PTA with associated nausea, non-bloody, but bilious emesis, mild headaches, and chills. He also endorses non-bloody, watery, loose diarrhea that started 4 days ago. Of note, patient states his symptoms feel similar to his prior Crohn's flares. He states taking Humira  for his Crohn's. He does report recent Prednisone  use, however denies any recent antibiotic use. For pain management, he has taken oxycodone  10 mg which has given him mild relief. Patient is followed by outpatient GI at [facility]. Abdominal surgical history includes hernia repair and hemicolectomy. No history of bowel obstruction. Denies fevers, chest pain, shortness of breath, skin rashes, and vision changes.    Per chart review, the patient was recently hospitalized in this hospital from 4/23-4/30/2025 for similar symptoms where he was found to have a Crohn's disease exacerbation. Patient was previously hospitalized on 11/22/2023 where CT abdomen pelvis on 4/19 showed new multifocal bowel wall thickening and enhancement in theRLQ and sigmoid suspicious for colitis. During his 4/23-4/30 admission, patient underwent a colonoscopy that was complicated by stool in the colon but showed ileal inflammation without colonic involvement. He received 5 doses of IV methylprednisolone  and was discharged home on oral prednisone  taper.    Outside Historian(s): I have obtained additional history/collateral from no one.    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    Myalgia     Rhinovirus infection 10/29/2022    SBO (small bowel obstruction)   2011       Past Surgical History:   Procedure Laterality Date    CHOLECYSTECTOMY      COLON SURGERY      partial resection    HERNIA REPAIR      PR ARTHRP INTERCARPAL/CARP/MTCRPL JT INTERPOSITION Left 08/07/2022    Procedure: INTERPOSIT ARTHROPLASTY-INTERCARPAL/CARPOMETACAR;  Surgeon: Adonna Hoots, MD;  Location: MAIN OR [facility];  Service: Orthopedics    PR CLOSE ENTEROSTOMY,RESEC+COLOREC ANAS Midline 11/01/2019    Procedure: CLO ENTEROSTOMY; W/RESECT Lethaniel Rave;  Surgeon: Eusebio High, MD;  Location: MAIN OR [place];  Service: Gastrointestinal    PR COLONOSCOPY FLX DX W/COLLJ SPEC WHEN PFRMD Left 01/07/2013    Procedure: COLONOSCOPY, FLEXIBLE, PROXIMAL TO SPLENIC FLEXURE; DIAGNOSTIC, W/WO COLLECTION SPECIMEN BY BRUSH OR WASH;  Surgeon: Lizbeth Right, MD;  Location: GI PROCEDURES [facility];  Service: Gastroenterology    PR COLONOSCOPY FLX DX W/COLLJ SPEC WHEN PFRMD  03/09/2015    Procedure: COLONOSCOPY, FLEXIBLE, PROXIMAL TO SPLENIC FLEXURE; DIAGNOSTIC, W/WO COLLECTION SPECIMEN BY BRUSH OR WASH;  Surgeon: Delene Feinstein, MD;  Location: GI PROCEDURES [facility];  Service: Gastroenterology    PR COLONOSCOPY FLX DX W/COLLJ SPEC WHEN PFRMD N/A 10/28/2019    Procedure: COLONOSCOPY, FLEXIBLE, PROXIMAL TO SPLENIC FLEXURE; DIAGNOSTIC, W/WO COLLECTION SPECIMEN BY BRUSH OR WASH;  Surgeon: Sarajane Cumming, MD;  Location: GI PROCEDURES [facility];  Service: Gastroenterology    PR COLONOSCOPY FLX DX W/COLLJ SPEC WHEN PFRMD N/A 10/01/2022    Procedure: COLONOSCOPY, FLEXIBLE, PROXIMAL TO SPLENIC FLEXURE; DIAGNOSTIC, W/WO COLLECTION SPECIMEN BY BRUSH OR WASH;  Surgeon: Rana Burr, MD;  Location: GI PROCEDURES [facility];  Service: Gastroenterology    PR COLONOSCOPY FLX DX W/COLLJ SPEC WHEN PFRMD N/A 11/30/2023    Procedure: COLONOSCOPY, FLEXIBLE, PROXIMAL TO SPLENIC FLEXURE; DIAGNOSTIC, W/WO COLLECTION SPECIMEN BY BRUSH OR WASH;  Surgeon: Arlean Labs, MD;  Location: GI PROC[facility]URES [facility];  Service: Gastroenterology    PR EXPLORATORY OF ABDOMEN Midline 09/02/2019    Procedure: EXPLORATORY LAPAROTOMY, EXPLORATORY CELIOTOMY WITH OR WITHOUT BIOPSY(S);  Surgeon: Eusebio High, MD;  Location: MAIN OR [facility];  Service: Gastrointestinal    PR EXPLORATORY OF ABDOMEN Midline 11/01/2019    Procedure: EXPLORATORY LAPAROTOMY, EXPLORATORY CELIOTOMY WITH OR WITHOUT BIOPSY(S);  Surgeon: Eusebio High, MD;  Location: MAIN OR [facility];  Service: Gastrointestinal    PR FIX FINGER,VOLAR PLATE,I-P JT Left 08/07/2022    Procedure: REPAIR AND RECONSTRUCTION, FINGER, VOLAR PLATE, INTERPHALANGEAL JOINT;  Surgeon: Adonna Hoots, MD;  Location: MAIN OR [facility];  Service: Orthopedics    PR FREEING BOWEL ADHESION,ENTEROLYSIS N/A 11/01/2019    Procedure: Enterolysis (Separt Proc);  Surgeon: Eusebio High, MD;  Location: MAIN OR [facility];  Service: Gastrointestinal    PR FUSION MC-P JT Left 08/27/2023    Procedure: ARTHRODESIS, METACARPOPHALANGEAL JOINT, WITH OR WITHOUT INTERNAL FIXATION;  Surgeon: Adonna Hoots, MD;  Location: OR [facility];  Service: Orthopedics    PR ILEOSCOPY THRU STOMA,BIOPSY N/A 10/14/2019    Procedure: Erby Hatcher; Lamarr Pilling 1/MX;  Surgeon: Loel Ring, MD;  Location: GI PROCEDURES [facility];  Service: Gastroenterology    PR PART REMOVAL COLON W COLOSTOMY Midline 09/02/2019    Procedure: COLECTOMY, PARTIAL; WITH SKIN LEVEL CECOSTOMY OR COLOSTOMY;  Surgeon: Eusebio High, MD;  Location: MAIN OR [facility];  Service: Gastrointestinal    PR REPAIR INCISIONAL HERNIA,REDUCIBLE Midline 09/02/2019    Procedure: REPAIR INIT INCISIONAL OR VENTRAL HERNIA; REDUCIBLE;  Surgeon: Caro Christmas, MD;  Location: MAIN OR ;  Service: Trauma    PR UPPER GI ENDOSCOPY,BIOPSY N/A 01/07/2013    Procedure: UGI ENDOSCOPY; WITH BIOPSY, SINGLE OR MULTIPLE;  Surgeon: Lizbeth Right, MD;  Location: GI PROC[facility]URES [facility];  Service: Gastroenterology    PR UPPER GI ENDOSCOPY,BIOPSY N/A 10/14/2019    Procedure: UGI ENDOSCOPY; WITH BIOPSY, SINGLE OR MULTIPLE;  Surgeon: Loel Ring, MD;  Location: GI PROCEDURES [facility];  Service: Gastroenterology    PR UPPER GI ENDOSCOPY,DIAGNOSIS N/A 10/01/2022    Procedure: UGI ENDO, INCLUDE ESOPHAGUS, STOMACH, & DUODENUM &/OR JEJUNUM; DX W/WO COLLECTION SPECIMN, BY BRUSH OR WASH;  Surgeon: Rana Burr, MD;  Location: GI PROCEDURES [facility];  Service: Gastroenterology       No current facility-administered medications for this encounter.    Current Outpatient Medications:     cariprazine  (VRAYLAR ) 1.5 mg capsule, Take 1 capsule (1.5 mg total) by mouth daily., Disp: , Rfl:     cholecalciferol , vitamin D3 25 mcg, 1,000 units,, 1,000 unit (25 mcg) tablet, Take 1 tablet (25 mcg total) by mouth daily., Disp: 30 tablet, Rfl: 0    dicyclomine  (BENTYL ) 10 mg capsule, Take 1 capsule (10 mg total) by mouth four (4) times a day as needed., Disp: , Rfl:     empty container Misc, Use as directed, Disp: 1 each, Rfl: 3    ferrous sulfate  325 (65 FE) MG tablet, Take 1 tablet (325 mg total) by mouth in the morning., Disp: , Rfl:     FLONASE  ALLERGY RELIEF 50 mcg/actuation nasal spray, 2 sprays into each nostril daily as needed., Disp: , Rfl:     OPTICHAMBER DIAMOND [facility] Spcr, , Disp: , Rfl:     pantoprazole  (PROTONIX ) 40 MG tablet, Take 1 tablet (40 mg total) by mouth daily., Disp: 90 tablet, Rfl: 0    predniSONE  (DELTASONE ) 10 MG tablet, Take 4 tablets (40 mg total) by mouth daily for 14 days, THEN 3 tablets (30 mg total) daily for 7 days, THEN 2 tablets (20 mg total) daily for 7 days, THEN 1?? tablets (15 mg total) daily for 7 days, THEN 1 tablet (10 mg total) daily for 7 days, THEN ?? tablets (5 mg total) daily for 7 days., Disp: 112 tablet, Rfl: 0    QUEtiapine  (SEROQUEL ) 100 MG tablet, Take 1 tablet (100 mg total) by mouth nightly., Disp: , Rfl:     SYMBICORT  80-4.5 mcg/actuation inhaler, Inhale 2 puffs  in the morning., Disp: 10.2 g, Rfl: 0    [Paused] upadacitinib  (RINVOQ ) 30 mg tablet, Take 1 tablet (30 mg total) by mouth daily., Disp: 30 tablet, Rfl: 2    [Paused] upadacitinib  (RINVOQ ) 45 mg Tb24, Take 1 tablet (45 mg total) by mouth daily., Disp: 84 tablet, Rfl: 0    VENTOLIN  HFA 90 mcg/actuation inhaler, Inhale 2 puffs every six (6) hours as needed., Disp: 18 g, Rfl: 0    Allergies  Morphine  and Toradol [ketorolac]    Family History  Family History   Problem Relation Age of Onset    Breast cancer Mother     Diabetes Maternal Aunt     Colon cancer Maternal Grandmother     Prostate cancer Maternal Grandfather     Cancer Paternal Grandmother  Crohn's disease Neg Hx        Social History  Social History     Tobacco Use    Smoking status: Every Day     Current packs/day: 0.30     Average packs/day: 0.5 packs/day for 22.2 years (10.5 ttl pk-yrs)     Types: Cigarettes     Start date: 10/02/2001     Passive exposure: Current    Smokeless tobacco: Never    Tobacco comments:     Reduced use from 10cpd to 5cpd, though relights each one. Motivated to quit.    Vaping Use    Vaping status: Some Days    Substances: Nicotine     Passive vaping exposure: Yes   Substance Use Topics    Alcohol use: No     Alcohol/week: 0.0 standard drinks of alcohol    Drug use: Yes     Types: Marijuana     Comment: Smokes 2-3 joints/day        Physical Exam     VITAL SIGNS:      Vitals:    12/20/23 1401   BP: 105/69   Pulse: 83   Resp: 18   Temp: 36.7 ??C (98 ??F)   TempSrc: Oral   SpO2: 95%       Constitutional: Alert and oriented. No acute distress.  Eyes: Conjunctivae are normal.  HEENT: Normocephalic and atraumatic. Conjunctivae clear. No congestion. Moist mucous membranes.   Cardiovascular: Rate as above, regular rhythm. Normal and symmetric distal pulses. Brisk capillary refill. Normal skin turgor. Well-perfused.  Respiratory: Normal respiratory effort. Breath sounds are normal. There are no wheezing or crackles heard.  Gastrointestinal: Reducible hernia present. Diffusely tender in all four quadrants with mild guarding. No rebound.  Genitourinary: Deferred.  Musculoskeletal: Non-tender with normal range of motion in all extremities.  Neurologic: Normal speech and language. No gross focal neurologic deficits are appreciated. Patient is moving all extremities equally, face is symmetric at rest and with speech.  Skin: Skin is warm, dry and intact. No rash noted.  Psychiatric: Mood and affect are normal. Speech and behavior are normal.     Radiology     No orders to display       Pertinent labs & imaging results that were available during my care of the patient were independently interpreted by me and considered in my medical decision making (see chart for details).    Portions of this record have been created using Scientist, clinical (histocompatibility and immunogenetics). Dictation errors have been sought, but may not have been identified and corrected.    Documentation assistance was provided by Clydia Dart, Scribe, on Dec 20, 2023 at 3:10 PM for Charrisse Masley, MD.    Documentation assistance was provided by the scribe in my presence.  The documentation recorded by the scribe has been reviewed by me and accurately reflects the services I personally performed.       Hondo Nanda Wesley, MD  12/20/23 2014

## 2023-12-21 MED ADMIN — acetaminophen (TYLENOL) tablet 1,000 mg: 1000 mg | ORAL | Stop: 2023-12-20

## 2023-12-25 NOTE — Unmapped (Signed)
 Called patient to inquire whether he is seeing the rinvoq  tablet in his stool. He admits he has not been looking at his stools but will start doing so and will let me know before his next appt with me on 6/12. He is feeling much better and thinks the Rinvoq  is helping. Reveals he only started it 1.5 weeks ago, though my initial prescription was made on 4/30...(?). He recently went to ED 5/18 for abd pain but is feeling better since then and has no complaints or concerns.

## 2023-12-27 ENCOUNTER — Inpatient Hospital Stay
Admit: 2023-12-27 | Discharge: 2023-12-31 | Disposition: A | Discharge status: Home Health | Payer: Medicaid (Managed Care)

## 2023-12-27 ENCOUNTER — Ambulatory Visit
Admit: 2023-12-27 | Discharge: 2023-12-31 | Disposition: A | Discharge status: Home Health | Payer: Medicaid (Managed Care)

## 2023-12-27 LAB — COMPREHENSIVE METABOLIC PANEL
ALBUMIN: 3.3 g/dL — ABNORMAL LOW (ref 3.4–5.0)
ALKALINE PHOSPHATASE: 135 U/L — ABNORMAL HIGH (ref 46–116)
ALT (SGPT): 10 U/L (ref 10–49)
ANION GAP: 10 mmol/L (ref 5–14)
AST (SGOT): 20 U/L (ref <=34)
BILIRUBIN TOTAL: 0.4 mg/dL (ref 0.3–1.2)
BLOOD UREA NITROGEN: 10 mg/dL (ref 9–23)
BUN / CREAT RATIO: 9
CALCIUM: 8.3 mg/dL — ABNORMAL LOW (ref 8.7–10.4)
CHLORIDE: 115 mmol/L — ABNORMAL HIGH (ref 98–107)
CO2: 23 mmol/L (ref 20.0–31.0)
CREATININE: 1.17 mg/dL (ref 0.73–1.18)
EGFR CKD-EPI (2021) MALE: 77 mL/min/1.73m2 (ref >=60)
GLUCOSE RANDOM: 116 mg/dL (ref 70–179)
POTASSIUM: 3.7 mmol/L (ref 3.4–4.8)
PROTEIN TOTAL: 5.9 g/dL (ref 5.7–8.2)
SODIUM: 148 mmol/L — ABNORMAL HIGH (ref 135–145)

## 2023-12-27 LAB — CBC W/ AUTO DIFF
BASOPHILS ABSOLUTE COUNT: 0 10*9/L (ref 0.0–0.1)
BASOPHILS RELATIVE PERCENT: 0.5 %
EOSINOPHILS ABSOLUTE COUNT: 0.1 10*9/L (ref 0.0–0.5)
EOSINOPHILS RELATIVE PERCENT: 2.5 %
HEMATOCRIT: 38.6 % — ABNORMAL LOW (ref 39.0–48.0)
HEMOGLOBIN: 12.6 g/dL — ABNORMAL LOW (ref 12.9–16.5)
LYMPHOCYTES ABSOLUTE COUNT: 1.6 10*9/L (ref 1.1–3.6)
LYMPHOCYTES RELATIVE PERCENT: 35.1 %
MEAN CORPUSCULAR HEMOGLOBIN CONC: 32.6 g/dL (ref 32.0–36.0)
MEAN CORPUSCULAR HEMOGLOBIN: 27.1 pg (ref 25.9–32.4)
MEAN CORPUSCULAR VOLUME: 83.3 fL (ref 77.6–95.7)
MEAN PLATELET VOLUME: 8.3 fL (ref 6.8–10.7)
MONOCYTES ABSOLUTE COUNT: 0.3 10*9/L (ref 0.3–0.8)
MONOCYTES RELATIVE PERCENT: 5.6 %
NEUTROPHILS ABSOLUTE COUNT: 2.5 10*9/L (ref 1.8–7.8)
NEUTROPHILS RELATIVE PERCENT: 56.3 %
PLATELET COUNT: 133 10*9/L — ABNORMAL LOW (ref 150–450)
RED BLOOD CELL COUNT: 4.63 10*12/L (ref 4.26–5.60)
RED CELL DISTRIBUTION WIDTH: 16.7 % — ABNORMAL HIGH (ref 12.2–15.2)
WBC ADJUSTED: 4.5 10*9/L (ref 3.6–11.2)

## 2023-12-27 LAB — C-REACTIVE PROTEIN: C-REACTIVE PROTEIN: <5 mg/L (ref <=10.0)

## 2023-12-27 LAB — LIPASE: LIPASE: 40 U/L (ref 12–53)

## 2023-12-27 LAB — MAGNESIUM: MAGNESIUM: 1.7 mg/dL (ref 1.6–2.6)

## 2023-12-27 MED ADMIN — ondansetron (ZOFRAN) injection 4 mg: 4 mg | INTRAVENOUS | Stop: 2023-12-27

## 2023-12-27 MED ADMIN — lactated ringers bolus 1,000 mL: 1000 mL | INTRAVENOUS | Stop: 2023-12-27

## 2023-12-27 MED ADMIN — diphenhydrAMINE (BENADRYL) injection: 12.5 mg | INTRAVENOUS | Stop: 2023-12-27

## 2023-12-27 MED ADMIN — acetaminophen (OFIRMEV) 10 mg/mL injection 1,000 mg: 1000 mg | INTRAVENOUS | Stop: 2023-12-27

## 2023-12-27 NOTE — Unmapped (Signed)
 Patient coming in for RLQ abdominal pain that began yesterday and is progressively getting worse. Hx of Chron's. Endorsing vomiting x 2 and nausea.

## 2023-12-27 NOTE — Unmapped (Signed)
 [facility]  Emergency Department Provider Note     HPI     Jerome Kennedy. is a pleasant 48 y.o. male with a PMH of anemia, Crohn's disease (s/p hemicolectomy), cholecystectomy, COPD, DVT, and bipolar 1 disorder who presents via EMS with abdominal pain. The patient reports 1 day of worsening RLQ abdominal pain which onset 15 minutes after a BM yesterday, as well as  associated nausea and 2 episodes of green-colored emesis. He states his pain feels similar to prior Chron's flare-ups. Upon interview, he endorses chills earlier today. He has been compliant with his Crohn's medications. Of note, the patient was seen in this ED on 05/18 for similar symptoms, where he had a CT A/P, which revealed hyper enhancement and wall thickening of neoterminal ileum which was seen with active enteritis, attributed to his Crohn's disease. Denies fever.    MDM     BP 101/70  - Pulse 78  - Temp 37 ??C (98.6 ??F) (Oral)  - Resp 16  - Wt 65.8 kg (145 lb)  - SpO2 97%  - BMI 22.05 kg/m??      Hemodynamically stable. On exam, patient is overall well-appearing in no acute distress.  Regular rate and rhythm, no increased work of breathing abdomen, tender in the right lower quadrant, with large ventral hernia, easily reducible, 2+ pulses in distal extremities, no leg swelling. Ddx: Patient with symptoms most likely consistent with Crohn's flare, additionally could consider other intra-abdominal pathology such as small bowel obstruction, constipation, colitis, appendicitis, cholecystitis however patient's gallbladder is surgically absent.  Diagnostic workup as below. Disposition pending further work-up. See progress notes below.     Orders Placed This Encounter   Procedures    CT Abdomen Pelvis W IV Contrast    CBC w/ Differential    Comprehensive Metabolic Panel    Lipase    Magnesium     Urinalysis with Microscopy with Culture Reflex (Clean Catch)    ECG 12 Lead    Insert peripheral IV    ED Admit Decision       ED Course as of 12/27/23 2320 Sun Dec 27, 2023   2000 HGB(!): 12.6  baseline   2006 Magnesium : 1.7   2007 Lipase: 40   2008 Sodium(!): 148   2009 Chloride(!): 115   2203 CT Abdomen Pelvis W IV Contrast  IMPRESSION:  Mural thickening and hyperenhancement involving the distal ileum, which may reflect persistent or recurrent ileitis. No evidence of bowel obstruction.     Chronic and incidental findings as noted above.     2254 I discussed the results of evaluation with the patient, will attempt a little bit more pain control, p.o. trial, and if he tolerates this he is agreeable plans to go home otherwise we will plan for admission.   2304 Patient unable to take p.o., with intractable abdominal pain, and likely in setting of Crohn's flare, concern he will not be able to take his medications appropriately, will page MAO for admission.       The case was discussed with the attending physician who is in agreement with the above assessment and plan.    - Any discussion of this patient's case/presentation between myself and consultants, admitting teams, or other team members has been documented above.  - Imaging and other studies: N/A  - External records reviewed: 11/25/23 Discharge summary for PMH  - Consideration of admission, observation, transfer, or escalation of care: Patient is appropriate for admission given the findings noted in the ED  course and MDM.     ED Clinical Impression     Final diagnoses:   Abdominal pain, unspecified abdominal location (Primary)   Nausea and vomiting, unspecified vomiting type   Unable to eat   Crohn's disease of ileum with complication          Physical Exam     Vitals:    12/27/23 1829 12/27/23 2044   BP: 105/66 101/70   Pulse: 83 78   Resp: 17 16   Temp: 37 ??C (98.6 ??F)    TempSrc: Oral    SpO2: 96% 97%   Weight: 65.8 kg (145 lb)         Constitutional: In no acute distress.  Eyes: Conjunctivae are normal. EOMI.   HEENT: Normocephalic and atraumatic. Mucous membranes are moist.   Neck: Full active range of motion  Cardiovascular: See heart rate listed above.  Normal skin perfusion.  2+ pulses in distal extremities, no lower extremity edema.   Respiratory: See respiratory rate listed above.   Speaking easily in full sentences  Gastrointestinal: Exam as above  Musculoskeletal: No long bone deformities.   Neurologic: Normal speech and language. No gross focal neurologic deficits are appreciated.   Skin: Skin is warm, dry and intact.  Psychiatric: Mood and affect are normal.     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    Myalgia     Rhinovirus infection 10/29/2022    SBO (small bowel obstruction)   2011       Past Surgical History:   Procedure Laterality Date    CHOLECYSTECTOMY      COLON SURGERY      partial resection    HERNIA REPAIR      PR ARTHRP INTERCARPAL/CARP/MTCRPL JT INTERPOSITION Left 08/07/2022    Procedure: INTERPOSIT ARTHROPLASTY-INTERCARPAL/CARPOMETACAR;  Surgeon: Adonna Hoots, MD;  Location: MAIN OR [facility];  Service: Orthopedics    PR CLOSE ENTEROSTOMY,RESEC+COLOREC ANAS Midline 11/01/2019    Procedure: CLO ENTEROSTOMY; W/RESECT Lethaniel Rave;  Surgeon: Eusebio High, MD;  Location: MAIN OR [place];  Service: Gastrointestinal    PR COLONOSCOPY FLX DX W/COLLJ SPEC WHEN PFRMD Left 01/07/2013    Procedure: COLONOSCOPY, FLEXIBLE, PROXIMAL TO SPLENIC FLEXURE; DIAGNOSTIC, W/WO COLLECTION SPECIMEN BY BRUSH OR WASH;  Surgeon: Lizbeth Right, MD;  Location: GI PROC[facility]URES [facility];  Service: Gastroenterology    PR COLONOSCOPY FLX DX W/COLLJ SPEC WHEN PFRMD  03/09/2015    Procedure: COLONOSCOPY, FLEXIBLE, PROXIMAL TO SPLENIC FLEXURE; DIAGNOSTIC, W/WO COLLECTION SPECIMEN BY BRUSH OR WASH;  Surgeon: Delene Feinstein, MD;  Location: GI PROCEDURES [facility];  Service: Gastroenterology    PR COLONOSCOPY FLX DX W/COLLJ SPEC WHEN PFRMD N/A 10/28/2019    Procedure: COLONOSCOPY, FLEXIBLE, PROXIMAL TO SPLENIC FLEXURE; DIAGNOSTIC, W/WO COLLECTION SPECIMEN BY BRUSH OR WASH;  Surgeon: Sarajane Cumming, MD;  Location: GI PROCEDURES MEMORIAL  [facility];  Service: Gastroenterology    PR COLONOSCOPY FLX DX W/COLLJ SPEC WHEN PFRMD N/A 10/01/2022    Procedure: COLONOSCOPY, FLEXIBLE, PROXIMAL TO SPLENIC FLEXURE; DIAGNOSTIC, W/WO COLLECTION SPECIMEN BY BRUSH OR WASH;  Surgeon: Rana Burr, MD;  Location: GI PROCEDURES [facility];  Service: Gastroenterology    PR COLONOSCOPY FLX DX W/COLLJ SPEC WHEN PFRMD N/A 11/30/2023    Procedure: COLONOSCOPY, FLEXIBLE, PROXIMAL TO SPLENIC FLEXURE; DIAGNOSTIC, W/WO COLLECTION SPECIMEN BY BRUSH OR WASH;  Surgeon: Arlean Labs, MD;  Location: GI PROCEDURES [facility];  Service: Gastroenterology    PR EXPLORATORY OF ABDOMEN Midline 09/02/2019    Procedure: EXPLORATORY LAPAROTOMY, EXPLORATORY CELIOTOMY WITH OR WITHOUT BIOPSY(S);  Surgeon: Eusebio High, MD;  Location: MAIN OR [facility];  Service: Gastrointestinal    PR EXPLORATORY OF ABDOMEN Midline 11/01/2019    Procedure: EXPLORATORY LAPAROTOMY, EXPLORATORY CELIOTOMY WITH OR WITHOUT BIOPSY(S);  Surgeon: Eusebio High, MD;  Location: MAIN OR [facility];  Service: Gastrointestinal    PR FIX FINGER,VOLAR PLATE,I-P JT Left 08/07/2022    Procedure: REPAIR AND RECONSTRUCTION, FINGER, VOLAR PLATE, INTERPHALANGEAL JOINT;  Surgeon: Adonna Hoots, MD;  Location: MAIN OR [facility];  Service: Orthopedics    PR FREEING BOWEL ADHESION,ENTEROLYSIS N/A 11/01/2019    Procedure: Enterolysis (Separt Proc);  Surgeon: Eusebio High, MD;  Location: MAIN OR [facility];  Service: Gastrointestinal    PR FUSION MC-P JT Left 08/27/2023    Procedure: ARTHRODESIS, METACARPOPHALANGEAL JOINT, WITH OR WITHOUT INTERNAL FIXATION;  Surgeon: Adonna Hoots, MD;  Location: OR [facility];  Service: Orthopedics    PR ILEOSCOPY THRU STOMA,BIOPSY N/A 10/14/2019    Procedure: Erby Hatcher; Lamarr Pilling 1/MX;  Surgeon: Loel Ring, MD;  Location: GI PROCEDURES [facility];  Service: Gastroenterology    PR PART REMOVAL COLON W COLOSTOMY Midline 09/02/2019    Procedure: COLECTOMY, PARTIAL; WITH SKIN LEVEL CECOSTOMY OR COLOSTOMY;  Surgeon: Eusebio High, MD;  Location: MAIN OR [facility];  Service: Gastrointestinal    PR REPAIR INCISIONAL HERNIA,REDUCIBLE Midline 09/02/2019    Procedure: REPAIR INIT INCISIONAL OR VENTRAL HERNIA; REDUCIBLE;  Surgeon: Caro Christmas, MD;  Location: MAIN OR ;  Service: Trauma    PR UPPER GI ENDOSCOPY,BIOPSY N/A 01/07/2013    Procedure: UGI ENDOSCOPY; WITH BIOPSY, SINGLE OR MULTIPLE;  Surgeon: Lizbeth Right, MD;  Location: GI PROCEDURES [facility];  Service: Gastroenterology    PR UPPER GI ENDOSCOPY,BIOPSY N/A 10/14/2019    Procedure: UGI ENDOSCOPY; WITH BIOPSY, SINGLE OR MULTIPLE;  Surgeon: Loel Ring, MD;  Location: GI PROCEDURES [facility];  Service: Gastroenterology    PR UPPER GI ENDOSCOPY,DIAGNOSIS N/A 10/01/2022    Procedure: UGI ENDO, INCLUDE ESOPHAGUS, STOMACH, & DUODENUM &/OR JEJUNUM; DX W/WO COLLECTION SPECIMN, BY BRUSH OR WASH;  Surgeon: Rana Burr, MD;  Location: GI PROCEDURES [facility];  Service: Gastroenterology       MEDICATIONS:   No current facility-administered medications for this encounter.    Current Outpatient Medications:     cariprazine  (VRAYLAR ) 1.5 mg capsule, Take 1 capsule (1.5 mg total) by mouth daily., Disp: , Rfl:     cholecalciferol , vitamin D3 25 mcg, 1,000 units,, 1,000 unit (25 mcg) tablet, Take 1 tablet (25 mcg total) by mouth daily., Disp: 30 tablet, Rfl: 0    dicyclomine  (BENTYL ) 10 mg capsule, Take 1 capsule (10 mg total) by mouth four (4) times a day as needed., Disp: , Rfl:     empty container Misc, Use as directed, Disp: 1 each, Rfl: 3    ferrous  sulfate 325 (65 FE) MG tablet, Take 1 tablet (325 mg total) by mouth in the morning., Disp: , Rfl:     FLONASE  ALLERGY RELIEF 50 mcg/actuation nasal spray, 2 sprays into each nostril daily as needed., Disp: , Rfl:     OPTICHAMBER DIAMOND VHC Spcr, , Disp: , Rfl:     pantoprazole  (PROTONIX ) 40 MG tablet, Take 1 tablet (40 mg total) by mouth daily., Disp: 90 tablet, Rfl: 0    predniSONE  (DELTASONE ) 10 MG tablet, Take 4 tablets (40 mg total) by mouth daily for 14 days, THEN 3 tablets (30 mg total) daily for 7 days, THEN 2 tablets (20 mg total) daily for 7 days, THEN 1?? tablets (15 mg total) daily for 7 days, THEN 1 tablet (10 mg total) daily for 7 days, THEN ?? tablets (5 mg total) daily for 7 days., Disp: 112 tablet, Rfl: 0    QUEtiapine  (SEROQUEL ) 100 MG tablet, Take 1 tablet (100 mg total) by mouth nightly., Disp: , Rfl:     SYMBICORT  80-4.5 mcg/actuation inhaler, Inhale 2 puffs  in the morning., Disp: 10.2 g, Rfl: 0    [Paused] upadacitinib  (RINVOQ ) 30 mg tablet, Take 1 tablet (30 mg total) by mouth daily., Disp: 30 tablet, Rfl: 2    [Paused] upadacitinib  (RINVOQ ) 45 mg Tb24, Take 1 tablet (45 mg total) by mouth daily., Disp: 84 tablet, Rfl: 0    VENTOLIN  HFA 90 mcg/actuation inhaler, Inhale 2 puffs every six (6) hours as needed., Disp: 18 g, Rfl: 0    ALLERGIES:   Morphine  and Toradol [ketorolac]    SOCIAL HISTORY:   Social History     Tobacco Use    Smoking status: Every Day     Current packs/day: 0.30     Average packs/day: 0.5 packs/day for 22.2 years (10.5 ttl pk-yrs)     Types: Cigarettes     Start date: 10/02/2001     Passive exposure: Current    Smokeless tobacco: Never    Tobacco comments:     Reduced use from 10cpd to 5cpd, though relights each one. Motivated to quit.    Substance Use Topics    Alcohol use: No     Alcohol/week: 0.0 standard drinks of alcohol       FAMILY HISTORY:  Family History   Problem Relation Age of Onset    Breast cancer Mother     Diabetes Maternal Aunt     Colon cancer Maternal Grandmother     Prostate cancer Maternal Grandfather     Cancer Paternal Grandmother     Crohn's disease Neg Hx          Radiology     CT Abdomen Pelvis W IV Contrast   Final Result   Mural thickening and hyperenhancement involving the distal ileum, which may reflect persistent or recurrent ileitis. No evidence of bowel obstruction.      Chronic and incidental findings as noted above.              Laboratory Data     Lab Results   Component Value Date    WBC 4.5 12/27/2023    HGB 12.6 (L) 12/27/2023    HCT 38.6 (L) 12/27/2023    PLT 133 (L) 12/27/2023       Lab Results   Component Value Date    NA 148 (H) 12/27/2023    K 3.7 12/27/2023    CL 115 (H) 12/27/2023    CO2 23.0 12/27/2023    BUN 10  12/27/2023    CREATININE 1.17 12/27/2023    GLU 116 12/27/2023    CALCIUM  8.3 (L) 12/27/2023    MG 1.7 12/27/2023    PHOS 2.2 (L) 12/02/2023       Lab Results   Component Value Date    BILITOT 0.4 12/27/2023    BILIDIR 0.20 04/26/2023    PROT 5.9 12/27/2023    ALBUMIN 3.3 (L) 12/27/2023    ALT 10 12/27/2023    AST 20 12/27/2023    ALKPHOS 135 (H) 12/27/2023    GGT 57 01/05/2013       Lab Results   Component Value Date    LABPROT 12.0 01/08/2013    INR 0.99 10/18/2023    APTT 22.4 (L) 10/18/2023       Lynett Sarah DO  PGY2 EM    Portions of this record have been created using Dragon dictation software. Dictation errors have been sought, but may not have been identified and corrected.    Documentation assistance was provided by Norma Beckers and Emmanuel Hark, Scribes, on Dec 27, 2023 at 7:12 PM for [facility], DO.    Documentation assistance was provided by the scribe in my presence.  The documentation recorded by the scribe has been reviewed by me and accurately reflects the services I personally performed. Edits were made as necessary.    Lynett Sarah, DO       Lesia Monica P, [place]  Resident  12/27/23 [phone]

## 2023-12-27 NOTE — Unmapped (Signed)
 BIB [facility] for abd pain with vomiting. Patient feels this is similar to his chron's flairs. Patient given 50 mcg of fentanyl . vss

## 2023-12-28 LAB — URINALYSIS WITH MICROSCOPY WITH CULTURE REFLEX PERFORMABLE
BACTERIA: NONE SEEN /HPF
BILIRUBIN UA: NEGATIVE
BLOOD UA: NEGATIVE
GLUCOSE UA: NEGATIVE
KETONES UA: NEGATIVE
LEUKOCYTE ESTERASE UA: NEGATIVE
NITRITE UA: NEGATIVE
PH UA: 6.5 (ref 5.0–9.0)
PROTEIN UA: NEGATIVE
RBC UA: 1 /HPF (ref <=3)
SPECIFIC GRAVITY UA: 1.054 — ABNORMAL HIGH (ref 1.016–1.022)
SQUAMOUS EPITHELIAL: <1 /HPF (ref 0–5)
UROBILINOGEN UA: <2
WBC UA: <1 /HPF (ref <=2)

## 2023-12-28 LAB — CBC
HEMATOCRIT: 38.6 % — ABNORMAL LOW (ref 39.0–48.0)
HEMOGLOBIN: 12.6 g/dL — ABNORMAL LOW (ref 12.9–16.5)
MEAN CORPUSCULAR HEMOGLOBIN CONC: 32.6 g/dL (ref 32.0–36.0)
MEAN CORPUSCULAR HEMOGLOBIN: 27.2 pg (ref 25.9–32.4)
MEAN CORPUSCULAR VOLUME: 83.5 fL (ref 77.6–95.7)
MEAN PLATELET VOLUME: 8.8 fL (ref 6.8–10.7)
PLATELET COUNT: 135 10*9/L — ABNORMAL LOW (ref 150–450)
RED BLOOD CELL COUNT: 4.61 10*12/L (ref 4.26–5.60)
RED CELL DISTRIBUTION WIDTH: 17.4 % — ABNORMAL HIGH (ref 12.2–15.2)
WBC ADJUSTED: 5.2 10*9/L (ref 3.6–11.2)

## 2023-12-28 LAB — BASIC METABOLIC PANEL
ANION GAP: 7 mmol/L (ref 5–14)
BLOOD UREA NITROGEN: 10 mg/dL (ref 9–23)
BUN / CREAT RATIO: 9
CALCIUM: 8.2 mg/dL — ABNORMAL LOW (ref 8.7–10.4)
CHLORIDE: 116 mmol/L — ABNORMAL HIGH (ref 98–107)
CO2: 24 mmol/L (ref 20.0–31.0)
CREATININE: 1.08 mg/dL (ref 0.73–1.18)
EGFR CKD-EPI (2021) MALE: 85 mL/min/1.73m2 (ref >=60)
GLUCOSE RANDOM: 86 mg/dL (ref 70–179)
POTASSIUM: 4.1 mmol/L (ref 3.4–4.8)
SODIUM: 147 mmol/L — ABNORMAL HIGH (ref 135–145)

## 2023-12-28 LAB — SEDIMENTATION RATE: ERYTHROCYTE SEDIMENTATION RATE: <1 mm/h (ref 0–15)

## 2023-12-28 LAB — MAGNESIUM: MAGNESIUM: 1.7 mg/dL (ref 1.6–2.6)

## 2023-12-28 MED ADMIN — HYDROmorphone (PF) (DILAUDID) injection 1 mg: 1 mg | INTRAVENOUS | @ 10:00:00 | Stop: 2023-12-28

## 2023-12-28 MED ADMIN — HYDROmorphone (PF) (DILAUDID) injection 1 mg: 1 mg | INTRAVENOUS | @ 20:00:00 | Stop: 2024-01-10

## 2023-12-28 MED ADMIN — fluticasone furoate-vilanterol (BREO ELLIPTA) 100-25 mcg/dose inhaler 1 puff: 1 | RESPIRATORY_TRACT | @ 12:00:00

## 2023-12-28 MED ADMIN — HYDROmorphone (DILAUDID) injection Syrg 0.5 mg: .5 mg | INTRAVENOUS | @ 01:00:00 | Stop: 2023-12-27

## 2023-12-28 MED ADMIN — prochlorperazine (COMPAZINE) injection 10 mg: 10 mg | INTRAVENOUS | @ 01:00:00 | Stop: 2023-12-27

## 2023-12-28 MED ADMIN — dextrose 5 % infusion: 100 mL/h | INTRAVENOUS | @ 13:00:00 | Stop: 2023-12-28

## 2023-12-28 MED ADMIN — ferrous sulfate tablet 325 mg: 325 mg | ORAL | @ 12:00:00

## 2023-12-28 MED ADMIN — HYDROmorphone (PF) (DILAUDID) injection 1 mg: 1 mg | INTRAVENOUS | @ 13:00:00 | Stop: 2024-01-10

## 2023-12-28 MED ADMIN — acetaminophen (TYLENOL) tablet 1,000 mg: 1000 mg | ORAL | @ 21:00:00

## 2023-12-28 MED ADMIN — QUEtiapine (SEROQUEL) tablet 100 mg: 100 mg | ORAL | @ 04:00:00

## 2023-12-28 MED ADMIN — HYDROmorphone (DILAUDID) injection Syrg 0.5 mg: .5 mg | INTRAVENOUS | @ 02:00:00 | Stop: 2023-12-27

## 2023-12-28 MED ADMIN — predniSONE (DELTASONE) tablet 20 mg: 20 mg | ORAL | @ 12:00:00 | Stop: 2023-12-31

## 2023-12-28 MED ADMIN — cariprazine (VRAYLAR) capsule 1.5 mg: 1.5 mg | ORAL | @ 12:00:00

## 2023-12-28 MED ADMIN — dextrose 5 % infusion: 100 mL/h | INTRAVENOUS | @ 17:00:00 | Stop: 2023-12-28

## 2023-12-28 MED ADMIN — enoxaparin (LOVENOX) syringe 40 mg: 40 mg | SUBCUTANEOUS | @ 04:00:00

## 2023-12-28 MED ADMIN — HYDROmorphone (PF) (DILAUDID) injection 1 mg: 1 mg | INTRAVENOUS | @ 23:00:00 | Stop: 2024-01-10

## 2023-12-28 MED ADMIN — iohexol (OMNIPAQUE) 350 mg iodine/mL solution 100 mL: 100 mL | INTRAVENOUS | @ 01:00:00 | Stop: 2023-12-27

## 2023-12-28 MED ADMIN — prochlorperazine (COMPAZINE) injection 5 mg: 5 mg | INTRAVENOUS | @ 10:00:00

## 2023-12-28 MED ADMIN — pantoprazole (Protonix) EC tablet 40 mg: 40 mg | ORAL | @ 12:00:00

## 2023-12-28 MED ADMIN — cholecalciferol (vitamin D3 25 mcg (1,000 units)) tablet 25 mcg: 25 ug | ORAL | @ 12:00:00

## 2023-12-28 MED ADMIN — acetaminophen (TYLENOL) tablet 650 mg: 650 mg | ORAL | @ 16:00:00 | Stop: 2023-12-28

## 2023-12-28 MED ADMIN — prochlorperazine (COMPAZINE) injection 5 mg: 5 mg | INTRAVENOUS | @ 17:00:00

## 2023-12-28 MED ADMIN — HYDROmorphone (PF) (DILAUDID) injection 1 mg: 1 mg | INTRAVENOUS | @ 17:00:00 | Stop: 2024-01-10

## 2023-12-28 NOTE — Unmapped (Signed)
 [facility]   Initial Consultation         Assessment and Recommendations:   Jerome Dues. is a 48 y.o. 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 The patient is seen in consultation at the request of Eve Hinders, MD ([facility]MD[facility])) for Crohn's disease.    C/f CD flare vs persistent ileitis  Pt w/ long history of CD and multiple hospitalizations for abdominal pain. This admission with similar symptoms and findings on CT. Unclear if true flare vs persistent disease iso rinvoq  induction. Also unclear if patient is truly absorbing rinvoq  as he has h/o SGS and endorses seeing pill in stool. Reassured that labs are stable. Continue current taper.   - please have patient ask someone to bring in his rinvoq  from home and give to pharmacy to continue induction   []  will consider route of administration if truly seeing pill in stool  - continue current pred taper 20mg  every day   []  pending symptoms may restart taper at higher dose  - pending stool studies  - trend CBC, CMP, CRP daily  - DVT ppx    Issues Impacting Complexity of Management:  -None    Recommendations discussed with the patient's primary team. We will continue to follow along with you.    Subjective:     83M presenting with 1 d abd pain and diarrhea. Notably patient has multiple visits for similar symptoms. Previously has had active disease on endoscopy and inflammation on CTAP. This time VSS, CTAP notable for mural thickening and hyperenhancement involving the distal ileum which may reflect persistent or recurrent ileitis. VSS, labs wnl.     Patient says that he has been on rinvoq  for around 1 week, notes that he seen the pill in his stool recently. Denies any blood in stool. Recent admission 4/29 for similar symptoms, started on pred taper, continued on it outpatient per patient.     -I have reviewed the patient's prior records from this admission and previous office visits as summarized in the HPI    Objective:   Temp:  [36.4 ??C (97.5 ??F)-37 ??C (98.6 ??F)] 36.4 ??C (97.5 ??F)  Pulse:  [69-85] 69  SpO2 Pulse:  [73-81] 73  Resp:  [8-22] 18  BP: (97-126)/(60-88) 126/82  SpO2:  [96 %-99 %] 99 %    Gen: WDWN male in NAD, answers questions appropriately  Abdomen: Soft, mild TTP, multiple surgical scars, obvious ventral hernia  Extremities: No edema in the BLEs    Pertinent Labs & Studies:  -I have reviewed the patient's labs from 12/28/23 which show stable Hgb and stable CRP    Cscope 11/30/23  Impression:            - Preparation of the colon was inadequate.                         - Stool in the entire examined colon.                         - Patent end-to-side ileo-colonic anastomosis,  characterized by only very mild narrowing.                         - Simple Endoscopic Score for Crohn's Disease: 7,                          mucosal inflammatory changes secondary to Crohn's                          disease with ileitis. Rutgeerts i3. No colonic                          involvement on today's exam.

## 2023-12-28 NOTE — Unmapped (Signed)
 Internal Medicine (MEDW) History & Physical    Assessment & Plan:   Jerome Corti. is a 48 y.o. male whose presentation is complicated by Crohn's disease s/p hemicolectomy, COPD, DVT (not on [facility]), bipolar disorder that presented to [facility] with abdominal pain, N/V, poor PO intolerance concerning for Crohn's flare.     Principal Problem:    Abdominal pain  Active Problems:    Crohn's disease      Bipolar 1 disorder      COPD (chronic obstructive pulmonary disease)      Hx of deep venous thrombosis        Active Problems    Ileitis - Concern for Crohn's flare   Patient presents with 24h of abdominal pain, nausea, vomiting and poor PO intolerance. Reports 1 episode of diarrhea in the last 24h. He denies hematochezia, hematemesis, or melena. No fevers or chills. CT A/P showing persistent ileitis. He has had multiple recent hospital presentations for Crohn's flare, most recently an ED visit on 5/18. CT A/P at that time showed inflammation in the neoterminal ileum, consistent with active enteritis. Patient was also hospitalized in April for Crohn's flare, during which he underwent colonoscopy that revealed ileitis 2/2 Crohn's flare. Patient received IV methylprednisolone  during this admission and was discharged on oral prednisone . He is currently in the midst of transition from Humira  to Rinvoq  with [facility]. Reports he saw the tablet in his stool on 5/24. Will admit for treatment of presumed Crohn's flare.   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 He is not on lifelong anti-coagulation on advice of Heme/Onc due to bleeding risk with his IBD (see note from 2022 [facility] hospitalization). He is at elevated risk for clot in the setting of Crohn's flare.   - DVT ppx as above     Bipolar disorder   - Continue home Vraylar  1.5 mg daily  - Continue home Seroquel  100 mg daily     The patient's presentation is complicated by the following clinically significant conditions requiring additional evaluation and treatment: - Hypercoagulable state requiring additional attention to DVT prophylaxis and treatment or chronic anticoagulation secondary to Inflammatory Bowel Disease     Checklist:  Diet: Regular Diet  DVT PPx: Lovenox  40mg  q24h  Code Status: Full Code  Dispo: Patient appropriate for Observation based on expectation at time of admission that period of observation will be less than 30 hours    Team Contact Information:   Primary Team: Internal Medicine (MEDW)  Primary Resident: Jewel Mortimer, MD  Resident's Pager: [phone] [facility])    Chief Concern:   Abdominal pain      Subjective:   Jerome Whitworth. is a 48 y.o. male whose presentation is complicated by Crohn's disease s/p hemicolectomy, COPD, DVT (not on [facility]), bipolar disorder that presented  to [facility] with abdominal pain, N/V, poor PO intolerance concerning for Crohn's flare.     History obtained by patient interview.     HPI:  Patient presents with 24h of abdominal pain, nausea, vomiting and poor PO intolerance. Reports 1 episode of diarrhea in the last 24h. He denies hematochezia, hematemesis, or melena. He is currently in the midst of transition from Humira  to Rinvoq  with [facility]. Reports he saw the tablet in his stool on 5/24.       Pertinent Surgical Hx  Hemicolectomy    Pertinent Family Hx  NA    Pertinent Social Hx   NA    Allergies  Morphine  and Toradol [ketorolac]    I reviewed the Medication List. The current list is Accurate  Prior to Admission medications    Medication Dose, Route, Frequency   cariprazine  (VRAYLAR ) 1.5 mg capsule 1.5 mg, Oral, Daily (standard)   cholecalciferol , vitamin D3 25 mcg, 1,000 units,, 1,000 unit (25 mcg) tablet 25 mcg, Oral, Daily (standard)   dicyclomine  (BENTYL ) 10 mg capsule 10 mg, Oral, 4 times a day PRN   empty container Misc Use as directed   ferrous sulfate  325 (65 FE) MG tablet 325 mg, Oral, Daily   FLONASE  ALLERGY RELIEF 50 mcg/actuation nasal spray 2 sprays, Each Nare, Daily PRN   OPTICHAMBER DIAMOND VHC Spcr    pantoprazole  (PROTONIX ) 40 MG tablet 40 mg, Oral, Daily (standard)   predniSONE  (DELTASONE ) 10 MG tablet Take 4 tablets (40 mg total) by mouth daily for 14 days, THEN 3 tablets (30 mg total) daily for 7 days, THEN 2 tablets (20 mg total) daily for 7 days, THEN 1?? tablets (15 mg total) daily for 7 days, THEN 1 tablet (10 mg total) daily for 7 days, THEN ?? tablets (5 mg total) daily for 7 days.   QUEtiapine  (SEROQUEL ) 100 MG tablet Take 1 tablet (100 mg total) by mouth nightly.   SYMBICORT  80-4.5 mcg/actuation inhaler 2 puffs, Inhalation, Daily (RT)   upadacitinib  (RINVOQ ) 30 mg tablet 30 mg, Oral, Daily (standard)   upadacitinib  (RINVOQ ) 45 mg Tb24 45 mg, Oral, Daily (standard)   VENTOLIN  HFA 90 mcg/actuation inhaler 2 puffs, Inhalation, Every 6 hours PRN       Librarian, academic:  Jerome Kennedy currently has decisional capacity for healthcare decision-making and is able to designate a surrogate healthcare decision maker. Jerome Kennedy designated healthcare decision maker(s) is/are [place]  Hosang (the patient's parent) as denoted by stated patient preference.    Objective:   Physical Exam:  Temp:  [37 ??C (98.6 ??F)] 37 ??C (98.6 ??F)  Pulse:  [78-83] 78  Resp:  [16-17] 16  BP: (101-105)/(66-70) 101/70  SpO2:  [96 %-97 %] 97 %    Gen: NAD, converses   Eyes: Sclera anicteric, EOMI grossly normal   HENT: Atraumatic, normocephalic  Neck: Trachea midline  Heart: RRR  Lungs: CTAB, no crackles or wheezes  Abdomen: soft, non-distended; no TTP; fairly large abdominal wall hernia   Extremities: No edema  Neuro: Grossly symmetric, non-focal    Skin:  No rashes, lesions on clothed exam  Psych: Alert, oriented

## 2023-12-28 NOTE — Unmapped (Addendum)
 Outpatient follow-up tasks:  [ ]  continue PJP ppx with bactrim  until prednisone  taper decreases below 20mg   [ ]  will follow up with GI to consider changing therapy from Rinvoq         Jerome Sabine Jr. is a 48 y.o. male whose presentation is complicated by Crohn's disease s/p hemicolectomy, COPD, DVT (not on [facility]), bipolar disorder that presented to [facility] with abdominal pain, N/V, poor PO intolerance concerning for Crohn's flare.     Ileitis - Crohn's disease - Concern for Crohn's flare   Presented with roughly 1 week of diarrhea followed by roughly 1 day severe abdominal pain, nausea, vomiting.  Presentation less consistent with true flare given normal ESR/CRP, likely represents inadequate treatment with Rinvoq , with concerns for poor absoprtion due to seeing pills in stool. Per GI, plans to transition to injectable therapy outpatient. Scope deferred as he had only recently been switched to Rinvoq , would not expect to see endoscopic changes yet. Per GI, restarted prednisone  taper at 40 mg, continue Rinvoq  until appointment, has outpatient appointment within 2 weeks of dc.        Chronic Problems  COPD: Given formulary equivalent of home Symbicort  throughout admission.  Resumed home Symbicort  upon discharge.  Hx DVT (not on [facility])  History of multiple DVTs since 2006, including RUE and neck veins.  Not on lifelong anticoagulation per advice of heme due to bleeding risk with IBD (see note from 2022 [facility] hospitalization).  Given DVT prophylaxis during admission.  Bipolar disorder: Continued home Vraylar  and Seroquel  throughout admission and upon discharge.

## 2023-12-28 NOTE — Unmapped (Signed)
 Patient rounding complete. Side rails up x2. Bed in low position and locked. Pt awake and alert at this time.

## 2023-12-29 LAB — BASIC METABOLIC PANEL
ANION GAP: 8 mmol/L (ref 5–14)
BLOOD UREA NITROGEN: 8 mg/dL — ABNORMAL LOW (ref 9–23)
BUN / CREAT RATIO: 8
CALCIUM: 8.5 mg/dL — ABNORMAL LOW (ref 8.7–10.4)
CHLORIDE: 111 mmol/L — ABNORMAL HIGH (ref 98–107)
CO2: 22 mmol/L (ref 20.0–31.0)
CREATININE: 0.99 mg/dL (ref 0.73–1.18)
EGFR CKD-EPI (2021) MALE: >90 mL/min/1.73m2 (ref >=60)
GLUCOSE RANDOM: 79 mg/dL (ref 70–179)
POTASSIUM: 3.5 mmol/L (ref 3.4–4.8)
SODIUM: 141 mmol/L (ref 135–145)

## 2023-12-29 LAB — CBC
HEMATOCRIT: 41.2 % (ref 39.0–48.0)
HEMOGLOBIN: 13.6 g/dL (ref 12.9–16.5)
MEAN CORPUSCULAR HEMOGLOBIN CONC: 33 g/dL (ref 32.0–36.0)
MEAN CORPUSCULAR HEMOGLOBIN: 27.4 pg (ref 25.9–32.4)
MEAN CORPUSCULAR VOLUME: 83.1 fL (ref 77.6–95.7)
MEAN PLATELET VOLUME: 8.8 fL (ref 6.8–10.7)
PLATELET COUNT: 98 10*9/L — ABNORMAL LOW (ref 150–450)
RED BLOOD CELL COUNT: 4.95 10*12/L (ref 4.26–5.60)
RED CELL DISTRIBUTION WIDTH: 17.1 % — ABNORMAL HIGH (ref 12.2–15.2)
WBC ADJUSTED: 5.2 10*9/L (ref 3.6–11.2)

## 2023-12-29 LAB — MAGNESIUM: MAGNESIUM: 1.8 mg/dL (ref 1.6–2.6)

## 2023-12-29 LAB — SEDIMENTATION RATE: ERYTHROCYTE SEDIMENTATION RATE: 2 mm/h (ref 0–15)

## 2023-12-29 LAB — C-REACTIVE PROTEIN: C-REACTIVE PROTEIN: <5 mg/L (ref <=10.0)

## 2023-12-29 MED ADMIN — HYDROmorphone (PF) (DILAUDID) injection 1 mg: 1 mg | INTRAVENOUS | @ 06:00:00 | Stop: 2024-01-10

## 2023-12-29 MED ADMIN — hydrOXYzine (ATARAX) tablet 50 mg: 50 mg | ORAL | @ 07:00:00 | Stop: 2023-12-29

## 2023-12-29 MED ADMIN — HYDROmorphone (PF) (DILAUDID) injection 1 mg: 1 mg | INTRAVENOUS | @ 21:00:00 | Stop: 2024-01-10

## 2023-12-29 MED ADMIN — HYDROmorphone (PF) (DILAUDID) injection 1 mg: 1 mg | INTRAVENOUS | @ 02:00:00 | Stop: 2024-01-10

## 2023-12-29 MED ADMIN — enoxaparin (LOVENOX) syringe 40 mg: 40 mg | SUBCUTANEOUS | @ 02:00:00

## 2023-12-29 MED ADMIN — QUEtiapine (SEROQUEL) tablet 100 mg: 100 mg | ORAL

## 2023-12-29 MED ADMIN — prochlorperazine (COMPAZINE) injection 5 mg: 5 mg | INTRAVENOUS

## 2023-12-29 MED ADMIN — prochlorperazine (COMPAZINE) injection 5 mg: 5 mg | INTRAVENOUS | @ 21:00:00

## 2023-12-29 MED ADMIN — predniSONE (DELTASONE) tablet 20 mg: 20 mg | ORAL | @ 12:00:00 | Stop: 2023-12-29

## 2023-12-29 MED ADMIN — hydrOXYzine (ATARAX) tablet 10 mg: 10 mg | ORAL | @ 18:00:00

## 2023-12-29 MED ADMIN — HYDROmorphone (PF) (DILAUDID) injection 1 mg: 1 mg | INTRAVENOUS | @ 17:00:00 | Stop: 2024-01-10

## 2023-12-29 MED ADMIN — acetaminophen (TYLENOL) tablet 1,000 mg: 1000 mg | ORAL | @ 18:00:00

## 2023-12-29 MED ADMIN — cariprazine (VRAYLAR) capsule 1.5 mg: 1.5 mg | ORAL | @ 12:00:00

## 2023-12-29 MED ADMIN — acetaminophen (TYLENOL) tablet 1,000 mg: 1000 mg | ORAL | @ 12:00:00

## 2023-12-29 MED ADMIN — nicotine (NICODERM CQ) 21 mg/24 hr patch 1 patch: 1 | TRANSDERMAL | @ 18:00:00

## 2023-12-29 MED ADMIN — pantoprazole (Protonix) EC tablet 40 mg: 40 mg | ORAL | @ 12:00:00

## 2023-12-29 MED ADMIN — HYDROmorphone (PF) (DILAUDID) injection 1 mg: 1 mg | INTRAVENOUS | @ 12:00:00 | Stop: 2024-01-10

## 2023-12-29 MED ADMIN — fluticasone furoate-vilanterol (BREO ELLIPTA) 100-25 mcg/dose inhaler 1 puff: 1 | RESPIRATORY_TRACT | @ 12:00:00

## 2023-12-29 MED ADMIN — prochlorperazine (COMPAZINE) injection 5 mg: 5 mg | INTRAVENOUS | @ 12:00:00

## 2023-12-29 MED ADMIN — ferrous sulfate tablet 325 mg: 325 mg | ORAL | @ 12:00:00

## 2023-12-29 MED ADMIN — cholecalciferol (vitamin D3 25 mcg (1,000 units)) tablet 25 mcg: 25 ug | ORAL | @ 12:00:00

## 2023-12-29 NOTE — Unmapped (Signed)
 Internal Medicine (MEDW) Progress Note    Assessment & Plan:   Jerome Sally. is a 48 y.o. male whose presentation is complicated by Crohn's disease s/p hemicolectomy, COPD, DVT (not on [facility]), bipolar disorder that presented to [facility] with abdominal pain, N/V, poor PO intolerance concerning for Crohn's flare.     Principal Problem:    Abdominal pain  Active Problems:    Crohn's disease      Bipolar 1 disorder      COPD (chronic obstructive pulmonary disease)      Hx of deep venous thrombosis    Active Problems    Ileitis - Concern for Crohn's flare   Continued abdominal pain, nausea and vomiting but still with only 1 loose stool in the last 24 hours with no hematochezia. Cf possible Crohn's flare, though without increased stool volume and negative ESR/CRP less likely. Per GI his sx may be presistent disease as he begins treatment with Rinvoq . No plans for scope as too soon to evaluate response to Rinvoq .   -GI consulted, appreciate assistance  -fu c diff, GIPP  -fu fecal calprotectin  -continue outpatient pred taper, currently on 20 mg  -consider PJP ppx if prednisone  taper needs to be extended  - Acetaminophen  650 q6h PRN for mild main  - Oxycodone  5 mg q4h PRN for moderate pain   - IV Dilaudid  1 mg q3h PRN for severe breakthrough pain         Chronic Problems  COPD   - Daily Breo-Ellipta while inpatient (on home Symbicort )     Hx DVT (not on [facility])  Patient has history of multiple DVTs since 2006, mainly in the RUE and neck veins. He is not on lifelong anti-coagulation on advice of Heme/Onc due to bleeding risk with his IBD (see note from 2022 [facility] hospitalization). He is at elevated risk for clot in the setting of Crohn's flare.   - DVT ppx as above      Bipolar disorder   - Continue home Vraylar  1.5 mg daily  - Continue home Seroquel  100 mg daily       The patient's presentation is complicated by the following clinically significant conditions requiring additional evaluation and treatment: - Hypercoagulable state requiring additional attention to DVT prophylaxis and treatment or chronic anticoagulation secondary to Inflammatory Bowel Disease    Issues Impacting Complexity of Management:  -Parenteral controlled medications: IV Dilaudid       Medical Decision Making: Reviewed records from the following unique sources  consult notes, prior admission notes and dc summary.      Daily Checklist:  Diet: Regular Diet  DVT PPx: Lovenox  40mg  q24h  Electrolytes: Replete Potassium to >/= 3.6 and Magnesium  to >/= 1.8  Code Status: Full Code  Dispo: Transfer to Floor    Team Contact Information:   Primary Team: Internal Medicine (MEDW)  Primary Resident: Mel Spine, MD, MD  Resident's Pager: [phone] (Gen MedW Intern - Tower)    Interval History:   One episode of emesis overnight, still having lower abdominal pain but only one loose stool in the last day, no hematochezia. States he will try to have someone bring in home Rinvoq  to continue initiation.     All other systems were reviewed and are negative except as noted in the HPI    Objective:   Temp:  [36.3 ??C (97.3 ??F)-36.7 ??C (98.1 ??F)] 36.3 ??C (97.3 ??F)  Pulse:  [60-77] 77  Resp:  [18] 18  BP: (107-136)/(78-82) 136/82  SpO2:  [  97 %-99 %] 98 %    Gen: uncomfortable appearing, converses   HENT: atraumatic, normocephalic  Heart: RRR  Lungs: CTAB, no crackles or wheezes  Abdomen: soft,diffuse tenderness to palpation, worst in RLQ  Extremities: No edema    Mel Spine MD  Internal Medicine PGY1

## 2023-12-29 NOTE — Unmapped (Signed)
 [facility]   Progress Note         Assessment and Recommendations:   Jerome Schlageter. is a 48 y.o. 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 The patient is seen in consultation at the request of Eve Hinders, MD ([facility] ([facility])) for Crohn's disease.    C/f CD flare vs persistent ileitis  Pt w/ long history of CD and multiple hospitalizations for abdominal pain and increased mucus in his stool. He reports after last admission he briefly felt improved while on oral steroids. Prednisone  tapered from 30mg  to 20mg  after which he started to develop symptoms again. This admission with similar symptoms and findings on CT. Unclear if true flare vs persistent disease iso rinvoq  induction. Also unclear if patient is truly absorbing rinvoq  as he has h/o SGS and endorses seeing pill in stool. Reassured that labs are stable. We discussed plan to switch therapy from rinvoq  to IV/subcutaneous therapy.   - continue rinvoq  while inpatient; START imodium  2 tabs imodium  1 hr before  - increase prednisone  -- 40mg  x 2 weeks, 30mg  x 1 week, 20mg  x 1 week, 15mg  x 1 week, 10mg  x 1 week, 5mg  x 1 week  - outpatient start risankizumab (IV induction followed by subcutaneous dosing)   - follow up stool studies, including calprotectin   - trend CBC, CMP, CRP daily  - DVT ppx    Issues Impacting Complexity of Management:  -None    Recommendations discussed with the patient's primary team. We will continue to follow along with you.    Subjective:     He confirms that he has had ongoing mucus in his stool. He has also seen what looks like pills in his stool.     -I have reviewed the patient's prior records from this admission and previous office visits as summarized in the HPI    Objective:   Temp:  [36.3 ??C (97.3 ??F)-36.7 ??C (98.1 ??F)] 36.3 ??C (97.3 ??F)  Pulse:  [60-77] 77  Resp:  [18] 18  BP: (103-136)/(65-82) 136/82  SpO2:  [97 %-99 %] 98 %    Gen: WDWN male in NAD, answers questions appropriately  Abdomen: Soft, mild TTP, multiple surgical scars, obvious ventral hernia  Extremities: No edema in the BLEs    Pertinent Labs & Studies:  -I have reviewed the patient's labs from 12/29/23 which show stable Hgb, stable renal function (SCr, electrolytes), and stable CRP    Cscope 11/30/23  Impression:            - Preparation of the colon was inadequate.                         - Stool in the entire examined colon.                         - Patent end-to-side ileo-colonic anastomosis,                          characterized by only very mild narrowing.                         -  Simple Endoscopic Score for Crohn's Disease: 7,                          mucosal inflammatory changes secondary to Crohn's                          disease with ileitis. Rutgeerts i3. No colonic                          involvement on today's exam.

## 2023-12-29 NOTE — Unmapped (Signed)
 Care Management  Initial Transition Planning Assessment    Patient lives alone in [place] [facility]) in a 1 level home with 10 steps to enter.  At baseline patient is independent with ADLs. He was not receives home health services. Patient does not have DME. Patient stated he was going to take a taxi.                 General  Care Manager / Social Worker assessed the patient by : In person interview with patient, Medical record review, Discussion with Clinical Care team  Orientation Level: Oriented X4  Functional level prior to admission: Independent  Reason for referral: Discharge Planning    Contact/Decision Maker  Extended Emergency Contact Information  Primary Emergency Contact: Parker,Latoya  Mobile Phone: [phone]  Relation: Friend  Secondary Emergency Contact: Scannell,Bryant  Home Phone: [phone]  Mobile Phone: [phone]  Relation: Brother  Preferred language: ENGLISH  Interpreter needed? No  Mother: Abdoulaye, Drum Phone: [phone]    Legal Next of Kin / Guardian / POA / Advance Directives     HCDM (patient stated preference): Shal[place], Sanfilippo - Mother - [phone]    Advance Directive (Medical Treatment)  Does patient have an advance directive covering medical treatment?: Patient does not have advance directive covering medical treatment.    Health Care Decision Maker [HCDM] (Medical & Mental Health Treatment)  Healthcare Decision Maker: Patient does not wish to appoint a Health Care Decision Maker at this time  Information offered on HCDM, Medical & Mental Health advance directives:: Patient declined information.         Readmission Information    Have you been hospitalized in the last 30 days?: Yes  Name of Hospital: [facility]  Were you being cared for at a skilled nursing facility:: No     What day were you discharged from that hospital or facility?: 11/25/23  Number of Days between previous discharge and readmission date: 15-30 days    Type of Readmission: Related to Previous Admission    Readmission Source: Home       Did the following happen with your discharge?       Did you understand your discharge instructions?: Yes            Did you feel prepared for discharge?: Yes        Patient Information  Lives with: Alone    Type of Residence: Private residence        Location/Detail: [address] [place] [address]    Support Systems/Concerns: Family Members    Responsibilities/Dependents at home?: No    Home Care services in place prior to admission?: No                  Equipment Currently Used at Home: none       Currently receiving outpatient dialysis?: No       Financial Information       Need for financial assistance?: No       Social Determinants of Health  Social Drivers of Health     Food Insecurity: No Food Insecurity (12/29/2023)    Hunger Vital Sign     Worried About Running Out of Food in the Last Year: Never true     Ran Out of Food in the Last Year: Never true   Tobacco Use: High Risk (12/29/2023)    Patient History     Smoking Tobacco Use: Every Day     Smokeless Tobacco  Use: Never     Passive Exposure: Current   Transportation Needs: No Transportation Needs (12/29/2023)    PRAPARE - Therapist, art (Medical): No     Lack of Transportation (Non-Medical): No   Alcohol Use: Not on file   Housing: Low Risk  (12/29/2023)    Housing     Within the past 12 months, have you ever stayed: outside, in a car, in a tent, in an overnight shelter, or temporarily in someone else's home (i.e. couch-surfing)?: No     Are you worried about losing your housing?: No   Physical Activity: Not on File (06/21/2021)    Received from [place], [place]    Physical Activity     Physical Activity: 0   Utilities: Low Risk  (12/29/2023)    Utilities     Within the past 12 months, have you been unable to get utilities (heat, electricity) when it was really needed?: No   Stress: Not on File (06/21/2021)    Received from [facility], [place]    Stress     Stress: 0 Interpersonal Safety: Not At Risk (12/27/2023)    Interpersonal Safety     Unsafe Where You Currently Live: No     Physically Hurt by Anyone: No     Abused by Anyone: No   Substance Use: Not on file (06/10/2023)   Intimate Partner Violence: Not At Risk (12/27/2023)    Humiliation, Afraid, Rape, and Kick questionnaire     Fear of Current or Ex-Partner: No     Emotionally Abused: No     Physically Abused: No     Sexually Abused: No   Social Connections: Not on File (04/18/2023)    Received from [employer] [facility]    Social Connections     Connectedness: 0   Financial Resource Strain: Low Risk  (12/29/2023)    Overall Financial Resource Strain (CARDIA)     Difficulty of Paying Living Expenses: Not hard at all   Health Literacy: Not on file   Internet Connectivity: Not on file       Complex Discharge Information    Is patient identified as a difficult/complex discharge?: No        Interventions:       Discharge Needs Assessment  Concerns to be Addressed:      Clinical Risk Factors:         Discharge Facility/Level of Care Needs:      Readmission  Risk of Unplanned Readmission Score: UNPLANNED READMISSION SCORE: 36.81%  Predictive Model Details          37% (High)  Factor Value    Calculated 12/29/2023 12:05 37% Number of ED visits in last six months 10    [facility] Risk of Unplanned Readmission Model 11% Number of active inpatient medication orders 23     10% Number of hospitalizations in last year 3     6% Active antipsychotic inpatient medication order present     6% ECG/EKG order present in last 6 months     6% Latest calcium  low (8.5 mg/dL)     5% Diagnosis of electrolyte disorder present     4% Imaging order present in last 6 months     3% Diagnosis of deficiency anemia present     3% Active anticoagulant inpatient medication order present     3% Active corticosteroid inpatient medication order present     3% Age 48     2% Charlson Comorbidity Index 2  1% Future appointment scheduled     1% Active ulcer inpatient medication order present     1% Current length of stay 0.795 days      Readmitted Within the Last 30 Days? (No if blank) Yes       Discharge Plan       Expected Discharge Date: 12/30/2023    Expected Transfer from Critical Care:                 Initial Assessment complete?: Yes

## 2023-12-29 NOTE — Unmapped (Signed)
 Patient admitted with abdominal pain secondary to crohn's flare. Pain managed with PRN Dilaudid  with relief. Patient with complaint of pruritus and managed with a PRN dose of Atarax  with relief. Falls/ Enteric precautions maintained. Waiting on patient to collect stool sample. Falls precautions maintained. VSS. Afebrile. O2 sats stable on room air. Continue to monitor and notify the Provider of any changes in patient condition.       Problem: Adult Inpatient Plan of Care  Goal: Plan of Care Review  12/29/2023 0732 by Ansel Kingdom, RN  Outcome: Shift Focus     Problem: Adult Inpatient Plan of Care  Goal: Patient-Specific Goal (Individualized)  12/29/2023 0732 by Ansel Kingdom, RN  Outcome: Shift Focus     Problem: Adult Inpatient Plan of Care  Goal: Absence of Hospital-Acquired Illness or Injury  12/29/2023 0732 by Ansel Kingdom, RN  Outcome: Shift Focus     Problem: Adult Inpatient Plan of Care  Goal: Absence of Hospital-Acquired Illness or Injury  Intervention: Identify and Manage Fall Risk  Recent Flowsheet Documentation  Taken 12/28/2023 2115 by Ansel Kingdom, RN  Safety Interventions:   commode/urinal/bedpan at bedside   fall reduction program maintained   isolation precautions   lighting adjusted for tasks/safety   low bed   nonskid shoes/slippers when out of bed   mobility aid     Problem: Adult Inpatient Plan of Care  Goal: Absence of Hospital-Acquired Illness or Injury  Intervention: Prevent and Manage VTE (Venous Thromboembolism) Risk  Recent Flowsheet Documentation  Taken 12/28/2023 2115 by Ansel Kingdom, RN  Anti-Embolism Device Status: (VTE: Lovenox ) Other (Comment)  Taken 12/28/2023 2027 by Ansel Kingdom, RN  Anti-Embolism Device Status: (VTE: Lovenox ) Other (Comment)     Problem: Adult Inpatient Plan of Care  Goal: Absence of Hospital-Acquired Illness or Injury  Intervention: Prevent Infection  Recent Flowsheet Documentation  Taken 12/28/2023 2115 by Ansel Kingdom, RN  Infection Prevention: cohorting utilized   environmental surveillance performed   hand hygiene promoted   rest/sleep promoted     Problem: Adult Inpatient Plan of Care  Goal: Optimal Comfort and Wellbeing  12/29/2023 0732 by Ansel Kingdom, RN  Outcome: Shift Focus  12/29/2023 0731 by Ansel Kingdom, RN  Outcome: Shift Focus     Problem: Infection  Goal: Absence of Infection Signs and Symptoms  Intervention: Prevent or Manage Infection  Recent Flowsheet Documentation  Taken 12/28/2023 2115 by Ansel Kingdom, RN  Isolation Precautions: enteric precautions maintained     Problem: Fall Injury Risk  Goal: Absence of Fall and Fall-Related Injury  12/29/2023 0732 by Ansel Kingdom, RN  Outcome: Shift Focus  12/29/2023 0731 by Ansel Kingdom, RN  Outcome: Shift Focus  Intervention: Promote Injury-Free Environment  Recent Flowsheet Documentation  Taken 12/28/2023 2115 by Ansel Kingdom, RN  Safety Interventions:   commode/urinal/bedpan at bedside   fall reduction program maintained   isolation precautions   lighting adjusted for tasks/safety   low bed   nonskid shoes/slippers when out of bed   mobility aid     Problem: Pain Acute  Goal: Optimal Pain Control and Function  12/29/2023 0732 by Ansel Kingdom, RN  Outcome: Shift Focus     Problem: Comorbidity Management  Goal: Maintenance of COPD Symptom Control  12/29/2023 0732 by Ansel Kingdom, RN  Outcome: Shift Focus     Problem: Self-Care Deficit  Goal: Improved Ability to Complete Activities of Daily Living  12/29/2023 0732 by Ansel Kingdom, RN  Outcome: Shift Focus  Shift Summary   Pain management was a focus, with HYDROmorphone  administered twice for breakthrough pain, leading to temporary relief.    Skin integrity showed cracking, but no new hospital-acquired injuries or infections were noted.    Fall risk interventions were consistently applied, preventing any falls or injuries.    Comfort interventions included administration of hydrOXYzine  for itching.    Overall, the patient maintained stable condition with effective management of pain and fall risk.     Absence of Hospital-Acquired Illness or Injury: Skin integrity showed cracking at the beginning of the shift, but no new hospital-acquired injuries or infections were noted, and infection prevention measures were maintained throughout the shift.     Optimal Comfort and Wellbeing: Comfort interventions included administration of hydrOXYzine  for itching, which was given at 3:23 AM.     Absence of Fall and Fall-Related Injury: Fall risk interventions were consistently applied, including hourly visual checks and toileting every two hours, with no falls or injuries reported during the shift.     Optimal Pain Control and Function: Pain in the abdomen was constant and acute, with fluctuations in intensity; HYDROmorphone  was administered for breakthrough pain, resulting in temporary relief.     Improved Ability to Complete Activities of Daily Living: Assistance was needed for bathing, but the patient was able to turn self in bed and was modified independent with a cane for mobility.

## 2023-12-29 NOTE — Unmapped (Signed)
 Patient is alert and oriented x4.  Meds given per MAR.  Enteric precautions maintained.  No falls.  Pt calls appropriately.  Will continue POC.  Estimated discharge date unknown.    Problem: Adult Inpatient Plan of Care  Goal: Plan of Care Review  Outcome: Shift Focus     Problem: Adult Inpatient Plan of Care  Goal: Patient-Specific Goal (Individualized)  Outcome: Shift Focus

## 2023-12-29 NOTE — Unmapped (Signed)
 Pt alert and oriented x 4. Complains of pain and nausea, see MAR. Continent of bowel and bladder. Ambulating in room. Will continue with current plan of care.  Problem: Adult Inpatient Plan of Care  Goal: Absence of Hospital-Acquired Illness or Injury  Intervention: Identify and Manage Fall Risk  Recent Flowsheet Documentation  Taken 12/29/2023 1600 by Elmyra Haggard, RN  Safety Interventions:   low bed   lighting adjusted for tasks/safety  Intervention: Prevent Skin Injury  Recent Flowsheet Documentation  Taken 12/29/2023 1600 by Elmyra Haggard, RN  Positioning for Skin: Right  Taken 12/29/2023 1530 by Elmyra Haggard, RN  Positioning for Skin: Right  Skin Protection: tubing/devices free from skin contact  Intervention: Prevent Infection  Recent Flowsheet Documentation  Taken 12/29/2023 1600 by Elmyra Haggard, RN  Infection Prevention: cohorting utilized     Problem: Infection  Goal: Absence of Infection Signs and Symptoms  Intervention: Prevent or Manage Infection  Recent Flowsheet Documentation  Taken 12/29/2023 1600 by Elmyra Haggard, RN  Isolation Precautions: enteric precautions maintained     Problem: Fall Injury Risk  Goal: Absence of Fall and Fall-Related Injury  Intervention: Promote Injury-Free Environment  Recent Flowsheet Documentation  Taken 12/29/2023 1600 by Elmyra Haggard, RN  Safety Interventions:   low bed   lighting adjusted for tasks/safety     Problem: Pain Acute  Goal: Optimal Pain Control and Function  Intervention: Prevent or Manage Pain  Recent Flowsheet Documentation  Taken 12/29/2023 1600 by Elmyra Haggard, RN  Sleep/Rest Enhancement: awakenings minimized

## 2023-12-29 NOTE — Unmapped (Signed)
 Inpatient Tobacco Cessation Counseling Note    This medical encounter was conducted virtually using [email or web address]  TeleHealth protocols.    I have identified myself to the patient and conveyed my credentials to Mr. Vanvoorhis  I have explained the capabilities and limitations of telemedicine and the patient/proxy and myself both agree that it is appropriate for their current circumstances/symptoms.     Contact Information  Person Contacted: Muhammad Stickler Jr.         Contact Phone number: [phone]      Phone Outcome: Spoke with pt  Is there someone else in the room? No.     Patient's location at the time of the telephone visit: Hospitalized at [facility]   Provider's location at the time of the telephone visit: At home, in [place]       Purpose of contact:     Pt participated in a telephone visit for tobacco cessation counseling.  Patient was admitted to hospital for Crohn's disease of ileum with complication  [K50.019]  Abdominal pain, unspecified abdominal location [R10.9]  Unable to eat [R63.8]  Nausea and vomiting, unspecified vomiting type [R11.2]. Patient consented to telephone visit given due to social isolation measures in place due to the COVID-19 pandemic.     Tobacco Use History and Assessment  Time Since Last Tobacco Use: 1 to 7 days ago  Tobacco Withdrawal (Past 24 Hours): Desire or craving to smoke  Type of Tobacco Products Used: Cigarettes  Quantity Used: 20  Quantity Per: day  Relight Cigarettes: Yes  Time to First Use After Waking: Immediately  Age Began Use (Years Old): 15  Brand of Tobacco Used: [place]  Menthol: Yes  Medications Used in Past Attempts: Nicotine  Patch, Nicotine  Gum, Nicotine  Lozenge  Previously seen by NDP?: Hospital Inpatient    Behavioral Assessment  Why Uses: 1. habit 2. stress-relief 3. smokes after meals  Reasons to Become Tobacco Free: health  Barriers/Challenges: 1. long-standing habit 2. cravings 3. stress  Strategies: 1. pt can use NRT to manage cravings 2. pt can engage in other stress-reducing activities he enjoys 3. pt can use hand to mouth replacements    NOTE: Pt reports smoking 1ppd, noting he had reduced his use to 5cpd last year but that it has gradually increased due to stress. He desires to quit and is motivated to make this change for his health. Pt is experiencing significant cravings and is interested in using NRT. SW encouraged pt to use this hospitalization as an opportunity for cessation and to use NRT for 12 weeks. We reviewed strategies to assist in managing urges and triggers. SW provided education on how cessation aids in Crohn's management as well as COPD management. SW provided pt with contact information, physical improvements related to tobacco cessation, and available resources (including outpt Tobacco Treatment Program at [facility] and [facility]).    Treatment Plan  Please see below for medication recommendations in bold.   Cessation Meds Currently Using: None  Medications Recommended During Hospitalization: Patch 21mg , Lozenge 4mg , Gum 4mg   Outpatient/Discharge Medications Recommended: Patch 21mg , Lozenge 4mg , Gum 4mg   Plan to Obtain Outpatient Meds: TTS messaged providers for Rx  Patient's Plan Post Discharge/Visit: Plan to quit as soon as possible       As part of this Telephone Visit, no in-person exam was conducted.     I personally spent 12 minutes counseling the patient via telephone about tobacco cessation.  I spent an additional 11 minutes on pre- and post-visit activities.  The patient was physically located in [place]  or a state in which I am permitted to provide care. The patient and/or parent/guardian understood that s/he may incur co-pays and cost sharing, and agreed to the telemedicine visit. The visit was reasonable and appropriate under the circumstances given the patient's presentation at the time.     The patient and/or parent/guardian has been advised of the potential risks and limitations of this mode of treatment (including, but not limited to, the absence of in-person examination) and has agreed to be treated using telemedicine. The patient's/patient's family's questions regarding telemedicine have been answered.      If the visit was completed in an ambulatory setting, the patient and/or parent/guardian has also been advised to contact their provider???s office for worsening conditions, and seek emergency medical treatment and/or call 911 if the patient deems either necessary.     Visit Format/Coding: Telephone     Coding: 16109 (11-20 minutes)  Service rendered over the phone most consistent with: Tobacco cessation counseling, greater than 10 minutes (60454)     Aron Lard, LCSW, LCAS, NCTTP  Clinical Social Worker / Tobacco Treatment Specialist  Tobacco Treatment Program  [facility]  [phone]

## 2023-12-30 DIAGNOSIS — K50018 Crohn's disease of small intestine with other complication: Principal | ICD-10-CM

## 2023-12-30 LAB — SEDIMENTATION RATE: ERYTHROCYTE SEDIMENTATION RATE: 3 mm/h (ref 0–15)

## 2023-12-30 LAB — BASIC METABOLIC PANEL
ANION GAP: 12 mmol/L (ref 5–14)
BLOOD UREA NITROGEN: 9 mg/dL (ref 9–23)
BUN / CREAT RATIO: 9
CALCIUM: 8.9 mg/dL (ref 8.7–10.4)
CHLORIDE: 108 mmol/L — ABNORMAL HIGH (ref 98–107)
CO2: 24 mmol/L (ref 20.0–31.0)
CREATININE: 0.95 mg/dL (ref 0.73–1.18)
EGFR CKD-EPI (2021) MALE: >90 mL/min/1.73m2 (ref >=60)
GLUCOSE RANDOM: 82 mg/dL (ref 70–179)
POTASSIUM: 3.8 mmol/L (ref 3.4–4.8)
SODIUM: 144 mmol/L (ref 135–145)

## 2023-12-30 LAB — CBC
HEMATOCRIT: 43.3 % (ref 39.0–48.0)
HEMOGLOBIN: 14.4 g/dL (ref 12.9–16.5)
MEAN CORPUSCULAR HEMOGLOBIN CONC: 33.3 g/dL (ref 32.0–36.0)
MEAN CORPUSCULAR HEMOGLOBIN: 28 pg (ref 25.9–32.4)
MEAN CORPUSCULAR VOLUME: 84.1 fL (ref 77.6–95.7)
MEAN PLATELET VOLUME: 8.3 fL (ref 6.8–10.7)
PLATELET COUNT: 141 10*9/L — ABNORMAL LOW (ref 150–450)
RED BLOOD CELL COUNT: 5.15 10*12/L (ref 4.26–5.60)
RED CELL DISTRIBUTION WIDTH: 17 % — ABNORMAL HIGH (ref 12.2–15.2)
WBC ADJUSTED: 6.2 10*9/L (ref 3.6–11.2)

## 2023-12-30 LAB — C-REACTIVE PROTEIN: C-REACTIVE PROTEIN: <5 mg/L (ref <=10.0)

## 2023-12-30 LAB — MAGNESIUM: MAGNESIUM: 1.8 mg/dL (ref 1.6–2.6)

## 2023-12-30 MED ADMIN — QUEtiapine (SEROQUEL) tablet 100 mg: 100 mg | ORAL

## 2023-12-30 MED ADMIN — HYDROmorphone (PF) (DILAUDID) injection 1 mg: 1 mg | INTRAVENOUS | @ 09:00:00 | Stop: 2023-12-30

## 2023-12-30 MED ADMIN — HYDROmorphone (PF) (DILAUDID) injection 1 mg: 1 mg | INTRAVENOUS | Stop: 2024-01-10

## 2023-12-30 MED ADMIN — acetaminophen (TYLENOL) tablet 1,000 mg: 1000 mg | ORAL | @ 13:00:00

## 2023-12-30 MED ADMIN — HYDROmorphone (PF) (DILAUDID) injection 1 mg: 1 mg | INTRAVENOUS | @ 05:00:00 | Stop: 2023-12-30

## 2023-12-30 MED ADMIN — nicotine (NICODERM CQ) 21 mg/24 hr patch 1 patch: 1 | TRANSDERMAL | @ 13:00:00

## 2023-12-30 MED ADMIN — predniSONE (DELTASONE) tablet 40 mg: 40 mg | ORAL | @ 13:00:00 | Stop: 2024-01-13

## 2023-12-30 MED ADMIN — sulfamethoxazole-trimethoprim (BACTRIM DS) 800-160 mg tablet 160 mg of trimethoprim: 1 | ORAL | @ 13:00:00 | Stop: 2024-01-29

## 2023-12-30 MED ADMIN — oxyCODONE (ROXICODONE) immediate release tablet 5 mg: 5 mg | ORAL | @ 13:00:00 | Stop: 2024-01-10

## 2023-12-30 MED ADMIN — fluticasone furoate-vilanterol (BREO ELLIPTA) 100-25 mcg/dose inhaler 1 puff: 1 | RESPIRATORY_TRACT | @ 12:00:00

## 2023-12-30 MED ADMIN — nalbuphine (NUBAIN) injection 2.5 mg: 2.5 mg | INTRAVENOUS | @ 04:00:00 | Stop: 2023-12-29

## 2023-12-30 MED ADMIN — upadacitinib Tb24 45 mg: 45 mg | ORAL | @ 13:00:00

## 2023-12-30 MED ADMIN — hydrOXYzine (ATARAX) tablet 10 mg: 10 mg | ORAL | @ 13:00:00

## 2023-12-30 MED ADMIN — pantoprazole (Protonix) EC tablet 40 mg: 40 mg | ORAL | @ 13:00:00

## 2023-12-30 MED ADMIN — ondansetron (ZOFRAN-ODT) disintegrating tablet 4 mg: 4 mg | ORAL | @ 19:00:00

## 2023-12-30 MED ADMIN — ferrous sulfate tablet 325 mg: 325 mg | ORAL | @ 11:00:00

## 2023-12-30 MED ADMIN — oxyCODONE (ROXICODONE) immediate release tablet 5 mg: 5 mg | ORAL | @ 19:00:00 | Stop: 2024-01-10

## 2023-12-30 MED ADMIN — enoxaparin (LOVENOX) syringe 40 mg: 40 mg | SUBCUTANEOUS | @ 03:00:00

## 2023-12-30 MED ADMIN — prochlorperazine (COMPAZINE) injection 5 mg: 5 mg | INTRAVENOUS | @ 09:00:00

## 2023-12-30 MED ADMIN — cariprazine (VRAYLAR) capsule 1.5 mg: 1.5 mg | ORAL | @ 13:00:00

## 2023-12-30 MED ADMIN — cholecalciferol (vitamin D3 25 mcg (1,000 units)) tablet 25 mcg: 25 ug | ORAL | @ 13:00:00

## 2023-12-30 MED ADMIN — loperamide (IMODIUM) capsule 2 mg: 2 mg | ORAL | @ 11:00:00

## 2023-12-30 MED ADMIN — hydrOXYzine (ATARAX) tablet 10 mg: 10 mg | ORAL

## 2023-12-30 NOTE — Unmapped (Signed)
 Addended by: Shelvy Dickens on: 12/30/2023 01:25 PM     Modules accepted: Orders

## 2023-12-30 NOTE — Unmapped (Signed)
 THERAPY PLAN ORDER ENTRY DOCUMENTATION    The following order(s) have been placed for insurance authorization:    Therapy Plan Order  Medication: Risankizumab  Dose & Frequency: Crohns dosing, loading and maintenance  Diagnosis: Crohn's Disease  Treatment Center: [facility]      Lab Results   Component Value Date    HEPBCAB Nonreactive 12/02/2023    HBSAG Nonreactive 12/02/2023       Requesting Physician: Dr. Daria Eddy    Notes:      Spence Dux, RN  [phone] office

## 2023-12-30 NOTE — Unmapped (Signed)
 VENOUS ACCESS ULTRASOUND PROCEDURE NOTE    Indications:   Poor venous access.    The Venous Access Team has assessed this patient for the placement of a PIV. Ultrasound guidance was necessary to obtain access.     Procedure Details:  Identity of the patient was confirmed via name, medical record number and date of birth. The availability of the correct equipment was verified.    The vein was identified for ultrasound catheter insertion.  Field was prepared with necessary supplies and equipment.  Probe cover and sterile gel utilized.  Insertion site was prepped with chlorhexidine solution and allowed to dry.  The catheter extension was primed with normal saline. A(n) 22 gauge 1.75 catheter was placed in the R Forearm with 1attempt(s). See LDA for additional details.    Catheter aspirated, 4 mL blood return present. The catheter was then flushed with 10 mL of normal saline. Insertion site cleansed, and dressing applied per manufacturer guidelines. The catheter was inserted with difficulty due to poor vasculature by Valley Gavia, RN.    Primary RN was notified.     Thank you,     Valley Gavia, RN Venous Access Team   [phone]     Workup / Procedure Time:  30 minutes    See vein image in PACs.

## 2023-12-30 NOTE — Unmapped (Signed)
 Internal Medicine (MEDW) Progress Note    Assessment & Plan:   Jerome Rotert. is a 48 y.o. male whose presentation is complicated by Crohn's disease s/p hemicolectomy, COPD, DVT (not on Lifescape), bipolar disorder that presented to [facility] with abdominal pain, N/V, poor PO intolerance concerning for Crohn's flare.     Principal Problem:    Abdominal pain  Active Problems:    Crohn's disease      Bipolar 1 disorder      COPD (chronic obstructive pulmonary disease)      Hx of deep venous thrombosis    Tobacco use disorder    Active Problems  Ileitis - Cf failure of Crohns treatment  Improvement in abdominal pain and n/v. With nl ESR/CRP, presentation is likely due to inadequate response to Rinvoq . Per GI, will transition to injectable therapy as outpatient given concerns for poor absorption, however will continue Rinvoq  until then. Also recommended to restart prednisone  taper. Starting PJP ppx given prolonged high dose steroids.   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 He is not on lifelong anti-coagulation on advice of Heme/Onc due to bleeding risk with his IBD (see note from 2022 [facility] hospitalization). He is at elevated risk for clot in the setting of Crohn's flare.   - DVT ppx as above      Bipolar disorder   - Continue home Vraylar  1.5 mg daily  - Continue home Seroquel  100 mg daily     The patient's presentation is complicated by the following clinically significant conditions requiring additional evaluation and treatment: - Hypercoagulable state requiring additional attention to DVT prophylaxis and treatment or chronic anticoagulation secondary to Inflammatory Bowel Disease    Issues Impacting Complexity of Management:  -Parenteral controlled medications: IV Dilaudid       Medical Decision Making: Reviewed records from the following unique sources  consult notes, prior admission notes and dc summary.      Daily Checklist:  Diet: Regular Diet  DVT PPx: Lovenox  40mg  q24h  Electrolytes: Replete Potassium to >/= 3.6 and Magnesium  to >/= 1.8  Code Status: Full Code  Dispo: Transfer to Floor    Team Contact Information:   Primary Team: Internal Medicine (MEDW)  Primary Resident: Mel Spine, MD, MD  Resident's Pager: [phone] (Gen MedW Intern - Tower)    Interval History:   Improvement abdominal pain, nausea, vomiting.  Tolerated p.o. this morning without any nausea or pain.  Still only having on average 1 bowel movement daily.    All other systems were reviewed and are negative except as noted in the HPI    Objective:   Temp:  [36.5 ??C (97.7 ??F)-36.9 ??C (98.4 ??F)] 36.5 ??C (97.7 ??F)  Pulse:  [78-79] 78  Resp:  [16-18] 16  BP: (104-124)/(67-84) 124/84  SpO2:  [93 %-99 %] 93 %    Gen: Comfortable appearing, converses   HENT: atraumatic,  normocephalic  Heart: RRR  Lungs: CTAB, no crackles or wheezes  Abdomen: Nontender to palpation  Extremities: No edema    Mel Spine MD  Internal Medicine PGY1

## 2023-12-30 NOTE — Unmapped (Addendum)
 Pt in for abd pain; received prn oxy for c/o pain, atarax  for c/o itching. Enteric iso maintained. Stool sample collected and sent. Aox4, ra, independent.     Shift Summary   Pain was managed with prn oxyCODONE , leading to a gradual improvement in comfort.    Enteric precautions were maintained throughout the shift, with no signs of infection.    The patient remained safe with no falls, as confirmed by hourly visual checks and side rail use.    Independence in ambulation and transfers was noted, although some assistance was needed for ADLs.    Overall, the patient showed improvement in pain management and maintained safety without signs of infection.     Optimal Comfort and Wellbeing: Abdominal pain was constant and ongoing, but gradually improved after administration of prn oxyCODONE  and other interventions, with pain levels decreasing from 6 to denial of pain later in the shift.     Absence of Infection Signs and Symptoms: Enteric precautions were consistently maintained throughout the shift, and there were no signs of infection noted.     Absence of Fall and Fall-Related Injury: Hourly visual checks confirmed the patient remained awake and in bed, with side rails consistently at 3/4 for safety.     Optimal Pain Control and Function: Pain was initially reported at a level of 6 but improved to denial of pain after administration of oxyCODONE  and other interventions.     Improved Ability to Complete Activities of Daily Living: Independence was encouraged, and the patient was independent with ambulation and transfers, although assistance was needed for some ADLs.

## 2023-12-30 NOTE — Unmapped (Signed)
 OCCUPATIONAL THERAPY  Evaluation (12/30/23 0815)    Patient Name:  Jerome Kennedy.       Medical Record Number: [id number]     Date of Birth: [birth year]  Sex: Male      Post-Discharge Occupational Therapy Recommendations: Skilled OT services NOT indicated     Equipment Recommendation  OT DME Recommendations: None       Assessment    Clinical Decision Making: Low Complexity    Assessment: Jerome Jaquith. is a 49 y.o. male whose presentation is complicated by Crohn's disease s/p hemicolectomy, COPD, DVT (not on [facility]), bipolar disorder that presented to [facility] with abdominal pain, N/V, poor PO intolerance concerning for Crohn's flare.     Patient seen for initial OT evaluation and occupational profile. With consideration of patient's occupational profile, assessment review, level of clinical decision making involved, and intervention plan, patient presents as a low complexity case. At baseline patient reported being Mod I with ADLs and functional mobility skills using the [facility]. Intermittently tasks are challenging if his legs are fatigue or painful (primarily IADLs) however still able to manage with increased time. During session patient reported feeling better overall. He is ad lib in room and managing tasks with Mod I. Patient with no acute skilled OT needs at this time. Re-emphasis safety precautions with mobility skills as well as the importance of a HEP to maintain strength and stamina. No post acute or DME recommendations.    Today's Interventions: Other  Today's Interventions: Skills assessed: ROM, strength, coordination, functional mobility skills, and ADL tasks. Sponge bath and grooming tasks standing at the sink and LB ADL tasks. Educated on OT role, POC, safety precautions with mobility skills, and importance of OOb activity.    Activity Tolerance During Today's Session  Tolerated treatment well, Limited by fatigue    Plan  Planned Frequency of Treatment: Plan of Care Initiated: 12/30/23  D/C Services Weekly Frequency: D/C Services  Planned Treatment Duration: 12/30/23    Planned Interventions:   (No acute skilled Ot needs)    Barriers to Discharge: None    Subjective  Prior Functional Status At baseline he is Independent with ADLs, IADLs, and functional mobility skills with the [facility]. Reports at times his legs are fatigue or painful making IADLs tasks more challenging. Has intermittent support from friends. Does not drive (uses public transportation or has a ride from a friend), no recent falls.    Living Situation  Living Environment: Apartment  Lives With: Alone  Home Living: One level home, Stairs to enter with rails, Tub/shower unit  Rail placement (outside): Bilateral rails in reach  Number of Stairs to Enter (outside): 14  Caregiver Identified?: Yes  Caregiver Availability: Intermittent  Caregiver Identified?: No  Equipment available at home: Straight cane    Medical Tests / Procedures: Reviewed in EPIC       Patient / Caregiver reports: I don't have a lot of support at home      Past Medical History:   Diagnosis Date    Anemia 04/05/2022    Anxiety     Avascular necrosis of femur head, right   2011    Bipolar disorder   2013    Follows the Psychiatry    Cannabis use disorder     COPD (chronic obstructive pulmonary disease)   2022    Crohn's disease   1990    Depression 2007    Severe depressive episode with psychotic symptoms    DVT (deep venous thrombosis)  02/08/2010    DVT of superior vena cava, left brachiocephalic, 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    Myalgia     Rhinovirus infection 10/29/2022    SBO (small bowel obstruction)   2011    Social History     Tobacco Use    Smoking status: Every Day     Current packs/day: 1.00     Average packs/day: 0.6 packs/day for 22.2 years (12.7 ttl pk-yrs)     Types: Cigarettes     Start date: 10/02/2001     Passive exposure: Current    Smokeless tobacco: Never    Tobacco comments:     1ppd   Substance Use Topics    Alcohol use: No     Alcohol/week: 0.0 standard drinks of alcohol Past Surgical History:   Procedure Laterality Date    CHOLECYSTECTOMY      COLON SURGERY      partial resection    HERNIA REPAIR      PR ARTHRP INTERCARPAL/CARP/MTCRPL JT INTERPOSITION Left 08/07/2022    Procedure: INTERPOSIT ARTHROPLASTY-INTERCARPAL/CARPOMETACAR;  Surgeon: Adonna Hoots, MD;  Location: MAIN OR [facility];  Service: Orthopedics    PR CLOSE ENTEROSTOMY,RESEC+COLOREC ANAS Midline 11/01/2019    Procedure: CLO ENTEROSTOMY; W/RESECT Lethaniel Rave;  Surgeon: Eusebio High, MD;  Location: MAIN OR [place];  Service: Gastrointestinal    PR COLONOSCOPY FLX DX W/COLLJ SPEC WHEN PFRMD Left 01/07/2013    Procedure: COLONOSCOPY, FLEXIBLE, PROXIMAL TO SPLENIC FLEXURE; DIAGNOSTIC, W/WO COLLECTION SPECIMEN BY BRUSH OR WASH;  Surgeon: Lizbeth Right, MD;  Location: GI PROCEDURES MEMORIAL [facility];  Service: Gastroenterology    PR COLONOSCOPY FLX DX W/COLLJ SPEC WHEN PFRMD  03/09/2015    Procedure: COLONOSCOPY, FLEXIBLE, PROXIMAL TO SPLENIC FLEXURE; DIAGNOSTIC, W/WO COLLECTION SPECIMEN BY BRUSH OR WASH;  Surgeon: Delene Feinstein, MD;  Location: GI PROCEDURES MEM[facility]IAL [facility];  Service: Gastroenterology    PR COLONOSCOPY FLX DX W/COLLJ SPEC WHEN PFRMD N/A 10/28/2019    Procedure: COLONOSCOPY, FLEXIBLE, PROXIMAL TO SPLENIC FLEXURE; DIAGNOSTIC, W/WO COLLECTION SPECIMEN BY BRUSH OR WASH;  Surgeon: Sarajane Cumming, MD;  Location: GI PROCEDURES [facility]IAL [facility];  Service: Gastroenterology    PR COLONOSCOPY FLX DX W/COLLJ SPEC WHEN PFRMD N/A 10/01/2022    Procedure: COLONOSCOPY, FLEXIBLE, PROXIMAL TO SPLENIC FLEXURE; DIAGNOSTIC, W/WO COLLECTION SPECIMEN BY BRUSH OR WASH;  Surgeon: Rana Burr, MD;  Location: GI PROCEDURES MEMORIAL [facility];  Service: Gastroenterology    PR COLONOSCOPY FLX DX W/COLLJ SPEC WHEN PFRMD N/A 11/30/2023    Procedure: COLONOSCOPY, FLEXIBLE, PROXIMAL TO SPLENIC FLEXURE; DIAGNOSTIC, W/WO COLLECTION SPECIMEN BY BRUSH OR WASH;  Surgeon: Arlean Labs, MD;  Location: GI PROCEDURES [facility];  Service: Gastroenterology    PR EXPLORATORY OF ABDOMEN Midline 09/02/2019    Procedure: EXPLORATORY LAPAROTOMY, EXPLORATORY CELIOTOMY WITH OR WITHOUT BIOPSY(S);  Surgeon: Eusebio High, MD;  Location: MAIN OR [facility];  Service: Gastrointestinal    PR EXPLORATORY OF ABDOMEN Midline 11/01/2019    Procedure: EXPLORATORY LAPAROTOMY, EXPLORATORY CELIOTOMY WITH OR WITHOUT BIOPSY(S);  Surgeon: Eusebio High, MD;  Location: MAIN OR [facility];  Service: Gastrointestinal    PR FIX FINGER,VOLAR PLATE,I-P JT Left 08/07/2022    Procedure: REPAIR AND RECONSTRUCTION, FINGER, VOLAR PLATE, INTERPHALANGEAL JOINT;  Surgeon: Adonna Hoots, MD;  Location: MAIN OR [facility];  Service: Orthopedics    PR FREEING BOWEL ADHESION,ENTEROLYSIS N/A 11/01/2019    Procedure: Enterolysis (Separt Proc);  Surgeon: Eusebio High, MD;  Location: MAIN OR [facility];  Service: Gastrointestinal    PR FUSION MC-P JT Left 08/27/2023  Procedure: ARTHRODESIS, METACARPOPHALANGEAL JOINT, WITH OR WITHOUT INTERNAL FIXATION;  Surgeon: Adonna Hoots, MD;  Location: OR [facility];  Service: Orthopedics    PR ILEOSCOPY THRU STOMA,BIOPSY N/A 10/14/2019    Procedure: Erby Hatcher; Lamarr Pilling 1/MX;  Surgeon: Loel Ring, MD;  Location: GI PROCEDURES MEMOR[facility];  Service: Gastroenterology    PR PART REMOVAL COLON W COLOSTOMY Midline 09/02/2019    Procedure: COLECTOMY, PARTIAL; WITH SKIN LEVEL CECOSTOMY OR COLOSTOMY;  Surgeon: Eusebio High, MD;  Location: MAIN OR [facility];  Service: Gastrointestinal    PR REPAIR INCISIONAL HERNIA,REDUCIBLE Midline 09/02/2019    Procedure: REPAIR INIT INCISIONAL OR VENTRAL HERNIA; REDUCIBLE;  Surgeon: Caro Christmas, MD;  Location: MAIN OR [place];  Service: Trauma    PR UPPER GI ENDOSCOPY,BIOPSY N/A 01/07/2013    Procedure: UGI ENDOSCOPY; WITH BIOPSY, SINGLE OR MULTIPLE;  Surgeon: Lizbeth Right, MD;  Location: GI PROC[facility]URES [facility];  Service: Gastroenterology    PR UPPER GI ENDOSCOPY,BIOPSY N/A 10/14/2019    Procedure: UGI ENDOSCOPY; WITH BIOPSY, SINGLE OR MULTIPLE;  Surgeon: Loel Ring, MD;  Location: GI PROCEDURES [facility];  Service: Gastroenterology    PR UPPER GI ENDOSCOPY,DIAGNOSIS N/A 10/01/2022    Procedure: UGI ENDO, INCLUDE ESOPHAGUS, STOMACH, & DUODENUM &/OR JEJUNUM; DX W/WO COLLECTION SPECIMN, BY BRUSH OR WASH;  Surgeon: Rana Burr, MD;  Location: GI PROCEDURES [facility];  Service: Gastroenterology    Family History   Problem Relation Age of Onset    Breast cancer Mother     Diabetes Maternal Aunt     Colon cancer Maternal Grandmother     Prostate cancer Maternal Grandfather     Cancer Paternal Grandmother     Crohn's disease Neg Hx         Morphine  and Toradol [ketorolac]     Objective Findings  Precautions / Restrictions  Falls precautions, Isolation precautions  enteric precautions    Weight Bearing  Non-applicable    Required Braces or Orthoses  Non-applicable    Communication Preference  Verbal       Pain  Reports no pain    Equipment / Environment  Vascular access (PIV, TLC, Port-a-cath, PICC)     Cognition   Orientation Level:  Oriented x 4   Arousal/Alertness:  Appropriate responses to stimuli   Attention Span:  Appears intact   Memory:  Appears intact   Following Commands:  Follows all commands and directions without difficulty   Safety Judgment:  Good awareness of safety precautions   Awareness of Errors and Problem Solving:  Able to problem solve independently    Vision / Hearing   Vision: No acute deficits identified     Hearing: No deficit identified       Hand Function:  Right Hand Function: Right hand grip strength, ROM and coordination WNL  Left Hand Function: Left hand grip strength, ROM and coordination WNL  Hand Dominance: Right    Skin Inspection:  Skin Inspection: Intact where visualized    ROM / Strength:  UE ROM/Strength: Left WFL, Right WFL  LE ROM/Strength: Left WFL, Right WFL  LE ROM/ Strength Comment: Gneralized weakness    Coordination:  Coordination: WFL  Coordination comment: Arthritic joints right hand however above not affecting performance    Sensation:  RUE Sensation: RUE intact  LUE Sensation: LUE intact  RLE Sensation: RLE impaired  RLE Sensation Impairment: Numbness  LLE Sensation: LLE impaired  LLE Sensation Impairment: Numbness    Balance:  Sitting Balance comments: Static and  dynamic sitting balance are [facility]    Standing Balance comments: Static and dynamic standing balance: Mod I with SPC    Functional Mobility  Transfers:  (sit<>stand and functional transfers: Mod I with SPC)  Bed Mobility - Needs Assistance: Modified Independent  Ambulation: Independent with ambulation on the unit without assistive device    ADLs  ADLs: Independent/Modified Independent  IADLs: NT    Patient at end of session: All needs in reach, In bed, Lines intact, Notified Nurse     Occupational Therapy Session Duration  OT Individual [mins]: 32       AM-PAC-Daily Activity  Lower Body Dressing assistance needs: None - Modified Independent/Independent  Bathing assistance needs: A Little - Minimal/Contact Guard Assist/Supervision  Toileting assistance needs: None - Modified Independent/Independent  Upper Body Dressing assistance needs: None - Modified Independent/Independent  Personal Grooming assistance needs: None - Modified Independent/Independent  Eating Meals assistance needs: None - Modified Independent/Independent    Daily Activity Score: 23    Score (in points): % of Functional Impairment, Limitation, Restriction  6: 100% impaired, limited, restricted  7-8: At least 80%, but less than 100% impaired, limited restricted  9-13: At least 60%, but less than 80% impaired, limited restricted  14-19: At least 40%, but less than 60% impaired, limited restricted  20-22: At least 20%, but less than 40% impaired, limited restricted  23: At least 1%, but less than 20% impaired, limited restricted  24: 0% impaired, limited restricted    I attest that I have reviewed the above information.  Signed: Varonica Siharath, OT  Filed 12/30/2023

## 2023-12-30 NOTE — Unmapped (Addendum)
 PHYSICAL THERAPY  Evaluation (12/30/23 0932)          Patient Name:  Jerome Kennedy.       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 is a 48 y.o. male whose presentation is complicated by Crohn's disease s/p hemicolectomy, COPD, DVT (not on [facility]), bipolar disorder that presented to [facility] with abdominal pain, N/V, poor PO intolerance concerning for Crohn's flare. Pt is presenting to PT with impaired endurance. Pt was modI with all OOB mobility with SPC with no unsteadiness or LOB noted; due to pt presentation, can anticipate that pt will be modI with stair navigation. Pt reporting no falls in the past 6 months. Pt PLOF is modI with SPC; pt reporting that he is currently mobilizing at his functional baseline. Due to this, pt does not require skilled PT during this admission at this time. PT discharge recommendation is 3x community to address pt's decreased endurance. Pt is requesting a rollator for after discharge due to his concerns about decreased endurance. After a review of personal factors, comorbidities, clinical presentation, and examination of affected body systems, the patient presents as a moderate complexity case.      Today's Interventions: PT eval; pt education on PT role, POC, and discharge recommendations     Personal Factors/Comorbidities Present: 3+   Examination of Body System: Musculoskeletal, Cardiovascular, Pulmonary, Integumentary, Activity/participation  Clinical Presentation: Evolving    Clinical Decision Making: Moderate PLAN  Planned Frequency of Treatment: Plan of Care Initiated: 12/30/23  D/C Services Weekly Frequency: D/C Services        Planned Interventions: other (none)     Goals:   Patient and Family Goals: To get a rollator.                                                                                          Prognosis:  Good  Positive Indicators: PLOF  Barriers to Discharge: None     SUBJECTIVE  Communication Preference: Verbal     Patient reports: RN cleared pt for therapy; pt agreeable to PT  Pain Comments: denies pain        Prior Functional Status: Pt reports that he is modI for ambulation with SPC. Denies any falls in the past 6 months. Lives alone with no caregiver support available.  Living Situation  Living Environment: Apartment  Lives With: Alone  Home Living: One level home, Stairs to enter with rails, Tub/shower unit  Rail placement (outside): Bilateral rails in reach  Number of Stairs to Enter (outside): 14  Caregiver Identified?: No  Caregiver Identified?: No   Equipment available at home: Straight cane  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    Myalgia     Rhinovirus infection 10/29/2022    SBO (small bowel obstruction)   2011            Social History     Tobacco Use    Smoking status: Every Day     Current packs/day: 1.00     Average packs/day: 0.6 packs/day for 22.2 years (12.7 ttl pk-yrs)     Types: Cigarettes     Start date: 10/02/2001     Passive exposure: Current    Smokeless tobacco: Never    Tobacco comments:     1ppd   Substance Use Topics    Alcohol use: No Alcohol/week: 0.0 standard drinks of alcohol       Past Surgical History:   Procedure Laterality Date    CHOLECYSTECTOMY      COLON SURGERY      partial resection    HERNIA REPAIR      PR ARTHRP INTERCARPAL/CARP/MTCRPL JT INTERPOSITION Left 08/07/2022    Procedure: INTERPOSIT ARTHROPLASTY-INTERCARPAL/CARPOMETACAR;  Surgeon: Adonna Hoots, MD;  Location: MAIN OR [facility];  Service: Orthopedics    PR CLOSE ENTEROSTOMY,RESEC+COLOREC ANAS Midline 11/01/2019    Procedure: CLO ENTEROSTOMY; W/RESECT Lethaniel Rave;  Surgeon: Eusebio High, MD;  Location: MAIN OR [place];  Service: Gastrointestinal    PR COLONOSCOPY FLX DX W/COLLJ SPEC WHEN PFRMD Left 01/07/2013    Procedure: COLONOSCOPY, FLEXIBLE, PROXIMAL TO SPLENIC FLEXURE; DIAGNOSTIC, W/WO COLLECTION SPECIMEN BY BRUSH OR WASH;  Surgeon: Lizbeth Right, MD;  Location: GI PROCEDURES [facility];  Service: Gastroenterology    PR COLONOSCOPY FLX DX W/COLLJ SPEC WHEN PFRMD  03/09/2015    Procedure: COLONOSCOPY, FLEXIBLE, PROXIMAL TO SPLENIC FLEXURE; DIAGNOSTIC, W/WO COLLECTION SPECIMEN BY BRUSH OR WASH;  Surgeon: Delene Feinstein, MD;  Location: GI PROCEDURES [facility];  Service: Gastroenterology    PR COLONOSCOPY FLX DX W/COLLJ SPEC WHEN PFRMD N/A 10/28/2019    Procedure: COLONOSCOPY, FLEXIBLE, PROXIMAL TO SPLENIC FLEXURE; DIAGNOSTIC, W/WO COLLECTION SPECIMEN BY BRUSH OR WASH;  Surgeon: Sarajane Cumming, MD;  Location: GI PROCEDURES [facility];  Service: Gastroenterology    PR COLONOSCOPY FLX DX W/COLLJ SPEC WHEN PFRMD N/A 10/01/2022    Procedure: COLONOSCOPY, FLEXIBLE, PROXIMAL TO SPLENIC FLEXURE; DIAGNOSTIC, W/WO COLLECTION SPECIMEN BY BRUSH OR WASH;  Surgeon: Rana Burr, MD;  Location: GI PROCEDURES [facility];  Service: Gastroenterology    PR COLONOSCOPY FLX DX W/COLLJ SPEC WHEN PFRMD N/A 11/30/2023    Procedure: COLONOSCOPY, FLEXIBLE, PROXIMAL TO SPLENIC FLEXURE; DIAGNOSTIC, W/WO COLLECTION SPECIMEN BY BRUSH OR WASH;  Surgeon: Arlean Labs, MD; Location: GI PROCEDURES [facility];  Service: Gastroenterology    PR EXPLORATORY OF ABDOMEN Midline 09/02/2019    Procedure: EXPLORATORY LAPAROTOMY, EXPLORATORY CELIOTOMY WITH OR WITHOUT BIOPSY(S);  Surgeon: Eusebio High, MD;  Location: MAIN OR [facility];  Service: Gastrointestinal    PR EXPLORATORY OF ABDOMEN Midline 11/01/2019  Procedure: EXPLORATORY LAPAROTOMY, EXPLORATORY CELIOTOMY WITH OR WITHOUT BIOPSY(S);  Surgeon: Eusebio High, MD;  Location: MAIN OR [facility];  Service: Gastrointestinal    PR FIX FINGER,VOLAR PLATE,I-P JT Left 08/07/2022    Procedure: REPAIR AND RECONSTRUCTION, FINGER, VOLAR PLATE, INTERPHALANGEAL JOINT;  Surgeon: Adonna Hoots, MD;  Location: MAIN OR [facility];  Service: Orthopedics    PR FREEING BOWEL ADHESION,ENTEROLYSIS N/A 11/01/2019    Procedure: Enterolysis (Separt Proc);  Surgeon: Eusebio High, MD;  Location: MAIN OR [facility];  Service: Gastrointestinal    PR FUSION MC-P JT Left 08/27/2023    Procedure: ARTHRODESIS, METACARPOPHALANGEAL JOINT, WITH OR WITHOUT INTERNAL FIXATION;  Surgeon: Adonna Hoots, MD;  Location: OR [facility];  Service: Orthopedics    PR ILEOSCOPY THRU STOMA,BIOPSY N/A 10/14/2019    Procedure: Erby Hatcher; Lamarr Pilling 1/MX;  Surgeon: Loel Ring, MD;  Location: GI PROCEDURES [facility];  Service: Gastroenterology    PR PART REMOVAL COLON W COLOSTOMY Midline 09/02/2019    Procedure: COLECTOMY, PARTIAL; WITH SKIN LEVEL CECOSTOMY OR COLOSTOMY;  Surgeon: Eusebio High, MD;  Location: MAIN OR [facility];  Service: Gastrointestinal    PR REPAIR INCISIONAL HERNIA,REDUCIBLE Midline 09/02/2019    Procedure: REPAIR INIT INCISIONAL OR VENTRAL HERNIA; REDUCIBLE;  Surgeon: Caro Christmas, MD;  Location: MAIN OR [place];  Service: Trauma    PR UPPER GI ENDOSCOPY,BIOPSY N/A 01/07/2013    Procedure: UGI ENDOSCOPY; WITH BIOPSY, SINGLE OR MULTIPLE;  Surgeon: Lizbeth Right, MD;  Location: GI PROCEDURES [facility];  Service: Gastroenterology    PR UPPER GI ENDOSCOPY,BIOPSY N/A 10/14/2019    Procedure: UGI ENDOSCOPY; WITH BIOPSY, SINGLE OR MULTIPLE;  Surgeon: Loel Ring, MD;  Location: GI PROCEDURES [facility];  Service: Gastroenterology    PR UPPER GI ENDOSCOPY,DIAGNOSIS N/A 10/01/2022    Procedure: UGI ENDO, INCLUDE ESOPHAGUS, STOMACH, & DUODENUM &/OR JEJUNUM; DX W/WO COLLECTION SPECIMN, BY BRUSH OR WASH;  Surgeon: Rana Burr, MD;  Location: GI PROCEDURES [facility];  Service: Gastroenterology             Family History   Problem Relation Age of Onset    Breast cancer Mother     Diabetes Maternal Aunt     Colon cancer Maternal Grandmother     Prostate cancer Maternal Grandfather     Cancer Paternal Grandmother     Crohn's disease Neg Hx         Allergies: Morphine  and Toradol [ketorolac]                  Objective Findings  Precautions / Restrictions  Precautions: Falls precautions, Isolation precautions  Weight Bearing Status: Non-applicable  Required Braces or Orthoses: Non-applicable  Precautions / Restrictions comments: enteric precautions        Equipment / Environment: Vascular access (PIV, TLC, Port-a-cath, PICC)     Vitals/Orthostatics : VSS per EPIC; pt asymptomatic throughout PT eval     Cognition: WFL  Orientation: Oriented x4  Visual/Perception: Within Functional Limits  Hearing: No deficit identified     Skin Inspection: Intact where visualized     Upper Extremities  UE ROM: Right WFL, Left WFL  UE Strength: Right WFL, Left WFL    Lower Extremities  LE ROM: Right WFL, Left WFL  LE Strength: Right WFL, Left WFL          Coordination: Not tested  Proprioception: Not tested  Sensation: Not tested  Posture: WFL    Static Sitting-Level of Assistance: Independent  Dynamic Sitting-Level of Assistance: Independent  Sitting Balance comments: Pt completed static/dynamic sitting EOB indep.    Static Standing-Level of Assistance: Independent  Dynamic Standing - Level of Assistance: Independent  Standing Balance comments: Pt completed static/dynamic standing balance with modI using SPC      Bed Mobility: Supine to Sit  Supine to Sit assistance level: Independent  Bed Mobility comments: Pt completed supine <> sit bed mobility indep. with HOB flat without the use of bedrails     Transfers: Sit to Stand  Sit to Stand assistance level: Independent  Transfer comments: Pt completed STS from bed independently with SPC.      Gait Level of Assistance: Independent  Gait Assistive Device: Straight Cane  Gait Distance Ambulated (ft): 100 ft  Skilled Treatment Performed: Pt ambulated 169ft in room with SPC, modI. Pt ambulated with reciprocal gait pattern, wide BOS, and lateral trunk sway with no unsteadiness or LOB noted.     Stairs: NT      Wheelchair Mobility: NA     Endurance: fair    Patient at end of session: All needs in reach, In bed, Lines intact, Notified Nurse    Physical Therapy Session Duration  PT Individual [mins]: 15          AM-PAC-6 click  Help currently need turning over In bed?: None - Modified Independent/Independent  Help currently needed sitting down/standing up from chair with arms? : None - Modified Independent/Independent  Help currently needed moving from supine to sitting on edge of bed?: None - Modified Independent/Independent  Help currently needed moving to and from bed from wheelchair?: None - Modified Independent/Independent  Help currently needed walking in a hospital room?: None - Modified Independent/Independent  Help currently needed climbing 3-5 steps with railing?: None - Modified Independent/Independent    Basic Mobility Score 6 click: 24    6 click Score (in points): % of Functional Impairment, Limitation, Restriction  6: 100% impaired, limited, restricted  7-8: At least 80%, but less than 100% impaired, limited restricted  9-13: At least 60%, but less than 80% impaired, limited restricted  14-19: At least 40%, but less than 60% impaired, limited restricted  20-22: At least 20%, but less than 40% impaired, limited restricted  23: At least 1%, but less than 20% impaired, limited restricted  24: 0% impaired, limited restricted    'AM-PAC' forms are Copyright protected by [employer] [facility]         I attest that I have reviewed the above information.  SignedCassandria Clever, PT  Filed 12/30/2023

## 2023-12-31 LAB — BASIC METABOLIC PANEL
ANION GAP: 11 mmol/L (ref 5–14)
BLOOD UREA NITROGEN: 10 mg/dL (ref 9–23)
BUN / CREAT RATIO: 9
CALCIUM: 8.9 mg/dL (ref 8.7–10.4)
CHLORIDE: 108 mmol/L — ABNORMAL HIGH (ref 98–107)
CO2: 23 mmol/L (ref 20.0–31.0)
CREATININE: 1.17 mg/dL (ref 0.73–1.18)
EGFR CKD-EPI (2021) MALE: 77 mL/min/1.73m2 (ref >=60)
GLUCOSE RANDOM: 97 mg/dL (ref 70–179)
POTASSIUM: 3.6 mmol/L (ref 3.4–4.8)
SODIUM: 142 mmol/L (ref 135–145)

## 2023-12-31 LAB — SEDIMENTATION RATE: ERYTHROCYTE SEDIMENTATION RATE: 2 mm/h (ref 0–15)

## 2023-12-31 LAB — MAGNESIUM: MAGNESIUM: 1.6 mg/dL (ref 1.6–2.6)

## 2023-12-31 LAB — CBC
HEMATOCRIT: 44 % (ref 39.0–48.0)
HEMOGLOBIN: 14.8 g/dL (ref 12.9–16.5)
MEAN CORPUSCULAR HEMOGLOBIN CONC: 33.7 g/dL (ref 32.0–36.0)
MEAN CORPUSCULAR HEMOGLOBIN: 28.5 pg (ref 25.9–32.4)
MEAN CORPUSCULAR VOLUME: 84.5 fL (ref 77.6–95.7)
MEAN PLATELET VOLUME: 8.2 fL (ref 6.8–10.7)
PLATELET COUNT: 126 10*9/L — ABNORMAL LOW (ref 150–450)
RED BLOOD CELL COUNT: 5.2 10*12/L (ref 4.26–5.60)
RED CELL DISTRIBUTION WIDTH: 17.3 % — ABNORMAL HIGH (ref 12.2–15.2)
WBC ADJUSTED: 5.9 10*9/L (ref 3.6–11.2)

## 2023-12-31 LAB — C-REACTIVE PROTEIN: C-REACTIVE PROTEIN: <5 mg/L (ref <=10.0)

## 2023-12-31 MED ORDER — NICOTINE (POLACRILEX) 4 MG GUM
BUCCAL | 0 refills | 5.00000 days | Status: CP | PRN
Start: 2023-12-31 — End: 2024-01-30
  Filled 2023-12-31: qty 144, 6d supply, fill #0
  Filled 2023-12-31: qty 110, 5d supply, fill #0

## 2023-12-31 MED ORDER — NICOTINE (POLACRILEX) 2 MG BUCCAL LOZENGE
BUCCAL | 0 refills | 6.00000 days | Status: CP | PRN
Start: 2023-12-31 — End: 2024-01-30

## 2023-12-31 MED ADMIN — QUEtiapine (SEROQUEL) tablet 100 mg: 100 mg | ORAL

## 2023-12-31 MED ADMIN — hydrOXYzine (ATARAX) tablet 10 mg: 10 mg | ORAL | @ 07:00:00 | Stop: 2023-12-31

## 2023-12-31 MED ADMIN — prochlorperazine (COMPAZINE) injection 5 mg: 5 mg | INTRAVENOUS | @ 07:00:00 | Stop: 2023-12-31

## 2023-12-31 MED ADMIN — enoxaparin (LOVENOX) syringe 40 mg: 40 mg | SUBCUTANEOUS

## 2023-12-31 MED ADMIN — predniSONE (DELTASONE) tablet 40 mg: 40 mg | ORAL | @ 12:00:00 | Stop: 2023-12-31

## 2023-12-31 MED ADMIN — prochlorperazine (COMPAZINE) injection 5 mg: 5 mg | INTRAVENOUS

## 2023-12-31 MED ADMIN — acetaminophen (TYLENOL) tablet 1,000 mg: 1000 mg | ORAL | @ 12:00:00 | Stop: 2023-12-31

## 2023-12-31 MED ADMIN — loperamide (IMODIUM) capsule 2 mg: 2 mg | ORAL | @ 12:00:00 | Stop: 2023-12-31

## 2023-12-31 MED ADMIN — cariprazine (VRAYLAR) capsule 1.5 mg: 1.5 mg | ORAL | @ 12:00:00 | Stop: 2023-12-31

## 2023-12-31 MED ADMIN — oxyCODONE (ROXICODONE) immediate release tablet 5 mg: 5 mg | ORAL | @ 07:00:00 | Stop: 2023-12-31

## 2023-12-31 MED ADMIN — fluticasone furoate-vilanterol (BREO ELLIPTA) 100-25 mcg/dose inhaler 1 puff: 1 | RESPIRATORY_TRACT | @ 12:00:00 | Stop: 2023-12-31

## 2023-12-31 MED ADMIN — hydrOXYzine (ATARAX) tablet 10 mg: 10 mg | ORAL | @ 14:00:00 | Stop: 2023-12-31

## 2023-12-31 MED ADMIN — oxyCODONE (ROXICODONE) immediate release tablet 5 mg: 5 mg | ORAL | @ 12:00:00 | Stop: 2023-12-31

## 2023-12-31 MED ADMIN — oxyCODONE (ROXICODONE) immediate release tablet 5 mg: 5 mg | ORAL | Stop: 2024-01-10

## 2023-12-31 MED ADMIN — pantoprazole (Protonix) EC tablet 40 mg: 40 mg | ORAL | @ 12:00:00 | Stop: 2023-12-31

## 2023-12-31 MED ADMIN — hydrOXYzine (ATARAX) tablet 10 mg: 10 mg | ORAL

## 2023-12-31 MED ADMIN — cholecalciferol (vitamin D3 25 mcg (1,000 units)) tablet 25 mcg: 25 ug | ORAL | @ 12:00:00 | Stop: 2023-12-31

## 2023-12-31 MED ADMIN — upadacitinib Tb24 45 mg: 45 mg | ORAL | @ 14:00:00 | Stop: 2023-12-31

## 2023-12-31 MED ADMIN — ondansetron (ZOFRAN-ODT) disintegrating tablet 4 mg: 4 mg | ORAL | @ 12:00:00 | Stop: 2023-12-31

## 2023-12-31 NOTE — Unmapped (Signed)
 Shift Summary   prochlorperazine  and oxyCODONE  were administered PRN at the beginning of the shift.    hydrOXYzine  was administered PRN for itching twice during the shift.    Compazine  was administered, and the patient was observed to be asleep and relaxed.    oxyCODONE  was administered again later in the shift.    Overall, the patient remained stable with no signs of infection or hospital-acquired injury, but pain control was not fully achieved.     Absence of Hospital-Acquired Illness or Injury: Positioning and pressure reduction techniques were consistently applied throughout the shift, with no signs of skin breakdown or injury noted.     Optimal Comfort and Wellbeing: Comfort was maintained with the use of PRN medications, and the patient was observed to be asleep and relaxed after administration.     Absence of Infection Signs and Symptoms: Vital signs remained stable with no fever or other signs of infection observed during the shift.     Optimal Pain Control and Function: Abdominal pain was reported at a level of 7, and oxyCODONE  was administered PRN, but pain did not reach the patient's stated goal of no pain.     Maintenance of COPD Symptom Control: Respiratory status was stable with diminished breath sounds noted, but no acute respiratory distress was observed.

## 2023-12-31 NOTE — Unmapped (Signed)
 Physician Discharge Summary [facility]  1 [facility] OBSERVATION [facility]  [address]  [place] [address]  Dept: [phone]  Loc: [phone]     Identifying Information:   Jerome Kennedy  June 15, 1976  [id number]    Primary Care Physician: Francois Isaacs, MD     Code Status: Full Code    Admit Date: 12/27/2023    Discharge Date: 12/31/2023     Discharge To: Home with Home Health and/or PT/OT    Discharge Service: [facility] - General Medicine Floor Team (MED Sydna Evangelist)     Discharge Attending Physician: Eve Hinders, MD    Discharge Diagnoses:   Principal Problem:    Abdominal pain (POA: Yes)  Active Problems:    Crohn's disease   (POA: Yes)    Bipolar 1 disorder   (POA: Yes)    COPD (chronic obstructive pulmonary disease)   (POA: Yes)    Hx of deep venous thrombosis (POA: Not Applicable)    Tobacco use disorder (POA: Yes)  Resolved Problems:    * No resolved hospital problems. [facility] Course:   Outpatient follow-up tasks:  [ ]  continue PJP ppx with bactrim  until prednisone  taper decreases below 20mg   [ ]  will follow up with GI to consider changing therapy from Rinvoq         Jerome Kennedy. is a 48 y.o. male whose presentation is complicated by Crohn's disease s/p hemicolectomy, COPD, DVT (not on [facility]), bipolar disorder that presented to [facility] with abdominal pain, N/V, poor PO intolerance concerning for Crohn's flare.     Ileitis - Crohn's disease - Concern for Crohn's flare   Presented with roughly 1 week of diarrhea followed by roughly 1 day severe abdominal pain, nausea, vomiting.  Presentation less consistent with true flare given normal ESR/CRP, likely represents inadequate treatment with Rinvoq , with concerns for poor absoprtion due to seeing pills in stool. Per GI, plans to transition to injectable therapy outpatient. Scope deferred as he had only recently been switched to Rinvoq , would not expect to see endoscopic changes yet. Per GI, restarted prednisone  taper at 40 mg, continue Rinvoq  until appointment, has outpatient appointment within 2 weeks of dc.        Chronic Problems  COPD: Given formulary equivalent of home Symbicort  throughout admission.  Resumed home Symbicort  upon discharge.  Hx DVT (not on [facility])  History of multiple DVTs since 2006, including RUE and neck veins.  Not on lifelong anticoagulation per advice of heme due to bleeding risk with IBD (see note from 2022 [facility] hospitalization).  Given DVT prophylaxis during admission.  Bipolar disorder: Continued home Vraylar  and Seroquel  throughout admission and upon discharge.  The patient's hospital stay has been complicated by the following clinically significant conditions requiring additional evaluation and treatment or having a significant effect of this patient's care: - Thrombocytopenia POA requiring further investigation or monitor     Outpatient Provider Follow Up Issues:   [ ]  continue PJP ppx with bactrim  until prednisone  taper decreases below 20mg   [ ]  will follow up with GI to consider changing therapy from Rinvoq     Touchbase with Outpatient Provider:  Warm Handoff: Completed on 12/31/23 by Mel Spine, MD  (Intern) via [facility] Message    Procedures:  None  ______________________________________________________________________  Discharge Medications:      Your Medication List        STOP taking these medications      upadacitinib  30 mg tablet  Commonly known as: RINVOQ             START taking these medications      loperamide  2 mg capsule  Commonly known as: IMODIUM   Take 1 capsule (2 mg total) by mouth in the morning. Take 1 hour before Rinvoq .  Start taking on: Jan 01, 2024     nicotine  21 mg/24 hr patch  Commonly known as: NICODERM CQ   Place 1 patch on the skin daily.  Start taking on: Jan 01, 2024     nicotine  polacrilex 4 MG gum  Commonly known as: NICORETTE  Apply 1 each (4 mg total) to cheek every hour as needed for smoking cessation.     nicotine  polacrilex 2 MG lozenge  Commonly known as: NICORETTE  Apply 1 lozenge (2 mg total) to cheek every hour as needed for smoking cessation.     RINVOQ  45 mg Tb24  Generic drug: upadacitinib   Take 1 tablet (45 mg total) by mouth daily.     sulfamethoxazole -trimethoprim  800-160 mg per tablet  Commonly known as: BACTRIM  DS  Take 1 tablet (160 mg of trimethoprim  total) by mouth 3 (three) times a week.  Start taking on: Jan 01, 2024            CHANGE how you take these medications      predniSONE  10 MG tablet  Commonly known as: DELTASONE   Take 4 tablets (40 mg total) by mouth daily for 12 days, THEN 3 tablets (30 mg total) daily for 7 days, THEN 2 tablets (20 mg total) daily for 7 days, THEN 1.5 tablets (15 mg total) daily for 7 days, THEN 1 tablet (10 mg total) daily for 7 days, THEN 0.5 tablets (5 mg total) daily for 7 days.  Start taking on: Jan 01, 2024  What changed: See the new instructions.            CONTINUE taking these medications      cholecalciferol  (vitamin D3 25 mcg (1,000 units)) 1,000 unit (25 mcg) tablet  Take 1 tablet (25 mcg total) by mouth daily.     dicyclomine  10 mg capsule  Commonly known as: BENTYL   Take 1 capsule (10 mg total) by mouth four (4) times a day as needed.     empty container Misc  Use as directed     ferrous sulfate  325 (65 FE) MG tablet  Take 1 tablet (325 mg total) by mouth in the morning.     FLONASE  ALLERGY RELIEF 50 mcg/actuation nasal spray  Generic drug: fluticasone  propionate  2 sprays into each nostril daily as needed.     ondansetron  4 MG disintegrating tablet  Commonly known as: ZOFRAN -ODT  Dissolve 1 tablet (4 mg total) in the mouth every eight (8) hours as needed for nausea.     OPTICHAMBER DIAMOND VHC Spcr  Generic drug: inhalational spacing device     pantoprazole  40 MG tablet  Commonly known as: Protonix   Take 1 tablet (40 mg total) by mouth daily.     QUEtiapine  100 MG tablet  Commonly known as: SEROQUEL   Take 1 tablet (100 mg total) by mouth nightly.     SYMBICORT  80-4.5 mcg/actuation inhaler  Generic drug: budesonide -formoterol   Inhale 2 puffs  in the morning.     VENTOLIN  HFA 90 mcg/actuation inhaler  Generic drug: albuterol   Inhale 2 puffs every six (6) hours as needed.     VRAYLAR  1.5 mg capsule  Generic drug: cariprazine   Take 1 capsule (1.5 mg total) by  mouth daily.              Allergies:  Morphine  and Toradol [ketorolac]  ______________________________________________________________________  Pending Test Results:  Pending Labs       Order Current Status    Calprotectin, Stool In process            Most Recent Labs:  CBC - Results in Past 30 Days  Result Component Current Result Ref Range Previous Result Ref Range   HCT 43.3 (12/30/2023) 39.0 - 48.0 % 41.2 (12/29/2023) 39.0 - 48.0 %   HGB 14.4 (12/30/2023) 12.9 - 16.5 g/dL 91.4 (7/82/9562) 13.0 - 16.5 g/dL   MCH 86.5 (7/84/6962) 25.9 - 32.4 pg 27.4 (12/29/2023) 25.9 - 32.4 pg   MCHC 33.3 (12/30/2023) 32.0 - 36.0 g/dL 95.2 (8/41/3244) 01.0 - 36.0 g/dL   MCV 27.2 (5/36/6440) 77.6 - 95.7 fL 83.1 (12/29/2023) 77.6 - 95.7 fL   MPV 8.3 (12/30/2023) 6.8 - 10.7 fL 8.8 (12/29/2023) 6.8 - 10.7 fL   Platelet 141 (L) (12/30/2023) 150 - 450 10*9/L 98 (L) (12/29/2023) 150 - 450 10*9/L   RBC 5.15 (12/30/2023) 4.26 - 5.60 10*12/L 4.95 (12/29/2023) 4.26 - 5.60 10*12/L   WBC 6.2 (12/30/2023) 3.6 - 11.2 10*9/L 5.2 (12/29/2023) 3.6 - 11.2 10*9/L     BMP - Results in Past 30 Days  Result Component Current Result Ref Range Previous Result Ref Range   BUN 9 (12/30/2023) 9 - 23 mg/dL 8 (L) (3/47/4259) 9 - 23 mg/dL   Chloride 563 (H) (8/75/6433) 98 - 107 mmol/L 111 (H) (12/29/2023) 98 - 107 mmol/L   CO2 24.0 (12/30/2023) 20.0 - 31.0 mmol/L 22.0 (12/29/2023) 20.0 - 31.0 mmol/L   Creatinine 0.95 (12/30/2023) 0.73 - 1.18 mg/dL 2.95 (1/88/4166) 0.63 - 1.18 mg/dL   Glucose 82 (0/16/0109) 70 - 179 mg/dL 79 (10/25/5571) 70 - 220 mg/dL   Potassium 3.8 (2/54/2706) 3.4 - 4.8 mmol/L 3.5 (12/29/2023) 3.4 - 4.8 mmol/L   Sodium 144 (12/30/2023) 135 - 145 mmol/L 141 (12/29/2023) 135 - 145 mmol/L       Relevant Studies/Radiology:  CT Abdomen Pelvis W IV Contrast   Final Result   Mural thickening and hyperenhancement involving the distal ileum, which may reflect persistent or recurrent ileitis. No evidence of bowel obstruction.      Chronic and incidental findings as noted above.             ______________________________________________________________________  Discharge Instructions:       Appointments which have been scheduled for you      Jan 14, 2024 3:30 PM  (Arrive by 3:15 PM)  RETURN IBD with Shelvy Dickens, MD  [facility] GI MEDICINE [place] [facility]) [address]  [facility] [address]  [place] [address]  [phone]             ______________________________________________________________________  Discharge Day Services:  BP 128/89  - Pulse 86  - Temp 37 ??C (98.6 ??F) (Oral)  - Resp 20  - Ht 172.7 cm (5' 8)  - Wt 65.8 kg (145 lb)  - SpO2 92%  - BMI 22.05 kg/m??     Pt seen on the day of discharge and determined appropriate for discharge.    Condition at Discharge: good    Length of Discharge: I spent greater than 30 mins in the discharge of this patient.    Mel Spine MD  Internal Medicine PGY1

## 2023-12-31 NOTE — Unmapped (Signed)
 Jerome Kohli Jr. has been contacted in regards to their refill of Rinvoq . At this time, they have declined refill due to patient waiting on therapy change,potentially changing to [facility] (has GI appointment on 6/12 to discuss therapy futher). Refill assessment call date has been updated per the patient's request.

## 2024-01-01 MED ORDER — PREDNISONE 10 MG TABLET
ORAL_TABLET | ORAL | 0 refills | 47.00000 days | Status: CP
Start: 2024-01-01 — End: 2024-02-17

## 2024-01-01 MED ORDER — LOPERAMIDE 2 MG CAPSULE
ORAL_CAPSULE | Freq: Every day | ORAL | 0 refills | 30.00000 days | Status: CP
Start: 2024-01-01 — End: 2024-01-31
  Filled 2023-12-31: qty 30, 30d supply, fill #0

## 2024-01-01 MED ORDER — NICOTINE 21 MG/24 HR DAILY TRANSDERMAL PATCH
MEDICATED_PATCH | Freq: Every day | TRANSDERMAL | 0 refills | 28.00000 days | Status: CP
Start: 2024-01-01 — End: ?
  Filled 2023-12-31: qty 28, 28d supply, fill #0

## 2024-01-01 MED ORDER — SULFAMETHOXAZOLE 800 MG-TRIMETHOPRIM 160 MG TABLET
ORAL_TABLET | ORAL | 0 refills | 28.00000 days | Status: CP
Start: 2024-01-01 — End: ?
  Filled 2023-12-31: qty 12, 28d supply, fill #0

## 2024-01-03 ENCOUNTER — Emergency Department
Admit: 2024-01-03 | Discharge: 2024-01-04 | Disposition: A | Discharge status: Home / Self Care | Payer: Medicaid (Managed Care)

## 2024-01-03 LAB — COMPREHENSIVE METABOLIC PANEL
ALBUMIN: 3.3 g/dL — ABNORMAL LOW (ref 3.4–5.0)
ALKALINE PHOSPHATASE: 134 U/L — ABNORMAL HIGH (ref 46–116)
ALT (SGPT): 33 U/L (ref 10–49)
ANION GAP: 8 mmol/L (ref 5–14)
AST (SGOT): 23 U/L (ref <=34)
BILIRUBIN TOTAL: 0.3 mg/dL (ref 0.3–1.2)
BLOOD UREA NITROGEN: 16 mg/dL (ref 9–23)
BUN / CREAT RATIO: 12
CALCIUM: 8.6 mg/dL — ABNORMAL LOW (ref 8.7–10.4)
CHLORIDE: 110 mmol/L — ABNORMAL HIGH (ref 98–107)
CO2: 25 mmol/L (ref 20.0–31.0)
CREATININE: 1.39 mg/dL — ABNORMAL HIGH (ref 0.73–1.18)
EGFR CKD-EPI (2021) MALE: 63 mL/min/1.73m2 (ref >=60)
GLUCOSE RANDOM: 105 mg/dL (ref 70–179)
POTASSIUM: 4.1 mmol/L (ref 3.4–4.8)
PROTEIN TOTAL: 5.9 g/dL (ref 5.7–8.2)
SODIUM: 143 mmol/L (ref 135–145)

## 2024-01-03 LAB — CBC W/ AUTO DIFF
BASOPHILS ABSOLUTE COUNT: 0 10*9/L (ref 0.0–0.1)
BASOPHILS RELATIVE PERCENT: 0.5 %
EOSINOPHILS ABSOLUTE COUNT: 0.1 10*9/L (ref 0.0–0.5)
EOSINOPHILS RELATIVE PERCENT: 1.3 %
HEMATOCRIT: 39.8 % (ref 39.0–48.0)
HEMOGLOBIN: 13.1 g/dL (ref 12.9–16.5)
LYMPHOCYTES ABSOLUTE COUNT: 2.3 10*9/L (ref 1.1–3.6)
LYMPHOCYTES RELATIVE PERCENT: 34.4 %
MEAN CORPUSCULAR HEMOGLOBIN CONC: 33 g/dL (ref 32.0–36.0)
MEAN CORPUSCULAR HEMOGLOBIN: 27.8 pg (ref 25.9–32.4)
MEAN CORPUSCULAR VOLUME: 84.2 fL (ref 77.6–95.7)
MEAN PLATELET VOLUME: 8.3 fL (ref 6.8–10.7)
MONOCYTES ABSOLUTE COUNT: 0.6 10*9/L (ref 0.3–0.8)
MONOCYTES RELATIVE PERCENT: 8.5 %
NEUTROPHILS ABSOLUTE COUNT: 3.7 10*9/L (ref 1.8–7.8)
NEUTROPHILS RELATIVE PERCENT: 55.3 %
PLATELET COUNT: 137 10*9/L — ABNORMAL LOW (ref 150–450)
RED BLOOD CELL COUNT: 4.73 10*12/L (ref 4.26–5.60)
RED CELL DISTRIBUTION WIDTH: 18 % — ABNORMAL HIGH (ref 12.2–15.2)
WBC ADJUSTED: 6.8 10*9/L (ref 3.6–11.2)

## 2024-01-03 LAB — LIPASE: LIPASE: 43 U/L (ref 12–53)

## 2024-01-03 LAB — C-REACTIVE PROTEIN: C-REACTIVE PROTEIN: <5 mg/L (ref <=10.0)

## 2024-01-03 LAB — SEDIMENTATION RATE: ERYTHROCYTE SEDIMENTATION RATE: <1 mm/h (ref 0–15)

## 2024-01-03 NOTE — Unmapped (Signed)
 Pt here by EMS, n/v and abd pain, abd rigid with extensive abd hx. 50mcg fentanyl  IM and PO Zofran  given en route. VS with EMS

## 2024-01-03 NOTE — Unmapped (Signed)
 [facility]  Emergency Department Provider Note      ED Clinical Impression       Diagnosis ICD-10-CM Associated Orders   1. Abdominal pain, unspecified abdominal location  R10.9                Impression, Medical Decision Making, Progress Notes and Critical Care      Impression, Differential Diagnosis and Plan of Care    Jerome Kennedy is a 48 y.o. male with a PMH of Crohn's disease s/p hemicolectomy, cholecystectomy, SBO, COPD, DVT, bipolar 1 disorder who presents for RLQ pain, N/V, and pruritus x 2 days. Also endorses chills and malaise. Patient reports he has been unable to keep his medications down for last two days due to vomiting.     VSS, afebrile. On exam patient is in no acute distress and resting comfortably. His abdomen is soft and non-distended with tenderness to palpation of RLQ without rebound or guarding.     Of note, patient has had 9 ED visits since February for similar symptoms and a multitude of CT A/Ps over the last year. Most recently discharged on 12/31/23 for same symptoms.    Currently rates pain as a 9/10. Requesting Prochlorperazine  and Benadryl  by name, in addition to pain medication.  Differential diagnosis includes true Crohn's flare, SBO, appendicitis. Will administer Dilaudid , Prochlorperazine , Benadryl . Check labs, CT A/P.       2100    Due to shift change care signed out with oncoming provider to follow up with imaging as above.    Additional MDM Elements             I have reviewed recent and relavant previous record, including: Inpatient notes -    Outpatient notes -               Portions of this record have been created using Dragon dictation software. Dictation errors have been sought, but may not have been identified and corrected.    See chart and nursing documentation for additional ED course details.    ____________________________________________         History      Reason for Visit  Abdominal Pain    HPI   Jerome Kennedy is a 48 y.o. male with a PMH of Crohn's disease s/p hemicolectomy, cholecystectomy, SBO, COPD, DVT, bipolar 1 disorder who presents for RLQ pain, N/V, and pruritus x 2 days.     Patient was recently hospitalized here from 05/25-05/29/25 for diarrhea, RLQ pain, N/V with concern for Crohn's flare. Symptoms felt to represent inadequate treatment with Rinvoq . He was discharged with place to continue PJP ppx with bactrim  until prednisone  taper decreased below 20 mg and encouraged patient to follow up with GI as outpatient.     Today patient reports he was feeling improved at time of discharge but soon after returning home had recurrence of N/V. Also developed sudden onset of sharp RLQ pain. Pain seems to be intermittent while N/V is persistent. Reports vomiting every few hours. States he has been unable to keep any of his medications down due to vomiting. Endorses diarrhea but states this is baseline for him. Last BM yesterday. Also endorses subjective fever, chills, malaise. Currently rates pain as 9/10. Requesting Prochlorperazine  and Benadryl  by name, in addition to pain medication.     Denies consumption of alcohol, nicotine -containing products, marijuana and other illicit drugs.     Review of Systems   Constitutional:  Positive for chills and malaise/fatigue.   Respiratory:  Negative for cough and shortness of breath.    Cardiovascular:  Negative for chest pain and palpitations.   Gastrointestinal:  Positive for abdominal pain, diarrhea, nausea and vomiting.     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    Myalgia     Rhinovirus infection 10/29/2022    SBO (small bowel obstruction)   2011       Past Surgical History:   Procedure Laterality Date    CHOLECYSTECTOMY      COLON SURGERY      partial resection    HERNIA REPAIR      PR ARTHRP INTERCARPAL/CARP/MTCRPL JT INTERPOSITION Left 08/07/2022    Procedure: INTERPOSIT ARTHROPLASTY-INTERCARPAL/CARPOMETACAR;  Surgeon: Adonna Hoots, MD;  Location: MAIN OR [facility];  Service: Orthopedics    PR CLOSE ENTEROSTOMY,RESEC+COLOREC ANAS Midline 11/01/2019    Procedure: CLO ENTEROSTOMY; W/RESECT Lethaniel Rave;  Surgeon: Eusebio High, MD;  Location: MAIN OR ;  Service: Gastrointestinal    PR COLONOSCOPY FLX DX W/COLLJ SPEC WHEN PFRMD Left 01/07/2013    Procedure: COLONOSCOPY, FLEXIBLE, PROXIMAL TO SPLENIC FLEXURE; DIAGNOSTIC, W/WO COLLECTION SPECIMEN BY BRUSH OR WASH;  Surgeon: Lizbeth Right, MD;  Location: GI PROCEDURES [facility];  Service: Gastroenterology    PR COLONOSCOPY FLX DX W/COLLJ SPEC WHEN PFRMD  03/09/2015    Procedure: COLONOSCOPY, FLEXIBLE, PROXIMAL TO SPLENIC FLEXURE; DIAGNOSTIC, W/WO COLLECTION SPECIMEN BY BRUSH OR WASH;  Surgeon: Delene Feinstein, MD;  Location: GI PROCEDURES [facility];  Service: Gastroenterology    PR COLONOSCOPY FLX DX W/COLLJ SPEC WHEN PFRMD N/A 10/28/2019    Procedure: COLONOSCOPY, FLEXIBLE, PROXIMAL TO SPLENIC FLEXURE; DIAGNOSTIC, W/WO COLLECTION SPECIMEN BY BRUSH OR WASH;  Surgeon: Sarajane Cumming, MD;  Location: GI PROCEDURES [facility];  Service: Gastroenterology    PR COLONOSCOPY FLX DX W/COLLJ SPEC WHEN PFRMD N/A 10/01/2022    Procedure: COLONOSCOPY, FLEXIBLE, PROXIMAL TO SPLENIC FLEXURE; DIAGNOSTIC, W/WO COLLECTION SPECIMEN BY BRUSH OR WASH;  Surgeon: Rana Burr, MD;  Location: GI PROCEDURES [facility];  Service: Gastroenterology    PR COLONOSCOPY FLX DX W/COLLJ SPEC WHEN PFRMD N/A 11/30/2023    Procedure: COLONOSCOPY, FLEXIBLE, PROXIMAL TO SPLENIC FLEXURE; DIAGNOSTIC, W/WO COLLECTION SPECIMEN BY BRUSH OR WASH;  Surgeon: Arlean Labs, MD;  Location: GI PROCEDURES [facility];  Service: Gastroenterology    PR EXPLORATORY OF ABDOMEN Midline 09/02/2019    Procedure: EXPLORATORY LAPAROTOMY, EXPLORATORY CELIOTOMY WITH OR WITHOUT BIOPSY(S);  Surgeon: Eusebio High, MD;  Location: MAIN OR [facility];  Service: Gastrointestinal    PR EXPLORATORY OF ABDOMEN Midline 11/01/2019    Procedure: EXPLORATORY LAPAROTOMY, EXPLORATORY CELIOTOMY WITH OR WITHOUT BIOPSY(S);  Surgeon: Eusebio High, MD;  Location: MAIN OR [facility];  Service: Gastrointestinal    PR FIX FINGER,VOLAR PLATE,I-P JT Left 08/07/2022    Procedure: REPAIR AND RECONSTRUCTION, FINGER, VOLAR PLATE, INTERPHALANGEAL JOINT;  Surgeon: Adonna Hoots, MD;  Location: MAIN OR [facility];  Service: Orthopedics    PR  FREEING BOWEL ADHESION,ENTEROLYSIS N/A 11/01/2019    Procedure: Enterolysis (Separt Proc);  Surgeon: Eusebio High, MD;  Location: MAIN OR [facility];  Service: Gastrointestinal    PR FUSION MC-P JT Left 08/27/2023    Procedure: ARTHRODESIS, METACARPOPHALANGEAL JOINT, WITH OR WITHOUT INTERNAL FIXATION;  Surgeon: Adonna Hoots, MD;  Location: OR [facility];  Service: Orthopedics    PR ILEOSCOPY THRU STOMA,BIOPSY N/A 10/14/2019    Procedure: Erby Hatcher; Lamarr Pilling 1/MX;  Surgeon: Loel Ring, MD;  Location: GI PROCEDURES [facility]ORIAL [facility];  Service: Gastroenterology    PR PART REMOVAL COLON W COLOSTOMY Midline 09/02/2019    Procedure: COLECTOMY, PARTIAL; WITH SKIN LEVEL CECOSTOMY OR COLOSTOMY;  Surgeon: Eusebio High, MD;  Location: MAIN OR [facility];  Service: Gastrointestinal    PR REPAIR INCISIONAL HERNIA,REDUCIBLE Midline 09/02/2019    Procedure: REPAIR INIT INCISIONAL OR VENTRAL HERNIA; REDUCIBLE;  Surgeon: Caro Christmas, MD;  Location: MAIN OR [place];  Service: Trauma    PR UPPER GI ENDOSCOPY,BIOPSY N/A 01/07/2013    Procedure: UGI ENDOSCOPY; WITH BIOPSY, SINGLE OR MULTIPLE;  Surgeon: Lizbeth Right, MD;  Location: GI PROCEDURES [facility];  Service: Gastroenterology    PR UPPER GI ENDOSCOPY,BIOPSY N/A 10/14/2019    Procedure: UGI ENDOSCOPY; WITH BIOPSY, SINGLE OR MULTIPLE;  Surgeon: Loel Ring, MD;  Location: GI PROCEDURES [facility];  Service: Gastroenterology    PR UPPER GI ENDOSCOPY,DIAGNOSIS N/A 10/01/2022    Procedure: UGI ENDO, INCLUDE ESOPHAGUS, STOMACH, & DUODENUM &/OR JEJUNUM; DX W/WO COLLECTION SPECIMN, BY BRUSH OR WASH;  Surgeon: Rana Burr, MD;  Location: GI PROCEDURES [facility]ORIAL [facility];  Service: Gastroenterology         Current Facility-Administered Medications:     diphenhydrAMINE  (BENADRYL ) injection 25 mg, 25 mg, Intravenous, Once, [place] Pulse, Sharma Dears, FNP    HYDROmorphone  (DILAUDID ) injection Syrg 0.5 mg, 0.5 mg, Intravenous, Once, Jacobb Alen, Sharma Dears, FNP    prochlorperazine  (COMPAZINE ) injection 10 mg, 10 mg, Intravenous, Once, Daysha Ashmore, Sharma Dears, FNP    Current Outpatient Medications:     cariprazine  (VRAYLAR ) 1.5 mg capsule, Take 1 capsule (1.5 mg total) by mouth daily., Disp: , Rfl:     cholecalciferol , vitamin D3 25 mcg, 1,000 units,, 1,000 unit (25 mcg) tablet, Take 1 tablet (25 mcg total) by mouth daily., Disp: 30 tablet, Rfl: 0    dicyclomine  (BENTYL ) 10 mg capsule, Take 1 capsule (10 mg total) by mouth four (4) times a day as needed., Disp: , Rfl:     empty container Misc, Use as directed, Disp: 1 each, Rfl: 3    ferrous sulfate  325 (65 FE) MG tablet, Take 1 tablet (325 mg total) by mouth in the morning., Disp: , Rfl:     FLONASE  ALLERGY RELIEF 50 mcg/actuation nasal spray, 2 sprays into each nostril daily as needed., Disp: , Rfl:     loperamide  (IMODIUM ) 2 mg capsule, Take 1 capsule (2 mg total) by mouth in the morning. Take 1 hour before Rinvoq ., Disp: 30 capsule, Rfl: 0    nicotine  (NICODERM CQ ) 21 mg/24 hr patch, Place 1 patch on the skin daily., Disp: 28 patch, Rfl: 0    nicotine  polacrilex (NICORETTE MINI) lozenge 2 mg, Apply 1 lozenge (2 mg total) to cheek every hour as needed for smoking cessation., Disp: 144 each, Rfl: 0 nicotine  polacrilex (NICORETTE) 4 MG gum, Apply 1 each (4 mg total) to cheek every hour as needed for smoking cessation., Disp: 110 each, Rfl: 0    ondansetron  (ZOFRAN -ODT) 4 MG disintegrating tablet, Dissolve  1 tablet (4 mg total) in the mouth every eight (8) hours as needed for nausea., Disp: 21 tablet, Rfl: 0    OPTICHAMBER DIAMOND [facility] Spcr, , Disp: , Rfl:     pantoprazole  (PROTONIX ) 40 MG tablet, Take 1 tablet (40 mg total) by mouth daily., Disp: 90 tablet, Rfl: 0    predniSONE  (DELTASONE ) 10 MG tablet, Take 4 tablets (40 mg total) by mouth daily for 12 days, THEN 3 tablets (30 mg total) daily for 7 days, THEN 2 tablets (20 mg total) daily for 7 days, THEN 1.5 tablets (15 mg total) daily for 7 days, THEN 1 tablet (10 mg total) daily for 7 days, THEN 0.5 tablets (5 mg total) daily for 7 days., Disp: 104 tablet, Rfl: 0    QUEtiapine  (SEROQUEL ) 100 MG tablet, Take 1 tablet (100 mg total) by mouth nightly., Disp: , Rfl:     sulfamethoxazole -trimethoprim  (BACTRIM  DS) 800-160 mg per tablet, Take 1 tablet (160 mg of trimethoprim  total) by mouth 3 (three) times a week., Disp: 12 tablet, Rfl: 0    SYMBICORT  80-4.5 mcg/actuation inhaler, Inhale 2 puffs  in the morning., Disp: 10.2 g, Rfl: 0    upadacitinib  (RINVOQ ) 45 mg Tb24, Take 1 tablet (45 mg total) by mouth daily., Disp: 84 tablet, Rfl: 0    VENTOLIN  HFA 90 mcg/actuation inhaler, Inhale 2 puffs every six (6) hours as needed., Disp: 18 g, Rfl: 0    Allergies  Morphine  and Toradol [ketorolac]    Family History   Problem Relation Age of Onset    Breast cancer Mother     Diabetes Maternal Aunt     Colon cancer Maternal Grandmother     Prostate cancer Maternal Grandfather     Cancer Paternal Grandmother     Crohn's disease Neg Hx        Social History     Tobacco Use    Smoking status: Every Day     Current packs/day: 1.00     Average packs/day: 0.6 packs/day for 22.3 years (12.7 ttl pk-yrs)     Types: Cigarettes     Start date: 10/02/2001     Passive exposure: Current Smokeless tobacco: Never    Tobacco comments:     1ppd   Vaping Use    Vaping status: Some Days    Substances: Nicotine     Passive vaping exposure: Yes   Substance Use Topics    Alcohol use: No     Alcohol/week: 0.0 standard drinks of alcohol    Drug use: Yes     Types: Marijuana     Comment: Smokes 2-3 joints/day          Physical Exam     ED Triage Vitals [01/03/24 1710]   Enc Vitals Group      BP 114/76      Pulse       SpO2 Pulse 87      Resp       Temp 37 ??C (98.6 ??F)      Temp Source Oral      SpO2 96 %      Weight 65.8 kg (145 lb)      Height       Head Circumference       Peak Flow       Pain Score       Pain Loc       Pain Education       Exclude from Growth Chart  Constitutional: Alert and oriented. Well appearing and in no acute distress. Resting comfortably in bed prior to visit.   Eyes: Conjunctivae are normal.  ENT       Head: Normocephalic and atraumatic.       Nose: No congestion.       Mouth/Throat: Mucous membranes are moist.  Respiratory: Normal respiratory effort.   Gastrointestinal: Soft and non-distended. Tender to palpation of RLQ without rebound or guarding. Negative Rovsing's sign.  Large ventral hernia, easily reducible.   Musculoskeletal: Normal range of motion in all extremities.       Right lower leg: No tenderness or edema.       Left lower leg: No tenderness or edema.  Neurologic: Normal speech and language. No gross focal neurologic deficits are appreciated.  Skin: Skin is warm, dry and intact. No rash noted.   Psychiatric: Mood and affect are normal. Speech and behavior are normal.     Radiology     CT Abdomen Pelvis W Contrast    (Results Pending)           Note initiated by: Vivienne Grove PA-S2         Darlena Ego, FNP  01/04/24 1605

## 2024-01-03 NOTE — Unmapped (Signed)
 [name] with report of RLQ abdominal pain, N/V for about one week. Hx of multiple abdominal surgeries. Given PO zofran  and IM fentanyl  en route.

## 2024-01-04 MED ADMIN — iohexol (OMNIPAQUE) 350 mg iodine/mL solution 100 mL: 100 mL | INTRAVENOUS | @ 03:00:00 | Stop: 2024-01-03

## 2024-01-04 MED ADMIN — acetaminophen (OFIRMEV) 10 mg/mL injection 1,000 mg: 1000 mg | INTRAVENOUS | @ 03:00:00 | Stop: 2024-01-03

## 2024-01-04 MED ADMIN — oxyCODONE (ROXICODONE) immediate release tablet 5 mg: 5 mg | ORAL | @ 05:00:00 | Stop: 2024-01-04

## 2024-01-04 MED ADMIN — HYDROmorphone (DILAUDID) injection Syrg 0.5 mg: 0.5 mg | INTRAVENOUS | @ 01:00:00 | Stop: 2024-01-03

## 2024-01-04 MED ADMIN — prochlorperazine (COMPAZINE) injection 10 mg: 10 mg | INTRAVENOUS | @ 01:00:00 | Stop: 2024-01-03

## 2024-01-04 MED ADMIN — diphenhydrAMINE (BENADRYL) injection 25 mg: 25 mg | INTRAVENOUS | @ 01:00:00 | Stop: 2024-01-03

## 2024-01-07 NOTE — Unmapped (Addendum)
 Approval in place. Coordinate infusion on 6/12, same day as next follow up with Dr. Merlyn Starring.  Patient instructed to arrive at [facility] at 1245 for infusion first, appt to follow. Patient verbalized understanding and in agreement with plan.  Added to complete pro for nurse support.

## 2024-01-14 ENCOUNTER — Encounter: Admit: 2024-01-14 | Discharge: 2024-01-14 | Payer: Medicaid (Managed Care)

## 2024-01-14 ENCOUNTER — Ambulatory Visit: Admit: 2024-01-14 | Discharge: 2024-01-14 | Payer: Medicaid (Managed Care)

## 2024-01-14 DIAGNOSIS — K50018 Crohn's disease of small intestine with other complication: Principal | ICD-10-CM

## 2024-01-14 MED ORDER — TREMFYA PEN INDUCTION PACK-CROHN 200 MG/2 ML SUBCUTANEOUS PEN INJECTOR
SUBCUTANEOUS | 2 refills | 0.00000 days | Status: CP
Start: 2024-01-14 — End: ?

## 2024-01-14 MED ORDER — TREMFYA PEN 200 MG/2 ML SUBCUTANEOUS PEN INJECTOR
SUBCUTANEOUS | 5 refills | 0.00000 days | Status: CP
Start: 2024-01-14 — End: ?
  Filled 2024-01-22: qty 4, 28d supply, fill #0

## 2024-01-14 NOTE — Unmapped (Signed)
 Jerome Kennedy is here for  infusion skyrizi first loading dose.He is alert and oriented x 4 denies pain Per patient no new medication or any  infection recently,Education provided about the Medication and hand out given.He keep saying I am very difficult stick you cannot able to get Iv unless using the ultra sound ,This Rn educated about the  important of hydration to get a easy Piv ,using the heat application and Light getting ready to start piv he requested to some one do his pIv Team leader Bernette Brim notified ,She tried and Multiple Infusion Rn tried place his piv unfortunately could not able ot succeed.Provider notified.He has to talk to his provider about getting infusion in the hospital

## 2024-01-14 NOTE — Unmapped (Signed)
 MEDICATION ORDER ENTRY DOCUMENTATION    The following medication(s) have been sent to pharmacy for insurance authorization:    Medication: guselkumab   Dose & Frequency: 400 mg SQ at weeks 0, 4, and 8; followed by 200 mg SQ every 4 weeks  Pharmacy: [facility]       Requesting Physician: Dr. Tonie Franks    Notes:      Madelene Schanz, PharmD, BCPS, CPP  GI/IBD - Clinical Pharmacist Practitioner

## 2024-01-14 NOTE — Unmapped (Signed)
 We will submit Tremfya  for approval by your insurance.   Return to clinic 3 months to see [facility].

## 2024-01-15 DIAGNOSIS — K50018 Crohn's disease of small intestine with other complication: Principal | ICD-10-CM

## 2024-01-15 NOTE — Unmapped (Signed)
 BIB EMS, c/o RLQ abd pain, N/V 2x today, diarrhea 4x, k/c with Chrohns dse, seen in ED last week with same complaints, wasn't able to start the medication that GI doctor prescribed for him yesterday, no fever at home

## 2024-01-15 NOTE — Unmapped (Signed)
 Pt here with EMS c/o abd pain   Pt on NRB since reported to drop O2 to 80s  Will sent for EKG

## 2024-01-16 ENCOUNTER — Emergency Department
Admit: 2024-01-16 | Discharge: 2024-01-16 | Disposition: A | Discharge status: Home / Self Care | Payer: Medicaid (Managed Care) | Attending: Student in an Organized Health Care Education/Training Program

## 2024-01-16 LAB — COMPREHENSIVE METABOLIC PANEL
ALBUMIN: 3 g/dL — ABNORMAL LOW (ref 3.4–5.0)
ALKALINE PHOSPHATASE: 143 U/L — ABNORMAL HIGH (ref 46–116)
ALT (SGPT): 66 U/L — ABNORMAL HIGH (ref 10–49)
ANION GAP: 11 mmol/L (ref 5–14)
AST (SGOT): 59 U/L — ABNORMAL HIGH (ref <=34)
BILIRUBIN TOTAL: 0.3 mg/dL (ref 0.3–1.2)
BLOOD UREA NITROGEN: 12 mg/dL (ref 9–23)
BUN / CREAT RATIO: 13
CALCIUM: 8.3 mg/dL — ABNORMAL LOW (ref 8.7–10.4)
CHLORIDE: 113 mmol/L — ABNORMAL HIGH (ref 98–107)
CO2: 21 mmol/L (ref 20.0–31.0)
CREATININE: 0.96 mg/dL (ref 0.73–1.18)
EGFR CKD-EPI (2021) MALE: >90 mL/min/1.73m2 (ref >=60)
GLUCOSE RANDOM: 124 mg/dL (ref 70–179)
POTASSIUM: 3.2 mmol/L — ABNORMAL LOW (ref 3.4–4.8)
PROTEIN TOTAL: 5.8 g/dL (ref 5.7–8.2)
SODIUM: 145 mmol/L (ref 135–145)

## 2024-01-16 LAB — URINALYSIS WITH MICROSCOPY WITH CULTURE REFLEX PERFORMABLE
BILIRUBIN UA: NEGATIVE
BLOOD UA: NEGATIVE
GLUCOSE UA: NEGATIVE
KETONES UA: NEGATIVE
NITRITE UA: NEGATIVE
PH UA: 6 (ref 5.0–9.0)
RBC UA: 5 /HPF — ABNORMAL HIGH (ref <=3)
SPECIFIC GRAVITY UA: 1.026 (ref 1.003–1.030)
SQUAMOUS EPITHELIAL: 2 /HPF (ref 0–5)
TRANSITIONAL EPITHELIAL: <1 /HPF (ref 0–2)
UROBILINOGEN UA: <2
WBC UA: 25 /HPF — ABNORMAL HIGH (ref <=2)

## 2024-01-16 LAB — CBC W/ AUTO DIFF
BASOPHILS ABSOLUTE COUNT: 0 10*9/L (ref 0.0–0.1)
BASOPHILS RELATIVE PERCENT: 0.5 %
EOSINOPHILS ABSOLUTE COUNT: 0.1 10*9/L (ref 0.0–0.5)
EOSINOPHILS RELATIVE PERCENT: 1.1 %
HEMATOCRIT: 35.3 % — ABNORMAL LOW (ref 39.0–48.0)
HEMOGLOBIN: 12.1 g/dL — ABNORMAL LOW (ref 12.9–16.5)
LYMPHOCYTES ABSOLUTE COUNT: 1.3 10*9/L (ref 1.1–3.6)
LYMPHOCYTES RELATIVE PERCENT: 21.7 %
MEAN CORPUSCULAR HEMOGLOBIN CONC: 34.3 g/dL (ref 32.0–36.0)
MEAN CORPUSCULAR HEMOGLOBIN: 29 pg (ref 25.9–32.4)
MEAN CORPUSCULAR VOLUME: 84.6 fL (ref 77.6–95.7)
MEAN PLATELET VOLUME: 7.9 fL (ref 6.8–10.7)
MONOCYTES ABSOLUTE COUNT: 0.5 10*9/L (ref 0.3–0.8)
MONOCYTES RELATIVE PERCENT: 9 %
NEUTROPHILS ABSOLUTE COUNT: 4 10*9/L (ref 1.8–7.8)
NEUTROPHILS RELATIVE PERCENT: 67.7 %
PLATELET COUNT: 164 10*9/L (ref 150–450)
RED BLOOD CELL COUNT: 4.18 10*12/L — ABNORMAL LOW (ref 4.26–5.60)
RED CELL DISTRIBUTION WIDTH: 18.4 % — ABNORMAL HIGH (ref 12.2–15.2)
WBC ADJUSTED: 5.9 10*9/L (ref 3.6–11.2)

## 2024-01-16 LAB — SEDIMENTATION RATE: ERYTHROCYTE SEDIMENTATION RATE: 4 mm/h (ref 0–15)

## 2024-01-16 LAB — TOXICOLOGY SCREEN, URINE
AMPHETAMINE SCREEN URINE: NEGATIVE
BARBITURATE SCREEN URINE: NEGATIVE
BENZODIAZEPINE SCREEN, URINE: NEGATIVE
BUPRENORPHINE, URINE SCREEN: NEGATIVE
CANNABINOID SCREEN URINE: POSITIVE — AB
COCAINE(METAB.)SCREEN, URINE: POSITIVE — AB
FENTANYL SCREEN, URINE: NEGATIVE
METHADONE SCREEN, URINE: NEGATIVE
OPIATE SCREEN URINE: NEGATIVE
OXYCODONE SCREEN URINE: NEGATIVE

## 2024-01-16 LAB — MAGNESIUM: MAGNESIUM: 1.2 mg/dL — ABNORMAL LOW (ref 1.6–2.6)

## 2024-01-16 LAB — ETHANOL: ETHANOL: <10 mg/dL (ref <=10)

## 2024-01-16 LAB — LIPASE: LIPASE: 32 U/L (ref 12–53)

## 2024-01-16 LAB — C-REACTIVE PROTEIN: C-REACTIVE PROTEIN: 8.5 mg/L (ref <=10.0)

## 2024-01-16 MED ADMIN — diphenhydrAMINE (BENADRYL) injection 25 mg: 25 mg | INTRAVENOUS | @ 08:00:00 | Stop: 2024-01-16

## 2024-01-16 MED ADMIN — diphenhydrAMINE (BENADRYL) capsule/tablet 25 mg: 25 mg | ORAL | @ 11:00:00 | Stop: 2024-01-16

## 2024-01-16 MED ADMIN — lactated ringers bolus 1,000 mL: 1000 mL | INTRAVENOUS | @ 07:00:00 | Stop: 2024-01-16

## 2024-01-16 MED ADMIN — magnesium oxide (MAG-OX) tablet 400 mg: 400 mg | ORAL | @ 08:00:00 | Stop: 2024-01-16

## 2024-01-16 MED ADMIN — HYDROmorphone (PF) (DILAUDID) injection 1 mg: 1 mg | INTRAVENOUS | @ 07:00:00 | Stop: 2024-01-16

## 2024-01-16 MED ADMIN — prochlorperazine (COMPAZINE) injection 10 mg: 10 mg | INTRAVENOUS | @ 07:00:00 | Stop: 2024-01-16

## 2024-01-16 MED ADMIN — HYDROmorphone (PF) (DILAUDID) injection 1 mg: 1 mg | INTRAVENOUS | @ 09:00:00 | Stop: 2024-01-16

## 2024-01-16 NOTE — Unmapped (Signed)
 [facility]  Emergency Department Provider Note     HPI     History of Present Illness  Jerome Kennedy. is a 48 year old male with a history of Crohn's disease, SBO, COPD, chronic steroid use on PJP prophylaxis with Bactrim , who presents with abdominal pain and pruritis.    He has abdominal pain located in the right lower abdomen that started today. The pain is severe enough to prevent him from eating since last night.    He has a history of Crohn's disease and has been seen multiple times recently for this issue. He has not had access to a new medication that his gastroenterologist was trying to set up for him, which may be contributing to the current flare-up of symptoms.  Per chart review the patient was seen by gastroenterology on 01/14/2024 and they submitted insurance approval for Tremfya .  It appears the patient has not amiable to fill this medication.    Patient denies blood in stool, changes in urine, fevers, new skin rashes, chest pain, or shortness of breath.  Last CT scan was obtained on 01/03/2024 and demonstrated decreased wall thickening of the neoterminal ileum compared to prior CT from 12/27/2023.       Physical Exam     BP 111/72  - Pulse 97  - Temp 36.7 ??C (98.1 ??F) (Oral)  - Resp 18  - Wt 68.9 kg (152 lb)  - SpO2 99%  - BMI 23.12 kg/m??      Constitutional: In no acute distress.  Eyes: Conjunctivae are normal. EOMI.   HEENT: Normocephalic and atraumatic. Mucous membranes are moist.   Neck: Full active range of motion  Cardiovascular: See heart rate listed above.  No murmurs appreciated.  Normal skin perfusion.   Respiratory: See respiratory rate listed above.  Lungs are clear to auscultation bilaterally.  Speaking easily in full sentences  Gastrointestinal: Chronic appearing reducible ventral hernia with diffuse tenderness throughout the abdomen to palpation. No guarding or peritonitis.   Musculoskeletal: No long bone deformities.   Neurologic: Normal speech and language. No gross focal neurologic deficits are appreciated.   Skin: Skin is warm, dry and intact.  Psychiatric: Mood and affect are normal.      MDM     The patient presents with abdominal pain and nausea in the setting of Crohn's disease. The patient was recently prescribed Tremfya  but has not yet obtained access to the medication. Suspect abdominal pain secondary to patient's underlying Crohn's but differential also includes viral gastroenteritis, foodborne illness, peptic ulcer disease, GERD, pancreatitis, urinary tract infection, among other etiologies.  Lower suspicion for small bowel obstruction or incarcerated hernia as the patient's ventral hernias currently reducible and not exquisitely tender.  We will obtain the diagnostic workup noted below, including assessment of inflammatory markers.  If the laboratory assessment is concerning for evidence of a significant flare of Crohn's disease or other intra-abdominal pathology we will consider CT abdomen pelvis with contrast to better characterize the pathology.  Initial symptomatic management with Dilaudid  and Compazine .  If the patient's pain improves with symptomatic management we will provide p.o. challenge.  Final disposition per pending diagnostics and reassessment. Please see ED course below for additional updates.    Orders Placed This Encounter   Procedures    Urine Culture    CBC w/ Differential    Comprehensive Metabolic Panel    Lipase Level    Urinalysis with Microscopy with Culture Reflex    C-reactive protein    Sedimentation  rate, manual    Magnesium     Ethanol,Blood    Toxicology screen, urine    PO Challenge    ECG 12 Lead       ED Course as of 01/16/24 0750   Sat Jan 16, 2024   0259 CBC w/ Differential(!):    WBC 5.9   RBC 4.18(!)   HGB 12.1(!)   HCT 35.3(!)   MCV 84.6   MCH 29.0   MCHC 34.3   RDW 18.4(!)   MPV 7.9   Platelet 164   Neutrophils % 67.7   Lymphocytes % 21.7   Monocytes % 9.0   Eosinophils % 1.1   Basophils % 0.5   Absolute Neutrophils 4.0   Absolute Lymphocytes 1.3   Absolute Monocytes  0.5   Absolute Eosinophils 0.1   Absolute Basophils  0.0   Anisocytosis Slight(!)   0259 Comprehensive Metabolic Panel(!):    Sodium 145   Potassium 3.2(!)   Chloride 113(!)   CO2 21.0   Anion Gap 11   Bun 12   Creatinine 0.96   BUN/Creatinine Ratio 13   eGFR CKD-EPI (2021) Male >90   Glucose 124   Calcium  8.3(!)   Albumin 3.0(!)   Total Protein 5.8   Total Bilirubin 0.3   SGOT (AST) 59(!)   ALT 66(!)   Alkaline Phosphatase 143(!)   0259 Lipase Level:    Lipase 32   0259 Sedimentation rate, manual:    Sed Rate 4   0259 C-reactive protein:    CRP 8.5   0259 ECG 12 Lead  Sinus tachycardia with premature atrial beats, rate 113, normal axis, normal intervals, no ST changes, no T wave abnormalities.  Question developing LVH.   0335 Magnesium (!):    Magnesium  1.2(!)   0453 Ethanol,Blood:    Alcohol, Ethyl <10   0526 Patient reports his pain is improving after second round of analgesic medicines.  We discussed the importance of following up to obtain the medication prescribed by his gastroenterologist.  We will complete urinalysis testing and provide a p.o. challenge prior to reassessment and disposition.   9345 Urinalysis with Microscopy with Culture Reflex(!):    Color, UA Yellow   Clarity, UA Clear   Spec Grav, UA 1.026   pH, UA 6.0   Leukocyte Esterase, UA Moderate(!)   Nitrite, UA Negative   Protein, UA Trace(!)   Glucose, UA Negative   Ketones, UA Negative   Urobilinogen, UA <2.0 mg/dL   Bilirubin, UA Negative   Blood, UA Negative   RBC, UA 5(!)   WBC, UA 25(!)   Squam Epithel, UA 2   Bacteria, UA Few(!)   Trans Epithel, UA <1   Mucus, UA Occasional(!)  The patient recently began treatment with prophylactic Bactrim  3 times weekly.  He denies dysuria at this time.  We will culture results and treat as indicated if they indicate a concern for infection.   9344 Patient tolerated p.o. challenge.  He notes his pain is resolved.  I have encouraged him to follow-up with his gastroenterology team to begin taking the medications they have prescribed.  I also encouraged him to follow-up with his primary doctor.  Supportive care and return precautions provided.       The case was discussed with the attending physician who is in agreement with the above assessment and plan.    - Any discussion of this patient's case/presentation between myself and consultants, admitting teams, or other team members has been documented above.  -  Imaging and other studies, if performed, that were available during my care of the patient were independently reviewed and interpreted by me and considered in my medical decision making as documented above.  - External records reviewed: As noted above  - Consideration of admission, observation, transfer, or escalation of care: None     ED Clinical Impression     Final diagnoses:   Generalized abdominal pain (Primary)   Crohn's disease with complication, unspecified gastrointestinal tract location      Pruritus        Past History     PAST MEDICAL HISTORY/PAST SURGICAL HISTORY:   Past Medical History[1]    Past Surgical History[2]    MEDICATIONS:   No current facility-administered medications for this encounter.    Current Outpatient Medications:     cariprazine  (VRAYLAR ) 1.5 mg capsule, Take 1 capsule (1.5 mg total) by mouth daily., Disp: , Rfl:     cholecalciferol , vitamin D3 25 mcg, 1,000 units,, 1,000 unit (25 mcg) tablet, Take 1 tablet (25 mcg total) by mouth daily., Disp: 30 tablet, Rfl: 0    dicyclomine  (BENTYL ) 10 mg capsule, Take 1 capsule (10 mg total) by mouth four (4) times a day as needed., Disp: , Rfl:     empty container Misc, Use as directed, Disp: 1 each, Rfl: 3    ferrous sulfate  325 (65 FE) MG tablet, Take 1 tablet (325 mg total) by mouth in the morning., Disp: , Rfl:     FLONASE  ALLERGY RELIEF 50 mcg/actuation nasal spray, 2 sprays into each nostril daily as needed., Disp: , Rfl:     guselkumab  (TREMFYA  PEN INDUCTION PK-CROHN) 200 mg/2 mL PnIj, Inject the contents of 2 pens (400 mg) under the skin every twenty-eight (28) days., Disp: 4 mL, Rfl: 2    guselkumab  (TREMFYA  PEN) 200 mg/2 mL PnIj, Inject the contents of 1 pen (200 mg) under the skin every twenty-eight (28) days., Disp: 2 mL, Rfl: 5    loperamide  (IMODIUM ) 2 mg capsule, Take 1 capsule (2 mg total) by mouth in the morning. Take 1 hour before Rinvoq ., Disp: 30 capsule, Rfl: 0    nicotine  (NICODERM CQ ) 21 mg/24 hr patch, Place 1 patch on the skin daily., Disp: 28 patch, Rfl: 0    nicotine  polacrilex (NICORETTE MINI) lozenge 2 mg, Apply 1 lozenge (2 mg total) to cheek every hour as needed for smoking cessation., Disp: 144 each, Rfl: 0    nicotine  polacrilex (NICORETTE) 4 MG gum, Apply 1 each (4 mg total) to cheek every hour as needed for smoking cessation., Disp: 110 each, Rfl: 0    ondansetron  (ZOFRAN -ODT) 4 MG disintegrating tablet, Dissolve 1 tablet (4 mg total) in the mouth every eight (8) hours as needed for nausea., Disp: 21 tablet, Rfl: 0    OPTICHAMBER DIAMOND VHC Spcr, , Disp: , Rfl:     pantoprazole  (PROTONIX ) 40 MG tablet, Take 1 tablet (40 mg total) by mouth daily., Disp: 90 tablet, Rfl: 0    predniSONE  (DELTASONE ) 10 MG tablet, Take 4 tablets (40 mg total) by mouth daily for 12 days, THEN 3 tablets (30 mg total) daily for 7 days, THEN 2 tablets (20 mg total) daily for 7 days, THEN 1.5 tablets (15 mg total) daily for 7 days, THEN 1 tablet (10 mg total) daily for 7 days, THEN 0.5 tablets (5 mg total) daily for 7 days., Disp: 104 tablet, Rfl: 0    QUEtiapine  (SEROQUEL ) 100 MG tablet, Take 1 tablet (100 mg total)  by mouth nightly., Disp: , Rfl:     sulfamethoxazole -trimethoprim  (BACTRIM  DS) 800-160 mg per tablet, Take 1 tablet (160 mg of trimethoprim  total) by mouth 3 (three) times a week., Disp: 12 tablet, Rfl: 0    SYMBICORT  80-4.5 mcg/actuation inhaler, Inhale 2 puffs  in the morning., Disp: 10.2 g, Rfl: 0    upadacitinib  (RINVOQ ) 45 mg Tb24, Take 1 tablet (45 mg total) by mouth daily., Disp: 84 tablet, Rfl: 0 VENTOLIN  HFA 90 mcg/actuation inhaler, Inhale 2 puffs every six (6) hours as needed., Disp: 18 g, Rfl: 0    ALLERGIES:   Morphine  and Toradol [ketorolac]    SOCIAL HISTORY:   Social History     Tobacco Use    Smoking status: Every Day     Current packs/day: 1.00     Average packs/day: 0.6 packs/day for 22.3 years (12.7 ttl pk-yrs)     Types: Cigarettes     Start date: 10/02/2001     Passive exposure: Current    Smokeless tobacco: Never    Tobacco comments:     1ppd   Substance Use Topics    Alcohol use: No     Alcohol/week: 0.0 standard drinks of alcohol       FAMILY HISTORY:  Family History[3]     Vitals     Vitals:    01/15/24 2252 01/15/24 2309 01/16/24 0658   BP: 108/68 106/60 111/72   Pulse:  89 97   Resp:  18    Temp:  36.7 ??C (98.1 ??F) 36.7 ??C (98.1 ??F)   TempSrc:  Oral Oral   SpO2: 98% 99% 99%   Weight:  68.9 kg (152 lb)          Radiology     No orders to display        Laboratory Data     Lab Results   Component Value Date    WBC 5.9 01/16/2024    HGB 12.1 (L) 01/16/2024    HCT 35.3 (L) 01/16/2024    PLT 164 01/16/2024       Lab Results   Component Value Date    NA 145 01/16/2024    K 3.2 (L) 01/16/2024    CL 113 (H) 01/16/2024    CO2 21.0 01/16/2024    BUN 12 01/16/2024    CREATININE 0.96 01/16/2024    GLU 124 01/16/2024    CALCIUM  8.3 (L) 01/16/2024    MG 1.2 (L) 01/16/2024    PHOS 2.2 (L) 12/02/2023       Lab Results   Component Value Date    BILITOT 0.3 01/16/2024    BILIDIR 0.20 04/26/2023    PROT 5.8 01/16/2024    ALBUMIN 3.0 (L) 01/16/2024    ALT 66 (H) 01/16/2024    AST 59 (H) 01/16/2024    ALKPHOS 143 (H) 01/16/2024    GGT 57 01/05/2013       Lab Results   Component Value Date    LABPROT 12.0 01/08/2013    INR 0.99 10/18/2023    APTT 22.4 (L) 10/18/2023       Portions of this record have been created using [employer]. Dictation errors have been sought, but may not have been identified and corrected.           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    Myalgia     Rhinovirus infection 10/29/2022    SBO (small bowel obstruction)    2011   [2]   Past Surgical History:  Procedure Laterality Date    CHOLECYSTECTOMY      COLON SURGERY      partial resection    HERNIA REPAIR      PR ARTHRP INTERCARPAL/CARP/MTCRPL JT INTERPOSITION Left 08/07/2022    Procedure: INTERPOSIT ARTHROPLASTY-INTERCARPAL/CARPOMETACAR;  Surgeon: Bonifacio Cordella HERO, MD;  Location: MAIN OR [facility];  Service: Orthopedics    PR CLOSE ENTEROSTOMY,RESEC+COLOREC ANAS Midline 11/01/2019    Procedure: CLO ENTEROSTOMY; W/RESECT AUGIE FLAKES;  Surgeon: Aloysius Euell Quale, MD;  Location: MAIN OR [place];  Service: Gastrointestinal    PR COLONOSCOPY FLX DX W/COLLJ SPEC WHEN PFRMD Left 01/07/2013    Procedure: COLONOSCOPY, FLEXIBLE, PROXIMAL TO SPLENIC FLEXURE; DIAGNOSTIC, W/WO COLLECTION SPECIMEN BY BRUSH OR WASH;  Surgeon: Liliane JENEANE Marker, MD;  Location: [facility];  Service: Gastroenterology    PR COLONOSCOPY FLX DX W/COLLJ SPEC WHEN PFRMD  03/09/2015    Procedure: COLONOSCOPY, FLEXIBLE, PROXIMAL TO SPLENIC FLEXURE; DIAGNOSTIC, W/WO COLLECTION SPECIMEN BY BRUSH OR WASH;  Surgeon: Artist Jayson Leather, MD;  Location: [facility]URES [facility];  Service: Gastroenterology    PR COLONOSCOPY FLX DX W/COLLJ SPEC WHEN PFRMD N/A 10/28/2019    Procedure: COLONOSCOPY, FLEXIBLE, PROXIMAL TO SPLENIC FLEXURE; DIAGNOSTIC, W/WO COLLECTION SPECIMEN BY BRUSH OR WASH;  Surgeon: Derick Bring, MD;  Location: [facility]URES [facility];  Service: Gastroenterology    PR COLONOSCOPY FLX DX W/COLLJ SPEC WHEN PFRMD N/A 10/01/2022    Procedure: COLONOSCOPY, FLEXIBLE, PROXIMAL TO SPLENIC FLEXURE; DIAGNOSTIC, W/WO COLLECTION SPECIMEN BY BRUSH OR WASH;  Surgeon: Darrick Peck, MD;  Location: [facility];  Service: Gastroenterology    PR COLONOSCOPY FLX DX W/COLLJ SPEC WHEN PFRMD N/A 11/30/2023    Procedure: COLONOSCOPY, FLEXIBLE, PROXIMAL TO SPLENIC FLEXURE; DIAGNOSTIC, W/WO COLLECTION SPECIMEN BY BRUSH OR WASH;  Surgeon: Audrey Lacinda Hun, MD;  Location: [facility];  Service: Gastroenterology    PR EXPLORATORY OF ABDOMEN Midline 09/02/2019    Procedure: EXPLORATORY LAPAROTOMY, EXPLORATORY CELIOTOMY WITH OR WITHOUT BIOPSY(S);  Surgeon: Aloysius Euell Quale, MD;  Location: MAIN OR [facility];  Service: Gastrointestinal    PR EXPLORATORY OF ABDOMEN Midline 11/01/2019    Procedure: EXPLORATORY LAPAROTOMY, EXPLORATORY CELIOTOMY WITH OR WITHOUT BIOPSY(S);  Surgeon: Aloysius Euell Quale, MD;  Location: MAIN OR [facility];  Service: Gastrointestinal    PR FIX FINGER,VOLAR PLATE,I-P JT Left 08/07/2022    Procedure: REPAIR AND RECONSTRUCTION, FINGER, VOLAR PLATE, INTERPHALANGEAL JOINT;  Surgeon: Bonifacio Cordella HERO, MD;  Location: MAIN OR [facility];  Service: Orthopedics    PR FREEING BOWEL ADHESION,ENTEROLYSIS N/A 11/01/2019    Procedure: Enterolysis (Separt Proc);  Surgeon: Aloysius Euell Quale, MD;  Location: MAIN OR [facility];  Service: Gastrointestinal    PR FUSION MC-P JT Left 08/27/2023    Procedure: ARTHRODESIS, METACARPOPHALANGEAL JOINT, WITH OR WITHOUT INTERNAL FIXATION;  Surgeon: Bonifacio Cordella HERO, MD;  Location: OR [facility];  Service: Orthopedics    PR ILEOSCOPY THRU STOMA,BIOPSY  N/A 10/14/2019    Procedure: LYNETT; LORN 1/MX;  Surgeon: Thedora Alm Plain, MD;  Location: [facility];  Service: Gastroenterology    PR PART REMOVAL COLON W COLOSTOMY Midline 09/02/2019    Procedure: COLECTOMY, PARTIAL; WITH SKIN LEVEL CECOSTOMY OR COLOSTOMY;  Surgeon: Aloysius Euell Quale, MD;  Location: MAIN OR [facility];  Service: Gastrointestinal    PR REPAIR INCISIONAL HERNIA,REDUCIBLE Midline 09/02/2019    Procedure: REPAIR INIT INCISIONAL OR VENTRAL HERNIA; REDUCIBLE;  Surgeon: Arnette Darice Burnet, MD;  Location: MAIN OR [place];  Service: Trauma    PR UPPER GI ENDOSCOPY,BIOPSY N/A 01/07/2013    Procedure: UGI ENDOSCOPY; WITH BIOPSY, SINGLE OR MULTIPLE;  Surgeon: Liliane JENEANE Marker, MD;  Location: [facility];  Service: Gastroenterology    PR UPPER GI ENDOSCOPY,BIOPSY N/A 10/14/2019    Procedure: UGI ENDOSCOPY; WITH BIOPSY, SINGLE OR MULTIPLE;  Surgeon: Thedora Alm Plain, MD;  Location: [facility]URES [facility];  Service: Gastroenterology    PR UPPER GI ENDOSCOPY,DIAGNOSIS N/A 10/01/2022    Procedure: UGI ENDO, INCLUDE ESOPHAGUS, STOMACH, & DUODENUM &/OR JEJUNUM; DX W/WO COLLECTION SPECIMN, BY BRUSH OR WASH;  Surgeon: Darrick Peck, MD;  Location: [facility];  Service: Gastroenterology   [3]   Family History  Problem Relation Age of Onset    Breast cancer Mother     Diabetes Maternal Aunt     Colon cancer Maternal Grandmother     Prostate cancer Maternal Grandfather     Cancer Paternal Grandmother     Crohn's disease Neg Hx         Carlo Norleen LABOR, MD  Resident  01/16/24 [phone]

## 2024-01-19 ENCOUNTER — Emergency Department
Admit: 2024-01-19 | Discharge: 2024-01-20 | Disposition: A | Discharge status: Home / Self Care | Payer: Medicaid (Managed Care) | Attending: Emergency Medicine

## 2024-01-19 LAB — CBC W/ AUTO DIFF
BASOPHILS ABSOLUTE COUNT: 0 10*9/L (ref 0.0–0.1)
BASOPHILS RELATIVE PERCENT: 0.3 %
EOSINOPHILS ABSOLUTE COUNT: 0 10*9/L (ref 0.0–0.5)
EOSINOPHILS RELATIVE PERCENT: 0.4 %
HEMATOCRIT: 40.1 % (ref 39.0–48.0)
HEMOGLOBIN: 13.5 g/dL (ref 12.9–16.5)
LYMPHOCYTES ABSOLUTE COUNT: 2.4 10*9/L (ref 1.1–3.6)
LYMPHOCYTES RELATIVE PERCENT: 47.4 %
MEAN CORPUSCULAR HEMOGLOBIN CONC: 33.7 g/dL (ref 32.0–36.0)
MEAN CORPUSCULAR HEMOGLOBIN: 28.2 pg (ref 25.9–32.4)
MEAN CORPUSCULAR VOLUME: 83.7 fL (ref 77.6–95.7)
MEAN PLATELET VOLUME: 7.8 fL (ref 6.8–10.7)
MONOCYTES ABSOLUTE COUNT: 0.4 10*9/L (ref 0.3–0.8)
MONOCYTES RELATIVE PERCENT: 8.3 %
NEUTROPHILS ABSOLUTE COUNT: 2.2 10*9/L (ref 1.8–7.8)
NEUTROPHILS RELATIVE PERCENT: 43.6 %
PLATELET COUNT: 231 10*9/L (ref 150–450)
RED BLOOD CELL COUNT: 4.79 10*12/L (ref 4.26–5.60)
RED CELL DISTRIBUTION WIDTH: 18.8 % — ABNORMAL HIGH (ref 12.2–15.2)
WBC ADJUSTED: 5.1 10*9/L (ref 3.6–11.2)

## 2024-01-19 LAB — URINALYSIS WITH MICROSCOPY WITH CULTURE REFLEX PERFORMABLE
BACTERIA: NONE SEEN /HPF
BILIRUBIN UA: NEGATIVE
BLOOD UA: NEGATIVE
GLUCOSE UA: NEGATIVE
KETONES UA: NEGATIVE
NITRITE UA: NEGATIVE
PH UA: 6.5 (ref 5.0–9.0)
RBC UA: 2 /HPF (ref <=3)
SPECIFIC GRAVITY UA: 1.032 — ABNORMAL HIGH (ref 1.003–1.030)
SQUAMOUS EPITHELIAL: 3 /HPF (ref 0–5)
UROBILINOGEN UA: <2
WBC UA: 11 /HPF — ABNORMAL HIGH (ref <=2)

## 2024-01-19 LAB — LIPASE: LIPASE: 38 U/L (ref 12–53)

## 2024-01-19 LAB — COMPREHENSIVE METABOLIC PANEL
ALBUMIN: 3.7 g/dL (ref 3.4–5.0)
ALKALINE PHOSPHATASE: 163 U/L — ABNORMAL HIGH (ref 46–116)
ALT (SGPT): 36 U/L (ref 10–49)
ANION GAP: 11 mmol/L (ref 5–14)
AST (SGOT): 22 U/L (ref <=34)
BILIRUBIN TOTAL: 0.5 mg/dL (ref 0.3–1.2)
BLOOD UREA NITROGEN: 12 mg/dL (ref 9–23)
BUN / CREAT RATIO: 11
CALCIUM: 8.6 mg/dL — ABNORMAL LOW (ref 8.7–10.4)
CHLORIDE: 110 mmol/L — ABNORMAL HIGH (ref 98–107)
CO2: 23 mmol/L (ref 20.0–31.0)
CREATININE: 1.08 mg/dL (ref 0.73–1.18)
EGFR CKD-EPI (2021) MALE: 85 mL/min/1.73m2 (ref >=60)
GLUCOSE RANDOM: 88 mg/dL (ref 70–179)
POTASSIUM: 3.4 mmol/L (ref 3.4–4.8)
PROTEIN TOTAL: 6.9 g/dL (ref 5.7–8.2)
SODIUM: 144 mmol/L (ref 135–145)

## 2024-01-19 LAB — SEDIMENTATION RATE: ERYTHROCYTE SEDIMENTATION RATE: 17 mm/h — ABNORMAL HIGH (ref 0–15)

## 2024-01-19 LAB — C-REACTIVE PROTEIN: C-REACTIVE PROTEIN: 14.1 mg/L — ABNORMAL HIGH (ref <=10.0)

## 2024-01-19 MED ADMIN — ondansetron (ZOFRAN-ODT) disintegrating tablet 4 mg: 4 mg | ORAL | @ 23:00:00 | Stop: 2024-01-19

## 2024-01-19 MED ADMIN — HYDROmorphone (PF) (DILAUDID) injection 1 mg: 1 mg | INTRAVENOUS | @ 23:00:00 | Stop: 2024-01-19

## 2024-01-19 MED ADMIN — hydrOXYzine (ATARAX) tablet 25 mg: 25 mg | ORAL | @ 23:00:00 | Stop: 2024-01-19

## 2024-01-19 MED ADMIN — lactated ringers bolus 1,000 mL: 1000 mL | INTRAVENOUS | @ 23:00:00 | Stop: 2024-01-19

## 2024-01-19 NOTE — Unmapped (Signed)
 [facility] at [facility]  Established Visit       Reason for Visit: Inflammatory Bowel Disease  Referring Provider: None Per Patient Pcp   Primary Care Provider: Agatha Glennon HERO, Kennedy    Assessment & Plan: Jerome Kennedy is a 48 y.o. male with a PMH of PMHx of Crohn's disease of small and large intestine (dx 1990's) s/p ileocecectomy (2003) c/b SBO 2/2 to stricturing disease s/p right hemicolectomy and end-ileostomy (08/2019) complicated by short gut syndrome and high ostomy output s/p takedown and reanastomosis (10/2019), ventral abdominal hernia and incisional hernia s/p repair (2004) with recurrence, recurrent CDI (s/p PO vancomycin  04/2022), prior homelessness and  non-compliance, polysubstance abuse (cocaine and marijuana) that is seen for follow-up of ileocolonic Crohn's disease.    Patient with ongoing symptoms on weekly Humira . He was advised colonoscopy on several occasions but left the hospital AMA. Inpatient colonoscopy was finally obtained on 11/30/23 which showed Rutgeerts i3 disease in the 10 cm of neo-TI examined with an otherwise normal colon (fecal calprotecin 717). He was switched to Rinvoq  (which he did not start for over month) which was later discontinued after he saw the tablets in his stool. Plan was to switch to Skyrizi, however a peripheral IV was unable to be placed during his first infusion. Due to his difficult venous access we will plan to switch him to Tremfya .     Overall we are very concerned for medication non-compliance and potential loss to follow-up given hx of no-show/cancellations to clinic visits.     Recommendations  - stop Skyrizi and switch to Tremfya    - TB quant negative 02/2021   - Hep B surface Ab NR, surface Ag NR, core AB NR on 12/02/23  - complete prednisone  taper as previously prescribed   - labs: up to date, iron  stores adequate 11/2023, Vit D adequate 11/2023, B12 levels are improved after repletion  - health maintenance: plan for heplisav and shingrix next visit     Discussed with attending Dr. Junette who is in agreement with above assessment and plan.     Jerome Kennedy   Gastroenterology Fellow    Return in about 3 months (around 04/15/2024). Transitioning care to Anette Jenkins Pike on 04/19/24.          Subjective   HPI: Jerome Kennedy. is a 48 y.o. male with a PMH of PMHx of Crohn's disease of small and large intestine (dx 1990's) s/p ileocecectomy (2003) c/b SBO 2/2 to stricturing disease s/p right hemicolectomy and end-ileostomy (08/2019) complicated by short gut syndrome and high ostomy output s/p colostomy takedown and reanastomosis (10/2019), ventral abdominal hernia and incisional hernia s/p repair (2004) and recurrent CDI (s/p PO vancomycin  04/2022) that is seen in consultation at the request of None Per Patient Pcp for follow-up of Crohn's disease.     Patient was admitted to [facility] Sept 2023 for RLQ pain and diarrhea, initially started on steroids due to concern for CD flare however found to have uncomplicated C diff. His steroids were stopped and he was treated with PO vancomycin  QID with resolution. He reports he is now having two formed bowel movements daily with no blood.  He does however admit to abdominal pain, infraumbilical, a few days before his next Humira  dose.  He is on Humira  q. 14 days reports that abdominal pains tends to start around day 10, and resolves after his Humira  dose.  He also reveals he will be leaving the men shelter at  [facility] where he is currently staying.  Thinks he will be moving to [facility] [place]  in January.  Inquires about IBD providers in [place].    CLINICAL SUMMARY:  - 06/10/22 initial visit, continue humira , check fCal   - 07/2022: inpatient admission for outpatient labs showing hypocalcemia, CT with long segment circumferential bowel wall thickening and hyperenhancement of the neoterminal ileum extending through the anastomosis of the proximal colon which was increased from prior CT scan along with luminal narrowing of this long segment of terminal ileum with focal proximal dilation concerning for stricture. Continue weekly humira  (had been increased 1 week prior to hospital admission).   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 Advised no steroids and continue Humira  on discharge  - 11/2022: multiple hospitalizations for abd pain, left AMA on 4/14 and again left AMA on 4/20. Re-admitted 4/23 abd underwent colonoscopy 11/30/23 which showed ongoing ileal disease in the 10 cm of neo-TI examined, Rutgeerts i3, colon otherwise normal. Advised to switch to Rinvoq  however he did not start therapy until over a month later despite insurance approval (non-compliance). Saw the Rinvoq  tablets in his stool so plan was to switch to Skyrizi however a peripheral IV was unable to be placed during his first infusion despite 5 staff members attempting IV placement so aborted. Switch to Tremfya .     SUBJECTIVE:   Seen immediately after his infusion appointment - Skyrizi was unable to be initiated due to inability to place an IV. He states 5 people tried without success. In he hospital they are usually able to get in an IV with the ultrasound. He has no abd pain today.     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    Myalgia     Rhinovirus infection 10/29/2022 SBO (small bowel obstruction)    2011       Surgical History:  Past Surgical History:   Procedure Laterality Date    CHOLECYSTECTOMY      COLON SURGERY      partial resection    HERNIA REPAIR      PR ARTHRP INTERCARPAL/CARP/MTCRPL JT INTERPOSITION Left 08/07/2022    Procedure: INTERPOSIT ARTHROPLASTY-INTERCARPAL/CARPOMETACAR;  Surgeon: Bonifacio Cordella HERO, Kennedy;  Location: MAIN OR [facility];  Service: Orthopedics    PR CLOSE ENTEROSTOMY,RESEC+COLOREC ANAS Midline 11/01/2019    Procedure: CLO ENTEROSTOMY; W/RESECT AUGIE FLAKES;  Surgeon: Aloysius Euell Quale, Kennedy;  Location: MAIN OR [place];  Service: Gastrointestinal    PR COLONOSCOPY FLX DX W/COLLJ SPEC WHEN PFRMD Left 01/07/2013    Procedure: COLONOSCOPY, FLEXIBLE, PROXIMAL TO SPLENIC FLEXURE; DIAGNOSTIC, W/WO COLLECTION SPECIMEN BY BRUSH OR WASH;  Surgeon: Liliane JENEANE Marker, Kennedy;  Location:  GI PROCEDURES MEMORIAL [facility];  Service: Gastroenterology    PR COLONOSCOPY FLX DX W/COLLJ SPEC WHEN PFRMD  03/09/2015    Procedure: COLONOSCOPY, FLEXIBLE, PROXIMAL TO SPLENIC FLEXURE; DIAGNOSTIC, W/WO COLLECTION SPECIMEN BY BRUSH OR WASH;  Surgeon: Artist Jayson Leather, Kennedy;  Location: GI PROCEDURES [facility];  Service: Gastroenterology    PR COLONOSCOPY FLX DX W/COLLJ SPEC WHEN PFRMD N/A 10/28/2019    Procedure: COLONOSCOPY, FLEXIBLE, PROXIMAL TO SPLENIC FLEXURE; DIAGNOSTIC, W/WO COLLECTION SPECIMEN BY BRUSH OR WASH;  Surgeon: Derick Bring, Kennedy;  [facility]: [facility]URES [facility];  Service: Gastroenterology    PR COLONOSCOPY FLX DX W/COLLJ SPEC WHEN PFRMD N/A 10/01/2022    Procedure: COLONOSCOPY, FLEXIBLE, PROXIMAL TO SPLENIC FLEXURE; DIAGNOSTIC, W/WO COLLECTION SPECIMEN BY BRUSH OR WASH;  Surgeon: Darrick Peck, Kennedy;  Location: GI PROCEDURES [facility];  Service: Gastroenterology    PR COLONOSCOPY FLX DX W/COLLJ SPEC WHEN PFRMD N/A 11/30/2023    Procedure: COLONOSCOPY, FLEXIBLE, PROXIMAL TO SPLENIC FLEXURE; DIAGNOSTIC, W/WO COLLECTION SPECIMEN BY BRUSH OR WASH;  Surgeon: Audrey Lacinda Hun, Kennedy;  Location: GI PROCEDURES [facility];  Service: Gastroenterology    PR EXPLORATORY OF ABDOMEN Midline 09/02/2019    Procedure: EXPLORATORY LAPAROTOMY, EXPLORATORY CELIOTOMY WITH OR WITHOUT BIOPSY(S);  Surgeon: Aloysius Euell Quale, Kennedy;  Location: MAIN OR [facility];  Service: Gastrointestinal    PR EXPLORATORY OF ABDOMEN Midline 11/01/2019    Procedure: EXPLORATORY LAPAROTOMY, EXPLORATORY CELIOTOMY WITH OR WITHOUT BIOPSY(S);  Surgeon: Aloysius Euell Quale, Kennedy;  Location: MAIN OR [facility];  Service: Gastrointestinal    PR FIX FINGER,VOLAR PLATE,I-P JT Left 08/07/2022    Procedure: REPAIR AND RECONSTRUCTION, FINGER, VOLAR PLATE, INTERPHALANGEAL JOINT;  Surgeon: Bonifacio Cordella HERO, Kennedy;  Location: MAIN OR [facility];  Service: Orthopedics    PR FREEING BOWEL ADHESION,ENTEROLYSIS N/A 11/01/2019    Procedure: Enterolysis (Separt Proc);  Surgeon: Aloysius Euell Quale, Kennedy;  Location: MAIN OR [facility];  Service: Gastrointestinal    PR FUSION MC-P JT Left 08/27/2023    Procedure: ARTHRODESIS, METACARPOPHALANGEAL JOINT, WITH OR WITHOUT INTERNAL FIXATION;  Surgeon: Bonifacio Cordella HERO, Kennedy;  Location: OR [facility];  Service: Orthopedics    PR ILEOSCOPY THRU STOMA,BIOPSY N/A 10/14/2019    Procedure: LYNETT; LORN 1/MX;  Surgeon: Thedora Alm Plain, Kennedy;  Location: GI PROCEDURES [facility];  Service: Gastroenterology    PR PART REMOVAL COLON W COLOSTOMY Midline 09/02/2019    Procedure: COLECTOMY, PARTIAL; WITH SKIN LEVEL CECOSTOMY OR COLOSTOMY;  Surgeon: Aloysius Euell Quale, Kennedy;  Location: MAIN OR [facility];  Service: Gastrointestinal    PR REPAIR INCISIONAL HERNIA,REDUCIBLE Midline 09/02/2019    Procedure: REPAIR INIT INCISIONAL OR VENTRAL HERNIA; REDUCIBLE;  Surgeon: Arnette Darice Burnet, Kennedy;  Location: MAIN OR [place];  Service: Trauma    PR UPPER GI ENDOSCOPY,BIOPSY N/A 01/07/2013    Procedure: UGI ENDOSCOPY; WITH BIOPSY, SINGLE OR MULTIPLE;  Surgeon: Liliane JENEANE Marker, Kennedy;  Location: GI PROCEDURES [facility];  Service: Gastroenterology    PR UPPER GI ENDOSCOPY,BIOPSY N/A 10/14/2019    Procedure: UGI ENDOSCOPY; WITH BIOPSY, SINGLE OR MULTIPLE;  Surgeon: Thedora Alm Plain, Kennedy;  Location: GI PROCEDURES [facility];  Service: Gastroenterology    PR UPPER GI ENDOSCOPY,DIAGNOSIS N/A 10/01/2022    Procedure: UGI ENDO, INCLUDE ESOPHAGUS, STOMACH, & DUODENUM &/OR JEJUNUM; DX W/WO COLLECTION SPECIMN, BY BRUSH OR WASH;  Surgeon: Darrick Peck, Kennedy;  Location: GI PROC[facility]URES [facility];  Service: Gastroenterology       Family History:  Family History   Problem Relation Age of Onset    Breast cancer Mother  Diabetes Maternal Aunt     Colon cancer Maternal Grandmother     Prostate cancer Maternal Grandfather     Cancer Paternal Grandmother     Crohn's disease Neg Hx        Social History:  Social History     Tobacco Use    Smoking status: Every Day     Current packs/day: 1.00     Average packs/day: 0.6 packs/day for 22.3 years (12.8 ttl pk-yrs)     Types: Cigarettes     Start date: 10/02/2001     Passive exposure: Current    Smokeless tobacco: Never    Tobacco comments:     1ppd   Vaping Use    Vaping status: Some Days    Substances: Nicotine     Passive vaping exposure: Yes   Substance Use Topics    Alcohol use: No     Alcohol/week: 0.0 standard drinks of alcohol    Drug use: Yes     Types: Marijuana     Comment: Smokes 2-3 joints/day       Medications:   Prior to Admission medications   Medication Dose, Route, Frequency   cariprazine  (VRAYLAR ) 1.5 mg capsule 1.5 mg, Daily (standard)   cholecalciferol , vitamin D3 25 mcg, 1,000 units,, 1,000 unit (25 mcg) tablet 25 mcg, Oral, Daily (standard)   dicyclomine  (BENTYL ) 10 mg capsule 10 mg, Oral, 4 times a day PRN   empty container Misc Use as directed   ferrous sulfate  325 (65 FE) MG tablet 325 mg, Daily   FLONASE  ALLERGY RELIEF 50 mcg/actuation nasal spray 2 sprays, Daily PRN   loperamide  (IMODIUM ) 2 mg capsule 2 mg, Oral, Daily, Take 1 hour before Rinvoq    nicotine  (NICODERM CQ ) 21 mg/24 hr patch 1 patch, Transdermal, Daily (standard)   nicotine  polacrilex (NICORETTE MINI) lozenge 2 mg 2 mg, Buccal, Every 1 hour prn   nicotine  polacrilex (NICORETTE) 4 MG gum 4 mg, Buccal, Every 1 hour prn   ondansetron  (ZOFRAN -ODT) 4 MG disintegrating tablet Dissolve 1 tablet (4 mg total) in the mouth every eight (8) hours as needed for nausea.   OPTICHAMBER DIAMOND V[facility] Spcr    pantoprazole  (PROTONIX ) 40 MG tablet 40 mg, Oral, Daily (standard)   predniSONE  (DELTASONE ) 10 MG tablet Take 4 tablets (40 mg total) by mouth daily for 12 days, THEN 3 tablets (30 mg total) daily for 7 days, THEN 2 tablets (20 mg total) daily for 7 days, THEN 1.5 tablets (15 mg total) daily for 7 days, THEN 1 tablet (10 mg total) daily for 7 days, THEN 0.5 tablets (5 mg total) daily for 7 days.   QUEtiapine  (SEROQUEL ) 100 MG tablet Take 1 tablet (100 mg total) by mouth nightly.   sulfamethoxazole -trimethoprim  (BACTRIM  DS) 800-160 mg per tablet 1 tablet, Oral, 3 times weekly   SYMBICORT  80-4.5 mcg/actuation inhaler 2 puffs, Inhalation, Daily (RT)   upadacitinib  (RINVOQ ) 45 mg Tb24 45 mg, Oral, Daily (standard)   VENTOLIN  HFA 90 mcg/actuation inhaler 2 puffs, Inhalation, Every 6 hours PRN   guselkumab  (TREMFYA  PEN INDUCTION PK-CROHN) 200 mg/2 mL PnIj Inject the contents of 2 pens (400 mg) under the skin every twenty-eight (28) days.   guselkumab  (TREMFYA  PEN) 200 mg/2 mL PnIj Inject the contents of 1 pen (200 mg) under the skin every twenty-eight (28) days.       Allergies:  Morphine  and Toradol [ketorolac]    Review of Systems:  10 systems were reviewed and are negative unless otherwise mentioned in the  HPI            Objective   Vital Signs:  Vitals:    01/14/24 1505   BP: 129/78   BP Position: Sitting   Pulse: 91   Temp: 36.4 ??C (97.6 ??F)   Weight: 68.9 kg (152 lb)   Height: 172.7 cm (5' 7.99)        Body mass index is 23.12 kg/m??.    Wt Readings from Last 12 Encounters:   01/15/24 68.9 kg (152 lb)   01/14/24 68.9 kg (152 lb)   01/14/24 69.3 kg (152 lb 12.8 oz) 01/03/24 65.8 kg (145 lb)   12/29/23 65.8 kg (145 lb)   11/21/23 68 kg (150 lb)   11/15/23 68 kg (150 lb)   10/31/23 76.9 kg (169 lb 8 oz)   10/18/23 69.4 kg (153 lb)   10/11/23 68.5 kg (151 lb 0.2 oz)   09/28/23 68.5 kg (151 lb)   08/27/23 66.8 kg (147 lb 4.3 oz)       Physical Exam:   Gen: NAD, answers questions appropriately  Abdomen: abdominal hernia, reducible,   Extremities: no c/c/e, pulses +2 in b/l UE/LEs  Neuro: CN II-XI grossly intact, normal cerebellar function, normal gait. No focal deficits.  Skin:  No rashes, lesions on clothed exam  Psych: Alert and oriented, normal mood and affect.          GI Imaging and Procedures:     Colonoscopy 11/30/23:   Findings:       The perianal and digital rectal examinations were normal.       Extensive amounts of stool was found in the entire colon, making        visualization difficult.       There was evidence of a prior end-to-side ileo-colonic anastomosis in        the ascending colon. This was patent and was characterized by very mild        narrowing. The anastomosis was traversed with colonoscope without        difficulty and without dilation effect from scope passage.       The Simple Endoscopic Score for Crohn's Disease was determined based on        the endoscopic appearance of the mucosa in the following segments:       - Ileum: Findings include ulcers greater than 2 cm in size, 10-30%        ulcerated surfaces, 50-75% of surfaces affected and no narrowings.        Segment score: 7. Up to 10cm proximal to the anastomosis was examined        and involved.       - Right Colon: Findings include no ulcers present, no ulcerated        surfaces, no affected surfaces and no narrowings. Segment score: 0.       - Transverse Colon: Findings include no ulcers present, no ulcerated        surfaces, no affected surfaces and no narrowings. Segment score: 0.       - Left Colon: Findings include no ulcers present, no ulcerated surfaces,        no affected surfaces and no narrowings. Segment score: 0.       - Rectum: Findings include no ulcers present, no ulcerated surfaces, no        affected surfaces and no narrowings. Segment score: 0.       - Total SES-CD aggregate score: 7. Rutgeert's i3  disease.                                                                                   Estimated Blood Loss:       Estimated blood loss: none.  Impression:            - Preparation of the colon was inadequate.                         - Stool in the entire examined colon.                         - Patent end-to-side ileo-colonic anastomosis,                          characterized by only very mild narrowing.                         - Simple Endoscopic Score for Crohn's Disease: 7,                          mucosal inflammatory changes secondary to Crohn's                          disease with ileitis. Rutgeerts i3. No colonic                          involvement on today's exam.                         - No specimens collected.      EGD/Colonoscopy 10/01/22:   Findings:       A small hiatal hernia was present.       The exam of the esophagus was otherwise normal.       The entire examined stomach was normal.       The examined duodenum was normal.       There was no evidence of Crohn's disease seen on the EGD.                                                                                   Impression:            - Small hiatal hernia.                         - Normal stomach.                         - Normal examined duodenum.                         -  No specimens collected.    Findings:       A large amount of semi-liquid semi-solid stool was found in the entire        colon, precluding visualization. This made the endoscopy challenging and        the procedure had to be aborted because the ileocolonic anastomosis was        reached.       The Simple Endoscopic Score for Crohn's Disease was determined based on        the endoscopic appearance of the mucosa in the following segments: Segment score: 0. Segment score: 0. Segment score: 0.       - Left Colon: Based on visualized mucosa, findings include no ulcers        present, no ulcerated surfaces, no affected surfaces and no narrowings.        Segment score: 0.       - Rectum: Based on visualized mucos, findings include no ulcers present,        no ulcerated surfaces, no affected surfaces and no narrowings. Segment        score: 0.       - Total SES-CD aggregate score: 0.                                                                                   Impression:            - Stool in the entire examined colon.                         - Simple Endoscopic Score for Crohn's Disease: 0,                          mucosal inflammatory changes secondary to Crohn's                          disease, in remission.                         - No specimens collected.    Colonoscopy 09/06/21:

## 2024-01-19 NOTE — Unmapped (Signed)
 [facility]  Emergency Department Provider Note     ED Clinical Impression     Final diagnoses:   Right lower quadrant abdominal pain (Primary)      Impression, Medical Decision Making, ED Course     Impression: 48 y.o. male with PMH most significant for Crohn's, SBO, COPD, chronic steroid use on PJP prophylaxis with Bactrim  who presents with abdominal pain as described below.     DDx/MDM: Recently seen in ED on 6/14 for right lower quadrant abdominal pain likely secondary to Crohn's disease.  He is presenting now with worsening right lower quadrant abdominal pain and vomiting similar to previous flares of Crohn's disease.  He is still unable to get his prescribed Tremfya .  Exam with lower quadrant abdominal pain with guarding.  Vital signs stable.  Labs with elevated CRP and ESR compared to 6/14.  CBC and CMP unremarkable.  Lipase negative.  UA pending.  Will get CT abdomen pelvis for further evaluation.    Diagnostic workup as below. Will treat patient with IV fluids, Dilaudid , Zofran .    Orders Placed This Encounter   Procedures    Urine Culture    CT Abdomen Pelvis W IV Contrast    CBC w/ Differential    Comprehensive Metabolic Panel    Lipase Level    Urinalysis with Microscopy with Culture Reflex    Sedimentation rate, manual    C-reactive protein    ECG 12 Lead       ED Course as of 01/19/24 2244   Tue Jan 19, 2024   1819 CBC w/ Differential(!):    WBC 5.1   RBC 4.79   HGB 13.5   HCT 40.1   MCV 83.7   MCH 28.2   MCHC 33.7   RDW 18.8(!)   MPV 7.8   Platelet 231   Neutrophils % 43.6   Lymphocytes % 47.4   Monocytes % 8.3   Eosinophils % 0.4   Basophils % 0.3   Absolute Neutrophils 2.2   Absolute Lymphocytes 2.4   Absolute Monocytes  0.4   Absolute Eosinophils 0.0   Absolute Basophils  0.0   Anisocytosis Slight(!)   1819 Comprehensive Metabolic Panel(!):    Sodium 144   Potassium 3.4   Chloride 110(!)   CO2 23.0   Anion Gap 11   Bun 12   Creatinine 1.08   BUN/Creatinine Ratio 11   eGFR CKD-EPI (2021) Male 85 Glucose 88   Calcium  8.6(!)   Albumin 3.7   Total Protein 6.9   Total Bilirubin 0.5   SGOT (AST) 22   ALT 36   Alkaline Phosphatase 163(!)  Alk phos appears to be near baseline   1820 C-reactive protein(!):    CRP 14.1(!)   1820 Sedimentation rate, manual(!):    Sed Rate 17(!)   1820 CRP and sed rate elevated from 3 days ago   1820 Lipase Level:    Lipase 38   1821 EKG normal sinus rhythm without any acute findings   2242 Urinalysis with Microscopy with Culture Reflex(!):    Color, UA Yellow   Clarity, UA Clear   Spec Grav, UA 1.032(!)   pH, UA 6.5   Leukocyte Esterase, UA Small(!)   Nitrite, UA Negative   Protein, UA Trace(!)   Glucose, UA Negative   Ketones, UA Negative   Urobilinogen, UA <2.0 mg/dL   Bilirubin, UA Negative   Blood, UA Negative   RBC, UA 2   WBC, UA 11(!)  Squam Epithel, UA 3   Bacteria, UA None Seen   Mucus, UA Few(!)     ____________________________________________    The case was discussed with the attending physician, who is in agreement with the above assessment and plan.      History     Chief Complaint  Chief Complaint   Patient presents with    Abdominal Pain       HPI   Jerome Kennedy. is a 48 y.o. male with past medical history as below who presents with worsening right lower quadrant abdominal pain.  Was seen in ED on 6/14 for similar pain likely secondary to Crohn's disease flare.  At that time, his pain and nausea improved with Dilaudid  and Compazine  he was discharged after successful p.o. challenge.  He is presenting today with worsening right lower quadrant abdominal pain and vomiting with inability to tolerate p.o. intake.  He has still been unable to obtain his prescribed Tremfya .  Denies any fever/chills, chest pain, shortness of breath, diarrhea, or dysuria.      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;  Service: Orthopedics    PR CLOSE ENTEROSTOMY,RESEC+COLOREC ANAS Midline 11/01/2019    Procedure: CLO ENTEROSTOMY; W/RESECT AUGIE FLAKES;  Surgeon: Aloysius Euell Quale, MD;  Location: MAIN OR [facility];  Service: Gastrointestinal    PR COLONOSCOPY FLX DX W/COLLJ SPEC WHEN PFRMD Left 01/07/2013    Procedure: COLONOSCOPY, FLEXIBLE, PROXIMAL TO SPLENIC FLEXURE; DIAGNOSTIC, W/WO COLLECTION SPECIMEN BY BRUSH OR WASH;  Surgeon: Liliane JENEANE Marker, MD;  [facility];  Service: Gastroenterology    PR COLONOSCOPY FLX DX W/COLLJ SPEC WHEN PFRMD  03/09/2015    Procedure: COLONOSCOPY, FLEXIBLE, PROXIMAL TO SPLENIC FLEXURE; DIAGNOSTIC, W/WO COLLECTION SPECIMEN BY BRUSH OR WASH;  Surgeon: Artist Jayson Leather, MD;  Location: [facility];  Service: Gastroenterology    PR COLONOSCOPY FLX DX W/COLLJ SPEC WHEN PFRMD N/A 10/28/2019    Procedure: COLONOSCOPY, FLEXIBLE, PROXIMAL TO SPLENIC FLEXURE; DIAGNOSTIC, W/WO COLLECTION SPECIMEN BY BRUSH OR WASH;  Surgeon: Derick Bring, MD;  [facility]: [facility];  Service: Gastroenterology    PR COLONOSCOPY FLX DX W/COLLJ SPEC WHEN PFRMD N/A 10/01/2022    Procedure: COLONOSCOPY, FLEXIBLE, PROXIMAL TO SPLENIC FLEXURE; DIAGNOSTIC, W/WO COLLECTION SPECIMEN BY BRUSH OR WASH;  Surgeon: Darrick Peck, MD;  Location: [facility];  Service: Gastroenterology  PR COLONOSCOPY FLX DX W/COLLJ SPEC WHEN PFRMD N/A 11/30/2023    Procedure: COLONOSCOPY, FLEXIBLE, PROXIMAL TO SPLENIC FLEXURE; DIAGNOSTIC, W/WO COLLECTION SPECIMEN BY BRUSH OR WASH;  Surgeon: Audrey Lacinda Hun, MD;  Location: [facility];  Service: Gastroenterology    PR EXPLORATORY OF ABDOMEN Midline 09/02/2019    Procedure: EXPLORATORY LAPAROTOMY, EXPLORATORY CELIOTOMY WITH OR WITHOUT BIOPSY(S);  Surgeon: Aloysius Euell Quale, MD;  Location: MAIN OR [facility];  Service: Gastrointestinal    PR EXPLORATORY OF ABDOMEN Midline 11/01/2019    Procedure: EXPLORATORY LAPAROTOMY, EXPLORATORY CELIOTOMY WITH OR WITHOUT BIOPSY(S);  Surgeon: Aloysius Euell Quale, MD;  Location: MAIN OR [facility];  Service: Gastrointestinal    PR FIX FINGER,VOLAR PLATE,I-P JT Left 08/07/2022    Procedure: REPAIR AND RECONSTRUCTION, FINGER, VOLAR PLATE, INTERPHALANGEAL JOINT;  Surgeon: Bonifacio Cordella HERO, MD;  Location: MAIN OR [facility];  Service: Orthopedics    PR FREEING BOWEL ADHESION,ENTEROLYSIS N/A 11/01/2019    Procedure: Enterolysis (Separt Proc);  Surgeon: Aloysius Euell Quale, MD;  Location: MAIN OR [facility];  Service: Gastrointestinal    PR FUSION MC-P JT Left 08/27/2023    Procedure: ARTHRODESIS, METACARPOPHALANGEAL JOINT, WITH OR WITHOUT INTERNAL FIXATION;  Surgeon: Bonifacio Cordella HERO, MD;  Location: OR [facility];  Service: Orthopedics    PR ILEOSCOPY THRU STOMA,BIOPSY N/A 10/14/2019    Procedure: LYNETT; LORN 1/MX;  Surgeon: Thedora Alm Plain, MD;  Location: [facility];  Service: Gastroenterology    PR PART REMOVAL COLON W COLOSTOMY Midline 09/02/2019    Procedure: COLECTOMY, PARTIAL; WITH SKIN LEVEL CECOSTOMY OR COLOSTOMY;  Surgeon: Aloysius Euell Quale, MD;  Location: MAIN OR [facility];  Service: Gastrointestinal    PR REPAIR INCISIONAL HERNIA,REDUCIBLE Midline 09/02/2019    Procedure: REPAIR INIT INCISIONAL OR VENTRAL HERNIA; REDUCIBLE; Surgeon: Arnette Darice Burnet, MD;  Location: MAIN OR [facility];  Service: Trauma    PR UPPER GI ENDOSCOPY,BIOPSY N/A 01/07/2013    Procedure: UGI ENDOSCOPY; WITH BIOPSY, SINGLE OR MULTIPLE;  Surgeon: Liliane JENEANE Marker, MD;  Location: [facility];  Service: Gastroenterology    PR UPPER GI ENDOSCOPY,BIOPSY N/A 10/14/2019    Procedure: UGI ENDOSCOPY; WITH BIOPSY, SINGLE OR MULTIPLE;  Surgeon: Thedora Alm Plain, MD;  Location: [facility];  Service: Gastroenterology    PR UPPER GI ENDOSCOPY,DIAGNOSIS N/A 10/01/2022    Procedure: UGI ENDO, INCLUDE ESOPHAGUS, STOMACH, & DUODENUM &/OR JEJUNUM; DX W/WO COLLECTION SPECIMN, BY BRUSH OR WASH;  Surgeon: Darrick Peck, MD;  Location: [facility];  Service: Gastroenterology         Current Facility-Administered Medications:     diphenhydrAMINE  (BENADRYL ) injection 25 mg, 25 mg, Intravenous, Once, Mischo, Donnice Standing, MD    Current Outpatient Medications:     cariprazine  (VRAYLAR ) 1.5 mg capsule, Take 1 capsule (1.5 mg total) by mouth daily., Disp: , Rfl:     cholecalciferol , vitamin D3 25 mcg, 1,000 units,, 1,000 unit (25 mcg) tablet, Take 1 tablet (25 mcg total) by mouth daily., Disp: 30 tablet, Rfl: 0    dicyclomine  (BENTYL ) 10 mg capsule, Take 1 capsule (10 mg total) by mouth four (4) times a day as needed., Disp: , Rfl:     empty container Misc, Use as directed, Disp: 1 each, Rfl: 3    ferrous sulfate  325 (65 FE) MG tablet, Take 1 tablet (325 mg total) by mouth in the morning., Disp: , Rfl:     FLONASE  ALLERGY RELIEF 50 mcg/actuation nasal spray, 2 sprays into each nostril daily as needed., Disp: ,  Rfl:     guselkumab  (TREMFYA  PEN INDUCTION PK-CROHN) 200 mg/2 mL PnIj, Inject the contents of 2 pens (400 mg) under the skin every twenty-eight (28) days., Disp: 4 mL, Rfl: 2    guselkumab  (TREMFYA  PEN) 200 mg/2 mL PnIj, Inject the contents of 1 pen (200 mg) under the skin every twenty-eight (28) days., Disp: 2 mL, Rfl: 5    loperamide  (IMODIUM ) 2 mg capsule, Take 1 capsule (2 mg total) by mouth in the morning. Take 1 hour before Rinvoq ., Disp: 30 capsule, Rfl: 0    nicotine  (NICODERM CQ ) 21 mg/24 hr patch, Place 1 patch on the skin daily., Disp: 28 patch, Rfl: 0    nicotine  polacrilex (NICORETTE MINI) lozenge 2 mg, Apply 1 lozenge (2 mg total) to cheek every hour as needed for smoking cessation., Disp: 144 each, Rfl: 0    nicotine  polacrilex (NICORETTE) 4 MG gum, Apply 1 each (4 mg total) to cheek every hour as needed for smoking cessation., Disp: 110 each, Rfl: 0    ondansetron  (ZOFRAN -ODT) 4 MG disintegrating tablet, Dissolve 1 tablet (4 mg total) in the mouth every eight (8) hours as needed for nausea., Disp: 21 tablet, Rfl: 0    OPTICHAMBER DIAMOND [facility], , Disp: , Rfl:     pantoprazole  (PROTONIX ) 40 MG tablet, Take 1 tablet (40 mg total) by mouth daily., Disp: 90 tablet, Rfl: 0    predniSONE  (DELTASONE ) 10 MG tablet, Take 4 tablets (40 mg total) by mouth daily for 12 days, THEN 3 tablets (30 mg total) daily for 7 days, THEN 2 tablets (20 mg total) daily for 7 days, THEN 1.5 tablets (15 mg total) daily for 7 days, THEN 1 tablet (10 mg total) daily for 7 days, THEN 0.5 tablets (5 mg total) daily for 7 days., Disp: 104 tablet, Rfl: 0    QUEtiapine  (SEROQUEL ) 100 MG tablet, Take 1 tablet (100 mg total) by mouth nightly., Disp: , Rfl:     sulfamethoxazole -trimethoprim  (BACTRIM  DS) 800-160 mg per tablet, Take 1 tablet (160 mg of trimethoprim  total) by mouth 3 (three) times a week., Disp: 12 tablet, Rfl: 0    SYMBICORT  80-4.5 mcg/actuation inhaler, Inhale 2 puffs  in the morning., Disp: 10.2 g, Rfl: 0    VENTOLIN  HFA 90 mcg/actuation inhaler, Inhale 2 puffs every six (6) hours as needed., Disp: 18 g, Rfl: 0    Allergies  Morphine  and Toradol [ketorolac]    Family History  Family History[1]    Social History  Short Social History[2]     Physical Exam     VITAL SIGNS:      Vitals:    01/19/24 1415 01/19/24 1418 01/19/24 1701   BP:  110/84 108/80 Pulse: 115 98 78   Resp:  18 16   Temp:  36.3 ??C (97.3 ??F) 36.7 ??C (98 ??F)   TempSrc:  Oral Oral   SpO2: 98% 99% 96%       Constitutional: Alert and oriented. No acute distress.  Eyes: Conjunctivae are normal.  HEENT: Normocephalic and atraumatic. Conjunctivae clear. No congestion. Moist mucous membranes.   Cardiovascular: Rate as above, regular rhythm. Normal and symmetric distal pulses. Brisk capillary refill. Normal skin turgor.  Respiratory: Normal respiratory effort. Breath sounds are normal. There are no wheezing or crackles heard.  Gastrointestinal: Soft, non-distended, non-tender.  Genitourinary: Deferred.  Musculoskeletal: Non-tender with normal range of motion in all extremities.  Neurologic: Normal speech and language. No gross focal neurologic deficits are appreciated. Patient is moving all  extremities equally, face is symmetric at rest and with speech.  Skin: Skin is warm, dry and intact. No rash noted.  Psychiatric: Mood and affect are normal. Speech and behavior are normal.     Radiology     CT Abdomen Pelvis W IV Contrast    (Results Pending)       Pertinent labs & imaging results that were available during my care of the patient were independently interpreted by me and considered in my medical decision making (see chart for details).    Portions of this record have been created using Scientist, clinical (histocompatibility and immunogenetics). Dictation errors have been sought, but may not have been identified and corrected.          [1]   Family History  Problem Relation Age of Onset    Breast cancer Mother     Diabetes Maternal Aunt     Colon cancer Maternal Grandmother     Prostate cancer Maternal Grandfather     Cancer Paternal Grandmother     Crohn's disease Neg Hx    [2]   Social History  Tobacco Use    Smoking status: Every Day     Current packs/day: 1.00     Average packs/day: 0.6 packs/day for 22.3 years (12.8 ttl pk-yrs)     Types: Cigarettes     Start date: 10/02/2001     Passive exposure: Current    Smokeless tobacco: Never    Tobacco comments:     1ppd   Vaping Use    Vaping status: Some Days    Substances: Nicotine     Passive vaping exposure: Yes   Substance Use Topics    Alcohol use: No     Alcohol/week: 0.0 standard drinks of alcohol    Drug use: Yes     Types: Marijuana     Comment: Smokes 2-3 joints/day        Moses Alyce SQUIBB, MD  Resident  01/19/24 [phone]

## 2024-01-19 NOTE — Unmapped (Signed)
 Patient rounds completed. The following patient needs were addressed:  Patient continually sitting in the waiting room awaiting the room opening .     Pt sitting in waiting room eating food and drinking soda.

## 2024-01-19 NOTE — Unmapped (Signed)
 C/C RLQ abd pain w associated N/V. Started around 1200 today. Denies sick contacts. Hx chrons. NAD in triage

## 2024-01-19 NOTE — Unmapped (Signed)
 Here for abd pain w/ N/V. Hx Crohn's.

## 2024-01-20 MED ADMIN — HYDROmorphone (PF) (DILAUDID) injection 1 mg: 1 mg | INTRAVENOUS | @ 02:00:00 | Stop: 2024-01-19

## 2024-01-20 MED ADMIN — acetaminophen (OFIRMEV) 10 mg/mL injection 1,000 mg: 1000 mg | INTRAVENOUS | @ 02:00:00 | Stop: 2024-01-19

## 2024-01-20 MED ADMIN — diphenhydrAMINE (BENADRYL) injection 25 mg: 25 mg | INTRAVENOUS | @ 04:00:00 | Stop: 2024-01-19

## 2024-01-20 MED ADMIN — iohexol (OMNIPAQUE) 350 mg iodine/mL solution 100 mL: 100 mL | INTRAVENOUS | @ 04:00:00 | Stop: 2024-01-20

## 2024-01-20 MED ADMIN — prochlorperazine (COMPAZINE) injection 5 mg: 5 mg | INTRAVENOUS | @ 06:00:00 | Stop: 2024-01-20

## 2024-01-20 NOTE — Unmapped (Signed)
 Addended by: JUNETTE FONDA CROME on: 01/20/2024 09:36 AM     Modules accepted: Level of Service

## 2024-01-20 NOTE — Unmapped (Signed)
 [facility]  Emergency Department Progress Note    January 20, 2024 2:43 AM           ED care from Dr. Ames at 0100.     Medical Decision Making and Progress Notes     48 year old male with a history of inflammatory bowel disease presenting with abdominal pain signed out pending p.o. challenge.  CT imaging showed some areas of inflammatory changes.  Labs were overall reassuring.  Tolerated p.o. challenge, requesting discharge.     Additional Progress Notes and Critical Care    NA    Portions of this record have been created using Dragon dictation software. Dictation errors have been sought, but may not have been identified and corrected.      ED Clinical Impression     Final diagnoses:   Right lower quadrant abdominal pain (Primary)

## 2024-01-21 MED ORDER — EMPTY CONTAINER
1 refills | 0.00000 days
Start: 2024-01-21 — End: ?

## 2024-01-21 NOTE — Unmapped (Signed)
 [facility]    Patient Onboarding/Medication Counseling    Jerome Kennedy is a 48 y.o. male with Crohn's who I am counseling today on initiation of therapy.  I am speaking to the patient.    Was a Nurse, learning disability used for this call? No    Verified patient's date of birth / HIPAA.    Specialty medication(s) to be sent: Inflammatory Disorders: Tremfya       Non-specialty medications/supplies to be sent: sharps      Medications not needed at this time: n/a         Tremfya  (guselkumab )    Medication & Administration     Dosage: Crohn's: Inject 2 pens (400 mg in total) at weeks 0, 4, & 8 and then start maintenance dose at week 12 of 1 pen (200 mg) every 4 weeks thereafter    Lab tests required prior to treatment initiation:  Tuberculosis: Tuberculosis screening resulted in a non-reactive Quantiferon TB Gold assay.(Completed: 02/01/2021)    Administration:   Prefilled auto-injector pen  Gather all supplies needed for injection on a clean, flat working surface: medication pen removed from packaging, alcohol swab, sharps container, etc.  Look at the medication label - look for correct medication, correct dose, and check the expiration date  Look at the medication - the liquid visible in the window on the side of the pen device should appear clear and colorless to slightly yellow and may contain tiny white or clear particles  Lay the auto-injector pen on a flat surface and allow it to warm up to room temperature for at least 30 minutes  Select injection site - you can use the front of your thigh or your belly (but not the area 2 inches around your belly button); if someone else is giving you the injection you can also use your upper arm in the skin covering your triceps muscle  Prepare injection site - wash your hands and clean the skin at the injection site with an alcohol swab and let it air dry, do not touch the injection site again before the injection  Pull off the bottom safety cap, do not remove until immediately prior to injection and do not touch the white needle cover  Position the injector against your skin at the injection site at a 90 degree angle, hold the pen such that you can see the clear medication window  Keep holding the prefilled pen against the skin for about 10 seconds to hear a second click and confirm injection is complete when the plunger rod stops moving and fills the viewing window, lift straight up  If prescribed dose requires two injections repeat above steps with second prefilled pen  Dispose of the used auto-injector pen immediately in your sharps disposal container the needle will be covered automatically  If you see any blood at the injection site, press a cotton ball or gauze on the site and maintain pressure until the bleeding stops, do not rub the injection site      Adherence/Missed dose instructions:  If your injection is given more than 7 days after your scheduled injection date - consult your pharmacist for additional instructions on how to adjust your dosing schedule.    Goals of Therapy     Crohn's disease  Achieve remission of symptoms  Maintain remission of symptoms  Minimize long-term systemic glucocorticoid use  Prevent need for surgical procedures  Maintenance of effective psychosocial functioning      Side Effects & Monitoring Parameters  Injection site reaction (redness, irritation, inflammation localized to the site of administration)  Signs of a common cold - minor sore throat, runny or stuffy nose, etc.  Joint pain  Headache    The following side effects should be reported to the provider:  Signs of a hypersensitivity reaction - rash; hives; itching; red, swollen, blistered, or peeling skin; wheezing; tightness in the chest or throat; difficulty breathing, swallowing, or talking; swelling of the mouth, face, lips, tongue, or throat; etc.  Reduced immune function - report signs of infection such as fever; chills; body aches; very bad sore throat; ear or sinus pain; cough; more sputum or change in color of sputum; pain with passing urine; wound that will not heal, etc.  Also at a slightly higher risk of some malignancies (mainly skin and blood cancers) due to this reduced immune function.  In the case of signs of infection - the patient should hold the next dose of Tremfya ?? and call your primary care provider to ensure adequate medical care.  Treatment may be resumed when infection is treated and patient is asymptomatic.  Cough with or without phlegm or blood  Shortness of breath  Stomach pain or diarrhea      Contraindications, Warnings, & Precautions     Have your bloodwork checked as you have been told by your prescriber  Talk with your doctor if you are pregnant, planning to become pregnant, or breastfeeding  Discuss the possible need for holding your dose(s) of Tremfya ?? when a planned procedure is scheduled with the prescriber as it may delay healing/recovery timeline       Drug/Food Interactions     Medication list reviewed in Epic. The patient was instructed to inform the care team before taking any new medications or supplements. No drug interactions identified.   Talk with you prescriber or pharmacist before receiving any live vaccinations while taking this medication and after you stop taking it    Storage, Handling Precautions, & Disposal     Store this medication in the refrigerator.  Do not freeze  If needed, you may store at room temperature for up to 4 hours  Store in original packaging, protected from light  Do not shake  Dispose of used syringes/pens in a sharps disposal container      Current Medications (including OTC/herbals), Comorbidities and Allergies     Current Medications[1]    Allergies[2]    Problem List[3]    Medication list has been reviewed and updated in Epic: Yes    Allergies have been reviewed and updated in Epic: Yes    Appropriateness of Therapy     Acute infections noted within Epic:  No active infections  Patient reported infection: None    Is the medication and dose appropriate based on diagnosis, medication list, comorbidities, allergies, medical history, patient???s ability to self-administer the medication, and therapeutic goals? Yes    Prescription has been clinically reviewed: Yes      Baseline Quality of Life Assessment      How many days over the past month did your crohn's  keep you from your normal activities? For example, brushing your teeth or getting up in the morning. Patient declined to answer    Financial Information     Medication Assistance provided: Prior Authorization    Anticipated copay of $4 reviewed with patient. Verified delivery address.    Delivery Information     Scheduled delivery date: 6/20    Expected start date: 6/20  Medication will be delivered via Same Day Courier to the prescription address in [facility].  This shipment will not require a signature.      Explained the services we provide at [facility] and that each month we would call to set up refills.  Stressed importance of returning phone calls so that we could ensure they receive their medications in time each month.  Informed patient that we should be setting up refills 7-10 days prior to when they will run out of medication.  A pharmacist will reach out to perform a clinical assessment periodically.  Informed patient that a welcome packet, containing information about our pharmacy and other support services, a Notice of Privacy Practices, and a drug information handout will be sent.      The patient or caregiver noted above participated in the development of this care plan and knows that they can request review of or adjustments to the care plan at any time.      Patient or caregiver verbalized understanding of the above information as well as how to contact the pharmacy at [phone] option 4 with any questions/concerns.  The pharmacy is open Monday through Friday 8:30am-4:30pm.  A pharmacist is available 24/7 via pager to answer any clinical questions they may have.    Patient Specific Needs     Does the patient have any physical, cognitive, or cultural barriers? No    Does the patient have adequate living arrangements? (i.e. the ability to store and take their medication appropriately) Yes    Did you identify any home environmental safety or security hazards? No    Patient prefers to have medications discussed with  Patient     Is the patient or caregiver able to read and understand education materials at a high school level or above? Yes    Patient's primary language is  English     Is the patient high risk? No    Does the patient have an additional or emergency contact listed in their chart? Yes    SOCIAL DETERMINANTS OF HEALTH     At the [facility], we have learned that life circumstances - like trouble affording food, housing, utilities, or transportation can affect the health of many of our patients.   That is why we wanted to ask: are you currently experiencing any life circumstances that are negatively impacting your health and/or quality of life? No    Social Drivers of Health     Food Insecurity: No Food Insecurity (12/29/2023)    Hunger Vital Sign     Worried About Running Out of Food in the Last Year: Never true     Ran Out of Food in the Last Year: Never true   Tobacco Use: High Risk (01/19/2024)    Patient History     Smoking Tobacco Use: Every Day     Smokeless Tobacco Use: Never     Passive Exposure: Current   Transportation Needs: No Transportation Needs (12/29/2023)    PRAPARE - Transportation     Lack of Transportation (Medical): No     Lack of Transportation (Non-Medical): No   Alcohol Use: Not on file   Housing: Low Risk  (12/29/2023)    Housing     Within the past 12 months, have you ever stayed: outside, in a car, in a tent, in an overnight shelter, or temporarily in someone else's home (i.e. couch-surfing)?: No     Are you worried about losing your  housing?: No   Physical Activity: Not on File (06/21/2021)    Received from [facility]    Physical Activity     Physical Activity: 0   Utilities: Low Risk  (12/29/2023)    Utilities     Within the past 12 months, have you been unable to get utilities (heat, electricity) when it was really needed?: No   Stress: Not on File (06/21/2021)    Received from [facility]    Stress     Stress: 0   Interpersonal Safety: Not At Risk (01/19/2024)    Interpersonal Safety     Unsafe Where You Currently Live: No     Physically Hurt by Anyone: No     Abused by Anyone: No   Substance Use: Not on file (06/10/2023)   Intimate Partner Violence: Not At Risk (01/19/2024)    Humiliation, Afraid, Rape, and Kick questionnaire     Fear of Current or Ex-Partner: No     Emotionally Abused: No     Physically Abused: No     Sexually Abused: No   Social Connections: Not on File (04/18/2023)    Received from [facility]    Social Connections     Connectedness: 0   Financial Resource Strain: Low Risk  (12/29/2023)    Overall Financial Resource Strain (CARDIA)     Difficulty of Paying Living Expenses: Not hard at all   Health Literacy: Not on file   Internet Connectivity: Not on file       Would you be willing to receive help with any of the needs that you have identified today? Not applicable       Jerome Kennedy, PharmD  [facility] Specialty Pharmacist       [1]   Current Outpatient Medications   Medication Sig Dispense Refill    cariprazine  (VRAYLAR ) 1.5 mg capsule Take 1 capsule (1.5 mg total) by mouth daily.      cholecalciferol , vitamin D3 25 mcg, 1,000 units,, 1,000 unit (25 mcg) tablet Take 1 tablet (25 mcg total) by mouth daily. 30 tablet 0    dicyclomine  (BENTYL ) 10 mg capsule Take 1 capsule (10 mg total) by mouth four (4) times a day as needed.      empty container Misc Use as directed 1 each 1    ferrous sulfate  325 (65 FE) MG tablet Take 1 tablet (325 mg total) by mouth in the morning.      FLONASE  ALLERGY RELIEF 50 mcg/actuation nasal spray 2 sprays into each nostril daily as needed.      guselkumab  (TREMFYA  PEN INDUCTION PK-CROHN) 200 mg/2 mL PnIj Inject the contents of 2 pens (400 mg) under the skin every twenty-eight (28) days. 4 mL 2    guselkumab  (TREMFYA  PEN) 200 mg/2 mL PnIj Inject the contents of 1 pen (200 mg) under the skin every twenty-eight (28) days. 2 mL 5    loperamide  (IMODIUM ) 2 mg capsule Take 1 capsule (2 mg total) by mouth in the morning. Take 1 hour before Rinvoq . 30 capsule 0    nicotine  (NICODERM CQ ) 21 mg/24 hr patch Place 1 patch on the skin daily. 28 patch 0    nicotine  polacrilex (NICORETTE MINI) lozenge 2 mg Apply 1 lozenge (2 mg total) to cheek every hour as needed for smoking cessation. 144 each 0    nicotine  polacrilex (NICORETTE) 4 MG gum Apply 1 each (4 mg total) to cheek every hour as needed for smoking cessation. 110 each 0  ondansetron  (ZOFRAN -ODT) 4 MG disintegrating tablet Dissolve 1 tablet (4 mg total) in the mouth every eight (8) hours as needed for nausea. 21 tablet 0    OPTICHAMBER DIAMOND VHC Spcr       pantoprazole  (PROTONIX ) 40 MG tablet Take 1 tablet (40 mg total) by mouth daily. 90 tablet 0    predniSONE  (DELTASONE ) 10 MG tablet Take 4 tablets (40 mg total) by mouth daily for 12 days, THEN 3 tablets (30 mg total) daily for 7 days, THEN 2 tablets (20 mg total) daily for 7 days, THEN 1.5 tablets (15 mg total) daily for 7 days, THEN 1 tablet (10 mg total) daily for 7 days, THEN 0.5 tablets (5 mg total) daily for 7 days. 104 tablet 0    QUEtiapine  (SEROQUEL ) 100 MG tablet Take 1 tablet (100 mg total) by mouth nightly.      sulfamethoxazole -trimethoprim  (BACTRIM  DS) 800-160 mg per tablet Take 1 tablet (160 mg of trimethoprim  total) by mouth 3 (three) times a week. 12 tablet 0    SYMBICORT  80-4.5 mcg/actuation inhaler Inhale 2 puffs  in the morning. 10.2 g 0    VENTOLIN  HFA 90 mcg/actuation inhaler Inhale 2 puffs every six (6) hours as needed. 18 g 0     No current facility-administered medications for this visit.   [2] Allergies  Allergen Reactions    Morphine  Itching and Rash     He gets light headed    Toradol [Ketorolac] Itching   [3]   Patient Active Problem List  Diagnosis    Crohn's disease       Exacerbation of Crohn's disease       Abdominal pain    Loose stools    Anemia    Hypoalbuminemia    Asymptomatic microscopic hematuria    C. 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

## 2024-01-21 NOTE — Unmapped (Signed)
 [facility] Specialty Medication Onboarding    Specialty Medication: TREMFYA  PEN INDUCTION PK-CROHN 200 mg/2 mL Pnij (guselkumab )  Prior Authorization: Approved   Financial Assistance: No - copay  <$25  Final Copay/Day Supply: $4 / 28    Insurance Restrictions: None     Notes to Pharmacist:   Credit Card on File: no  Start Date on Rx:  01/14/24    [facility] Specialty Medication Onboarding    Specialty Medication: TREMFYA  PEN 200 mg/2 mL Pnij (guselkumab )  Prior Authorization: Approved   Financial Assistance: No - copay  <$25  Final Copay/Day Supply: $4 / 28    Insurance Restrictions: None     Notes to Pharmacist:   Credit Card on File: no  Start Date on Rx:  01/14/24    The triage team has completed the benefits investigation and has determined that the patient is able to fill this medication at [facility]. Please contact the patient to complete the onboarding or follow up with the prescribing physician as needed.

## 2024-01-22 MED FILL — EMPTY CONTAINER: 120 days supply | Qty: 1 | Fill #0

## 2024-01-27 ENCOUNTER — Emergency Department
Admit: 2024-01-27 | Discharge: 2024-01-27 | Disposition: A | Discharge status: Home / Self Care | Payer: Medicaid (Managed Care)

## 2024-01-27 DIAGNOSIS — R109 Unspecified abdominal pain: Principal | ICD-10-CM

## 2024-01-27 LAB — CBC W/ AUTO DIFF
BASOPHILS ABSOLUTE COUNT: 0 10*9/L (ref 0.0–0.1)
BASOPHILS RELATIVE PERCENT: 0.3 %
EOSINOPHILS ABSOLUTE COUNT: 0 10*9/L (ref 0.0–0.5)
EOSINOPHILS RELATIVE PERCENT: 0.5 %
HEMATOCRIT: 36.2 % — ABNORMAL LOW (ref 39.0–48.0)
HEMOGLOBIN: 12.1 g/dL — ABNORMAL LOW (ref 12.9–16.5)
LYMPHOCYTES ABSOLUTE COUNT: 1.8 10*9/L (ref 1.1–3.6)
LYMPHOCYTES RELATIVE PERCENT: 36.7 %
MEAN CORPUSCULAR HEMOGLOBIN CONC: 33.5 g/dL (ref 32.0–36.0)
MEAN CORPUSCULAR HEMOGLOBIN: 28.2 pg (ref 25.9–32.4)
MEAN CORPUSCULAR VOLUME: 84.1 fL (ref 77.6–95.7)
MEAN PLATELET VOLUME: 7.7 fL (ref 6.8–10.7)
MONOCYTES ABSOLUTE COUNT: 0.6 10*9/L (ref 0.3–0.8)
MONOCYTES RELATIVE PERCENT: 11.3 %
NEUTROPHILS ABSOLUTE COUNT: 2.5 10*9/L (ref 1.8–7.8)
NEUTROPHILS RELATIVE PERCENT: 51.2 %
PLATELET COUNT: 169 10*9/L (ref 150–450)
RED BLOOD CELL COUNT: 4.31 10*12/L (ref 4.26–5.60)
RED CELL DISTRIBUTION WIDTH: 18.5 % — ABNORMAL HIGH (ref 12.2–15.2)
WBC ADJUSTED: 4.9 10*9/L (ref 3.6–11.2)

## 2024-01-27 LAB — COMPREHENSIVE METABOLIC PANEL
ALBUMIN: 2.8 g/dL — ABNORMAL LOW (ref 3.4–5.0)
ALKALINE PHOSPHATASE: 137 U/L — ABNORMAL HIGH (ref 46–116)
ALT (SGPT): 16 U/L (ref 10–49)
ANION GAP: 12 mmol/L (ref 5–14)
AST (SGOT): 17 U/L (ref <=34)
BILIRUBIN TOTAL: 0.4 mg/dL (ref 0.3–1.2)
BLOOD UREA NITROGEN: 7 mg/dL — ABNORMAL LOW (ref 9–23)
BUN / CREAT RATIO: 8
CALCIUM: 8.2 mg/dL — ABNORMAL LOW (ref 8.7–10.4)
CHLORIDE: 114 mmol/L — ABNORMAL HIGH (ref 98–107)
CO2: 17 mmol/L — ABNORMAL LOW (ref 20.0–31.0)
CREATININE: 0.91 mg/dL (ref 0.73–1.18)
EGFR CKD-EPI (2021) MALE: >90 mL/min/{1.73_m2} (ref >=60)
GLUCOSE RANDOM: 75 mg/dL (ref 70–179)
POTASSIUM: 3.6 mmol/L (ref 3.4–4.8)
PROTEIN TOTAL: 6 g/dL (ref 5.7–8.2)
SODIUM: 143 mmol/L (ref 135–145)

## 2024-01-27 LAB — LIPASE: LIPASE: 36 U/L (ref 12–53)

## 2024-01-27 LAB — C-REACTIVE PROTEIN: C-REACTIVE PROTEIN: 23.3 mg/L — ABNORMAL HIGH (ref <=10.0)

## 2024-01-27 MED ADMIN — HYDROmorphone (PF) (DILAUDID) injection 0.5 mg: 0.5 mg | INTRAVENOUS | @ 21:00:00 | Stop: 2024-01-27

## 2024-01-27 MED ADMIN — diphenhydrAMINE (BENADRYL) injection 25 mg: 25 mg | INTRAVENOUS | @ 20:00:00 | Stop: 2024-01-27

## 2024-01-27 MED ADMIN — prochlorperazine (COMPAZINE) injection 5 mg: 5 mg | INTRAVENOUS | @ 20:00:00 | Stop: 2024-01-27

## 2024-01-27 NOTE — Unmapped (Signed)
 [facility]  Emergency Department Provider Note    ED Clinical Impression     Final diagnoses:   Abdominal pain, unspecified abdominal location (Primary)     HPI, ED Course, Assessment and Plan     Initial Clinical Impression:    January 27, 2024 3:08 PM   Jerome Kennedy. is a 48 y.o. 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 The patient reports onset of bloody diarrhea, nausea and emesis. No skin rashes. No known sick contacts. No recent antibiotics. Denies dysuria, hematuria or polyuria.    BP 97/68  - Pulse 85  - Temp 36.7 ??C (98 ??F) (Oral)  - Resp 18  - Wt 68.9 kg (152 lb)  - SpO2 100%  - BMI 23.12 kg/m??     Vitals are mildly hypotensive to 97/68, otherwise within normal limits. On exam, the patient appears mildly uncomfortable due to pain, in no acute distress. Abdomen is soft, non-distended, diffusely tender to palpation. Palpable periumbilical hernia approximately 4 cm diameter, soft, fully reducible.    Medical Decision Making    Ddx: Recurrent obstruction versus possible Crohn's flare versus complication of Crohn's.     Labs  CTAP  Pain control    Further ED updates and updates to plan as per ED Course below:       1700  Signed out to Dr. Claudene pending CT.    External Records Reviewed: I have reviewed recent and relevant previous record, including: Outpatient notes - GI office note from 01/14/24 to review PMHx.    Independent Interpretation of Studies: I have independently interpreted the following studies:  none    Discussion of Management With Other Providers or Support Staff: I discussed the management of this patient with the:  none    Considerations Regarding Disposition/Escalation of Care and Critical Care:  Signed out to Dr. Claudene pending CT    Social Drivers of Health with Concerns     Tobacco Use: High Risk (01/27/2024)    Patient History     Smoking Tobacco Use: Every Day     Smokeless Tobacco Use: Never     Passive Exposure: Current   Alcohol Use: Not on file   Physical Activity: Not on File (06/21/2021)    Received from [facility]    Physical Activity     Physical Activity: 0   Stress: Not on File (06/21/2021)    Received from [facility]    Stress     Stress: 0   Substance Use: Not on file (06/10/2023)   Social Connections: Not on File (04/18/2023)    Received from [facility]    Social Connections     Connectedness: 0   Health Literacy: Not on file   Internet Connectivity: Not on file     _____________________________________________________________________    The case was discussed with the attending physician who is in agreement with the above assessment and plan    Past History     PAST MEDICAL HISTORY/PAST SURGICAL HISTORY:   Past Medical History[1]    Past Surgical History[2]    MEDICATIONS:   No current facility-administered medications for this encounter.  Current Outpatient Medications:     cariprazine  (VRAYLAR ) 1.5 mg capsule, Take 1 capsule (1.5 mg total) by mouth daily., Disp: , Rfl:     cholecalciferol , vitamin D3 25 mcg, 1,000 units,, 1,000 unit (25 mcg) tablet, Take 1 tablet (25 mcg total) by mouth daily., Disp: 30 tablet, Rfl: 0    dicyclomine  (BENTYL ) 10 mg capsule, Take 1 capsule (10 mg total) by mouth four (4) times a day as needed., Disp: , Rfl:     empty container Misc, Use as directed, Disp: 1 each, Rfl: 1    ferrous sulfate  325 (65 FE) MG tablet, Take 1 tablet (325 mg total) by mouth in the morning., Disp: , Rfl:     FLONASE  ALLERGY RELIEF 50 mcg/actuation nasal spray, 2 sprays into each nostril daily as needed., Disp: , Rfl:     guselkumab  (TREMFYA  PEN INDUCTION PK-CROHN) 200 mg/2 mL PnIj, Inject the contents of 2 pens (400 mg) under the skin every twenty-eight (28) days., Disp: 4 mL, Rfl: 2    guselkumab  (TREMFYA  PEN) 200 mg/2 mL PnIj, Inject the contents of 1 pen (200 mg) under the skin every twenty-eight (28) days., Disp: 2 mL, Rfl: 5    loperamide  (IMODIUM ) 2 mg capsule, Take 1 capsule (2 mg total) by mouth in the morning. Take 1 hour before Rinvoq ., Disp: 30 capsule, Rfl: 0    nicotine  (NICODERM CQ ) 21 mg/24 hr patch, Place 1 patch on the skin daily., Disp: 28 patch, Rfl: 0    nicotine  polacrilex (NICORETTE MINI) lozenge 2 mg, Apply 1 lozenge (2 mg total) to cheek every hour as needed for smoking cessation., Disp: 144 each, Rfl: 0    nicotine  polacrilex (NICORETTE) 4 MG gum, Apply 1 each (4 mg total) to cheek every hour as needed for smoking cessation., Disp: 110 each, Rfl: 0    ondansetron  (ZOFRAN -ODT) 4 MG disintegrating tablet, Dissolve 1 tablet (4 mg total) in the mouth every eight (8) hours as needed for nausea., Disp: 21 tablet, Rfl: 0    OPTICHAMBER DIAMOND VHC Spcr, , Disp: , Rfl:     pantoprazole  (PROTONIX ) 40 MG tablet, Take 1 tablet (40 mg total) by mouth daily., Disp: 90 tablet, Rfl: 0    predniSONE  (DELTASONE ) 10 MG tablet, Take 4 tablets (40 mg total) by mouth daily for 12 days, THEN 3 tablets (30 mg total) daily for 7 days, THEN 2 tablets (20 mg total) daily for 7 days, THEN 1.5 tablets (15 mg total) daily for 7 days, THEN 1 tablet (10 mg total) daily for 7 days, THEN 0.5 tablets (5 mg total) daily for 7 days., Disp: 104 tablet, Rfl: 0    QUEtiapine  (SEROQUEL ) 100 MG tablet, Take 1 tablet (100 mg total) by mouth nightly., Disp: , Rfl:     sulfamethoxazole -trimethoprim  (BACTRIM  DS) 800-160 mg per tablet, Take 1 tablet (160 mg of trimethoprim  total) by mouth 3 (three) times a week., Disp: 12 tablet, Rfl: 0    SYMBICORT  80-4.5 mcg/actuation inhaler, Inhale 2 puffs  in the morning., Disp: 10.2 g, Rfl: 0    VENTOLIN  HFA 90 mcg/actuation inhaler, Inhale 2 puffs every six (6) hours as needed., Disp: 18 g, Rfl: 0    ALLERGIES:   Morphine  and Toradol [ketorolac]    SOCIAL HISTORY:   Social History     Tobacco Use    Smoking status: Every Day     Current packs/day: 1.00     Average packs/day: 0.6 packs/day for 22.3 years (12.8 ttl pk-yrs)  Types: Cigarettes     Start date: 10/02/2001     Passive exposure: Current    Smokeless tobacco: Never    Tobacco comments:     1ppd   Substance Use Topics    Alcohol use: No     Alcohol/week: 0.0 standard drinks of alcohol       FAMILY HISTORY:  Family History[3]     Review of Systems     A review of systems was performed and relevant portions were as noted above in HPI     Physical Exam     VITAL SIGNS:    BP 97/68  - Pulse 85  - Temp 36.7 ??C (98 ??F) (Oral)  - Resp 18  - Wt 68.9 kg (152 lb)  - SpO2 100%  - BMI 23.12 kg/m??     Constitutional:   Alert and oriented. Appears mildly uncomfortable due to pain, in no acute distress.  Head:   Normocephalic and atraumatic  Eyes:   Conjunctivae are normal, EOMI, PERRL  ENT:   No notable congestion, Mucous membranes moist, External ears normal, no notable stridor  Cardiovascular:   Rate as vitals above. Appears warm and well perfused  Respiratory:   Normal respiratory effort. Breath sounds are normal.  Gastrointestinal:   Soft, non-distended, diffusely tender to palpation. Palpable periumbilical hernia approximately 4 cm diameter, soft, fully reducible.  Genitourinary:   Deferred  Musculoskeletal:    Normal range of motion in all extremities. No tenderness or edema noted in B/L lower extremities  Neurologic:   No gross focal neurologic deficits beyond baseline are appreciated.  Skin:   Skin is warm, dry and intact.       Radiology     No orders to display       Labs     Labs Reviewed   CBC W/ DIFFERENTIAL    Narrative:     The following orders were created for panel order CBC w/ Differential.                  Procedure Abnormality         Status                                     ---------                               -----------         ------                                     CBC w/ Differential[[id number]]                                                                                          Please view results for these tests on the individual orders.   COMPREHENSIVE METABOLIC PANEL   LIPASE   URINALYSIS WITH MICROSCOPY WITH CULTURE REFLEX    Narrative:  The following orders were created for panel order Urinalysis with Microscopy with Culture Reflex.                  Procedure                               Abnormality         Status                                     ---------                               -----------         ------                                     Urinalysis with Microsc.SABRASABRA[[id number]]                                                                                   Please view results for these tests on the individual orders.   CBC W/ AUTO DIFF   URINALYSIS WITH MICROSCOPY WITH CULTURE REFLEX PERFORMABLE         Pertinent labs & imaging results that were available during my care of the patient were reviewed by me and considered in my medical decision making (see chart for details).    Please note- This chart has been created using [employer]. Chart creation errors have been sought, but may not always be located and such creation errors, especially pronoun confusion, do NOT reflect on the standard of medical care.      Documentation assistance was provided by Lamarr Edison, Scribe, on January 27, 2024 at 3:13 PM for Gladis Duncan, MD.     Documentation assistance was provided by the scribe in my presence.  The documentation recorded by the scribe has been reviewed by me and accurately reflects the services I personally performed.          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    Myalgia     Rhinovirus infection 10/29/2022    SBO (small bowel obstruction)    2011   [2]   Past Surgical History:  Procedure Laterality Date    CHOLECYSTECTOMY      COLON SURGERY  partial resection    HERNIA REPAIR      PR ARTHRP INTERCARPAL/CARP/MTCRPL JT INTERPOSITION Left 08/07/2022    Procedure: INTERPOSIT ARTHROPLASTY-INTERCARPAL/CARPOMETACAR;  Surgeon: Bonifacio Cordella HERO, MD;  Location: MAIN OR [facility];  Service: Orthopedics    PR CLOSE ENTEROSTOMY,RESEC+COLOREC ANAS Midline 11/01/2019    Procedure: CLO ENTEROSTOMY; W/RESECT AUGIE FLAKES;  Surgeon: Aloysius Euell Quale, MD;  Location: MAIN OR [place];  Service: Gastrointestinal    PR COLONOSCOPY FLX DX W/COLLJ SPEC WHEN PFRMD Left 01/07/2013    Procedure: COLONOSCOPY, FLEXIBLE, PROXIMAL TO SPLENIC FLEXURE; DIAGNOSTIC, W/WO COLLECTION SPECIMEN BY BRUSH OR WASH;  Surgeon: Liliane JENEANE Marker, MD;  Location: GI PROCEDURES [facility];  Service: Gastroenterology    PR COLONOSCOPY FLX DX W/COLLJ SPEC WHEN PFRMD  03/09/2015    Procedure: COLONOSCOPY, FLEXIBLE, PROXIMAL TO SPLENIC FLEXURE; DIAGNOSTIC, W/WO COLLECTION SPECIMEN BY BRUSH OR WASH;  Surgeon: Artist Jayson Leather, MD;  Location: GI PROCEDURES [facility];  Service: Gastroenterology    PR COLONOSCOPY FLX DX W/COLLJ SPEC WHEN PFRMD N/A 10/28/2019    Procedure: COLONOSCOPY, FLEXIBLE, PROXIMAL TO SPLENIC FLEXURE; DIAGNOSTIC, W/WO COLLECTION SPECIMEN BY BRUSH OR WASH;  Surgeon: Derick Bring, MD;  Location: GI PROCEDURES [facility];  Service: Gastroenterology    PR COLONOSCOPY FLX DX W/COLLJ SPEC WHEN PFRMD N/A 10/01/2022    Procedure: COLONOSCOPY, FLEXIBLE, PROXIMAL TO SPLENIC FLEXURE; DIAGNOSTIC, W/WO COLLECTION SPECIMEN BY BRUSH OR WASH;  Surgeon: Darrick Peck, MD;  Location: GI PROCEDURES [facility]IAL [facility];  Service: Gastroenterology    PR COLONOSCOPY FLX DX W/COLLJ SPEC WHEN PFRMD N/A 11/30/2023    Procedure: COLONOSCOPY, FLEXIBLE, PROXIMAL TO SPLENIC FLEXURE; DIAGNOSTIC, W/WO COLLECTION SPECIMEN BY BRUSH OR WASH;  Surgeon: Audrey Lacinda Hun, MD;  Location: GI PROCEDURES [facility];  Service: Gastroenterology    PR EXPLORATORY OF ABDOMEN Midline 09/02/2019    Procedure: EXPLORATORY LAPAROTOMY, EXPLORATORY CELIOTOMY WITH OR WITHOUT BIOPSY(S);  Surgeon: Aloysius Euell Quale, MD;  Location: MAIN OR [facility];  Service: Gastrointestinal    PR EXPLORATORY OF ABDOMEN Midline 11/01/2019    Procedure: EXPLORATORY LAPAROTOMY, EXPLORATORY CELIOTOMY WITH OR WITHOUT BIOPSY(S);  Surgeon: Aloysius Euell Quale, MD;  Location: MAIN OR [facility];  Service: Gastrointestinal    PR FIX FINGER,VOLAR PLATE,I-P JT Left 08/07/2022    Procedure: REPAIR AND RECONSTRUCTION, FINGER, VOLAR PLATE, INTERPHALANGEAL JOINT;  Surgeon: Bonifacio Cordella HERO, MD;  Location: MAIN OR [facility];  Service: Orthopedics    PR FREEING BOWEL ADHESION,ENTEROLYSIS N/A 11/01/2019    Procedure: Enterolysis (Separt Proc);  Surgeon: Aloysius Euell Quale, MD;  Location: MAIN OR [facility];  Service: Gastrointestinal    PR FUSION MC-P JT Left 08/27/2023    Procedure: ARTHRODESIS, METACARPOPHALANGEAL JOINT, WITH OR WITHOUT INTERNAL FIXATION;  Surgeon: Bonifacio Cordella HERO, MD;  Location: OR [facility];  Service: Orthopedics    PR ILEOSCOPY THRU STOMA,BIOPSY N/A 10/14/2019    Procedure: LYNETT; LORN 1/MX;  Surgeon: Thedora Alm Plain, MD;  Location: GI PROCEDURES [facility];  Service: Gastroenterology    PR PART REMOVAL COLON W COLOSTOMY Midline 09/02/2019    Procedure: COLECTOMY, PARTIAL; WITH SKIN LEVEL CECOSTOMY OR COLOSTOMY;  Surgeon: Aloysius Euell Quale, MD;  Location: MAIN OR [facility];  Service: Gastrointestinal    PR REPAIR INCISIONAL HERNIA,REDUCIBLE Midline 09/02/2019    Procedure: REPAIR INIT INCISIONAL OR VENTRAL HERNIA; REDUCIBLE;  Surgeon: Arnette Darice Burnet, MD;  Location: MAIN OR [facility];  Service: Trauma    PR UPPER GI ENDOSCOPY,BIOPSY N/A 01/07/2013    Procedure: UGI ENDOSCOPY; WITH BIOPSY, SINGLE OR MULTIPLE;  Surgeon: Liliane JENEANE Marker, MD;  Location:  GI PROCEDURES [facility];  Service: Gastroenterology    PR UPPER GI ENDOSCOPY,BIOPSY N/A 10/14/2019    Procedure: UGI ENDOSCOPY; WITH BIOPSY, SINGLE OR MULTIPLE;  Surgeon: Thedora Alm Plain, MD;  Location: GI PROCEDURES [facility];  Service: Gastroenterology    PR UPPER GI ENDOSCOPY,DIAGNOSIS N/A 10/01/2022    Procedure: UGI ENDO, INCLUDE ESOPHAGUS, STOMACH, & DUODENUM &/OR JEJUNUM; DX W/WO COLLECTION SPECIMN, BY BRUSH OR WASH;  Surgeon: Darrick Peck, MD;  Location: GI PROCEDURES [facility]  Service: Gastroenterology   [3]   Family History  Problem Relation Age of Onset    Breast cancer Mother     Diabetes Maternal Aunt     Colon cancer Maternal Grandmother     Prostate cancer Maternal Grandfather     Cancer Paternal Grandmother     Crohn's disease Neg Hx         Augustin Gladis Leech, MD  01/28/24 1537

## 2024-01-27 NOTE — Unmapped (Signed)
 BIB [facility] from home for RLQ pain and N/V that started this am. Pt thinks poss Crohn's flare, also has known hernia. Pt reports diarrhea (blood tinged) last night.

## 2024-01-27 NOTE — Unmapped (Signed)
 ED Progress Note  Patient care signed out to me at 5 PM shift change.  In brief, this is a 48 year old male with history of Crohn's disease complicated by prior small bowel obstructions who presents to the emergency department for severe abdominal pain as well as crampy pain with diarrhea.  Concern existed largely for potential Crohn's flare versus SBO.  He was given pain medications, basic labs are ordered, and a CT of his abdomen pelvis.  Shortly after shift change, the patient stated he would like to leave.  He informed me that his abdominal pain had completely resolved and he needed to leave and catch a bus.  His abdominal examination was benign, though he does have an impressive hernia at baseline.  He had normal bowel sounds, no tenderness, reported a bowel movement this morning, and appeared generally well.  I informed him that CT would be required to rule out obstruction, abscess, fistula, or other concerning or emergent pathology and his condition could worsen if this is not identified.  He expressed an understanding and would still like to leave.  He was discharged from the emergency department reporting no pain and feeling well.

## 2024-01-27 NOTE — Unmapped (Signed)
 Pt BIB [facility] for c/o RLQ pain with N/V beginning this morning. Reports possible Crohn's flare. VSS.

## 2024-02-01 NOTE — Unmapped (Signed)
 Late entry, due to lack of IV access, need to pivot to Tremfya .  Scripts sent and approval in place, patient received first shipment from [facility] 01/22/2024.

## 2024-02-02 ENCOUNTER — Ambulatory Visit: Admit: 2024-02-02 | Discharge: 2024-02-08 | Payer: Medicaid (Managed Care)

## 2024-02-02 ENCOUNTER — Ambulatory Visit
Admit: 2024-02-02 | Discharge: 2024-02-08 | Disposition: A | Discharge status: Home / Self Care | Payer: Medicaid (Managed Care)

## 2024-02-02 ENCOUNTER — Inpatient Hospital Stay
Admit: 2024-02-02 | Discharge: 2024-02-08 | Disposition: A | Discharge status: Home / Self Care | Payer: Medicaid (Managed Care)

## 2024-02-02 NOTE — Unmapped (Signed)
 Pt BIB [facility] from home for c/o abdominal pain with some SOB. Hx Crohn's. Seen on 06/25 for same.

## 2024-02-02 NOTE — Unmapped (Signed)
 Also reporting diarrhea for two days, weakness and lethargy

## 2024-02-03 LAB — GAMMA GT: GAMMA GLUTAMYL TRANSFERASE: 39 U/L (ref 0–73)

## 2024-02-03 LAB — COMPREHENSIVE METABOLIC PANEL
ALBUMIN: 3.1 g/dL — ABNORMAL LOW (ref 3.4–5.0)
ALKALINE PHOSPHATASE: 139 U/L — ABNORMAL HIGH (ref 46–116)
ALT (SGPT): 18 U/L (ref 10–49)
ANION GAP: 10 mmol/L (ref 5–14)
AST (SGOT): 13 U/L (ref <=34)
BILIRUBIN TOTAL: 0.3 mg/dL (ref 0.3–1.2)
BLOOD UREA NITROGEN: 10 mg/dL (ref 9–23)
BUN / CREAT RATIO: 9
CALCIUM: 8.8 mg/dL (ref 8.7–10.4)
CHLORIDE: 114 mmol/L — ABNORMAL HIGH (ref 98–107)
CO2: 24 mmol/L (ref 20.0–31.0)
CREATININE: 1.16 mg/dL (ref 0.73–1.18)
EGFR CKD-EPI (2021) MALE: 78 mL/min/1.73m2 (ref >=60)
GLUCOSE RANDOM: 100 mg/dL (ref 70–179)
POTASSIUM: 3.5 mmol/L (ref 3.4–4.8)
PROTEIN TOTAL: 6.4 g/dL (ref 5.7–8.2)
SODIUM: 148 mmol/L — ABNORMAL HIGH (ref 135–145)

## 2024-02-03 LAB — FERRITIN: FERRITIN: 197.3 ng/mL (ref 10.5–307.3)

## 2024-02-03 LAB — URINALYSIS WITH MICROSCOPY
BACTERIA: NONE SEEN /HPF
BILIRUBIN UA: NEGATIVE
BLOOD UA: NEGATIVE
GLUCOSE UA: NEGATIVE
KETONES UA: NEGATIVE
LEUKOCYTE ESTERASE UA: NEGATIVE
NITRITE UA: NEGATIVE
PH UA: 6.5 (ref 5.0–9.0)
PROTEIN UA: 30 — AB
RBC UA: 2 /HPF (ref <=3)
SPECIFIC GRAVITY UA: 1.029 (ref 1.003–1.030)
SQUAMOUS EPITHELIAL: 3 /HPF (ref 0–5)
UROBILINOGEN UA: <2
WBC UA: 4 /HPF — ABNORMAL HIGH (ref <=2)

## 2024-02-03 LAB — CBC W/ AUTO DIFF
BASOPHILS ABSOLUTE COUNT: 0 10*9/L (ref 0.0–0.1)
BASOPHILS RELATIVE PERCENT: 0.5 %
EOSINOPHILS ABSOLUTE COUNT: 0 10*9/L (ref 0.0–0.5)
EOSINOPHILS RELATIVE PERCENT: 0.6 %
HEMATOCRIT: 36.1 % — ABNORMAL LOW (ref 39.0–48.0)
HEMOGLOBIN: 11.9 g/dL — ABNORMAL LOW (ref 12.9–16.5)
LYMPHOCYTES ABSOLUTE COUNT: 1.4 10*9/L (ref 1.1–3.6)
LYMPHOCYTES RELATIVE PERCENT: 24 %
MEAN CORPUSCULAR HEMOGLOBIN CONC: 33 g/dL (ref 32.0–36.0)
MEAN CORPUSCULAR HEMOGLOBIN: 28.2 pg (ref 25.9–32.4)
MEAN CORPUSCULAR VOLUME: 85.3 fL (ref 77.6–95.7)
MEAN PLATELET VOLUME: 7.2 fL (ref 6.8–10.7)
MONOCYTES ABSOLUTE COUNT: 0.7 10*9/L (ref 0.3–0.8)
MONOCYTES RELATIVE PERCENT: 11.8 %
NEUTROPHILS ABSOLUTE COUNT: 3.6 10*9/L (ref 1.8–7.8)
NEUTROPHILS RELATIVE PERCENT: 63.1 %
PLATELET COUNT: 221 10*9/L (ref 150–450)
RED BLOOD CELL COUNT: 4.23 10*12/L — ABNORMAL LOW (ref 4.26–5.60)
RED CELL DISTRIBUTION WIDTH: 18.7 % — ABNORMAL HIGH (ref 12.2–15.2)
WBC ADJUSTED: 5.7 10*9/L (ref 3.6–11.2)

## 2024-02-03 LAB — HIGH SENSITIVITY TROPONIN I - SINGLE: HIGH SENSITIVITY TROPONIN I: 3 ng/L (ref <=53)

## 2024-02-03 LAB — MAGNESIUM: MAGNESIUM: 1.5 mg/dL — ABNORMAL LOW (ref 1.6–2.6)

## 2024-02-03 LAB — SEDIMENTATION RATE: ERYTHROCYTE SEDIMENTATION RATE: 23 mm/h — ABNORMAL HIGH (ref 0–15)

## 2024-02-03 LAB — C-REACTIVE PROTEIN: C-REACTIVE PROTEIN: 50.4 mg/L — ABNORMAL HIGH (ref <=10.0)

## 2024-02-03 MED ADMIN — HYDROmorphone (DILAUDID) injection Syrg 0.5 mg: 0.5 mg | INTRAVENOUS | @ 06:00:00 | Stop: 2024-02-03

## 2024-02-03 MED ADMIN — HYDROmorphone (DILAUDID) injection Syrg 0.5 mg: 0.5 mg | INTRAVENOUS | @ 09:00:00 | Stop: 2024-02-03

## 2024-02-03 MED ADMIN — HYDROmorphone (PF) (DILAUDID) injection 1 mg: 1 mg | INTRAVENOUS | @ 23:00:00 | Stop: 2024-02-03

## 2024-02-03 MED ADMIN — HYDROmorphone (PF) (DILAUDID) injection 1 mg: 1 mg | INTRAVENOUS | @ 13:00:00 | Stop: 2024-02-03

## 2024-02-03 MED ADMIN — prochlorperazine (COMPAZINE) injection 5 mg: 5 mg | INTRAVENOUS | @ 05:00:00 | Stop: 2024-02-03

## 2024-02-03 MED ADMIN — HYDROmorphone (DILAUDID) injection Syrg 0.5 mg: 0.5 mg | INTRAVENOUS | @ 05:00:00 | Stop: 2024-02-03

## 2024-02-03 MED ADMIN — lactated ringers bolus 1,000 mL: 1000 mL | INTRAVENOUS | @ 10:00:00 | Stop: 2024-02-03

## 2024-02-03 MED ADMIN — magnesium sulfate in D5W 1 gram/100 mL infusion 1 g: 1 g | INTRAVENOUS | @ 09:00:00 | Stop: 2024-02-03

## 2024-02-03 MED ADMIN — HYDROmorphone (PF) (DILAUDID) injection 1 mg: 1 mg | INTRAVENOUS | @ 17:00:00 | Stop: 2024-02-03

## 2024-02-03 MED ADMIN — ondansetron (ZOFRAN) injection 4 mg: 4 mg | INTRAVENOUS | @ 17:00:00 | Stop: 2024-02-03

## 2024-02-03 MED ADMIN — prochlorperazine (COMPAZINE) injection 10 mg: 10 mg | INTRAVENOUS | @ 21:00:00 | Stop: 2024-02-03

## 2024-02-03 MED ADMIN — diphenhydrAMINE (BENADRYL) capsule/tablet 25 mg: 25 mg | ORAL | @ 06:00:00 | Stop: 2024-02-03

## 2024-02-03 MED ADMIN — diphenhydrAMINE (BENADRYL) injection 25 mg: 25 mg | INTRAVENOUS | @ 21:00:00 | Stop: 2024-02-03

## 2024-02-03 NOTE — Unmapped (Signed)
 [facility]  Emergency Department Provider Note     ED Clinical Impression     Final diagnoses:   Crohn's disease of colon without complication    (Primary)        Impression, Medical Decision Making, ED Course     12:07 AM   Impression: 48 y.o. male with a past medical history of Crohn's disease s/p hemicolectomy, COPD, DVT (not on AC), bipolar disorder who presents with abdominal pain as described below. Upon arrival, VS WNL.    DDx/MDM: Left lower abdominal pain in a patient with known history of complicated Crohn's, not currently on any medication, frequent ED visits for similar.  Reports worsening symptoms over the last 24 hours.  No nausea, vomiting but does report loose stools.  States this feels consistent with previous Crohn's flares.  Also reports a brief episode of chest pain earlier today.  Differential diagnosis includes Crohn's flare, potential complicating abscess or stricture, diverticulitis, colitis.  Did report brief episode of chest pain, concern for arrhythmia, lecture abnormality, ACS.    Plan for basic labs, inflammatory markers, troponin, CT abdomen, GI pathogen panel.      ED Course as of 02/04/24 0400   Wed Feb 03, 2024   0110 WBC: 5.7   0111 Given patient has had 6 CT scans in the last 2 months with relatively consistent findings and he states today feels like a typical Crohn's flare without any fever, leukocytosis, will hold on ordering CT imaging at this time as suspicion for abscess or other acute intra-abdominal process is relatively low and given recent imaging approximately 2 weeks ago.  Patient does state this feels very similar to his typical Crohn's flares.  Would prefer to hold on further imaging and radiation exposure in a relatively young patient, however, would have a low threshold to further image if any sort of concerning findings or complications arise.   0217 CRP(!): 50.4   0217 Sed Rate(!): 23   0309 On reassessment, patient is resting comfortably.  Discussed findings with elevated inflammatory markings and recent CT suggestive of colitis.  Would recommend admission for further management of these.  Given patient is a stable at this time and resting comfortably without any significant pain, will defer management of potential antibiotics and steroids to inpatient team.   0415 Magnesium (!): 1.5   0618 On reassessment, patient is complaining of continued abdominal pain.  There was discussion with overnight team that if patient could be successfully symptomatically managed on the emergency department they may be able to be discharged following discussion with GI medicine regarding ongoing medication regimen.  Given they are having continued pain in the setting of elevated inflammatory markers and inability to tolerate p.o., will reach out to [facility] again regarding potential further recommendations.       ____________________________________________    The case was discussed with the attending physician, who is in agreement with the above assessment and plan.      History     Chief Complaint  Chief Complaint   Patient presents with    Abdominal Pain       HPI   Jerome Kennedy. is a 48 y.o. male with past medical history as below who presents with abdominal pain.  Patient reports worsening left lower abdominal pain since yesterday.  He states this feels consistent with previous Crohn's flare.  No recent fevers or chills.  He reports 3-4 episodes of loose stool.  Denies any dark, tarry, bloody stool.  Patient states he  is not currently on any prednisone  or other medications for Crohn's.  However, there has been discussion about him starting Skyrizi. He also reports a brief episode of chest pain at approximately 1800 today.  Denies shortness of breath. Patient reports allergies to morphine  and Toradol.  Endorses tobacco use.  Denies alcohol or other drug use.        Past Medical History[1]    Past Surgical History[2]      Current Facility-Administered Medications:     acetaminophen  (TYLENOL ) tablet 1,000 mg, 1,000 mg, Oral, TID, Fuller, Jessica S, MD, 1,000 mg at 02/03/24 2338    albuterol  (PROVENTIL  HFA;VENTOLIN  HFA) 90 mcg/actuation inhaler 2 puff, 2 puff, Inhalation, Q6H PRN, Melba Harlene RAMAN, MD    cariprazine  (VRAYLAR ) capsule 1.5 mg, 1.5 mg, Oral, Daily, Fuller, Jessica S, MD    cholecalciferol  (vitamin D3 25 mcg (1,000 units)) tablet 25 mcg, 25 mcg, Oral, Daily, Melba Harlene RAMAN, MD    dicyclomine  (BENTYL ) capsule 10 mg, 10 mg, Oral, QID PRN, Fuller, Jessica S, MD    enoxaparin  (LOVENOX ) syringe 40 mg, 40 mg, Subcutaneous, Nightly, Melba Harlene RAMAN, MD, 40 mg at 02/03/24 2337    ferrous sulfate  tablet 325 mg, 325 mg, Oral, Daily, Melba Harlene RAMAN, MD    fluticasone  furoate-vilanterol (BREO ELLIPTA ) 100-25 mcg/dose inhaler 1 puff, 1 puff, Inhalation, Daily (RT), Melba Harlene RAMAN, MD    HYDROmorphone  (DILAUDID ) injection Syrg 0.5 mg, 0.5 mg, Intravenous, Q4H PRN, Melba Harlene RAMAN, MD    lactated Ringers  infusion, 100 mL/hr, Intravenous, Continuous, Melba Harlene RAMAN, MD, Last Rate: 100 mL/hr at 02/04/24 0004, 100 mL/hr at 02/04/24 0004    melatonin tablet 3 mg, 3 mg, Oral, Nightly PRN, Melba Harlene RAMAN, MD    nicotine  (NICODERM CQ ) 21 mg/24 hr patch 1 patch, 1 patch, Transdermal, Daily, Melba Harlene RAMAN, MD, 1 patch at 02/03/24 2335    ondansetron  (ZOFRAN ) injection 4 mg, 4 mg, Intravenous, Q8H PRN, 4 mg at 02/03/24 2338 **OR** ondansetron  (ZOFRAN ) injection 8 mg, 8 mg, Intravenous, Q8H PRN, Melba Harlene RAMAN, MD    oxyCODONE  (ROXICODONE ) immediate release tablet 5 mg, 5 mg, Oral, Q4H PRN, Fuller, Jessica S, MD, 5 mg at 02/03/24 2338    pantoprazole  (Protonix ) EC tablet 40 mg, 40 mg, Oral, Daily, Melba Harlene RAMAN, MD    prochlorperazine  (COMPAZINE ) injection 2.5 mg, 2.5 mg, Intravenous, Q8H PRN **OR** prochlorperazine  (COMPAZINE ) injection 5 mg, 5 mg, Intravenous, Q8H PRN, Melba Harlene RAMAN, MD    QUEtiapine  (SEROQUEL ) tablet 100 mg, 100 mg, Oral, Nightly, Melba Harlene RAMAN, MD, 100 mg at 02/03/24 2337    Allergies  Morphine  and Toradol [ketorolac]    Family History  Family History[3]    Social History  Short Social History[4]     Physical Exam     VITAL SIGNS:      Vitals:    02/04/24 0013 02/04/24 0014 02/04/24 0130 02/04/24 0143   BP: 92/62  100/61    Pulse: 70 74 83    Resp:  12 14    Temp: 36.6 ??C (97.8 ??F)  36.4 ??C (97.5 ??F)    TempSrc:   Oral    SpO2:   94%    Weight:    68.9 kg (152 lb)   Height:    172.7 cm (5' 7.99)       Constitutional: Alert and oriented. No acute distress.  Eyes: Conjunctivae are normal.  HEENT: Normocephalic and atraumatic. Conjunctivae clear. No congestion. Moist mucous membranes.   Cardiovascular:  Rate as above, regular rhythm. Normal and symmetric distal pulses. Brisk capillary refill. Normal skin turgor.  Respiratory: Normal respiratory effort. Breath sounds are normal. There are no wheezing or crackles heard.  Gastrointestinal: Soft, non-distended, left lower quadrant tenderness to palpation.  Large periumbilical hernia that is soft.  No overlying skin changes.  Genitourinary: Deferred.  Musculoskeletal: Non-tender with normal range of motion in all extremities.  Neurologic: Normal speech and language. No gross focal neurologic deficits are appreciated. Patient is moving all extremities equally, face is symmetric at rest and with speech.  Skin: Skin is warm, dry and intact. No rash noted.  Psychiatric: Mood and affect are normal. Speech and behavior are normal.     Radiology     XR Abdomen 1 View    (Results Pending)       Pertinent labs & imaging results that were available during my care of the patient were independently interpreted by me and considered in my medical decision making (see chart for details).    Portions of this record have been created using Scientist, clinical (histocompatibility and immunogenetics). Dictation errors have been sought, but may not have been identified and corrected.           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    Myalgia     Rhinovirus infection 10/29/2022    SBO (small bowel obstruction)    2011   [2]   Past Surgical History:  Procedure Laterality Date    CHOLECYSTECTOMY      COLON SURGERY      partial resection    HERNIA REPAIR      PR ARTHRP INTERCARPAL/CARP/MTCRPL JT INTERPOSITION Left 08/07/2022    Procedure: INTERPOSIT ARTHROPLASTY-INTERCARPAL/CARPOMETACAR;  Surgeon: Bonifacio Cordella HERO, MD;  Location: MAIN OR [facility];  Service: Orthopedics    PR CLOSE ENTEROSTOMY,RESEC+COLOREC ANAS Midline 11/01/2019    Procedure: CLO ENTEROSTOMY; W/RESECT AUGIE FLAKES;  Surgeon: Aloysius Euell Quale, MD;  Location: MAIN OR [place];  Service: Gastrointestinal    PR COLONOSCOPY FLX DX W/COLLJ SPEC WHEN PFRMD Left 01/07/2013    Procedure: COLONOSCOPY, FLEXIBLE, PROXIMAL TO SPLENIC FLEXURE; DIAGNOSTIC, W/WO COLLECTION SPECIMEN BY BRUSH OR WASH;  Surgeon: Liliane JENEANE Marker, MD;  Location: GI PROCEDURES [facility];  Service: Gastroenterology    PR COLONOSCOPY FLX DX W/COLLJ SPEC WHEN PFRMD  03/09/2015    Procedure: COLONOSCOPY, FLEXIBLE, PROXIMAL TO SPLENIC FLEXURE; DIAGNOSTIC, W/WO COLLECTION SPECIMEN BY BRUSH OR WASH;  Surgeon: Artist Jayson Leather, MD;  Location: GI PROCEDURES [facility];  Service: Gastroenterology    PR COLONOSCOPY FLX DX W/COLLJ SPEC WHEN PFRMD N/A 10/28/2019    Procedure: COLONOSCOPY, FLEXIBLE, PROXIMAL TO SPLENIC FLEXURE; DIAGNOSTIC, W/WO COLLECTION SPECIMEN BY BRUSH OR WASH;  Surgeon: Derick Bring, MD;  Location: GI PROCEDURES MEM[facility];  Service: Gastroenterology    PR COLONOSCOPY FLX DX W/COLLJ SPEC WHEN PFRMD N/A 10/01/2022    Procedure: COLONOSCOPY, FLEXIBLE, PROXIMAL TO SPLENIC FLEXURE; DIAGNOSTIC,  W/WO COLLECTION SPECIMEN BY BRUSH OR WASH;  Surgeon: Darrick Peck, MD;  Location: GI PROCEDURES MEMORIAL [facility];  Service: Gastroenterology    PR COLONOSCOPY FLX DX W/COLLJ SPEC WHEN PFRMD N/A 11/30/2023    Procedure: COLONOSCOPY, FLEXIBLE, PROXIMAL TO SPLENIC FLEXURE; DIAGNOSTIC, W/WO COLLECTION SPECIMEN BY BRUSH OR WASH;  Surgeon: Audrey Lacinda Hun, MD;  Location: GI PROCEDURES [facility];  Service: Gastroenterology    PR EXPLORATORY OF ABDOMEN Midline 09/02/2019    Procedure: EXPLORATORY LAPAROTOMY, EXPLORATORY CELIOTOMY WITH OR WITHOUT BIOPSY(S);  Surgeon: Aloysius Euell Quale, MD;  Location: MAIN OR [facility];  Service: Gastrointestinal    PR EXPLORATORY OF ABDOMEN Midline 11/01/2019    Procedure: EXPLORATORY LAPAROTOMY, EXPLORATORY CELIOTOMY WITH OR WITHOUT BIOPSY(S);  Surgeon: Aloysius Euell Quale, MD;  Location: MAIN OR [facility];  Service: Gastrointestinal    PR FIX FINGER,VOLAR PLATE,I-P JT Left 08/07/2022    Procedure: REPAIR AND RECONSTRUCTION, FINGER, VOLAR PLATE, INTERPHALANGEAL JOINT;  Surgeon: Bonifacio Cordella HERO, MD;  Location: MAIN OR [facility];  Service: Orthopedics    PR FREEING BOWEL ADHESION,ENTEROLYSIS N/A 11/01/2019    Procedure: Enterolysis (Separt Proc);  Surgeon: Aloysius Euell Quale, MD;  Location: MAIN OR [facility];  Service: Gastrointestinal    PR FUSION MC-P JT Left 08/27/2023    Procedure: ARTHRODESIS, METACARPOPHALANGEAL JOINT, WITH OR WITHOUT INTERNAL FIXATION;  Surgeon: Bonifacio Cordella HERO, MD;  Location: OR [facility];  Service: Orthopedics    PR ILEOSCOPY THRU STOMA,BIOPSY N/A 10/14/2019    Procedure: LYNETT; LORN 1/MX;  Surgeon: Thedora Alm Plain, MD;  Location: GI PROCEDURES MEMORIAL [facility];  Service: Gastroenterology    PR PART REMOVAL COLON W COLOSTOMY Midline 09/02/2019    Procedure: COLECTOMY, PARTIAL; WITH SKIN LEVEL CECOSTOMY OR COLOSTOMY;  Surgeon: Aloysius Euell Quale, MD;  Location: MAIN OR [facility];  Service: Gastrointestinal    PR REPAIR INCISIONAL HERNIA,REDUCIBLE Midline 09/02/2019    Procedure: REPAIR INIT INCISIONAL OR VENTRAL HERNIA; REDUCIBLE;  Surgeon: Arnette Darice Burnet, MD;  Location: MAIN OR [place];  Service: Trauma    PR UPPER GI ENDOSCOPY,BIOPSY N/A 01/07/2013    Procedure: UGI ENDOSCOPY; WITH BIOPSY, SINGLE OR MULTIPLE;  Surgeon: Liliane JENEANE Marker, MD;  Location: GI PROC[facility]URES [facility];  Service: Gastroenterology    PR UPPER GI ENDOSCOPY,BIOPSY N/A 10/14/2019    Procedure: UGI ENDOSCOPY; WITH BIOPSY, SINGLE OR MULTIPLE;  Surgeon: Thedora Alm Plain, MD;  Location: GI PROCEDURES [facility]OR[facility];  Service: Gastroenterology    PR UPPER GI ENDOSCOPY,DIAGNOSIS N/A 10/01/2022    Procedure: UGI ENDO, INCLUDE ESOPHAGUS, STOMACH, & DUODENUM &/OR JEJUNUM; DX W/WO COLLECTION SPECIMN, BY BRUSH OR WASH;  Surgeon: Darrick Peck, MD;  Location: GI PROCEDURES [facility]ORIAL [facility];  Service: Gastroenterology   [3]   Family History  Problem Relation Age of Onset    Breast cancer Mother     Diabetes Maternal Aunt     Colon cancer Maternal Grandmother     Prostate cancer Maternal Grandfather     Cancer Paternal Grandmother     Crohn's disease Neg Hx    [4]   Social History  Tobacco Use    Smoking status: Every Day     Current packs/day: 1.00     Average packs/day: 0.6 packs/day for 22.3 years (12.8 ttl pk-yrs)     Types: Cigarettes     Start date: 10/02/2001     Passive exposure: Current    Smokeless tobacco: Never    Tobacco comments:     1ppd   Vaping Use    Vaping status: Some Days  Substances: Nicotine     Passive vaping exposure: Yes   Substance Use Topics    Alcohol use: No     Alcohol/week: 0.0 standard drinks of alcohol    Drug use: Yes     Types: Marijuana     Comment: Smokes 2-3 joints/day        Teresa Rochele BRAVO, MD  Resident  02/04/24 0400

## 2024-02-03 NOTE — Unmapped (Addendum)
 [facility] Medicine   History and Physical       Assessment and Plan     Jerome Matsuoka. is a 48 y.o. male who is presenting to [facility] with Exacerbation of Crohn's disease   , in the setting of the following pertinent/contributing co-morbidities: COPD, bipolar disorder, polysubstance use.      Exacerbation of Crohn's disease      Chronic illness with severe exacerbation or progression that has a significant risk of morbidity without appropriate treatment  History of Crohn's disease of small and large intestine (dx 1990's) s/p ileocecectomy (2003) c/b SBO 2/2 to stricturing disease s/p right hemicolectomy and end-ileostomy (08/2019) complicated by short gut syndrome and high ostomy output s/p takedown and reanastomosis (10/2019), presenting with 1 day of increased pain, vomiting, and diarrhea along with elevated ESR/CRP consistent with possible Crohn's flare vs chronically poorly controlled symptoms due to difficulty with medication compliance (uncertain if he completed recent prednisone  taper appropriately).  He does have a history of Cdiff so this should be ruled out. His hernias are soft and reducible.  Symptoms less consistent with obstruction given still passing stool with active bowel sounds and no distension, but consider imaging if not improving.  First dose of Tremfya  completed 6/28.  - follow up fecal calprotectin, cdiff, and GIPP  - Obtain KUB-CT if any concern for ileus or obstruction as he has had stricturing disease in the past  - UA and bladder scan to rule out GU issues given lower quadrant pain  -GI consult in the morning  - Will not restart prednisone  taper at this time given uncertain exactly what dose he would currently be taking or if he completed therapy  - Continue scheduled acetaminophen , as needed oxycodone  and Dilaudid   - enoxaparin  for DVT prophylaxis        Secondary/Additional Active Problems:  Hypoxemia - not documented but placed on O2 2L in ED.  On my exam, weaned to RA and O2 sats 94-96%, which is more than adequate in setting of his COPD.  Suspect this in addition to possible hypoventilation after opiate dosing.   - wean for goal 88-92% - if ongoing true O2 requirement, consider CT chest PE protocol given history of prior DVT and not on anticoagulation    Hypernatremia - mild, asymptomatic, suspect due to poor p.o. intake and vomiting.  Maintenance fluids overnight, repeat in the morning.    Hypomagnesemia -suspect due to poor p.o. intake, vomiting, diarrhea.  Status post 1 g IV, repeat in the morning    Normocytic anemia -suspect due to above, but also at risk for iron  deficiency.  Baseline hemoglobin 12-14, close to baseline at 11.9 at presentation.  Will add on iron  studies, meets criteria for IV repletion if low.  - Continue home p.o. iron  supplement    Elevated alk phos -without abnormalities and other LFTs.  Add on GGT, if elevated consider right upper quadrant ultrasound.    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 He is not on lifelong anti-coagulation on advice of Heme/Onc due to bleeding risk with his IBD (see note from 2022 [facility] hospitalization). He is at elevated risk for clot in the setting of Crohn's flare.   - DVT ppx as above      Bipolar disorder   - Continue home Vraylar  1.5 mg daily  -  Continue home Seroquel  100 mg daily         Prophylaxis  - Enoxaparin     Diet  -Nutrition Therapy Regular/House    Code Status / HCDM  -Full Code, Unverified but presumed, based on previous history   -  HCDM (patient stated preference): Jerome Kennedy, Jerome Kennedy - Mother - [phone]    Anticipated Medically Ready for Discharge: Anticipated in 2-4 Days    Significant Comorbid Conditions:  -Anemia POA requiring further investigation or monitoring  Hypernatremia present on arrival requiring further investigation, treatment, or monitoring. Hypomagnesemia present on arrival requiring further investigation, treatment, or monitoring    Issues Impacting Complexity of Management:  -Parenteral controlled medications: IV Dilaudid     Medical Decision Making: Reviewed records from the following unique sources prior admissions and GI outpatient notes.        HPI      Jerome Eden. is a 48 y.o. male who is presenting to [facility] with Exacerbation of Crohn's disease   .    53 yoM with PMHx of Crohn's disease of small and large intestine (dx 1990's) s/p ileocecectomy (2003) c/b SBO 2/2 to stricturing disease s/p right hemicolectomy and end-ileostomy (08/2019) complicated by short gut syndrome and high ostomy output s/p takedown and reanastomosis (10/2019), ventral abdominal hernia and incisional hernia s/p repair (2004) with recurrence, recurrent CDI (s/p PO vancomycin  04/2022), , polysubstance abuse (cocaine and marijuana) who presents with 1 day of worsening abdominal pain, vomiting, and diarrhea.    He states he was doing well and at his baseline health and yesterday he noticed increasing abdominal pain in the bilateral lower quadrants with associated nausea and vomiting.  He vomited multiple times yesterday and today in the waiting room yellow to dark green material.  He also notes looser stools, has had 4 loose watery stools in the last 24 hours without blood.  Symptoms have been associated with chills, but no measured fever, no recent travel, no sick contacts, no cough, no shortness of breath, no chest pain, no oral ulcers, no joint pains, no rash, no headache, no vision changes.  He was recently admitted for a flare and discharged 5/29 with a prolonged prednisone  taper which appears to be ongoing based on prescription but he endorses completing this therapy already.  He recently transition to Tremfya  with first dose given 6/28.  This pain feels similar to his prior Crohn's flares.  His hernia has remained soft and reducible throughout.    ER course was notable for afebrile, heart rate 72-91, blood pressure 90s over 60s, ? desaturation though not documented.  Labs notable for normal white count, hemoglobin stable 11.9, sed rate increased from prior at 23 from 17, CRP increased from prior at 50.4 from 23.3, sodium elevated at 148 chloride elevated at 114, creatinine at baseline, magnesium  low at 1.5, AST/ALT/bilirubin normal, alk phos mildly elevated, troponin normal.  No imaging was obtained given he has had multiple CT scans over the last several months and symptoms are consistent with his prior Crohn's flares.      Med Rec Confidence   I reviewed the Medication List. The current list is Accurate.  My only question is the prednisone  taper as it appears he should still be taking it but he reports having completed already and no longer taking Bactrim .    Physical Exam   Temp:  [36.4 ??C (97.5 ??F)] 36.4 ??C (97.5 ??F)  Pulse:  [69-89] 69  SpO2 Pulse:  [69-84] 69  Resp:  [8-21] 10  BP: (88-106)/(63-77)  94/68  SpO2:  [90 %-100 %] 93 %  There is no height or weight on file to calculate BMI.  Gen: alert, oriented, no acute distress though appears uncomfortable  HEENT: atraumatic, no scleral icterus, no conjunctival injection, no nasal drainage, moist mucus membranes.  No oropharyngeal erythema, no oral ulcers.  CV: regular rate and rhythm, no m/r/g, no peripheral edema  Resp: breathing comfortably on room air, speaking in full sentences, no increased work of breathing.  Clear to auscultation throughout with normal air movement.  Abdomen: nondistended, normal bowel sounds, soft, tender to palpation in bilateral lower quadrants without guarding/rebound.  Large hernias x2 are soft and easily reducible.  Skin: no rashes, no erythema, no bruising or petechiae  MSK/ext: warm, well perfused, no obvious trauma, no obvious joint swelling or erythema  Neuro: alert, oriented x4, EOMI, face symmetric, no dysarthria.  Answers questions appropriately.  Moves all four spontaneously with purpose. No tremor or abnormal movements. Tone normal.  Psych: calm, appropriate for situation

## 2024-02-03 NOTE — Unmapped (Signed)
Bed: 02-P  Expected date:   Expected time:   Means of arrival:   Comments:  No bed

## 2024-02-03 NOTE — Unmapped (Signed)
 Received sign out from Dr. Marcell    ED I-PASS Handoff  Illness Severit: stable  Patient Summary: Jerome Kennedy. is a 48 y.o. male with a PMH of Crohn's disease s/p hemicolectomy, COPD, DVT (not on [facility]), bipolar disorder who presents with 1 day of worsening abdominal pain.  Vital signs are within normal limits.  On workup thus far, sedimentation rate elevated to 23, magnesium  decreased to 1.5, CMP elevated sodium to 148 and alk phos elevated to 139, c-reactive protein elevated to 50.4, and CBC Hgb decreased to 11.9. Troponin unremarkable.  On imaging thus far, EKG unremarkable.  Action List: Awaiting GI pathogen panel. Awaiting admission  Situation Awareness (Contingency Planning): Anticipate admission.    During my stay patient intermittently requesting additional pain meds or nausea meds however remains very stable with reassuring abdominal exam (large easily reducible non tender ventral hernia)., tolerating PO. Signed out to oncoming provider at [facility] still awaiting admission team/bed.       Documentation assistance was provided by Schuyler Gibney, Scribe on February 03, 2024 at 6:55 AM for Oneil Mitchell, MD.    Documentation assistance was provided by the scribe in my presence.  The documentation recorded by the scribe has been reviewed by me and accurately reflects the services I personally performed.      Note has been documented by [facility] A. Grahn on 02/03/2024

## 2024-02-04 LAB — CBC
HEMATOCRIT: 36.4 % — ABNORMAL LOW (ref 39.0–48.0)
HEMOGLOBIN: 12.2 g/dL — ABNORMAL LOW (ref 12.9–16.5)
MEAN CORPUSCULAR HEMOGLOBIN CONC: 33.5 g/dL (ref 32.0–36.0)
MEAN CORPUSCULAR HEMOGLOBIN: 28.6 pg (ref 25.9–32.4)
MEAN CORPUSCULAR VOLUME: 85.4 fL (ref 77.6–95.7)
MEAN PLATELET VOLUME: 7.7 fL (ref 6.8–10.7)
PLATELET COUNT: 183 10*9/L (ref 150–450)
RED BLOOD CELL COUNT: 4.26 10*12/L (ref 4.26–5.60)
RED CELL DISTRIBUTION WIDTH: 18.4 % — ABNORMAL HIGH (ref 12.2–15.2)
WBC ADJUSTED: 5.8 10*9/L (ref 3.6–11.2)

## 2024-02-04 LAB — COMPREHENSIVE METABOLIC PANEL
ALBUMIN: 3.1 g/dL — ABNORMAL LOW (ref 3.4–5.0)
ALKALINE PHOSPHATASE: 140 U/L — ABNORMAL HIGH (ref 46–116)
ALT (SGPT): 14 U/L (ref 10–49)
ANION GAP: 11 mmol/L (ref 5–14)
AST (SGOT): 14 U/L (ref <=34)
BILIRUBIN TOTAL: 0.2 mg/dL — ABNORMAL LOW (ref 0.3–1.2)
BLOOD UREA NITROGEN: 9 mg/dL (ref 9–23)
BUN / CREAT RATIO: 9
CALCIUM: 8.2 mg/dL — ABNORMAL LOW (ref 8.7–10.4)
CHLORIDE: 111 mmol/L — ABNORMAL HIGH (ref 98–107)
CO2: 23 mmol/L (ref 20.0–31.0)
CREATININE: 0.99 mg/dL (ref 0.73–1.18)
EGFR CKD-EPI (2021) MALE: >90 mL/min/1.73m2 (ref >=60)
GLUCOSE RANDOM: 86 mg/dL (ref 70–179)
POTASSIUM: 3.3 mmol/L — ABNORMAL LOW (ref 3.4–4.8)
PROTEIN TOTAL: 6.3 g/dL (ref 5.7–8.2)
SODIUM: 145 mmol/L (ref 135–145)

## 2024-02-04 LAB — IRON PANEL
IRON SATURATION: 13 % — ABNORMAL LOW (ref 20–55)
IRON: 30 ug/dL — ABNORMAL LOW (ref 65–175)
TOTAL IRON BINDING CAPACITY: 237 ug/dL — ABNORMAL LOW (ref 250–425)

## 2024-02-04 LAB — MAGNESIUM: MAGNESIUM: 1.7 mg/dL (ref 1.6–2.6)

## 2024-02-04 MED ADMIN — nicotine (NICODERM CQ) 21 mg/24 hr patch 1 patch: 1 | TRANSDERMAL | @ 13:00:00

## 2024-02-04 MED ADMIN — acetaminophen (TYLENOL) tablet 1,000 mg: 1000 mg | ORAL | @ 22:00:00

## 2024-02-04 MED ADMIN — HYDROmorphone (DILAUDID) injection Syrg 0.5 mg: .5 mg | INTRAVENOUS | @ 17:00:00 | Stop: 2024-02-17

## 2024-02-04 MED ADMIN — oxyCODONE (ROXICODONE) immediate release tablet 5 mg: 5 mg | ORAL | @ 22:00:00 | Stop: 2024-02-17

## 2024-02-04 MED ADMIN — lactated Ringers infusion: 100 mL/h | INTRAVENOUS | @ 04:00:00 | Stop: 2024-02-04

## 2024-02-04 MED ADMIN — ferrous sulfate tablet 325 mg: 325 mg | ORAL | @ 13:00:00

## 2024-02-04 MED ADMIN — acetaminophen (TYLENOL) tablet 1,000 mg: 1000 mg | ORAL | @ 04:00:00

## 2024-02-04 MED ADMIN — ondansetron (ZOFRAN) injection 4 mg: 4 mg | INTRAVENOUS | @ 04:00:00

## 2024-02-04 MED ADMIN — cholecalciferol (vitamin D3 25 mcg (1,000 units)) tablet 25 mcg: 25 ug | ORAL | @ 13:00:00

## 2024-02-04 MED ADMIN — QUEtiapine (SEROQUEL) tablet 100 mg: 100 mg | ORAL | @ 04:00:00

## 2024-02-04 MED ADMIN — fluticasone furoate-vilanterol (BREO ELLIPTA) 100-25 mcg/dose inhaler 1 puff: 1 | RESPIRATORY_TRACT | @ 13:00:00

## 2024-02-04 MED ADMIN — enoxaparin (LOVENOX) syringe 40 mg: 40 mg | SUBCUTANEOUS | @ 04:00:00

## 2024-02-04 MED ADMIN — acetaminophen (TYLENOL) tablet 1,000 mg: 1000 mg | ORAL | @ 13:00:00

## 2024-02-04 MED ADMIN — cariprazine (VRAYLAR) capsule 1.5 mg: 1.5 mg | ORAL | @ 13:00:00

## 2024-02-04 MED ADMIN — nicotine (NICODERM CQ) 21 mg/24 hr patch 1 patch: 1 | TRANSDERMAL | @ 04:00:00

## 2024-02-04 MED ADMIN — pantoprazole (Protonix) EC tablet 40 mg: 40 mg | ORAL | @ 13:00:00

## 2024-02-04 MED ADMIN — oxyCODONE (ROXICODONE) immediate release tablet 5 mg: 5 mg | ORAL | @ 11:00:00 | Stop: 2024-02-17

## 2024-02-04 MED ADMIN — ondansetron (ZOFRAN) injection 4 mg: 4 mg | INTRAVENOUS | @ 11:00:00

## 2024-02-04 MED ADMIN — oxyCODONE (ROXICODONE) immediate release tablet 5 mg: 5 mg | ORAL | @ 04:00:00 | Stop: 2024-02-17

## 2024-02-04 MED ADMIN — hydrOXYzine (ATARAX) tablet 25 mg: 25 mg | ORAL | @ 07:00:00 | Stop: 2024-02-04

## 2024-02-04 MED ADMIN — prochlorperazine (COMPAZINE) injection 2.5 mg: 2.5 mg | INTRAVENOUS | @ 17:00:00

## 2024-02-04 NOTE — Unmapped (Signed)
 Shift Summary  Abdominal pain was managed with HYDROmorphone , and pain level was assessed at 7.    prochlorperazine  was administered PRN in the early afternoon.    Safety devices were used for transfers in the afternoon to prevent falls.    Vitals remained stable throughout the shift, with BP at 122/74 and Temp at 37??C.    Overall, the patient maintained stability with no falls or injuries reported.     Absence of Fall and Fall-Related Injury: No falls or injuries occurred during the shift, and safety devices were used for transfers in the afternoon.     Optimal Pain Control and Function: Abdominal pain was reported as throbbing and chronic, with a pain level of 7, and HYDROmorphone  was administered PRN.

## 2024-02-04 NOTE — Unmapped (Signed)
 VENOUS ACCESS TEAM PROCEDURE    Nurse request was placed for a PIV by Venous Access Team (VAT).  Patient was assessed at bedside for placement of a PIV. PPE were donned per protocol.  Access was not obtained after 2 attempts with and without US  due to scar tissue and patient unable to tolerate the procedure. Team member will come for another look as requested by the unit charge nurse.    Workup / Procedure Time:  45 minutes       Care RN was notified.       Thank you,     Vickii KATHEE Mater, RN Venous Access Team

## 2024-02-04 NOTE — Unmapped (Signed)
 Attempted to place PIV for Pt but he only wants the PIV in certain area,unable to get the access at this time, will call for second look.

## 2024-02-04 NOTE — Unmapped (Addendum)
 Shift Summary  Oxygen saturation improved after intervention with nasal cannula.   enoxaparin  and acetaminophen  were administered in the [facility] Emergency Department.   Pain increased to 5, and oxyCODONE  was administered in the [facility] Emergency Department PRN.   Patient remained independent in activities of daily living and mobility.   Overall, the patient maintained stable vital signs and respiratory function.   Patient arrived at the floor , no complain of pain   Room air =  94 % , denies pain and sortness of breath  Had snacks / sandwich   Skin assessment done with Alchristian RN   Offer to change clothes to hospital gown but he refused  Complain of itchiness back and arms , given atarax  po.   Still for stool exam , pt informed.  Patient complain of pain  and nausea but BP is low 90/61 ( 72) mmhg , denies dizziness, offer tylenol  but he refused and he said he will wait .       Absence of Hospital-Acquired Illness or Injury: No hospital-acquired illness or injury was documented during the shift.     Optimal Comfort and Wellbeing: Comfort measures included maintaining the head of the bed at 60 degrees and ensuring the patient was supine.     Absence of Fall and Fall-Related Injury: Patient remained independent in mobility and toileting, with no falls or injuries reported.     Maintenance of COPD Symptom Control: Respiratory rate increased from 8 to 14 breaths per minute, and SpO2 improved from 91% to 94% by the end of the shift.     Optimal Pain Control and Function: Pain increased from 0 to 5, and oxyCODONE  was administered in the [facility] Emergency Department for pain management.

## 2024-02-04 NOTE — Unmapped (Signed)
 Hospital Medicine Daily Progress Note    Assessment/Plan:    Principal Problem:    Exacerbation of Crohn's disease     Active Problems:    Anemia    Hypomagnesemia    Bipolar 1 disorder       COPD (chronic obstructive pulmonary disease)       Hx of deep venous thrombosis    Tobacco use disorder    Hypernatremia                 Jerome Kennedy. is a 48 y.o. male that presented to [facility] [place] with Exacerbation of Crohn's disease   .      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  He does have a history of Cdiff so this should be ruled out. His hernias are soft and reducible.  Symptoms less consistent with obstruction given still passing stool with active bowel sounds and no distension, but consider imaging if not improving.  First dose of Tremfya  completed 6/28.  -Appreciate evaluation by GI  -KUB without evidence of obstruction  -Start Methylprednisolone  20mg  Q8hrs   -Fecal calprotectin, cdiff, and GIPP  -UA with 4WBC butnegative nitrite and LE  -Continue scheduled acetaminophen , as needed oxycodone  and Dilaudid   -Enoxaparin  for DVT prophylaxis  -Clear liquid diet, advance as tolerate  -Daily CRP     Secondary/Additional Active Problems:  Hypoxemia - not documented but placed on O2 2L in ED.  On my exam, weaned to RA and O2 sats 94-96%, which is more than adequate in setting of his COPD.  Suspect this in addition to possible hypoventilation after opiate dosing.   -Wean for goal 88-92% - if ongoing true O2 requirement, consider CT chest PE protocol given history of prior DVT and not on anticoagulation    Hypokalemia  -Likely secondary to poor PO intake  -Replace IV for now     Hypernatremia, resolved  -Suspect due to poor p.o. intake and vomiting  -Finish mIVF, push PO liquids     Hypomagnesemia, resolved  -Suspect due to poor p.o. intake, vomiting, diarrhea.    Normocytic anemia -suspect due to above, but also at risk for iron  deficiency.  Baseline hemoglobin 12-14, close to baseline at 11.9 at presentation.  Will add on iron  studies, meets criteria for IV repletion if low.  -Continue home p.o. iron  supplement     Elevated alk phos -without abnormalities and other LFTs.  Add on GGT, if elevated consider right upper quadrant ultrasound.     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 He is not on lifelong anti-coagulation on advice of Heme/Onc due to bleeding risk with his IBD (see note from 2022 [facility] hospitalization). He is at elevated risk for clot in the setting of Crohn's flare.   -DVT ppx as above      Bipolar disorder   -Continue home Vraylar  1.5 mg daily  -  Continue home Seroquel  100 mg daily       Prophylaxis  - Enoxaparin      Diet  -Nutrition Therapy Regular/House    I personally spent 40 minutes face-to-face and non-face-to-face in the care of this patient, which includes all pre, intra, and post visit time on the date of service.  All documented time was specific to the E/M visit and does not include any procedures that may have been performed.    ___________________________________________________________________    Subjective:  Continues to feel nauseated, with abdominal discomfort, later RN stated that patient did have episode of emesis.    Objective:  Temp:  [36.4 ??C (97.5 ??F)-37 ??C (98.6 ??F)] 37 ??C (98.6 ??F)  Pulse:  [69-83] 81  SpO2 Pulse:  [69-83] 72  Resp:  [8-18] 18  BP: (86-122)/(58-74) 122/74  SpO2:  [91 %-100 %] 100 %    GEN: NAD, lying in bed  EYES: EOMI  ENT: MMM  CV: RRR  PULM: CTA B  ABD: soft, ND, +BS, tender to palpation in the lower quadrants bilaterally, large hernias that are easily reducible  EXT: No edema  NEURO: A&Ox4

## 2024-02-04 NOTE — Unmapped (Addendum)
 Jerome Francis Jr., a 48 year old male with a history of Crohn's disease, COPD, and bipolar disorder, was admitted on 02/02/2024 due to an exacerbation of Crohn's disease. He presented with abdominal pain, diarrhea, and vomiting, which he reported as consistent with previous Crohn's flares. Elevated inflammatory markers, including ESR and CRP, supported the diagnosis of a Crohn's flare. Imaging was deferred due to recent CT scans showing consistent findings. The patient was started on methylprednisolone  20mg  Q8hrs to manage the Crohn's exacerbation.    During the hospitalization, secondary issues included hypoxemia, which was managed with supplemental oxygen, and hypomagnesemia, which was treated with IV magnesium . Hypernatremia was noted and resolved with maintenance IV fluids and increased oral intake. The patient also experienced normocytic anemia, likely related to his chronic condition, and continued his oral iron  supplementation. His COPD was managed with daily Breo-Ellipta. The patient has a history of multiple DVTs and was given enoxaparin  for prophylaxis. Bipolar disorder management included continuation of home medications Vraylar  and Seroquel . Throughout the admission, the patient maintained stable vital signs and respiratory function.

## 2024-02-04 NOTE — Unmapped (Signed)
 VENOUS ACCESS ULTRASOUND PROCEDURE NOTE    Indications:   Poor venous access.    The Venous Access Team has assessed this patient for the placement of a PIV. Ultrasound guidance was necessary to obtain access.     Procedure Details:  Identity of the patient was confirmed via name, medical record number and date of birth. The availability of the correct equipment was verified.    The vein was identified for ultrasound catheter insertion.  Field was prepared with necessary supplies and equipment.  Probe cover and sterile gel utilized.  Insertion site was prepped with chlorhexidine solution and allowed to dry.  The catheter extension was primed with normal saline. A(n) 20 gauge 2.25 catheter was placed in the L Forearm with 1attempt(s). See LDA for additional details.    Catheter aspirated, 4 mL blood return present. The catheter was then flushed with 10 mL of normal saline. Insertion site cleansed, and dressing applied per manufacturer guidelines. The catheter was inserted with difficulty due to poor vasculature by Legrand FORBES Devon, RN.    Primary RN was notified.     Thank you,     Legrand FORBES Devon, RN Venous Access Team   [phone]     Workup / Procedure Time:  30 minutes    See images below:

## 2024-02-04 NOTE — Unmapped (Signed)
 Pt seen per a Virtual Admissions request placed by the primary RN.  Pts admission was completed without family at the bedside.      An education assessment and initial education were completed.   Pt advised to use the call bell if they have questions, requests or need to get out of bed.  Falls education was added to their discharge AVS.        Pt denies having home medications at bedside.     A password was added to the chart and a HCDM and emergency contacts were confirmed during the admissions questionnaire.     Pt's expected discharge date is currently unknown.    The most recent VS recorded for the Pt are noted below:  BP 100/61  - Pulse 83  - Temp 36.4 ??C (97.5 ??F) (Oral)  - Resp 14  - Ht 172.7 cm (5' 7.99)  - Wt 68.9 kg (152 lb)  - SpO2 94%  - BMI 23.12 kg/m??     The Pt did not complain of pain and denies any needs at this time.

## 2024-02-04 NOTE — Unmapped (Signed)
 [facility] Consult [facility]   Initial Consultation         Assessment and Recommendations:   Jerome Alcala. is a 48 y.o. male with a PMHx of ileocoloninc stricturing non-penetrating Crohn's disease who presented to [facility] with abdominal pain, nausea, and vomiting. The patient is seen in consultation at the request of Jessica S Fuller, MD ([facility] for concern for Crohn's flare.    Jerome Kennedy may be suffering from a Crohn's flare versus associated infection such as abscess.  Recent imaging has not revealed any abscess and he denies any perianal symptoms.  Given CRP is elevated but not remarkably elevated to 100 and more, this may represent a flare versus infection but he will require ongoing monitoring to ensure this.  Given recent endoscopic workup and elevated inflammatory markers, will recommend starting IV steroids empirically and will reassess for symptom improvement.  If no symptom improvement, this would raise higher concern for an infectious process versus an Crohn's flare.  If he responds well to these, this would suggest Crohn's flare.    Recommendations:  1) Start IV methylpred 60 mg daily (or 20 mg Q8H)  2) trend daily CBC and CRP  3) Continue Lovenox  for DVT prophylaxis  4) Follow up C. Diff and GIPP (low concern for enteric infection given absence of diarrhea)  5) Follow up fecal calprotectin      Issues Impacting Complexity of Management:  -patient requires ongoing monitoring due to immunosuppression in the setting of active disease    Recommendations discussed with the patient's primary team. We will continue to follow along with you.    Subjective:     Jerome Kennedy reports onset of symptoms yesterday.  Symptoms include abdominal pain, nausea, vomiting.  There are no environmental factors associated with onset.  Emesis does not appear bloody.  He is having 1-2 bowel movements a day without any blood.  He has no anal pain, discharge or masses that he is noticed near his anus. He denies fevers.    Chart review reveals that he follows with Dr. Jama in IBD clinic as last seen on 01/14/2024.  He has a history of noncompliance and leaving the hospital AMA.  He has previously been on Humira , Rinvoq  (stopped because he could see tablets in his stools), attempted Skyrizi (could not get IV access), and was ultimately started on guselkumab  (Tremfya ) due to simplicity of dosing with subcu dosage form.  He reports that he is taken 1 dose of this approximately 2 weeks ago and injected the contents of 2 pens which should correspond to 400 mg.    His CRP has been steadily increasing over the month of June.  On chart review on arrival here, it is approximately 50.  He also has labs indicating iron  deficiency anemia.  White count is normal.  His most recent colonoscopy was done back in April which showed active disease (See below).    Historical information copied from Dr. 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 and high ostomy output s/p colostomy takedown and reanastomosis (10/2019), ventral abdominal hernia and incisional hernia s/p repair (2004) and recurrent CDI (s/p PO vancomycin  04/2022)     - 06/10/22 initial visit, continue humira , check fCal   - 07/2022: inpatient admission for outpatient labs showing hypocalcemia, CT with long segment circumferential bowel  wall thickening and hyperenhancement of the neoterminal ileum extending through the anastomosis of the proximal colon which was increased from prior CT scan along with luminal narrowing of this long segment of terminal ileum with focal proximal dilation concerning for stricture. Continue weekly humira  (had been increased 1 week prior to hospital admission).   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 Advised no steroids and continue Humira  on discharge  - 11/2022: multiple hospitalizations for abd pain, left AMA on 4/14 and again left AMA on 4/20. Re-admitted 4/23 abd underwent colonoscopy 11/30/23 which showed ongoing ileal disease in the 10 cm of neo-TI examined, Rutgeerts i3, colon otherwise normal. Advised to switch to Rinvoq  however he did not start therapy until over a month later despite insurance approval (non-compliance). Saw the Rinvoq  tablets in his stool so plan was to switch to Skyrizi however a peripheral IV was unable to be placed during his first infusion despite 5 staff members attempting IV placement so aborted. Switch to Tremfya .     -I have reviewed the patient's prior records from [facility] IBD clinic as summarized in the HPI    Objective:   Temp:  [36.4 ??C (97.5 ??F)-36.8 ??C (98.2 ??F)] 36.8 ??C (98.2 ??F)  Pulse:  [69-83] 77  SpO2 Pulse:  [69-83] 72  Resp:  [8-21] 18  BP: (86-106)/(58-71) 102/70  SpO2:  [90 %-100 %] 100 %    Physical Exam:  Gen: WDWN male in NAD, answers questions appropriately  Abdomen: Soft, mildly tender to palpation, no rebound/guarding  Extremities: No edema in the BLEs    Pertinent Labs & Studies:  -I have visualized the patient's labs dated 02/04/2024 which shows WBC=5.8, Hg=12.2, plt=183, BUN=9, Cr=0.99, iron  saturation=13%, CRP=50.4 (up from 23.3 on 06/25).    Colonoscopy 11/30/2023:  - Preparation of the colon was inadequate.  - Stool in the entire examined colon.  - Patent end-to-side ileo-colonic anastomosis, characterized by only very mild narrowing.  - Simple Endoscopic Score for Crohn's Disease: 7, mucosal inflammatory changes secondary to Crohn's disease with ileitis. Rutgeerts i3. No colonic involvement on today's exam.  - No specimens collected.    EGD 10/02/2023:  - Small hiatal hernia.  - Normal stomach.  - Normal examined duodenum.  - No specimens collected.

## 2024-02-04 NOTE — Unmapped (Signed)
 ED Progress Note    ED Course as of 02/04/24 1431   Wed Feb 03, 2024   1533 48yoM with Crohn's disease here with diarrhea, abdominal discomfort, and decreased energy signed out to me pending admission. Labs with elevated ESR and CRP. Patient declined CT. Vitals stable. Signed out to me pending admission for GI consultation and symptom control.    1830 On my repeat assessment, patient is sleeping comfortably in bed with stable vital signs and no evidence of acute distress.     Donnice Skains, MD  February 04, 2024 2:31 PM

## 2024-02-04 NOTE — Unmapped (Signed)
 [name]   Care Management     Patient is a 47 y.o. admitted on 02/02/2024 for Crohn's disease of colon without complication   [K50.10]. Per review of the medical record and discussion with the treating team, the patient does not meet indicators for a full assessment at this time. CM will continue to assess for discharge needs and follow up, as indicated.    Jerome Kennedy February 04, 2024 5:43 PM      Food Insecurity: No Food Insecurity (12/29/2023)    Hunger Vital Sign     Worried About Running Out of Food in the Last Year: Never true     Ran Out of Food in the Last Year: Never true      Financial Resource Strain: Low Risk  (12/29/2023)    Overall Financial Resource Strain (CARDIA)     Difficulty of Paying Living Expenses: Not hard at all     Housing: Low Risk  (12/29/2023)     Transportation Needs: No Transportation Needs (12/29/2023)    PRAPARE - Therapist, art (Medical): No     Lack of Transportation (Non-Medical): No

## 2024-02-05 LAB — BASIC METABOLIC PANEL
ANION GAP: 9 mmol/L (ref 5–14)
BLOOD UREA NITROGEN: 8 mg/dL — ABNORMAL LOW (ref 9–23)
BUN / CREAT RATIO: 8
CALCIUM: 8 mg/dL — ABNORMAL LOW (ref 8.7–10.4)
CHLORIDE: 110 mmol/L — ABNORMAL HIGH (ref 98–107)
CO2: 23 mmol/L (ref 20.0–31.0)
CREATININE: 0.96 mg/dL (ref 0.73–1.18)
EGFR CKD-EPI (2021) MALE: >90 mL/min/1.73m2 (ref >=60)
GLUCOSE RANDOM: 105 mg/dL — ABNORMAL HIGH (ref 70–99)
POTASSIUM: 4.2 mmol/L (ref 3.4–4.8)
SODIUM: 142 mmol/L (ref 135–145)

## 2024-02-05 LAB — CBC
HEMATOCRIT: 36.6 % — ABNORMAL LOW (ref 39.0–48.0)
HEMOGLOBIN: 12.1 g/dL — ABNORMAL LOW (ref 12.9–16.5)
MEAN CORPUSCULAR HEMOGLOBIN CONC: 33.1 g/dL (ref 32.0–36.0)
MEAN CORPUSCULAR HEMOGLOBIN: 28.3 pg (ref 25.9–32.4)
MEAN CORPUSCULAR VOLUME: 85.6 fL (ref 77.6–95.7)
MEAN PLATELET VOLUME: 7.5 fL (ref 6.8–10.7)
PLATELET COUNT: 209 10*9/L (ref 150–450)
RED BLOOD CELL COUNT: 4.28 10*12/L (ref 4.26–5.60)
RED CELL DISTRIBUTION WIDTH: 18.5 % — ABNORMAL HIGH (ref 12.2–15.2)
WBC ADJUSTED: 6.2 10*9/L (ref 3.6–11.2)

## 2024-02-05 LAB — C-REACTIVE PROTEIN: C-REACTIVE PROTEIN: 82.3 mg/L — ABNORMAL HIGH (ref <=10.0)

## 2024-02-05 LAB — CK: CREATINE KINASE TOTAL: 71 U/L (ref 46.0–171.0)

## 2024-02-05 MED ADMIN — prochlorperazine (COMPAZINE) injection 5 mg: 5 mg | INTRAVENOUS | @ 21:00:00

## 2024-02-05 MED ADMIN — prochlorperazine (COMPAZINE) injection 2.5 mg: 2.5 mg | INTRAVENOUS | @ 17:00:00

## 2024-02-05 MED ADMIN — oxyCODONE (ROXICODONE) immediate release tablet 5 mg: 5 mg | ORAL | @ 12:00:00 | Stop: 2024-02-17

## 2024-02-05 MED ADMIN — potassium chloride 10 mEq in 100 mL IVPB: 10 meq | INTRAVENOUS | @ 01:00:00 | Stop: 2024-02-04

## 2024-02-05 MED ADMIN — acetaminophen (TYLENOL) tablet 1,000 mg: 1000 mg | ORAL | @ 12:00:00

## 2024-02-05 MED ADMIN — cariprazine (VRAYLAR) capsule 1.5 mg: 1.5 mg | ORAL | @ 12:00:00

## 2024-02-05 MED ADMIN — cholecalciferol (vitamin D3 25 mcg (1,000 units)) tablet 25 mcg: 25 ug | ORAL | @ 12:00:00

## 2024-02-05 MED ADMIN — methylPREDNISolone sodium succinate (SOLU-Medrol) injection 20 mg: 20 mg | INTRAVENOUS | @ 21:00:00

## 2024-02-05 MED ADMIN — acetaminophen (TYLENOL) tablet 1,000 mg: 1000 mg | ORAL | @ 21:00:00

## 2024-02-05 MED ADMIN — nicotine (NICODERM CQ) 21 mg/24 hr patch 1 patch: 1 | TRANSDERMAL | @ 12:00:00

## 2024-02-05 MED ADMIN — potassium chloride 10 mEq in 100 mL IVPB: 10 meq | INTRAVENOUS | @ 03:00:00 | Stop: 2024-02-04

## 2024-02-05 MED ADMIN — potassium chloride 10 mEq in 100 mL IVPB: 10 meq | INTRAVENOUS | @ 04:00:00

## 2024-02-05 MED ADMIN — QUEtiapine (SEROQUEL) tablet 100 mg: 100 mg | ORAL | @ 02:00:00

## 2024-02-05 MED ADMIN — methylPREDNISolone sodium succinate (SOLU-Medrol) injection 20 mg: 20 mg | INTRAVENOUS | @ 09:00:00

## 2024-02-05 MED ADMIN — oxyCODONE (ROXICODONE) immediate release tablet 5 mg: 5 mg | ORAL | @ 03:00:00 | Stop: 2024-02-17

## 2024-02-05 MED ADMIN — fluticasone furoate-vilanterol (BREO ELLIPTA) 100-25 mcg/dose inhaler 1 puff: 1 | RESPIRATORY_TRACT | @ 12:00:00

## 2024-02-05 MED ADMIN — oxyCODONE (ROXICODONE) immediate release tablet 5 mg: 5 mg | ORAL | @ 21:00:00 | Stop: 2024-02-17

## 2024-02-05 MED ADMIN — diphenhydrAMINE (BENADRYL) capsule/tablet 25 mg: 25 mg | ORAL | @ 21:00:00

## 2024-02-05 MED ADMIN — iohexol (OMNIPAQUE) 240 mg iodine/mL oral solution 50 mL: 50 mL | ORAL | Stop: 2024-02-05

## 2024-02-05 MED ADMIN — ferrous sulfate tablet 325 mg: 325 mg | ORAL | @ 12:00:00

## 2024-02-05 MED ADMIN — prochlorperazine (COMPAZINE) injection 5 mg: 5 mg | INTRAVENOUS | @ 01:00:00

## 2024-02-05 MED ADMIN — acetaminophen (TYLENOL) tablet 1,000 mg: 1000 mg | ORAL | @ 02:00:00

## 2024-02-05 MED ADMIN — prochlorperazine (COMPAZINE) injection 2.5 mg: 2.5 mg | INTRAVENOUS | @ 12:00:00

## 2024-02-05 MED ADMIN — enoxaparin (LOVENOX) syringe 40 mg: 40 mg | SUBCUTANEOUS | @ 03:00:00

## 2024-02-05 MED ADMIN — methylPREDNISolone sodium succinate (SOLU-Medrol) injection 20 mg: 20 mg | INTRAVENOUS | @ 02:00:00

## 2024-02-05 MED ADMIN — potassium chloride 10 mEq in 100 mL IVPB: 10 meq | INTRAVENOUS | @ 05:00:00 | Stop: 2024-02-05

## 2024-02-05 MED ADMIN — pantoprazole (Protonix) EC tablet 40 mg: 40 mg | ORAL | @ 12:00:00

## 2024-02-05 NOTE — Unmapped (Signed)
 Shift Summary  New PIV inserted by VAT team.    Potassium chloride  in water  was administered after PIV re-insertion.    Prochlorperazine  was administered PRN.    OxyCODONE  was administered PRN for pain management.    Educated pt about collecting stool sample, pt acknowledged  Slept and stayed in bed most of the time  Overall, the patient maintained stable skin integrity and infection prevention measures were upheld.     Absence of Hospital-Acquired Illness or Injury: Skin integrity remained intact, and skin protection measures such as limited adhesive use were consistently applied throughout the shift.     Absence of Infection Signs and Symptoms: Aseptic technique and hand hygiene were maintained, and enteric precautions were followed.     Optimal Pain Control and Function: Pain levels increased slightly from 5 to 6, and oxyCODONE  was administered PRN to address acute abdominal pain.

## 2024-02-05 NOTE — Unmapped (Signed)
 Shift Summary  Pain management interventions were administered, leading to reported improvement.    Prochlorperazine  was administered PRN for nausea control.    Oxycodone  was administered PRN for pain control.    Diphenhydramine  was administered PRN for itching.    Overall, the patient remained stable with consistent infection prevention and fall risk interventions.     Absence of Infection Signs and Symptoms: Aseptic technique was consistently maintained throughout the shift, and infection prevention measures were regularly performed. Enteric precautions were also maintained consistently.     Absence of Fall and Fall-Related Injury: Toileting every two hours was consistently performed, and hourly visual checks showed the patient was mostly in bed, either awake or with eyes closed.     Maintenance of COPD Symptom Control: Respiratory assessments indicated exceptions to WDL, with clear but diminished bilateral breath sounds, and regular, unlabored respiratory patterns.     Optimal Pain Control and Function: Pain in the right lower abdomen was sharp and constant, with interventions leading to improvement, though pain levels fluctuated. Oxycodone  was administered PRN.

## 2024-02-05 NOTE — Unmapped (Deleted)
 [facility]   Progress Note         Assessment and Recommendations:   Jerome Zuercher. is a 48 y.o. male with a PMHx of ileocoloninc stricturing non-penetrating Crohn's disease who presented to [facility] with abdominal pain, nausea, and vomiting. The patient is seen in consultation at the request of [name], MD ([facility])) for concern for Crohn's flare.    Jerome Kennedy may be suffering from a Crohn's flare versus associated infection such as abscess.  Recent imaging has not revealed any abscess and he denies any perianal symptoms.  Given CRP is elevated but not remarkably elevated to 100 and more, this may represent a flare versus infection but he will require ongoing monitoring to ensure this.  Given recent endoscopic workup and elevated inflammatory markers, will recommend continuing IV steroids empirically and will reassess improvement in symptoms and serum CRP.  If no symptom improvement, this would raise higher concern for an infectious process versus an Crohn's flare.  If he responds well to these, this would suggest Crohn's flare. As of 07/04, he had mild improvement in pain (but also in the setting of narcotics) but had been on IV steroids less than 24 hours.    Recommendations:  1) Continue IV methylpred 60 mg daily (or 20 mg Q8H)  2) Trend daily CBC and CRP  3) Continue Lovenox  for DVT prophylaxis  4) Follow up C. Diff and GIPP (low concern for enteric infection given absence of diarrhea)  5) Follow up fecal calprotectin  6) Advance diet as tolerated  7) Decrease PRN narcotics as able    Issues Impacting Complexity of Management:  -patient requires ongoing monitoring due to immunosuppression in the setting of active disease    Recommendations discussed with the patient's primary team. We will continue to follow along with you.    Subjective:     He reports mild improvement in abdominal pain. He thinks this is due to both pain medications and steroids. He reports ongoing nausea (receiving PRN and ondansetron ) but no vomiting since last assessment. He continues to denny diarrhea and blood in BMs.    Historical information copied from Dr. 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 and high ostomy output s/p colostomy takedown and reanastomosis (10/2019), ventral abdominal hernia and incisional hernia s/p repair (2004) and recurrent CDI (s/p PO vancomycin  04/2022)     - 06/10/22 initial visit, continue humira , check fCal   - 07/2022: inpatient admission for outpatient labs showing hypocalcemia, CT with long segment circumferential bowel wall thickening and hyperenhancement of the neoterminal ileum extending through the anastomosis of the proximal colon which was increased from prior CT scan along with luminal narrowing of this long segment of terminal ileum with focal proximal dilation concerning for stricture. Continue weekly humira  (had been increased 1 week prior to hospital admission).   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 Advised no steroids and continue Humira  on discharge  - 11/2022: multiple hospitalizations for abd pain, left AMA on 4/14 and again left AMA on 4/20. Re-admitted 4/23 abd underwent colonoscopy 11/30/23 which showed  ongoing ileal disease in the 10 cm of neo-TI examined, Rutgeerts i3, colon otherwise normal. Advised to switch to Rinvoq  however he did not start therapy until over a month later despite insurance approval (non-compliance). Saw the Rinvoq  tablets in his stool so plan was to switch to Skyrizi however a peripheral IV was unable to be placed during his first infusion despite 5 staff members attempting IV placement so aborted. Switch to Tremfya .     -I have reviewed the patient's prior records from [facility] IBD clinic as summarized in the HPI    Objective:   Temp:  [36.3 ??C (97.3 ??F)-37.1 ??C (98.8 ??F)] 36.3 ??C (97.3 ??F)  Pulse:  [76-81] 78  Resp:  [18-22] 22  BP: (96-122)/(67-78) 110/78  SpO2:  [95 %-100 %] 99 %    Physical Exam:  Gen: WDWN male in NAD, answers questions appropriately  Abdomen: Soft, mildly tender to palpation, no rebound/guarding,  Extremities: No edema in the BLEs    Pertinent Labs & Studies:  -I have visualized the patient's labs dated 02/05/2024 which shows WBC=6.2, Hg=12.1, plt=209, BUN=8, Cr=0.96, C diff negative    Colonoscopy 11/30/2023:  - Preparation of the colon was inadequate.  - Stool in the entire examined colon.  - Patent end-to-side ileo-colonic anastomosis, characterized by only very mild narrowing.  - Simple Endoscopic Score for Crohn's Disease: 7, mucosal inflammatory changes secondary to Crohn's disease with ileitis. Rutgeerts i3. No colonic involvement on today's exam.  - No specimens collected.    EGD 10/02/2023:  - Small hiatal hernia.  - Normal stomach.  - Normal examined duodenum.  - No specimens collected.

## 2024-02-05 NOTE — Unmapped (Addendum)
 Hospital Medicine Daily Progress Note    Assessment/Plan:    Principal Problem:    Exacerbation of Crohn's disease     Active Problems:    Anemia    Hypomagnesemia    Bipolar 1 disorder       COPD (chronic obstructive pulmonary disease)       Hx of deep venous thrombosis    Tobacco use disorder    Hypernatremia                 Jerome Kennedy. is a 48 y.o. male that presented to [facility] with Exacerbation of Crohn's disease   .    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 Presented with 1 day of increased  abdominal pain, vomiting, and diarrhea along with elevated ESR/CRP. Etiology concerning for possible Crohn's flare vs chronically poorly controlled symptoms due to difficulty with medication compliance (uncertain if he completed recent prednisone  taper appropriately). First dose of Tremfya  completed 6/28. C. Diff negative. KUB without evidence of obstruction.   - Appreciate GI consult and recommendations. Discussed with fellow, and recommended to advance diet. GI team also recommends to obtain CT abdomen/pelvis with IV and PO contrast to assess for abscess.   - Continue IV methylprednisolone  20 mg q 8hrs   - Follow up fecal calprotectin and GIPP  - Continue pain control with scheduled acetaminophen , PRN oxycodone , and PRN IV Dilaudid   - Will monitor daily CBC, BMP, and CRP     Possible hypoxia: Hypoxia was not documented but placed on O2 2L in ED. However, patient weaned to RA without any issue.  - Continue to monitor      Hypokalemia I Hypomagnesemia: Resolved s/p repletion. Likely secondary to poor PO intake  - Continue to monitor     Hypernatremia: Resolved. Suspect due to poor p.o. intake and vomiting  - Continue to monitor Normocytic anemia: H/H stable at this time.   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 He is not on lifelong anti-coagulation on advice of Heme/Onc due to bleeding risk with his IBD (see note from 2022 [facility] hospitalization). He is at elevated risk for clot in the setting of Crohn's flare.   - Continue DVT ppx     Bipolar disorder   - Continue home Vraylar  1.5 mg daily and Seroquel  100 mg daily       DVT Prophylaxis: Enoxaparin      Diet: Regular/House    Code Status: Full Code  ___________________________________________________________________    Subjective:  Patient seen and examined at bedside in the AM. No acute events overnight. Patient reports he is feeling a little better, however reports he still has some pain in lower quadrants in his abdomen. Reports the pain is sharp in nature. Reports he continues to have nausea, and reports he vomited after eating chicken broth this AM. Reports he feels anti-emetic helped some with nausea. Reports last BM was yesterday, and states it was mixed with loose stool and some formed stool. Denies any blood. No BMs today but reports he is passing flatus. Denies fever,  chills, SOB, cough, CP.     Labs/Studies:  Labs and Studies from the last 24hrs per EMR and Reviewed  Labs significant for: WBC 6.2, H/H 12.1/36.6, platelets 209, Na 142, K 4.2, BUN/Cr 8/0.96, Ca 8, CK 71, C diff negative     Objective:  Temp:  [36.3 ??C (97.3 ??F)-37.1 ??C (98.8 ??F)] 36.3 ??C (97.3 ??F)  Pulse:  [76-81] 78  Resp:  [18-22] 22  BP: (96-122)/(67-78) 110/78  SpO2:  [95 %-100 %] 99 %    GEN: NAD, lying in bed  CV: RRR  PULM: CTA B  ABD: soft, +TTP diffusely, ND, +BS  EXT: No edema

## 2024-02-05 NOTE — Unmapped (Signed)
 [facility]   Progress Note         Assessment and Recommendations:   Jerome Kennedy. is a 48 y.o. male with a PMHx of ileocoloninc stricturing non-penetrating Crohn's disease who presented to [facility] with abdominal pain, nausea, and vomiting. The patient is seen in consultation at the request of Jerome Alm Sprung, MD ([facility])) for concern for Crohn's flare.    Mr. Jerome Kennedy may be suffering from a Crohn's flare versus associated infection such as abscess.  Recent imaging has not revealed any abscess and he denies any perianal symptoms.  Given CRP is elevated but not remarkably elevated to 100 and more, this may represent a flare versus infection but he will require ongoing monitoring to ensure this.  Given recent endoscopic workup and elevated inflammatory markers, will recommend continuing IV steroids empirically and will reassess improvement in symptoms and serum CRP.  If no symptom improvement, this would raise higher concern for an infectious process versus an Crohn's flare.  If he responds well to these, this would suggest Crohn's flare. As of 07/04, he had mild improvement in pain (but also in the setting of narcotics) but had been on IV steroids less than 24 hours.    Recommendations:  1) Continue IV methylpred 60 mg daily (or 20 mg Q8H)  2) Trend daily CBC and CRP  3) Continue Lovenox  for DVT prophylaxis  4) Follow up C. Diff and GIPP (low concern for enteric infection given absence of diarrhea)  5) Follow up fecal calprotectin  6) Advance diet as tolerated  7) Decrease PRN narcotics as able    Issues Impacting Complexity of Management:  -patient requires ongoing monitoring due to immunosuppression in the setting of active disease    Recommendations discussed with the patient's primary team. We will continue to follow along with you.    Subjective:     He reports mild improvement in abdominal pain. He thinks this is due to both pain medications and steroids. He reports ongoing nausea (receiving PRN and ondansetron ) but no vomiting since last assessment. He continues to denny diarrhea and blood in BMs.    Historical information copied from Dr. 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 and high ostomy output s/p colostomy takedown and reanastomosis (10/2019), ventral abdominal hernia and incisional hernia s/p repair (2004) and recurrent CDI (s/p PO vancomycin  04/2022)     - 06/10/22 initial visit, continue humira , check fCal   - 07/2022: inpatient admission for outpatient labs showing hypocalcemia, CT with long segment circumferential bowel wall thickening and hyperenhancement of the neoterminal ileum extending through the anastomosis of the proximal colon which was increased from prior CT scan along with luminal narrowing of this long segment of terminal ileum with focal proximal dilation concerning for stricture. Continue weekly humira  (had been increased 1 week prior to hospital admission).   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 Advised no steroids and continue Humira  on discharge  - 11/2022: multiple hospitalizations for abd pain, left AMA on 4/14 and again left AMA on 4/20. Re-admitted 4/23 abd underwent colonoscopy 11/30/23 which showed  ongoing ileal disease in the 10 cm of neo-TI examined, Rutgeerts i3, colon otherwise normal. Advised to switch to Rinvoq  however he did not start therapy until over a month later despite insurance approval (non-compliance). Saw the Rinvoq  tablets in his stool so plan was to switch to Skyrizi however a peripheral IV was unable to be placed during his first infusion despite 5 staff members attempting IV placement so aborted. Switch to Tremfya .     -I have reviewed the patient's prior records from [facility] IBD clinic as summarized in the HPI    Objective:   Temp:  [36.3 ??C (97.3 ??F)-37.1 ??C (98.8 ??F)] 36.3 ??C (97.3 ??F)  Pulse:  [76-81] 78  Resp:  [18-22] 22  BP: (96-122)/(67-78) 110/78  SpO2:  [95 %-100 %] 99 %    Physical Exam:  Gen: WDWN male in NAD, answers questions appropriately  Abdomen: Soft, mildly tender to palpation, no rebound/guarding,  Extremities: No edema in the BLEs    Pertinent Labs & Studies:  -I have visualized the patient's labs dated 02/05/2024 which shows WBC=6.2, Hg=12.1, plt=209, BUN=8, Cr=0.96, C diff negative    Colonoscopy 11/30/2023:  - Preparation of the colon was inadequate.  - Stool in the entire examined colon.  - Patent end-to-side ileo-colonic anastomosis, characterized by only very mild narrowing.  - Simple Endoscopic Score for Crohn's Disease: 7, mucosal inflammatory changes secondary to Crohn's disease with ileitis. Rutgeerts i3. No colonic involvement on today's exam.  - No specimens collected.    EGD 10/02/2023:  - Small hiatal hernia.  - Normal stomach.  - Normal examined duodenum.  - No specimens collected.

## 2024-02-06 LAB — BASIC METABOLIC PANEL
ANION GAP: 10 mmol/L (ref 5–14)
BLOOD UREA NITROGEN: 9 mg/dL (ref 9–23)
BUN / CREAT RATIO: 10
CALCIUM: 8.1 mg/dL — ABNORMAL LOW (ref 8.7–10.4)
CHLORIDE: 109 mmol/L — ABNORMAL HIGH (ref 98–107)
CO2: 24 mmol/L (ref 20.0–31.0)
CREATININE: 0.89 mg/dL (ref 0.73–1.18)
EGFR CKD-EPI (2021) MALE: >90 mL/min/1.73m2 (ref >=60)
GLUCOSE RANDOM: 114 mg/dL (ref 70–179)
POTASSIUM: 3.8 mmol/L (ref 3.4–4.8)
SODIUM: 143 mmol/L (ref 135–145)

## 2024-02-06 LAB — CBC
HEMATOCRIT: 33.3 % — ABNORMAL LOW (ref 39.0–48.0)
HEMOGLOBIN: 11.4 g/dL — ABNORMAL LOW (ref 12.9–16.5)
MEAN CORPUSCULAR HEMOGLOBIN CONC: 34.2 g/dL (ref 32.0–36.0)
MEAN CORPUSCULAR HEMOGLOBIN: 28.5 pg (ref 25.9–32.4)
MEAN CORPUSCULAR VOLUME: 83.3 fL (ref 77.6–95.7)
MEAN PLATELET VOLUME: 7.9 fL (ref 6.8–10.7)
PLATELET COUNT: 195 10*9/L (ref 150–450)
RED BLOOD CELL COUNT: 3.99 10*12/L — ABNORMAL LOW (ref 4.26–5.60)
RED CELL DISTRIBUTION WIDTH: 18.2 % — ABNORMAL HIGH (ref 12.2–15.2)
WBC ADJUSTED: 5.2 10*9/L (ref 3.6–11.2)

## 2024-02-06 LAB — C-REACTIVE PROTEIN: C-REACTIVE PROTEIN: 44.7 mg/L — ABNORMAL HIGH (ref <=10.0)

## 2024-02-06 LAB — MAGNESIUM: MAGNESIUM: 1.6 mg/dL (ref 1.6–2.6)

## 2024-02-06 MED ADMIN — prochlorperazine (COMPAZINE) injection 5 mg: 5 mg | INTRAVENOUS | @ 12:00:00

## 2024-02-06 MED ADMIN — enoxaparin (LOVENOX) syringe 40 mg: 40 mg | SUBCUTANEOUS | @ 02:00:00

## 2024-02-06 MED ADMIN — iohexol (OMNIPAQUE) 350 mg iodine/mL solution 100 mL: 100 mL | INTRAVENOUS | @ 03:00:00 | Stop: 2024-02-05

## 2024-02-06 MED ADMIN — diphenhydrAMINE (BENADRYL) capsule/tablet 25 mg: 25 mg | ORAL | @ 12:00:00

## 2024-02-06 MED ADMIN — QUEtiapine (SEROQUEL) tablet 100 mg: 100 mg | ORAL | @ 02:00:00

## 2024-02-06 MED ADMIN — oxyCODONE (ROXICODONE) immediate release tablet 5 mg: 5 mg | ORAL | @ 12:00:00 | Stop: 2024-02-17

## 2024-02-06 MED ADMIN — nicotine (NICODERM CQ) 21 mg/24 hr patch 1 patch: 1 | TRANSDERMAL | @ 12:00:00

## 2024-02-06 MED ADMIN — diphenhydrAMINE (BENADRYL) capsule/tablet 25 mg: 25 mg | ORAL | @ 03:00:00

## 2024-02-06 MED ADMIN — oxyCODONE (ROXICODONE) immediate release tablet 5 mg: 5 mg | ORAL | @ 02:00:00 | Stop: 2024-02-17

## 2024-02-06 MED ADMIN — oxyCODONE (ROXICODONE) immediate release tablet 5 mg: 5 mg | ORAL | @ 16:00:00 | Stop: 2024-02-17

## 2024-02-06 MED ADMIN — acetaminophen (TYLENOL) tablet 1,000 mg: 1000 mg | ORAL | @ 12:00:00

## 2024-02-06 MED ADMIN — cariprazine (VRAYLAR) capsule 1.5 mg: 1.5 mg | ORAL | @ 12:00:00

## 2024-02-06 MED ADMIN — ferrous sulfate tablet 325 mg: 325 mg | ORAL | @ 12:00:00

## 2024-02-06 MED ADMIN — methylPREDNISolone sodium succinate (SOLU-Medrol) injection 20 mg: 20 mg | INTRAVENOUS | @ 02:00:00

## 2024-02-06 MED ADMIN — fluticasone furoate-vilanterol (BREO ELLIPTA) 100-25 mcg/dose inhaler 1 puff: 1 | RESPIRATORY_TRACT | @ 12:00:00

## 2024-02-06 MED ADMIN — acetaminophen (TYLENOL) tablet 1,000 mg: 1000 mg | ORAL | @ 02:00:00

## 2024-02-06 MED ADMIN — methylPREDNISolone sodium succinate (SOLU-Medrol) injection 20 mg: 20 mg | INTRAVENOUS | @ 10:00:00 | Stop: 2024-02-06

## 2024-02-06 MED ADMIN — pantoprazole (Protonix) EC tablet 40 mg: 40 mg | ORAL | @ 12:00:00

## 2024-02-06 MED ADMIN — predniSONE (DELTASONE) tablet 40 mg: 40 mg | ORAL | @ 20:00:00

## 2024-02-06 MED ADMIN — cholecalciferol (vitamin D3 25 mcg (1,000 units)) tablet 25 mcg: 25 ug | ORAL | @ 12:00:00

## 2024-02-06 MED ADMIN — acetaminophen (TYLENOL) tablet 1,000 mg: 1000 mg | ORAL | @ 20:00:00

## 2024-02-06 NOTE — Unmapped (Signed)
 Hospital Medicine Daily Progress Note    Assessment/Plan:    Principal Problem:    Exacerbation of Crohn's disease     Active Problems:    Anemia    Hypomagnesemia    Bipolar 1 disorder       COPD (chronic obstructive pulmonary disease)       Hx of deep venous thrombosis    Tobacco use disorder    Hypernatremia                 Jerome Kennedy. is a 48 y.o. male that presented to [facility] with Exacerbation of Crohn's disease   .    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 Presented with 1 day of increased  abdominal pain, vomiting, and diarrhea along with elevated ESR/CRP. Etiology concerning for possible Crohn's flare vs chronically poorly controlled symptoms due to difficulty with medication compliance (uncertain if he completed recent prednisone  taper appropriately). First dose of Tremfya  completed 6/28. C. Diff negative. KUB without evidence of obstruction. CT abdomen/pelvis revealed no evidence of penetrating disease / abscess but possible transition point and question of swirling vessel in internal hernia but no findings of obstruction. Clinically the patient does not have obstruction.   - Appreciate GI consult and recommendations. Discussed with attending, and recommended to change steroids to PO prednisone  40 mg daily. Reassuring his CRP is decreasing and that the patient's symptoms are significantly improved. Suspects his severe abdominal pain and nausea were secondary to an unrecognized partial obstruction from volvulus in his hernia that has now opened up.    - Start PO prednisone  40 mg daily - and will discuss taper prior to discharge in continues to improve   - Follow up fecal calprotectin and GIPP  - Continue pain control with scheduled acetaminophen , PRN oxycodone   - Will monitor daily CBC, BMP, and CRP  - Discussed with RN to document strict Is/Os especially focusing on BMs     Possible hypoxia: Hypoxia was not documented but placed on O2 2L in ED. However, patient weaned to RA without any issue.  - Continue to monitor      Hypokalemia I Hypomagnesemia: Resolved s/p repletion. Likely secondary to poor PO intake  - Continue to monitor     Hypernatremia: Resolved. Suspect due to poor p.o. intake and vomiting  - Continue to monitor     Normocytic anemia: H/H stable at this time.   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 He is not on lifelong anti-coagulation on advice of Heme/Onc due to bleeding risk with his IBD (see note from 2022 [facility] hospitalization). He is at elevated risk for clot in the setting of Crohn's flare.   - Continue DVT ppx     Bipolar disorder   - Continue home Vraylar  1.5 mg daily and Seroquel  100 mg daily       DVT Prophylaxis: Enoxaparin      Diet: Regular/House    Code Status: Full Code  ___________________________________________________________________    Subjective:  Patient seen and examined at bedside at midday. No acute events  overnight. Patient reports he is feeling much better today. Reports his abdominal pain is significantly improved. Reports he is tolerating regular diet without any nausea or vomiting. No BM overnight, but reports he is passing flatus. Patient denies fever, chills, CP, SOB, cough.      Labs/Studies:  Labs and Studies from the last 24hrs per EMR and Reviewed  Labs significant for: WBC 5.2, H/H 11.4/33.3, platelets 195, Na 143, K 3.8, BUN/Cr 9/0.89, Ca 8.1, CRP 44.7     Objective:  Temp:  [36.3 ??C (97.3 ??F)-36.8 ??C (98.2 ??F)] 36.3 ??C (97.3 ??F)  Pulse:  [70-80] 70  SpO2 Pulse:  [80] 80  Resp:  [19-20] 19  BP: (99-153)/(67-90) 109/75  SpO2:  [96 %-100 %] 96 %    GEN: NAD, lying in bed  CV: RRR, normal S1/S2, no murmur appreciated  PULM: CTA B anteriorly, no increased WOB  ABD: soft, NT/ND, + normoactive BS  EXT: No pitting edema

## 2024-02-06 NOTE — Unmapped (Signed)
 Shift Summary  Oxycodone  was administered twice during the shift, effectively reducing abdominal pain from #7 to #2.  Prochlorperazine  was administered PRN in the morning with effective result.   Vitals remained stable throughout the shift, with no significant changes noted.    Prednisone  was administered in the afternoon.    Overall, the patient experienced a reduction in pain and maintained stable vital signs.     Absence of Hospital-Acquired Illness or Injury: Aseptic technique was consistently maintained, and infection prevention measures were upheld throughout the shift.     Optimal Comfort and Wellbeing: Pain was managed with oxycodone , resulting in a decrease in pain levels from 6 to 2 on the pain scale by the end of the shift.     Absence of Infection Signs and Symptoms: Enteric precautions were maintained consistently throughout the shift.     Optimal Pain Control and Function: Pain in the abdomen was constant and acute, but interventions with oxycodone  reduced the pain level significantly.

## 2024-02-06 NOTE — Unmapped (Signed)
 [facility]   Progress Note         Assessment and Recommendations:   Jerome Kennedy. is a 48 y.o. male with a PMHx of stricturing ileocolonic Crohn's disease status post ileocecetomy (2003), right hemicolectomy and end-ileostomy (1/ 2021) status post takedown (10/2019)  who presented to [facility] with abdominal pain, nausea, vomiting. The patient is seen in consultation at the request of [name], MD ([facility])) for Crohn's disease.    Patient with history of stricturing ileocolonic Crohn's disease status post multiple resections with 11/2023 colonoscopy demonstrating inflammation of neo-terminal ileum (Rutgeert's i3) and ongoing symptoms of active flare, recently started on guselkumab  (completed 2 self injections per patient) who was admitted for worsening abdominal pain and nausea (but not diarrhea) after initial guselkumab  induction injections, found to have rising CRP since admission to peak CRP 82 yesterday. CT Abdomen/Pelvis with oral and IV contrast (02/05/2024) was ordered given rising CRP and concern for penetrating disease, and showed no evidence of penetrating disease / abscess but possible transition point and question of swirling vessel in internal hernia but no findings of obstruction. His CRP has improved today since initiation of IV steroids, now 44. Given improvement on imaging of his neo-terminal inflammation, I am suspicious that his severe abdominal pain and nausea were secondary to an unrecognized partial obstruction from volvulus in his hernia that has now opened up. Now, he has no obstruction on imaging, is clinical not obstructed, and CRP is down-trending, abdominal pain has fully resolved and he was able to eat breakfast and lunch without nausea or pain, so there is no indication for intervention at this time. Since he can tolerate oral, I recommend switching to 40mg  oral prednisone  and monitoring his stool output. If he continues to tolerate a diet and is having bowel movements, he can likely discharge in 24-48 hours with a steroid taper, presuming that active inflammation contributed to his partial obstruction episode.     Issues Impacting Complexity of Management:  -None    Recommendations discussed with the patient's primary team. We will continue to follow along with you.    Subjective:   Abdominal pain has fully resolved since yesterday. CRP downtrending from 83 to 44. At breakfast and lunch today    CT Abdomen/Pelvis with oral and IV contrast 02/05/2024: No evidence of abscess formation within the abdomen or pelvis as clinically questioned. Interval resolution of the inflammatory changes of the neoterminal ileum. Enteric contrast is seen to the level of the ileocolic anastomosis. Distended loops of colon is seen along the anterior abdomen to the level of the sigmoid:. There appears to be a transition point (2:84, 2:82). An internal hernia with evidence of swirling of vessels (2:57, 2:79 is queried. While this could reflect an internal hernia or possibly adhesive changes, there is no findings for obstruction at the time of this exam but may be the source related to this patient's persistent abdominal pain    Objective:   Temp:  [36.3 ??C (97.3 ??F)-36.8 ??C (98.2 ??F)] 36.3 ??C (97.3 ??F)  Pulse:  [58-80] 58  SpO2 Pulse:  [80] 80  Resp:  [19-20] 19  BP: (99-153)/(67-90) 112/75  SpO2:  [96 %-100 %] 98 %    Gen: WDWN male in NAD, answers questions appropriately  Abdomen: large ventral hernia, diffusely tympanic but soft, non-tender to deep palpation; (yesterday had exquisite pain in right lower quadrant/suprapubic area)  Extremities: No edema in the BLEs    Pertinent Labs &  Studies:  -I have reviewed the patient's labs from 02/06/24 which show CRP 44 from 83.

## 2024-02-06 NOTE — Unmapped (Signed)
 Shift Summary  Pain in the right lower abdomen was assessed at level 6, and oxyCODONE  was administered to manage it.    diphenhydrAMINE  was administered for itching.    iohexol  was administered in the IMG CT [facility] for contrast.    Temperature remained stable throughout the shift.    Overall, the patient maintained stable conditions with effective pain management and no falls.     Absence of Hospital-Acquired Illness or Injury: Temperature remained stable throughout the shift, with no significant fluctuations noted.     Optimal Comfort and Wellbeing: Pain in the right lower abdomen was reported at a level of 6, and oxyCODONE  was administered to address this.     Readiness for Transition of Care: The unplanned readmission score showed a slight decrease over the shift.     Absence of Fall and Fall-Related Injury: Safety measures, including side rails and hourly visual checks, were consistently maintained, and no falls occurred.     Optimal Pain Control and Function: Pain management was addressed with the administration of oxyCODONE , and the pain level was assessed at 6.     Problem: Adult Inpatient Plan of Care  Goal: Plan of Care Review  Outcome: Shift Focus  Goal: Patient-Specific Goal (Individualized)  Outcome: Shift Focus  Goal: Absence of Hospital-Acquired Illness or Injury  Outcome: Shift Focus  Goal: Optimal Comfort and Wellbeing  Outcome: Shift Focus  Goal: Readiness for Transition of Care  Outcome: Shift Focus

## 2024-02-07 LAB — BASIC METABOLIC PANEL
ANION GAP: 11 mmol/L (ref 5–14)
BLOOD UREA NITROGEN: 8 mg/dL — ABNORMAL LOW (ref 9–23)
BUN / CREAT RATIO: 9
CALCIUM: 7.9 mg/dL — ABNORMAL LOW (ref 8.7–10.4)
CHLORIDE: 106 mmol/L (ref 98–107)
CO2: 27 mmol/L (ref 20.0–31.0)
CREATININE: 0.91 mg/dL (ref 0.73–1.18)
EGFR CKD-EPI (2021) MALE: >90 mL/min/1.73m2 (ref >=60)
GLUCOSE RANDOM: 91 mg/dL (ref 70–179)
POTASSIUM: 3.1 mmol/L — ABNORMAL LOW (ref 3.4–4.8)
SODIUM: 144 mmol/L (ref 135–145)

## 2024-02-07 LAB — CBC
HEMATOCRIT: 34.7 % — ABNORMAL LOW (ref 39.0–48.0)
HEMOGLOBIN: 11.7 g/dL — ABNORMAL LOW (ref 12.9–16.5)
MEAN CORPUSCULAR HEMOGLOBIN CONC: 33.8 g/dL (ref 32.0–36.0)
MEAN CORPUSCULAR HEMOGLOBIN: 28.6 pg (ref 25.9–32.4)
MEAN CORPUSCULAR VOLUME: 84.8 fL (ref 77.6–95.7)
MEAN PLATELET VOLUME: 7.7 fL (ref 6.8–10.7)
PLATELET COUNT: 186 10*9/L (ref 150–450)
RED BLOOD CELL COUNT: 4.09 10*12/L — ABNORMAL LOW (ref 4.26–5.60)
RED CELL DISTRIBUTION WIDTH: 18.4 % — ABNORMAL HIGH (ref 12.2–15.2)
WBC ADJUSTED: 5.5 10*9/L (ref 3.6–11.2)

## 2024-02-07 LAB — MAGNESIUM: MAGNESIUM: 1.4 mg/dL — ABNORMAL LOW (ref 1.6–2.6)

## 2024-02-07 LAB — C-REACTIVE PROTEIN: C-REACTIVE PROTEIN: 24.6 mg/L — ABNORMAL HIGH (ref <=10.0)

## 2024-02-07 MED ORDER — PROCHLORPERAZINE MALEATE 5 MG TABLET
ORAL_TABLET | Freq: Four times a day (QID) | ORAL | 0 refills | 5.00000 days | Status: CP | PRN
Start: 2024-02-07 — End: 2024-02-12
  Filled 2024-02-07: qty 20, 5d supply, fill #0

## 2024-02-07 MED ORDER — PANTOPRAZOLE 40 MG TABLET,DELAYED RELEASE
ORAL_TABLET | Freq: Every day | ORAL | 0 refills | 90.00000 days | Status: CP
Start: 2024-02-07 — End: 2024-05-07
  Filled 2024-02-07: qty 90, 90d supply, fill #0

## 2024-02-07 MED ADMIN — QUEtiapine (SEROQUEL) tablet 100 mg: 100 mg | ORAL | @ 01:00:00

## 2024-02-07 MED ADMIN — pantoprazole (Protonix) EC tablet 40 mg: 40 mg | ORAL | @ 13:00:00

## 2024-02-07 MED ADMIN — cholecalciferol (vitamin D3 25 mcg (1,000 units)) tablet 25 mcg: 25 ug | ORAL | @ 13:00:00

## 2024-02-07 MED ADMIN — acetaminophen (TYLENOL) tablet 1,000 mg: 1000 mg | ORAL | @ 01:00:00

## 2024-02-07 MED ADMIN — nicotine (NICODERM CQ) 21 mg/24 hr patch 1 patch: 1 | TRANSDERMAL | @ 13:00:00

## 2024-02-07 MED ADMIN — magnesium sulfate 2gm/50mL IVPB: 2 g | INTRAVENOUS | @ 17:00:00 | Stop: 2024-02-07

## 2024-02-07 MED ADMIN — cariprazine (VRAYLAR) capsule 1.5 mg: 1.5 mg | ORAL | @ 13:00:00

## 2024-02-07 MED ADMIN — diphenhydrAMINE (BENADRYL) capsule/tablet 25 mg: 25 mg | ORAL | @ 01:00:00

## 2024-02-07 MED ADMIN — dicyclomine (BENTYL) capsule 10 mg: 10 mg | ORAL | @ 17:00:00

## 2024-02-07 MED ADMIN — prochlorperazine (COMPAZINE) tablet 5 mg: 5 mg | ORAL | @ 17:00:00

## 2024-02-07 MED ADMIN — acetaminophen (TYLENOL) tablet 1,000 mg: 1000 mg | ORAL | @ 13:00:00

## 2024-02-07 MED ADMIN — potassium chloride ER tablet 40 mEq: 40 meq | ORAL | @ 20:00:00 | Stop: 2024-02-07

## 2024-02-07 MED ADMIN — enoxaparin (LOVENOX) syringe 40 mg: 40 mg | SUBCUTANEOUS | @ 01:00:00

## 2024-02-07 MED ADMIN — predniSONE (DELTASONE) tablet 40 mg: 40 mg | ORAL | @ 13:00:00

## 2024-02-07 MED ADMIN — prochlorperazine (COMPAZINE) injection 5 mg: 5 mg | INTRAVENOUS | @ 11:00:00 | Stop: 2024-02-07

## 2024-02-07 MED ADMIN — potassium chloride ER tablet 40 mEq: 40 meq | ORAL | @ 17:00:00 | Stop: 2024-02-07

## 2024-02-07 MED ADMIN — oxyCODONE (ROXICODONE) immediate release tablet 5 mg: 5 mg | ORAL | @ 11:00:00 | Stop: 2024-02-17

## 2024-02-07 MED ADMIN — fluticasone furoate-vilanterol (BREO ELLIPTA) 100-25 mcg/dose inhaler 1 puff: 1 | RESPIRATORY_TRACT | @ 13:00:00

## 2024-02-07 MED ADMIN — prochlorperazine (COMPAZINE) injection 5 mg: 5 mg | INTRAVENOUS | @ 01:00:00

## 2024-02-07 MED ADMIN — magnesium sulfate 2gm/50mL IVPB: 2 g | INTRAVENOUS | @ 20:00:00 | Stop: 2024-02-07

## 2024-02-07 MED ADMIN — acetaminophen (TYLENOL) tablet 1,000 mg: 1000 mg | ORAL | @ 18:00:00

## 2024-02-07 MED ADMIN — oxyCODONE (ROXICODONE) immediate release tablet 5 mg: 5 mg | ORAL | @ 01:00:00 | Stop: 2024-02-17

## 2024-02-07 MED ADMIN — diphenhydrAMINE (BENADRYL) capsule/tablet 25 mg: 25 mg | ORAL | @ 11:00:00

## 2024-02-07 MED ADMIN — diphenhydrAMINE (BENADRYL) capsule/tablet 25 mg: 25 mg | ORAL | @ 17:00:00

## 2024-02-07 MED ADMIN — ferrous sulfate tablet 325 mg: 325 mg | ORAL | @ 13:00:00

## 2024-02-07 NOTE — Unmapped (Signed)
 Physician Discharge Summary [facility]  [address] [facility]  [address]  [place] [address]  Dept: [phone]  Loc: [phone]     Identifying Information:   Jerome Kennedy  1976-03-12  [id number]    Primary Care Physician: Agatha Glennon HERO, MD   Code Status: Full Code    Admit Date: 02/02/2024    Discharge Date: 02/07/2024     Discharge To: Home    Discharge Service: [facility]     Discharge Attending Physician: [name], MD    Discharge Diagnoses:  Principal Problem:    Exacerbation of Crohn's disease    (POA: Yes)  Active Problems:    Anemia (POA: Yes)    Hypomagnesemia (POA: Yes)    Bipolar 1 disorder    (POA: Yes)    COPD (chronic obstructive pulmonary disease)    (POA: Yes)    Hx of deep venous thrombosis (POA: Not Applicable)    Tobacco use disorder (POA: Yes)    Hypernatremia (POA: Unknown)  Resolved Problems:    * No resolved hospital problems. 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 Presented with 1 day of increased  abdominal pain, vomiting, and diarrhea along with elevated ESR/CRP. Etiology concerning for possible Crohn's flare vs chronically poorly controlled symptoms due to difficulty with medication compliance (uncertain if he completed recent prednisone  taper appropriately). First dose of Tremfya  completed 6/28. C. Diff negative. KUB without evidence of obstruction. Gi was consulted, and patient was started on IV methylprednisolone  20 mg q 8 hours. Given some increase in CRP, GI recommended CT abdomen/pelvis which revealed no evidence of penetrating disease / abscess but possible transition point and question of swirling vessel in internal hernia but no findings of obstruction. Clinically the patient did not have obstruction, and he improved rapidly with initiation of steroids. His diet was rapidly advanced to regular diet, and his abdominal pain and nausea significantly improved. CRP began trending down as well. GI did question if rapid improvement was due to steroid treatment of inflammation or if he had a partial obstruction that resolved with supportive care. As patient improved, they transitioned to prednisone  40 mg daily on 7/5 and patient was discharged on prednisone  taper (40mg  x 1 week, 30mg  x 1 week, 20mg  x 1 week, 15mg  x1 week, 10mg  x1 week, 5mg  x1 week, then stop). Patient was also prescribed with protonix  as he needed refill and compazine  PRN for nausea. Patient will be arranged with GI follow up.      Possible hypoxia: Hypoxia was not documented but placed on O2 2L in ED. However, patient weaned to RA without any issue.     Hypokalemia I Hypomagnesemia: Likely secondary to poor PO intake, and improved with some repletion. Low on day of discharge, but aggressively repleted on day of discharge. Instructed to have repeat BMP and Mg level later this week (order placed in Epic) and follow up with PCP.      Hypernatremia: Resolved. Suspect due to  poor p.o. intake and vomiting     Normocytic anemia: H/H stable in hospital. 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 He is not on lifelong anti-coagulation on advice of Heme/Onc due to bleeding risk with his IBD (see note from 2022 [facility] hospitalization). He is at elevated risk for clot in the setting of Crohn's flare. Was on DVT ppx during hospital stay.      Bipolar disorder: Continued home Vraylar  1.5 mg daily and Seroquel  100 mg daily     Procedures:  No admission procedures for hospital encounter.  ______________________________________________________________________  Discharge Medications:     Your Medication List        STOP taking these medications      dicyclomine  10 mg capsule  Commonly known as: BENTYL             START taking these medications      predniSONE  10 MG tablet  Commonly known as: DELTASONE   Take 4 tablets (40 mg total) by mouth daily for 6 days, THEN 3 tablets (30 mg total) daily for 7 days, THEN 2 tablets (20 mg total) daily for 7 days, THEN 1.5 tablets (15 mg total) daily for 7 days, THEN 1 tablet (10 mg total) daily for 7 days, THEN 0.5 tablets (5 mg total) daily for 7 days.  Start taking on: February 08, 2024     prochlorperazine  5 MG tablet  Commonly known as: COMPAZINE   Take 1 tablet (5 mg total) by mouth every six (6) hours as needed for up to 5 days.            CONTINUE taking these medications      cholecalciferol  (vitamin D3 25 mcg (1,000 units)) 1,000 unit (25 mcg) tablet  Take 1 tablet (25 mcg total) by mouth daily.     empty container Misc  Use as directed     ferrous sulfate  325 (65 FE) MG tablet  Take 1 tablet (325 mg total) by mouth in the morning.     FLONASE  ALLERGY RELIEF 50 mcg/actuation nasal spray  Generic drug: fluticasone  propionate  2 sprays into each nostril daily as needed.     nicotine  21 mg/24 hr patch  Commonly known as: NICODERM CQ   Place 1 patch on the skin daily.     ondansetron  4 MG disintegrating tablet  Commonly known as: ZOFRAN -ODT  Dissolve 1 tablet (4 mg total) in the mouth every eight (8) hours as needed for nausea.     OPTICHAMBER DIAMOND VHC Spcr  Generic drug: inhalational spacing device     pantoprazole  40 MG tablet  Commonly known as: Protonix   Take 1 tablet (40 mg total) by mouth daily.     QUEtiapine  100 MG tablet  Commonly known as: SEROQUEL   Take 1 tablet (100 mg total) by mouth nightly.     SYMBICORT  80-4.5 mcg/actuation inhaler  Generic drug: budesonide -formoterol   Inhale 2 puffs  in the morning.     TREMFYA  PEN INDUCTION PK-CROHN 200 mg/2 mL Pnij  Generic drug: guselkumab   Inject the contents of 2 pens (400 mg) under the skin every twenty-eight (28) days.     TREMFYA  PEN 200 mg/2 mL Pnij  Generic drug: guselkumab   Inject the contents of 1 pen (200 mg) under the skin every twenty-eight (28) days.  VENTOLIN  HFA 90 mcg/actuation inhaler  Generic drug: albuterol   Inhale 2 puffs every six (6) hours as needed.     VRAYLAR  1.5 mg capsule  Generic drug: cariprazine   Take 1 capsule (1.5 mg total) by mouth in the morning.              Allergies:  Morphine  and Toradol [ketorolac]  ______________________________________________________________________  Pending Test Results (if blank, then none):  Pending Labs       Order Current Status    Calprotectin, Stool In process            Most Recent Labs:  All lab results last 24 hours -   Recent Results (from the past 24 hours)   C-reactive protein    Collection Time: 02/07/24  9:43 AM   Result Value Ref Range    CRP 24.6 (H) <=10.0 mg/L   Magnesium  Level    Collection Time: 02/07/24  9:43 AM   Result Value Ref Range    Magnesium  1.4 (L) 1.6 - 2.6 mg/dL   Basic Metabolic Panel    Collection Time: 02/07/24  9:43 AM   Result Value Ref Range    Sodium 144 135 - 145 mmol/L    Potassium 3.1 (L) 3.4 - 4.8 mmol/L    Chloride 106 98 - 107 mmol/L    CO2 27.0 20.0 - 31.0 mmol/L    Anion Gap 11 5 - 14 mmol/L    BUN 8 (L) 9 - 23 mg/dL    Creatinine 9.08 9.26 - 1.18 mg/dL    BUN/Creatinine Ratio 9     eGFR CKD-EPI (2021) Male >90 >=60 mL/min/1.53m2    Glucose 91 70 - 179 mg/dL    Calcium  7.9 (L) 8.7 - 10.4 mg/dL   CBC    Collection Time: 02/07/24  9:44 AM   Result Value Ref Range    WBC 5.5 3.6 - 11.2 10*9/L    RBC 4.09 (L) 4.26 - 5.60 10*12/L    HGB 11.7 (L) 12.9 - 16.5 g/dL    HCT 65.2 (L) 60.9 - 48.0 %    MCV 84.8 77.6 - 95.7 fL    MCH 28.6 25.9 - 32.4 pg    MCHC 33.8 32.0 - 36.0 g/dL    RDW 81.5 (H) 87.7 - 15.2 %    MPV 7.7 6.8 - 10.7 fL    Platelet 186 150 - 450 10*9/L     Microbiology -   Microbiology Results (last day)       ** No results found for the last 24 hours. **            Relevant Studies/Radiology (if blank, then none):  CT Abdomen Pelvis W Contrast  Result Date: 02/06/2024  EXAM: CT ABDOMEN PELVIS W CONTRAST ACCESSION: [id number] UN REPORT DATE: 02/05/2024 10:59 PM CLINICAL INDICATION: 48 years old with Abdominal pain, assess for abscess  COMPARISON: CT abdomen pelvis 01/20/2024 TECHNIQUE: A helical CT scan of the abdomen and pelvis was obtained following IV contrast from the lung bases through the pubic symphysis. Images were reconstructed in the axial plane. Coronal and sagittal reformatted images were also provided for further evaluation. FINDINGS: LOWER CHEST: Chest wall collateral vessels. Large collateral along the right hemidiaphragm draining into the IVC. LIVER: Normal liver contour. Unchanged linear region of hyperattenuation within the right hepatic lobe (2:46-53). No suspicious or new liver lesions. BILIARY: The gallbladder is surgically absent.  No biliary ductal dilatation.  SPLEEN: Normal in size and contour. PANCREAS: Normal pancreatic contour.  No focal lesions.  No ductal dilation. ADRENAL GLANDS: Normal appearance of the adrenal glands. KIDNEYS/URETERS: Symmetric renal enhancement.  No hydronephrosis.  No solid renal mass. BLADDER: Unremarkable. REPRODUCTIVE ORGANS: Unremarkable. GI TRACT: Partially fluid-filled distal thoracic esophagus. Postsurgical changes right hemicolectomy and ileocecectomy with ileocolic anastomosis.  Duodenum is normal in course and caliber. No evidence of bowel obstruction. Bowel wall thickening of the rectum and sigmoid colon, similar to prior. There is resolution of the thickening of the neoterminal ileum. Bowel anteriorly is a posed and bulges the midline ventral abdominal wall where there is rectus diastasis. PERITONEUM, RETROPERITONEUM AND MESENTERY: No free air.  No ascites.  No fluid collection. LYMPH NODES: Prominent central mesenteric abdominal nodes appears similar to prior, likely reactive (2:64-86). VESSELS: Multiple collateral vessels along the thoracoabdominal wall, along the right hemidiaphragm, and paravertebral in this patient with severe stenosis/occlusion of the bilateral brachiocephalic, right subclavian, and right internal jugular veins, similar to prior. Hepatic and portal veins are patent.  Normal caliber aorta.  BONES and SOFT TISSUES: No aggressive osseous lesions.  No focal soft tissue lesions. Moderate disc degenerative changes and mild retrolisthesis at L4-5. Chronic bilateral rib fractures. Rectus diastasis with atrophy of the right rectus abdominis.     --No evidence of abscess formation within the abdomen or pelvis as clinically questioned. --Similar to CT from 01/20/2024, there is bowel wall thickening of the rectum and sigmoid colon which may be reflect infectious or inflammatory colitis. Interval resolution of the inflammatory changes of the neoterminal ileum. ==================== ADDENDUM (02/06/2024 11:47 AM): On review, the following additional findings were noted: Enteric contrast is seen to the level of the ileocolic anastomosis. Distended loops of colon is seen along the anterior abdomen to the level of the sigmoid:. There appears to be a transition point (2:84, 2:82). An internal hernia with evidence of swirling of vessels (2:57, 2:79 is queried. While this could reflect an internal hernia or possibly adhesive changes, there is no findings for obstruction at the time of this exam but may be the source related to this patient's persistent abdominal pain     XR Abdomen 1 View  Result Date: 02/04/2024  EXAM: XR ABDOMEN 1 VIEW ACCESSION: [id number] UN REPORT DATE: 02/04/2024 12:13 PM CLINICAL INDICATION: 48 years old with NAUSEA & VOMITING  COMPARISON: CT ABDOMEN PELVIS W CONTRAST 01/20/2024 TECHNIQUE: Supine view of the abdomen, 2 image(s) FINDINGS: Dilation of the transverse colon measuring 7.8 cm. Thickening of the descending colon haustra. No pneumoperitoneum. Right upper quadrant surgical clips. Small right pleural effusion with adjacent atelectasis. No acute osseous abnormalities. Soft tissues unremarkable.     Dilation of the transverse colon with thickening of the descending colon haustra. This is nonspecific and could be secondary to the patient's known inflammatory bowel disease with colitis. Correlate with clinical exam and any concern for toxic megacolon.     ECG 12 Lead  Result Date: 02/03/2024  NORMAL SINUS RHYTHM NORMAL ECG WHEN COMPARED WITH ECG OF 19-Jan-2024 14:39, NO SIGNIFICANT CHANGE WAS FOUND Confirmed by Antonetta Gull ([id number]) on 02/03/2024 8:08:16 AM   ______________________________________________________________________  Discharge Instructions:   Activity Instructions       Activity as tolerated              Diet Instructions       Discharge diet (specify)      Discharge Nutrition Therapy: Regular            Other Instructions       Discharge instructions  You were admitted for a likely Crohn's disease flare, and you improved with steroids. GI was consulted, and as you are now eating with much improved nausea and abdominal, you are ready for discharge on a prolonged steroid taper as recommended by GI. They will arrange outpatient follow up for you with GI. I recommend you see your primary care provider within the next week, and please call for an appointment. At the appointment, they should recheck your BMP and magnesium  level to check your potassium and magnesium . I have ordered the BMP and Magnesium  so you can also show up to [facility] lab station, and they can draw it for you in case there is a delay seeing your PCP.            Follow Up instructions and Outpatient Referrals     Discharge instructions      Basic metabolic panel      Is this a fasting order?: No    Release to patient: Immediate     BMP contains the following laboratory tests: NA, K, CL, CO2, BUN, CR,   GLUC, and CA.     Magnesium  Level      Release to patient: Immediate        Appointments which have been scheduled for you      Apr 19, 2024 11:30 AM  (Arrive by 11:15 AM)  RETURN IBD with Anette Arlean Pike, APRN  [facility] [place] [facility]) [address]  [facility] [address]  [place] [address]  [phone]             ______________________________________________________________________  Discharge Day Services:  BP 95/65  - Pulse 59  - Temp 36.7 ??C (98.1 ??F) (Oral)  - Resp 17  - Ht 172.7 cm (5' 7.99)  - Wt 68.9 kg (152 lb)  - SpO2 98%  - BMI 23.12 kg/m??   Pt seen on the day of discharge and determined appropriate for discharge.    Condition at Discharge: good    Length of Discharge: I spent greater than 30 mins in the discharge of this patient.

## 2024-02-07 NOTE — Unmapped (Signed)
 [facility]   Progress Note         Assessment and Recommendations:   Jerome Kennedy. is a 48 y.o. 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 The patient is seen in consultation at the request of [name], MD ([facility])) for Crohn's disease.    Patient with history of stricturing ileocolonic Crohn's disease status post multiple resections with 11/2023 colonoscopy demonstrating inflammation of neo-terminal ileum (Rutgeert's i3) and ongoing symptoms of active flare, recently started on guselkumab  (completed 2 self injections per patient) who was admitted for worsening abdominal pain and nausea (but not worsening diarrhea) after initial guselkumab  induction injections, found to have significant and rising CRP concerning for Crohn's flare but subsequent imaging suggestive of resolving volvulus. Since starting steroids, his abdominal pain has fully resolved, his CRP is significantly decreased, and he is tolerating a regular diet without issues. It remains unclear whether this rapid improvement was due to steroid treatment of inflammation or if he had a partial obstruction that resolved with supportive care. Nonetheless, he is improved and requesting discharge. I recommend discharge with a prednisone  taper and follow up with his [facility] GI provider in outpatient clinic. We will coordinate GI follow up; please prescribe prednisone  taper:    - Prednisone  taper: 40mg  x 1 week, 30mg  x 1 week, 20mg  x 1 week, 15mg  x1 week, 10mg  x1 week, 5mg  x1 week, then stop  - Prescribe Proton pump inhibitor at discharge (per patient request for acid reflux)    Issues Impacting Complexity of Management:  -None    Recommendations discussed with the patient's primary team. We will sign-off at this time, please re-contact if additional questions or a new need for consultation arises.    Subjective:   Tolerating regular diet, no emesis, had loose brown bowel movements 02/06/24. No abdominal pain    Objective:   Temp:  [36.7 ??C (98.1 ??F)] 36.7 ??C (98.1 ??F)  Pulse:  [59-62] 59  Resp:  [16-18] 17  BP: (95-121)/(63-76) 95/65  SpO2:  [97 %-99 %] 98 %    Gen: WDWN male in NAD, answers questions appropriately  Abdomen: Soft, large ventral hernia, tympanic, no abdominal pain to deep palpation.  Extremities: No edema in the BLEs    Pertinent Labs & Studies:  -I have reviewed the patient's labs from 02/07/24 which show improving CRP

## 2024-02-07 NOTE — Unmapped (Signed)
 A&Ox4. Received prn oxycodone , benadryl , and compazine  right before start of shift with effective results, reporting complete relief of all symptoms rating pain #0. Oxycodone  d/c'd and routine tylenol  started and received with prn bentyl , benadryl , and compazine  also effective. Received new orders for K+ and magnesium  replacement per order. Ready for discharge, awaiting cab voucher.

## 2024-02-07 NOTE — Unmapped (Signed)
 Shift Summary  Pain management was addressed with a decrease in abdominal pain after oxyCODONE  administration.    Environmental surveillance and disinfection were maintained to prevent hospital-acquired illnesses.    The unplanned readmission score showed minimal fluctuation, suggesting consistent care.    Medications including oxyCODONE , prochlorperazine , and diphenhydrAMINE  were administered as needed.    Overall, the patient's condition remained stable with effective pain management and infection prevention measures in place.     Absence of Hospital-Acquired Illness or Injury: Environmental surveillance and disinfection were performed, and personal protective equipment was utilized throughout the shift.     Optimal Comfort and Wellbeing: Abdominal pain decreased from a 6 to a 5 on the pain scale after administration of oxyCODONE .     Readiness for Transition of Care: The unplanned readmission score remained relatively stable throughout the shift.   Problem: Adult Inpatient Plan of Care  Goal: Plan of Care Review  Outcome: Shift Focus  Goal: Patient-Specific Goal (Individualized)  Outcome: Shift Focus  Goal: Absence of Hospital-Acquired Illness or Injury  Outcome: Shift Focus  Intervention: Identify and Manage Fall Risk  Recent Flowsheet Documentation  Taken 02/06/2024 2000 by Beverley Rily, RN  Safety Interventions:   fall reduction program maintained   environmental modification   lighting adjusted for tasks/safety   low bed   nonskid shoes/slippers when out of bed  Intervention: Prevent Infection  Recent Flowsheet Documentation  Taken 02/06/2024 2000 by Beverley Rily, RN  Infection Prevention:   environmental surveillance performed   equipment surfaces disinfected   hand hygiene promoted   personal protective equipment utilized   rest/sleep promoted  Goal: Optimal Comfort and Wellbeing  Outcome: Shift Focus  Goal: Readiness for Transition of Care  Outcome: Shift Focus     Problem: Infection  Goal: Absence of Infection Signs and Symptoms  Intervention: Prevent or Manage Infection  Recent Flowsheet Documentation  Taken 02/06/2024 2000 by Beverley Rily, RN  Infection Management: aseptic technique maintained  Isolation Precautions: enteric precautions maintained     Problem: Fall Injury Risk  Goal: Absence of Fall and Fall-Related Injury  Intervention: Promote Injury-Free Environment  Recent Flowsheet Documentation  Taken 02/06/2024 2000 by Beverley Rily, RN  Safety Interventions:   fall reduction program maintained   environmental modification   lighting adjusted for tasks/safety   low bed   nonskid shoes/slippers when out of bed

## 2024-02-08 LAB — BASIC METABOLIC PANEL
ANION GAP: 12 mmol/L (ref 5–14)
BLOOD UREA NITROGEN: 8 mg/dL — ABNORMAL LOW (ref 9–23)
BUN / CREAT RATIO: 9
CALCIUM: 8.2 mg/dL — ABNORMAL LOW (ref 8.7–10.4)
CHLORIDE: 107 mmol/L (ref 98–107)
CO2: 23 mmol/L (ref 20.0–31.0)
CREATININE: 0.88 mg/dL (ref 0.73–1.18)
EGFR CKD-EPI (2021) MALE: >90 mL/min/1.73m2 (ref >=60)
GLUCOSE RANDOM: 119 mg/dL (ref 70–179)
POTASSIUM: 3.6 mmol/L (ref 3.4–4.8)
SODIUM: 142 mmol/L (ref 135–145)

## 2024-02-08 LAB — C-REACTIVE PROTEIN: C-REACTIVE PROTEIN: 16.5 mg/L — ABNORMAL HIGH (ref <=10.0)

## 2024-02-08 LAB — MAGNESIUM: MAGNESIUM: 1.9 mg/dL (ref 1.6–2.6)

## 2024-02-08 LAB — CBC
HEMATOCRIT: 35.4 % — ABNORMAL LOW (ref 39.0–48.0)
HEMOGLOBIN: 11.9 g/dL — ABNORMAL LOW (ref 12.9–16.5)
MEAN CORPUSCULAR HEMOGLOBIN CONC: 33.6 g/dL (ref 32.0–36.0)
MEAN CORPUSCULAR HEMOGLOBIN: 28.4 pg (ref 25.9–32.4)
MEAN CORPUSCULAR VOLUME: 84.5 fL (ref 77.6–95.7)
MEAN PLATELET VOLUME: 7.8 fL (ref 6.8–10.7)
PLATELET COUNT: 202 10*9/L (ref 150–450)
RED BLOOD CELL COUNT: 4.19 10*12/L — ABNORMAL LOW (ref 4.26–5.60)
RED CELL DISTRIBUTION WIDTH: 18.6 % — ABNORMAL HIGH (ref 12.2–15.2)
WBC ADJUSTED: 7.6 10*9/L (ref 3.6–11.2)

## 2024-02-08 MED ORDER — PREDNISONE 10 MG TABLET
ORAL_TABLET | ORAL | 0 refills | 41.00000 days | Status: CP
Start: 2024-02-08 — End: 2024-03-20
  Filled 2024-02-07: qty 76, 34d supply, fill #0

## 2024-02-08 MED ADMIN — melatonin tablet 3 mg: 3 mg | ORAL

## 2024-02-08 MED ADMIN — acetaminophen (TYLENOL) tablet 1,000 mg: 1000 mg | ORAL

## 2024-02-08 MED ADMIN — ferrous sulfate tablet 325 mg: 325 mg | ORAL | @ 13:00:00 | Stop: 2024-02-08

## 2024-02-08 MED ADMIN — diphenhydrAMINE (BENADRYL) capsule/tablet 25 mg: 25 mg | ORAL

## 2024-02-08 MED ADMIN — cariprazine (VRAYLAR) capsule 1.5 mg: 1.5 mg | ORAL | @ 13:00:00 | Stop: 2024-02-08

## 2024-02-08 MED ADMIN — acetaminophen (TYLENOL) tablet 1,000 mg: 1000 mg | ORAL | @ 13:00:00 | Stop: 2024-02-08

## 2024-02-08 MED ADMIN — pantoprazole (Protonix) EC tablet 40 mg: 40 mg | ORAL | @ 13:00:00 | Stop: 2024-02-08

## 2024-02-08 MED ADMIN — potassium chloride ER tablet 40 mEq: 40 meq | ORAL | @ 13:00:00 | Stop: 2024-02-08

## 2024-02-08 MED ADMIN — cholecalciferol (vitamin D3 25 mcg (1,000 units)) tablet 25 mcg: 25 ug | ORAL | @ 13:00:00 | Stop: 2024-02-08

## 2024-02-08 MED ADMIN — nicotine (NICODERM CQ) 21 mg/24 hr patch 1 patch: 1 | TRANSDERMAL | @ 13:00:00 | Stop: 2024-02-08

## 2024-02-08 MED ADMIN — QUEtiapine (SEROQUEL) tablet 100 mg: 100 mg | ORAL

## 2024-02-08 MED ADMIN — predniSONE (DELTASONE) tablet 40 mg: 40 mg | ORAL | @ 13:00:00 | Stop: 2024-02-08

## 2024-02-08 MED ADMIN — fluticasone furoate-vilanterol (BREO ELLIPTA) 100-25 mcg/dose inhaler 1 puff: 1 | RESPIRATORY_TRACT | @ 13:00:00 | Stop: 2024-02-08

## 2024-02-08 MED ADMIN — enoxaparin (LOVENOX) syringe 40 mg: 40 mg | SUBCUTANEOUS

## 2024-02-08 NOTE — Unmapped (Signed)
 Shift Summary  Diphenhydramine  was administered for itching, resulting in a calm and relaxed state.    Aseptic technique and hand hygiene were consistently maintained to prevent infection.    The unplanned readmission score remained relatively stable throughout the shift.    Hourly visual checks confirmed the patient remained in bed with eyes closed, with no need for safety devices during transfers.    Overall, the patient maintained stability with no significant changes in condition.     Absence of Hospital-Acquired Illness or Injury: Aseptic technique was consistently maintained throughout the shift, and hand hygiene was promoted to support infection prevention.     Optimal Comfort and Wellbeing: Abdominal pain was reported as aching and acute with a pain level of 5; diphenhydramine  was administered for itching, and the patient was calm and relaxed afterward.     Readiness for Transition of Care: The unplanned readmission score showed minimal fluctuation, remaining relatively stable throughout the shift.     Absence of Fall and Fall-Related Injury: Hourly visual checks were performed, and the patient remained in bed with eyes closed, with safety devices not applicable for transfers.
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
## 2024-02-11 NOTE — Unmapped (Signed)
 Received message from schedulers that they are not able to get a hold of him to schedule an earlier visit. Called his friend listed, Sharene Kitty, who informed me patient has a new wi-fi phone and number is [phone]. Left a message at this number advising him to call to schedule a visit with me in July or August, as soon as possible. Also asked Laytoya to help pass along my message and she agreed.     Mercer Ruth MD   Gastroenterology Fellow

## 2024-02-12 DIAGNOSIS — D500 Iron deficiency anemia secondary to blood loss (chronic): Principal | ICD-10-CM

## 2024-02-12 NOTE — Unmapped (Addendum)
 THERAPY PLAN ORDER ENTRY DOCUMENTATION    The following order(s) have been placed for insurance authorization:    Therapy Plan Order  Medication: Ferumoxytol  Dose & Frequency: 510mg  IV X2doses  Diagnosis: IDA  Treatment Center: [facility]      Requesting Physician: Dr. Mercer Ruth    Notes:  difficult IV stick      Mitzie Smith Nevins, RN  [phone] office

## 2024-02-16 NOTE — Unmapped (Signed)
 Left VM advising pt that he needs more IV iron  and that he is approved for the infusion at [facility], instructed him to call [phone] to schedule.     Mercer Ruth MD   Gastroenterology Fellow

## 2024-02-23 NOTE — Unmapped (Signed)
 The [facility] has made a third and final attempt to reach this patient to refill the following medication:Tremfya .      We have left voicemails on the following phone numbers: [phone], have been unable to leave messages on the following phone numbers: [phone], and have sent a text message to the following phone numbers: [phone].    Dates contacted: 7/8, 7/14, 7/22  Last scheduled delivery: 6/20    The patient may be at risk of non-compliance with this medication. The patient should call the [facility] at [phone]  Option 4, then Option 2: Dermatology, Gastroenterology, Rheumatology to refill medication.    Therisa FORBES Ra, PharmD   [facility] Specialty Pharmacist

## 2024-02-26 ENCOUNTER — Emergency Department
Admit: 2024-02-26 | Discharge: 2024-02-27 | Disposition: A | Discharge status: Home / Self Care | Payer: Medicaid (Managed Care)

## 2024-02-26 DIAGNOSIS — K50919 Crohn's disease, unspecified, with unspecified complications: Principal | ICD-10-CM

## 2024-02-26 DIAGNOSIS — R1031 Right lower quadrant pain: Principal | ICD-10-CM

## 2024-02-26 LAB — CBC W/ AUTO DIFF
BASOPHILS ABSOLUTE COUNT: 0 10*9/L (ref 0.0–0.1)
BASOPHILS RELATIVE PERCENT: 0.6 %
EOSINOPHILS ABSOLUTE COUNT: 0.1 10*9/L (ref 0.0–0.5)
EOSINOPHILS RELATIVE PERCENT: 1.4 %
HEMATOCRIT: 35.9 % — ABNORMAL LOW (ref 39.0–48.0)
HEMOGLOBIN: 12.7 g/dL — ABNORMAL LOW (ref 12.9–16.5)
LYMPHOCYTES ABSOLUTE COUNT: 2.2 10*9/L (ref 1.1–3.6)
LYMPHOCYTES RELATIVE PERCENT: 31.4 %
MEAN CORPUSCULAR HEMOGLOBIN CONC: 35.3 g/dL (ref 32.0–36.0)
MEAN CORPUSCULAR HEMOGLOBIN: 30.1 pg (ref 25.9–32.4)
MEAN CORPUSCULAR VOLUME: 85.4 fL (ref 77.6–95.7)
MEAN PLATELET VOLUME: 7.6 fL (ref 6.8–10.7)
MONOCYTES ABSOLUTE COUNT: 0.5 10*9/L (ref 0.3–0.8)
MONOCYTES RELATIVE PERCENT: 7.5 %
NEUTROPHILS ABSOLUTE COUNT: 4.1 10*9/L (ref 1.8–7.8)
NEUTROPHILS RELATIVE PERCENT: 59.1 %
PLATELET COUNT: 237 10*9/L (ref 150–450)
RED BLOOD CELL COUNT: 4.21 10*12/L — ABNORMAL LOW (ref 4.26–5.60)
RED CELL DISTRIBUTION WIDTH: 18 % — ABNORMAL HIGH (ref 12.2–15.2)
WBC ADJUSTED: 7 10*9/L (ref 3.6–11.2)

## 2024-02-26 LAB — COMPREHENSIVE METABOLIC PANEL
ALBUMIN: 3.1 g/dL — ABNORMAL LOW (ref 3.4–5.0)
ALKALINE PHOSPHATASE: 120 U/L — ABNORMAL HIGH (ref 46–116)
ALT (SGPT): 8 U/L — ABNORMAL LOW (ref 10–49)
ANION GAP: 10 mmol/L (ref 5–14)
AST (SGOT): 12 U/L (ref <=34)
BILIRUBIN TOTAL: 0.6 mg/dL (ref 0.3–1.2)
BLOOD UREA NITROGEN: 12 mg/dL (ref 9–23)
BUN / CREAT RATIO: 12
CALCIUM: 8.2 mg/dL — ABNORMAL LOW (ref 8.7–10.4)
CHLORIDE: 111 mmol/L — ABNORMAL HIGH (ref 98–107)
CO2: 21 mmol/L (ref 20.0–31.0)
CREATININE: 1.02 mg/dL (ref 0.73–1.18)
EGFR CKD-EPI (2021) MALE: >90 mL/min/1.73m2 (ref >=60)
GLUCOSE RANDOM: 83 mg/dL (ref 70–179)
POTASSIUM: 3.2 mmol/L — ABNORMAL LOW (ref 3.4–4.8)
PROTEIN TOTAL: 6.1 g/dL (ref 5.7–8.2)
SODIUM: 142 mmol/L (ref 135–145)

## 2024-02-26 LAB — LIPASE: LIPASE: 46 U/L (ref 12–53)

## 2024-02-26 MED ADMIN — diphenhydrAMINE (BENADRYL) injection: 12.5 mg | INTRAVENOUS | @ 19:00:00 | Stop: 2024-02-26

## 2024-02-26 MED ADMIN — HYDROmorphone (PF) (DILAUDID) injection 0.5 mg: .5 mg | INTRAVENOUS | Stop: 2024-02-26

## 2024-02-26 MED ADMIN — iohexol (OMNIPAQUE) 350 mg iodine/mL solution 100 mL: 100 mL | INTRAVENOUS | @ 20:00:00 | Stop: 2024-02-26

## 2024-02-26 MED ADMIN — HYDROmorphone (PF) (DILAUDID) injection 0.5 mg: .5 mg | INTRAVENOUS | @ 21:00:00 | Stop: 2024-02-26

## 2024-02-26 MED ADMIN — HYDROmorphone (PF) (DILAUDID) injection 0.5 mg: .5 mg | INTRAVENOUS | @ 19:00:00 | Stop: 2024-02-26

## 2024-02-26 MED ADMIN — prochlorperazine (COMPAZINE) injection 10 mg: 10 mg | INTRAVENOUS | @ 19:00:00 | Stop: 2024-02-26

## 2024-02-26 MED ADMIN — prochlorperazine (COMPAZINE) injection 5 mg: 5 mg | INTRAVENOUS | @ 22:00:00 | Stop: 2024-02-26

## 2024-02-26 MED ADMIN — potassium chloride (KLOR-CON) packet 40 mEq: 40 meq | ORAL | @ 23:00:00 | Stop: 2024-02-26

## 2024-02-26 NOTE — Unmapped (Signed)
 [facility]  Emergency Department Provider Note     HPI     Jerome Kennedy. is a very pleasant 48 y.o. male Patient with a history of Crohn's disease, presenting emerged from today with nausea vomiting some diarrhea, abdominal pain, he says consistent with previous Crohn's flares, he states that his GI doctor is working on appropriate medication regimen, he states he has been having difficulty eating, secondary to his pain, prompting him to come to the emergency room today.  Denies any chest pain, some mild shortness of breath, no fevers chills.    MDM     BP 117/80  - Pulse 86  - Temp 36.7 ??C (98.1 ??F) (Oral)  - Resp 18  - Wt 68.9 kg (152 lb)  - SpO2 98%  - BMI 23.12 kg/m??      Hemodynamically stable. On exam, patient is overall well-appearing in no acute distress.  Regular rate and rhythm, no increased work of breathing abdomen nondistended, multiple abdominal hernias, minimal right lower quadrant tenderness, 2+ pulses in distal extremities, no leg swelling. Ddx:   Patient with known Crohn's disease, multiple abdominal hernias, with nausea vomiting abdominal pain, concern for worsening Crohn's flare, Crohn's complication such as ileitis, colitis, fistulous track, additionally consider other intra-abdominal pathology such as diverticulitis, appendicitis, cholecystitis, however pain pattern is not consistent with these pathologies this time.  Diagnostic workup as below. Disposition pending further work-up. See progress notes below.     Orders Placed This Encounter   Procedures    CT Abdomen Pelvis W IV Contrast    CBC w/ Differential    Comprehensive Metabolic Panel    Lipase Level    Urinalysis with Microscopy with Culture Reflex    ECG 12 Lead       ED Course as of 02/26/24 2111   Fri Feb 26, 2024   1534 Potassium(!): 3.2   1534 Creatinine: 1.02   1823 CT Abdomen Pelvis W IV Contrast  IMPRESSION:  Interval development of acute upon chronic changes of perianastomotic Crohn's disease. Interval decrease in colonic distention. No abscess or obstruction. Mild Crohn's proctocolitis.     Additional incidental ancillary findings, as above.      2016 Patient has tolerated p.o., his KCl has been repleted, he is agreeable plans for discharge, recommend follow-up with his PCP, GI.  Provided return precautions.       The case was discussed with the attending physician who is in agreement with the above assessment and plan.    - Any discussion of this patient's case/presentation between myself and consultants, admitting teams, or other team members has been documented above.  - Imaging and other studies: N/A  - External records reviewed: Previous discharge summary from 02/08/2024 for additional past medical history.  - Consideration of admission, observation, transfer, or escalation of care: Yes, however patient was deemed appropriate for outpatient management, please see ED course and MDM for further clinical reasoning.     ED Clinical Impression     Final diagnoses:   Right lower quadrant abdominal pain (Primary)   Crohn's disease with complication, unspecified gastrointestinal tract location           Physical Exam     Vitals:    02/26/24 1148 02/26/24 1152 02/26/24 1743 02/26/24 2024   BP:  95/72 111/71 117/80   Pulse: 80  82 86   Resp:  16 17 18    Temp:  36.6 ??C (97.9 ??F)  36.7 ??C (98.1 ??F)   TempSrc:  Oral  Oral   SpO2: 100%  99% 98%   Weight:  68.9 kg (152 lb)          Constitutional: In no acute distress.  Eyes: Conjunctivae are normal. EOMI.   HEENT: Normocephalic and atraumatic. Mucous membranes are moist.   Neck: Full active range of motion  Cardiovascular: See heart rate listed above.  Normal skin perfusion.  2+ pulses in distal extremities, no lower extremity edema.   Respiratory: See respiratory rate listed above.   Speaking easily in full sentences  Gastrointestinal: Soft, nondistended, multiple abdominal hernias, mild tenderness in the right lower quadrant.  Musculoskeletal: No long bone deformities.   Neurologic: Normal speech and language. No gross focal neurologic deficits are appreciated.   Skin: Skin is warm, dry and intact.  Psychiatric: Mood and affect are normal.     Past History     PAST MEDICAL HISTORY/PAST SURGICAL HISTORY:   Past Medical History[1]    Past Surgical History[2]    MEDICATIONS:   No current facility-administered medications for this encounter.    Current Outpatient Medications:     cariprazine  (VRAYLAR ) 1.5 mg capsule, Take 1 capsule (1.5 mg total) by mouth in the morning., Disp: , Rfl:     cholecalciferol , vitamin D3 25 mcg, 1,000 units,, 1,000 unit (25 mcg) tablet, Take 1 tablet (25 mcg total) by mouth daily., Disp: 30 tablet, Rfl: 0    empty container Misc, Use as directed, Disp: 1 each, Rfl: 1    ferrous sulfate  325 (65 FE) MG tablet, Take 1 tablet (325 mg total) by mouth in the morning., Disp: , Rfl:     FLONASE  ALLERGY RELIEF 50 mcg/actuation nasal spray, 2 sprays into each nostril daily as needed., Disp: , Rfl:     guselkumab  (TREMFYA  PEN INDUCTION PK-CROHN) 200 mg/2 mL PnIj, Inject the contents of 2 pens (400 mg) under the skin every twenty-eight (28) days., Disp: 4 mL, Rfl: 2    guselkumab  (TREMFYA  PEN) 200 mg/2 mL PnIj, Inject the contents of 1 pen (200 mg) under the skin every twenty-eight (28) days., Disp: 2 mL, Rfl: 5    nicotine  (NICODERM CQ ) 21 mg/24 hr patch, Place 1 patch on the skin daily., Disp: 28 patch, Rfl: 0    ondansetron  (ZOFRAN -ODT) 4 MG disintegrating tablet, Dissolve 1 tablet (4 mg total) in the mouth every eight (8) hours as needed for nausea., Disp: 21 tablet, Rfl: 0    OPTICHAMBER DIAMOND VHC Spcr, , Disp: , Rfl:     pantoprazole  (PROTONIX ) 40 MG tablet, Take 1 tablet (40 mg total) by mouth daily., Disp: 90 tablet, Rfl: 0    predniSONE  (DELTASONE ) 10 MG tablet, Take 4 tablets (40 mg total) by mouth daily for 6 days, THEN 3 tablets (30 mg total) daily for 7 days, THEN 2 tablets (20 mg total) daily for 7 days, THEN 1.5 tablets (15 mg total) daily for 7 days, THEN 1 tablet (10 mg total) daily for 7 days, THEN 0.5 tablets (5 mg total) daily for 7 days., Disp: 80 tablet, Rfl: 0    QUEtiapine  (SEROQUEL ) 100 MG tablet, Take 1 tablet (100 mg total) by mouth nightly., Disp: , Rfl:     SYMBICORT  80-4.5 mcg/actuation inhaler, Inhale 2 puffs  in the morning., Disp: 10.2 g, Rfl: 0    VENTOLIN  HFA 90 mcg/actuation inhaler, Inhale 2 puffs every six (6) hours as needed., Disp: 18 g, Rfl: 0    ALLERGIES:   Morphine  and Toradol [ketorolac]  SOCIAL HISTORY:   Social History     Tobacco Use    Smoking status: Every Day     Current packs/day: 1.00     Average packs/day: 0.6 packs/day for 22.4 years (12.9 ttl pk-yrs)     Types: Cigarettes     Start date: 10/02/2001     Passive exposure: Current    Smokeless tobacco: Never    Tobacco comments:     1ppd   Substance Use Topics    Alcohol use: No     Alcohol/week: 0.0 standard drinks of alcohol       FAMILY HISTORY:  Family History[3]      Radiology     CT Abdomen Pelvis W IV Contrast   Final Result   Interval development of acute upon chronic changes of perianastomotic Crohn's disease. Interval decrease in colonic distention. No abscess or obstruction. Mild Crohn's proctocolitis.      Additional incidental ancillary findings, as above.          I, Adine Caraway, M.D., have personally reviewed the images and concur with the resident preliminary report above. This report now represents the final report for this patient.           Laboratory Data     Lab Results   Component Value Date    WBC 7.0 02/26/2024    HGB 12.7 (L) 02/26/2024    HCT 35.9 (L) 02/26/2024    PLT 237 02/26/2024       Lab Results   Component Value Date    NA 142 02/26/2024    K 3.2 (L) 02/26/2024    CL 111 (H) 02/26/2024    CO2 21.0 02/26/2024    BUN 12 02/26/2024    CREATININE 1.02 02/26/2024    GLU 83 02/26/2024    CALCIUM  8.2 (L) 02/26/2024    MG 1.9 02/08/2024    PHOS 2.2 (L) 12/02/2023       Lab Results   Component Value Date    BILITOT 0.6 02/26/2024    BILIDIR 0.20 04/26/2023 PROT 6.1 02/26/2024    ALBUMIN 3.1 (L) 02/26/2024    ALT 8 (L) 02/26/2024    AST 12 02/26/2024    ALKPHOS 120 (H) 02/26/2024    GGT 39 02/03/2024       Lab Results   Component Value Date    LABPROT 12.0 01/08/2013    INR 0.99 10/18/2023    APTT 22.4 (L) 10/18/2023       Morene Ates DO  PGY3 EM    Portions of this record have been created using Dragon dictation software. Dictation errors have been sought, but may not have been identified and corrected.           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    Myalgia     Rhinovirus infection 10/29/2022    SBO (small bowel obstruction)  2011   [2]   Past Surgical History:  Procedure Laterality Date    CHOLECYSTECTOMY      COLON SURGERY      partial resection    HERNIA REPAIR      PR ARTHRP INTERCARPAL/CARP/MTCRPL JT INTERPOSITION Left 08/07/2022    Procedure: INTERPOSIT ARTHROPLASTY-INTERCARPAL/CARPOMETACAR;  Surgeon: Bonifacio Cordella HERO, MD;  Location: MAIN OR [facility];  Service: Orthopedics    PR CLOSE ENTEROSTOMY,RESEC+COLOREC ANAS Midline 11/01/2019    Procedure: CLO ENTEROSTOMY; W/RESECT AUGIE FLAKES;  Surgeon: Aloysius Euell Quale, MD;  Location: MAIN OR [place];  Service: Gastrointestinal    PR COLONOSCOPY FLX DX W/COLLJ SPEC WHEN PFRMD Left 01/07/2013    Procedure: COLONOSCOPY, FLEXIBLE, PROXIMAL TO SPLENIC FLEXURE; DIAGNOSTIC, W/WO COLLECTION SPECIMEN BY BRUSH OR WASH;  Surgeon: Liliane JENEANE Marker, MD;  [facility]: [facility]URES [facility];  Service: Gastroenterology    PR COLONOSCOPY FLX DX W/COLLJ SPEC WHEN PFRMD  03/09/2015 Procedure: COLONOSCOPY, FLEXIBLE, PROXIMAL TO SPLENIC FLEXURE; DIAGNOSTIC, W/WO COLLECTION SPECIMEN BY BRUSH OR WASH;  Surgeon: Artist Jayson Leather, MD;  Location: GI PROCEDURES [facility];  Service: Gastroenterology    PR COLONOSCOPY FLX DX W/COLLJ SPEC WHEN PFRMD N/A 10/28/2019    Procedure: COLONOSCOPY, FLEXIBLE, PROXIMAL TO SPLENIC FLEXURE; DIAGNOSTIC, W/WO COLLECTION SPECIMEN BY BRUSH OR WASH;  Surgeon: Derick Bring, MD;  Location: GI PROCEDURES [facility];  Service: Gastroenterology    PR COLONOSCOPY FLX DX W/COLLJ SPEC WHEN PFRMD N/A 10/01/2022    Procedure: COLONOSCOPY, FLEXIBLE, PROXIMAL TO SPLENIC FLEXURE; DIAGNOSTIC, W/WO COLLECTION SPECIMEN BY BRUSH OR WASH;  Surgeon: Darrick Peck, MD;  Location: GI PROCEDURES [facility];  Service: Gastroenterology    PR COLONOSCOPY FLX DX W/COLLJ SPEC WHEN PFRMD N/A 11/30/2023    Procedure: COLONOSCOPY, FLEXIBLE, PROXIMAL TO SPLENIC FLEXURE; DIAGNOSTIC, W/WO COLLECTION SPECIMEN BY BRUSH OR WASH;  Surgeon: Audrey Lacinda Hun, MD;  Location: GI PROC[facility]URES [facility];  Service: Gastroenterology    PR EXPLORATORY OF ABDOMEN Midline 09/02/2019    Procedure: EXPLORATORY LAPAROTOMY, EXPLORATORY CELIOTOMY WITH OR WITHOUT BIOPSY(S);  Surgeon: Aloysius Euell Quale, MD;  Location: MAIN OR [facility];  Service: Gastrointestinal    PR EXPLORATORY OF ABDOMEN Midline 11/01/2019    Procedure: EXPLORATORY LAPAROTOMY, EXPLORATORY CELIOTOMY WITH OR WITHOUT BIOPSY(S);  Surgeon: Aloysius Euell Quale, MD;  Location: MAIN OR [facility];  Service: Gastrointestinal    PR FIX FINGER,VOLAR PLATE,I-P JT Left 08/07/2022    Procedure: REPAIR AND RECONSTRUCTION, FINGER, VOLAR PLATE, INTERPHALANGEAL JOINT;  Surgeon: Bonifacio Cordella HERO, MD;  Location: MAIN OR [facility];  Service: Orthopedics    PR FREEING BOWEL ADHESION,ENTEROLYSIS N/A 11/01/2019    Procedure: Enterolysis (Separt Proc);  Surgeon: Aloysius Euell Quale, MD;  Location: MAIN OR [facility];  Service: Gastrointestinal    PR FUSION MC-P JT Left 08/27/2023 Procedure: ARTHRODESIS, METACARPOPHALANGEAL JOINT, WITH OR WITHOUT INTERNAL FIXATION;  Surgeon: Bonifacio Cordella HERO, MD;  Location: OR [facility];  Service: Orthopedics    PR ILEOSCOPY THRU STOMA,BIOPSY N/A 10/14/2019    Procedure: LYNETT; LORN 1/MX;  Surgeon: Thedora Alm Plain, MD;  Location: GI PROCEDURES [facility];  Service: Gastroenterology    PR PART REMOVAL COLON W COLOSTOMY Midline 09/02/2019    Procedure: COLECTOMY, PARTIAL; WITH SKIN LEVEL CECOSTOMY OR COLOSTOMY;  Surgeon: Aloysius Euell Quale, MD;  Location: MAIN OR [facility];  Service: Gastrointestinal    PR REPAIR INCISIONAL HERNIA,REDUCIBLE Midline 09/02/2019    Procedure: REPAIR INIT INCISIONAL OR VENTRAL HERNIA; REDUCIBLE;  Surgeon: Arnette Darice Burnet, MD;  Location: MAIN OR [place];  Service: Trauma    PR UPPER  GI ENDOSCOPY,BIOPSY N/A 01/07/2013    Procedure: UGI ENDOSCOPY; WITH BIOPSY, SINGLE OR MULTIPLE;  Surgeon: Liliane JENEANE Marker, MD;  Location: GI PROCEDURES [facility];  Service: Gastroenterology    PR UPPER GI ENDOSCOPY,BIOPSY N/A 10/14/2019    Procedure: UGI ENDOSCOPY; WITH BIOPSY, SINGLE OR MULTIPLE;  Surgeon: Thedora Alm Plain, MD;  Location: GI PROCEDURES [facility];  Service: Gastroenterology    PR UPPER GI ENDOSCOPY,DIAGNOSIS N/A 10/01/2022    Procedure: UGI ENDO, INCLUDE ESOPHAGUS, STOMACH, & DUODENUM &/OR JEJUNUM; DX W/WO COLLECTION SPECIMN, BY BRUSH OR WASH;  Surgeon: Darrick Peck, MD;  Location: GI PROCEDURES [facility];  Service: Gastroenterology   [3]   Family History  Problem Relation Age of Onset    Breast cancer Mother     Diabetes Maternal Aunt     Colon cancer Maternal Grandmother     Prostate cancer Maternal Grandfather     Cancer Paternal Grandmother     Crohn's disease Neg Hx         Alain Morene SQUIBB, DO  Resident  02/26/24 2111

## 2024-02-26 NOTE — Unmapped (Signed)
 BIB [facility] from home for abd pain, hx Crohn's. 4mg  Zofran  ODT.

## 2024-02-27 MED ADMIN — diphenhydrAMINE (BENADRYL) injection: 12.5 mg | INTRAVENOUS | @ 01:00:00 | Stop: 2024-02-26

## 2024-02-29 NOTE — Unmapped (Signed)
 Approval in place for feraheme but infusion center has not been able to reach patient to schedule recommended infusions.

## 2024-03-06 DIAGNOSIS — D500 Iron deficiency anemia secondary to blood loss (chronic): Principal | ICD-10-CM

## 2024-03-07 NOTE — Unmapped (Signed)
 [facility] Clinical Assessment & Refill Coordination Note    -Patient is waiting on glasses to come to read credit card, he will have it next month. Does not have funds at this moment.  Offered to send RX locally next month for pick-up  -Reviewed q4week dosing with patient and also update phone number on file so we can reach him       Jerome Kennedy., DOB: [birth year]  Phone: [phone] (home)     All above HIPAA information was verified with patient.     Was a Nurse, learning disability used for this call? No    Specialty Medication(s):   Inflammatory Disorders: Tremfya      Current Medications[1]     Changes to medications: Zalman reports no changes at this time.    Medication list has been reviewed and updated in Epic: Yes    Allergies[2]    Changes to allergies: No    Allergies have been reviewed and updated in Epic: Yes    SPECIALTY MEDICATION ADHERENCE     Medication Adherence    Patient reported X missed doses in the last month: 1  Specialty Medication: trmefya  Patient is on additional specialty medications: No  Informant: patient          Specialty medication(s) dose(s) confirmed: Regimen is correct and unchanged.     Are there any concerns with adherence? No    Adherence counseling provided? Not needed    CLINICAL MANAGEMENT AND INTERVENTION      Clinical Benefit Assessment:    Do you feel the medicine is effective or helping your condition? Yes    Clinical Benefit counseling provided? Not needed    Adverse Effects Assessment:    Are you experiencing any side effects? No    Are you experiencing difficulty administering your medicine? No    Quality of Life Assessment:    Quality of Life    Rheumatology  Oncology  Dermatology  Cystic Fibrosis          How many days over the past month did your crohn's  keep you from your normal activities? For example, brushing your teeth or getting up in the morning. Patient declined to answer    Have you discussed this with your provider? Yes    Acute Infection Status:    Acute infections noted within Epic:  No active infections    Patient reported infection: None    Therapy Appropriateness:    Is therapy appropriate based on current medication list, adverse reactions, adherence, clinical benefit and progress toward achieving therapeutic goals? Yes, therapy is appropriate and should be continued     Clinical Intervention:    Was an intervention completed as part of this clinical assessment? No    DISEASE/MEDICATION-SPECIFIC INFORMATION      For patients on injectable medications: Patient currently has 0 doses left.  Next injection is scheduled for ASAP, 8/5.    Chronic Inflammatory Diseases: Have you experienced any flares in the last month? Yes, pt was recently hospitalized  Has this been reported to your provider? Yes, clinic aware    PATIENT SPECIFIC NEEDS     Does the patient have any physical, cognitive, or cultural barriers? No    Is the patient high risk? No    Does the patient require physician intervention or other additional services (i.e., nutrition, smoking cessation, social work)? No    Does the patient have an additional or emergency contact listed in their chart? Yes  SOCIAL DETERMINANTS OF HEALTH     At the [facility], we have learned that life circumstances - like trouble affording food, housing, utilities, or transportation can affect the health of many of our patients.   That is why we wanted to ask: are you currently experiencing any life circumstances that are negatively impacting your health and/or quality of life? Patient declined to answer    Social Drivers of Health     Food Insecurity: No Food Insecurity (12/29/2023)    Hunger Vital Sign     Worried About Running Out of Food in the Last Year: Never true     Ran Out of Food in the Last Year: Never true   Tobacco Use: High Risk (02/26/2024)    Patient History     Smoking Tobacco Use: Every Day     Smokeless Tobacco Use: Never     Passive Exposure: Current   Transportation Needs: No Transportation Needs (12/29/2023)    PRAPARE - Transportation     Lack of Transportation (Medical): No     Lack of Transportation (Non-Medical): No   Alcohol Use: Not on file   Housing: Low Risk  (12/29/2023)    Housing     Within the past 12 months, have you ever stayed: outside, in a car, in a tent, in an overnight shelter, or temporarily in someone else's home (i.e. couch-surfing)?: No     Are you worried about losing your housing?: No   Physical Activity: Not on File (06/21/2021)    Received from [facility]    Physical Activity     Physical Activity: 0   Utilities: Low Risk  (12/29/2023)    Utilities     Within the past 12 months, have you been unable to get utilities (heat, electricity) when it was really needed?: No   Stress: Not on File (06/21/2021)    Received from [facility]    Stress     Stress: 0   Interpersonal Safety: Not At Risk (02/26/2024)    Interpersonal Safety     Unsafe Where You Currently Live: No     Physically Hurt by Anyone: No     Abused by Anyone: No   Substance Use: Not on file (06/10/2023)   Intimate Partner Violence: Not At Risk (02/26/2024)    Humiliation, Afraid, Rape, and Kick questionnaire     Fear of Current or Ex-Partner: No     Emotionally Abused: No     Physically Abused: No     Sexually Abused: No   Social Connections: Not on File (04/18/2023)    Received from [facility]    Social Connections     Connectedness: 0   Financial Resource Strain: Low Risk  (12/29/2023)    Overall Financial Resource Strain (CARDIA)     Difficulty of Paying Living Expenses: Not hard at all   Health Literacy: Not on file   Internet Connectivity: Not on file       Would you be willing to receive help with any of the needs that you have identified today? Not applicable       SHIPPING     Specialty Medication(s) to be Shipped:   Inflammatory Disorders: Tremfya     Other medication(s) to be shipped: No additional medications requested for fill at this time     Changes to insurance: No    Cost and Payment: Patient aware of $4 copay but does not have funds at this time.     Delivery Scheduled: Yes, Expected medication  delivery date: 8/5.     Medication will be delivered via Same Day Courier to the confirmed prescription address in [facility].    The patient will receive a drug information handout for each medication shipped and additional FDA Medication Guides as required.  Verified that patient has previously received a Conservation officer, historic buildings and a Surveyor, mining.    The patient or caregiver noted above participated in the development of this care plan and knows that they can request review of or adjustments to the care plan at any time.      All of the patient's questions and concerns have been addressed.    Rosalynn GORMAN Kin, PharmD   [facility] Specialty Pharmacist       [1]   Current Outpatient Medications   Medication Sig Dispense Refill    cariprazine  (VRAYLAR ) 1.5 mg capsule Take 1 capsule (1.5 mg total) by mouth in the morning.      cholecalciferol , vitamin D3 25 mcg, 1,000 units,, 1,000 unit (25 mcg) tablet Take 1 tablet (25 mcg total) by mouth daily. 30 tablet 0    empty container Misc Use as directed 1 each 1    ferrous sulfate  325 (65 FE) MG tablet Take 1 tablet (325 mg total) by mouth in the morning.      FLONASE  ALLERGY RELIEF 50 mcg/actuation nasal spray 2 sprays into each nostril daily as needed.      guselkumab  (TREMFYA  PEN INDUCTION PK-CROHN) 200 mg/2 mL PnIj Inject the contents of 2 pens (400 mg) under the skin every twenty-eight (28) days. 4 mL 2    guselkumab  (TREMFYA  PEN) 200 mg/2 mL PnIj Inject the contents of 1 pen (200 mg) under the skin every twenty-eight (28) days. 2 mL 5    nicotine  (NICODERM CQ ) 21 mg/24 hr patch Place 1 patch on the skin daily. 28 patch 0    ondansetron  (ZOFRAN -ODT) 4 MG disintegrating tablet Dissolve 1 tablet (4 mg total) in the mouth every eight (8) hours as needed for nausea. 21 tablet 0    OPTICHAMBER DIAMOND [facility] Spcr       pantoprazole  (PROTONIX ) 40 MG tablet Take 1 tablet (40 mg total) by mouth daily. 90 tablet 0    predniSONE  (DELTASONE ) 10 MG tablet Take 4 tablets (40 mg total) by mouth daily for 6 days, THEN 3 tablets (30 mg total) daily for 7 days, THEN 2 tablets (20 mg total) daily for 7 days, THEN 1.5 tablets (15 mg total) daily for 7 days, THEN 1 tablet (10 mg total) daily for 7 days, THEN 0.5 tablets (5 mg total) daily for 7 days. 80 tablet 0    QUEtiapine  (SEROQUEL ) 100 MG tablet Take 1 tablet (100 mg total) by mouth nightly.      SYMBICORT  80-4.5 mcg/actuation inhaler Inhale 2 puffs  in the morning. 10.2 g 0    VENTOLIN  HFA 90 mcg/actuation inhaler Inhale 2 puffs every six (6) hours as needed. 18 g 0     No current facility-administered medications for this visit.   [2]   Allergies  Allergen Reactions    Morphine  Itching and Rash     He gets light headed    Toradol [Ketorolac] Itching

## 2024-03-08 MED FILL — TREMFYA PEN INDUCTION PACK-CROHN 200 MG/2 ML SUBCUTANEOUS PEN INJECTOR: SUBCUTANEOUS | 28 days supply | Qty: 4 | Fill #1

## 2024-03-15 ENCOUNTER — Emergency Department
Admit: 2024-03-15 | Discharge: 2024-03-16 | Disposition: A | Discharge status: Home / Self Care | Payer: Medicaid (Managed Care)

## 2024-03-15 DIAGNOSIS — R1031 Right lower quadrant pain: Principal | ICD-10-CM

## 2024-03-15 DIAGNOSIS — Z8719 Personal history of other diseases of the digestive system: Principal | ICD-10-CM

## 2024-03-15 LAB — CBC W/ AUTO DIFF
BASOPHILS ABSOLUTE COUNT: 0 10*9/L (ref 0.0–0.1)
BASOPHILS RELATIVE PERCENT: 0.6 %
EOSINOPHILS ABSOLUTE COUNT: 0.1 10*9/L (ref 0.0–0.5)
EOSINOPHILS RELATIVE PERCENT: 1.5 %
HEMATOCRIT: 37.2 % — ABNORMAL LOW (ref 39.0–48.0)
HEMOGLOBIN: 12.5 g/dL — ABNORMAL LOW (ref 12.9–16.5)
LYMPHOCYTES ABSOLUTE COUNT: 1.5 10*9/L (ref 1.1–3.6)
LYMPHOCYTES RELATIVE PERCENT: 25.3 %
MEAN CORPUSCULAR HEMOGLOBIN CONC: 33.7 g/dL (ref 32.0–36.0)
MEAN CORPUSCULAR HEMOGLOBIN: 29.6 pg (ref 25.9–32.4)
MEAN CORPUSCULAR VOLUME: 88 fL (ref 77.6–95.7)
MEAN PLATELET VOLUME: 7.8 fL (ref 6.8–10.7)
MONOCYTES ABSOLUTE COUNT: 0.7 10*9/L (ref 0.3–0.8)
MONOCYTES RELATIVE PERCENT: 11.8 %
NEUTROPHILS ABSOLUTE COUNT: 3.7 10*9/L (ref 1.8–7.8)
NEUTROPHILS RELATIVE PERCENT: 60.8 %
PLATELET COUNT: 215 10*9/L (ref 150–450)
RED BLOOD CELL COUNT: 4.23 10*12/L — ABNORMAL LOW (ref 4.26–5.60)
RED CELL DISTRIBUTION WIDTH: 16 % — ABNORMAL HIGH (ref 12.2–15.2)
WBC ADJUSTED: 6.1 10*9/L (ref 3.6–11.2)

## 2024-03-15 LAB — COMPREHENSIVE METABOLIC PANEL
ALBUMIN: 3.2 g/dL — ABNORMAL LOW (ref 3.4–5.0)
ALKALINE PHOSPHATASE: 112 U/L (ref 46–116)
ALT (SGPT): 14 U/L (ref 10–49)
ANION GAP: 11 mmol/L (ref 5–14)
AST (SGOT): 15 U/L (ref <=34)
BILIRUBIN TOTAL: 0.3 mg/dL (ref 0.3–1.2)
BLOOD UREA NITROGEN: 11 mg/dL (ref 9–23)
BUN / CREAT RATIO: 10
CALCIUM: 9 mg/dL (ref 8.7–10.4)
CHLORIDE: 106 mmol/L (ref 98–107)
CO2: 26 mmol/L (ref 20.0–31.0)
CREATININE: 1.08 mg/dL (ref 0.73–1.18)
EGFR CKD-EPI (2021) MALE: 85 mL/min/1.73m2 (ref >=60)
GLUCOSE RANDOM: 102 mg/dL (ref 70–179)
POTASSIUM: 3.4 mmol/L (ref 3.4–4.8)
PROTEIN TOTAL: 6.5 g/dL (ref 5.7–8.2)
SODIUM: 143 mmol/L (ref 135–145)

## 2024-03-15 LAB — LIPASE: LIPASE: 41 U/L (ref 12–53)

## 2024-03-15 MED ADMIN — HYDROmorphone (PF) (DILAUDID) injection 1 mg: 1 mg | INTRAVENOUS | @ 23:00:00 | Stop: 2024-03-15

## 2024-03-15 MED ADMIN — diphenhydrAMINE (BENADRYL) capsule/tablet 25 mg: 25 mg | ORAL | @ 23:00:00 | Stop: 2024-03-15

## 2024-03-15 MED ADMIN — prochlorperazine (COMPAZINE) injection 5 mg: 5 mg | INTRAVENOUS | @ 23:00:00 | Stop: 2024-03-15

## 2024-03-15 MED ADMIN — famotidine (PF) (PEPCID) injection 20 mg: 20 mg | INTRAVENOUS | Stop: 2024-03-15

## 2024-03-15 NOTE — Unmapped (Signed)
 BIB [facility] c/o RLQ abdominal pain. Hx of Crohn's.

## 2024-03-15 NOTE — Unmapped (Signed)
 [facility]  Emergency Department Provider Note      ED Clinical Impression       Diagnosis ICD-10-CM Associated Orders   1. Right lower quadrant abdominal pain  R10.31       2. History of Crohn's disease  Z87.19                Impression, Medical Decision Making, Progress Notes and Critical Care      Impression, Differential Diagnosis and Plan of Care    Patient is a 48 y.o. male with PMH of Crohn's disease of small and large intestine, COPD, RUE DVT, left CMC joint arthritis, and bipolar 1 disorder presenting for constant, intense RLQ abdominal pain with associated nausea, 3 episodes of emesis, diminished appetite, and diarrhea since 2 PM today. Of note, he states this is consistent with previous Crohn's flares.     On exam, the patient appears uncomfortable but is in NAD. VS are within normal limits. RLQ tenderness to palpation.    Differential includes chron's flare, appendicitis, incarcerated vs. Strangulated hernia, viral gastroenteritis, acute blood loss anemia, pancreatitis, electrolyte abnormalities, SBP, diverticulitis, malignering and SBO.     Plan for CBC, CMP, lipase, UA w/ reflex, and EKG. Will give Benadryl  PO, Dilaudid , and Compazine .     Patient states has allergy to morphine  and toradol, also states compazine  is only thing that helps with nausea, he has known hx of chron's with recent acute flare. Will give dilaudid  and compazine  IV, but benadryl  PO. Given hx and exam likely having Chron's flare but will need to reassess after meds to consider further testing such as CT Abdomen Pelvis, given amount of CT's in past 2 months will opt to avoid unnecessary radiation risk, patient hemodynamically stable.  CBC showing no leukocytosis, hemoglobin near baseline, CMP and lipase unremarkable.  Patient stating that he has follow-up with GI soon to be placed on Skyrizi (monoclonal antibody), currently not on daily medications including steroids, will consider steroid burst after reevaluation.  Patient eloped from the ED, unable to reevaluate the patient to see symptom improvement and need for further testing including imaging, urinalysis was never obtained.    Additional MDM Elements           Independent interpretation: EKG(s) - NSR  I have reviewed recent and relavant previous record, including: Outpatient notes - 01/14/24 [facility] GI office visit note for PMH.  02/26/24 [facility] ED visit note for HPI.    Social Determinants that significantly affected care: NA         Portions of this record have been created using Scientist, clinical (histocompatibility and immunogenetics). Dictation errors have been sought, but may not have been identified and corrected.    See chart and nursing documentation for additional ED course details.    ____________________________________________         History        Reason for Visit  Abdominal Pain      HPI   Jerome Kennedy is a 48 y.o. male with a PMH of Crohn's disease of small and large intestine, COPD, RUE DVT, left CMC joint arthritis, and bipolar 1 disorder presenting for evaluation of abdominal pain. The patient reports having constant, intense RLQ abdominal pain with associated nausea, 3 episodes of emesis, diminished appetite, and diarrhea since 2 PM today. He describes that one of his episodes of emesis was red-appearing, and does not recall eating anything that could cause this color. Of note, he states this is consistent with previous Crohn's flares.  As noted in chart review below, the patient was seen most recently at the [facility] Main ED on 02/26/24 for similar. His pain resolved after that visit, but returned today. For his pain, he has taken 2 ibuprofen  today without relief. He notes having a ventral hernia for a year. He does not take any steroids or other medications for Crohn's, but states he is trying to get started on Skyrizi. His next GI appointment is in 3 weeks. Upon interview, his last steroid use was around a month or two ago. No recent abdominal trauma. No recent EtOH, tobacco, or other drug use. Denies fever, chills, chest pain, shortness of breath, hematochezia, dysuria, hematuria, or other symptoms.    Per chart review, the patient was seen at the [facility] ED on 02/26/24 for symptoms of nausea, emesis, diarrhea, lack of PO intake, and abdominal pain, all of which he noted were consistent with previous Crohn's flares. His CMP was notable for hypokalemia to 3.2, for which he was administered potassium. It additionally showed low ALT to 8 and elevated Alk Phos to 120. CBC was overall reassuring. Lipase was unremarkable. His CT A/P was notable for acute on chronic perianastomotic Crohn's disease changes. Additionally, the CT showed interval decrease in colonic distention and mild Crohn's proctocolitis without abscess or obstruction. For his pain, he was given Benadryl , Dilaudid , and compazine . Due to the patient tolerating PO, he was discharged with PCP and GI follow up recommendations.     Outside Historian(s)  (EMS, Significant Other, Family, Parent, Caregiver, Friend, Law Enforcement, etc.)    None.    Past Medical History[1]    Past Surgical History[2]    No current facility-administered medications for this encounter.    Current Outpatient Medications:     cariprazine  (VRAYLAR ) 1.5 mg capsule, Take 1 capsule (1.5 mg total) by mouth in the morning., Disp: , Rfl:     cholecalciferol , vitamin D3 25 mcg, 1,000 units,, 1,000 unit (25 mcg) tablet, Take 1 tablet (25 mcg total) by mouth daily., Disp: 30 tablet, Rfl: 0    empty container Misc, Use as directed, Disp: 1 each, Rfl: 1    ferrous sulfate  325 (65 FE) MG tablet, Take 1 tablet (325 mg total) by mouth in the morning., Disp: , Rfl:     FLONASE  ALLERGY RELIEF 50 mcg/actuation nasal spray, 2 sprays into each nostril daily as needed., Disp: , Rfl:     guselkumab  (TREMFYA  PEN INDUCTION PK-CROHN) 200 mg/2 mL PnIj, Inject the contents of 2 pens (400 mg) under the skin every twenty-eight (28) days., Disp: 4 mL, Rfl: 2    guselkumab  (TREMFYA  PEN) 200 mg/2 mL PnIj, Inject the contents of 1 pen (200 mg) under the skin every twenty-eight (28) days., Disp: 2 mL, Rfl: 5    nicotine  (NICODERM CQ ) 21 mg/24 hr patch, Place 1 patch on the skin daily., Disp: 28 patch, Rfl: 0    ondansetron  (ZOFRAN -ODT) 4 MG disintegrating tablet, Dissolve 1 tablet (4 mg total) in the mouth every eight (8) hours as needed for nausea., Disp: 21 tablet, Rfl: 0    OPTICHAMBER DIAMOND VHC Spcr, , Disp: , Rfl:     pantoprazole  (PROTONIX ) 40 MG tablet, Take 1 tablet (40 mg total) by mouth daily., Disp: 90 tablet, Rfl: 0    predniSONE  (DELTASONE ) 10 MG tablet, Take 4 tablets (40 mg total) by mouth daily for 6 days, THEN 3 tablets (30 mg total) daily for 7 days, THEN 2 tablets (20 mg total) daily for 7  days, THEN 1.5 tablets (15 mg total) daily for 7 days, THEN 1 tablet (10 mg total) daily for 7 days, THEN 0.5 tablets (5 mg total) daily for 7 days., Disp: 80 tablet, Rfl: 0    QUEtiapine  (SEROQUEL ) 100 MG tablet, Take 1 tablet (100 mg total) by mouth nightly., Disp: , Rfl:     SYMBICORT  80-4.5 mcg/actuation inhaler, Inhale 2 puffs  in the morning., Disp: 10.2 g, Rfl: 0    VENTOLIN  HFA 90 mcg/actuation inhaler, Inhale 2 puffs every six (6) hours as needed., Disp: 18 g, Rfl: 0    Allergies  Morphine  and Toradol [ketorolac]    Family History[3]    Short Social History[4]       Physical Exam     ED Triage Vitals [03/15/24 1705]   Enc Vitals Group      BP 111/85      Pulse 86      SpO2 Pulse       Resp 16      Temp 36.8 ??C (98.2 ??F)      Temp Source Oral      SpO2 99 %      Weight 68.9 kg (152 lb)      Height      Constitutional: Alert and oriented. Well appearing and in no distress.  Eyes: Conjunctivae are normal.  ENT       Head: Normocephalic and atraumatic.       Nose: No congestion.       Mouth/Throat: Mucous membranes are moist.       Neck: No stridor.  Hematological/Lymphatic/Immunilogical: No cervical lymphadenopathy.  Cardiovascular: Normal rate, regular rhythm. Normal and symmetric distal pulses are present in all extremities.  Respiratory: Normal respiratory effort. Breath sounds are normal.  Gastrointestinal: Soft. There is no CVA tenderness. RLQ tenderness to palpation. Ventral hernia.  Genitourinary: Deferred.  Musculoskeletal: Normal range of motion in all extremities.       Right lower leg: No tenderness or edema.       Left lower leg: No tenderness or edema.  Neurologic: Normal speech and language. No gross focal neurologic deficits are appreciated.  Skin: Skin is warm, dry and intact. No rash noted.  Psychiatric: Mood and affect are normal. Speech and behavior are normal.       Radiology     No orders to display        Procedures including Critical Care     Documentation assistance was provided by Devaughn Hicks, Scribe on March 15, 2024 at 6:48 PM for [employer], DO.    Documentation assistance provided by the above mentioned scribe. I was present during the time the encounter was recorded. The information recorded by the scribe was done at my direction and has been reviewed and validated by me.          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    Myalgia     Rhinovirus infection 10/29/2022    SBO (small bowel obstruction)     2011   [2]   Past Surgical History:  Procedure Laterality Date    CHOLECYSTECTOMY      COLON SURGERY      partial resection    HERNIA REPAIR      PR ARTHRP INTERCARPAL/CARP/MTCRPL JT INTERPOSITION Left 08/07/2022    Procedure: INTERPOSIT ARTHROPLASTY-INTERCARPAL/CARPOMETACAR;  Surgeon: Bonifacio Cordella HERO, MD;  Location: MAIN OR [facility];  Service: Orthopedics PR CLOSE ENTEROSTOMY,RESEC+COLOREC ANAS Midline 11/01/2019    Procedure: CLO ENTEROSTOMY; W/RESECT AUGIE FLAKES;  Surgeon: Aloysius Euell Quale, MD;  Location: MAIN OR [place];  Service: Gastrointestinal    PR COLONOSCOPY FLX DX W/COLLJ SPEC WHEN PFRMD Left 01/07/2013    Procedure: COLONOSCOPY, FLEXIBLE, PROXIMAL TO SPLENIC FLEXURE; DIAGNOSTIC, W/WO COLLECTION SPECIMEN BY BRUSH OR WASH;  Surgeon: Liliane JENEANE Marker, MD;  Location: GI PROCEDURES MEMORIAL [facility];  Service: Gastroenterology    PR COLONOSCOPY FLX DX W/COLLJ SPEC WHEN PFRMD  03/09/2015    Procedure: COLONOSCOPY, FLEXIBLE, PROXIMAL TO SPLENIC FLEXURE; DIAGNOSTIC, W/WO COLLECTION SPECIMEN BY BRUSH OR WASH;  Surgeon: Artist Jayson Leather, MD;  Location: GI PROCEDURES MEMORIAL [facility];  Service: Gastroenterology    PR COLONOSCOPY FLX DX W/COLLJ SPEC WHEN PFRMD N/A 10/28/2019    Procedure: COLONOSCOPY, FLEXIBLE, PROXIMAL TO SPLENIC FLEXURE; DIAGNOSTIC, W/WO COLLECTION SPECIMEN BY BRUSH OR WASH;  Surgeon: Derick Bring, MD;  Location: GI PROCEDURES [facility];  Service: Gastroenterology    PR COLONOSCOPY FLX DX W/COLLJ SPEC WHEN PFRMD N/A 10/01/2022    Procedure: COLONOSCOPY, FLEXIBLE, PROXIMAL TO SPLENIC FLEXURE; DIAGNOSTIC, W/WO COLLECTION SPECIMEN BY BRUSH OR WASH;  Surgeon: Darrick Peck, MD;  Location: GI PROCEDURES [facility];  Service: Gastroenterology    PR COLONOSCOPY FLX DX W/COLLJ SPEC WHEN PFRMD N/A 11/30/2023    Procedure: COLONOSCOPY, FLEXIBLE, PROXIMAL TO SPLENIC FLEXURE; DIAGNOSTIC, W/WO COLLECTION SPECIMEN BY BRUSH OR WASH;  Surgeon: Audrey Lacinda Hun, MD;  Location: GI PROCEDURES [facility];  Service: Gastroenterology    PR EXPLORATORY OF ABDOMEN Midline 09/02/2019    Procedure: EXPLORATORY LAPAROTOMY, EXPLORATORY CELIOTOMY WITH OR WITHOUT BIOPSY(S);  Surgeon: Aloysius Euell Quale, MD;  Location: MAIN OR [facility];  Service: Gastrointestinal    PR EXPLORATORY OF ABDOMEN Midline 11/01/2019    Procedure: EXPLORATORY LAPAROTOMY, EXPLORATORY CELIOTOMY WITH OR WITHOUT BIOPSY(S);  Surgeon: Aloysius Euell Quale, MD;  Location: MAIN OR [facility];  Service: Gastrointestinal    PR FIX FINGER,VOLAR PLATE,I-P JT Left 08/07/2022    Procedure: REPAIR AND RECONSTRUCTION, FINGER, VOLAR PLATE, INTERPHALANGEAL JOINT;  Surgeon: Bonifacio Cordella HERO, MD;  Location: MAIN OR [facility];  Service: Orthopedics    PR FREEING BOWEL ADHESION,ENTEROLYSIS N/A 11/01/2019    Procedure: Enterolysis (Separt Proc);  Surgeon: Aloysius Euell Quale, MD;  Location: MAIN OR [facility];  Service: Gastrointestinal    PR FUSION MC-P JT Left 08/27/2023    Procedure: ARTHRODESIS, METACARPOPHALANGEAL JOINT, WITH OR WITHOUT INTERNAL FIXATION;  Surgeon: Bonifacio Cordella HERO, MD;  Location: OR [facility];  Service: Orthopedics    PR ILEOSCOPY THRU STOMA,BIOPSY N/A 10/14/2019    Procedure: LYNETT; LORN 1/MX;  Surgeon: Thedora Alm Plain, MD;  Location: GI PROCEDURES [facility];  Service: Gastroenterology    PR PART REMOVAL COLON W COLOSTOMY Midline 09/02/2019    Procedure: COLECTOMY, PARTIAL; WITH SKIN LEVEL CECOSTOMY OR COLOSTOMY;  Surgeon: Aloysius Euell Quale, MD;  Location: MAIN OR [place];  Service: Gastrointestinal    PR REPAIR INCISIONAL HERNIA,REDUCIBLE Midline  09/02/2019    Procedure: REPAIR INIT INCISIONAL OR VENTRAL HERNIA; REDUCIBLE;  Surgeon: Arnette Darice Burnet, MD;  Location: MAIN OR [place];  Service: Trauma    PR UPPER GI ENDOSCOPY,BIOPSY N/A 01/07/2013    Procedure: UGI ENDOSCOPY; WITH BIOPSY, SINGLE OR MULTIPLE;  Surgeon: Liliane JENEANE Marker, MD;  Location: GI PROCEDURES MEMORIAL [facility];  Service: Gastroenterology    PR UPPER GI ENDOSCOPY,BIOPSY N/A 10/14/2019    Procedure: UGI ENDOSCOPY; WITH BIOPSY, SINGLE OR MULTIPLE;  Surgeon: Thedora Alm Plain, MD;  Location: GI PROCEDURES [facility];  Service: Gastroenterology    PR UPPER GI ENDOSCOPY,DIAGNOSIS N/A 10/01/2022    Procedure: UGI ENDO, INCLUDE ESOPHAGUS, STOMACH, & DUODENUM &/OR JEJUNUM; DX W/WO COLLECTION SPECIMN, BY BRUSH OR WASH;  Surgeon: Darrick Peck, MD;  Location: GI PROCEDURES [facility];  Service: Gastroenterology   [3]   Family History  Problem Relation Age of Onset    Breast cancer Mother     Diabetes Maternal Aunt     Colon cancer Maternal Grandmother     Prostate cancer Maternal Grandfather     Cancer Paternal Grandmother     Crohn's disease Neg Hx    [4]   Social History  Tobacco Use    Smoking status: Every Day     Current packs/day: 1.00     Average packs/day: 0.6 packs/day for 22.5 years (12.9 ttl pk-yrs)     Types: Cigarettes     Start date: 10/02/2001     Passive exposure: Current    Smokeless tobacco: Never    Tobacco comments:     1ppd   Vaping Use    Vaping status: Some Days    Substances: Nicotine     Passive vaping exposure: Yes   Substance Use Topics    Alcohol use: No     Alcohol/week: 0.0 standard drinks of alcohol    Drug use: Yes     Types: Marijuana     Comment: Smokes 2-3 joints/day        Marti Mitts, DO  Resident  03/16/24 1605

## 2024-03-20 ENCOUNTER — Emergency Department
Admit: 2024-03-20 | Discharge: 2024-03-21 | Disposition: A | Discharge status: Home / Self Care | Payer: Medicaid (Managed Care)

## 2024-03-20 DIAGNOSIS — G8929 Other chronic pain: Principal | ICD-10-CM

## 2024-03-20 DIAGNOSIS — R109 Unspecified abdominal pain: Principal | ICD-10-CM

## 2024-03-20 LAB — CBC W/ AUTO DIFF
BASOPHILS ABSOLUTE COUNT: 0 10*9/L (ref 0.0–0.1)
BASOPHILS RELATIVE PERCENT: 0.6 %
EOSINOPHILS ABSOLUTE COUNT: 0.1 10*9/L (ref 0.0–0.5)
EOSINOPHILS RELATIVE PERCENT: 1.2 %
HEMATOCRIT: 33.4 % — ABNORMAL LOW (ref 39.0–48.0)
HEMOGLOBIN: 11.4 g/dL — ABNORMAL LOW (ref 12.9–16.5)
LYMPHOCYTES ABSOLUTE COUNT: 1.9 10*9/L (ref 1.1–3.6)
LYMPHOCYTES RELATIVE PERCENT: 28.8 %
MEAN CORPUSCULAR HEMOGLOBIN CONC: 34.1 g/dL (ref 32.0–36.0)
MEAN CORPUSCULAR HEMOGLOBIN: 30.1 pg (ref 25.9–32.4)
MEAN CORPUSCULAR VOLUME: 88.2 fL (ref 77.6–95.7)
MEAN PLATELET VOLUME: 7.6 fL (ref 6.8–10.7)
MONOCYTES ABSOLUTE COUNT: 0.8 10*9/L (ref 0.3–0.8)
MONOCYTES RELATIVE PERCENT: 11.6 %
NEUTROPHILS ABSOLUTE COUNT: 3.8 10*9/L (ref 1.8–7.8)
NEUTROPHILS RELATIVE PERCENT: 57.8 %
PLATELET COUNT: 202 10*9/L (ref 150–450)
RED BLOOD CELL COUNT: 3.79 10*12/L — ABNORMAL LOW (ref 4.26–5.60)
RED CELL DISTRIBUTION WIDTH: 16.4 % — ABNORMAL HIGH (ref 12.2–15.2)
WBC ADJUSTED: 6.6 10*9/L (ref 3.6–11.2)

## 2024-03-20 LAB — COMPREHENSIVE METABOLIC PANEL
ALBUMIN: 2.9 g/dL — ABNORMAL LOW (ref 3.4–5.0)
ALKALINE PHOSPHATASE: 107 U/L (ref 46–116)
ALT (SGPT): 15 U/L (ref 10–49)
ANION GAP: 10 mmol/L (ref 5–14)
AST (SGOT): 16 U/L (ref <=34)
BILIRUBIN TOTAL: 0.3 mg/dL (ref 0.3–1.2)
BLOOD UREA NITROGEN: 14 mg/dL (ref 9–23)
BUN / CREAT RATIO: 13
CALCIUM: 8.6 mg/dL — ABNORMAL LOW (ref 8.7–10.4)
CHLORIDE: 114 mmol/L — ABNORMAL HIGH (ref 98–107)
CO2: 22 mmol/L (ref 20.0–31.0)
CREATININE: 1.05 mg/dL (ref 0.73–1.18)
EGFR CKD-EPI (2021) MALE: 88 mL/min/1.73m2 (ref >=60)
GLUCOSE RANDOM: 99 mg/dL (ref 70–179)
POTASSIUM: 4 mmol/L (ref 3.4–4.8)
PROTEIN TOTAL: 6.2 g/dL (ref 5.7–8.2)
SODIUM: 146 mmol/L — ABNORMAL HIGH (ref 135–145)

## 2024-03-20 LAB — SEDIMENTATION RATE: ERYTHROCYTE SEDIMENTATION RATE: 15 mm/h (ref 0–15)

## 2024-03-20 LAB — C-REACTIVE PROTEIN: C-REACTIVE PROTEIN: 11.6 mg/L — ABNORMAL HIGH (ref <=10.0)

## 2024-03-20 LAB — LIPASE: LIPASE: 57 U/L — ABNORMAL HIGH (ref 12–53)

## 2024-03-20 LAB — LACTATE, VENOUS, WHOLE BLOOD: LACTATE BLOOD VENOUS: 1.4 mmol/L (ref 0.5–1.8)

## 2024-03-20 MED ORDER — OXYCODONE 5 MG TABLET
ORAL_TABLET | Freq: Two times a day (BID) | ORAL | 0 refills | 5.00000 days | Status: CP | PRN
Start: 2024-03-20 — End: 2024-03-25
  Filled 2024-03-21: qty 10, 5d supply, fill #0

## 2024-03-20 MED ORDER — ONDANSETRON 4 MG DISINTEGRATING TABLET
ORAL_TABLET | Freq: Three times a day (TID) | ORAL | 0 refills | 7.00000 days | Status: CP | PRN
Start: 2024-03-20 — End: 2024-03-27
  Filled 2024-03-21: qty 21, 7d supply, fill #0

## 2024-03-20 MED ADMIN — HYDROmorphone (PF) (DILAUDID) injection 1 mg: 1 mg | INTRAVENOUS | @ 22:00:00 | Stop: 2024-03-20

## 2024-03-20 MED ADMIN — prochlorperazine (COMPAZINE) injection 5 mg: 5 mg | INTRAVENOUS | @ 22:00:00 | Stop: 2024-03-20

## 2024-03-20 MED ADMIN — sodium chloride 0.9% (NS) bolus 1,000 mL: 1000 mL | INTRAVENOUS | @ 22:00:00 | Stop: 2024-03-20

## 2024-03-20 MED ADMIN — diphenhydrAMINE (BENADRYL) capsule/tablet 50 mg: 50 mg | ORAL | Stop: 2024-03-20

## 2024-03-20 NOTE — Unmapped (Signed)
 Pt BIB [facility] for abdominal pain, bilateral lower quadrant abdominal pain. Hx of Crohns, hospitalized recently on 8/12 for same

## 2024-03-20 NOTE — Unmapped (Signed)
 [facility]  Emergency Department Provider Note        ED Clinical Impression      Final diagnoses:   Chronic abdominal pain (Primary)           Impression, ED Course, Assessment and Plan      Impression: Jerome Dehner. is a 48 y.o. 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  VSS in triage, nontoxic in appearance.    Ddx includes chronic etiology vs crohns flare vs less likely diverticulitis, intraabdominal abscess, SBO, appendicitis.  As his hernia is easy reducible minimal suspicion for incarcerated hernia.  Will obtain basic labs including ESR/CRP, lipase, lactate, UA.  As he has had 4 CT scans in the last 2 months with similar reported symptoms have deferred imaging to expose additional radiation exposure if at all possible due to risks and will reassess.    8:53 PM  Labs without any notable leukocytosis, H/H stable and similar to previous.  Lactate normal.  Lipase trivially elevated at 57, inflammatory markers unremarkable.      9:03 PM  Upon reassessment, patient feels improved after symptomatic control, has tolerated po without any emesis since arrival.  Repeat abdominal exam improved from previous with minimal discomfort.  After shared discussion, have deferred additional imaging today.  Plan to discharge with a short course of oxycodone  and Zofran  that he can take as needed.  Recommended bland diet, hydration.  PCP follow-up as needed for ongoing symptoms.  Strict reevaluation criteria were provided and patient verbalized understanding agreeable to discharge.       Additional Medical Decision Making     I have reviewed the vital signs and the nursing notes. Labs and radiology results that were available during my care of the patient were independently reviewed by me and considered in my medical decision making.     I reviewed the patient's prior medical records (meds, hx).     Portions of this record have been created using Scientist, clinical (histocompatibility and immunogenetics). Dictation errors have been sought, but may not have been identified and corrected.  ____________________________________________         History        Chief Complaint  Emesis      HPI   Jerome Nichelson. is a 48 y.o. 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  Onset of symptoms reportedly 2 days ago, seen in the ER for same 8/12, eloped from the department.  States his pain is diffusely throughout the abdomen however worse greatest at the right lower quadrant.  He was seen for this 7/25 as well with CT imaging of acute on chronic changes of perianastomotic Crohn's disease, no evidence of obstruction, abscess.  Mild proctocolitis.  He is reporting that he has had subjective fevers, vomiting and diarrhea.  No hematochezia or melena, hematemesis, urinary symptoms.  No recent dietary changes.  Eating something when I first walk in the room.      Past Medical History[1]    Problem List[2]    Past Surgical History[3]    No current facility-administered medications for this encounter.    Current Outpatient Medications:     cariprazine  (VRAYLAR ) 1.5 mg capsule, Take 1 capsule (1.5 mg total) by mouth in the morning., Disp: , Rfl:     cholecalciferol , vitamin  D3 25 mcg, 1,000 units,, 1,000 unit (25 mcg) tablet, Take 1 tablet (25 mcg total) by mouth daily., Disp: 30 tablet, Rfl: 0    empty container Misc, Use as directed, Disp: 1 each, Rfl: 1    ferrous sulfate  325 (65 FE) MG tablet, Take 1 tablet (325 mg total) by mouth in the morning., Disp: , Rfl:     FLONASE  ALLERGY RELIEF 50 mcg/actuation nasal spray, 2 sprays into each nostril daily as needed., Disp: , Rfl:     guselkumab  (TREMFYA  PEN INDUCTION PK-CROHN) 200 mg/2 mL PnIj, Inject the contents of 2 pens (400 mg) under the skin every twenty-eight (28) days., Disp: 4 mL, Rfl: 2    guselkumab  (TREMFYA  PEN) 200 mg/2 mL PnIj, Inject the contents of 1 pen (200 mg) under the skin every twenty-eight (28) days., Disp: 2 mL, Rfl: 5    nicotine  (NICODERM CQ ) 21 mg/24 hr patch, Place 1 patch on the skin daily., Disp: 28 patch, Rfl: 0    ondansetron  (ZOFRAN -ODT) 4 MG disintegrating tablet, Take 1 tablet (4 mg total) by mouth every eight (8) hours as needed for nausea for up to 7 days., Disp: 21 tablet, Rfl: 0    OPTICHAMBER DIAMOND VHC Spcr, , Disp: , Rfl:     oxyCODONE  (ROXICODONE ) 5 MG immediate release tablet, Take 1 tablet (5 mg total) by mouth two (2) times a day as needed for pain for up to 5 days., Disp: 10 tablet, Rfl: 0    pantoprazole  (PROTONIX ) 40 MG tablet, Take 1 tablet (40 mg total) by mouth daily., Disp: 90 tablet, Rfl: 0    predniSONE  (DELTASONE ) 10 MG tablet, Take 4 tablets (40 mg total) by mouth daily for 6 days, THEN 3 tablets (30 mg total) daily for 7 days, THEN 2 tablets (20 mg total) daily for 7 days, THEN 1.5 tablets (15 mg total) daily for 7 days, THEN 1 tablet (10 mg total) daily for 7 days, THEN 0.5 tablets (5 mg total) daily for 7 days., Disp: 80 tablet, Rfl: 0    QUEtiapine  (SEROQUEL ) 100 MG tablet, Take 1 tablet (100 mg total) by mouth nightly., Disp: , Rfl:     SYMBICORT  80-4.5 mcg/actuation inhaler, Inhale 2 puffs  in the morning., Disp: 10.2 g, Rfl: 0    VENTOLIN  HFA 90 mcg/actuation inhaler, Inhale 2 puffs every six (6) hours as needed., Disp: 18 g, Rfl: 0    Allergies  Morphine  and Toradol [ketorolac]    Family History[4]    Social History  Short Social History[5]       Physical Exam     This provider entered the patient's room: Yes:    If this provider did not enter the room, a comprehensive physical exam was not able to be performed due to increased infection risk to themselves, other providers, staff and other patients), as well as to conserve personal protective equipment (PPE) utilization during the COVID-19 pandemic.    If this provider did enter the patient room, the following was PPE worn: Surgical mask, eye protection and gloves    ED Triage Vitals   Enc Vitals Group      BP 03/20/24 1638 113/76      Pulse 03/20/24 1634 97      SpO2 Pulse --       Resp 03/20/24 1639 20      Temp 03/20/24 1638 36.4 ??C (97.6 ??F)      Temp Source 03/20/24 1638 Oral      SpO2 03/20/24  1634 98 %     Constitutional: Alert and oriented. Well appearing and in no distress.  Eyes: Conjunctivae are normal.  Cardiovascular: Normal rate, regular rhythm. No murmurs, rubs or gallops.  Respiratory: Normal respiratory effort. Breath sounds are normal in all lobes.  Gastrointestinal: Soft and nondistended. Audible bowel sounds in all 4 quadrants.  TTP diffusely however greatest at the right lower quadrant, no rebound or guarding.  He does have a large inferior mid abdominal hernia which is soft, easily reducible.  Neurologic: Normal speech and language. No gross focal neurologic deficits are appreciated.  GCS 15.  Skin: Skin is warm, dry and intact. No rash noted.  Psychiatric: Mood and affect are normal. Speech and behavior are normal.     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, left    Hypernatremia   [3]   Past Surgical History:  Procedure Laterality Date    CHOLECYSTECTOMY      COLON SURGERY      partial resection    HERNIA REPAIR      PR ARTHRP INTERCARPAL/CARP/MTCRPL JT INTERPOSITION Left 08/07/2022    Procedure: INTERPOSIT ARTHROPLASTY-INTERCARPAL/CARPOMETACAR;  Surgeon: Bonifacio Cordella HERO, MD;  Location: MAIN OR [place];  Service: Orthopedics    PR CLOSE ENTEROSTOMY,RESEC+COLOREC ANAS Midline 11/01/2019    Procedure: CLO ENTEROSTOMY; W/RESECT AUGIE FLAKES;  Surgeon: Aloysius Euell Quale, MD;  Location: MAIN OR [place];  Service: Gastrointestinal    PR COLONOSCOPY FLX DX W/COLLJ SPEC WHEN PFRMD Left 01/07/2013    Procedure: COLONOSCOPY, FLEXIBLE, PROXIMAL TO SPLENIC FLEXURE; DIAGNOSTIC, W/WO COLLECTION SPECIMEN BY BRUSH OR WASH;  Surgeon: Liliane JENEANE Marker,  MD;  Location: GI PROCEDURES MEMORIAL [place];  Service: Gastroenterology    PR COLONOSCOPY FLX DX W/COLLJ SPEC WHEN PFRMD  03/09/2015    Procedure: COLONOSCOPY, FLEXIBLE, PROXIMAL TO SPLENIC FLEXURE; DIAGNOSTIC, W/WO COLLECTION SPECIMEN BY BRUSH OR WASH;  Surgeon: Artist Jayson Leather, MD;  Location: GI PROCEDURES [facility];  Service: Gastroenterology    PR COLONOSCOPY FLX DX W/COLLJ SPEC WHEN PFRMD N/A 10/28/2019    Procedure: COLONOSCOPY, FLEXIBLE, PROXIMAL TO SPLENIC FLEXURE; DIAGNOSTIC, W/WO COLLECTION SPECIMEN BY BRUSH OR WASH;  Surgeon: Derick Bring, MD;  Location: [facility] PROC[facility]URES [facility];  Service: Gastroenterology    PR COLONOSCOPY FLX DX W/COLLJ SPEC WHEN PFRMD N/A 10/01/2022    Procedure: COLONOSCOPY, FLEXIBLE, PROXIMAL TO SPLENIC FLEXURE; DIAGNOSTIC, W/WO COLLECTION SPECIMEN BY BRUSH OR WASH;  Surgeon: Darrick Peck, MD;  Location: GI PROC[facility]URES [facility];  Service: Gastroenterology    PR COLONOSCOPY FLX DX W/COLLJ SPEC WHEN PFRMD N/A 11/30/2023    Procedure: COLONOSCOPY, FLEXIBLE, PROXIMAL TO SPLENIC FLEXURE; DIAGNOSTIC, W/WO COLLECTION SPECIMEN BY BRUSH OR WASH;  Surgeon: Audrey Lacinda Hun, MD;  Location: GI PROCEDURES [facility];  Service: Gastroenterology    PR EXPLORATORY OF ABDOMEN Midline 09/02/2019    Procedure: EXPLORATORY LAPAROTOMY, EXPLORATORY CELIOTOMY WITH OR WITHOUT BIOPSY(S);  Surgeon: Aloysius Euell Quale, MD;  Location: MAIN OR [facility];  Service: Gastrointestinal    PR EXPLORATORY OF ABDOMEN Midline 11/01/2019    Procedure: EXPLORATORY LAPAROTOMY, EXPLORATORY CELIOTOMY WITH OR WITHOUT BIOPSY(S);  Surgeon: Aloysius Euell Quale, MD;  Location: MAIN OR [facility];  Service: Gastrointestinal    PR FIX FINGER,VOLAR PLATE,I-P JT Left 08/07/2022    Procedure: REPAIR AND RECONSTRUCTION, FINGER, VOLAR PLATE, INTERPHALANGEAL JOINT;  Surgeon: Bonifacio Cordella HERO, MD;  Location: MAIN OR [facility];  Service: Orthopedics    PR FREEING BOWEL ADHESION,ENTEROLYSIS N/A 11/01/2019    Procedure: Enterolysis (Separt Proc);  Surgeon: Aloysius Euell Quale, MD;  Location: MAIN OR [facility];  Service: Gastrointestinal    PR FUSION MC-P JT Left 08/27/2023    Procedure: ARTHRODESIS, METACARPOPHALANGEAL JOINT, WITH OR WITHOUT INTERNAL FIXATION;  Surgeon: Bonifacio Cordella HERO, MD;  Location: OR [facility];  Service: Orthopedics    PR ILEOSCOPY THRU STOMA,BIOPSY N/A 10/14/2019    Procedure: LYNETT; LORN 1/MX;  Surgeon: Thedora Alm Plain, MD;  Location: GI PROCEDURES [facility]IAL [facility];  Service: Gastroenterology    PR PART REMOVAL COLON W COLOSTOMY Midline 09/02/2019    Procedure: COLECTOMY, PARTIAL; WITH SKIN LEVEL CECOSTOMY OR COLOSTOMY;  Surgeon: Aloysius Euell Quale, MD;  Location: MAIN OR [facility];  Service: Gastrointestinal    PR REPAIR INCISIONAL HERNIA,REDUCIBLE Midline 09/02/2019    Procedure: REPAIR INIT INCISIONAL OR VENTRAL HERNIA; REDUCIBLE;  Surgeon: Arnette Darice Burnet, MD;  Location: MAIN OR [place];  Service: Trauma    PR UPPER GI ENDOSCOPY,BIOPSY N/A 01/07/2013    Procedure: UGI ENDOSCOPY; WITH BIOPSY, SINGLE OR MULTIPLE;  Surgeon: Liliane JENEANE Marker, MD;  Location: GI PROCEDURES [facility];  Service: Gastroenterology    PR UPPER GI ENDOSCOPY,BIOPSY N/A 10/14/2019    Procedure: UGI ENDOSCOPY; WITH BIOPSY, SINGLE OR MULTIPLE;  Surgeon: Thedora Alm Plain, MD;  Location: GI PROCEDURES [facility];  Service: Gastroenterology    PR UPPER GI ENDOSCOPY,DIAGNOSIS N/A 10/01/2022    Procedure: UGI ENDO, INCLUDE ESOPHAGUS, STOMACH, & DUODENUM &/OR JEJUNUM; DX W/WO COLLECTION SPECIMN, BY BRUSH OR WASH;  Surgeon: Darrick Peck, MD;  Location: GI PROCEDURES [facility];  Service: Gastroenterology   [4]   Family History  Problem Relation Age of Onset    Breast cancer Mother  Diabetes Maternal Aunt     Colon cancer Maternal Grandmother     Prostate cancer Maternal Grandfather     Cancer Paternal Grandmother     Crohn's disease Neg Hx    [5]   Social History  Tobacco Use    Smoking status: Every Day     Current packs/day: 1.00     Average packs/day: 0.6 packs/day for 22.5 years (12.9 ttl pk-yrs)     Types: Cigarettes     Start date: 10/02/2001     Passive exposure: Current    Smokeless tobacco: Never    Tobacco comments:     1ppd   Vaping Use    Vaping status: Some Days    Substances: Nicotine     Passive vaping exposure: Yes   Substance Use Topics    Alcohol use: No     Alcohol/week: 0.0 standard drinks of alcohol    Drug use: Yes     Types: Marijuana     Comment: Smokes 2-3 joints/day        Gearline Woodroe Browning, [facility]  03/20/24 2107

## 2024-03-20 NOTE — Unmapped (Signed)
 Pt BIB [facility] for vomiting with some bloody streaks, abd pain, onset last night. VSS

## 2024-03-21 MED ADMIN — HYDROmorphone (PF) (DILAUDID) injection 1 mg: 1 mg | INTRAVENOUS | Stop: 2024-03-20

## 2024-03-21 MED ADMIN — oxyCODONE (ROXICODONE) immediate release tablet 5 mg: 5 mg | ORAL | @ 02:00:00 | Stop: 2024-03-20

## 2024-03-23 MED ORDER — PANTOPRAZOLE 40 MG TABLET,DELAYED RELEASE
ORAL_TABLET | Freq: Every day | ORAL | 0 refills | 90.00000 days | Status: CN
Start: 2024-03-23 — End: 2024-06-21

## 2024-03-30 NOTE — Unmapped (Signed)
 [facility] Clinical Assessment & Refill Coordination Note    Jerome Keadle., DOB: October 24, 1975  Phone: [phone] (home)     All above HIPAA information was verified with patient.     Was a Nurse, learning disability used for this call? No    Specialty Medication(s):   Inflammatory Disorders: Tremfya      Current Medications[1]     Changes to medications: Kermit reports no changes at this time.    Medication list has been reviewed and updated in Epic: Yes    Allergies[2]    Changes to allergies: No    Allergies have been reviewed and updated in Epic: Yes    SPECIALTY MEDICATION ADHERENCE     Tremfya  200 mg/13ml: 0 doses of medicine on hand       Medication Adherence    Patient reported X missed doses in the last month: 0  Specialty Medication: Tremfya           Specialty medication(s) dose(s) confirmed: Regimen is correct and unchanged.     Are there any concerns with adherence? No    Adherence counseling provided? Not needed    CLINICAL MANAGEMENT AND INTERVENTION      Clinical Benefit Assessment:    Do you feel the medicine is effective or helping your condition? Yes    Clinical Benefit counseling provided? Not needed    Adverse Effects Assessment:    Are you experiencing any side effects? No    Are you experiencing difficulty administering your medicine? No    Quality of Life Assessment:    Quality of Life    Rheumatology  Oncology  Dermatology  Cystic Fibrosis          How many days over the past month did your Crohn's disease  keep you from your normal activities? For example, brushing your teeth or getting up in the morning. 0    Have you discussed this with your provider? Not needed    Acute Infection Status:    Acute infections noted within Epic:  No active infections    Patient reported infection: None    Therapy Appropriateness:    Is therapy appropriate based on current medication list, adverse reactions, adherence, clinical benefit and progress toward achieving therapeutic goals? Yes, therapy is appropriate and should be continued     Clinical Intervention:    Was an intervention completed as part of this clinical assessment? No    DISEASE/MEDICATION-SPECIFIC INFORMATION      For patients on injectable medications: Next injection is scheduled for 9/3.    Chronic Inflammatory Diseases: Have you experienced any flares in the last month? No  Has this been reported to your provider? No    PATIENT SPECIFIC NEEDS     Does the patient have any physical, cognitive, or cultural barriers? No    Is the patient high risk? No    Does the patient require physician intervention or other additional services (i.e., nutrition, smoking cessation, social work)? No    Does the patient have an additional or emergency contact listed in their chart? Yes    SOCIAL DETERMINANTS OF HEALTH     At the [facility], we have learned that life circumstances - like trouble affording food, housing, utilities, or transportation can affect the health of many of our patients.   That is why we wanted to ask: are you currently experiencing any life circumstances that are negatively impacting your health and/or quality of life? Patient declined to answer  Social Drivers of Health     Food Insecurity: No Food Insecurity (12/29/2023)    Hunger Vital Sign     Worried About Running Out of Food in the Last Year: Never true     Ran Out of Food in the Last Year: Never true   Tobacco Use: High Risk (03/20/2024)    Patient History     Smoking Tobacco Use: Every Day     Smokeless Tobacco Use: Never     Passive Exposure: Current   Transportation Needs: No Transportation Needs (12/29/2023)    PRAPARE - Transportation     Lack of Transportation (Medical): No     Lack of Transportation (Non-Medical): No   Alcohol Use: Not on file   Housing: Low Risk  (12/29/2023)    Housing     Within the past 12 months, have you ever stayed: outside, in a car, in a tent, in an overnight shelter, or temporarily in someone else's home (i.e. couch-surfing)?: No Are you worried about losing your housing?: No   Physical Activity: Not on File (06/21/2021)    Received from [facility]    Physical Activity     Physical Activity: 0   Utilities: Low Risk  (12/29/2023)    Utilities     Within the past 12 months, have you been unable to get utilities (heat, electricity) when it was really needed?: No   Stress: Not on File (06/21/2021)    Received from [facility]    Stress     Stress: 0   Interpersonal Safety: Not At Risk (03/20/2024)    Interpersonal Safety     Unsafe Where You Currently Live: No     Physically Hurt by Anyone: No     Abused by Anyone: No   Substance Use: Not on file (06/10/2023)   Intimate Partner Violence: Not At Risk (02/26/2024)    Humiliation, Afraid, Rape, and Kick questionnaire     Fear of Current or Ex-Partner: No     Emotionally Abused: No     Physically Abused: No     Sexually Abused: No   Social Connections: Not on File (04/18/2023)    Received from [employer] [facility]    Social Connections     Connectedness: 0   Financial Resource Strain: Low Risk  (12/29/2023)    Overall Financial Resource Strain (CARDIA)     Difficulty of Paying Living Expenses: Not hard at all   Health Literacy: Not on file   Internet Connectivity: Not on file       Would you be willing to receive help with any of the needs that you have identified today? Not applicable       SHIPPING     Specialty Medication(s) to be Shipped:   Inflammatory Disorders: Tremfya     Other medication(s) to be shipped: No additional medications requested for fill at this time    Specialty Medications not needed at this time: N/A     Changes to insurance: No    Cost and Payment: Patient has a copay of $4. They are aware and have authorized the pharmacy to charge the credit card on file.    Delivery Scheduled: Yes, Expected medication delivery date: 9/2.     Medication will be delivered via Same Day Courier to the confirmed prescription address in [facility].    The patient will receive a drug information handout for each medication shipped and additional FDA Medication Guides as required.  Verified that patient has previously received a Conservation officer, historic buildings and  a Surveyor, mining.    The patient or caregiver noted above participated in the development of this care plan and knows that they can request review of or adjustments to the care plan at any time.      All of the patient's questions and concerns have been addressed.    Jerome Kennedy, PharmD   [facility] Specialty Pharmacist       [1]   Current Outpatient Medications   Medication Sig Dispense Refill    cariprazine  (VRAYLAR ) 1.5 mg capsule Take 1 capsule (1.5 mg total) by mouth in the morning.      cholecalciferol , vitamin D3 25 mcg, 1,000 units,, 1,000 unit (25 mcg) tablet Take 1 tablet (25 mcg total) by mouth daily. 30 tablet 0    empty container Misc Use as directed 1 each 1    ferrous sulfate  325 (65 FE) MG tablet Take 1 tablet (325 mg total) by mouth in the morning.      FLONASE  ALLERGY RELIEF 50 mcg/actuation nasal spray 2 sprays into each nostril daily as needed.      guselkumab  (TREMFYA  PEN INDUCTION PK-CROHN) 200 mg/2 mL PnIj Inject the contents of 2 pens (400 mg) under the skin every twenty-eight (28) days. 4 mL 2    guselkumab  (TREMFYA  PEN) 200 mg/2 mL PnIj Inject the contents of 1 pen (200 mg) under the skin every twenty-eight (28) days. 2 mL 5    nicotine  (NICODERM CQ ) 21 mg/24 hr patch Place 1 patch on the skin daily. 28 patch 0    OPTICHAMBER DIAMOND VHC Spcr       pantoprazole  (PROTONIX ) 40 MG tablet Take 1 tablet (40 mg total) by mouth daily. 90 tablet 0    QUEtiapine  (SEROQUEL ) 100 MG tablet Take 1 tablet (100 mg total) by mouth nightly.      SYMBICORT  80-4.5 mcg/actuation inhaler Inhale 2 puffs  in the morning. 10.2 g 0    VENTOLIN  HFA 90 mcg/actuation inhaler Inhale 2 puffs every six (6) hours as needed. 18 g 0     No current facility-administered medications for this visit.   [2]   Allergies  Allergen Reactions    Morphine  Itching and Rash He gets light headed    Toradol [Ketorolac] Itching

## 2024-04-05 MED FILL — TREMFYA PEN 200 MG/2 ML SUBCUTANEOUS PEN INJECTOR: SUBCUTANEOUS | 28 days supply | Qty: 2 | Fill #0

## 2024-04-06 NOTE — Unmapped (Signed)
 BIB [facility] from home for sharp abd pain. Known hernia in RLQ, pt rpeorts pain started yesterday. C/o emesis    BGL 119, hx of Crohn's.

## 2024-04-06 NOTE — Unmapped (Signed)
 BIB [facility] abdominal pain. Reports hernia. HRD84. VSS.

## 2024-04-07 ENCOUNTER — Ambulatory Visit: Admit: 2024-04-07 | Discharge: 2024-04-08 | Payer: Medicaid (Managed Care)

## 2024-04-07 ENCOUNTER — Encounter
Admit: 2024-04-07 | Discharge: 2024-04-08 | Payer: Medicaid (Managed Care) | Attending: Anesthesiology | Primary: Anesthesiology

## 2024-04-07 ENCOUNTER — Inpatient Hospital Stay: Admit: 2024-04-07 | Discharge: 2024-04-08 | Payer: Medicaid (Managed Care)

## 2024-04-07 LAB — CBC W/ AUTO DIFF
BASOPHILS ABSOLUTE COUNT: 0.1 10*9/L (ref 0.0–0.1)
BASOPHILS RELATIVE PERCENT: 0.8 %
EOSINOPHILS ABSOLUTE COUNT: 0.2 10*9/L (ref 0.0–0.5)
EOSINOPHILS RELATIVE PERCENT: 2.3 %
HEMATOCRIT: 38.1 % — ABNORMAL LOW (ref 39.0–48.0)
HEMOGLOBIN: 12.6 g/dL — ABNORMAL LOW (ref 12.9–16.5)
LYMPHOCYTES ABSOLUTE COUNT: 2.3 10*9/L (ref 1.1–3.6)
LYMPHOCYTES RELATIVE PERCENT: 23.4 %
MEAN CORPUSCULAR HEMOGLOBIN CONC: 33 g/dL (ref 32.0–36.0)
MEAN CORPUSCULAR HEMOGLOBIN: 29.7 pg (ref 25.9–32.4)
MEAN CORPUSCULAR VOLUME: 90 fL (ref 77.6–95.7)
MEAN PLATELET VOLUME: 8.7 fL (ref 6.8–10.7)
MONOCYTES ABSOLUTE COUNT: 0.8 10*9/L (ref 0.3–0.8)
MONOCYTES RELATIVE PERCENT: 8.3 %
NEUTROPHILS ABSOLUTE COUNT: 6.3 10*9/L (ref 1.8–7.8)
NEUTROPHILS RELATIVE PERCENT: 65.2 %
PLATELET COUNT: 222 10*9/L (ref 150–450)
RED BLOOD CELL COUNT: 4.23 10*12/L — ABNORMAL LOW (ref 4.26–5.60)
RED CELL DISTRIBUTION WIDTH: 14.6 % (ref 12.2–15.2)
WBC ADJUSTED: 9.6 10*9/L (ref 3.6–11.2)

## 2024-04-07 LAB — COMPREHENSIVE METABOLIC PANEL
ALBUMIN: 3 g/dL — ABNORMAL LOW (ref 3.4–5.0)
ALKALINE PHOSPHATASE: 120 U/L — ABNORMAL HIGH (ref 46–116)
ALT (SGPT): 10 U/L (ref 10–49)
ANION GAP: 11 mmol/L (ref 5–14)
AST (SGOT): 17 U/L (ref <=34)
BILIRUBIN TOTAL: 0.3 mg/dL (ref 0.3–1.2)
BLOOD UREA NITROGEN: 8 mg/dL — ABNORMAL LOW (ref 9–23)
BUN / CREAT RATIO: 8
CALCIUM: 8.3 mg/dL — ABNORMAL LOW (ref 8.7–10.4)
CHLORIDE: 108 mmol/L — ABNORMAL HIGH (ref 98–107)
CO2: 24 mmol/L (ref 20.0–31.0)
CREATININE: 0.98 mg/dL (ref 0.73–1.18)
EGFR CKD-EPI (2021) MALE: >90 mL/min/1.73m2 (ref >=60)
GLUCOSE RANDOM: 98 mg/dL (ref 70–179)
POTASSIUM: 3.6 mmol/L (ref 3.4–4.8)
PROTEIN TOTAL: 6.6 g/dL (ref 5.7–8.2)
SODIUM: 143 mmol/L (ref 135–145)

## 2024-04-07 LAB — URINALYSIS WITH MICROSCOPY WITH CULTURE REFLEX PERFORMABLE
BACTERIA: NONE SEEN /HPF
BILIRUBIN UA: NEGATIVE
BLOOD UA: NEGATIVE
KETONES UA: NEGATIVE
LEUKOCYTE ESTERASE UA: NEGATIVE
NITRITE UA: NEGATIVE
PH UA: 6.5 (ref 5.0–9.0)
PROTEIN UA: NEGATIVE
RBC UA: 1 /HPF (ref <=3)
SPECIFIC GRAVITY UA: 1.019 (ref 1.003–1.030)
SQUAMOUS EPITHELIAL: 4 /HPF (ref 0–5)
UROBILINOGEN UA: <2
WBC UA: 1 /HPF (ref <=2)

## 2024-04-07 LAB — LACTATE, VENOUS, WHOLE BLOOD: LACTATE BLOOD VENOUS: 0.9 mmol/L (ref 0.5–1.8)

## 2024-04-07 LAB — C-REACTIVE PROTEIN: C-REACTIVE PROTEIN: 17.8 mg/L — ABNORMAL HIGH (ref <=10.0)

## 2024-04-07 LAB — SLIDE REVIEW

## 2024-04-07 LAB — LIPASE: LIPASE: 39 U/L (ref 12–53)

## 2024-04-07 LAB — SEDIMENTATION RATE: ERYTHROCYTE SEDIMENTATION RATE: 17 mm/h — ABNORMAL HIGH (ref 0–15)

## 2024-04-07 MED ADMIN — prochlorperazine (COMPAZINE) injection 10 mg: 10 mg | INTRAVENOUS | @ 09:00:00 | Stop: 2024-04-07

## 2024-04-07 MED ADMIN — diphenhydrAMINE (BENADRYL) capsule/tablet 25 mg: 25 mg | ORAL | @ 09:00:00 | Stop: 2024-04-07

## 2024-04-07 MED ADMIN — methylPREDNISolone sodium succinate (SOLU-Medrol) injection 20 mg: 20 mg | INTRAVENOUS | @ 20:00:00

## 2024-04-07 MED ADMIN — acetaminophen (TYLENOL) tablet 1,000 mg: 1000 mg | ORAL | @ 22:00:00

## 2024-04-07 MED ADMIN — ondansetron (ZOFRAN) injection 4 mg: 4 mg | INTRAVENOUS | @ 14:00:00 | Stop: 2024-04-07

## 2024-04-07 MED ADMIN — morphine 4 mg/mL injection 4 mg: 4 mg | INTRAVENOUS | @ 11:00:00 | Stop: 2024-04-07

## 2024-04-07 MED ADMIN — nicotine (NICODERM CQ) 14 mg/24 hr patch 1 patch: 1 | TRANSDERMAL | @ 21:00:00

## 2024-04-07 MED ADMIN — ondansetron (ZOFRAN) tablet 4 mg: 4 mg | ORAL | @ 20:00:00 | Stop: 2024-04-07

## 2024-04-07 MED ADMIN — prochlorperazine (COMPAZINE) injection 5 mg: 5 mg | INTRAVENOUS | @ 17:00:00 | Stop: 2024-04-07

## 2024-04-07 MED ADMIN — iohexol (OMNIPAQUE) 350 mg iodine/mL solution 100 mL: 100 mL | INTRAVENOUS | @ 12:00:00 | Stop: 2024-04-07

## 2024-04-07 MED ADMIN — morphine 4 mg/mL injection 4 mg: 4 mg | INTRAVENOUS | @ 09:00:00 | Stop: 2024-04-07

## 2024-04-07 MED ADMIN — lactated ringers bolus 1,000 mL: 1000 mL | INTRAVENOUS | @ 20:00:00 | Stop: 2024-04-07

## 2024-04-07 MED ADMIN — morphine 4 mg/mL injection 4 mg: 4 mg | INTRAVENOUS | @ 17:00:00 | Stop: 2024-04-07

## 2024-04-07 MED ADMIN — morphine 4 mg/mL injection 4 mg: 4 mg | INTRAVENOUS | @ 14:00:00 | Stop: 2024-04-07

## 2024-04-07 NOTE — Unmapped (Signed)
 I received signout from previous ED physician.     ED I-PASS Handoff  Patient Summary: Jerome Kennedy. is a 48 y.o. male past medical history of Crohn's disease and multiple hernias here for abdominal pain  Action List: Patient is pending CT abdomen pelvis. Dispo is pending the results of these tests.   Situation Awareness (Contingency Planning): Anticipate dispo per results of CT abdomen pelvis  Synthesis by Receiver    ED Course as of 04/07/24 1433   Thu Apr 07, 2024   9161 CT Abdomen Pelvis W IV Contrast Only  Patient was CT concerning for acute Crohn's flare.  Will page out for admission   0848 Discussed with GI who recommend additional lab studies and admission.  They do not recommend acute treatment invention at this time.   9141 MAO paged   1432 Patient signed out to oncoming team pending admission.       Discussion of Management with other Physicians, QHP, or Appropriate Source:   External Records Reviewed: I have independently reviewed patient's external medical records through Care Everywhere:   Escalation of Care, Consideration of Admission/Observation/Transfer:   Social determinants that significantly affected care:   Prescription drug(s) considered but not prescribed:   Diagnostic tests considered but not performed:     Final diagnoses:   None       Vitals:    04/06/24 2210 04/06/24 2225 04/07/24 0601 04/07/24 1006   BP: 106/73  123/77 103/71   Pulse: 92 92 59 83   Resp: 16 16 20     Temp: 37.8 ??C (100.1 ??F)  36.8 ??C (98.2 ??F) 36.7 ??C (98.1 ??F)   TempSrc: Oral  Oral Oral   SpO2: 100% 95% 95% 99%       CT Abdomen Pelvis W IV Contrast Only   Final Result   - Compared to CT dated 02/26/2024, there is increased wall distention and wall thickening of the rectum and distal sigmoid colon with upstream dilatation of the large bowel and distal ileum. This may related to a Crohn's flare or infectious/inflammatory colitis.   - Similar wall thickening of the distal ileum just proximal to the ileocolic anastomosis, compatible with perianastomotic Crohn's disease.             Lab Results   Component Value Date    WBC 9.6 04/07/2024    HGB 12.6 (L) 04/07/2024    HCT 38.1 (L) 04/07/2024    PLT 222 04/07/2024       Lab Results   Component Value Date    NA 143 04/07/2024    K 3.6 04/07/2024    CL 108 (H) 04/07/2024    CO2 24.0 04/07/2024    BUN 8 (L) 04/07/2024    CREATININE 0.98 04/07/2024    GLU 98 04/07/2024    CALCIUM  8.3 (L) 04/07/2024    MG 1.9 02/08/2024    PHOS 2.2 (L) 12/02/2023       Lab Results   Component Value Date    BILITOT 0.3 04/07/2024    BILIDIR 0.20 04/26/2023    PROT 6.6 04/07/2024    ALBUMIN 3.0 (L) 04/07/2024    ALT 10 04/07/2024    AST 17 04/07/2024    ALKPHOS 120 (H) 04/07/2024    GGT 39 02/03/2024       Lab Results   Component Value Date    LABPROT 12.0 01/08/2013    INR 0.99 10/18/2023    APTT 22.4 (L) 10/18/2023

## 2024-04-07 NOTE — Unmapped (Addendum)
 [facility]  Emergency Department Provider Note      ED Clinical Impression       Diagnosis ICD-10-CM Associated Orders   1. Crohn's disease with complication, unspecified gastrointestinal tract location    (CMS-HCC)  K50.919 Case Request GI: COLONOSCOPY, FLEX, PROX SPLEN FLEX; DX, W/WO SPEC(S) BRUSH/WASH W/WO: DECOMPRESS W/ENDO U/S     Case Request GI: COLONOSCOPY, FLEX, PROX SPLEN FLEX; DX, W/WO SPEC(S) BRUSH/WASH W/WO: DECOMPRESS W/ENDO U/S     Surgical pathology exam     Surgical pathology exam      2. Abdominal pain, unspecified abdominal location  R10.9                Impression, Medical Decision Making, Progress Notes and Critical Care      Impression, Differential Diagnosis and Plan of Care    Patient is a 48 year old male with significant past medical history of Crohn's disease presenting to the emergency department for abdominal pain.      Multiple surgical scar hernias appreciated in the abdomen which are reducible.    Differential diagnosis considered, but are not limited to: Bowel obstruction, Crohn's exacerbation, fistula, abscess.    Will treat the pain and nausea and order CT of the abdomen and pelvis.    Patient requested Dilaudid , when informed that he was not getting Dilaudid  he requested morphine  and stated he is not allergic he just gets itchy.  P.o. Benadryl  will be ordered along with the morphine .      ED Course as of 04/08/24 1259   Thu Apr 07, 2024   0632 Patient's care will be transferred to incoming emergency physician pending CT of the abdomen and pelvis.  In summary patient has a history of Crohn's with significant abdominal wall hernias along surgical scars.  Concern for possible Crohn's complications, hernia incarcerations.       Additional MDM Elements                          Portions of this record have been created using Dragon dictation software. Dictation errors have been sought, but may not have been identified and corrected.    See chart and nursing documentation for additional ED course details.    ____________________________________________         History        Reason for Visit  Abdominal Pain      History of Present Illness  Jerome Kennedy. is a 48 year old male with Crohn's disease who presents with nausea, vomiting, and abdominal pain.    He has been experiencing nausea and vomiting, which prompted his visit today. The abdominal pain is described as sharp and constant, similar to previous episodes related to Crohn's disease flares. The pain began around 4 PM today.    He has a history of hernia for which he received surgical treatment, but the treatment was not successful.           Outside Historian(s)  (EMS, Significant Other, Family, Parent, Caregiver, Friend, Patent examiner, etc.)    Past Medical History[1]    Past Surgical History[2]      Current Facility-Administered Medications:     acetaminophen  (TYLENOL ) tablet 1,000 mg, 1,000 mg, Oral, Q8H SCH, Brynn Shilling, FNP, 1,000 mg at 04/08/24 0555    albuterol  2.5 mg /3 mL (0.083 %) nebulizer solution 2.5 mg, 2.5 mg, Nebulization, Q4H PRN, Brynn Shilling, FNP    enoxaparin  (LOVENOX ) syringe 40 mg, 40 mg, Subcutaneous,  Nightly, Brynn Shilling, FNP, 40 mg at 04/07/24 2023    [START ON 04/12/2024] ergocalciferol-1,250 mcg (50,000 unit) (DRISDOL) capsule 1,250 mcg, 1,250 mcg, Oral, Weekly, Hackley, Kari, FNP    fluticasone  furoate-vilanterol (BREO ELLIPTA ) 100-25 mcg/dose inhaler 1 puff, 1 puff, Inhalation, Daily (RT), Brynn Shilling, FNP, 1 puff at 04/08/24 1037    fluticasone  propionate (FLONASE ) 50 mcg/actuation nasal spray 2 spray, 2 spray, Each Nare, Daily PRN, Brynn Shilling, FNP    methylPREDNISolone  sodium succinate  (SOLU-Medrol ) injection 20 mg, 20 mg, Intravenous, Q8H SCH, Brynn Shilling, FNP, 20 mg at 04/08/24 0555    morphine  injection 2 mg, 2 mg, Intravenous, Q6H PRN **OR** morphine  4 mg/mL injection 4 mg, 4 mg, Intravenous, Q6H PRN, Brynn Shilling, FNP, 4 mg at 04/08/24 0555    nicotine  (NICODERM CQ ) 14 mg/24 hr patch 1 patch, 1 patch, Transdermal, Daily, Brynn Shilling, FNP, 1 patch at 04/08/24 1037    nicotine  polacrilex (NICORETTE) gum 2 mg, 2 mg, Buccal, Q1H PRN **OR** nicotine  polacrilex (NICORETTE) lozenge 2 mg, 2 mg, Buccal, Q1H PRN, Brynn Shilling, FNP    ondansetron  (ZOFRAN -ODT) disintegrating tablet 4 mg, 4 mg, Oral, Q6H PRN **OR** ondansetron  (ZOFRAN ) injection 4 mg, 4 mg, Intravenous, Q6H PRN, Brynn Shilling, FNP, 4 mg at 04/08/24 0555    oxyCODONE  (ROXICODONE ) immediate release tablet 5 mg, 5 mg, Oral, Q6H PRN, Brynn Shilling, FNP, 5 mg at 04/07/24 2021    pantoprazole  (Protonix ) EC tablet 40 mg, 40 mg, Oral, Daily before breakfast, Brynn Shilling, FNP, 40 mg at 04/08/24 1037    QUEtiapine  (SEROQUEL ) tablet 100 mg, 100 mg, Oral, Nightly, Brynn Shilling, FNP, 100 mg at 04/07/24 2022    Allergies  Morphine  and Toradol [ketorolac]    Family History[3]    Short Social History[4]       Physical Exam     ED Triage Vitals   Enc Vitals Group      BP 04/06/24 2210 106/73      Pulse 04/06/24 2210 92      SpO2 Pulse 04/06/24 2225 92      Resp 04/06/24 2210 16      Temp 04/06/24 2210 37.8 ??C (100.1 ??F)      Temp Source 04/06/24 2210 Oral      SpO2 04/06/24 2210 100 %      Weight --       Height --       Head Circumference --       Peak Flow --       Pain Score --       Pain Loc --       Pain Education --       Exclude from Growth Chart --        Constitutional: Alert and oriented. Well appearing and in no distress.  Eyes: Conjunctivae are normal.  ENT       Head: Normocephalic and atraumatic.       Nose: No congestion.       Mouth/Throat: Mucous membranes are moist.       Neck: No stridor.  Hematological/Lymphatic/Immunilogical: No cervical lymphadenopathy.  Cardiovascular: Normal rate, regular rhythm. Normal and symmetric distal pulses are present in all extremities.  Respiratory: Normal respiratory effort. Breath sounds are normal.  Gastrointestinal: multiple surgical scar hernias which are reducible. Diffuse abdominal tenderness.There is no CVA tenderness.  Musculoskeletal: Normal range of motion in all extremities.       Right lower leg: No tenderness or edema.  Left lower leg: No tenderness or edema.  Neurologic: Normal speech and language. No gross focal neurologic deficits are appreciated.  Skin: Skin is warm, dry and intact. No rash noted.  Psychiatric: Mood and affect are normal. Speech and behavior are normal.       Radiology     Colonoscopy   Final Result      CT Abdomen Pelvis W IV Contrast Only   Final Result   - Compared to CT dated 02/26/2024, there is increased wall distention and wall thickening of the rectum and distal sigmoid colon with upstream dilatation of the large bowel and distal ileum. This may related to a Crohn's flare or infectious/inflammatory colitis.   - Similar wall thickening of the distal ileum just proximal to the ileocolic anastomosis, compatible with perianastomotic Crohn's disease.              Procedures including Critical Care          Jearld Christell DEL, MD  Resident  04/08/24 1154         [1]   Past Medical History:  Diagnosis Date    Anemia 04/05/2022    Anxiety     Avascular necrosis of femur head, right    (CMS-HCC) 2011    Bipolar disorder    (CMS-HCC) 2013    Follows the Psychiatry    Cannabis use disorder     COPD (chronic obstructive pulmonary disease)    (CMS-HCC) 2022    Crohn's disease    (CMS-HCC) 1990    Depression 2007    Severe depressive episode with psychotic symptoms    DVT (deep venous thrombosis)    (CMS-HCC) 02/08/2010    DVT of superior vena cava, left brachiocephalic, 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    Myalgia     Rhinovirus infection 10/29/2022    SBO (small bowel obstruction)    (CMS-HCC) 2011   [2]   Past Surgical History:  Procedure Laterality Date    CHOLECYSTECTOMY      COLON SURGERY      partial resection    HERNIA REPAIR      PR ARTHRP INTERCARPAL/CARP/MTCRPL JT INTERPOSITION Left 08/07/2022    Procedure: INTERPOSIT ARTHROPLASTY-INTERCARPAL/CARPOMETACAR;  Surgeon: Bonifacio Cordella HERO, MD;  Location: MAIN OR [facility];  Service: Orthopedics    PR CLOSE ENTEROSTOMY,RESEC+COLOREC ANAS Midline 11/01/2019    Procedure: CLO ENTEROSTOMY; W/RESECT AUGIE FLAKES;  Surgeon: Aloysius Euell Quale, MD;  Location: MAIN OR [place];  Service: Gastrointestinal    PR COLONOSCOPY FLX DX W/COLLJ SPEC WHEN PFRMD Left 01/07/2013    Procedure: COLONOSCOPY, FLEXIBLE, PROXIMAL TO SPLENIC FLEXURE; DIAGNOSTIC, W/WO COLLECTION SPECIMEN BY BRUSH OR WASH;  Surgeon: Liliane JENEANE Marker, MD;  Location: [facility]URES [facility];  Service: Gastroenterology    PR COLONOSCOPY FLX DX W/COLLJ SPEC WHEN PFRMD  03/09/2015    Procedure: COLONOSCOPY, FLEXIBLE, PROXIMAL TO SPLENIC FLEXURE; DIAGNOSTIC, W/WO COLLECTION SPECIMEN BY BRUSH OR WASH;  Surgeon: Artist Jayson Leather, MD;  Location: GI PROC[facility]URES [facility];  Service: Gastroenterology    PR COLONOSCOPY FLX DX W/COLLJ SPEC WHEN PFRMD N/A 10/28/2019    Procedure: COLONOSCOPY, FLEXIBLE, PROXIMAL TO SPLENIC FLEXURE; DIAGNOSTIC, W/WO COLLECTION SPECIMEN BY BRUSH OR WASH;  Surgeon: Derick Bring, MD;  Location: GI PROCEDURES [facility];  Service: Gastroenterology    PR COLONOSCOPY FLX DX W/COLLJ SPEC WHEN PFRMD N/A 10/01/2022    Procedure: COLONOSCOPY, FLEXIBLE, PROXIMAL TO SPLENIC FLEXURE; DIAGNOSTIC, W/WO COLLECTION SPECIMEN BY BRUSH OR WASH;  Surgeon: Darrick Peck, MD;  Location: GI PROCEDURES [facility]  [facility];  Service: Gastroenterology    PR COLONOSCOPY FLX DX W/COLLJ SPEC WHEN PFRMD N/A 11/30/2023    Procedure: COLONOSCOPY, FLEXIBLE, PROXIMAL TO SPLENIC FLEXURE; DIAGNOSTIC, W/WO COLLECTION SPECIMEN BY BRUSH OR WASH;  Surgeon: Audrey Lacinda Hun, MD;  Location: GI [facility]URES [facility];  Service: Gastroenterology    PR EXPLORATORY OF ABDOMEN Midline 09/02/2019    Procedure: EXPLORATORY LAPAROTOMY, EXPLORATORY CELIOTOMY WITH OR WITHOUT BIOPSY(S);  Surgeon: Aloysius Euell Quale, MD;  Location: MAIN OR [facility];  Service: Gastrointestinal    PR EXPLORATORY OF ABDOMEN Midline 11/01/2019 Procedure: EXPLORATORY LAPAROTOMY, EXPLORATORY CELIOTOMY WITH OR WITHOUT BIOPSY(S);  Surgeon: Aloysius Euell Quale, MD;  Location: MAIN OR [facility];  Service: Gastrointestinal    PR FIX FINGER,VOLAR PLATE,I-P JT Left 08/07/2022    Procedure: REPAIR AND RECONSTRUCTION, FINGER, VOLAR PLATE, INTERPHALANGEAL JOINT;  Surgeon: Bonifacio Cordella HERO, MD;  Location: MAIN OR [facility];  Service: Orthopedics    PR FREEING BOWEL ADHESION,ENTEROLYSIS N/A 11/01/2019    Procedure: Enterolysis (Separt Proc);  Surgeon: Aloysius Euell Quale, MD;  Location: MAIN OR [facility];  Service: Gastrointestinal    PR FUSION MC-P JT Left 08/27/2023    Procedure: ARTHRODESIS, METACARPOPHALANGEAL JOINT, WITH OR WITHOUT INTERNAL FIXATION;  Surgeon: Bonifacio Cordella HERO, MD;  Location: OR [facility];  Service: Orthopedics    PR ILEOSCOPY THRU STOMA,BIOPSY N/A 10/14/2019    Procedure: LYNETT; LORN 1/MX;  Surgeon: Thedora Alm Plain, MD;  Location: GI PROCEDURES [facility];  Service: Gastroenterology    PR PART REMOVAL COLON W COLOSTOMY Midline 09/02/2019    Procedure: COLECTOMY, PARTIAL; WITH SKIN LEVEL CECOSTOMY OR COLOSTOMY;  Surgeon: Aloysius Euell Quale, MD;  Location: MAIN OR [facility];  Service: Gastrointestinal    PR REPAIR INCISIONAL HERNIA,REDUCIBLE Midline 09/02/2019    Procedure: REPAIR INIT INCISIONAL OR VENTRAL HERNIA; REDUCIBLE;  Surgeon: Arnette Darice Burnet, MD;  Location: MAIN OR [place];  Service: Trauma    PR UPPER GI ENDOSCOPY,BIOPSY N/A 01/07/2013    Procedure: UGI ENDOSCOPY; WITH BIOPSY, SINGLE OR MULTIPLE;  Surgeon: Liliane JENEANE Marker, MD;  Location: GI PROC[facility]URES [facility];  Service: Gastroenterology    PR UPPER GI ENDOSCOPY,BIOPSY N/A 10/14/2019    Procedure: UGI ENDOSCOPY; WITH BIOPSY, SINGLE OR MULTIPLE;  Surgeon: Thedora Alm Plain, MD;  Location: GI PROCEDURES [facility];  Service: Gastroenterology    PR UPPER GI ENDOSCOPY,DIAGNOSIS N/A 10/01/2022    Procedure: UGI ENDO, INCLUDE ESOPHAGUS, STOMACH, & DUODENUM &/OR JEJUNUM; DX W/WO COLLECTION SPECIMN, BY BRUSH OR WASH;  Surgeon: Darrick Peck, MD;  Location: GI PROCEDURES [facility];  Service: Gastroenterology   [3]   Family History  Problem Relation Age of Onset    Breast cancer Mother     Diabetes Maternal Aunt     Colon cancer Maternal Grandmother     Prostate cancer Maternal Grandfather     Cancer Paternal Grandmother     Crohn's disease Neg Hx    [4]   Social History  Tobacco Use    Smoking status: Every Day     Current packs/day: 1.00     Average packs/day: 0.6 packs/day for 22.5 years (13.0 ttl pk-yrs)     Types: Cigarettes     Start date: 10/02/2001     Passive exposure: Current    Smokeless tobacco: Never    Tobacco comments:     1ppd; down to 2cpd since March 2025   Vaping Use    Vaping status: Some Days    Substances: Nicotine     Passive vaping exposure: Yes   Substance Use Topics  Alcohol use: No     Alcohol/week: 0.0 standard drinks of alcohol    Drug use: Yes     Types: Marijuana     Comment: Smokes 2-3 joints/day        Jearld Christell DEL, MD  Resident  04/08/24 [phone]

## 2024-04-07 NOTE — Unmapped (Signed)
 [facility]   Initial Consultation         Assessment and Recommendations:   Jerome Kennedy. is a 48 y.o. 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 with abdominal pain. The patient is seen in consultation at the request of Kayla LELON Hoe, MD (Emergency Medicine) for Crohn's disease.    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 Denies blood or discoloration of emesis or stool. Patient's Crohn's disease is historically poorly controlled with numerous biologics trialed and difficulty with medication compliance and affordability. Tremfya  (guselkumab ) 01/30/24 but missed 02/2024 dose due to cost; attempted to administer yesterday 9/3 but thinks he injected it wrong. Completed 6-week prednisone  taper for Crohn's flare on 8/16. CRP 17.8 (from 16.5 on last discharge 02/08/24 and 11.6 on 8/17; prev 82.3 on 7/4). ESR 17 (from 15 on 8/17). CT AP w/contrast shows increased wall thickening of rectum and distal sigmoid colon with upstream dilation of large bowel and distal ileum concerning for infectious/inflammatory colitis and stable thickening of distal ileum consistent with perianastomotic CD. Presentation is most concerning for CD flare given minimal biologic exposure, 2 weeks off steroids, imaging findings, and elevated CRP. Less concerning for SBO or hernia incarceration given passing of stool and imaging results. Will plan for steroid initiation and evaluation with colonoscopy given new imaging findings.   - colonoscopy on 9/5, begin bowel prep  - follow-up GIPP and c diff  - fecal calprotectin  - Methylprednisolone  20 mg TID  - Retrain on biologic administration while admitted    GI Pre-Procedure Checklist  Procedure: Colonoscopy  Anticipated Date of Procedure: 9/5  Anticoagulants/Antiplatelets: Not Applicable  Anesthesia Concerns: None  Diet: Order clear liquid and make NPO at 12AM (midnight) day of procedure  Prep: Order SPLIT DOSING Golytely  bowel prep this afternoon. Order bowel prep using Prep for COLONOSCOPY order-set. The patient is adequately prepped when their stool is clear and yellow, like urine. Continue to order bowel prep until the patients stools are clear. The patient can continue drinking prep past midnight if not clear, but must be strictly NPO of all oral intake for at least 2 hours before procedure.    Issues Impacting Complexity of Management:  -None    Recommendations discussed with the patient's primary team. We will continue to follow along with you.    Subjective:       History of Present Illness  Jerome Kennedy. is a 48 year old male with Crohn's disease who presents with abdominal pain and increased bowel movements.    He experiences sharp, constant abdominal pain that began last night, located in his abdomen. The pain is similar to previous flares of his Crohn's disease.    He has had an increase in bowel movements, with approximately five watery, loose stools in the last day, whereas his normal bowel movements are formed. He has noticed red streaks in his stool.    He is on medication for Crohn's disease, administered by injection every four weeks. He took a dose yesterday but had difficulty with the administration. He completed a course of steroids a few months ago.  No recent travel. He is able to keep food down and has not experienced any suspicious symptoms recently.       -I have reviewed the patient's prior records from previous hospitalizations and GI visits as summarized in the HPI    Objective:   Temp:  [36.8 ??C (98.2 ??F)-37.8 ??C (100.1 ??F)] 36.8 ??C (98.2 ??F)  Pulse:  [59-92] 59  SpO2 Pulse:  [92] 92  Resp:  [16-20] 20  BP: (106-123)/(73-77) 123/77  SpO2:  [95 %-100 %] 95 %    Gen: WDWN male in pain, answers questions appropriately  Abdomen: Multiple large hernias present across anterior abdominal surface, soft and reducible but painful to palpation. Overall abdomen is soft, tender to palpation in RLQ and across hernias as noted, no rebound/guarding, no hepatosplenomegaly  Extremities: No edema in the BLEs    Pertinent Labs & Studies:  -I have reviewed the patient's labs from 04/07/24 which show worsening CRP     CT AP:   Impression   - Compared to CT dated 02/26/2024, there is increased wall distention and wall thickening of the rectum and distal sigmoid colon with upstream dilatation of the large bowel and distal ileum. This may related to a Crohn's flare or infectious/inflammatory colitis.   - Similar wall thickening of the distal ileum just proximal to the ileocolic anastomosis, compatible with perianastomotic Crohn's disease.       Alyce Lapine, MS4    This note was written with the assistance of medical student Sean LaFata. I attest that I have reviewed the student note and that the components of the history of the present illness, the physical exam, and the assessment and plan documented were performed by me or were performed in my presence by the student where I verified the documentation and performed (or re-performed) the exam and medical decision making.

## 2024-04-07 NOTE — Unmapped (Signed)
 [facility]   History and Physical       Assessment and Plan     Jerome Kennedy. is a 48 y.o. male who is presenting to [facility] with Exacerbation of Crohn's disease    (CMS-HCC), in the setting of the following pertinent/contributing co-morbidities: stricturing ileocolonic Crohn's disease, COPD, bipolar 1 disorder, history of DVT not on anticoagulation, tobacco use disorder .    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 CD historically poorly controlled due to difficulty with medication adherence.  Follows with [facility]. Recently started on Tremfya  (guselkumab ) 01/30/24 but missed 02/2024 dose due to cost; attempted to administer yesterday 9/3 but thinks he injected it wrong. Completed 6-week prednisone  taper for Crohn's flare on 8/16. CRP 17.8 (from 16.5 on last discharge 02/08/24 and 11.6 on 8/17; prev 82.3 on 7/4). ESR 17 (from 15 on 8/17). CT AP w/contrast shows increased wall thickening of rectum and distal sigmoid colon with upstream dilation of large bowel and distal ileum concerning for infectious/inflammatory colitis and stable thickening of distal ileum consistent with perianastomotic CD. He has had some nausea/vomiting, but last yesterday and none since then, and no BM since yesterday morning but is passing flatus, so lower suspicion for bowel obstruction. Without BM today, I am less suspicious for infectious colitis.   - Follow up GIPP and C diff (may cancel if patient still without BM today)  - Daily CRP  - Consult GI   - LR 1L bolus  - Tylenol  scheduled, oxycodone /morphine  prn pain  - Zofran  prn nausea    Secondary/Additional Active Problems:  Hx DVT: multiple DVTs since 2006, mainly RUE and neck veins. Not on lifelong anticoagulation per Hematology recs due to bleeding risk with IBD (see note from 2022 [facility] hospitalization). He is at elevated risk for clot in setting of Crohn's flare.   - Pharmacologic VTE ppx    COPD without acute exacerbation: continue formulary sub for home Symbicort , albuterol  prn    Bipolar 1 disorder: continue Seroquel     Tobacco use disorder: nicotine  replacement therapy ordered    Prophylaxis  -LMWH    Diet  -Regular diet    Code Status / HCDM  -Full Code, Discussed with patient at the time of admission   -  HCDM (patient stated preference): Jerome Kennedy - Brother [phone]    HCDM (patient stated preference): Jerome Kennedy - Mother - [phone]    Anticipated Medically Ready for Discharge: Anticipated in 2-4 Days    Significant Comorbid Conditions:     -Anemia POA requiring further investigation or monitoring    Issues Impacting Complexity of Management:  -Parenteral controlled medications: IV Morphine     Medical Decision Making: Reviewed records from the following unique sources  prior outpatient records and discharge summaries.    I personally spent greater than 75 minutes face-to-face and non-face-to-face in the care of this patient, which includes all pre, intra, and post visit time on the date of service.  All documented time was specific to the E/M visit and does not include any procedures that may have been performed.    HPI      Jerome Kennedy. is a 48 y.o. male with  history of Crohn's disease, COPD, prior DVT not on anticoagulation, bipolar 1 disorder,  who is presenting to [facility] with Exacerbation of Crohn's disease    (CMS-HCC).    Admitted to [facility] 7/1-7/6 for Crohn's flare, treated with IV methylprednisolone  and discharged on prednisone  taper x 6 weeks.  He had recently been started on Tremfya  01/30/24 but did not get his 02/2024 dose for cost reasons. He felt well after his prednisone  taper ended in mid-August. Has had several ED visits since then for abdominal pain but either eloped or was discharged from ED with PO meds. Has not followed-up in GI clinic. He tried to inject his Tremfya  yesterday but thinks he didn't inject it right and withdrew the needle too soon so didn't get enough of the medicine.     Yesterday evening, developed sharp, constant pain in RLQ 7/10 in severity and nausea and blood-streaked, bilious vomiting x 3. Last BM yesterday morning, watery and loose but non-bloody, none since then. Had been having BMs 4x/day prior to that. Denies abdominal distension. Denies dyspnea, sore throat, chest pain. Has been having some dry cough. Denies dysuria.  Denies recent travel or camping. He has city water . No sick contacts.     ER course was notable for Tmax 100.1, BP 100s/70s. Labs notable for normal WBC, Hgb 12.6 (baseline), normal BMP, ESR 17 (from 15 on 8/17) and CRP 17.8 (from 11.6), lactate within normal limits. CT AP w/contrast shows increaed wall thickening of rectum and distal sigmoid colon with upstream dilation of large bowel and distal ileum concerning for infectious/inflammatory colitis and stable thickening of distal ileum consistent with perianastomotic CD. GIPP and C diff stool studies ordered. He was given morphine  4 mg IV x 3 (despite listed allergy to morphine ) and compazine  IV x 2.       Med Rec Confidence   I reviewed the Medication List. The current list is Accurate    Physical Exam   Temp:  [36.7 ??C (98.1 ??F)-37.8 ??C (100.1 ??F)] 36.7 ??C (98.1 ??F)  Pulse:  [59-92] 83  SpO2 Pulse:  [92] 92  Resp:  [16-20] 20  BP: (103-123)/(71-77) 103/71  SpO2:  [95 %-100 %] 99 %  There is no height or weight on file to calculate BMI.    GEN: Non-toxic-appearing, in NAD, lying in bed  EYES: EOMI  ENT: MMM  CV: RRR, normal S1, S2, no murmurs appreciated  PULM: CTAB, normal WOB, no wheezes or crackles  ABD: soft, non-distended, moderate-sized umbilical hernia, non-tender and fully reducible, hypoactive bowel sounds (some tinkling in RUQ and LUQ), TTP without rebound or guarding in RLQ.   EXT: WWP, no edmea  NEURO: No focal deficits  PSYCH: A+Ox3, appropriate affect

## 2024-04-08 DIAGNOSIS — K50019 Crohn's disease of small intestine with unspecified complications: Principal | ICD-10-CM

## 2024-04-08 LAB — CBC
HEMATOCRIT: 36 % — ABNORMAL LOW (ref 39.0–48.0)
HEMOGLOBIN: 12 g/dL — ABNORMAL LOW (ref 12.9–16.5)
MEAN CORPUSCULAR HEMOGLOBIN CONC: 33.5 g/dL (ref 32.0–36.0)
MEAN CORPUSCULAR HEMOGLOBIN: 30 pg (ref 25.9–32.4)
MEAN CORPUSCULAR VOLUME: 89.6 fL (ref 77.6–95.7)
MEAN PLATELET VOLUME: 8.2 fL (ref 6.8–10.7)
PLATELET COUNT: 216 10*9/L (ref 150–450)
RED BLOOD CELL COUNT: 4.01 10*12/L — ABNORMAL LOW (ref 4.26–5.60)
RED CELL DISTRIBUTION WIDTH: 14.3 % (ref 12.2–15.2)
WBC ADJUSTED: 7.9 10*9/L (ref 3.6–11.2)

## 2024-04-08 LAB — COMPREHENSIVE METABOLIC PANEL
ALBUMIN: 3 g/dL — ABNORMAL LOW (ref 3.4–5.0)
ALKALINE PHOSPHATASE: 115 U/L (ref 46–116)
ALT (SGPT): 8 U/L — ABNORMAL LOW (ref 10–49)
ANION GAP: 14 mmol/L (ref 5–14)
AST (SGOT): 12 U/L (ref <=34)
BILIRUBIN TOTAL: 0.5 mg/dL (ref 0.3–1.2)
BLOOD UREA NITROGEN: <5 mg/dL — ABNORMAL LOW (ref 9–23)
CALCIUM: 8 mg/dL — ABNORMAL LOW (ref 8.7–10.4)
CHLORIDE: 107 mmol/L (ref 98–107)
CO2: 20 mmol/L (ref 20.0–31.0)
CREATININE: 0.84 mg/dL (ref 0.73–1.18)
EGFR CKD-EPI (2021) MALE: >90 mL/min/1.73m2 (ref >=60)
GLUCOSE RANDOM: 109 mg/dL (ref 70–179)
POTASSIUM: 3.5 mmol/L (ref 3.4–4.8)
PROTEIN TOTAL: 6.1 g/dL (ref 5.7–8.2)
SODIUM: 141 mmol/L (ref 135–145)

## 2024-04-08 LAB — C-REACTIVE PROTEIN: C-REACTIVE PROTEIN: 35.1 mg/L — ABNORMAL HIGH (ref <=10.0)

## 2024-04-08 MED ORDER — NICOTINE (POLACRILEX) 2 MG GUM
BUCCAL | 0 refills | 5.00000 days | Status: CP | PRN
Start: 2024-04-08 — End: 2024-05-08

## 2024-04-08 MED ORDER — NICOTINE 14 MG/24 HR DAILY TRANSDERMAL PATCH
MEDICATED_PATCH | TRANSDERMAL | 1 refills | 28.00000 days | Status: CP
Start: 2024-04-08 — End: ?
  Filled 2024-04-08: qty 28, 28d supply, fill #0

## 2024-04-08 MED ORDER — NICOTINE (POLACRILEX) 4 MG BUCCAL LOZENGE
BUCCAL | 0 refills | 3.00000 days | Status: CP | PRN
Start: 2024-04-08 — End: 2024-05-08

## 2024-04-08 MED ORDER — PANTOPRAZOLE 40 MG TABLET,DELAYED RELEASE
ORAL_TABLET | Freq: Every day | ORAL | 2 refills | 90.00000 days | Status: CP
Start: 2024-04-08 — End: 2025-01-03

## 2024-04-08 MED ADMIN — Propofol (DIPRIVAN) injection: INTRAVENOUS | @ 13:00:00 | Stop: 2024-04-08

## 2024-04-08 MED ADMIN — oxyCODONE (ROXICODONE) immediate release tablet 5 mg: 5 mg | ORAL | Stop: 2024-04-21

## 2024-04-08 MED ADMIN — azithromycin (ZITHROMAX) tablet 500 mg: 500 mg | ORAL | @ 18:00:00 | Stop: 2024-04-08

## 2024-04-08 MED ADMIN — methylPREDNISolone sodium succinate (SOLU-Medrol) injection 20 mg: 20 mg | INTRAVENOUS | @ 10:00:00 | Stop: 2024-04-08

## 2024-04-08 MED ADMIN — acetaminophen (TYLENOL) tablet 1,000 mg: 1000 mg | ORAL | @ 02:00:00

## 2024-04-08 MED ADMIN — peg-electrolyte soln (GoLYTELY) solution 2,000 mL: 2000 mL | ORAL | @ 10:00:00 | Stop: 2024-04-08

## 2024-04-08 MED ADMIN — acetaminophen (TYLENOL) tablet 1,000 mg: 1000 mg | ORAL | @ 18:00:00 | Stop: 2024-04-08

## 2024-04-08 MED ADMIN — morphine 4 mg/mL injection 4 mg: 4 mg | INTRAVENOUS | @ 10:00:00 | Stop: 2024-04-08

## 2024-04-08 MED ADMIN — QUEtiapine (SEROQUEL) tablet 100 mg: 100 mg | ORAL

## 2024-04-08 MED ADMIN — nicotine (NICODERM CQ) 14 mg/24 hr patch 1 patch: 1 | TRANSDERMAL | @ 15:00:00 | Stop: 2024-04-08

## 2024-04-08 MED ADMIN — fluticasone furoate-vilanterol (BREO ELLIPTA) 100-25 mcg/dose inhaler 1 puff: 1 | RESPIRATORY_TRACT | @ 15:00:00 | Stop: 2024-04-08

## 2024-04-08 MED ADMIN — enoxaparin (LOVENOX) syringe 40 mg: 40 mg | SUBCUTANEOUS

## 2024-04-08 MED ADMIN — phenylephrine 1 mg/10 mL (100 mcg/mL) injection Syrg: INTRAVENOUS | @ 13:00:00 | Stop: 2024-04-08

## 2024-04-08 MED ADMIN — ondansetron (ZOFRAN) injection 4 mg: 4 mg | INTRAVENOUS

## 2024-04-08 MED ADMIN — ondansetron (ZOFRAN) injection 4 mg: 4 mg | INTRAVENOUS | @ 10:00:00 | Stop: 2024-04-08

## 2024-04-08 MED ADMIN — morphine 4 mg/mL injection 4 mg: 4 mg | INTRAVENOUS | @ 02:00:00 | Stop: 2024-04-21

## 2024-04-08 MED ADMIN — pantoprazole (Protonix) EC tablet 40 mg: 40 mg | ORAL | @ 15:00:00 | Stop: 2024-04-08

## 2024-04-08 MED ADMIN — lactated Ringers infusion: 10 mL/h | INTRAVENOUS | @ 12:00:00 | Stop: 2024-04-08

## 2024-04-08 MED ADMIN — predniSONE (DELTASONE) tablet 40 mg: 40 mg | ORAL | @ 18:00:00 | Stop: 2024-04-08

## 2024-04-08 MED ADMIN — lidocaine (PF) (XYLOCAINE-MPF) 20 mg/mL (2 %) injection: INTRAVENOUS | @ 13:00:00 | Stop: 2024-04-08

## 2024-04-08 MED ADMIN — acetaminophen (TYLENOL) tablet 1,000 mg: 1000 mg | ORAL | @ 10:00:00 | Stop: 2024-04-08

## 2024-04-08 MED ADMIN — methylPREDNISolone sodium succinate (SOLU-Medrol) injection 20 mg: 20 mg | INTRAVENOUS | @ 02:00:00

## 2024-04-08 NOTE — Unmapped (Signed)
 Shift Summary  Pain management interventions included administration of oxyCODONE  and morphine , with some fluctuation in pain scores during the shift.  Enoxaparin  was administered for anti-embolism prophylaxis.  Peripheral IV site remained intact and required no intervention.  Urinalysis was performed, with some abnormal findings noted.  Overall, comfort and safety measures were maintained throughout the shift.    Absence of Hospital-Acquired Illness or Injury: Peripheral IV site remained clean, dry, and intact throughout the shift, with no interventions needed; skin protection was maintained with incontinence pads and anti-embolism measures included enoxaparin  administration.    Optimal Comfort and Wellbeing: Abdominal pain fluctuated during the shift, with pain scores ranging from 4 to 8; pain management included administration of oxyCODONE  and morphine , though one intervention was declined.

## 2024-04-08 NOTE — Unmapped (Signed)
 [facility]   Progress Note         Assessment and Recommendations:   Jerome Kiener. is a 48 y.o. male with a PMHx of Crohn's disease of small and large intestine (dx 1990's) s/p ileocecectomy (2003) c/b SBO 2/2 to stricturing disease s/p right hemicolectomy and end-ileostomy (08/2019) complicated by short gut syndrome and high ostomy output s/p takedown and reanastomosis (10/2019)  who presented to [facility] with abdominal pain. The patient is seen in consultation at the request of Kayla LELON Hoe, MD (Emergency Medicine) for Crohn's disease.     C/f Crohn's flare - Stricturing ileocolonic Crohn's disease   Patient endorses one day of severe RLQ abdominal pain, nausea, vomiting, and 4-5 episodes of diarrhea yesterday (no BM today). Denies blood or discoloration of emesis or stool. Patient's Crohn's disease is historically poorly controlled with numerous biologics trialed and difficulty with medication compliance and affordability. Tremfya  (guselkumab ) 01/30/24 but missed 02/2024 dose due to cost; attempted to administer yesterday 9/3 but thinks he injected it wrong. Completed 6-week prednisone  taper for Crohn's flare on 8/16. CRP 17.8 (from 16.5 on last discharge 02/08/24 and 11.6 on 8/17; prev 82.3 on 7/4). ESR 17 (from 15 on 8/17). CT AP w/contrast shows increased wall thickening of rectum and distal sigmoid colon with upstream dilation of large bowel and distal ileum concerning for infectious/inflammatory colitis and stable thickening of distal ileum consistent with perianastomotic CD. Of note, patient has had similar findings on recent CT AP that was felt to be secondary to resolving sigmoid volvulus. GIPP positive for Campylobacter. Colonoscopy 9/5 w/ poor prep but demonstrated inflammation in neo-terminal ileum, Rutgeerts Score i4. Bx taken in rectum and sigmoid.    Presentation likely secondary to Campylobacter infection. Will treat infection and taper steroids.     - Start azithromycin  500mg  daily x3 days for Campylobacter   - Prednisone  taper with 40mg  x2 days, 20mg  x 2 days, and 10mg  x2 days   - Has GI outpatient follow up on 04/19/24, provider notified     Issues Impacting Complexity of Management:  -None    Recommendations discussed with the patient's primary team. We will signoff    Subjective:   Patient seen at GI procedures. No acute distress and agreed to colonoscopy.         Objective:   Temp:  [36.2 ??C (97.2 ??F)-36.8 ??C (98.2 ??F)] 36.7 ??C (98.1 ??F)  Pulse:  [71-81] 81  SpO2 Pulse:  [65-80] 79  Resp:  [10-20] 17  BP: (90-119)/(55-81) 101/73  SpO2:  [98 %-100 %] 99 %    Gen: seen prior to procedure, NAD and conversing normally    Pertinent Labs & Studies:  -I have reviewed the patient's labs from 04/08/24 which show stable Hgb, stable renal function (SCr, electrolytes), and worsening CRP     GIPP: campylobacter +

## 2024-04-08 NOTE — Unmapped (Signed)
 Inpatient Tobacco Cessation Counseling Note    This medical encounter was conducted virtually using [email or web address]  TeleHealth protocols.    I have identified myself to the patient and conveyed my credentials to Jerome Kennedy  I have explained the capabilities and limitations of telemedicine and the patient/proxy and myself both agree that it is appropriate for their current circumstances/symptoms.     Contact Information  Person Contacted: Clary Haren Jr.         Contact Phone number: [phone]      Phone Outcome: spoke with pt  Is there someone else in the room? No.     Patient's location at the time of the telephone visit: Hospitalized at [facility]   Provider's location at the time of the telephone visit: At home, in [place]       Purpose of contact:     Tobacco Treatment Team (TTP) received inpatient consult from Hospitalist for Mr. Jerome Kennedy, 48 y.o. male. Pt participated in a telephone visit for tobacco cessation counseling.  Patient was admitted to hospital for Crohn's disease with complication, unspecified gastrointestinal tract location    (CMS-HCC) [K50.919]. Patient consented to telephone visit given due to social isolation measures in place due to the COVID-19 pandemic.     Tobacco Use History and Assessment  Time Since Last Tobacco Use: 1 to 7 days ago  Tobacco Withdrawal (Past 24 Hours): None noted  Type of Tobacco Products Used: Cigarettes  Quantity Used: 2  Quantity Per: day  Other Household Members Use Tobacco: No  Medications Used in Past Attempts: Nicotine  Patch  Previously seen by NDP?: Hospital Inpatient    Behavioral Assessment  Why Uses: 1. habit 2. stress-relief  Reasons to Become Tobacco Free: health  Barriers/Challenges: 1. long-standing habit 2. stress  Strategies: pt can use 1. NRT to manage cravings 2. hand-to-mouth replacements    NOTE: Pt denies having cravings to smoke and states he smokes 2cpd. Pt confirmed he has been reducing his smoking and he used to smoke 20cpd. Pt started cutting back over the course of the last 6 months. Pt voices desire to become tobacco-free and states he wants to stay quit after discharge. Pt does not live with anyone that smokes and reports he has been using nicotine  patches at home. Pt agrees to having nicotine  gum/lozenge ordered in addition to the patch he currently has on and for prescriptions to go with him at discharge. SW offered encouragement to pt and reinforced health benefits of becoming tobacco-free in context of Chron's. SW provided pt with contact information, physical improvements related to tobacco cessation, and available resources (including outpatient Tobacco Treatment Program at [facility] and [place] [facility]).    Treatment Plan  Please see below for medication recommendations in bold.   Cessation Meds Currently Using: Patch 14mg   Medications Recommended During Hospitalization: Patch 14mg , Lozenge 2mg , Gum 2mg   Outpatient/Discharge Medications Recommended: Patch 14mg , Lozenge 2mg , Gum 2mg   Plan to Obtain Outpatient Meds: TTS messaged providers for Rx  Patient's Plan Post Discharge/Visit: Plan to quit as soon as possible    As part of this Telephone Visit, no in-person exam was conducted.     I personally spent 11 minutes counseling the patient via telephone about tobacco cessation.  I spent an additional 8 minutes on pre- and post-visit activities.      The patient was physically located in   or a state in which I am permitted to provide care. The patient and/or parent/guardian understood that s/he may incur  co-pays and cost sharing, and agreed to the telemedicine visit. The visit was reasonable and appropriate under the circumstances given the patient's presentation at the time.     The patient and/or parent/guardian has been advised of the potential risks and limitations of this mode of treatment (including, but not limited to, the absence of in-person examination) and has agreed to be treated using telemedicine. The patient's/patient's family's questions regarding telemedicine have been answered.      If the visit was completed in an ambulatory setting, the patient and/or parent/guardian has also been advised to contact their provider???s office for worsening conditions, and seek emergency medical treatment and/or call 911 if the patient deems either necessary.     Visit Format/Coding: Telephone     Coding: 01032 (11-20 minutes)  Service rendered over the phone most consistent with: Tobacco cessation counseling, greater than 10 minutes (00592)      Mliss Fair, LCSW, NCTTP  Clinical Social Worker / Tobacco Treatment Specialist  [facility]  Phone: [phone]

## 2024-04-08 NOTE — Unmapped (Signed)
 Physician Discharge Summary [facility]  [facility] OBSERVATION [facility]  [address]  [place] [address]  Dept: [phone]  Loc: [phone]     Identifying Information:   Jerome Kennedy  15-Jun-1976  [id number]    Primary Care Physician: Pcp, None Per Patient     Code Status: Full Code    Admit Date: 04/07/2024    Discharge Date: 04/08/2024     Discharge To: Home    Discharge Service: [facility] - Hospitalist [place] APP     Discharge Attending Physician: Sotero Ban, FNP    Discharge Diagnoses:  Principal Problem:    Exacerbation of Crohn's disease    (CMS-HCC) (POA: Yes)  Active Problems:    Crohn's disease    (CMS-HCC) (POA: Yes)    Bipolar 1 disorder    (CMS-HCC) (POA: Yes)    COPD (chronic obstructive pulmonary disease)    (CMS-HCC) (POA: Yes)    Tobacco use disorder (POA: Yes)  Resolved Problems:    Homelessness (POA: Not Applicable)      Outpatient Provider Follow Up Issues:   [ ]  Follow-up surgical pathology from colonoscopy on 9/5  [ ]  Follow-up C. Difficile, pending at time of discharge  [ ]  Outpatient GI follow-up 04/19/2024    Hospital Course:   Unknown Jerome Jr. is a 48 year old male with a history of stricturing ileocolonic Crohn's disease, COPD, bipolar 1 disorder, prior DVT not on anticoagulation, and tobacco use disorder, who was admitted on 04/07/2024 for an exacerbation of Crohn's disease presenting as acute abdominal pain, increased bowel movements, and concern for flare.     Exacerbation of Crohn's Disease - Stricturing Ileocolonic Crohn's Disease  - Campylobacter infection  He presented with one day of severe right lower quadrant abdominal pain, nausea, vomiting, and 4-5 episodes of watery diarrhea, with no bowel movement on the day of admission but passing flatus. He reported blood-streaked, bilious emesis and red streaks in stool. His Crohn's disease has been historically poorly controlled due to difficulty with medication adherence and affordability, with multiple prior biologic trials. He recently started Tremfya  (guselkumab ) on 01/30/24 but missed the July dose due to cost and had difficulty administering the most recent dose on 04/06/24. He completed a 6-week prednisone  taper for a prior flare on 03/19/24. On admission, CRP was 17.8 (previously 16.5 on 02/08/24, 11.6 on 03/20/24, and 82.3 on 02/05/24), and ESR was 17 (from 15 on 03/20/24). CT abdomen/pelvis with contrast showed increased wall thickening of the rectum and distal sigmoid colon with upstream dilation of the large bowel and distal ileum, concerning for infectious/inflammatory colitis, and stable thickening of the distal ileum compatible with perianastomotic Crohn's disease. Colonoscopy with biopsy was performed on 04/08/24. He was managed with IV methylprednisolone . GI recommended further evaluation with fecal calprotectin and infectious workup.  Fecal calprotectin unable to be obtained prior to discharge. C. difficile pending at time of discharge.  Patient was found to have Campylobacter infection on GIPP.  GI recommended a 3-day course of azithromycin  for management.  At discharge, he will transition to a short oral prednisone  taper.  Secondary to resolution of abdominal pain, and minimal usage of as needed medications for management, opioid prescription was not provided at discharge.  Patient has outpatient follow-up scheduled with GI on 04/19/2024.    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 He received pharmacologic VTE prophylaxis with enoxaparin  during hospitalization.     COPD    He has COPD without evidence of acute exacerbation during this admission. He continued inhaled therapy as a formulary substitute for home Symbicort  and used albuterol  as needed.     Bipolar 1 Disorder    His bipolar 1 disorder was managed with continuation of Seroquel  during hospitalization. Tobacco Use Disorder    He has a long-standing history of tobacco use, currently smoking 2 cigarettes per day, reduced from 20 per day over the past 6 months. He reported no withdrawal symptoms and expressed motivation to quit, with nicotine  patch use at home and agreement to add nicotine  gum/lozenge during and after hospitalization. He received inpatient tobacco cessation counseling and was provided with outpatient resources and medication recommendations for continued cessation efforts including nicotine  patch, lozenges, and gum as needed at discharge.    Procedures:  Colonoscopy 04/08/2024  ______________________________________________________________________  Discharge Medications:     Your Medication List        STOP taking these medications      oxyCODONE  5 MG immediate release tablet  Commonly known as: ROXICODONE             START taking these medications      azithromycin  500 MG tablet  Commonly known as: ZITHROMAX   Take 1 tablet (500 mg total) by mouth daily for 2 days.  Start taking on: April 09, 2024     predniSONE  10 MG tablet  Commonly known as: DELTASONE   Take 4 tablets (40 mg total) by mouth daily for 1 day, THEN 2 tablets (20 mg total) daily for 2 days, THEN 1 tablet (10 mg total) daily for 2 days.  Start taking on: April 09, 2024            CONTINUE taking these medications      empty container Misc  Use as directed     ergocalciferol-1,250 mcg (50,000 unit) 1,250 mcg (50,000 unit) capsule  Commonly known as: DRISDOL  Take 1 capsule (1,250 mcg total) by mouth once a week.     ferrous sulfate  325 (65 FE) MG tablet  Take 1 tablet (325 mg total) by mouth in the morning.     FLONASE  ALLERGY RELIEF 50 mcg/actuation nasal spray  Generic drug: fluticasone  propionate  2 sprays into each nostril daily as needed.     nicotine  21 mg/24 hr patch  Commonly known as: NICODERM CQ   Place 1 patch on the skin daily.     ondansetron  4 MG disintegrating tablet  Commonly known as: ZOFRAN -ODT  Take 1 tablet (4 mg total) by mouth every eight (8) hours as needed for nausea for up to 7 days.     OPTICHAMBER DIAMOND VHC Spcr  Generic drug: inhalational spacing device     pantoprazole  40 MG tablet  Commonly known as: Protonix   Take 1 tablet (40 mg total) by mouth daily.     QUEtiapine  100 MG tablet  Commonly known as: SEROQUEL   Take 1 tablet (100 mg total) by mouth nightly.     SYMBICORT  80-4.5 mcg/actuation inhaler  Generic drug: budesonide -formoterol   Inhale 2 puffs  in the morning.     TREMFYA  PEN 200 mg/2 mL Pnij  Generic drug: guselkumab   Inject the contents of 1 pen (200 mg) under the skin every twenty-eight (28) days.     VENTOLIN  HFA 90 mcg/actuation inhaler  Generic drug: albuterol   Inhale 2 puffs every six (6) hours as needed.  Allergies:  Morphine  and Toradol [ketorolac]  ______________________________________________________________________  Pending Test Results (if blank, then none):  Pending Labs       Order Current Status    Clostridium difficile Assay In process    Surgical pathology exam In process            Most Recent Labs:  All lab results last 24 hours -   Recent Results (from the past 24 hours)   Urinalysis with Microscopy with Culture Reflex    Collection Time: 04/07/24 10:02 PM   Result Value Ref Range    Color, UA Light Yellow     Clarity, UA Clear     Specific Gravity, UA 1.019 1.003 - 1.030    pH, UA 6.5 5.0 - 9.0    Leukocyte Esterase, UA Negative Negative    Nitrite, UA Negative Negative    Protein, UA Negative Negative    Glucose, UA Trace (A) Negative    Ketones, UA Negative Negative    Urobilinogen, UA <2.0 mg/dL <7.9 mg/dL    Bilirubin, UA Negative Negative    Blood, UA Negative Negative    RBC, UA 1 <=3 /HPF    WBC, UA 1 <=2 /HPF    Squam Epithel, UA 4 0 - 5 /HPF    Bacteria, UA None Seen None Seen /HPF    Mucus, UA Rare (A) None Seen /HPF   GI Pathogen Panel (instead of Stool O&P)    Collection Time: 04/07/24 10:02 PM   Result Value Ref Range    Adenovirus F 40/41 PCR Not Detected Not Detected    Astrovirus PCR Not Detected Not Detected    Norovirus GI/GII PCR Not Detected Not Detected    Rotavirus A PCR Not Detected Not Detected    Campylobacter PCR Detected (A) Not Detected    E. coli O157 Not Performed Not Detected, Not Performed    Enteropathogenic E. coli (EPEC) PCR Not Detected Not Detected, Not Performed    Enterotoxigenic E. coli (ETEC) PCR Not Detected Not Detected    Plesiomonas shigelloides PCR Not Detected Not Detected    Salmonella PCR Not Detected Not Detected    Shiga-like Toxin-Producing E. coli (STEC) PCR Not Detected Not Detected    Shigella/ Enteroinvasive E. coli (EIEC) PCR Not Detected Not Detected    Yersinia enterocolitica PCR Not Detected Not Detected    Cryptosporidium PCR Not Detected Not Detected    Cyclospora cayetanensis PCR Not Detected Not Detected    Entamoeba histolytica PCR Not Detected Not Detected    Giardia lamblia PCR Not Detected Not Detected   Comprehensive Metabolic Panel    Collection Time: 04/08/24  5:13 AM   Result Value Ref Range    Sodium 141 135 - 145 mmol/L    Potassium 3.5 3.4 - 4.8 mmol/L    Chloride 107 98 - 107 mmol/L    CO2 20.0 20.0 - 31.0 mmol/L    Anion Gap 14 5 - 14 mmol/L    BUN <5 (L) 9 - 23 mg/dL    Creatinine 9.15 9.26 - 1.18 mg/dL    eGFR CKD-EPI (7978) Male >90 >=60 mL/min/1.70m2    Glucose 109 70 - 179 mg/dL    Calcium  8.0 (L) 8.7 - 10.4 mg/dL    Albumin 3.0 (L) 3.4 - 5.0 g/dL    Total Protein 6.1 5.7 - 8.2 g/dL    Total Bilirubin 0.5 0.3 - 1.2 mg/dL    AST 12 <=65 U/L    ALT 8 (L) 10 -  49 U/L    Alkaline Phosphatase 115 46 - 116 U/L   CBC    Collection Time: 04/08/24  5:13 AM   Result Value Ref Range    WBC 7.9 3.6 - 11.2 10*9/L    RBC 4.01 (L) 4.26 - 5.60 10*12/L    HGB 12.0 (L) 12.9 - 16.5 g/dL    HCT 63.9 (L) 60.9 - 48.0 %    MCV 89.6 77.6 - 95.7 fL    MCH 30.0 25.9 - 32.4 pg    MCHC 33.5 32.0 - 36.0 g/dL    RDW 85.6 87.7 - 84.7 %    MPV 8.2 6.8 - 10.7 fL    Platelet 216 150 - 450 10*9/L   C-reactive protein Collection Time: 04/08/24  5:13 AM   Result Value Ref Range    CRP 35.1 (H) <=10.0 mg/L       Relevant Studies/Radiology (if blank, then none):  Colonoscopy  Result Date: 04/08/2024  _______________________________________________________________________________ Patient Name: Jerome Kennedy           Procedure Date: 04/08/2024 8:44 AM MRN: [id number]                     Date of Birth: 03-20-1976 Admit Type: Inpatient                 Age: 48 Room: [facility]          Gender: Male Note Status: Finalized                Instrument Name: [id number] _______________________________________________________________________________  Procedure:             Colonoscopy Indications:           Patient with ileal colonic stricturing Crohn's Disease                        recently started on guselkumab  01/30/24 with new                        persistent abnormal CT of the GI tract- wall                        distension and wall thickening of the rectum and                        distal sigmoid colon with upstream dilation of the                        large bowel and distal ileum. Recent colonoscopy                        without any colonic pathology. Providers:             KATHERINE ELIZABETH SCHOLAND, MD, KHUSHABU K. PATEL,                        BOBETTA IVAR FINDER, LPN Referring MD:          Medicines:             Propofol  per Anesthesia Complications:         No immediate complications. _______________________________________________________________________________ Procedure:             Pre-Anesthesia Assessment:                        -  Prior to the procedure, a History and Physical was                        performed, and patient medications and allergies were                        reviewed. The patient's tolerance of previous                        anesthesia was also reviewed. The risks and benefits                        of the procedure and the sedation options and risks                        were discussed with the patient. All questions were                        answered, and informed consent was obtained. Prior                        Anticoagulants: The patient has taken no anticoagulant                        or antiplatelet agents [Days Prior to Procedure]. ASA                        Grade Assessment: II - A patient with mild systemic                        disease. After reviewing the risks and benefits, the                        patient was deemed in satisfactory condition to                        undergo the procedure.                        After obtaining informed consent, the scope was passed                        under direct vision. Throughout the procedure, the                        patient's blood pressure, pulse, and oxygen                        saturations were monitored continuously. The                        Colonoscope was introduced through the anus and                        advanced to the the ileocolonic anastomosis. The                        ileocolonic anastomosis, the neo-terminal ileum and  the rectum were photographed. The quality of the bowel                        preparation was poor.                                                                                Findings:      Hemorrhoids were found on perianal exam.      Diffuse inflammation characterized by congestion (edema), loss of      vascularity, inflammation, and deep ulcerations was found in the      neo-terminal Ileum. The inflammation was graded as Rutgeerts Score i3 to      i4 (diffuse inflammation with large deep lesions and/or narrowing).      Normal mucosa was found in the entire colon.      The lumen of the rectum, sigmoid colon, descending colon and transverse      colon was moderately dilated. There was an area of potential external      compression at the descending colon-sigmoid junction.      Biopsies were taken with a cold forceps in the rectum and in the sigmoid      colon for histology.      The exam was otherwise without abnormality on direct and retroflexion      views.      Copious quantities of stool was found in the entire colon, making      visualization difficult. More than 2 cannisters or liquid stool was      suctioned from the colon with incomplete visualization of the colon      following this; however, there was adequate visualization of the areas      of concern from the recent CT.                                                                                Estimated Blood Loss:      Estimated blood loss: none. Impression:            - Preparation of the colon was poor.                        - Hemorrhoids found on perianal exam.                        - Crohn's disease with ileitis. Inflammation was                        found. This was graded as Rutgeerts Score i3- i4.                        - Normal mucosa in the entire examined colon.                        -  Dilated in the rectum, in the sigmoid colon, in the                        descending colon and in the transverse colon.                        - The examination was otherwise normal on direct and                        retroflexion views.                        - Stool in the entire examined colon.                        - Biopsies were taken with a cold forceps for                        histology in the rectum and in the sigmoid colon. Recommendation:        - Further recommendations can be made by the GI                        consult team. Please contact them if necessary.                        - Return patient to hospital ward for ongoing care.                        - Resume previous diet today.                        - Continue present medications.                        - Repeat colonoscopy PRN.                                                                                Procedure Code(s):     --- Professional ---                        [phone], Colonoscopy, flexible; with biopsy, single or multiple Diagnosis Code(s):     --- Professional ---                        K64.9, Unspecified hemorrhoids                        K50.00, Crohn's disease of small intestine without                        complications                        K59.39, Other megacolon  R93.3, Abnormal findings on diagnostic imaging of                        other parts of digestive tract CPT copyright 2023 [employer] Association. All rights reserved. The codes documented in this report are preliminary and upon coder review may be revised to meet current compliance requirements. Electronically Signed By Celestina Crank ________________________________ CRANK ALMARIE CELESTINA, MD 04/08/2024 9:48:17 AM The attending physician was present throughout the entire procedure including the insertion, viewing, and removal of the endoscope. This procedure note has been electronically signed by: KATHERINE SCHOLAND , MD Number of Addenda: 0 Note Initiated On: 04/08/2024 8:44 AM    CT Abdomen Pelvis W IV Contrast Only  Result Date: 04/07/2024  EXAM: CT ABDOMEN PELVIS W CONTRAST ACCESSION: [id number] UN REPORT DATE: 04/07/2024 7:36 AM CLINICAL INDICATION: 48 years old with Abdominal pain with significant surgical scar hernias, history of Crohn's  COMPARISON: CT abdomen pelvis 02/26/2024 and priors. TECHNIQUE: A helical CT scan of the abdomen and pelvis was obtained following IV contrast from the lung bases through the pubic symphysis. Images were reconstructed in the axial plane. Coronal and sagittal reformatted images were also provided for further evaluation. FINDINGS: LOWER CHEST: Unremarkable. LIVER: Normal liver contour.  No focal liver lesions. BILIARY: The gallbladder is surgically absent. No biliary ductal dilatation.  SPLEEN: Normal in size and contour. PANCREAS: Normal pancreatic contour.  No focal lesions.  No ductal dilation. ADRENAL GLANDS: Normal appearance of the adrenal glands. KIDNEYS/URETERS: Symmetric renal enhancement.  No hydronephrosis.  No solid renal mass. BLADDER: Decompressed, limiting evaluation. REPRODUCTIVE ORGANS: Unremarkable. GI TRACT: Postsurgical changes from right hemicolectomy and ileocecectomy with ileocolic anastomosis visualized in the right lower quadrant. Duodenum is normal in course and caliber. Bowel wall thickening of the rectum with upstream dilatation of the colon and distal ileum, with distal ileum measuring up to 5.4 cm in diameter (2:42). There is wall thickening of the distal ileum just proximal to the right lower quadrant anastomosis, similar to prior (2:57). The more proximal ileum and jejunum are not dilated. A few skip lesions are identified (3:41). PERITONEUM, RETROPERITONEUM AND MESENTERY: No free air.  No ascites.  No fluid collection. LYMPH NODES: Multiple prominent central mesenteric abdominal nodes, similar to prior and likely reactive. VESSELS: Hepatic and portal veins are patent.  Normal caliber aorta. Prominent subcutaneous varices identified throughout the abdominal wall, left greater than right. Enlargement of the azygos and hemiazygos, question SVC obstruction. Collateralization also identified throughout the paraspinal musculature. BONES and SOFT TISSUES: No aggressive osseous lesions. Moderate degenerative disc disease with mild retrolisthesis at L4-L5. Rectus diastases with extensive postsurgical changes along the anterior abdominal wall.     - Compared to CT dated 02/26/2024, there is increased wall distention and wall thickening of the rectum and distal sigmoid colon with upstream dilatation of the large bowel and distal ileum. This may related to a Crohn's flare or infectious/inflammatory colitis. - Similar wall thickening of the distal ileum just proximal to the ileocolic anastomosis, compatible with perianastomotic Crohn's disease.    ______________________________________________________________________  Discharge Instructions:     Diet Instructions Discharge diet (specify)      Discharge Nutrition Therapy: Regular            Follow Up instructions and Outpatient Referrals     Discharge instructions      Discharge instructions          Other Instructions       Discharge instructions  You were found to have a viral infection of your intestine called Campylobacter.  We have included information on Campylobacter and how to keep yourself another safe.  We are starting you on an antibiotic called azithromycin  that you will take for 3 days.  Please take this medication until it is complete.  GI is also recommending that you do a short prednisone  taper for your Crohn's disease.  Please also take this medication until it is complete.  Please make sure that you attend your upcoming outpatient follow-up with GI.      Call your doctor right away if you have any of the following:        Severe pain or bloating in your abdomen after meals      Sores in your mouth      Sores in your anal area (around your rectum)      Fever above 101.0 F or chills      Poor appetite or weight loss      Bloody diarrhea      Nausea or vomiting      Skin rashes or skin that weeps (or drains)      Changes in your vision    Discharge instructions      What is a Campylobacter infection?   A Campylobacter infection is an infection that causes sudden diarrhea, stomach cramping, and pain in the area around the belly button. It is caused by a type of bacteria called Campylobacter.  Campylobacter bacteria are often found in chicken, [place], and other types of poultry. These bacteria are also found in other types of meat. Most often, people get infected with campylobacter when they:  Eat undercooked chicken or other meats  Eat foods that were prepared using knives, cutting boards, or other kitchen tools that were used on raw chicken or meat and were not washed  Drink milk that is not pasteurized, or eat foods made with milk that is not pasteurized  What are the symptoms of a Campylobacter infection?   The symptoms include:  Diarrhea that starts suddenly (and can be bloody)  Stomach cramps  Pain in the area around the belly button (sometimes spreading to the right side of the belly)  For about a day before the above symptoms start, a third of those with a Campylobacter infection also get:  High fever  Shaking  Body aches  Dizziness  Confusion  Diarrhea caused by Campylobacter usually lasts about a week and then goes away on its own. Belly pain sometimes continues even after diarrhea goes away.  In very few cases, some people develop pain in their ankles, knees, or other joints after having a Campylobacter infection. This pain usually goes away on its own. In very rare cases, some people develop a different problem called Guillain-Barre syndrome after having a Campylobacter infection. In Guillain-Barre syndrome, a person's infection-fighting system (called the immune system) attacks their own nervous system. This can cause muscle weakness on both sides of the body.  In children, the symptoms of Campylobacter infection can be more severe. Children can have the same symptoms listed above, but they can also have vomiting and a higher fever. In children, the fever can even lead to seizures.  Should I see a doctor or nurse?   See your doctor or nurse if you:  Have severe belly pain  Cannot eat or drink  Vomit blood, or have blood in your bowel movements  Have a fever higher than 100.4??F (38??C)  Have diarrhea that  lasts more than a week  Have any medical problem that makes it hard for you to fight infection (such as cancer or AIDS), or take medicines to suppress your immune system  Young children and older adults with symptoms should see their doctor or nurse. That's because people in these groups can get dehydrated more easily.  Will I need tests?   Probably not, but your doctor might do a test on a sample of bowel movement to check what type of infection you have.  How is Campylobacter infection treated? Treatment is not usually needed. Most people get better without treatment.  People who are very sick, who have a weakened immune system, or who do not get better after a week can get antibiotics to fight the infection.  Is there anything I can do on my own to feel better?   Yes. You can:  Drink enough liquids so your body does not get dehydrated. Dehydration is when the body loses too much water .  Eat small meals that do not have a lot of fat in them,  Rest, if you feel tired,  Can Campylobacter infection be prevented? - To reduce your chance of getting (or spreading) a Campylobacter infection and other types of food poisoning:  Wash your hands after changing diapers, going to the bathroom, blowing your nose, touching animals, or taking out the trash.  Stay home from work or school until you feel better (if you are sick).  Pay attention to food safety. Tips include:  Do not drink unpasteurized milk or foods made with it.  Wash fruits and vegetables well before eating them.  Keep the refrigerator colder than 40??F (4.4??C) and the freezer below 0??F (-18??C).  Cook meat and seafood until well done.  Cook eggs until the yolk is firm.  Always wash your hands, knives, and cutting boards after they touch raw food.            Appointments which have been scheduled for you      Apr 19, 2024 11:30 AM  (Arrive by 11:15 AM)  RETURN IBD with Anette Arlean Pike, APRN  [facility] GI [facility]IC[facility] [place] [facility]) [address]  [facility] 1 through 4  [place] [address]  [phone]             ______________________________________________________________________  Discharge Day Services:  BP 105/68  - Pulse 91  - Temp 36.6 ??C (97.9 ??F) (Oral)  - Resp 18  - Ht 172.7 cm (5' 8)  - Wt 71.7 kg (158 lb)  - SpO2 100%  - BMI 24.02 kg/m??   Pt seen on the day of discharge and determined appropriate for discharge.    Condition at Discharge: good    Length of Discharge: I spent greater than 30 mins in the discharge of this patient.

## 2024-04-09 MED ORDER — PANTOPRAZOLE 40 MG TABLET,DELAYED RELEASE
ORAL_TABLET | Freq: Every day | ORAL | 0 refills | 90.00000 days | Status: CP
Start: 2024-04-09 — End: 2024-04-08
  Filled 2024-04-08: qty 34, 34d supply, fill #0

## 2024-04-09 MED ORDER — PREDNISONE 10 MG TABLET
ORAL_TABLET | ORAL | 0 refills | 5.00000 days | Status: CP
Start: 2024-04-09 — End: 2024-04-14
  Filled 2023-12-02: qty 100, 34d supply, fill #0
  Filled 2023-12-31: qty 94, 33d supply, fill #0

## 2024-04-09 MED ORDER — AZITHROMYCIN 500 MG TABLET
ORAL_TABLET | ORAL | 0 refills | 2.00000 days | Status: CP
Start: 2024-04-09 — End: 2024-04-11
  Filled 2024-04-08: qty 2, 2d supply, fill #0

## 2024-04-10 DIAGNOSIS — D500 Iron deficiency anemia secondary to blood loss (chronic): Principal | ICD-10-CM

## 2024-04-18 NOTE — Unmapped (Unsigned)
 [place] [facility]  CONSULT NOTE - INFLAMMATORY BOWEL DISEASE  04/18/2024    Demographics:  Jerome Kennedy. is a 48 y.o. year old male    Referring physician:   [place] Anette Dragon, APRN  [address]  [facility] 1-4  [facility] Dept Medicine  [place],  [place] [address]          HPI / NOTE :     Today, I saw [name]. for follow up consultation in the [facility] regarding Crohn's Disease.  Outside records including available clinical notes, endoscopy reports, imaging results and pathology results were reviewed in detail as part of this follow up visit.     HPI:  [name] is a 47 y.o. 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    Since last seen (01/2024), he reports    From a GI perspective, he notes      Review of Systems: positive for the above   Otherwise, the balance of 10 systems is negative.          IBD HISTORY:     Year of disease onset:  1990's  Diagnosis:  Crohn's Disease  Age at onset:   3-40 yr old (A2)  Location:  Ileocolonic (L3)  Behavior:  Stricturing (B2)  Perianal Dz:  No    Brief IBD Disease Course:      2003 ileocecectomy  2004 ventral abdominal hernia and incisional hernia repair   04/2022 hospital admission for C Diff infection  06/10/22 initial visit, continue humira , check fCal   07/2022 inpatient admission for outpatient labs showing hypocalcemia, CT with long segment circumferential bowel wall thickening and hyperenhancement of the neoterminal ileum extending through the anastomosis of the proximal colon which was increased from prior CT scan along with luminal narrowing of this long segment of terminal ileum with focal proximal dilation concerning for stricture. Continue weekly humira  (had been increased 1 week prior to hospital admission).   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 Advised no steroids and continue Humira  on discharge  11/2022 multiple hospitalizations for abd pain, left AMA on 4/14 and again left AMA on 4/20. Re-admitted 4/23 abd underwent colonoscopy 11/30/23 which showed ongoing ileal disease in the 10 cm of neo-TI examined, Rutgeerts i3, colon otherwise normal. Advised to switch to Rinvoq  however he did not start therapy until over a month later despite insurance approval (non-compliance). Saw the Rinvoq  tablets in his stool so plan was to switch to Skyrizi however a peripheral IV was unable to be placed during his first infusion despite 5 staff members attempting IV placement so aborted. Switch to Tremfya .     Endoscopy:        04/2024 Colonoscopy  Impression: - Preparation of the colon was poor.                          - Hemorrhoids found on perianal exam.                          -  Crohn's disease with ileitis. Rutgeerts Score i3- i4.                          - Normal mucosa in the entire examined colon.                          - Dilated in the rectum, in the sigmoid colon, in the descending colon   and in the transverse colon.                          - The examination was otherwise normal on direct and retroflexion   views.                          - Stool in the entire examined colon.                         PATH     11/2023 Colonoscopy  Impression: - Preparation of the colon was inadequate.                          - Stool in the entire examined colon.                          - Patent end-to-side ileo-colonic anastomosis, characterized by only   very mild narrowing. - Simple Endoscopic Score for Crohn's Disease: 7, mucosal   inflammatory changes secondary to Crohn's disease with ileitis.   Rutgeerts i3. No colonic involvement on today's exam.    09/2022 Colonoscopy  Impression: - Stool in the entire examined colon.                          - Simple Endoscopic Score for Crohn's Disease: 0, mucosal   inflammatory changes secondary to Crohn's disease, in remission.    10/2019 Colonoscopy  Impression: - Preparation of the colon was inadequate. Unable to advance   scope (adult gastroscope) beyond 40 cm due to restricted colonic   mobility/angulations suspected 2/2 adhesions.                          - Mild diversion colitis in the rectum, otherwise normal colon to max   extent.                          - Internal hemorrhoids.                          - The examination was otherwise normal on direct and retroflexion   views.    03/2015 Colonoscopy  Impression: - Normal mucosa in the rectum, in the sigmoid colon, in the   descending colon, in the transverse colon and in the ascending   colon.                      - Non-patent surgical anastomosis, characterized by congestion,   friable mucosa, ulceration and severe stenosis. Rutgeerts i4.                      - Internal hemorrhoids.    01/2013 Colonoscopy  Impression: - Preparation of  the colon was poor.                      - Non-patent surgical anastomosis, with severe stenosis and   ulceration. This could not be traversed. Is consistent with   Rutgeerts I4.    09/2004 Colonoscopy  Impression: - The colon is normal.                    - Ileo-colonic anastomosis in the proximal ascending colon.                    - Multiple ulcers in the terminal ileum consistent with Ileal Crohn's   disease. No colonic Crohn's disease seen.     Imaging:      Prior IBD medications (type, dose, duration, response):    Remicade - failed          Past Medical History:   Past medical history: Past Medical History[1]  Past surgical history: Past Surgical History[2]  Family history: Family History[3]  Social history: Social History[4]          Allergies:   Allergies[5]          Medications:   Current Medications[6]          Physical Exam:   There were no vitals taken for this visit.    GEN: no apparent distress, appears comfortable on exam  HEENT: PEERL, OP clear with no erythema, lesions, exudate, mucous membranes moist  NEURO:  gait normal, non-focal, no obvious neurologic abnormality  NECK: Supple, no lymphadenopathy  LUNGS: CTAB, no wheezes, rales, or rhonchi  CV: S1/S2, RRR, no murmurs  ABD: Soft, nontender, nondistended, normoactive bowel sounds, no rebound/guarding, no appreciable organomegaly  Extremities: no cyanosis, clubbing or edema, normal gait  Psych: affect appropriate, A&O x3  SKIN: no visible lesions on face, neck, arms, abdomen  PERIANAL / RECTAL EXAM:   Deferred           Labs, Data & Indices:     Lab Review:   Lab Results   Component Value Date    WBC 7.9 04/08/2024    WBC 8.1 01/06/2015    WBC 8.1 01/06/2015    RBC 4.01 (L) 04/08/2024    RBC 4.96 01/06/2015    HGB 12.0 (L) 04/08/2024    Hemoglobin 11.5 (L) 07/20/2015     Lab Results   Component Value Date    AST 12 04/08/2024    AST 21 01/05/2013    ALT 8 (L) 04/08/2024    ALT 28 01/24/2013    BUN <5 (L) 04/08/2024    BUN 12 03/23/2013    Creatinine 0.84 04/08/2024    Creatinine 1.05 01/06/2015    CO2 20.0 04/08/2024    CO2 23.6 01/06/2015    Albumin 3.0 (L) 04/08/2024    Albumin 3.2 (L) 01/06/2015    Calcium  8.0 (L) 04/08/2024    Calcium  8.4 01/06/2015     No results found for: TSH     ............................................................................................................................................SABRA          Assessment & Recommendations:     Cuahutemoc Attar. is a 48 y.o. male who presents for ***.    PLAN:        --------------------------------------------  [facility] Gastroenterology & [facility]atology  Multidisciplinary Inflammatory Bowel Disease Clinic [1]   Past Medical History:  Diagnosis Date    Anemia 04/05/2022    Anxiety     Avascular necrosis of femur head, right    (  CMS-HCC) 2011    Bipolar disorder    (CMS-HCC) 2013    Follows the Psychiatry    Cannabis use disorder     COPD (chronic obstructive pulmonary disease)    (CMS-HCC) 2022    Crohn's disease    (CMS-HCC) 1990    Depression 2007    Severe depressive episode with psychotic symptoms    DVT (deep venous thrombosis)    (CMS-HCC) 02/08/2010    DVT of superior vena cava, left brachiocephalic, 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    Myalgia     Rhinovirus infection 10/29/2022    SBO (small bowel obstruction)    (CMS-HCC) 2011   [2]   Past Surgical History:  Procedure Laterality Date    CHOLECYSTECTOMY      COLON SURGERY      partial resection    HERNIA REPAIR      PR ARTHRP INTERCARPAL/CARP/MTCRPL JT INTERPOSITION Left 08/07/2022    Procedure: INTERPOSIT ARTHROPLASTY-INTERCARPAL/CARPOMETACAR;  Surgeon: Bonifacio Cordella HERO, MD;  Location: MAIN OR [facility];  Service: Orthopedics    PR CLOSE ENTEROSTOMY,RESEC+COLOREC ANAS Midline 11/01/2019    Procedure: CLO ENTEROSTOMY; W/RESECT AUGIE FLAKES;  Surgeon: Aloysius Euell Quale, MD;  Location: MAIN OR [place];  Service: Gastrointestinal    PR COLONOSCOPY FLX DX W/COLLJ SPEC WHEN PFRMD Left 01/07/2013    Procedure: COLONOSCOPY, FLEXIBLE, PROXIMAL TO SPLENIC FLEXURE; DIAGNOSTIC, W/WO COLLECTION SPECIMEN BY BRUSH OR WASH;  Surgeon: Liliane JENEANE Marker, MD;  Location: GI PROCEDURES [facility];  Service: Gastroenterology    PR COLONOSCOPY FLX DX W/COLLJ SPEC WHEN PFRMD  03/09/2015    Procedure: COLONOSCOPY, FLEXIBLE, PROXIMAL TO SPLENIC FLEXURE; DIAGNOSTIC, W/WO COLLECTION SPECIMEN BY BRUSH OR WASH;  Surgeon: Artist Jayson Leather, MD;  Location: GI PROCEDURES [facility];  Service: Gastroenterology    PR COLONOSCOPY FLX DX W/COLLJ SPEC WHEN PFRMD N/A 10/28/2019    Procedure: COLONOSCOPY, FLEXIBLE, PROXIMAL TO SPLENIC FLEXURE; DIAGNOSTIC, W/WO COLLECTION SPECIMEN BY BRUSH OR WASH;  Surgeon: Derick Bring, MD;  [facility]: GI PROCEDURES [facility];  Service: Gastroenterology    PR COLONOSCOPY FLX DX W/COLLJ SPEC WHEN PFRMD N/A 10/01/2022    Procedure: COLONOSCOPY, FLEXIBLE, PROXIMAL TO SPLENIC FLEXURE; DIAGNOSTIC, W/WO COLLECTION SPECIMEN BY BRUSH OR WASH;  Surgeon: Darrick Peck, MD;  Location: GI PROCEDURES [facility];  Service: Gastroenterology    PR COLONOSCOPY FLX DX W/COLLJ SPEC WHEN PFRMD N/A 11/30/2023    Procedure: COLONOSCOPY, FLEXIBLE, PROXIMAL TO SPLENIC FLEXURE; DIAGNOSTIC, W/WO COLLECTION SPECIMEN BY BRUSH OR WASH;  Surgeon: Audrey Lacinda Hun, MD;  Location: GI PROCEDURES [facility];  Service: Gastroenterology    PR COLONOSCOPY W/BIOPSY SINGLE/MULTIPLE N/A 04/08/2024    Procedure: COLONOSCOPY, FLEXIBLE, PROXIMAL TO SPLENIC FLEXURE; WITH BIOPSY, SINGLE OR MULTIPLE;  Surgeon: Celestina Comer Norris, MD;  Location: GI PROCEDURES [facility];  Service: Gastroenterology    PR EXPLORATORY OF ABDOMEN Midline 09/02/2019    Procedure: EXPLORATORY LAPAROTOMY, EXPLORATORY CELIOTOMY WITH OR WITHOUT BIOPSY(S);  Surgeon: Aloysius Euell Quale, MD;  Location: MAIN OR [facility];  Service: Gastrointestinal    PR EXPLORATORY OF ABDOMEN Midline 11/01/2019    Procedure: EXPLORATORY LAPAROTOMY, EXPLORATORY CELIOTOMY WITH OR WITHOUT BIOPSY(S);  Surgeon: Aloysius Euell Quale, MD;  Location: MAIN OR [facility];  Service: Gastrointestinal    PR FIX FINGER,VOLAR PLATE,I-P JT Left 08/07/2022    Procedure: REPAIR AND RECONSTRUCTION, FINGER, VOLAR PLATE, INTERPHALANGEAL JOINT;  Surgeon: Bonifacio Cordella HERO, MD;  Location: MAIN OR [facility];  Service: Orthopedics    PR FREEING BOWEL ADHESION,ENTEROLYSIS N/A 11/01/2019    Procedure: Enterolysis (Separt Proc);  Surgeon:  Aloysius Euell Quale, MD;  Location: MAIN OR [facility];  Service: Gastrointestinal    PR FUSION MC-P JT Left 08/27/2023    Procedure: ARTHRODESIS, METACARPOPHALANGEAL JOINT, WITH OR WITHOUT INTERNAL FIXATION;  Surgeon: Bonifacio Cordella HERO, MD;  Location: OR [facility];  Service: Orthopedics    PR ILEOSCOPY THRU STOMA,BIOPSY N/A 10/14/2019    Procedure: LYNETT; LORN 1/MX;  Surgeon: Thedora Alm Plain, MD;  Location: GI PROCEDURES [facility];  Service: Gastroenterology    PR PART REMOVAL COLON W COLOSTOMY Midline 09/02/2019    Procedure: COLECTOMY, PARTIAL; WITH SKIN LEVEL CECOSTOMY OR COLOSTOMY;  Surgeon: Aloysius Euell Quale, MD;  Location: MAIN OR [facility];  Service: Gastrointestinal    PR REPAIR INCISIONAL HERNIA,REDUCIBLE Midline 09/02/2019    Procedure: REPAIR INIT INCISIONAL OR VENTRAL HERNIA; REDUCIBLE;  Surgeon: Arnette Darice Burnet, MD;  Location: MAIN OR [place];  Service: Trauma    PR UPPER GI ENDOSCOPY,BIOPSY N/A 01/07/2013    Procedure: UGI ENDOSCOPY; WITH BIOPSY, SINGLE OR MULTIPLE;  Surgeon: Liliane JENEANE Marker, MD;  Location: GI PROCEDURES [facility];  Service: Gastroenterology    PR UPPER GI ENDOSCOPY,BIOPSY N/A 10/14/2019    Procedure: UGI ENDOSCOPY; WITH BIOPSY, SINGLE OR MULTIPLE;  Surgeon: Thedora Alm Plain, MD;  Location: GI PROC[facility]URES [facility];  Service: Gastroenterology    PR UPPER GI ENDOSCOPY,DIAGNOSIS N/A 10/01/2022    Procedure: UGI ENDO, INCLUDE ESOPHAGUS, STOMACH, & DUODENUM &/OR JEJUNUM; DX W/WO COLLECTION SPECIMN, BY BRUSH OR WASH;  Surgeon: Darrick Peck, MD;  Location: GI PROCEDURES [facility];  Service: Gastroenterology   [3]   Family History  Problem Relation Age of Onset    Breast cancer Mother     Diabetes Maternal Aunt     Colon cancer Maternal Grandmother     Prostate cancer Maternal Grandfather     Cancer Paternal Grandmother     Crohn's disease Neg Hx    [4]   Social History  Socioeconomic History    Marital status: Divorced    Number of children: 1    Highest education level: GED or equivalent   Tobacco Use    Smoking status: Every Day     Current packs/day: 1.00     Average packs/day: 0.6 packs/day for 22.5 years (13.0 ttl pk-yrs)     Types: Cigarettes     Start date: 10/02/2001     Passive exposure: Current    Smokeless tobacco: Never Tobacco comments:     1ppd; down to 2cpd since March 2025   Vaping Use    Vaping status: Some Days    Substances: Nicotine     Passive vaping exposure: Yes   Substance and Sexual Activity    Alcohol use: No     Alcohol/week: 0.0 standard drinks of alcohol    Drug use: Yes     Types: Marijuana     Comment: Smokes 2-3 joints/day    Sexual activity: Not Currently     Partners: Female     Birth control/protection: Condom   Social History Narrative    Lives by himself in [place]. Not working.       Social Drivers of Psychologist, prison and probation services Strain: Low Risk  (12/29/2023)    Overall Financial Resource Strain (CARDIA)     Difficulty of Paying Living Expenses: Not hard at all   Food Insecurity: No Food Insecurity (12/29/2023)    Hunger Vital Sign     Worried About Running Out of Food in the Last Year: Never true  Ran Out of Food in the Last Year: Never true   Transportation Needs: No Transportation Needs (12/29/2023)    PRAPARE - Therapist, art (Medical): No     Lack of Transportation (Non-Medical): No   Physical Activity: Not on File (06/21/2021)    Received from [facility]    Physical Activity     Physical Activity: 0   Stress: Not on File (06/21/2021)    Received from [facility]    Stress     Stress: 0   Social Connections: Not on File (04/18/2023)    Received from [employer] [facility]    Social Connections     Connectedness: 0   Housing: Low Risk  (12/29/2023)    Housing     Within the past 12 months, have you ever stayed: outside, in a car, in a tent, in an overnight shelter, or temporarily in someone else's home (i.e. couch-surfing)?: No     Are you worried about losing your housing?: No   [5]   Allergies  Allergen Reactions    Morphine  Itching and Rash     He gets light headed    Toradol [Ketorolac] Itching   [6]   Current Outpatient Medications   Medication Sig Dispense Refill    empty container Misc Use as directed 1 each 1    ergocalciferol-1,250 mcg, 50,000 unit, (DRISDOL) 1,250 mcg (50,000 unit) capsule Take 1 capsule (1,250 mcg total) by mouth once a week.      ferrous sulfate  325 (65 FE) MG tablet Take 1 tablet (325 mg total) by mouth in the morning.      FLONASE  ALLERGY RELIEF 50 mcg/actuation nasal spray 2 sprays into each nostril daily as needed.      guselkumab  (TREMFYA  PEN) 200 mg/2 mL PnIj Inject the contents of 1 pen (200 mg) under the skin every twenty-eight (28) days. 2 mL 5    nicotine  (NICODERM CQ ) 14 mg/24 hr patch Place 1 patch on the skin daily. 28 patch 1    nicotine  polacrilex (NICORETTE MINI) lozenge 4 MG Apply 1 lozenge (4 mg total) to cheek every hour as needed for smoking cessation. 72 each 0    nicotine  polacrilex (NICORETTE) 2 mg gum Apply 1 each (2 mg total) to cheek every hour as needed for smoking cessation. 110 each 0    ondansetron  (ZOFRAN -ODT) 4 MG disintegrating tablet Take 1 tablet (4 mg total) by mouth every eight (8) hours as needed for nausea for up to 7 days. 21 tablet 0    OPTICHAMBER DIAMOND VHC Spcr       pantoprazole  (PROTONIX ) 40 MG tablet Take 1 tablet (40 mg total) by mouth daily. 90 tablet 2    QUEtiapine  (SEROQUEL ) 100 MG tablet Take 1 tablet (100 mg total) by mouth nightly.      SYMBICORT  80-4.5 mcg/actuation inhaler Inhale 2 puffs  in the morning. 10.2 g 0    VENTOLIN  HFA 90 mcg/actuation inhaler Inhale 2 puffs every six (6) hours as needed. 18 g 0     No current facility-administered medications for this visit.

## 2024-04-19 ENCOUNTER — Ambulatory Visit: Admit: 2024-04-19 | Payer: Medicaid (Managed Care)

## 2024-04-19 NOTE — Unmapped (Signed)
 It was unfortunate that patient did not present for their scheduled appt today. I have done ~ 1 hour of chart review listed below.        Year of disease onset:  1990's  Diagnosis:  Crohn's Disease  Age at onset:   26-48 yr old (A2)  Location:  Ileocolonic (L3)  Behavior:  Stricturing (B2)  Perianal Dz:  No    Brief IBD Disease Course:      2003 ileocecectomy  2004 ventral abdominal hernia and incisional hernia repair   04/2022 hospital admission for C Diff infection  06/10/22 initial visit, continue humira , check fCal   07/2022 inpatient admission for outpatient labs showing hypocalcemia, CT with long segment circumferential bowel wall thickening and hyperenhancement of the neoterminal ileum extending through the anastomosis of the proximal colon which was increased from prior CT scan along with luminal narrowing of this long segment of terminal ileum with focal proximal dilation concerning for stricture. Continue weekly humira  (had been increased 1 week prior to hospital admission).   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 Advised no steroids and continue Humira  on discharge  11/2022 multiple hospitalizations for abd pain, left AMA on 4/14 and again left AMA on 4/20. Re-admitted 4/23 abd underwent colonoscopy 11/30/23 which showed ongoing ileal disease in the 10 cm of neo-TI examined, Rutgeerts i3, colon otherwise normal. Advised to switch to Rinvoq  however he did not start therapy until over a month later despite insurance approval (non-compliance). Saw the Rinvoq  tablets in his stool so plan was to switch to Skyrizi however a peripheral IV was unable to be placed during his first infusion despite 5 staff members attempting IV placement so aborted. Switch to Tremfya .     Endoscopy:        04/2024 Colonoscopy  Impression: - Preparation of the colon was poor.                          - Hemorrhoids found on perianal exam.                          - Crohn's disease with ileitis. Rutgeerts Score i3- i4.                          - Normal mucosa in the entire examined colon.                          - Dilated in the rectum, in the sigmoid colon, in the descending colon   and in the transverse colon.                          - The examination was otherwise normal on direct and retroflexion   views.                          - Stool in the entire examined colon.                         PATH     11/2023 Colonoscopy  Impression: - Preparation of  the colon was inadequate.                          - Stool in the entire examined colon.                          - Patent end-to-side ileo-colonic anastomosis, characterized by only   very mild narrowing.                          - Simple Endoscopic Score for Crohn's Disease: 7, mucosal   inflammatory changes secondary to Crohn's disease with ileitis.   Rutgeerts i3. No colonic involvement on today's exam.    09/2022 Colonoscopy  Impression: - Stool in the entire examined colon.                          - Simple Endoscopic Score for Crohn's Disease: 0, mucosal   inflammatory changes secondary to Crohn's disease, in remission.    10/2019 Colonoscopy  Impression: - Preparation of the colon was inadequate. Unable to advance   scope (adult gastroscope) beyond 40 cm due to restricted colonic   mobility/angulations suspected 2/2 adhesions.                          - Mild diversion colitis in the rectum, otherwise normal colon to max   extent.                          - Internal hemorrhoids.                          - The examination was otherwise normal on direct and retroflexion   views.    03/2015 Colonoscopy  Impression: - Normal mucosa in the rectum, in the sigmoid colon, in the   descending colon, in the transverse colon and in the ascending   colon.                      - Non-patent surgical anastomosis, characterized by congestion,   friable mucosa, ulceration and severe stenosis. Rutgeerts i4.                      - Internal hemorrhoids.    01/2013 Colonoscopy  Impression: - Preparation of the colon was poor.                      - Non-patent surgical anastomosis, with severe stenosis and   ulceration. This could not be traversed. Is consistent with   Rutgeerts I4.    09/2004 Colonoscopy  Impression: - The colon is normal.                    - Ileo-colonic anastomosis in the proximal ascending colon.                    - Multiple ulcers in the terminal ileum consistent with Ileal Crohn's   disease. No colonic Crohn's disease seen.     Imaging:      Prior IBD medications (type, dose, duration, response):    Remicade - failed

## 2024-04-25 MED ORDER — DOXYCYCLINE HYCLATE 100 MG CAPSULE
ORAL_CAPSULE | Freq: Two times a day (BID) | ORAL | 0 refills | 14.00000 days
Start: 2024-04-25 — End: ?

## 2024-04-25 MED FILL — DOXYCYCLINE HYCLATE 100 MG CAPSULE: ORAL | 14 days supply | Qty: 28 | Fill #0

## 2024-04-29 NOTE — Unmapped (Signed)
 [facility] Refill Coordination Note    Specialty Medication(s) to be Shipped:   Inflammatory Disorders: TREMFYA  PEN 200 mg/2 mL Pnij (guselkumab )    Other medication(s) to be shipped: No additional medications requested for fill at this time    Specialty Medications not needed at this time: N/A     Jerome Kennedy., DOB: [birth year]  Phone: [phone] (home)       All above HIPAA information was verified with patient.     Was a Nurse, learning disability used for this call? No    Completed refill call assessment today to schedule patient's medication shipment from the [facility]  [phone]).  All relevant notes have been reviewed.     Specialty medication(s) and dose(s) confirmed: Regimen is correct and unchanged.   Changes to medications: [place] reports no changes at this time.  Changes to insurance: No  New side effects reported not previously addressed with a pharmacist or physician: None reported  Questions for the pharmacist: No    Confirmed patient received a Conservation officer, historic buildings and a Surveyor, mining with first shipment. The patient will receive a drug information handout for each medication shipped and additional FDA Medication Guides as required.       DISEASE/MEDICATION-SPECIFIC INFORMATION        For patients on injectable medications: Next injection is scheduled for 10/1.    SPECIALTY MEDICATION ADHERENCE     Medication Adherence    Patient reported X missed doses in the last month: 0  Specialty Medication: TREMFYA  PEN 200 mg/2 mL  Patient is on additional specialty medications: No  Informant: patient              Were doses missed due to medication being on hold? No    TREMFYA  PEN 200 mg/2 mL Pnij (guselkumab ): 0 doses of medicine on hand         REFERRAL TO PHARMACIST     Referral to the pharmacist: Not needed      [facility]     Shipping address confirmed in Epic.     Cost and Payment: Patient has a copay of $4. They are aware and have authorized the pharmacy to charge the credit card on file.    Delivery Scheduled: Yes, Expected medication delivery date: 9/29.     Medication will be delivered via Same Day Courier to the prescription address in Epic WAM.    Jerome Kennedy [facility]

## 2024-05-02 MED FILL — TREMFYA PEN 200 MG/2 ML SUBCUTANEOUS PEN INJECTOR: SUBCUTANEOUS | 28 days supply | Qty: 2 | Fill #1

## 2024-05-08 DIAGNOSIS — D500 Iron deficiency anemia secondary to blood loss (chronic): Principal | ICD-10-CM

## 2024-05-09 MED ORDER — LEVOFLOXACIN 500 MG TABLET
ORAL_TABLET | Freq: Every day | ORAL | 0 refills | 10.00000 days
Start: 2024-05-09 — End: ?

## 2024-05-10 MED FILL — LEVOFLOXACIN 500 MG TABLET: ORAL | 10 days supply | Qty: 10 | Fill #0

## 2024-05-14 ENCOUNTER — Ambulatory Visit
Admission: EM | Admit: 2024-05-14 | Discharge: 2024-05-16 | Disposition: A | Discharge status: Home / Self Care | Payer: Medicaid (Managed Care)

## 2024-05-14 ENCOUNTER — Inpatient Hospital Stay
Admission: EM | Admit: 2024-05-14 | Discharge: 2024-05-16 | Disposition: A | Discharge status: Home / Self Care | Payer: Medicaid (Managed Care)

## 2024-05-14 LAB — COMPREHENSIVE METABOLIC PANEL
ALBUMIN: 2.8 g/dL — ABNORMAL LOW (ref 3.4–5.0)
ALKALINE PHOSPHATASE: 140 U/L — ABNORMAL HIGH (ref 46–116)
ALT (SGPT): 9 U/L — ABNORMAL LOW (ref 10–49)
ANION GAP: 12 mmol/L (ref 5–14)
AST (SGOT): 10 U/L (ref <=34)
BILIRUBIN TOTAL: 0.3 mg/dL (ref 0.3–1.2)
BLOOD UREA NITROGEN: 6 mg/dL — ABNORMAL LOW (ref 9–23)
BUN / CREAT RATIO: 6
CALCIUM: 7.6 mg/dL — ABNORMAL LOW (ref 8.7–10.4)
CHLORIDE: 110 mmol/L — ABNORMAL HIGH (ref 98–107)
CO2: 27 mmol/L (ref 20.0–31.0)
CREATININE: 1.03 mg/dL (ref 0.73–1.18)
EGFR CKD-EPI (2021) MALE: 90 mL/min/1.73m2 (ref >=60)
GLUCOSE RANDOM: 81 mg/dL (ref 70–179)
POTASSIUM: 3 mmol/L — ABNORMAL LOW (ref 3.4–4.8)
PROTEIN TOTAL: 5.9 g/dL (ref 5.7–8.2)
SODIUM: 149 mmol/L — ABNORMAL HIGH (ref 135–145)

## 2024-05-14 LAB — URINALYSIS WITH MICROSCOPY
BACTERIA: NONE SEEN /HPF
BILIRUBIN UA: NEGATIVE
BLOOD UA: NEGATIVE
GLUCOSE UA: NEGATIVE
KETONES UA: NEGATIVE
LEUKOCYTE ESTERASE UA: NEGATIVE
NITRITE UA: NEGATIVE
PH UA: 7 (ref 5.0–9.0)
PROTEIN UA: NEGATIVE
RBC UA: 2 /HPF (ref <=3)
SPECIFIC GRAVITY UA: 1.046 — ABNORMAL HIGH (ref 1.003–1.030)
SQUAMOUS EPITHELIAL: 1 /HPF (ref 0–5)
UROBILINOGEN UA: <2
WBC UA: 1 /HPF (ref <=2)

## 2024-05-14 LAB — TOXICOLOGY SCREEN, URINE
AMPHETAMINE SCREEN URINE: NEGATIVE
BARBITURATE SCREEN URINE: NEGATIVE
BENZODIAZEPINE SCREEN, URINE: NEGATIVE
BUPRENORPHINE, URINE SCREEN: NEGATIVE
CANNABINOID SCREEN URINE: POSITIVE — AB
COCAINE(METAB.)SCREEN, URINE: NEGATIVE
FENTANYL SCREEN, URINE: POSITIVE — AB
METHADONE SCREEN, URINE: NEGATIVE
OPIATE SCREEN URINE: POSITIVE — AB
OXYCODONE SCREEN URINE: NEGATIVE

## 2024-05-14 LAB — CBC W/ AUTO DIFF
BASOPHILS ABSOLUTE COUNT: 0 10*9/L (ref 0.0–0.1)
BASOPHILS RELATIVE PERCENT: 0.5 %
EOSINOPHILS ABSOLUTE COUNT: 0.1 10*9/L (ref 0.0–0.5)
EOSINOPHILS RELATIVE PERCENT: 1.8 %
HEMATOCRIT: 36.9 % — ABNORMAL LOW (ref 39.0–48.0)
HEMOGLOBIN: 12.1 g/dL — ABNORMAL LOW (ref 12.9–16.5)
LYMPHOCYTES ABSOLUTE COUNT: 1.2 10*9/L (ref 1.1–3.6)
LYMPHOCYTES RELATIVE PERCENT: 17.5 %
MEAN CORPUSCULAR HEMOGLOBIN CONC: 32.9 g/dL (ref 32.0–36.0)
MEAN CORPUSCULAR HEMOGLOBIN: 29.3 pg (ref 25.9–32.4)
MEAN CORPUSCULAR VOLUME: 89 fL (ref 77.6–95.7)
MEAN PLATELET VOLUME: 8.4 fL (ref 6.8–10.7)
MONOCYTES ABSOLUTE COUNT: 0.6 10*9/L (ref 0.3–0.8)
MONOCYTES RELATIVE PERCENT: 8.5 %
NEUTROPHILS ABSOLUTE COUNT: 4.9 10*9/L (ref 1.8–7.8)
NEUTROPHILS RELATIVE PERCENT: 71.7 %
PLATELET COUNT: 174 10*9/L (ref 150–450)
RED BLOOD CELL COUNT: 4.15 10*12/L — ABNORMAL LOW (ref 4.26–5.60)
RED CELL DISTRIBUTION WIDTH: 14.6 % (ref 12.2–15.2)
WBC ADJUSTED: 6.8 10*9/L (ref 3.6–11.2)

## 2024-05-14 LAB — LIPASE: LIPASE: 75 U/L — ABNORMAL HIGH (ref 12–53)

## 2024-05-14 LAB — MAGNESIUM: MAGNESIUM: 1.5 mg/dL — ABNORMAL LOW (ref 1.6–2.6)

## 2024-05-14 LAB — C-REACTIVE PROTEIN: C-REACTIVE PROTEIN: 53.1 mg/L — ABNORMAL HIGH (ref <=10.0)

## 2024-05-14 LAB — SEDIMENTATION RATE: ERYTHROCYTE SEDIMENTATION RATE: 14 mm/h (ref 0–15)

## 2024-05-14 MED ADMIN — potassium chloride 10 mEq in 100 mL IVPB: 10 meq | INTRAVENOUS | @ 20:00:00 | Stop: 2024-05-14

## 2024-05-14 MED ADMIN — HYDROmorphone (PF) (DILAUDID) injection 1 mg: 1 mg | INTRAVENOUS | @ 22:00:00 | Stop: 2024-05-14

## 2024-05-14 MED ADMIN — vancomycin (VANCOCIN) 1500 mg in dextrose 5 % 300 mL IVPB (premix): 1500 mg | INTRAVENOUS | @ 19:00:00 | Stop: 2024-05-14

## 2024-05-14 MED ADMIN — potassium chloride 10 mEq in 100 mL IVPB: 10 meq | INTRAVENOUS | @ 19:00:00 | Stop: 2024-05-14

## 2024-05-14 MED ADMIN — lactated ringers bolus 1,000 mL: 1000 mL | INTRAVENOUS | @ 17:00:00 | Stop: 2024-05-14

## 2024-05-14 MED ADMIN — HYDROmorphone (PF) (DILAUDID) injection 1 mg: 1 mg | INTRAVENOUS | @ 17:00:00 | Stop: 2024-05-14

## 2024-05-14 MED ADMIN — hydrOXYzine (ATARAX) tablet 25 mg: 25 mg | ORAL | @ 17:00:00

## 2024-05-14 MED ADMIN — HYDROmorphone (PF) (DILAUDID) injection 1 mg: 1 mg | INTRAVENOUS | @ 19:00:00 | Stop: 2024-05-15

## 2024-05-14 MED ADMIN — potassium chloride 10 mEq in 100 mL IVPB: 10 meq | INTRAVENOUS | @ 22:00:00 | Stop: 2024-05-14

## 2024-05-14 MED ADMIN — potassium chloride 10 mEq in 100 mL IVPB: 10 meq | INTRAVENOUS | @ 23:00:00 | Stop: 2024-05-14

## 2024-05-14 MED ADMIN — piperacillin-tazobactam (ZOSYN) IVPB (premix) 4.5 g: 4.5 g | INTRAVENOUS | @ 18:00:00 | Stop: 2024-05-14

## 2024-05-14 MED ADMIN — iohexol (OMNIPAQUE) 350 mg iodine/mL solution 100 mL: 100 mL | INTRAVENOUS | @ 19:00:00 | Stop: 2024-05-14

## 2024-05-14 MED ADMIN — prochlorperazine (COMPAZINE) injection 5 mg: 5 mg | INTRAVENOUS | @ 17:00:00 | Stop: 2024-05-14

## 2024-05-14 NOTE — Unmapped (Signed)
Bed: 17-B  Expected date:   Expected time:   Means of arrival:   Comments:  ems

## 2024-05-14 NOTE — Unmapped (Signed)
 Pt BIB [facility] for RLQ pain that started an hour ago, hx Crohn's  and R ankle pain/ possible wound infection.

## 2024-05-14 NOTE — Unmapped (Signed)
 [facility] Medicine   History and Physical       Assessment and Plan     Jerome Kennedy. is a 48 y.o. male who is presenting to [facility] with stricturing ileocolonic Crohn's disease, supposed copd (not confirmed by pfts), bipolar 1 disorder, prior DVT not on AC at home, tobacco use disorder, and cocaine/marijuana use who presented to the hospital with a couple of issues.  His main complaint was RLE pain, redness, and swelling.       Pain of right lower extremity   Illness that poses a threat to life or bodily function without appropriate treatment.  Based on appearance of the erythema and marked tenderness with just light palpation and limited joint ROM, I suspect the patient may have gout.  He is not on diuretics and says he does not drink beer.  Other risk factors may include a diet high in red meat.  Got a dose of vanc/zosyn  in the ED but overall suspicion for cellulitis is low.  Given he does have findings of new areas of inflammation with regards to Crohn's disease on imaging, ordered methylprednisilone 20 mg IV x1 as that would cover a Crohn's flare and gout. Consider colchicine upon discharge though diarrhea can limit use in which case can consider allopurinol once acute flare resolves.  Can transition to po steroids tomorrow with quick taper until GI follow-up outpatient if his pain and swelling improve.      Crohn's disease with possible flare:  not having a lot of diarrhea and hgb is stable.  Imaging overall stable it appears but with a new area of inflammation around the neoterminal ileum.  IV methylpred x1.  If he has watery stools, can collect a c. Diff though suspicion is lower.  Consult GI or get recs for dc.  Would just do a po prednisone  taper until he can follow-up with GI.  This will also treat possible gout. Appointment rescheduled for 05/27/2024.  He missed his 04/19/2024.  He denies any transportation issues.  Says he can access a bus.  Denied financial difficulties to me but chart review has noted this has been a problem.  Says he has an apartment for the past 2 years (previously homeless).  Ultimately, needs to start biologics (has failed numerous agents but suspect noncompliance to be the issue rather than true failure).  Scheduled to be on Tremfaya.      Cannabinoid hyperemesis syndrome:  has smoked marijuana for quite some time and gets relief of symptoms with hot showers.  Can do zofran  for now and if not working, can do compazine  but needs qtc checked as he is also on seroquel .  This is the cause of his hypokalemia.  That has been repleted.  Given IV magnesium  as well.  Counseled on marijuana cessation .     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  He is not on anticoagulation outpatient though can strongly consider it so long as his hgb is stable as Crohn's disease is very prothrombotic.  For now, have ordered DVT PPX with lovenox .      Bipolar 1 disorder:  continue seroquel .      Copd - supposed diagnosis.  Don't see imaging findings c/w this nor any pfts in our system.  Is on symbicort  at home and have started equivalent dosing of breo ellipta  here due to formulary availability.      Prophylaxis  -lovenox   Diet  -Nutrition Therapy Regular/House    Code Status / HCDM  -Full Code, Discussed with patient at the time of admission   -  HCDM (patient stated preference): Jerome Kennedy - Brother - [phone]    Anticipated Medically Ready for Discharge: Anticipated Tomorrow        HPI      Jerome Kennedy. is a 48 y.o. male who is presenting to [facility] with stricturing ileocolonic Crohn's disease, supposed copd (not confirmed by pfts), bipolar 1 disorder, prior DVT not on AC at home, tobacco use disorder, and cocaine/marijuana use who presented to the hospital with a couple of issues.  His main complaint was RLE pain, redness, and swelling.  The patient states this started about a week ago and he was prescribed levaquin but this did not help him.  The patient has two doses of levaquin remaining.  He has had a difficult time ambulating and at baseline, has no issues with walking or ADLs.  However, currently, having trouble bearing weight and says light touch to his ankle and lateral shin area cause excruciating pain . He denies fever, chills, chest pain, or shortness of breath.  At baseline he says he has about 2 watery bowel movements per day.  This has not changed.  He has had several episodes of vomiting for the last several days.  He has not noticed bright red blood per rectum.  He denies trauma to the area or any known insect bites.  His diet consists of baked chicken, green beans, and red meat.  Denies consuming alcohol or beer.  He was recently admitted to [facility] for a Crohn's flare and at tha ttime he had severe right lower quadrant abdominal pain, nausea, vomiting, and 4-5 episodes of watery diarrhea.  CT abd/pelvis at that time showed increased wall thickening of the rectum and distal sigmoid colon with upstream dilation of the large bowel and distal ileum, concerning for infectious/inflammatory colitis, and stable thickening of the distal ileum compatible with perianastomotic Crohn's disease. Colonoscopy with biopsy was performed on 04/08/24 and he was managed with IV methylprednisolone .  Fecal calprotectin was unable to be obtained prior to discharge.  He was supposed to follow-up with GI 04/19/2024 and he says I just forgot.  Supposed to be on Tremfya  but has not started this and is not on any treatment for Crohn's disease.     ER course was notable for ct a/p with findings consistent with known crohn's disease with possible new disease at the neoterminal ileum proximal to the ileocolonic anastomosis.        Med Rec Confidence   I reviewed the Medication List. The current list is Accurate    Physical Exam   Temp:  [36.9 ??C (98.4 ??F)] 36.9 ??C (98.4 ??F)  Pulse:  [85-100] 85  SpO2 Pulse:  [93-100] 100  Resp:  [14-18] 14  BP: (126)/(86) 126/86  SpO2:  [97 %-100 %] 100 %  There is no height or weight on file to calculate BMI.  General - lying in bed, awake, no apparent distress.  Disheveled appearance   CV - RRR, no murmurs, no JVD  Pulm - CTA b/l   Abd - large midline umbilical hernia which is reducible; RLQ tenderness to palpation but no rebound tenderness   Ext/skin:  RLE:  marked erythema from the ankle going up to the shin; exquisitely tender with very light/superficial touch to the ankle and lateral right shin; limited ROM due to pain with dorsiflexion of the right  foot; has chronic crusting/venous stasis changes of the right ankle as well

## 2024-05-14 NOTE — Unmapped (Signed)
 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 Exam revealed right lower quadrant tenderness and a red, tender right lower extremity lesion with associated chills. No shortness of breath, chest pain, or urinary symptoms were reported. He appeared run down but was hemodynamically stable.    Differential diagnosis includes, but is not limited to:  - Crohn's Disease Exacerbation: Acute right lower quadrant pain, nausea, vomiting, and diarrhea in a patient with known stricturing ileocolonic Crohn's disease off therapy is most consistent with a disease flare.  - Appendicitis: Appendicitis was considered due to acute right lower quadrant pain, but prior imaging and typical Crohn's presentation make this less likely.  - Small Bowel Obstruction (SBO): Small bowel obstruction was considered given the patient's surgical history and hernia, but no clear obstructive symptoms were identified on exam.  - Right Lower Extremity Cellulitis with Possible Deeper Infection (e.g., Osteomyelitis): Worsening right ankle erythema and pain refractory to levofloxacin raises concern for cellulitis with possible deeper infection such as osteomyelitis or deeper space infection.    Crohn's Disease Exacerbation vs. Other Abdominal Pathology (Appendicitis, SBO)  - Ordered CBC, CMP, ESR, CRP, and CT scan with IV contrast  - Administered Dilaudid  for pain management  - Administered Compazine  for nausea  - Administered IV fluids for hydration    Right Lower Extremity Cellulitis, Rule Out Deeper Infection  - Ordered ESR, CRP, and plain film of the right lower extremity  - Administered Zosyn  and Vancomycin  for broad-spectrum antibiotic coverage  - Obtained blood cultures  - Administered IV fluids for hydration        Diagnostic workup as below.     Orders Placed This Encounter   Procedures   ??? Blood Culture #1   ??? Blood Culture #2   ??? CT Abdomen Pelvis W IV Contrast   ??? XR Ankle 3 or More Views Right   ??? CBC w/ Differential   ??? Comprehensive Metabolic Panel   ??? Lipase   ??? Urinalysis with Microscopy (Clean Catch)   ??? Sedimentation rate, manual   ??? C-reactive protein   ??? ED Admit Decision       ED Course as of 05/14/24 1901   Sat May 14, 2024   1239 Having pruritus. Giving hydroxyzine  for itching   1323 CBC w/ Differential(!):    WBC 6.8   RBC 4.15(!)   HGB 12.1(!)   HCT 36.9(!)   MCV 89.0   MCH 29.3   MCHC 32.9   RDW 14.6   MPV 8.4   Platelet 174   Neutrophils % 71.7   Lymphocytes % 17.5   Monocytes % 8.5   Eosinophils % 1.8   Basophils % 0.5   Absolute Neutrophils 4.9   Absolute Lymphocytes 1.2   Absolute Monocytes  0.6   Absolute Eosinophils 0.1   Absolute Basophils  0.0   1335 Sedimentation rate, manual:    Sed Rate 14  1352 Comprehensive Metabolic Panel(!):    Sodium 149(!)   Potassium 3.0(!)   Chloride 110(!)   CO2 27.0   Anion Gap 12   Bun 6(!)   Creatinine 1.03   BUN/Creatinine Ratio 6   eGFR CKD-EPI (2021) Male 90   Glucose 81   Calcium  7.6(!)   Albumin 2.8(!)   Total Protein 5.9   Total Bilirubin 0.3   SGOT (AST) 10   ALT 9(!)   Alkaline Phosphatase 140(!)   1352 C-reactive protein(!):    CRP 53.1(!)   1352 Lipase(!):    Lipase 75(!)   1356 Potassium(!): 3.0  repleting   1443 Endorsing pain, added dilaudid  1mg  q4h PRN   1453 XR Ankle 3 or More Views Right  FINDINGS:  No evidence of acute fracture. Joints are approximated. No focal osseous lesions. No osseous erosions. Mild diffuse soft tissue swelling in lower leg. No tracking soft tissue gas.     IMPRESSION:  No acute fracture. No radiographic evidence of acute osteomyelitis. No tracking soft tissue gas.   1545 CT Abdomen Pelvis W IV Contrast  IMPRESSION:  --Inflammatory changes of the ileum proximal to the ileocolonic anastomosis again noted with decreased associated small bowel dilation, may represent inflammatory/infectious colitis or changes related to a Crohn's flare.     --Similar wall thickening of the distal ileum just proximal to the ileocolic anastomosis compatible with perianastomotic Crohn's disease.     --Increased protrusion of loops of large bowel through the moderate-sized ventral abdominal wall hernia without evidence of strangulation or obstruction.   1631 Paged [facility] for admission   1722 Wants more pain meds, giving another dilaudid     1837 Patient hesitant but agreeable to admission in setting of worsening abdominal pain. Discussed likelihood of Crohn's flare due to imaging and elevated inflammatory markers. Requiring dilaudid  for pain.    1901 Sign out to oncoming team       MDM Elements  Discussion of Management with other Physicians, QHP or Appropriate Source: Admitting team - [facility]  Independent Interpretation of Studies: CT scan(s) - as above  I have reviewed recent and relevant previous record, including: Inpatient notes - prior hospitalization and Outpatient notes - GI  ____________________________________________    The case was discussed with the attending physician, who is in agreement with the above assessment and plan.      History     Chief Complaint  Chief Complaint   Patient presents with   ??? Abdominal Pain   ??? Wound Check       HPI   History of Present Illness  Jerome Kennedy. is a 48 year old male with stricturing ileocolonic Crohn's disease who presents with right lower quadrant pain.    He experiences right lower quadrant abdominal pain that began approximately one hour prior to arrival. He has a history of Crohn's disease, with previous exacerbations presenting similarly. He was hospitalized on April 08, 2024, for a Crohn's exacerbation, experiencing severe right lower quadrant pain, nausea, vomiting, and 45 episodes of watery diarrhea. A colonoscopy at that time showed diffuse inflammation, congestion, loss of vascular pattern, and deep ulcerations in the ileum, with a Rutgeerts score of I3 to I4. Biopsies confirmed Crohn's disease with ileitis, but no dysplasia or malignancy. A CT scan on April 07, 2024, showed wall distention and thickening of the rectum and distal sigmoid colon. He was scheduled for a follow-up with gastroenterology on April 19, 2024, but did not attend.    He has nausea and  vomiting, with three episodes of vomiting this morning, described as yellow-greenish in color. He also has diarrhea, consistent with his baseline symptoms of Crohn's disease. No recent changes in symptoms outside of the current episode.    He has not started Skyrizi, which was planned for his Crohn's treatment, and is not currently on any medication for Crohn's. He has been using Tylenol  for pain management. Zofran  is ineffective for his nausea, preferring Compazine  instead. He is allergic to morphine  and Toradol, and typically uses Dilaudid  for pain management.    He also has right ankle pain and swelling for about a week. He was prescribed levofloxacin for presumed cellulitis, but reports no improvement. The ankle remains swollen, red, and tender, and he is unable to walk on it. He has two doses of levofloxacin remaining.    He has chills, a runny nose, and congestion for the past three days, but no shortness of breath or chest pain. He has not been able to eat or drink due to nausea and vomiting, which has persisted for more than an hour.        Past Medical History[1]    Past Surgical History[2]    Active Medications[3]     Allergies[4]    Family History[5]    Short Social History[6]     Physical Exam     VITAL SIGNS:      Vitals:    05/14/24 1122 05/14/24 1123 05/14/24 1124 05/14/24 1647   BP:       Pulse:   100 85   Resp:   18 14   Temp:   36.9 ??C (98.4 ??F)    TempSrc:   Oral Oral   SpO2: 100% 99% 100%        Constitutional: Alert and oriented. Moderately distressed due to pain/discomfort  Eyes: Conjunctivae are normal.  HEENT: Normocephalic and atraumatic. Conjunctivae clear. Congestion. Runny nose. Dry mucous membranes.   Cardiovascular: Rate as above, regular rhythm. Normal and symmetric distal pulses. Brisk capillary refill. Normal skin turgor.  Respiratory: Normal respiratory effort. Breath sounds are normal. There are no wheezing or crackles heard, but appreciated upper airway sounds.   Gastrointestinal: Soft, multiple scars from prior surgeries, large periumbilical hernia at midline, exquisitely tender to RLQ, referred tenderness to RUQ  Genitourinary: Deferred.  Musculoskeletal: Non-tender with normal range of motion in all extremities.  Neurologic: Normal speech and language. No gross focal neurologic deficits are appreciated. Patient is moving all extremities equally, face is symmetric at rest and with speech.  Skin: Skin is warm, dry. Lichenified wound on right lateral aspect of ankle with surrounding erythema and tenderness. Very tender to touch. Bilateral red papules on arms  Psychiatric: Mood and affect are normal. Speech and behavior are normal.     Radiology     CT Abdomen Pelvis W IV Contrast   Preliminary Result   --Inflammatory changes of the ileum proximal to the ileocolonic anastomosis again noted with decreased associated small bowel dilation, may represent inflammatory/infectious colitis or changes related to a Crohn's flare.      --Similar wall thickening of the distal ileum just proximal to the ileocolic anastomosis compatible with perianastomotic Crohn's disease.      --Increased protrusion of loops of large bowel through the moderate-sized ventral abdominal wall hernia without evidence of strangulation or obstruction.         XR Ankle 3 or More Views Right   Final Result   No acute fracture. No radiographic evidence of acute osteomyelitis. No  tracking soft tissue gas.             Pertinent labs & imaging results that were available during my care of the patient were independently interpreted by me and considered in my medical decision making (see chart for details).    Portions of this record have been created using Scientist, clinical (histocompatibility and immunogenetics). Dictation errors have been sought, but may not have been identified and corrected.           [1]  Past Medical History:  Diagnosis Date   ??? Anemia 04/05/2022   ??? Anxiety    ??? Avascular necrosis of femur head, right    (CMS-HCC) 2011   ??? Bipolar disorder    (CMS-HCC) 2013    Follows the Psychiatry   ??? Cannabis use disorder    ??? COPD (chronic obstructive pulmonary disease)    (CMS-HCC) 2022   ??? Crohn's disease    (CMS-HCC) 1990   ??? Depression 2007    Severe depressive episode with psychotic symptoms   ??? 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 Myalgia    ??? Rhinovirus infection 10/29/2022   ??? SBO (small bowel obstruction)    (CMS-HCC) 2011   [2]  Past Surgical History:  Procedure Laterality Date   ??? CHOLECYSTECTOMY     ??? COLON SURGERY      partial resection   ??? HERNIA REPAIR     ??? PR ARTHRP INTERCARPAL/CARP/MTCRPL JT INTERPOSITION Left 08/07/2022    Procedure: INTERPOSIT ARTHROPLASTY-INTERCARPAL/CARPOMETACAR;  Surgeon: Bonifacio Cordella HERO, MD;  Location: MAIN OR [facility];  Service: Orthopedics   ??? PR CLOSE ENTEROSTOMY,RESEC+COLOREC ANAS Midline 11/01/2019    Procedure: CLO ENTEROSTOMY; W/RESECT COLORECTAL ANASTOM;  Surgeon: Aloysius Euell Quale, MD;  Location: MAIN OR [place];  Service: Gastrointestinal   ??? PR COLONOSCOPY FLX DX W/COLLJ SPEC WHEN PFRMD Left 01/07/2013    Procedure: COLONOSCOPY, FLEXIBLE, PROXIMAL TO SPLENIC FLEXURE; DIAGNOSTIC, W/WO COLLECTION SPECIMEN BY BRUSH OR WASH;  Surgeon: Liliane JENEANE Marker, MD;  Location: GI PROC[facility]URES [facility];  Service: Gastroenterology   ??? PR COLONOSCOPY FLX DX W/COLLJ SPEC WHEN PFRMD  03/09/2015    Procedure: COLONOSCOPY, FLEXIBLE, PROXIMAL TO SPLENIC FLEXURE; DIAGNOSTIC, W/WO COLLECTION SPECIMEN BY BRUSH OR WASH;  Surgeon: Artist Jayson Leather, MD;  Location: GI PROC[facility]URES [facility];  Service: Gastroenterology   ??? PR COLONOSCOPY FLX DX W/COLLJ SPEC WHEN PFRMD N/A 10/28/2019    Procedure: COLONOSCOPY, FLEXIBLE, PROXIMAL TO SPLENIC FLEXURE; DIAGNOSTIC, W/WO COLLECTION SPECIMEN BY BRUSH OR WASH;  Surgeon: Derick Bring, MD;  Location: GI PROCEDURES [facility];  Service: Gastroenterology   ??? PR COLONOSCOPY FLX DX W/COLLJ SPEC WHEN PFRMD N/A 10/01/2022    Procedure: COLONOSCOPY, FLEXIBLE, PROXIMAL TO SPLENIC FLEXURE; DIAGNOSTIC, W/WO COLLECTION SPECIMEN BY BRUSH OR WASH;  Surgeon: Darrick Peck, MD;  Location: GI PROC[facility]URES [facility];  Service: Gastroenterology   ??? PR COLONOSCOPY FLX DX W/COLLJ SPEC WHEN PFRMD N/A 11/30/2023    Procedure: COLONOSCOPY, FLEXIBLE, PROXIMAL TO SPLENIC FLEXURE; DIAGNOSTIC, W/WO COLLECTION SPECIMEN BY BRUSH OR WASH;  Surgeon: Audrey Lacinda Hun, MD;  Location: GI PROCEDURES [facility];  Service: Gastroenterology   ??? PR COLONOSCOPY W/BIOPSY SINGLE/MULTIPLE N/A 04/08/2024    Procedure: COLONOSCOPY, FLEXIBLE, PROXIMAL TO SPLENIC FLEXURE; WITH BIOPSY, SINGLE OR MULTIPLE;  Surgeon: Celestina Comer Norris, MD;  Location: GI PROCEDURES [facility];  Service: Gastroenterology   ??? PR EXPLORATORY OF ABDOMEN Midline 09/02/2019  Procedure: EXPLORATORY LAPAROTOMY, EXPLORATORY CELIOTOMY WITH OR WITHOUT BIOPSY(S);  Surgeon: Aloysius Euell Quale, MD;  Location: MAIN OR [facility];  Service: Gastrointestinal   ??? PR EXPLORATORY OF ABDOMEN Midline 11/01/2019    Procedure: EXPLORATORY LAPAROTOMY, EXPLORATORY CELIOTOMY WITH OR WITHOUT BIOPSY(S);  Surgeon: Aloysius Euell Quale, MD;  Location: MAIN OR [place];  Service: Gastrointestinal   ??? PR FIX FINGER,VOLAR PLATE,I-P JT Left 08/07/2022    Procedure: REPAIR AND RECONSTRUCTION, FINGER, VOLAR PLATE, INTERPHALANGEAL JOINT;  Surgeon: Bonifacio Cordella HERO, MD;  Location: MAIN OR [facility];  Service: Orthopedics   ??? PR FREEING BOWEL ADHESION,ENTEROLYSIS N/A 11/01/2019    Procedure: Enterolysis (Separt Proc);  Surgeon: Aloysius Euell Quale, MD;  Location: MAIN OR [facility];  Service: Gastrointestinal   ??? PR FUSION MC-P JT Left 08/27/2023    Procedure: ARTHRODESIS, METACARPOPHALANGEAL JOINT, WITH OR WITHOUT INTERNAL FIXATION;  Surgeon: Bonifacio Cordella HERO, MD;  Location: OR [facility];  Service: Orthopedics   ??? PR ILEOSCOPY THRU STOMA,BIOPSY N/A 10/14/2019    Procedure: LYNETT; LORN 1/MX;  Surgeon: Thedora Alm Plain, MD;  Location: GI PROCEDURES [facility];  Service: Gastroenterology   ??? PR PART REMOVAL COLON W COLOSTOMY Midline 09/02/2019    Procedure: COLECTOMY, PARTIAL; WITH SKIN LEVEL CECOSTOMY OR COLOSTOMY;  Surgeon: Aloysius Euell Quale, MD;  Location: MAIN OR [facility];  Service: Gastrointestinal   ??? PR REPAIR INCISIONAL HERNIA,REDUCIBLE Midline 09/02/2019    Procedure: REPAIR INIT INCISIONAL OR VENTRAL HERNIA; REDUCIBLE;  Surgeon: Arnette Darice Burnet, MD;  Location: MAIN OR [facility];  Service: Trauma   ??? PR UPPER GI ENDOSCOPY,BIOPSY N/A 01/07/2013    Procedure: UGI ENDOSCOPY; WITH BIOPSY, SINGLE OR MULTIPLE;  Surgeon: Liliane JENEANE Marker, MD;  Location: GI PROCEDURES [facility];  Service: Gastroenterology   ??? PR UPPER GI ENDOSCOPY,BIOPSY N/A 10/14/2019    Procedure: UGI ENDOSCOPY; WITH BIOPSY, SINGLE OR MULTIPLE;  Surgeon: Thedora Alm Plain, MD;  Location: GI [facility]URES [facility];  Service: Gastroenterology   ??? PR UPPER GI ENDOSCOPY,DIAGNOSIS N/A 10/01/2022    Procedure: UGI ENDO, INCLUDE ESOPHAGUS, STOMACH, & DUODENUM &/OR JEJUNUM; DX W/WO COLLECTION SPECIMN, BY BRUSH OR WASH;  Surgeon: Darrick Peck, MD;  Location: GI PROC[facility]URES [facility];  Service: Gastroenterology [3]  Current Facility-Administered Medications   Medication Dose Route Frequency Provider Last Rate Last Admin   ??? HYDROmorphone  (PF) (DILAUDID ) injection 1 mg  1 mg Intravenous Q4H PRN Mercie Andrea CROME, MD   1 mg at 05/14/24 1451   ??? hydrOXYzine  (ATARAX ) tablet 25 mg  25 mg Oral Q6H PRN Mercie Andrea CROME, MD   25 mg at 05/14/24 1301   ??? potassium chloride  10 mEq in 100 mL IVPB  10 mEq Intravenous Once Jaziyah Gradel L, MD         Current Outpatient Medications   Medication Sig Dispense Refill   ??? doxycycline  (VIBRAMYCIN ) 100 MG capsule Take 1 capsule (100 mg total) by mouth two (2) times a day. 28 capsule 0   ??? empty container Misc Use as directed 1 each 1   ??? ergocalciferol-1,250 mcg, 50,000 unit, (DRISDOL) 1,250 mcg (50,000 unit) capsule Take 1 capsule (1,250 mcg total) by mouth once a week.     ??? ferrous sulfate  325 (65 FE) MG tablet Take 1 tablet (325 mg total) by mouth in the morning.     ??? FLONASE  ALLERGY RELIEF 50 mcg/actuation nasal spray 2 sprays into each nostril daily as needed.     ??? guselkumab  (TREMFYA  PEN) 200 mg/2 mL PnIj Inject the contents of 1 pen (  200 mg) under the skin every twenty-eight (28) days. 2 mL 5   ??? levoFLOXacin (LEVAQUIN) 500 MG tablet Take 1 tablet (500 mg total) by mouth daily with food 10 tablet 0   ??? nicotine  (NICODERM CQ ) 14 mg/24 hr patch Place 1 patch on the skin daily. 28 patch 1   ??? ondansetron  (ZOFRAN -ODT) 4 MG disintegrating tablet Take 1 tablet (4 mg total) by mouth every eight (8) hours as needed for nausea for up to 7 days. 21 tablet 0   ??? OPTICHAMBER DIAMOND V[facility] Spcr      ??? pantoprazole  (PROTONIX ) 40 MG tablet Take 1 tablet (40 mg total) by mouth daily. 90 tablet 2   ??? QUEtiapine  (SEROQUEL ) 100 MG tablet Take 1 tablet (100 mg total) by mouth nightly.     ??? SYMBICORT  80-4.5 mcg/actuation inhaler Inhale 2 puffs  in the morning. 10.2 g 0   ??? VENTOLIN  HFA 90 mcg/actuation inhaler Inhale 2 puffs every six (6) hours as needed. 18 g 0   [4]  Allergies  Allergen Reactions ??? Morphine  Itching and Rash     He gets light headed   ??? Toradol [Ketorolac] Itching   [5]  Family History  Problem Relation Age of Onset   ??? Breast cancer Mother    ??? Diabetes Maternal Aunt    ??? Colon cancer Maternal Grandmother    ??? Prostate cancer Maternal Grandfather    ??? Cancer Paternal Grandmother    ??? Crohn's disease Neg Hx    [6]  Social History  Tobacco Use   ??? Smoking status: Every Day     Current packs/day: 1.00     Average packs/day: 0.6 packs/day for 22.6 years (13.1 ttl pk-yrs)     Types: Cigarettes     Start date: 10/02/2001     Passive exposure: Current   ??? Smokeless tobacco: Never   ??? Tobacco comments:     1ppd; down to 2cpd since March 2025   Vaping Use   ??? Vaping status: Some Days   ??? Substances: Nicotine    ??? Passive vaping exposure: Yes   Substance Use Topics   ??? Alcohol use: No     Alcohol/week: 0.0 standard drinks of alcohol   ??? Drug use: Yes     Types: Marijuana     Comment: Smokes 2-3 joints/day        Mercie Andrea CROME, MD  Resident  05/14/24 1901

## 2024-05-14 NOTE — Unmapped (Signed)
 Bed: 68-D  Expected date:   Expected time:   Means of arrival:   Comments:  Jerome Kennedy

## 2024-05-15 LAB — CBC W/ AUTO DIFF
BASOPHILS ABSOLUTE COUNT: 0 10*9/L (ref 0.0–0.1)
BASOPHILS RELATIVE PERCENT: 0.3 %
EOSINOPHILS ABSOLUTE COUNT: 0 10*9/L (ref 0.0–0.5)
EOSINOPHILS RELATIVE PERCENT: 0.2 %
HEMATOCRIT: 38.9 % — ABNORMAL LOW (ref 39.0–48.0)
HEMOGLOBIN: 12.9 g/dL (ref 12.9–16.5)
LYMPHOCYTES ABSOLUTE COUNT: 0.9 10*9/L — ABNORMAL LOW (ref 1.1–3.6)
LYMPHOCYTES RELATIVE PERCENT: 11.7 %
MEAN CORPUSCULAR HEMOGLOBIN CONC: 33.2 g/dL (ref 32.0–36.0)
MEAN CORPUSCULAR HEMOGLOBIN: 29.5 pg (ref 25.9–32.4)
MEAN CORPUSCULAR VOLUME: 88.9 fL (ref 77.6–95.7)
MEAN PLATELET VOLUME: 8.6 fL (ref 6.8–10.7)
MONOCYTES ABSOLUTE COUNT: 0.2 10*9/L — ABNORMAL LOW (ref 0.3–0.8)
MONOCYTES RELATIVE PERCENT: 2.5 %
NEUTROPHILS ABSOLUTE COUNT: 6.6 10*9/L (ref 1.8–7.8)
NEUTROPHILS RELATIVE PERCENT: 85.3 %
PLATELET COUNT: 156 10*9/L (ref 150–450)
RED BLOOD CELL COUNT: 4.37 10*12/L (ref 4.26–5.60)
RED CELL DISTRIBUTION WIDTH: 14.9 % (ref 12.2–15.2)
WBC ADJUSTED: 7.7 10*9/L (ref 3.6–11.2)

## 2024-05-15 LAB — BASIC METABOLIC PANEL
ANION GAP: 12 mmol/L (ref 5–14)
BLOOD UREA NITROGEN: <5 mg/dL — ABNORMAL LOW (ref 9–23)
CALCIUM: 7.8 mg/dL — ABNORMAL LOW (ref 8.7–10.4)
CHLORIDE: 108 mmol/L — ABNORMAL HIGH (ref 98–107)
CO2: 22 mmol/L (ref 20.0–31.0)
CREATININE: 0.77 mg/dL (ref 0.73–1.18)
EGFR CKD-EPI (2021) MALE: >90 mL/min/1.73m2 (ref >=60)
GLUCOSE RANDOM: 104 mg/dL (ref 70–179)
POTASSIUM: 4 mmol/L (ref 3.4–4.8)
SODIUM: 142 mmol/L (ref 135–145)

## 2024-05-15 LAB — MAGNESIUM: MAGNESIUM: 2.1 mg/dL (ref 1.6–2.6)

## 2024-05-15 LAB — URIC ACID: URIC ACID: 3 mg/dL — ABNORMAL LOW (ref 3.7–9.2)

## 2024-05-15 LAB — C-REACTIVE PROTEIN: C-REACTIVE PROTEIN: 64 mg/L — ABNORMAL HIGH (ref <=10.0)

## 2024-05-15 MED ADMIN — ferrous sulfate tablet 325 mg: 325 mg | ORAL | @ 13:00:00

## 2024-05-15 MED ADMIN — HYDROmorphone (PF) (DILAUDID) injection 1 mg: 1 mg | INTRAVENOUS | @ 01:00:00 | Stop: 2024-05-15

## 2024-05-15 MED ADMIN — acetaminophen (OFIRMEV) 10 mg/mL injection 1,000 mg: 1000 mg | INTRAVENOUS | @ 02:00:00 | Stop: 2024-05-15

## 2024-05-15 MED ADMIN — nicotine (NICODERM CQ) 14 mg/24 hr patch 1 patch: 1 | TRANSDERMAL | @ 04:00:00

## 2024-05-15 MED ADMIN — oxyCODONE (ROXICODONE) immediate release tablet 5 mg: 5 mg | ORAL | @ 18:00:00 | Stop: 2024-05-22

## 2024-05-15 MED ADMIN — magnesium sulfate 2gm/50mL IVPB: 2 g | INTRAVENOUS | @ 02:00:00 | Stop: 2024-05-14

## 2024-05-15 MED ADMIN — oxyCODONE (ROXICODONE) immediate release tablet 5 mg: 5 mg | ORAL | Stop: 2024-05-22

## 2024-05-15 MED ADMIN — acetaminophen (OFIRMEV) 10 mg/mL injection 1,000 mg: 1000 mg | INTRAVENOUS | @ 07:00:00 | Stop: 2024-05-15

## 2024-05-15 MED ADMIN — hydrOXYzine (ATARAX) tablet 25 mg: 25 mg | ORAL | @ 18:00:00

## 2024-05-15 MED ADMIN — HYDROmorphone (PF) (DILAUDID) injection 1 mg: 1 mg | INTRAVENOUS | @ 13:00:00 | Stop: 2024-05-15

## 2024-05-15 MED ADMIN — HYDROmorphone (PF) (DILAUDID) injection 1 mg: 1 mg | INTRAVENOUS | @ 09:00:00 | Stop: 2024-05-15

## 2024-05-15 MED ADMIN — ondansetron (ZOFRAN) injection 8 mg: 8 mg | INTRAVENOUS | @ 09:00:00 | Stop: 2024-05-15

## 2024-05-15 MED ADMIN — HYDROmorphone (PF) (DILAUDID) injection 1 mg: 1 mg | INTRAVENOUS | @ 05:00:00 | Stop: 2024-05-15

## 2024-05-15 MED ADMIN — enoxaparin (LOVENOX) syringe 40 mg: 40 mg | SUBCUTANEOUS | @ 02:00:00

## 2024-05-15 MED ADMIN — methylPREDNISolone sodium succinate (SOLU-Medrol) injection 20 mg: 20 mg | INTRAVENOUS | @ 02:00:00 | Stop: 2024-05-14

## 2024-05-15 MED ADMIN — pantoprazole (Protonix) EC tablet 40 mg: 40 mg | ORAL | @ 16:00:00

## 2024-05-15 MED ADMIN — fluticasone furoate-vilanterol (BREO ELLIPTA) 100-25 mcg/dose inhaler 1 puff: 1 | RESPIRATORY_TRACT | @ 13:00:00

## 2024-05-15 MED ADMIN — ondansetron (ZOFRAN) injection 4 mg: 4 mg | INTRAVENOUS | @ 01:00:00 | Stop: 2024-05-14

## 2024-05-15 MED ADMIN — QUEtiapine (SEROQUEL) tablet 100 mg: 100 mg | ORAL | @ 04:00:00

## 2024-05-15 MED ADMIN — acetaminophen (OFIRMEV) 10 mg/mL injection 1,000 mg: 1000 mg | INTRAVENOUS | @ 16:00:00 | Stop: 2024-05-15

## 2024-05-15 NOTE — Unmapped (Signed)
 Hospital Medicine Daily Progress Note    Assessment/Plan:    Jerome Kennedy. is a 48 y.o. male who is presenting to [facility] with stricturing ileocolonic Crohn's disease, supposed copd (not confirmed by pfts), bipolar 1 disorder, prior DVT not on AC at home, tobacco use disorder, and cocaine/marijuana use who presented to the hospital with a couple of issues.  His main complaint was RLE pain, redness, and swelling.      Acute pain of right ankle with associated ulceration  No known trauma or injuries. Initial suspicion for gout based on appearance of the erythema, limited ROM, and marked tenderness of the right ankle. No obvious risk factors for development of gout, aside from diet possibly high in red meat. Uric acid level low at 0.3. Recently completed a course of antibiotic therapy as an outpatient with reported minimal improvement. Got a dose of vanc/zosyn  in the ED but overall suspicion for cellulitis is low at this time. Continues to endorse exquisite pain of the right ankle.   -Follow-up CT of right ankle WO  -Follow-up PVL venous duplex of RLE  -Consider arthrocentesis pending clinical progression  -Scheduled Acetaminophen  and Oxycodone  PRN, no NSAIDS with IBD as below  -Hold on initiation of Colchicine for now with concern of GI effects  -If after work-up, stronger suspicion for gout, may consider trial of Anakinra  -Will hold on reinitiation of antibiotic therapy at this time, low threshold to initiate for systemic signs of infection or decompensation     Stricturing ileocolonic Crohns   Denies any GI symptomology aside from diarrhea and some mild RLQ abdominal discomfort. Denies any diarrhea and hgb is stable.  Imaging overall stable it appears, but with a new area of inflammation around the neoterminal ileum. Unfortunately, he missed his recent outpatient GI follow-up on 04/19/2024.  He denies any transportation issues. Received IV Methylprednisolone  in ED, however holding on further steroids at this time. Ultimately, needs to start biologics (has failed numerous agents but suspect noncompliance to be the issue rather than true failure).  Scheduled to be on Tremfaya.    -Appreciate GI Luminal consultation and recommendations  -Hold on further steroidal treatment at this time  -Follow-up GIPP and C. Difficile assay  -Daily CRP, CMP, CBC   -DVT prophylaxis as below  -Outpatient GI follow-up 05/27/2024    ?Irritant contact dermatitis  Noted erythematous rash of forearms bilaterally of unclear etiology. Patient denies any known exposure to irritants. Endorsing some pruritus, otherwise, not significantly bothersome. Low suspicion for superimposed infection.   -Start hydrocortisone 2.5% cream BID     Cannabinoid hyperemesis syndrome (improved)  Has smoked marijuana for quite some time and gets relief of symptoms with hot showers. Noted electrolyte derangements upon admission, now resolved with repletion. Denying any nausea or recent vomiting at time of exam today, 10/12.    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  He is not on lifelong anticoagulation due to bleeding risk with IBD per hematology recommendations, though could possibly be reconsidered as his hgb is stable, with prothrombic state of Crohn's disease.  -Follow-up PVL venous duplex of RLE as above  -Prophylactic Lovenox      Bipolar 1 disorder  -Continue Seroquel       COPD  Supposed diagnosis, unable to locate any previous PFT's, or imaging findings consistent with this diagnosis.  -Continue formulary substitute for home Symbicort   Prophylaxis  -Lovenox       Diet  -Nutrition Therapy Regular/House     Code Status / HCDM  -Full Code, Discussed with patient at the time of admission   -HCDM (patient stated preference): Jerome Kennedy, Jerome Kennedy - Brother - [phone]    I personally spent 40 minutes face-to-face and non-face-to-face in the care of this patient, which includes all pre, intra, and post visit time on the date of service.  All documented time was specific to the E/M visit and does not include any procedures that may have been performed.  ___________________________________________________________________    Subjective:  Patient seen and examined. No acute events overnight. Sitting on the EOB consuming lunch. States that his nausea has improved and denies any recent vomiting. Having mild RLQ abdominal discomfort. Denies any recent diarrhea. States that his last bowel movement was yesterday morning, and was formed. Continues to have significant discomfort of his right ankle. Patient appears in no acute distress.     Labs/Studies:  Labs and Studies from the last 24hrs per EMR and Reviewed    Objective:  Temp:  [36.3 ??C (97.3 ??F)-36.7 ??C (98.1 ??F)] 36.6 ??C (97.9 ??F)  Pulse:  [71-85] 71  Resp:  [14-18] 18  BP: (99-122)/(69-82) 106/69  SpO2:  [96 %-99 %] 97 %    General: Awake and alert. Sitting on EOB, in no acute distress.  Skin: Warm, dry, and intact, without   Head: Normocephalic.  Eyes: Conjunctivae clear without exudates or hemorrhage. Eyelids normal in appearance without swelling or lesions.  Cardiac: Heart rate and rhythm normal  Extremities: 2+ non-pitting edema with accopanying warmth and erythema of right ankle. Also with epidermal scaling. No purulence. Erythematous rash of bilateral forearms.   Psychiatric: appropriate mood and affect  Neurological: Awake and alert with normal speech. No gross neurological deficits.   GU: Deferred  GI: Abdomen soft and symmetric without distention, bowel sounds present and normoactive in all four quadrants. No wincing or grimacing with gentle palpation. Umbilical protrusion noted.   Respiratory: chest wall symmetric and without deformity, no respiratory distress, lung sounds CTA.

## 2024-05-15 NOTE — Unmapped (Signed)
 The patient was seen being wheeled by transport via stretcher. He is able to verbalize his needs. Upon observation, right ankle swelling is noted. Patient was oriented with the room and call light placed within reach. He reports abdominal pain and requested some PRN medication. PRN Dilaudid  given as needed. Vital signs taken and recorded. Monitored accordingly. Patient was instructed to collect stool specimen for further testing. Needs attended. Plan of care ongoing.     Shift Summary  HYDROmorphone  (PF) was administered PRN in both the [facility] Emergency Department and current department for pain management, with acetaminophen  also given in the current department.   Fall prevention strategies were maintained throughout the shift, including bed alarms and environmental modifications, with no falls or injuries documented.   The patient remained independent in most activities of daily living, requiring assistance only for toileting, transferring, and mobility.   No episodes of nausea or vomiting were documented during the shift.   The patient???s overall status remained stable with ongoing pain management and safety interventions.     Absence of Fall and Fall-Related Injury: Fall reduction interventions were consistently maintained throughout the shift, including bed alarms, environmental modifications, and nonskid footwear, with hourly visual checks confirming the patient remained in bed and awake or resting; no falls or injuries occurred during the shift.     Improved Ability to Complete Activities of Daily Living: Independence was maintained in feeding, hygiene, dressing, grooming, and bathing, but assistance continued to be needed for toileting, transferring, and mobility; vision, judgment, and memory remained adequate for safe completion of daily activities.     Nausea and Vomiting Relief: No documentation of nausea or vomiting was noted during the shift, and ondansetron  was previously administered in the [facility] Emergency Department.     Optimal Pain Control and Function: Pain in the right lower extremity and abdomen fluctuated between 7 and 8 on the pain scale, with HYDROmorphone  (PF) administered PRN in both the [facility] Emergency Department and current department, and acetaminophen  administered in the current department; pain descriptors changed from throbbing/tender to aching, and the patient was noted to be sleeping after intervention.

## 2024-05-15 NOTE — Unmapped (Signed)
 VENOUS ACCESS TEAM PROCEDURE    Nurse request was placed for a PIV by Venous Access Team (VAT).  Patient was assessed at bedside for placement of a PIV. PPE were donned per protocol.  Access was obtained. Blood return noted.  Dressing intact and device well secured.  Flushed with normal saline.  See LDA for details.  Pt advised to inform RN of any s/s of discomfort at the PIV site.    Workup / Procedure Time:  30 minutes       Care RN was notified.       Thank you,     Gillie Manners, RN Venous Access Team

## 2024-05-15 NOTE — Unmapped (Signed)
 Shift Summary  Pt is alert and oriented X4. Needs attended. Plan of care on progress.  Pain was managed with PRN HYDROmorphone  and oxyCODONE , resulting in temporary reduction of pain scores.   Fall prevention strategies, including bed alarms and hourly checks, were maintained throughout the shift.   Enteric precautions and skin protection interventions were consistently applied.   Laboratory results included elevated CRP and abnormal CBC, but C. difficile and blood cultures were negative.   Remained under observation with no new hospital-acquired injuries or falls reported during the shift.    Absence of Hospital-Acquired Illness or Injury: No new hospital-acquired injuries were documented, and skin protection measures such as incontinence pads and regular repositioning were maintained throughout the shift.    Readiness for Transition of Care: Current status remains observation, and LTACH candidacy score was unchanged during the shift.    Absence of Infection Signs and Symptoms: Temperature remained stable and within normal range, enteric precautions were maintained, and C. difficile PCR screen and blood cultures were negative; however, CRP was elevated and CBC showed some abnormal values.    Absence of Fall and Fall-Related Injury: Fall reduction program and hourly visual checks were consistently implemented, bed alarm was activated, and safety devices were used for transfers as needed; no falls or injuries were reported.    Optimal Pain Control and Function: Pain levels fluctuated, with periods of significant reduction following PRN administration of HYDROmorphone  and oxyCODONE , though pain recurred later in the shift; patient declined some interventions, and pain location shifted from abdomen to right ankle.

## 2024-05-15 NOTE — Unmapped (Signed)
 Luminal Gastroenterology Consult Service   Initial Consultation         Assessment and Recommendations:   Jerome Serena. is a 47 y.o. 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 The patient is seen in consultation at the request of Sotero Ban, FNP ([facility] for Crohn's disease.    Stricturing ileocolonic Crohns   Patient has had a history of multiple surgeries for stricturing disease-is now transitioning care to [facility] NP and has follow-up with her October 23.  Has been on Tremfya  intermittently since June, but presented with a flare in September with evidence active ileal disease  Rutgeerts score I3-I4 as well as Campylobacter infection.  Was doing well thereafter and has continued Tremfya , but is now presenting with an acute presentation of right lower quadrant pain, nausea vomiting, that has since improved.  CT shows evidence of continued inflammation of the neoterminal ileum consistent with prior scope.  Patient is likely presenting with an acute infection given acute onset of symptoms (x 1 day).  Will rule out infection prior to starting steroids.    Right lower extremity ulcer  Patient presented mainly to have treatment of his right ankle, which has been more swollen and has some skin ulcerations on the lateral side.  Recommend further evaluation.  This may be the source of the elevated CRP.    RECOMMENDATIONS:  Order GI pathogen panel, C. Difficile Assay  Start DVT prophylaxis with LMWH (patient is at high risk for clots in setting of IBD flare)  Order daily CRP, CMP, CBC  Will hold off on steroids for now until infectious work-up complete  Recommend further imaging/work-up/Derm consult for R lateral malleolar ulcer  Follow-up with [place] NP 05/26/24    Issues Impacting Complexity of Management:  -None    Recommendations discussed with the patient's primary team. We will continue to follow along with you.    For questions, contact the on-call fellow for the Gastroenterology (Luminal) Consult Service.    Subjective:     History of Present Illness  Jerome Sweet. is a 48 year old male with Crohn's disease who presents with abdominal pain and ankle swelling.    He has been experiencing abdominal pain since yesterday evening, similar to previous episodes related to his Crohn's disease. He has not had any bowel movements since yesterday morning.  Normally has two bowel movements per day. He is currently on Skyrizi for his Crohn's disease and has recently started Tremfya , with his last dose administered two weeks ago. This was his third dose. He is no longer taking steroids. No blood in stool, nausea, vomiting, or weight loss.  Currently eating without any difficulties he denies any perianal disease such as fissures.    He recently presented in September for a Crohns flare and started on IV methylprednisolone . Colonoscopy 9/5 showed inflammation in the neoterminal ileum, Rutgeerts score I4. Also found to have a Campylobacter infection and was given antibiotics as well as a short prednisone  taper (Prednisone  taper with 40mg  x2 days, 20mg  x 2 days, and 10mg  x2 days) with close follow-up on 9/16.  He missed that appointment and is rescheduled for October.    Hemoglobin stable at 12.1, CRP elevated from discharge a  month ago 53 (from 46).  CT with new inflammatory change in the neoterminal ileum proximal to the ileocolonic anastomosis with decreased associated small bowel dilation, suspicious for active perianastomotic Crohn's disease.  Similar to decreased wall thickening of the distal rectum with decreased immediately upstream rectal dilation though overall similar colonic dilation.    Year of disease onset:  1990's  Diagnosis:  Crohn's Disease  Age at onset:   74-40 yr old (A2)  Location:  Ileocolonic (L3)  Behavior:  Stricturing (B2)  Perianal Dz:  No     Brief IBD Disease Course:       2003 ileocecectomy  2004 ventral abdominal hernia and incisional hernia repair   04/2022 hospital admission for C Diff infection  06/10/22 initial visit, continue humira , check fCal   07/2022 inpatient admission for outpatient labs showing hypocalcemia, CT with long segment circumferential bowel wall thickening and hyperenhancement of the neoterminal ileum extending through the anastomosis of the proximal colon which was increased from prior CT scan along with luminal narrowing of this long segment of terminal ileum with focal proximal dilation concerning for stricture. Continue weekly humira  (had been increased 1 week prior to hospital admission).   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 Advised no steroids and continue Humira  on discharge  11/2022 multiple hospitalizations for abd pain, left AMA on 4/14 and again left AMA on 4/20. Re-admitted 4/23 abd underwent colonoscopy 11/30/23 which showed ongoing ileal disease in the 10 cm of neo-TI examined, Rutgeerts i3, colon otherwise normal. Advised to switch to Rinvoq  however he did not start therapy until over a month later despite insurance approval (non-compliance). Saw the Rinvoq  tablets in his stool so plan was to switch to Skyrizi however a peripheral IV was unable to be placed during his first infusion despite 5 staff members attempting IV placement so aborted. Switch to Tremfya .     Medications:  Humira    Rinvoq  - saw pills in his stool  Skyrizi - could not place PIV during infusion  Tremfya  - 01/14/24    Prior GI work-up:  04/08/24 Colonoscopy: Poor bowel prep.  Hemorrhoids seen on perianal exam.  Crohn's disease with ileitis, Rutgeerts score I3-I4.  Normal mucosa in the entire examined colon.  Dilated in the rectum sigmoid descending and transverse.  Biopsies of the rectum and sigmoid were normal.        Objective:   Temp:  [36.3 ??C (97.3 ??F)-36.9 ??C (98.4 ??F)] 36.3 ??C (97.3 ??F)  Pulse:  [77-100] 81  SpO2 Pulse:  [93-100] 100  Resp:  [14-18] 18  BP: (99-126)/(75-86) 122/82  SpO2:  [96 %-100 %] 99 %    Gen: WDWN male in NAD, answers questions appropriately  Abdomen: Soft, NTND, no rebound/guarding, no hepatosplenomegaly  Extremities: Right ankle with some swelling and lateral skin changes.    Pertinent Labs/Studies:  -I have reviewed the patient's labs from 05/15/24 which show stable Hgb and worsening CRP    CRP   Date Value Ref Range Status   05/15/2024 64.0 (H) <=10.0 mg/L Final   05/14/2024 53.1 (H) <=10.0 mg/L Final   04/08/2024 35.1 (H) <=10.0 mg/L Final   01/06/2015 <0.5 <=1.0 mg/dL Final   93/95/7985 4.5 (H) 0.0 - 1.0 mg/dL Final

## 2024-05-16 LAB — CBC
HEMATOCRIT: 33 % — ABNORMAL LOW (ref 39.0–48.0)
HEMOGLOBIN: 10.9 g/dL — ABNORMAL LOW (ref 12.9–16.5)
MEAN CORPUSCULAR HEMOGLOBIN CONC: 33 g/dL (ref 32.0–36.0)
MEAN CORPUSCULAR HEMOGLOBIN: 29.2 pg (ref 25.9–32.4)
MEAN CORPUSCULAR VOLUME: 88.5 fL (ref 77.6–95.7)
MEAN PLATELET VOLUME: 8.6 fL (ref 6.8–10.7)
PLATELET COUNT: 168 10*9/L (ref 150–450)
RED BLOOD CELL COUNT: 3.73 10*12/L — ABNORMAL LOW (ref 4.26–5.60)
RED CELL DISTRIBUTION WIDTH: 14.8 % (ref 12.2–15.2)
WBC ADJUSTED: 3.8 10*9/L (ref 3.6–11.2)

## 2024-05-16 LAB — COMPREHENSIVE METABOLIC PANEL
ALBUMIN: 2.4 g/dL — ABNORMAL LOW (ref 3.4–5.0)
ALKALINE PHOSPHATASE: 105 U/L (ref 46–116)
ALT (SGPT): <7 U/L — ABNORMAL LOW (ref 10–49)
ANION GAP: 9 mmol/L (ref 5–14)
AST (SGOT): <8 U/L (ref <=34)
BILIRUBIN TOTAL: <0.2 mg/dL — ABNORMAL LOW (ref 0.3–1.2)
BLOOD UREA NITROGEN: <5 mg/dL — ABNORMAL LOW (ref 9–23)
CALCIUM: 7.5 mg/dL — ABNORMAL LOW (ref 8.7–10.4)
CHLORIDE: 111 mmol/L — ABNORMAL HIGH (ref 98–107)
CO2: 23 mmol/L (ref 20.0–31.0)
CREATININE: 0.86 mg/dL (ref 0.73–1.18)
EGFR CKD-EPI (2021) MALE: >90 mL/min/1.73m2 (ref >=60)
GLUCOSE RANDOM: 101 mg/dL (ref 70–179)
POTASSIUM: 3.2 mmol/L — ABNORMAL LOW (ref 3.4–4.8)
PROTEIN TOTAL: 5.3 g/dL — ABNORMAL LOW (ref 5.7–8.2)
SODIUM: 143 mmol/L (ref 135–145)

## 2024-05-16 LAB — C-REACTIVE PROTEIN: C-REACTIVE PROTEIN: 40.5 mg/L — ABNORMAL HIGH (ref <=10.0)

## 2024-05-16 LAB — HEPATIC FUNCTION PANEL
ALBUMIN: 3.1 g/dL — ABNORMAL LOW (ref 3.4–5.0)
ALKALINE PHOSPHATASE: 143 U/L — ABNORMAL HIGH (ref 46–116)
ALT (SGPT): 7 U/L — ABNORMAL LOW (ref 10–49)
AST (SGOT): 17 U/L (ref <=34)
BILIRUBIN DIRECT: 0.1 mg/dL (ref 0.00–0.30)
BILIRUBIN TOTAL: 0.2 mg/dL — ABNORMAL LOW (ref 0.3–1.2)
PROTEIN TOTAL: 6.5 g/dL (ref 5.7–8.2)

## 2024-05-16 LAB — SEDIMENTATION RATE: ERYTHROCYTE SEDIMENTATION RATE: 12 mm/h (ref 0–15)

## 2024-05-16 MED ORDER — HYDROCORTISONE 2.5 % TOPICAL CREAM
Freq: Two times a day (BID) | TOPICAL | 0 refills | 0.00000 days | Status: CP
Start: 2024-05-16 — End: 2025-05-16
  Filled 2024-05-16: qty 30, 15d supply, fill #0

## 2024-05-16 MED ORDER — CEPHALEXIN 500 MG CAPSULE
ORAL_CAPSULE | Freq: Two times a day (BID) | ORAL | 0 refills | 7.00000 days | Status: CP
Start: 2024-05-16 — End: 2024-05-23
  Filled 2024-05-16: qty 14, 7d supply, fill #0

## 2024-05-16 MED ORDER — OXYCODONE 5 MG TABLET
ORAL_TABLET | ORAL | 0 refills | 4.00000 days | Status: CP | PRN
Start: 2024-05-16 — End: 2024-05-21

## 2024-05-16 MED ORDER — ACETAMINOPHEN 500 MG TABLET
ORAL_TABLET | Freq: Three times a day (TID) | ORAL | 0 refills | 5.00000 days | Status: CP
Start: 2024-05-16 — End: ?
  Filled 2024-05-16: qty 30, 5d supply, fill #0
  Filled 2024-05-16: qty 20, 4d supply, fill #0

## 2024-05-16 MED ADMIN — oxyCODONE (ROXICODONE) immediate release tablet 5 mg: 5 mg | ORAL | @ 10:00:00 | Stop: 2024-05-16

## 2024-05-16 MED ADMIN — oxyCODONE (ROXICODONE) immediate release tablet 5 mg: 5 mg | ORAL | @ 05:00:00 | Stop: 2024-05-16

## 2024-05-16 MED ADMIN — acetaminophen (TYLENOL) tablet 1,000 mg: 1000 mg | ORAL | @ 10:00:00 | Stop: 2024-05-16

## 2024-05-16 MED ADMIN — acetaminophen (TYLENOL) tablet 1,000 mg: 1000 mg | ORAL | @ 01:00:00

## 2024-05-16 MED ADMIN — nicotine (NICODERM CQ) 14 mg/24 hr patch 1 patch: 1 | TRANSDERMAL | @ 04:00:00 | Stop: 2024-05-16

## 2024-05-16 MED ADMIN — oxyCODONE (ROXICODONE) immediate release tablet 5 mg: 5 mg | ORAL | @ 20:00:00 | Stop: 2024-05-16

## 2024-05-16 MED ADMIN — ferrous sulfate tablet 325 mg: 325 mg | ORAL | @ 13:00:00 | Stop: 2024-05-16

## 2024-05-16 MED ADMIN — hydrocortisone 2.5 % cream: TOPICAL | @ 01:00:00

## 2024-05-16 MED ADMIN — QUEtiapine (SEROQUEL) tablet 100 mg: 100 mg | ORAL | @ 01:00:00

## 2024-05-16 MED ADMIN — hydrocortisone 2.5 % cream: TOPICAL | @ 13:00:00 | Stop: 2024-05-16

## 2024-05-16 MED ADMIN — enoxaparin (LOVENOX) syringe 40 mg: 40 mg | SUBCUTANEOUS | @ 01:00:00

## 2024-05-16 MED ADMIN — fluticasone furoate-vilanterol (BREO ELLIPTA) 100-25 mcg/dose inhaler 1 puff: 1 | RESPIRATORY_TRACT | @ 13:00:00 | Stop: 2024-05-16

## 2024-05-16 MED ADMIN — oxyCODONE (ROXICODONE) immediate release tablet 5 mg: 5 mg | ORAL | @ 16:00:00 | Stop: 2024-05-16

## 2024-05-16 MED ADMIN — cephalexin (KEFLEX) capsule 500 mg: 500 mg | ORAL | @ 21:00:00 | Stop: 2024-05-16

## 2024-05-16 MED ADMIN — pantoprazole (Protonix) EC tablet 40 mg: 40 mg | ORAL | @ 11:00:00 | Stop: 2024-05-16

## 2024-05-16 MED ADMIN — potassium chloride ER tablet 40 mEq: 40 meq | ORAL | @ 15:00:00 | Stop: 2024-05-16

## 2024-05-16 NOTE — Unmapped (Signed)
 Problem: Adult Inpatient Plan of Care  Goal: Absence of Hospital-Acquired Illness or Injury  Outcome: Shift Focus  Intervention: Identify and Manage Fall Risk  Recent Flowsheet Documentation  Taken 05/16/2024 0400 by Jerome Levers, RN  Safety Interventions:   low bed   fall reduction program maintained  Taken 05/16/2024 0200 by Jerome Levers, RN  Safety Interventions:   low bed   fall reduction program maintained  Taken 05/16/2024 0000 by Jerome Levers, RN  Safety Interventions:   low bed   fall reduction program maintained  Taken 05/15/2024 2200 by Jerome Levers, RN  Safety Interventions: fall reduction program maintained  Intervention: Prevent Skin Injury  Recent Flowsheet Documentation  Taken 05/16/2024 0400 by Jerome Levers, RN  Positioning for Skin: Supine/Back  Taken 05/16/2024 0200 by Jerome Levers, RN  Positioning for Skin: Supine/Back  Taken 05/16/2024 0000 by Jerome Levers, RN  Positioning for Skin:   Left   Supine/Back  Taken 05/15/2024 2200 by Jerome Levers, RN  Positioning for Skin: Supine/Back  Goal: Optimal Comfort and Wellbeing  Outcome: Shift Focus  Goal: Readiness for Transition of Care  Outcome: Shift Focus  Goal: Rounds/Family Conference  Outcome: Shift Focus     Problem: Infection  Goal: Absence of Infection Signs and Symptoms  Outcome: Shift Focus  Intervention: Prevent or Manage Infection  Recent Flowsheet Documentation  Taken 05/15/2024 2200 by Jerome Levers, RN  Isolation Precautions: contact precautions maintained     Problem: Fall Injury Risk  Goal: Absence of Fall and Fall-Related Injury  Intervention: Promote Injury-Free Environment  Recent Flowsheet Documentation  Taken 05/16/2024 0400 by Jerome Levers, RN  Safety Interventions:   low bed   fall reduction program maintained  Taken 05/16/2024 0200 by Jerome Levers, RN  Safety Interventions:   low bed   fall reduction program maintained  Taken 05/16/2024 0000 by Jerome Levers, RN  Safety Interventions:   low bed   fall reduction program maintained  Taken 05/15/2024 2200 by Jerome Levers, RN  Safety Interventions: fall reduction program maintained   Shift Summary  Pain management interventions were provided, resulting in a slight reduction in pain scores after medication administration.  Blood specimen collection and diagnostic imaging were completed to support ongoing assessment.  Safety and fall prevention measures were consistently implemented, including bed alarms and fall reduction protocols.  Mobility improved from standby assistance to independent status during the shift.  Overall, comfort and safety goals were supported and no new hospital-acquired complications were documented during the shift.    Absence of Hospital-Acquired Illness or Injury: Frequent repositioning and use of skin protection interventions were maintained throughout the shift, with no documentation of new skin breakdown or device-related complications noted.    Optimal Comfort and Wellbeing: Pain in the right lower extremity decreased slightly after administration of oxyCODONE  and acetaminophen , with pain scores dropping from 6 to 5 and comfort interventions provided as needed.    Readiness for Transition of Care: Assistance level improved from standby to independent during the shift, and safety interventions were consistently maintained to support mobility and prevent falls.    Absence of Infection Signs and Symptoms: Temperature remained within a narrow range and oral source was consistent; contact precautions were maintained and no acute changes in infection-related documentation were noted.

## 2024-05-16 NOTE — Unmapped (Signed)
 Shift Summary  CT imaging identified moderate subcutaneous obliteration and skin thickening over the right anterolateral ankle, which may be related to edema and/or cellulitis.   PRN oxyCODONE  was administered twice for right lower extremity pain, with a slight decrease in pain score noted.   Potassium chloride  was administered in the late morning.   Blood cultures drawn during the shift reported no growth at 48 hours.   Overall, pain management and infection monitoring were maintained, and discharge planning for tobacco cessation was initiated.     Absence of Hospital-Acquired Illness or Injury: No new hospital-acquired injuries or device-related skin issues were noted, with adhesive use limited and skin assessments remaining clean, dry, and intact throughout the shift.     Optimal Comfort and Wellbeing: Right lower extremity pain remained constant but decreased slightly from 6 to 5 after PRN oxyCODONE  administration; acetaminophen  was refused.     Readiness for Transition of Care: Discharge planning included messaging providers for outpatient nicotine  replacement therapy prescriptions, and the patient expressed intent to quit tobacco use as soon as possible.     Absence of Infection Signs and Symptoms: Temperature remained stable and blood cultures reported no growth at 48 hours; sepsis risk scores stayed low throughout the shift.

## 2024-05-16 NOTE — Unmapped (Signed)
 Medicine Daily Progress Note    Assessment/Plan:  Principal Problem:    Pain of right lower extremity  Active Problems:    Crohn's disease    (CMS-HCC)    Bipolar 1 disorder    (CMS-HCC)    Hx of deep venous thrombosis    Tobacco use disorder    Cannabinoid hyperemesis syndrome    Cocaine use         Jerome Cocuzza Jr. is a 48 y.o. male who is presenting to [facility] with RLE pain, redness, and swelling, primarily localized to the R ankle.       Acute pain of right ankle 2/2 cellulitis  Patient denies any recent trauma or injuries, but does have healing wound/ulceration. The initial impression was gout based on limited range of motion and marked tenderness of the right ankle; however, the patient has no significant risk factors and  Uric acid was low at 0.3 mg/dL, making gout less likely. The patient recently completed a course of outpatient antibiotic therapy with minimal improvement in symptoms. In the emergency department, he received vancomycin  and Zosyn . Given the low suspicion of gout and higher concern for bacterial cellulitis, the patient was started on oral cephalexin  (Keflex ). CT of RLE showed no acute fracture or osteomyelitis, but showed Mild degenerative changes and ossification at distal syndesmosis. Diffuse osteopenia with subcutaneous edema over anterolateral ankle.  - Follow-up PVL venous duplex of RLE   - start keflex  500mg  PO BID for cellulitis  - Scheduled Acetaminophen  and Oxycodone  PRN, no NSAIDS with IBD as below   - If after work-up, stronger suspicion for gout, may consider trial of Anakinra      Stricturing ileocolonic Crohns   Denies any GI symptomology aside from diarrhea and some mild RLQ abdominal discomfort. Denies any diarrhea or abdominal tenderness;  hgb is stable today at 10.9 (10/13).  Imaging overall stable it appears, but with a new area of inflammation around the neoterminal ileum. Unfortunately, he missed his recent outpatient GI follow-up on 04/19/2024.  He denies any transportation issues. Received IV Methylprednisolone  in ED, however holding on further steroids at this time. Ultimately, needs to start biologics (has failed numerous agents but suspect noncompliance to be the issue rather than true failure).  Scheduled to be on Tremfaya. GIPP negative. C diff negative. GI consulted, recommend f/u in clinic (10/24) no further IP work up recommended   -Appreciate GI [facility] consultation and recommendations  -Hold on further steroidal treatment at this time  -Daily CRP, CMP, CBC   -DVT prophylaxis as below  -Outpatient GI follow-up 05/27/2024     Irritant contact dermatitis  Noted erythematous rash of forearms bilaterally of unclear etiology. Patient denies any known exposure to irritants. Endorsing some pruritus, otherwise, not significantly bothersome. Low suspicion for superimposed infection.   - continue hydrocortisone 2.5% cream BID     Cannabinoid hyperemesis syndrome (improved)  Has smoked marijuana for quite some time and gets relief of symptoms with hot showers. Noted electrolyte derangements upon admission, now resolved with repletion. Denying any nausea or recent vomiting at time of exam today, 10/13. Reports he was unaware that marijuana/ delta cannabinoids use can cause nausea and vomiting and is open to quitting completely.  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  He is not on lifelong anticoagulation due to bleeding risk with IBD per hematology recommendations, though could possibly be reconsidered as his hgb is  stable, with prothrombic state of Crohn's disease.  -Follow-up PVL venous duplex of RLE as above  -Prophylactic Lovenox      Bipolar 1 disorder  -Continue Seroquel       COPD  Supposed diagnosis, unable to locate any previous PFT's, or imaging findings consistent with this diagnosis.  -Continue formulary substitute for home Symbicort       Prophylaxis  -Lovenox       Diet  -Nutrition Therapy Regular/House    Code Status / HCDM  -Full Code, Discussed with patient at the time of admission   -  HCDM (patient stated preference): Jerome Kennedy, Jerome Kennedy - Brother - [phone]    I personally spent 45 minutes face-to-face and non-face-to-face in the care of this patient, which includes all pre, intra, and post visit time on the date of service.  All documented time was specific to the E/M visit and does not include any procedures that may have been performed.     ___________________________________________________________________    Subjective:  No acute events noted overnight. Patient is calm and in no acute distress. Denies abdominal discomfort at time of examination. Reports increased tenderness and pain to palpation of the right ankle. States bowel movements are regular and denies increased nausea or vomiting.     Labs/Studies:  Labs and Studies from the last 24hrs per EMR and Reviewed    Objective:  Temp:  [36.3 ??C (97.3 ??F)-36.9 ??C (98.4 ??F)] 36.5 ??C (97.7 ??F)  Pulse:  [69-96] 74  Resp:  [14-18] 18  BP: (106-119)/(62-82) 119/81  SpO2:  [93 %-100 %] 100 %    General: Awake and alert. In no acute distress.  Skin: Warm, dry, and intact.   Head: Normocephalic.  Eyes: Conjunctivae clear without exudates or hemorrhage. Eyelids normal in appearance without swelling or lesions.  Cardiac: Heart rate and rhythm normal  Extremities: 2+ non-pitting edema with accopanying warmth and erythema of right ankle. Also with epidermal scaling. No purulence. Erythematous rash of bilateral forearms. See picture  Psychiatric: appropriate mood and affect  Neurological: Awake and alert with normal speech. No gross neurological deficits.   GI: Abdomen soft and symmetric without distention, bowel sounds present and normoactive in all four quadrants. No wincing or grimacing with gentle palpation. Umbilical protrusion noted.   Respiratory: chest wall symmetric and without deformity, no respiratory distress, lung sounds CTA.

## 2024-05-16 NOTE — Unmapped (Signed)
 Inpatient Tobacco Cessation Counseling Note    This medical encounter was conducted virtually using [email or web address]  TeleHealth protocols.    I have identified myself to the patient and conveyed my credentials to Jerome Kennedy  I have explained the capabilities and limitations of telemedicine and the patient/proxy and myself both agree that it is appropriate for their current circumstances/symptoms.     Contact Information  Person Contacted: Jerome Dollens Jr.         Contact Phone number: [phone]      Phone Outcome: spoke with pt  Is there someone else in the room? No.     Patient's location at the time of the telephone visit: Hospitalized at [facility]   Provider's location at the time of the telephone visit: At home, in [place]       Purpose of contact:     Pt participated in a telephone visit for tobacco cessation counseling.  Patient was admitted to hospital for Cellulitis of right lower extremity [L03.115]  Crohn's disease of small and large intestines with complication (CMS-HCC) [K50.819]. Patient consented to telephone visit given due to social isolation measures in place due to the COVID-19 pandemic.     Tobacco Use History and Assessment  Time Since Last Tobacco Use: 1 to 7 days ago  Tobacco Withdrawal (Past 24 Hours): None noted  Type of Tobacco Products Used: Cigarettes  Quantity Used: 10  Quantity Per: day  Other Household Members Use Tobacco: Lives alone  Medications Used in Past Attempts: Nicotine  Patch  Previously seen by NDP?: Hospital Inpatient    Behavioral Assessment  Why Uses: 1. habit 2. stress-relief  Reasons to Become Tobacco Free: health  Barriers/Challenges: 1. long-standing habit 2. stress  Strategies: pt can use 1. NRT to manage cravings 2. hand-to-mouth replacements    NOTE: Pt denies having cravings to smoke and initially states he smokes approx 4cpd. When asked how long his pack of cigarettes typically lasts him, pt responded 2 days. SW referenced conversation this Clinical research associate had with pt last month when he was in the hospital, at which time pt was reporting having 2cpd. Pt states he wants to quit but is finding it more difficult than he anticipated. Pt states he has nicotine  patches at home but has not been using them consistently. Pt reports when he has used the patch, he feels it does not sufficiently address cravings. SW reviewed with pt that the patch does not address the oral fixation/behavioral aspect of cigarette addiction. SW encouraged pt to try nicotine  gum/lozenge and other hand-to-mouth replacements to address this aspect of tobacco dependence. SW reviewed chew and park process associated with the gum and that gum/lozenge will be added prn. SW requested prescriptions for patch/gum/lozenge to go with pt and encouraged pt to use short and long-acting NRT to manage cravings. SW reviewed recommendation for pt to become tobacco-free to better manage Chron's symptoms and avoid future hospitalizations. SW provided pt with contact information, physical improvements related to tobacco cessation, and available resources (including outpatient Tobacco Treatment Program at [facility] and [place] [facility]).    Treatment Plan  Please see below for medication recommendations in bold.   Cessation Meds Currently Using: Patch 14mg   Medications Recommended During Hospitalization: Patch 14mg , Lozenge 4mg , Gum 4mg   Outpatient/Discharge Medications Recommended: Patch 14mg , Lozenge 4mg , Gum 4mg   Plan to Obtain Outpatient Meds: TTS messaged providers for Rx  Patient's Plan Post Discharge/Visit: Plan to quit as soon as possible    As part of this Telephone  Visit, no in-person exam was conducted.     I personally spent 13 minutes counseling the patient via telephone about tobacco cessation.  I spent an additional 9 minutes on pre- and post-visit activities.      The patient was physically located in [place]  or a state in which I am permitted to provide care. The patient and/or parent/guardian understood that s/he may incur co-pays and cost sharing, and agreed to the telemedicine visit. The visit was reasonable and appropriate under the circumstances given the patient's presentation at the time.     The patient and/or parent/guardian has been advised of the potential risks and limitations of this mode of treatment (including, but not limited to, the absence of in-person examination) and has agreed to be treated using telemedicine. The patient's/patient's family's questions regarding telemedicine have been answered.      If the visit was completed in an ambulatory setting, the patient and/or parent/guardian has also been advised to contact their provider???s office for worsening conditions, and seek emergency medical treatment and/or call 911 if the patient deems either necessary.     Visit Format/Coding: Telephone     Coding: 01032 (11-20 minutes)  Service rendered over the phone most consistent with: Tobacco cessation counseling, greater than 10 minutes (00592)      Mliss Fair, LCSW, NCTTP  Clinical Social Worker / Tobacco Treatment Specialist  [facility]  Phone: [phone]

## 2024-05-16 NOTE — Unmapped (Signed)
 [facility]   Care Management     Patient is a 48 y.o. admitted on 05/14/2024 for Cellulitis of right lower extremity [L03.115]  Crohn's disease of small and large intestines with complication (CMS-HCC) [K50.819]. Per review of the medical record and discussion with the treating team, the patient does not meet indicators for a full assessment at this time. CM will continue to assess for discharge needs and follow up, as indicated.    Sotero Smalls, RN May 16, 2024 9:06 AM      Food Insecurity: No Food Insecurity (05/16/2024)    Hunger Vital Sign     Worried About Running Out of Food in the Last Year: Never true     Ran Out of Food in the Last Year: Never true      Financial Resource Strain: Low Risk  (05/16/2024)    Overall Financial Resource Strain (CARDIA)     Difficulty of Paying Living Expenses: Not very hard     Housing: Low Risk  (05/16/2024)     Transportation Needs: No Transportation Needs (05/16/2024)    PRAPARE - Therapist, art (Medical): No     Lack of Transportation (Non-Medical): No

## 2024-05-16 NOTE — Unmapped (Signed)
 Jerome Wade Jr. is a 48 year old male with a history of stricturing ileocolonic Crohn's disease, bipolar 1 disorder, prior deep venous thrombosis, tobacco use disorder, and cocaine/marijuana use, admitted for evaluation and management of right lower extremity pain, redness, and swelling.    Problem-Oriented Hospitalization Summary    Pain, Redness, and Swelling of Right Lower Extremity (Cellulitis)  He presented with acute right ankle pain, marked erythema, swelling, and limited range of motion, with associated ulceration and chronic venous stasis changes. Initial suspicion for gout was low due to a uric acid level of 0.3 mg/dL and lack of typical risk factors; recent outpatient antibiotics (levofloxacin) provided minimal improvement. In the ED, he received vancomycin  and Zosyn , but the working diagnosis shifted to bacterial cellulitis, and he was started on oral cephalexin . CT of the right lower extremity showed mild degenerative changes, ossification at the distal syndesmosis, diffuse osteopenia, and subcutaneous edema without evidence of fracture or osteomyelitis. Blood cultures remained negative at 48 hours, and temperature was stable throughout hospitalization. Pain was managed with scheduled acetaminophen  and PRN oxycodone  and hydromorphone , with some reduction in pain scores. He remained afebrile and without systemic signs of infection. Venous duplex of the right lower extremity was planned for further evaluation,however, pt preferred discharge prior to this test being complete. He was discharged on keflex  500mg  BID x7 days to treat for cellulitis.     Crohn's Disease (Stricturing Ileocolonic)  He has a long-standing history of Crohn's disease with multiple prior surgeries and recent flares. Imaging during this admission showed stable findings with a new area of inflammation at the neoterminal ileum, but no acute exacerbation requiring inpatient steroid therapy. He received a single dose of IV methylprednisolone  in the ED, but further steroids were held pending infectious workup, which was negative for C. difficile and GI pathogens. His hemoglobin remained stable, and GI recommended outpatient follow-up and continuation of Tremfya  (guselkumab ). He missed a prior GI appointment but is scheduled for follow-up on 05/27/2024.    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 Prophylactic Lovenox  was administered during hospitalization, and venous duplex of the right lower extremity was planned for further evaluation, however, pt declined to have this test completed prior to discharge. Low suspicion for DVT.     Bipolar 1 Disorder  His bipolar disorder was managed with continuation of home Seroquel  during hospitalization.    Cannabinoid Hyperemesis Syndrome  He has a history of cannabinoid hyperemesis syndrome, with symptom relief from hot showers and prior episodes of vomiting and hypokalemia. Electrolyte derangements were repleted, and he denied nausea or vomiting during the latter part of hospitalization. He received counseling on marijuana cessation and was open to quitting.    Tobacco Use Disorder  He has a long-standing history of tobacco use, with recent attempts to quit using nicotine  patches, but inconsistent use and ongoing cravings. He received inpatient tobacco cessation counseling and was provided with recommendations for nicotine  patch, gum, and lozenge, as well as outpatient resources and prescriptions for nicotine  replacement therapy. He expressed intent to quit tobacco use as soon as possible.    Cocaine Use  He has a history of cocaine use, which was noted but did not require acute intervention during this hospitalization.    Other Notable Findings  He was noted to have an erythematous rash of the forearms, suspected irritant contact dermatitis, managed with  topical hydrocortisone cream. No evidence of superimposed infection was found.    Discharge Planning  He was discharged home with self-care, with outpatient follow-up arranged for Crohn's disease and ongoing tobacco cessation support. No new hospital-acquired injuries or complications were documented during the admission.

## 2024-05-16 NOTE — Unmapped (Signed)
 [facility]astro[facility]ology Consult Service   Progress Note         Assessment and Recommendations:   Jerome Saldivar. is a [[place]] 48 y.o. 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    Patient's symptoms of nausea vomiting, abdominal pain have all resolved.  C. difficile, GI pathogen panel all negative.  Patient does have evidence of active inflammation of the small bowel from prior evaluation, but there is no acute change or exacerbation that would warrant inpatient steroid therapy. His main issue is the right lower extremity cellulitis, which is likely causing the increase in the CRP.  From a GI standpoint, patient can be discharged. He is getting Tremfya  as an outpatient with further plans to follow-up with Anette Arlean Pike in IBD clinic.     Recommendations:  Follow-up with Anette Arlean Pike 05/27/24  Continue home guselkumab  (Tremfya )  No further GI workup indicated at this time    Issues Impacting Complexity of Management:  -None    Recommendations discussed with the patient's primary team. We will sign-off at this time, please re-contact if additional questions or a new need for consultation arises.    For questions, contact the on-call fellow for the Gastroenterology ([facility]) Consult Service.    Subjective:   All of patient's nausea vomiting, abdominal pain is all resolved.  Eating without issues the only thing that is bothering him is his left lower extremity.    Objective:   Temp:  [36.3 ??C (97.3 ??F)-36.9 ??C (98.4 ??F)] 36.5 ??C (97.7 ??F)  Pulse:  [69-96] 74  Resp:  [14-18] 18  BP: (106-119)/(62-82) 119/81  SpO2:  [93 %-100 %] 100 %    Gen: Chronically ill-appearing in NAD, answers questions appropriately, eating a full meal  Abdomen: Soft, non-distended, no TTP, no rebound/guarding  Extremities: No edema in the BLEs    Pertinent Labs/Studies:  -I have reviewed the patient's labs from 05/16/24 which show down-trending Hgb, stable renal function (SCr, electrolytes), improving LFTs, and improving CRP

## 2024-05-16 NOTE — Unmapped (Signed)
 Physician Discharge Summary [facility]  1 [facility] OBSERVATION [facility]  [address]  [place] [address]  Dept: [phone]  Loc: [phone]     Identifying Information:   Jerome Kennedy  [birth year]  [id number]    Primary Care Physician: Pcp, None Per Patient   Code Status: Full Code    Admit Date: 05/14/2024    Discharge Date: 05/16/2024     Discharge To: Home    Discharge Service: [facility] - Hospitalist [place] APP     Discharge Attending Physician: No att. providers found    Discharge Diagnoses:  Principal Problem:    Pain of right lower extremity (POA: Yes)  Active Problems:    Crohn's disease    (CMS-HCC) (POA: Yes)    Bipolar 1 disorder    (CMS-HCC) (POA: Yes)    Hx of deep venous thrombosis (POA: Not Applicable)    Tobacco use disorder (POA: Yes)    Cannabinoid hyperemesis syndrome (POA: Yes)    Cocaine use (POA: Yes)  Resolved Problems:    * No resolved hospital problems. *      Outpatient Provider Follow Up Issues:   Treating cellulitis of ankle  F/u scheduled with GI    Hospital Course:   Jerome Herendeen Jr. is a 48 year old male with a history of stricturing ileocolonic Crohn's disease, bipolar 1 disorder, prior deep venous thrombosis, tobacco use disorder, and cocaine/marijuana use, admitted for evaluation and management of right lower extremity pain, redness, and swelling.    Problem-Oriented Hospitalization Summary    Pain, Redness, and Swelling of Right Lower Extremity (Cellulitis)  He presented with acute right ankle pain, marked erythema, swelling, and limited range of motion, with associated ulceration and chronic venous stasis changes. Initial suspicion for gout was low due to a uric acid level of 0.3 mg/dL and lack of typical risk factors; recent outpatient antibiotics (levofloxacin) provided minimal improvement. In the ED, he received vancomycin  and Zosyn , but the working diagnosis shifted to bacterial cellulitis, and he was started on oral cephalexin . CT of the right lower extremity showed mild degenerative changes, ossification at the distal syndesmosis, diffuse osteopenia, and subcutaneous edema without evidence of fracture or osteomyelitis. Blood cultures remained negative at 48 hours, and temperature was stable throughout hospitalization. Pain was managed with scheduled acetaminophen  and PRN oxycodone  and hydromorphone , with some reduction in pain scores. He remained afebrile and without systemic signs of infection. Venous duplex of the right lower extremity was planned for further evaluation,however, pt preferred discharge prior to this test being complete. He was discharged on keflex  500mg  BID x7 days to treat for cellulitis.     Crohn's Disease (Stricturing Ileocolonic)  He has a long-standing history of Crohn's disease with multiple prior surgeries and recent flares. Imaging during this admission showed stable findings with a new area of inflammation at the neoterminal ileum, but no acute exacerbation requiring inpatient steroid therapy. He received a single dose of IV methylprednisolone  in the ED, but further steroids were held pending infectious workup, which was negative for C. difficile and GI pathogens. His hemoglobin remained stable, and GI recommended outpatient follow-up and continuation of Tremfya  (guselkumab ). He missed a prior GI appointment but is scheduled for follow-up on 05/27/2024.    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 Prophylactic Lovenox  was administered during hospitalization, and venous duplex of the right lower  extremity was planned for further evaluation, however, pt declined to have this test completed prior to discharge. Low suspicion for DVT.     Bipolar 1 Disorder  His bipolar disorder was managed with continuation of home Seroquel  during hospitalization.    Cannabinoid Hyperemesis Syndrome  He has a history of cannabinoid hyperemesis syndrome, with symptom relief from hot showers and prior episodes of vomiting and hypokalemia. Electrolyte derangements were repleted, and he denied nausea or vomiting during the latter part of hospitalization. He received counseling on marijuana cessation and was open to quitting.    Tobacco Use Disorder  He has a long-standing history of tobacco use, with recent attempts to quit using nicotine  patches, but inconsistent use and ongoing cravings. He received inpatient tobacco cessation counseling and was provided with recommendations for nicotine  patch, gum, and lozenge, as well as outpatient resources and prescriptions for nicotine  replacement therapy. He expressed intent to quit tobacco use as soon as possible.    Cocaine Use  He has a history of cocaine use, which was noted but did not require acute intervention during this hospitalization.    Other Notable Findings  He was noted to have an erythematous rash of the forearms, suspected irritant contact dermatitis, managed with topical hydrocortisone cream. No evidence of superimposed infection was found.    Discharge Planning  He was discharged home with self-care, with outpatient follow-up arranged for Crohn's disease and ongoing tobacco cessation support. No new hospital-acquired injuries or complications were documented during the admission.     Procedures:    No admission procedures for hospital encounter.  ______________________________________________________________________  Discharge Medications:     Your Medication List        STOP taking these medications      levoFLOXacin 500 MG tablet  Commonly known as: LEVAQUIN            START taking these medications      acetaminophen  500 MG tablet  Commonly known as: TYLENOL   Take 2 tablets (1,000 mg total) by mouth every eight (8) hours.     cephalexin  500 MG capsule  Commonly known as: KEFLEX   Take 1 capsule (500 mg total) by mouth every twelve (12) hours for 7 days.     hydrocortisone 2.5 % cream  Apply topically two (2) times a day.     oxyCODONE  5 MG immediate release tablet  Commonly known as: ROXICODONE   Take 1 tablet (5 mg total) by mouth every four (4) hours as needed for up to 5 days.            CONTINUE taking these medications      empty container Misc  Use as directed     ergocalciferol-1,250 mcg (50,000 unit) 1,250 mcg (50,000 unit) capsule  Commonly known as: DRISDOL  Take 1 capsule (1,250 mcg total) by mouth once a week.     ferrous sulfate  325 (65 FE) MG tablet  Take 1 tablet (325 mg total) by mouth in the morning.     FLONASE  ALLERGY RELIEF 50 mcg/actuation nasal spray  Generic drug: fluticasone  propionate  2 sprays into each nostril daily as needed.     nicotine  14 mg/24 hr patch  Commonly known as: NICODERM CQ   Place 1 patch on the skin daily.     nicotine  polacrilex 2 mg gum  Commonly known as: NICORETTE  Apply 1 each (2 mg total) to cheek every hour as needed for smoking cessation.     nicotine  polacrilex 4 MG lozenge  Commonly  known as: NICORETTE  Apply 1 lozenge (4 mg total) to cheek every hour as needed for smoking cessation.     ondansetron  4 MG disintegrating tablet  Commonly known as: ZOFRAN -ODT  Take 1 tablet (4 mg total) by mouth every eight (8) hours as needed for nausea for up to 7 days.     OPTICHAMBER DIAMOND VHC Spcr  Generic drug: inhalational spacing device     QUEtiapine  100 MG tablet  Commonly known as: SEROQUEL   Take 1 tablet (100 mg total) by mouth nightly.     SYMBICORT  80-4.5 mcg/actuation inhaler  Generic drug: budesonide -formoterol   Inhale 2 puffs  in the morning.     TREMFYA  PEN 200 mg/2 mL Pnij  Generic drug: guselkumab   Inject the contents of 1 pen (200 mg) under the skin every twenty-eight (28) days.     VENTOLIN  HFA 90 mcg/actuation inhaler  Generic drug: albuterol   Inhale 2 puffs every six (6) hours as needed.              Allergies:  Morphine  and Toradol [ketorolac]  ______________________________________________________________________  Pending Test Results (if blank, then none):  Pending Labs       Order Current Status    Blood Culture #1 Preliminary result    Blood Culture #2 Preliminary result            Most Recent Labs:  All lab results last 24 hours -   Recent Results (from the past 24 hours)   CBC    Collection Time: 05/16/24  3:59 AM   Result Value Ref Range    WBC 3.8 3.6 - 11.2 10*9/L    RBC 3.73 (L) 4.26 - 5.60 10*12/L    HGB 10.9 (L) 12.9 - 16.5 g/dL    HCT 66.9 (L) 60.9 - 48.0 %    MCV 88.5 77.6 - 95.7 fL    MCH 29.2 25.9 - 32.4 pg    MCHC 33.0 32.0 - 36.0 g/dL    RDW 85.1 87.7 - 84.7 %    MPV 8.6 6.8 - 10.7 fL    Platelet 168 150 - 450 10*9/L   C-reactive protein    Collection Time: 05/16/24  3:59 AM   Result Value Ref Range    CRP 40.5 (H) <=10.0 mg/L   Comprehensive Metabolic Panel    Collection Time: 05/16/24  3:59 AM   Result Value Ref Range    Sodium 143 135 - 145 mmol/L    Potassium 3.2 (L) 3.4 - 4.8 mmol/L    Chloride 111 (H) 98 - 107 mmol/L    CO2 23.0 20.0 - 31.0 mmol/L    Anion Gap 9 5 - 14 mmol/L    BUN <5 (L) 9 - 23 mg/dL    Creatinine 9.13 9.26 - 1.18 mg/dL    eGFR CKD-EPI (7978) Male >90 >=60 mL/min/1.60m2    Glucose 101 70 - 179 mg/dL    Calcium  7.5 (L) 8.7 - 10.4 mg/dL    Albumin 2.4 (L) 3.4 - 5.0 g/dL    Total Protein 5.3 (L) 5.7 - 8.2 g/dL    Total Bilirubin <9.7 (L) 0.3 - 1.2 mg/dL    AST <8 <=65 U/L    ALT <7 (L) 10 - 49 U/L    Alkaline Phosphatase 105 46 - 116 U/L   Sedimentation rate, manual    Collection Time: 05/16/24  3:59 AM   Result Value Ref Range    Sed Rate 12 0 - 15 mm/h  Relevant Studies/Radiology (if blank, then none):  CT Lower Extremity Right Wo Contrast  Result Date: 05/16/2024  EXAM: CT LOWER EXTREMITY RIGHT WO CONTRAST DATE: 05/15/2024 8:30 PM ACCESSION: [id number] UN DICTATED: 05/15/2024 10:30 PM INTERPRETATION LOCATION: MAIN CAMPUS CLINICAL INDICATION: 48 years old Male with Severe pain, edema, and warmth of right ankle. No known trauma or injury. XR relatively unremarkable, r/o deeper infection. COMPARISON: right ankle radiographs 05/14/2024 TECHNIQUE: An axially-acquired helical CT scan of the right ankle was obtained without administration of IV contrast. Transverse, coronal and sagittal images were reconstructed at 3-mm increments. FINDINGS: Diffuse osteopenia. No acute fracture. Joint alignment is anatomic. Joint spaces are preserved. Degenerative marginal osteophytes at the dorsal first TMT joint and Lisfranc joint. Mild ossification at the distal tibiofibular syndesmosis, which may be secondary to prior trauma or syndesmotic sprain. Moderate subcutaneous obliteration and skin thickening overlying the anterolateral ankle at the level of the distal fibula.     Moderate subcutaneous obliteration and skin thickening overlying the anterolateral ankle at the level of the distal fibula, which may be related to edema and/or cellulitis in the proper clinical setting.     ECG 12 Lead  Result Date: 05/15/2024  NORMAL SINUS RHYTHM NON-SPECIFIC ST/T WAVE CHANGES ABNORMAL ECG WHEN COMPARED WITH ECG OF 15-Mar-2024 17:13, SEPTAL INFARCT  IS NOW PRESENT Confirmed by Cleotilde Moccasin [phone]) on 05/15/2024 1:17:33 PM    CT Abdomen Pelvis W IV Contrast  Result Date: 05/14/2024  EXAM: CT ABDOMEN PELVIS W CONTRAST ACCESSION: [id number] UN REPORT DATE: 05/14/2024 3:31 PM CLINICAL INDICATION: 48 years old with extense RLQ pain, hx of extensive crohns  COMPARISON: CT ABDOMEN PELVIS W CONTRAST 04/07/2024 TECHNIQUE: A helical CT scan of the abdomen and pelvis was obtained following IV contrast from the lung bases through the pubic symphysis. Images were reconstructed in the axial plane. Coronal and sagittal reformatted images were also provided for further evaluation. FINDINGS: LOWER CHEST: Mild bibasilar atelectasis. No pleural effusion. LIVER: Normal liver contour.  No focal liver lesions. BILIARY: The gallbladder is surgically absent. No biliary ductal dilatation.  SPLEEN: Normal in size and contour. PANCREAS: Normal pancreatic contour.  No focal lesions.  No ductal dilation. ADRENAL GLANDS: Normal appearance of the adrenal glands. KIDNEYS/URETERS: Symmetric renal enhancement.  No hydronephrosis.  No solid renal mass. Subcentimeter left anterior mid kidney hypodensity, too small to characterize but unchanged from prior (2:44). BLADDER: Unremarkable. REPRODUCTIVE ORGANS: Unremarkable. GI TRACT: Postsurgical changes from right hemicolectomy and ileocecectomy with ileocolic anastomosis visualized in the right upper quadrant. The stomach is distended with internal debris. Duodenum is normal in course and caliber. Decreased bowel dilation of the ileum within the upper abdomen with increased conspicuity of bowel wall thickening and mural enhancement involving the neoterminal ileum just proximal to the ileocolic anastomosis (2:57-67), spanning approximately 25 cm. The more proximal ileum and jejunum are not dilated. Increased protrusion of loops of gas-filled large bowel through the ventral abdominal wall hernia. Similar to decreased wall thickening of the distal rectum with decreased immediately upstream rectal dilation though overall similar colonic dilation. PERITONEUM, RETROPERITONEUM AND MESENTERY: No free air.  No ascites.  No fluid collection. LYMPH NODES: Multiple prominent central mesenteric abdominal nodes, similar to prior and likely reactive. VESSELS: Limited evaluation of hepatic vein patency given contrast bolus timing. The portal veins are patent.  Normal caliber aorta. Prominent subcutaneous varices identified throughout the abdominal wall, left greater than right. Enlargement of the azygos and hemiazygos, stable from prior. Collateralization also identified throughout the paraspinal musculature. BONES and SOFT TISSUES:  Moderate degenerative disc disease with mild retrolisthesis at L4-L5. Rectus diastases with extensive postsurgical changes along the anterior abdominal wall. Increased protrusion of gas-filled large bowel through the moderate-sized ventral abdominal wall hernia.     Compared to 04/07/2024, --New inflammatory changes of the neoterminal ileum proximal to the ileocolonic anastomosis with decreased associated small bowel dilation, suspicious for active perianastomotic Crohn's disease. --Similar to decreased wall thickening of the distal rectum with decreased immediately upstream rectal dilation though overall similar colonic dilation. --Increased protrusion of gas-filled loops of large bowel through the moderate-sized ventral abdominal wall hernia without evidence of strangulation or obstruction.     XR Ankle 3 or More Views Right  Result Date: 05/14/2024  EXAM: XR ANKLE 3 OR MORE VIEWS RIGHT DATE: 05/14/2024 12:06 PM ACCESSION: [id number] UN DICTATED: 05/14/2024 12:24 PM INTERPRETATION LOCATION: Main Campus CLINICAL INDICATION: 48 years old Male with cellulitis refractory to antibiotics, r/o osteo  COMPARISON: None. TECHNIQUE: AP, oblique and lateral views of the right ankle. FINDINGS: No evidence of acute fracture. Joints are approximated. No focal osseous lesions. No osseous erosions. Mild diffuse soft tissue swelling in lower leg. No tracking soft tissue gas.     No acute fracture. No radiographic evidence of acute osteomyelitis. No tracking soft tissue gas.    ______________________________________________________________________  Discharge Instructions:   Activity Instructions       Activity as tolerated              Diet Instructions       Discharge diet (specify)      Discharge Nutrition Therapy: Regular            Other Instructions       Call MD for:      Worsening warmth, or increased drainage from ankle wound    Call MD for:  persistent nausea or vomiting      Call MD for:  redness, tenderness, or signs of infection (pain, swelling, redness, odor or green/yellow discharge around incision site)      Call MD for:  severe uncontrolled pain      Call MD for: Temperature > 38.5 Celsius ( > 101.3 Fahrenheit)      Discharge instructions      You were admitted to the hospital with acute pain and swelling of R ankle. CT scan of that ankle does not show any fractures or osteomyelitis (infection in the bone) We suspect the swelling, warmth and tenderness are from cellulitis, which is an infection in the skin and soft tissues. We've started you on keflex  twice a day for 7 days to treat this infection. You can continue to take tylenol  for pain  and oxycodone  as needed for more severe pain.   GI was consulted regarding your crohn's disease. They did not think inpatient colonoscopy was needed, and recommended you continue your home tremfya  and follow up with your OP GI doctor, Dr Cheyenne as scheduled on 10/24.    Discharge instructions      Follow up with your primary care provider as needed. If lower extremity wound does not improve, or swelling or redness get worse, return to the hospital or call your provider.            Follow Up instructions and Outpatient Referrals     Call MD for:      Call MD for:  persistent nausea or vomiting      Call MD for:  redness, tenderness, or signs of infection (pain, swelling,   redness, odor or green/yellow  discharge around incision site)      Call MD for:  severe uncontrolled pain      Call MD for: Temperature > 38.5 Celsius ( > 101.3 Fahrenheit)      Discharge instructions      Discharge instructions          Appointments which have been scheduled for you      May 18, 2024 9:00 AM  (Arrive by 8:45 AM)  DIAGNOSTICS with [facility] PROSTHO PROVIDER  [facility] Dentistry Prosthodontics [facility] REGION) [address]  [address]  [place] [address]  [phone]        May 27, 2024 11:30 AM  (Arrive by 11:15 AM)  RETURN IBD with Anette Arlean Pike, APRN  [facility] MEDICINE [place] [facility] REGION) [address]  [facility] [address]  [place] [address]  [phone] ______________________________________________________________________  Discharge Day Services:  BP 128/91  - Pulse 83  - Temp 37 ??C (98.6 ??F) (Oral)  - Resp 16  - Ht 172.7 cm (5' 8)  - Wt 70.4 kg (155 lb 3.3 oz)  - SpO2 98%  - BMI 23.60 kg/m??   Pt seen on the day of discharge and determined appropriate for discharge.    Condition at Discharge: good    Length of Discharge: I spent greater than 30 mins in the discharge of this patient.

## 2024-05-25 NOTE — Unmapped (Signed)
 [facility] Delivery Pharm[facility] Refill Coordination Note    Specialty Medication(s) to be Shipped:   Inflammatory Disorders: Tremfya     Other medication(s) to be shipped: No additional medications requested for fill at this time    Specialty Medications not needed at this time: N/A     Jerome Ventrella., DOB: 31-Jul-1976  Phone: [phone] (home)       All above HIPAA information was verified with patient.     Was a Nurse, learning disability used for this call? No    Completed refill call assessment today to schedule patient's medication shipment from the [facility]  [phone]).  All relevant notes have been reviewed.     Specialty medication(s) and dose(s) confirmed: Regimen is correct and unchanged.   Changes to medications: Husam reports no changes at this time.  Changes to insurance: No  New side effects reported not previously addressed with a pharmacist or physician: None reported  Questions for the pharmacist: No    Confirmed patient received a Conservation officer, historic buildings and a Surveyor, mining with first shipment. The patient will receive a drug information handout for each medication shipped and additional FDA Medication Guides as required.       DISEASE/MEDICATION-SPECIFIC INFORMATION        For patients on injectable medications: Next injection is scheduled for 10/29.    SPECIALTY MEDICATION ADHERENCE     Medication Adherence    Specialty Medication: TREMFYA  PEN 200 mg/2 mL  Patient is on additional specialty medications: No              Were doses missed due to medication being on hold? No    Tremfya  200/2 mg/ml: 0 doses of medicine on hand        REFERRAL TO PHARMACIST     Referral to the pharmacist: Not needed      [facility]     Shipping address confirmed in Epic.     Cost and Payment: Pt unable to pay $4 copay    Delivery Scheduled: Yes, Expected medication delivery date: 05/27/24.     Medication will be delivered via Same Day Courier to the prescription address in Epic [place].    Jerome Kennedy   [facility]  Specialty Technician

## 2024-05-26 NOTE — Progress Notes (Deleted)
 [facility]  CONSULT NOTE - INFLAMMATORY BOWEL DISEASE  05/26/2024    Demographics:  Jerome Kennedy. is a 48 y.o. year old male    Referring physician:   Pcp, None Per Patient  [address] [place],  [place] [address]          HPI / NOTE :     Today, I saw Jerome Kennedy. for follow up consultation in the [facility] regarding Crohn's Disease.  Outside records including available clinical notes, endoscopy reports, imaging results and pathology results were reviewed in detail as part of this initial consultation.     HPI:        Review of Systems: positive for the above   Otherwise, the balance of 10 systems is negative.          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   10/2019 s/p ileostomy takedown and reanastomosis   12/2019 initial visit at [facility], start Stelara due to difficulty with PIV (denied by insurance)  01/2020 restarted Humira   04/2022 hospital admission for C Diff infection  06/10/22 initial visit [facility]; continue humira , check Calpro   07/2022 inpatient admission for outpatient labs showing hypocalcemia, CT with long segment circumferential bowel wall thickening and hyperenhancement of the neoterminal ileum extending through the anastomosis of the proximal colon which was increased from prior CT scan along with luminal narrowing of this long segment of terminal ileum with focal proximal dilation concerning for stricture. Continue weekly humira  (had been increased 1 week prior to hospital admission).   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 Advised no steroids and continue Humira  on discharge  11/2023 multiple hospitalizations for abd pain, left AMA on 4/14 and again left AMA on 4/20. Re-admitted 4/23 abd underwent colonoscopy 11/30/23 which showed ongoing ileal disease in the 10 cm of neo-TI examined, Rutgeerts i3, colon otherwise normal. Switch therapy to to Rinvoq    12/2023 hospital admission for inadequate treatment with Rinvoq , saw tablets in his stool so plan to switch to Skyrizi   01/2024 unable to be place PIV during his first infusion of Skyrizi so switched therapy to Tremfya .   02/2024 hospital admission for possible Crohn's flare, treated with steroids and supportive care  04/2024 hospital admission for poorly controlled Crohn's missed the July Tremfya  dose due to cost and had difficulty administering the most recent dose on 04/06/24. Having a course of prednisone  in-between.  05/2024 hospital admission for bacterial cellulitis, CT negative for osteomyelitis, treated with antibiotics.       Endoscopy:         04/2024 Colonoscopy  Impression:- Preparation of the colon was poor.                          -  Hemorrhoids found on perianal exam.                          - Crohn's disease with ileitis. Rutgeerts Score i3- i4.                          - Normal mucosa in the entire examined colon.                          - Dilated in the rectum, in the sigmoid colon, in the descending   colon and in the transverse colon.                          - The examination was otherwise normal on direct and   retroflexion views.                          - Stool in the entire examined colon.     11/2023 Colonoscopy  Impression:- Preparation of the colon was inadequate.                          - Stool in the entire examined colon.                          - Patent end-to-side ileo-colonic anastomosis, characterized by   only very mild narrowing.                          - Simple Endoscopic Score for Crohn's Disease: 7, mucosal   inflammatory changes secondary to Crohn's disease with ileitis.   Rutgeerts i3. No colonic involvement on today's exam.     09/2022 Colonoscopy  Impression:- Stool in the entire examined colon.                          - Simple Endoscopic Score for Crohn's Disease: 0, mucosal   inflammatory changes secondary to Crohn's disease, in   remission.     10/2019 Colonoscopy  Impression:     - Preparation of the colon was inadequate. Unable to advance   scope (adult gastroscope) beyond 40 cm due to restricted   colonic mobility/angulations suspected 2/2 adhesions.                          - Mild diversion colitis in the rectum, otherwise normal colon to   max extent.                          - Internal hemorrhoids.                          - The examination was otherwise normal on direct and   retroflexion views.     03/2015 Colonoscopy  Impression:     - Normal mucosa in the rectum, in the sigmoid colon, in the   descending colon, in the transverse colon and in the ascending   colon.                          - Non-patent surgical anastomosis, characterized  by   congestion, friable mucosa, ulceration and severe stenosis.   Rutgeerts i4.                          - Internal hemorrhoids.     01/2013 Colonoscopy  Impression:- Preparation of the colon was poor.                          - Non-patent surgical anastomosis, with severe stenosis and   ulceration. This could not be traversed. Is consistent with   Rutgeerts I4.     09/2004 Colonoscopy  Impression:     - The colon is normal.                        - Ileo-colonic anastomosis in the proximal ascending colon.                        - Multiple ulcers in the terminal ileum consistent with Ileal Crohn's   disease. No colonic Crohn's disease seen.      Imaging:       05/2024 CT abdomen   Impression: - New inflammatory changes of the neoterminal ileum proximal   to the ileocolonic anastomosis with decreased associated small bowel dilation, suspicious for active perianastomotic Crohn's disease.   - Similar to decreased wall thickening of the distal rectum with decreased immediately upstream rectal dilation though overall similar colonic dilation   - Increased protrusion of gas-filled loops of large bowel through the moderate-sized ventral abdominal wall hernia without evidence of strangulation or obstruction.     Prior IBD medications (type, dose, duration, response):     Remicade - failed  Humira  - loss of response          Past Medical History:   Past medical history: Past Medical History[1]  Past surgical history: Past Surgical History[2]  Family history: Family History[3]  Social history: Social History[4]          Allergies:   Allergies[5]          Medications:   Current Medications[6]          Physical Exam:   There were no vitals taken for this visit.    GEN: no apparent distress, appears comfortable on exam  HEENT: PEERL, OP clear with no erythema, lesions, exudate, mucous membranes moist  NEURO:  gait normal, non-focal, no obvious neurologic abnormality  NECK: Supple, no lymphadenopathy  LUNGS: CTAB, no wheezes, rales, or rhonchi  CV: S1/S2, RRR, no murmurs  ABD: Soft, nontender, nondistended, normoactive bowel sounds, no rebound/guarding, no appreciable organomegaly  Extremities: no cyanosis, clubbing or edema, normal gait  Psych: affect appropriate, A&O x3  SKIN: no visible lesions on face, neck, arms, abdomen  PERIANAL / RECTAL EXAM:   Deferred           Labs, Data & Indices:     Lab Review:   Lab Results   Component Value Date    WBC 3.8 05/16/2024    WBC 8.1 01/06/2015    WBC 8.1 01/06/2015    RBC 3.73 (L) 05/16/2024    RBC 4.96 01/06/2015    HGB 10.9 (L) 05/16/2024    Hemoglobin 11.5 (L) 07/20/2015     Lab Results   Component Value Date    AST <8 05/16/2024    AST 21 01/05/2013    ALT <7 (L) 05/16/2024  ALT 28 01/24/2013 BUN <5 (L) 05/16/2024    BUN 12 03/23/2013    Creatinine 0.86 05/16/2024    Creatinine 1.05 01/06/2015    CO2 23.0 05/16/2024    CO2 23.6 01/06/2015    Albumin 2.4 (L) 05/16/2024    Albumin 3.2 (L) 01/06/2015    Calcium  7.5 (L) 05/16/2024    Calcium  8.4 01/06/2015     No results found for: TSH     ............................................................................................................................................SABRA          Assessment & Recommendations:     Jerome Kennedy. is a 48 y.o. male who presents for ***.    PLAN:        --------------------------------------------  [facility]  Multidisciplinary Inflammatory Bowel Disease Clinic         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    Myalgia     Rhinovirus infection 10/29/2022    SBO (small bowel obstruction) (CMS-HCC) 2011   [2]   Past Surgical History:  Procedure Laterality Date    CHOLECYSTECTOMY      COLON SURGERY      partial resection    HERNIA REPAIR      PR ARTHRP INTERCARPAL/CARP/MTCRPL JT INTERPOSITION Left 08/07/2022    Procedure: INTERPOSIT ARTHROPLASTY-INTERCARPAL/CARPOMETACAR;  Surgeon: Bonifacio Cordella HERO, MD;  Location: MAIN OR [facility];  Service: Orthopedics    PR CLOSE ENTEROSTOMY,RESEC+COLOREC ANAS Midline 11/01/2019    Procedure: CLO ENTEROSTOMY; W/RESECT AUGIE FLAKES;  Surgeon: Aloysius Euell Quale, MD;  Location: MAIN OR [place];  Service: Gastrointestinal    PR COLONOSCOPY FLX DX W/COLLJ SPEC WHEN PFRMD Left 01/07/2013    Procedure: COLONOSCOPY, FLEXIBLE, PROXIMAL TO SPLENIC FLEXURE; DIAGNOSTIC, W/WO COLLECTION SPECIMEN BY BRUSH OR WASH;  Surgeon: Liliane JENEANE Marker, MD;  Location: GI PROCEDURES [facility];  Service: Gastroenterology    PR COLONOSCOPY FLX DX W/COLLJ SPEC WHEN PFRMD  03/09/2015    Procedure: COLONOSCOPY, FLEXIBLE, PROXIMAL TO SPLENIC FLEXURE; DIAGNOSTIC, W/WO COLLECTION SPECIMEN BY BRUSH OR WASH;  Surgeon: Artist Jayson Leather, MD;  Location: GI PROCEDURES [facility];  Service: Gastroenterology    PR COLONOSCOPY FLX DX W/COLLJ SPEC WHEN PFRMD N/A 10/28/2019    Procedure: COLONOSCOPY, FLEXIBLE, PROXIMAL TO SPLENIC FLEXURE; DIAGNOSTIC, W/WO COLLECTION SPECIMEN BY BRUSH OR WASH;  Surgeon: Derick Bring, MD;  Location: GI PROC[facility]URES [facility];  Service: Gastroenterology    PR COLONOSCOPY FLX DX W/COLLJ SPEC WHEN PFRMD N/A 10/01/2022    Procedure: COLONOSCOPY, FLEXIBLE, PROXIMAL TO SPLENIC FLEXURE; DIAGNOSTIC, W/WO COLLECTION SPECIMEN BY BRUSH OR WASH;  Surgeon: Darrick Peck, MD;  Location: GI PROC[facility]URES [facility];  Service: Gastroenterology    PR COLONOSCOPY FLX DX W/COLLJ SPEC WHEN PFRMD N/A 11/30/2023    Procedure: COLONOSCOPY, FLEXIBLE, PROXIMAL TO SPLENIC FLEXURE; DIAGNOSTIC, W/WO COLLECTION SPECIMEN BY BRUSH OR WASH;  Surgeon: Audrey Lacinda Hun, MD;  Location: GI PROC[facility]URES [facility];  Service: Gastroenterology    PR COLONOSCOPY W/BIOPSY SINGLE/MULTIPLE N/A 04/08/2024  Procedure: COLONOSCOPY, FLEXIBLE, PROXIMAL TO SPLENIC FLEXURE; WITH BIOPSY, SINGLE OR MULTIPLE;  Surgeon: Celestina Comer Norris, MD;  Location: GI [facility]URES [facility];  Service: Gastroenterology    PR EXPLORATORY OF ABDOMEN Midline 09/02/2019    Procedure: EXPLORATORY LAPAROTOMY, EXPLORATORY CELIOTOMY WITH OR WITHOUT BIOPSY(S);  Surgeon: Aloysius Euell Quale, MD;  Location: MAIN OR [facility];  Service: Gastrointestinal    PR EXPLORATORY OF ABDOMEN Midline 11/01/2019    Procedure: EXPLORATORY LAPAROTOMY, EXPLORATORY CELIOTOMY WITH OR WITHOUT BIOPSY(S);  Surgeon: Aloysius Euell Quale, MD;  Location: MAIN OR [facility];  Service: Gastrointestinal    PR FIX FINGER,VOLAR PLATE,I-P JT Left 08/07/2022    Procedure: REPAIR AND RECONSTRUCTION, FINGER, VOLAR PLATE, INTERPHALANGEAL JOINT;  Surgeon: Bonifacio Cordella HERO, MD;  Location: MAIN OR [facility];  Service: Orthopedics    PR FREEING BOWEL ADHESION,ENTEROLYSIS N/A 11/01/2019    Procedure: Enterolysis (Separt Proc);  Surgeon: Aloysius Euell Quale, MD;  Location: MAIN OR [facility];  Service: Gastrointestinal    PR FUSION MC-P JT Left 08/27/2023    Procedure: ARTHRODESIS, METACARPOPHALANGEAL JOINT, WITH OR WITHOUT INTERNAL FIXATION;  Surgeon: Bonifacio Cordella HERO, MD;  Location: OR [facility];  Service: Orthopedics    PR ILEOSCOPY THRU STOMA,BIOPSY N/A 10/14/2019    Procedure: LYNETT; LORN 1/MX;  Surgeon: Thedora Alm Plain, MD;  Location: GI PROCEDURES [facility];  Service: Gastroenterology    PR PART REMOVAL COLON W COLOSTOMY Midline 09/02/2019    Procedure: COLECTOMY, PARTIAL; WITH SKIN LEVEL CECOSTOMY OR COLOSTOMY;  Surgeon: Aloysius Euell Quale, MD;  Location: MAIN OR [facility];  Service: Gastrointestinal    PR REPAIR INCISIONAL HERNIA,REDUCIBLE Midline 09/02/2019    Procedure: REPAIR INIT INCISIONAL OR VENTRAL HERNIA; REDUCIBLE;  Surgeon: Arnette Darice Burnet, MD;  Location: MAIN OR [place];  Service: Trauma    PR UPPER GI ENDOSCOPY,BIOPSY N/A 01/07/2013    Procedure: UGI ENDOSCOPY; WITH BIOPSY, SINGLE OR MULTIPLE;  Surgeon: Liliane JENEANE Marker, MD;  Location: GI PROCEDURES [facility];  Service: Gastroenterology    PR UPPER GI ENDOSCOPY,BIOPSY N/A 10/14/2019    Procedure: UGI ENDOSCOPY; WITH BIOPSY, SINGLE OR MULTIPLE;  Surgeon: Thedora Alm Plain, MD;  Location: GI PROC[facility]URES [facility];  Service: Gastroenterology    PR UPPER GI ENDOSCOPY,DIAGNOSIS N/A 10/01/2022    Procedure: UGI ENDO, INCLUDE ESOPHAGUS, STOMACH, & DUODENUM &/OR JEJUNUM; DX W/WO COLLECTION SPECIMN, BY BRUSH OR WASH;  Surgeon: Darrick Peck, MD;  Location: GI PROCEDURES [facility];  Service: Gastroenterology   [3]   Family History  Problem Relation Age of Onset    Breast cancer Mother     Diabetes Maternal Aunt     Colon cancer Maternal Grandmother     Prostate cancer Maternal Grandfather     Cancer Paternal Grandmother     Crohn's disease Neg Hx    [4]   Social History  Socioeconomic History    Marital status: Divorced    Number of children: 1    Highest education level: GED or equivalent   Tobacco Use    Smoking status: Every Day     Current packs/day: 0.50     Average packs/day: 0.5 packs/day for 22.6 years (11.3 ttl pk-yrs)     Types: Cigarettes     Start date: 10/02/2001     Passive exposure: Current    Smokeless tobacco: Never    Tobacco comments:     10cpd   Vaping Use    Vaping status: Some Days    Substances: Nicotine     Passive vaping exposure: Yes   Substance and Sexual  Activity    Alcohol use: No     Alcohol/week: 0.0 standard drinks of alcohol    Drug use: Yes     Types: Marijuana     Comment: Smokes 2-3 joints/day    Sexual activity: Not Currently     Partners: Female     Birth control/protection: Condom   Social History Narrative    Lives by himself in [place]. Not working.       Social Drivers of Psychologist, Prison And Probation Services Strain: Low Risk  (05/16/2024)    Overall Financial Resource Strain (CARDIA)     Difficulty of Paying Living Expenses: Not very hard   Food Insecurity: No Food Insecurity (05/16/2024)    Hunger Vital Sign     Worried About Running Out of Food in the Last Year: Never true     Ran Out of Food in the Last Year: Never true   Transportation Needs: No Transportation Needs (05/16/2024)    PRAPARE - Therapist, Art (Medical): No     Lack of Transportation (Non-Medical): No   Physical Activity: Not on File (06/21/2021)    Received from [facility]    Physical Activity     Physical Activity: 0   Stress: Not on File (06/21/2021)    Received from [facility]    Stress     Stress: 0   Social Connections: Not on File (04/18/2023) Received from [facility]    Social Connections     Connectedness: 0   Housing: Low Risk  (05/16/2024)    Housing     Within the past 12 months, have you ever stayed: outside, in a car, in a tent, in an overnight shelter, or temporarily in someone else's home (i.e. couch-surfing)?: No     Are you worried about losing your housing?: No   [5]   Allergies  Allergen Reactions    Morphine  Itching and Rash     He gets light headed    Toradol [Ketorolac] Itching   [6]   Current Outpatient Medications   Medication Sig Dispense Refill    acetaminophen  (TYLENOL ) 500 MG tablet Take 2 tablets (1,000 mg total) by mouth every eight (8) hours. 30 tablet 0    empty container Misc Use as directed 1 each 1    ergocalciferol-1,250 mcg, 50,000 unit, (DRISDOL) 1,250 mcg (50,000 unit) capsule Take 1 capsule (1,250 mcg total) by mouth once a week.      ferrous sulfate  325 (65 FE) MG tablet Take 1 tablet (325 mg total) by mouth in the morning.      FLONASE  ALLERGY RELIEF 50 mcg/actuation nasal spray 2 sprays into each nostril daily as needed.      guselkumab  (TREMFYA  PEN) 200 mg/2 mL PnIj Inject the contents of 1 pen (200 mg) under the skin every twenty-eight (28) days. 2 mL 5    hydrocortisone 2.5 % cream Apply topically two (2) times a day. 30 g 0    nicotine  (NICODERM CQ ) 14 mg/24 hr patch Place 1 patch on the skin daily. 28 patch 1    nicotine  polacrilex (NICORETTE MINI) lozenge 4 MG Apply 1 lozenge (4 mg total) to cheek every hour as needed for smoking cessation. 72 each 0    nicotine  polacrilex (NICORETTE) 2 mg gum Apply 1 each (2 mg total) to cheek every hour as needed for smoking cessation. 110 each 0    ondansetron  (ZOFRAN -ODT) 4 MG disintegrating tablet Take 1 tablet (4 mg total) by  mouth every eight (8) hours as needed for nausea for up to 7 days. 21 tablet 0    OPTICHAMBER DIAMOND [facility] Spcr       QUEtiapine  (SEROQUEL ) 100 MG tablet Take 1 tablet (100 mg total) by mouth nightly.      SYMBICORT  80-4.5 mcg/actuation inhaler Inhale 2 puffs  in the morning. 10.2 g 0    VENTOLIN  HFA 90 mcg/actuation inhaler Inhale 2 puffs every six (6) hours as needed. 18 g 0     No current facility-administered medications for this visit.

## 2024-05-27 ENCOUNTER — Ambulatory Visit: Admit: 2024-05-27 | Payer: Medicaid (Managed Care)

## 2024-05-27 MED FILL — TREMFYA PEN 200 MG/2 ML SUBCUTANEOUS PEN INJECTOR: SUBCUTANEOUS | 28 days supply | Qty: 2 | Fill #2

## 2024-06-05 DIAGNOSIS — D500 Iron deficiency anemia secondary to blood loss (chronic): Principal | ICD-10-CM

## 2024-06-23 NOTE — Progress Notes (Signed)
 [facility] Delivery Pharm[facility] Refill Coordination Note    Specialty Medication(s) to be Shipped:   Inflammatory Disorders: Tremfya     Other medication(s) to be shipped: No additional medications requested for fill at this time    Specialty Medications not needed at this time: N/A     Jerome Kennedy., DOB: [birth year]  Phone: [phone] (home)       All above HIPAA information was verified with patient.     Was a nurse, learning disability used for this call? No    Completed refill call assessment today to schedule patient's medication shipment from the [facility]  [phone]).  All relevant notes have been reviewed.     Specialty medication(s) and dose(s) confirmed: Regimen is correct and unchanged.   Changes to medications: Naziah reports no changes at this time.  Changes to insurance: No  New side effects reported not previously addressed with a pharmacist or physician: None reported  Questions for the pharmacist: No    Confirmed patient received a Conservation Officer, Historic Buildings and a Surveyor, Mining with first shipment. The patient will receive a drug information handout for each medication shipped and additional FDA Medication Guides as required.       DISEASE/MEDICATION-SPECIFIC INFORMATION        For patients on injectable medications: Next injection is scheduled for 11/25.    SPECIALTY MEDICATION ADHERENCE     Medication Adherence    Patient reported X missed doses in the last month: 0  Specialty Medication: TREMFYA  PEN 200 mg/2 mL  Patient is on additional specialty medications: No              Were doses missed due to medication being on hold? No    Tremfya  200/2 mg/ml: 0 doses of medicine on hand        REFERRAL TO PHARMACIST     Referral to the pharmacist: Not needed      [facility]     Shipping address confirmed in Epic.     Cost and Payment: Patient has a copay of $4. They are aware and have authorized the pharmacy to charge the credit card on file.    Delivery Scheduled: Yes, Expected medication delivery date: 06/24/24.     Medication will be delivered via Same Day Courier to the prescription address in Epic [place].    [name]   [facility]  Specialty Technician

## 2024-06-24 MED FILL — TREMFYA PEN 200 MG/2 ML SUBCUTANEOUS PEN INJECTOR: SUBCUTANEOUS | 28 days supply | Qty: 2 | Fill #3

## 2024-07-10 DIAGNOSIS — D500 Iron deficiency anemia secondary to blood loss (chronic): Principal | ICD-10-CM

## 2024-07-14 NOTE — Progress Notes (Signed)
 [facility] and Home Delivery Pharm[facility] Refill Coordination Note    Specialty Medication(s) to be Shipped:   Inflammatory Disorders: Tremfya     Other medication(s) to be shipped: No additional medications requested for fill at this time    Specialty Medications not needed at this time: N/A     Jerome Kennedy., DOB: 05/13/1976  Phone: [phone] (home)       All above HIPAA information was verified with patient.     Was a nurse, learning disability used for this call? No    Completed refill call assessment today to schedule patient's medication shipment from the [facility] Delivery Pharmacy  [phone]).  All relevant notes have been reviewed.     Specialty medication(s) and dose(s) confirmed: Regimen is correct and unchanged.   Changes to medications: Jerome Kennedy reports no changes at this time.  Changes to insurance: No  New side effects reported not previously addressed with a pharmacist or physician: None reported  Questions for the pharmacist: No    Confirmed patient received a Conservation Officer, Historic Buildings and a Surveyor, Mining with first shipment. The patient will receive a drug information handout for each medication shipped and additional FDA Medication Guides as required.       DISEASE/MEDICATION-SPECIFIC INFORMATION        For patients on injectable medications: Next injection is scheduled for due 12/15 will receive on 12/16.    SPECIALTY MEDICATION ADHERENCE     Medication Adherence    Specialty Medication: TREMFYA  PEN 200 mg/2 mL  Patient is on additional specialty medications: No              Were doses missed due to medication being on hold? No    Tremfya  200/2 mg/ml: 0 doses of medicine on hand        REFERRAL TO PHARMACIST     Referral to the pharmacist: Not needed      [facility]     Shipping address confirmed in Epic.     Cost and Payment: Patient has a copay of $4. They are aware and have authorized the pharmacy to charge the credit card on file.    Delivery Scheduled: Yes, Expected medication delivery date: 07/18/24.     Medication will be delivered via Next Day Courier to the prescription address in Epic [place].    Jerome Kennedy   [facility] Delivery Pharm[facility]  Specialty Technician

## 2024-07-18 MED FILL — TREMFYA PEN 200 MG/2 ML SUBCUTANEOUS PEN INJECTOR: SUBCUTANEOUS | 28 days supply | Qty: 2 | Fill #4

## 2024-07-21 ENCOUNTER — Emergency Department
Admit: 2024-07-21 | Discharge: 2024-07-22 | Disposition: A | Discharge status: Home / Self Care | Payer: Medicaid (Managed Care) | Attending: Emergency Medicine

## 2024-07-21 DIAGNOSIS — K5090 Crohn's disease, unspecified, without complications: Principal | ICD-10-CM

## 2024-07-21 DIAGNOSIS — R109 Unspecified abdominal pain: Principal | ICD-10-CM

## 2024-07-21 LAB — CBC W/ AUTO DIFF
BASOPHILS ABSOLUTE COUNT: 0 10*9/L (ref 0.0–0.1)
BASOPHILS RELATIVE PERCENT: 0.7 %
EOSINOPHILS ABSOLUTE COUNT: 0.2 10*9/L (ref 0.0–0.5)
EOSINOPHILS RELATIVE PERCENT: 3.5 %
HEMATOCRIT: 39.3 % (ref 39.0–48.0)
HEMOGLOBIN: 12.8 g/dL — ABNORMAL LOW (ref 12.9–16.5)
LYMPHOCYTES ABSOLUTE COUNT: 1.7 10*9/L (ref 1.1–3.6)
LYMPHOCYTES RELATIVE PERCENT: 25.9 %
MEAN CORPUSCULAR HEMOGLOBIN CONC: 32.6 g/dL (ref 32.0–36.0)
MEAN CORPUSCULAR HEMOGLOBIN: 28 pg (ref 25.9–32.4)
MEAN CORPUSCULAR VOLUME: 85.9 fL (ref 77.6–95.7)
MEAN PLATELET VOLUME: 8.3 fL (ref 6.8–10.7)
MONOCYTES ABSOLUTE COUNT: 0.5 10*9/L (ref 0.3–0.8)
MONOCYTES RELATIVE PERCENT: 7.9 %
NEUTROPHILS ABSOLUTE COUNT: 4.1 10*9/L (ref 1.8–7.8)
NEUTROPHILS RELATIVE PERCENT: 62 %
PLATELET COUNT: 252 10*9/L (ref 150–450)
RED BLOOD CELL COUNT: 4.57 10*12/L (ref 4.26–5.60)
RED CELL DISTRIBUTION WIDTH: 14.3 % (ref 12.2–15.2)
WBC ADJUSTED: 6.6 10*9/L (ref 3.6–11.2)

## 2024-07-21 LAB — URINALYSIS WITH MICROSCOPY WITH CULTURE REFLEX PERFORMABLE
BACTERIA: NONE SEEN /HPF
BILIRUBIN UA: NEGATIVE
BLOOD UA: NEGATIVE
GLUCOSE UA: NEGATIVE
KETONES UA: NEGATIVE
LEUKOCYTE ESTERASE UA: NEGATIVE
NITRITE UA: NEGATIVE
PH UA: 6.5 (ref 5.0–9.0)
PROTEIN UA: NEGATIVE
RBC UA: 1 /HPF (ref <=3)
SPECIFIC GRAVITY UA: 1.05 — ABNORMAL HIGH (ref 1.003–1.030)
SQUAMOUS EPITHELIAL: 1 /HPF (ref 0–5)
UROBILINOGEN UA: <2
WBC UA: 2 /HPF (ref <=2)

## 2024-07-21 LAB — COMPREHENSIVE METABOLIC PANEL
ALBUMIN: 2.8 g/dL — ABNORMAL LOW (ref 3.4–5.0)
ALKALINE PHOSPHATASE: 146 U/L — ABNORMAL HIGH (ref 46–116)
ALT (SGPT): 9 U/L — ABNORMAL LOW (ref 10–49)
ANION GAP: 9 mmol/L (ref 5–14)
AST (SGOT): 15 U/L (ref <=34)
BILIRUBIN TOTAL: 0.4 mg/dL (ref 0.3–1.2)
BLOOD UREA NITROGEN: 6 mg/dL — ABNORMAL LOW (ref 9–23)
BUN / CREAT RATIO: 7
CALCIUM: 7.3 mg/dL — ABNORMAL LOW (ref 8.7–10.4)
CHLORIDE: 108 mmol/L — ABNORMAL HIGH (ref 98–107)
CO2: 26 mmol/L (ref 20.0–31.0)
CREATININE: 0.87 mg/dL (ref 0.73–1.18)
EGFR CKD-EPI (2021) MALE: >90 mL/min/1.73m2 (ref >=60)
GLUCOSE RANDOM: 93 mg/dL (ref 70–179)
POTASSIUM: 3.7 mmol/L (ref 3.4–4.8)
PROTEIN TOTAL: 6.1 g/dL (ref 5.7–8.2)
SODIUM: 143 mmol/L (ref 135–145)

## 2024-07-21 LAB — SEDIMENTATION RATE: ERYTHROCYTE SEDIMENTATION RATE: 13 mm/h (ref 0–15)

## 2024-07-21 LAB — LIPASE: LIPASE: 73 U/L — ABNORMAL HIGH (ref 12–53)

## 2024-07-21 LAB — C-REACTIVE PROTEIN: C-REACTIVE PROTEIN: 13.6 mg/L — ABNORMAL HIGH (ref <=10.0)

## 2024-07-21 MED ORDER — PREDNISONE 20 MG TABLET
ORAL_TABLET | Freq: Every day | ORAL | 0 refills | 7.00000 days | Status: CP
Start: 2024-07-21 — End: 2024-07-28

## 2024-07-21 MED ADMIN — prochlorperazine (COMPAZINE) injection 5 mg: 5 mg | INTRAVENOUS | @ 23:00:00 | Stop: 2024-07-21

## 2024-07-21 MED ADMIN — diphenhydrAMINE (BENADRYL) injection 25 mg: 25 mg | INTRAVENOUS | @ 22:00:00 | Stop: 2024-07-21

## 2024-07-21 MED ADMIN — HYDROmorphone (PF) (DILAUDID) injection 0.5 mg: 0.5 mg | INTRAVENOUS | @ 22:00:00 | Stop: 2024-07-21

## 2024-07-21 MED ADMIN — sodium chloride 0.9% (NS) bolus 1,000 mL: 1000 mL | INTRAVENOUS | Stop: 2024-07-21

## 2024-07-21 MED ADMIN — HYDROmorphone (PF) (DILAUDID) injection 0.5 mg: 0.5 mg | INTRAVENOUS | Stop: 2024-07-21

## 2024-07-21 NOTE — ED Triage Note (Signed)
 Pt BIB [facility] from home for c/o abdominal pain beginning this morning. Hx chron's. Fentanyl  50mcg and Zofran  4mg  IV given en route. VSS.

## 2024-07-21 NOTE — ED Provider Notes (Signed)
 [facility]  Emergency Department Provider Note     ED Clinical Impression     Final diagnoses:   Abdominal pain, unspecified abdominal location (Primary)   Crohn's disease without complication, unspecified gastrointestinal tract location    (CMS-HCC)      Impression, Medical Decision Making, ED Course     DDX: The differential for this complaint includes but is not limited to Crohn's flare, appendicitis, dehydration, SBO, enterocolitis, typhlitis, UTI, drug withdrawal..     Medical Decision Making  48 year old male with history of Crohn's disease currently on Tremfya , tobacco use disorder, previous cocaine and marijuana use disorder, presenting for 2 weeks of pruritic upper extremity rash and 1 day of right lower quadrant abdominal pain with associated nausea and diarrhea.  Not displaying any specific Crohn's symptoms such as pain with defecation or rectal bleeding but has a history of recent flareup as recent as October.  Not currently on any steroids, antibiotics or blood thinners.    History and presentation concerning for possible Crohn's disease flare given history of flares most recently in October, alternatively may have appendicitis given acute onset of nausea, vomiting, diarrhea, abdominal pain, anorexia.  Not able to tolerate fluids well.  Will eval for Crohn's flare with CBC CMP, C-reactive protein, ESR.  Additionally will obtain lipase, urinalysis.  Symptomatically will provide 0.5 Dilaudid  given allergy to Toradol and morphine , Benadryl  for the itching rash, Compazine  for nausea.  Will provide 1 L IV fluids.  Patient states no recent use of illicit substances but symptoms may represent acute withdrawal.  Continue to monitor.      Initial vitals reviewed as within normal limits nonactionable.    ED Course as of 07/21/24 2249   Thu Jul 21, 2024   1742 CBC unremarkable for leukocytosis, CMP demonstrating calcium  7.3, corrected to 8.3 for albumin correction.  Recommend increase calcium  intake, lipase 73, C-reactive mildly elevated at 13.6 but sed rate not elevated.   1841 Due to difficulties with IV given difficult veins had delays in CT abdominal imaging.  Redosing pain medication at this time, new ultrasound IV has been obtained   2105 CT imaging consistent with probable Crohn's flare as seen in prior imaging from 10/11, no evidence of obstruction or strangulation.   2123 Page out to gastroenterology for discharge recommendations for anti-Crohn's medications and follow-up.   2242 Have not yet heard from gastroenterology but patient is requesting to leave stating he has close follow-up with gastroenterology and has enough medication to help keep him comfortable until then.  Discussed previous discharges with prednisone  have helped with symptomatic management of his Crohn's disease, amenable to discharge with 20 mg prednisone  with goal that patient will reach out to GI in the morning to confirm any other medication recommendations.  Patient is agreeable to plan and will have strict return precautions for worsening pain, fevers, change in GI symptoms    Patient is able to tolerate p.o. food and water , stable to use the restroom, pain is tolerable, does not appear septic, no fevers, no evidence of SBO, toxic megacolon, acute abdomen.  Is stable for discharge and believe he will be safe to obtain follow-up with GI in the outpatient setting, urinalysis was also unremarkable       Disposition: Home    MDM Elements  Discussion of Management with other Physicians, QHP or Appropriate Source: None  Independent Interpretation of Studies: CT scan(s) - large air within the colon, no obvious obstruction  I have reviewed recent and relevant previous  record, including: Inpatient notes - previous abdominal pain admission  Escalation of Care including OBS/Admission/Transfer was considered: However, patient was determined to be appropriate for outpatient management.  Social Determinants that significantly affected care: Lacks transportation      ____________________________________________    The case was discussed with the attending physician, who is in agreement with the above assessment and plan.      History     Chief Complaint  Chief Complaint   Patient presents with    Abdominal Pain       History of Present Illness  Jerome Diodato. is a 48 year old male with Crohn's disease who presents with vomiting and abdominal pain.    He experienced sudden onset vomiting and abdominal pain beginning yesterday. The vomiting is greenish without blood, and he is unable to retain any oral intake. He also has watery, loose bowel movements without blood. He reports chills but no fever, cough, or upper respiratory symptoms.    The abdominal pain is localized to the right lower quadrant, which is where he typically experiences pain during Crohn's flares. He has a history of three abdominal surgeries, including gallbladder removal and intestinal resection, and mentions a hernia from a previous surgery. No new genital issues or changes in urination are reported.    He has a history of deep vein thrombosis but is not currently on anticoagulation therapy. He also has a history of bipolar type 1 disorder, cannabis use, cocaine use, and tobacco use disorder. He is currently using nicotine  supplements and Ventolin .    He has a rash that began on his arms and spread to his hands, present for about two weeks and itchy. He associates the rash with previous reactions to morphine  and Toradol. No new rashes on his legs or other parts of his body are noted.    He has a history of tobacco use and denies recent alcohol or other drug use.      Past Medical History[1]    Past Surgical History[2]      Current Facility-Administered Medications:     predniSONE  (DELTASONE ) tablet 40 mg, 40 mg, Oral, Once, Katheren Jimmerson, [place] LABOR, MD    Current Outpatient Medications:     acetaminophen  (TYLENOL ) 500 MG tablet, Take 2 tablets (1,000 mg total) by mouth every eight (8) hours., Disp: 30 tablet, Rfl: 0    empty container Misc, Use as directed, Disp: 1 each, Rfl: 1    ergocalciferol-1,250 mcg, 50,000 unit, (DRISDOL) 1,250 mcg (50,000 unit) capsule, Take 1 capsule (1,250 mcg total) by mouth once a week., Disp: , Rfl:     ferrous sulfate  325 (65 FE) MG tablet, Take 1 tablet (325 mg total) by mouth in the morning., Disp: , Rfl:     FLONASE  ALLERGY RELIEF 50 mcg/actuation nasal spray, 2 sprays into each nostril daily as needed., Disp: , Rfl:     guselkumab  (TREMFYA  PEN) 200 mg/2 mL PnIj, Inject the contents of 1 pen (200 mg) under the skin every twenty-eight (28) days., Disp: 2 mL, Rfl: 5    hydrocortisone  2.5 % cream, Apply topically two (2) times a day., Disp: 30 g, Rfl: 0    nicotine  (NICODERM CQ ) 14 mg/24 hr patch, Place 1 patch on the skin daily., Disp: 28 patch, Rfl: 1    nicotine  polacrilex (NICORETTE MINI) lozenge 4 MG, Apply 1 lozenge (4 mg total) to cheek every hour as needed for smoking cessation., Disp: 72 each, Rfl: 0    nicotine  polacrilex (NICORETTE) 2  mg gum, Apply 1 each (2 mg total) to cheek every hour as needed for smoking cessation., Disp: 110 each, Rfl: 0    ondansetron  (ZOFRAN -ODT) 4 MG disintegrating tablet, Take 1 tablet (4 mg total) by mouth every eight (8) hours as needed for nausea for up to 7 days. (Patient not taking: Reported on 06/16/2024), Disp: 21 tablet, Rfl: 0    OPTICHAMBER DIAMOND [facility] Spcr, , Disp: , Rfl:     predniSONE  (DELTASONE ) 20 MG tablet, Take 2 tablets (40 mg total) by mouth daily for 7 days., Disp: 14 tablet, Rfl: 0    QUEtiapine  (SEROQUEL ) 100 MG tablet, Take 1 tablet (100 mg total) by mouth nightly., Disp: , Rfl:     SYMBICORT  80-4.5 mcg/actuation inhaler, Inhale 2 puffs  in the morning., Disp: 10.2 g, Rfl: 0    VENTOLIN  HFA 90 mcg/actuation inhaler, Inhale 2 puffs every six (6) hours as needed., Disp: 18 g, Rfl: 0    Allergies  Morphine  and Toradol [ketorolac]    Family History  Family History[3]    Social History  Short Social History[4] Physical Exam     VITAL SIGNS:      Vitals:    07/21/24 1401 07/21/24 1407 07/21/24 1411 07/21/24 1802   BP:   111/73 116/86   Pulse: 81  80 78   Resp:   16 18   Temp:   36.8 ??C (98.2 ??F) 37 ??C (98.6 ??F)   TempSrc:   Oral Oral   SpO2: 99%  99% 100%   Weight:  69.3 kg (152 lb 12.5 oz)         Pertinent physical exam:   Physical Exam  GENERAL: Alert, cooperative, well developed, no acute distress.  HEENT: Normocephalic, normal oropharynx, moist mucous membranes.  CHEST: Clear to auscultation bilaterally, no wheezes, rhonchi, or crackles.  CARDIOVASCULAR: Normal heart rate and rhythm, S1 and S2 normal without murmurs.  ABDOMEN: Soft, tenderness in the right upper and lower quadrants, large incisional hernia present in central abdominal area without tenderness or overlying erythema without organomegaly, normal bowel sounds.  EXTREMITIES: No cyanosis or edema.  NEUROLOGICAL: Cranial nerves grossly intact, moves all extremities without gross motor or sensory deficit.  SKIN: Scattered erythematous papular rashes on upper arms, back and torso and lower extremity skin normal, no other rashes on body.     Radiology     CT Abdomen Pelvis W IV Contrast   Final Result   1.  Redemonstrated inflammatory changes along the neoterminal ileum proximal to the ileocolonic anastomosis that appears similar to prior imaging from 05/14/2024, which is suspicious for active Crohn's disease flare.      2.  Redemonstrated wall thickening of the distal rectum with mild upstream dilatation that appears similar to prior imaging.      3.  Redemonstrated protrusion of loops of large bowel through the ventral abdominal hernia with no evidence of strangulation or obstruction.          Pertinent labs & imaging results that were available during my care of the patient were independently interpreted by me and considered in my medical decision making (see chart for details).    Portions of this record have been created using Scientist, clinical (histocompatibility and immunogenetics). Dictation errors have been sought, but may not have been identified and corrected.               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    Myalgia     Rhinovirus infection 10/29/2022    SBO (small bowel obstruction) (CMS-HCC) 2011   [2]   Past Surgical History:  Procedure Laterality Date    CHOLECYSTECTOMY      COLON SURGERY      partial resection    HERNIA REPAIR      PR ARTHRP INTERCARPAL/CARP/MTCRPL JT INTERPOSITION Left 08/07/2022    Procedure: INTERPOSIT ARTHROPLASTY-INTERCARPAL/CARPOMETACAR;  Surgeon: Bonifacio Cordella HERO, MD;  Location: MAIN OR [facility];  Service: Orthopedics    PR CLOSE ENTEROSTOMY,RESEC+COLOREC ANAS Midline 11/01/2019    Procedure: CLO ENTEROSTOMY; W/RESECT AUGIE FLAKES;  Surgeon: Aloysius Euell Quale, MD;  Location: MAIN OR [place];  Service: Gastrointestinal    PR COLONOSCOPY FLX DX W/COLLJ SPEC WHEN PFRMD Left 01/07/2013    Procedure: COLONOSCOPY, FLEXIBLE, PROXIMAL TO SPLENIC FLEXURE; DIAGNOSTIC, W/WO COLLECTION SPECIMEN BY BRUSH OR WASH;  Surgeon: Liliane JENEANE Marker, MD;  Location: GI PROCEDURES [facility];  Service: Gastroenterology    PR COLONOSCOPY FLX DX W/COLLJ SPEC WHEN PFRMD  03/09/2015    Procedure: COLONOSCOPY, FLEXIBLE, PROXIMAL TO SPLENIC FLEXURE; DIAGNOSTIC, W/WO COLLECTION SPECIMEN BY BRUSH OR WASH;  Surgeon: Artist Jayson Leather, MD;  Location: GI PROCEDURES [facility];  Service: Gastroenterology    PR COLONOSCOPY FLX DX W/COLLJ SPEC WHEN PFRMD N/A 10/28/2019    Procedure: COLONOSCOPY, FLEXIBLE, PROXIMAL TO SPLENIC FLEXURE; DIAGNOSTIC, W/WO COLLECTION SPECIMEN BY BRUSH OR WASH;  Surgeon: Derick Bring, MD;  Location: GI PROCEDURES [facility];  Service: Gastroenterology    PR COLONOSCOPY FLX DX W/COLLJ SPEC WHEN PFRMD N/A 10/01/2022    Procedure: COLONOSCOPY, FLEXIBLE, PROXIMAL TO SPLENIC FLEXURE; DIAGNOSTIC, W/WO COLLECTION SPECIMEN BY BRUSH OR WASH;  Surgeon: Darrick Peck, MD;  Location: GI PROCEDURES [facility]ORIAL [facility];  Service: Gastroenterology    PR COLONOSCOPY FLX DX W/COLLJ SPEC WHEN PFRMD N/A 11/30/2023    Procedure: COLONOSCOPY, FLEXIBLE, PROXIMAL TO SPLENIC FLEXURE; DIAGNOSTIC, W/WO COLLECTION SPECIMEN BY BRUSH OR WASH;  Surgeon: Audrey Lacinda Hun, MD;  Location: GI PROCEDURES [facility];  Service: Gastroenterology    PR COLONOSCOPY W/BIOPSY SINGLE/MULTIPLE N/A 04/08/2024    Procedure: COLONOSCOPY, FLEXIBLE, PROXIMAL TO SPLENIC FLEXURE; WITH BIOPSY, SINGLE OR MULTIPLE;  Surgeon: Celestina Comer Norris, MD;  Location: GI PROCEDURES [facility];  Service: Gastroenterology    PR EXPLORATORY OF ABDOMEN Midline 09/02/2019    Procedure: EXPLORATORY LAPAROTOMY, EXPLORATORY CELIOTOMY WITH OR WITHOUT BIOPSY(S);  Surgeon: Aloysius Euell Quale, MD;  Location: MAIN OR [facility];  Service: Gastrointestinal    PR EXPLORATORY OF ABDOMEN Midline 11/01/2019    Procedure: EXPLORATORY LAPAROTOMY, EXPLORATORY CELIOTOMY WITH OR WITHOUT BIOPSY(S);  Surgeon: Aloysius Euell Quale, MD;  Location: MAIN OR [facility];  Service: Gastrointestinal    PR FIX FINGER,VOLAR PLATE,I-P JT Left 08/07/2022    Procedure: REPAIR AND RECONSTRUCTION, FINGER, VOLAR PLATE, INTERPHALANGEAL JOINT;  Surgeon: Bonifacio Cordella HERO, MD;  Location: MAIN OR [facility];  Service: Orthopedics    PR FREEING BOWEL ADHESION,ENTEROLYSIS N/A 11/01/2019    Procedure: Enterolysis (Separt Proc);  Surgeon: Aloysius Euell Quale, MD;  Location:  MAIN OR [facility];  Service: Gastrointestinal    PR FUSION MC-P JT Left 08/27/2023    Procedure: ARTHRODESIS, METACARPOPHALANGEAL JOINT, WITH OR WITHOUT INTERNAL FIXATION;  Surgeon: Bonifacio Cordella HERO, MD;  Location: OR [facility];  Service: Orthopedics    PR ILEOSCOPY THRU STOMA,BIOPSY N/A 10/14/2019    Procedure: LYNETT; LORN 1/MX;  Surgeon: Thedora Alm Plain, MD;  Location: GI PROCEDURES [facility];  Service: Gastroenterology    PR PART REMOVAL COLON W COLOSTOMY Midline 09/02/2019    Procedure: COLECTOMY, PARTIAL; WITH SKIN LEVEL CECOSTOMY OR COLOSTOMY;  Surgeon: Aloysius Euell Quale, MD;  Location: MAIN OR [facility];  Service: Gastrointestinal    PR REPAIR INCISIONAL HERNIA,REDUCIBLE Midline 09/02/2019    Procedure: REPAIR INIT INCISIONAL OR VENTRAL HERNIA; REDUCIBLE;  Surgeon: Arnette Darice Burnet, MD;  Location: MAIN OR [facility];  Service: Trauma    PR UPPER GI ENDOSCOPY,BIOPSY N/A 01/07/2013    Procedure: UGI ENDOSCOPY; WITH BIOPSY, SINGLE OR MULTIPLE;  Surgeon: Liliane JENEANE Marker, MD;  Location: GI PROCEDURES [facility];  Service: Gastroenterology    PR UPPER GI ENDOSCOPY,BIOPSY N/A 10/14/2019    Procedure: UGI ENDOSCOPY; WITH BIOPSY, SINGLE OR MULTIPLE;  Surgeon: Thedora Alm Plain, MD;  Location: GI PROCEDURES [facility];  Service: Gastroenterology    PR UPPER GI ENDOSCOPY,DIAGNOSIS N/A 10/01/2022    Procedure: UGI ENDO, INCLUDE ESOPHAGUS, STOMACH, & DUODENUM &/OR JEJUNUM; DX W/WO COLLECTION SPECIMN, BY BRUSH OR WASH;  Surgeon: Darrick Peck, MD;  Location: GI PROCEDURES [facility];  Service: Gastroenterology   [3]   Family History  Problem Relation Age of Onset    Breast cancer Mother     Diabetes Maternal Aunt     Colon cancer Maternal Grandmother     Prostate cancer Maternal Grandfather     Cancer Paternal Grandmother     Crohn's disease Neg Hx    [4]   Social History  Tobacco Use    Smoking status: Every Day     Current packs/day: 0.50     Average packs/day: 0.5 packs/day for 22.8 years (11.4 ttl pk-yrs)     Types: Cigarettes     Start date: 10/02/2001     Passive exposure: Current    Smokeless tobacco: Never    Tobacco comments:     10cpd   Vaping Use Vaping status: Some Days    Substances: Nicotine     Passive vaping exposure: Yes   Substance Use Topics    Alcohol use: No     Alcohol/week: 0.0 standard drinks of alcohol    Drug use: Yes     Types: Marijuana     Comment: Smokes 2-3 joints/day        Correne Bernardino LABOR, MD  Resident  07/21/24 2249

## 2024-07-22 MED ADMIN — HYDROmorphone (PF) (DILAUDID) injection 0.5 mg: 0.5 mg | INTRAVENOUS | @ 03:00:00 | Stop: 2024-07-21

## 2024-07-22 MED ADMIN — iohexol (OMNIPAQUE) 350 mg iodine/mL solution 100 mL: 100 mL | INTRAVENOUS | @ 01:00:00 | Stop: 2024-07-21

## 2024-07-22 MED ADMIN — famotidine (PF) (PEPCID) injection 20 mg: 20 mg | INTRAVENOUS | @ 01:00:00 | Stop: 2024-07-21

## 2024-07-22 MED ADMIN — predniSONE (DELTASONE) tablet 40 mg: 40 mg | ORAL | @ 04:00:00 | Stop: 2024-07-21

## 2024-07-26 ENCOUNTER — Ambulatory Visit
Admission: EM | Admit: 2024-07-26 | Discharge: 2024-07-29 | Disposition: A | Discharge status: Home / Self Care | Payer: Medicaid (Managed Care)

## 2024-07-26 ENCOUNTER — Inpatient Hospital Stay
Admission: EM | Admit: 2024-07-26 | Discharge: 2024-07-29 | Disposition: A | Discharge status: Home / Self Care | Payer: Medicaid (Managed Care)

## 2024-07-26 LAB — APTT
APTT: 28.1 s (ref 24.8–38.4)
HEPARIN CORRELATION: 0.2

## 2024-07-26 LAB — CBC W/ AUTO DIFF
BASOPHILS ABSOLUTE COUNT: 0 10*9/L (ref 0.0–0.1)
BASOPHILS RELATIVE PERCENT: 0.7 %
EOSINOPHILS ABSOLUTE COUNT: 0.2 10*9/L (ref 0.0–0.5)
EOSINOPHILS RELATIVE PERCENT: 2.4 %
HEMATOCRIT: 38.9 % — ABNORMAL LOW (ref 39.0–48.0)
HEMOGLOBIN: 12.7 g/dL — ABNORMAL LOW (ref 12.9–16.5)
LYMPHOCYTES ABSOLUTE COUNT: 1.6 10*9/L (ref 1.1–3.6)
LYMPHOCYTES RELATIVE PERCENT: 25.2 %
MEAN CORPUSCULAR HEMOGLOBIN CONC: 32.5 g/dL (ref 32.0–36.0)
MEAN CORPUSCULAR HEMOGLOBIN: 27.6 pg (ref 25.9–32.4)
MEAN CORPUSCULAR VOLUME: 85 fL (ref 77.6–95.7)
MEAN PLATELET VOLUME: 7.9 fL (ref 6.8–10.7)
MONOCYTES ABSOLUTE COUNT: 0.4 10*9/L (ref 0.3–0.8)
MONOCYTES RELATIVE PERCENT: 7 %
NEUTROPHILS ABSOLUTE COUNT: 4.1 10*9/L (ref 1.8–7.8)
NEUTROPHILS RELATIVE PERCENT: 64.7 %
NUCLEATED RED BLOOD CELLS: 0 /100{WBCs} (ref <=4)
PLATELET COUNT: 228 10*9/L (ref 150–450)
RED BLOOD CELL COUNT: 4.58 10*12/L (ref 4.26–5.60)
RED CELL DISTRIBUTION WIDTH: 14.4 % (ref 12.2–15.2)
WBC ADJUSTED: 6.3 10*9/L (ref 3.6–11.2)

## 2024-07-26 LAB — COMPREHENSIVE METABOLIC PANEL
ALBUMIN: 2.7 g/dL — ABNORMAL LOW (ref 3.4–5.0)
ALKALINE PHOSPHATASE: 127 U/L — ABNORMAL HIGH (ref 46–116)
ALT (SGPT): 11 U/L (ref 10–49)
ANION GAP: 13 mmol/L (ref 5–14)
AST (SGOT): 14 U/L (ref <=34)
BILIRUBIN TOTAL: 0.4 mg/dL (ref 0.3–1.2)
BLOOD UREA NITROGEN: 9 mg/dL (ref 9–23)
BUN / CREAT RATIO: 8
CALCIUM: 8 mg/dL — ABNORMAL LOW (ref 8.7–10.4)
CHLORIDE: 112 mmol/L — ABNORMAL HIGH (ref 98–107)
CO2: 24.6 mmol/L (ref 20.0–31.0)
CREATININE: 1.12 mg/dL (ref 0.73–1.18)
EGFR CKD-EPI (2021) MALE: 81 mL/min/1.73m2 (ref >=60)
GLUCOSE RANDOM: 101 mg/dL (ref 70–179)
POTASSIUM: 3.4 mmol/L (ref 3.4–4.8)
PROTEIN TOTAL: 6.1 g/dL (ref 5.7–8.2)
SODIUM: 150 mmol/L — ABNORMAL HIGH (ref 135–145)

## 2024-07-26 LAB — BASIC METABOLIC PANEL
ANION GAP: 14 mmol/L (ref 5–14)
BLOOD UREA NITROGEN: 6 mg/dL — ABNORMAL LOW (ref 9–23)
BUN / CREAT RATIO: 7
CALCIUM: 7.5 mg/dL — ABNORMAL LOW (ref 8.7–10.4)
CHLORIDE: 111 mmol/L — ABNORMAL HIGH (ref 98–107)
CO2: 21 mmol/L (ref 20.0–31.0)
CREATININE: 0.9 mg/dL (ref 0.73–1.18)
EGFR CKD-EPI (2021) MALE: >90 mL/min/1.73m2 (ref >=60)
GLUCOSE RANDOM: 82 mg/dL (ref 70–179)
POTASSIUM: 3.3 mmol/L — ABNORMAL LOW (ref 3.4–4.8)
SODIUM: 146 mmol/L — ABNORMAL HIGH (ref 135–145)

## 2024-07-26 LAB — TOXICOLOGY SCREEN, URINE
AMPHETAMINE SCREEN URINE: NEGATIVE
BARBITURATE SCREEN URINE: NEGATIVE
BENZODIAZEPINE SCREEN, URINE: NEGATIVE
BUPRENORPHINE, URINE SCREEN: NEGATIVE
CANNABINOID SCREEN URINE: POSITIVE — AB
COCAINE(METAB.)SCREEN, URINE: NEGATIVE
FENTANYL SCREEN, URINE: NEGATIVE
METHADONE SCREEN, URINE: NEGATIVE
OPIATE SCREEN URINE: POSITIVE — AB
OXYCODONE SCREEN URINE: NEGATIVE

## 2024-07-26 LAB — PHOSPHORUS
PHOSPHORUS: 2.2 mg/dL — ABNORMAL LOW (ref 2.4–5.1)
PHOSPHORUS: 3 mg/dL (ref 2.4–5.1)

## 2024-07-26 LAB — PROTIME-INR
INR: 1.06
PROTIME: 12.1 s (ref 9.9–12.6)

## 2024-07-26 LAB — C-REACTIVE PROTEIN: C-REACTIVE PROTEIN: 7.5 mg/L (ref <=10.0)

## 2024-07-26 LAB — MAGNESIUM
MAGNESIUM: 1.2 mg/dL — ABNORMAL LOW (ref 1.6–2.6)
MAGNESIUM: 1.4 mg/dL — ABNORMAL LOW (ref 1.6–2.6)

## 2024-07-26 LAB — LIPASE: LIPASE: 69 U/L — ABNORMAL HIGH (ref 12–53)

## 2024-07-26 LAB — SEDIMENTATION RATE: ERYTHROCYTE SEDIMENTATION RATE: 15 mm/h (ref 0–15)

## 2024-07-26 MED ADMIN — magnesium sulfate in D5W 1 gram/100 mL infusion 1 g: 1 g | INTRAVENOUS | @ 20:00:00 | Stop: 2024-07-26

## 2024-07-26 MED ADMIN — hydrocortisone 2.5 % ointment 1 Application: 1 | TOPICAL | @ 18:00:00

## 2024-07-26 MED ADMIN — prochlorperazine (COMPAZINE) injection 5 mg: 5 mg | INTRAVENOUS | @ 18:00:00 | Stop: 2024-07-26

## 2024-07-26 MED ADMIN — HYDROmorphone (PF) (DILAUDID) injection 1 mg: 1 mg | INTRAVENOUS | @ 21:00:00 | Stop: 2024-07-26

## 2024-07-26 MED ADMIN — HYDROmorphone (PF) (DILAUDID) injection 1 mg: 1 mg | INTRAVENOUS | @ 18:00:00 | Stop: 2024-07-26

## 2024-07-26 MED ADMIN — potassium phosphate (monobasic) (K-PHOS) tablet 1,000 mg: 1000 mg | ORAL | @ 20:00:00 | Stop: 2024-07-26

## 2024-07-26 MED ADMIN — lactated ringers bolus 1,000 mL: 1000 mL | INTRAVENOUS | @ 20:00:00 | Stop: 2024-07-26

## 2024-07-26 MED ADMIN — iohexol (OMNIPAQUE) 350 mg iodine/mL solution 100 mL: 100 mL | INTRAVENOUS | @ 20:00:00 | Stop: 2024-07-26

## 2024-07-26 NOTE — Procedures (Signed)
 ULTRASOUND PIV PROCEDURE NOTE    Indications:   Poor venous access.    Ultrasound guidance was necessary to obtain access.     Procedure Details:  Identity of the patient was confirmed via name, medical record number and date of birth. The availability of the correct equipment was verified.    The vein was identified and measured for ultrasound catheter insertion.       Vein measurement (without tourniquet):   0.24 cm   A(n) 22 gauge 2.00 catheter was selected based on the recommendations below:    [facility] Catheter/Vein Ratio Guidelines   Chart to determine PIV catheter size/length to use based on vein diameter and depth   Catheter Gauge Size (g)  22g 20g 18g   Catheter length (inches)  1.75 1.75 1.75   Catheter diameter measurement (mm) 0.9 mm 1.1 mm 1.3 mm          Minimum required vein diameter       Sonosite (cm)  0.18 cm 0.22 cm 0.26 cm                  (mm)  1.8 mm  2.2 mm  2.6 mm   Maximum vein depth  1.25 cm 1.25 cm 1.25 cm          The field was prepared with necessary supplies and equipment.  Probe cover and sterile gel utilized. Insertion site was prepped with chlorhexidineand allowed to dry.  The catheter extension was primed with normal saline.  The US  PIV was placed in the RAC with 1attempt(s). See LDA for additional details.    Catheter aspirated, 5 mL blood return present. The catheter was then flushed with 10 mL of normal saline. Insertion site cleansed, and dressing applied per manufacturer guidelines. The catheter was inserted with difficulty due to poor vasculature by Kelley JINNY Glatter, RN.    Thank you,     Kelley JINNY Glatter, RN    Ultrasound Resource Nurse    Workup / Procedure Time:  30 minutes

## 2024-07-26 NOTE — ED Progress Note (Signed)
 ED Progress Note     Received sign out from previous provider at 3:00 PM.     Jerome Kennedy. is a 48 y.o. 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     Exam by the previous provider was notable for a large, tender umbilical hernia and RLQ tenderness to palpation. Also with extensive flat macular rash of the bilateral upper extremities with overlying excoriation. On workup thus far, CBC with mild anemia, improved from prior. CMP with hypernatremia to 150 (treated with 1L of fluids), hypocalcemia to 8 (improved from prior), and decreased albumin to 2.7 (similar to prior). Labs also notable for hypophosphatemia (2.2), hypomagnesemia (1.2), increased lipase to 69. CRP, ESR, PT-INR, and PTT within normal limits.     Pending CT A/P and admission.     Additional Progress Notes   ED Course as of 07/30/24 0414   Tue Jul 26, 2024   1620 CT Abdomen Pelvis W IV Contrast Only  Findings indicative of active inflammatory Crohn's disease involving the neoterminal ileum, similar, and rectum, new/increased.   1620 MAO paged for admission  89M Hx of Crohn's nonadherant to steroids here w/ 1wk abd pain, diarrhea, bloody stool, CT w/ c/f flare. Afebrile, VSS.   1840 Discussed patient with hospitalist team, who will see the patient at this time   1932 Inpatient team has assumed care at this time         Documentation assistance was provided by Burton Picking, Scribe, on July 26, 2024 at 3:25 PM for Sheran Florida, MD.      July 26, 2024 3:25 PM. Documentation assistance provided by the scribe. I was present during the time the encounter was recorded. The information recorded by the scribe was done at my direction and has been reviewed and validated by me.       Note has been documented by Su-Ji Cho on 07/26/2024

## 2024-07-26 NOTE — ED Provider Notes (Signed)
 [facility]  Emergency Department Provider Note     ED Clinical Impression     Final diagnoses:   Crohn's disease with complication, unspecified gastrointestinal tract location    (CMS-HCC) (Primary)      Impression, Medical Decision Making, ED Course     Medical Decision Making  A 48 year old male with a history of Crohn's disease, currently on prednisone  and previously on Tremfya , presented with right lower quadrant abdominal pain, nausea, vomiting, watery diarrhea with bright red blood, and fever up to 101??F. He has a history of tobacco use disorder and prior cocaine and marijuana use. Recent CT imaging was consistent with a probable Crohn's flare without evidence of small bowel obstruction or acute abdomen. He has a large, tender, reducible umbilical hernia and a flat, macular, nonblanchable, pruritic rash on his bilateral arms. It was noted that he has not been compliant with his prescribed prednisone , and is currently taking Protonix . On exam, his abdomen is soft with tenderness around the hernia and right lower quadrant, and his vital signs are stable.    Differential diagnosis includes, but is not limited to:  - Crohn's disease flare: The clinical presentation of right lower quadrant abdominal pain, watery diarrhea with bright red blood, fever, and recent imaging findings are consistent with an acute exacerbation of Crohn's disease.  - Small bowel obstruction: Small bowel obstruction was considered but is highly unlikely given the absence of supportive findings on recent CT imaging and lack of clinical evidence for obstruction. Further, paatient continues to have increased stool output which is more consistent with a Crohn's flare.  - Hemorrhoids: Zeven Kocak be related to his BRBPR, however it is far more likely that the bleeding is related to the severity of his current flare.  - Acute abdomen: Acute abdomen was considered but is highly unlikely due to the absence of peritoneal signs and no evidence on recent imaging.  - Appendicitis: Appendicitis was considered due to right lower quadrant pain, but is less likely given the context of known Crohn's disease and recent imaging findings.  - Toxic megacolon: Toxic megacolon was considered but is less likely based on the physical exam and absence of severe systemic toxicity.    Rash  - macular rash, nonblanchable, only on the lower upper extremities per the patient.   - hydrocortisone  cream for itching  - Casia Corti be related to his autoimmune disease. Unlikely to be hematologic presentation/coagulopathy based on limited to BUE, however Emre Stock be rheumatologic in nature. Patient denies sexual activity and immunosuppresion outside of his Tremfya  use. Also denies IVDU, further limiting possible risk of HIV or other processes that lead to similar rashes. Will recommend dermatologic evaluation as indicarted if patient admitted. Ordered coags for assessment for possible coagulopathy given non-blanching nature of the rash.    Crohn's disease flare with consideration of appendicitis, small bowel obstruction, acute abdomen, and toxic megacolon  - Advised to seek immediate care if symptoms worsen or new symptoms develop  - Order CT scan to assess for appendicitis or small bowel obstruction  - Administered Dilaudid  for pain management  - Administered Compazine  for nausea      Orders Placed This Encounter   Procedures    CT Abdomen Pelvis W IV Contrast Only    Sedimentation rate, manual    C-reactive protein    CBC w/ Differential    Comprehensive Metabolic Panel    Magnesium     Phosphorus    Lipase    PTT    PT-INR  ED Admit Decision       ED Course as of 07/26/24 1830   Tue Jul 26, 2024   1340 HGB(!): 12.7  Chronic anemia, improved from prior.   1404 Phosphorus(!): 2.2  Ordered replacement.   1404 Magnesium (!): 1.2  Ordered replacement.   1404 Sodium(!): 150  Ordered 1 L LR.   1405 Calcium (!): 8.0  Improved from prior.   1405 Albumin(!): 2.7  Similar to prior.  Likely in setting of chronic illness.   [id number] Patient was signed out to oncoming provider with plan to follow-up CT abdomen pelvis with IV contrast and likely admit to hospitalist with gastroenterology due to severe Crohn's disease flares with metabolic derangements and severe pain.       MDM Elements                ____________________________________________    The case was discussed with the attending physician, who is in agreement with the above assessment and plan.      History     Chief Complaint  Chief Complaint   Patient presents with    Abdominal Pain     History of Present Illness  Jerome Kennedy. is a 48 year old male with Crohn's disease who presents with acute right lower quadrant abdominal pain, nausea, and diarrhea.    He reports acute right lower quadrant abdominal pain with nausea and several episodes of watery diarrhea with bright red blood this morning. After lying down he vomited twice when getting up to use the bathroom. He notes chills and fever with a maximum temperature of 101??F at home, for which he took two Tylenol .    He has Crohn's disease and was evaluated for abdominal pain on July 21, 2024, with CT imaging consistent with a flare and was discharged on prednisone  20 mg. He had another flare on Saturday, July 23, 2024, again received steroids, and left the hospital earlier than recommended before admission.    He is on Tremfya  for Crohn's disease.  He also takes Protonix  for acid reflux and iron  supplementation. He denies taking other medications at this time.    He denies prior abdominal surgery and feels his abdominal size is unchanged.    He has tobacco use disorder and prior cocaine and marijuana use disorders. He reports a new, itchy, flat macular rash on both arms that started about two weeks ago.        Past Medical History[1]    Past Surgical History[2]    Active Medications[3]     Allergies[4]    Family History[5]    Short Social History[6]     Physical Exam     VITAL SIGNS:      Vitals: 07/26/24 1225 07/26/24 1227   BP: 128/84    Pulse: 57    Resp: 20    Temp: 36.9 ??C (98.4 ??F)    TempSrc: Oral    SpO2: 97%    Weight:  66.1 kg (145 lb 11.6 oz)       Constitutional: Alert and oriented. No acute distress.  Eyes: Conjunctivae are normal.  HEENT: Normocephalic and atraumatic. Conjunctivae clear. No congestion. Moist mucous membranes.   Cardiovascular: Rate as above, regular rhythm. Normal and symmetric distal pulses. Brisk capillary refill. Normal skin turgor.  Respiratory: Normal respiratory effort. Breath sounds are normal. There are no wheezing or crackles heard.  Gastrointestinal: Large umbilical hernia, contents tender and reducible, containing bowel based on palpated peristalsis of contents. Tender to palpation in the RLQ,  no tenderness in RUQ, LLQ, LUQ. Soft, non-distended, non-tender.  Genitourinary: Deferred.  Musculoskeletal: Non-tender with normal range of motion in all extremities.  Neurologic: Normal speech and language. No gross focal neurologic deficits are appreciated. Patient is moving all extremities equally, face is symmetric at rest and with speech.  Skin: Skin is warm, dry and intact. Extensive flat macular rash of the bilateral upper extremities with overlying excoriation.  Psychiatric: Mood and affect are normal. Speech and behavior are normal.       Media Information    Document Information    Photographic Image: Pictures   Arm rash   07/26/2024 12:41   Attached To:   Hospital Encounter on 07/26/24   Source Information    Anzal Bartnick, Damien CROME, MD - Emerg Dept Hbr        Radiology     CT Abdomen Pelvis W IV Contrast Only   Final Result   1.  Findings indicative of active inflammatory Crohn's disease involving the neoterminal ileum, similar, and rectum, new/increased.             Pertinent labs & imaging results that were available during my care of the patient were independently interpreted by me and considered in my medical decision making (see chart for details).    Portions of this record have been created using Scientist, clinical (histocompatibility and immunogenetics). Dictation errors have been sought, but Shivali Quackenbush not have been identified and corrected.           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    Myalgia     Rhinovirus infection 10/29/2022    SBO (small bowel obstruction) (CMS-HCC) 2011   [2]   Past Surgical History:  Procedure Laterality Date    CHOLECYSTECTOMY      COLON SURGERY      partial resection    HERNIA REPAIR      PR ARTHRP INTERCARPAL/CARP/MTCRPL JT INTERPOSITION Left 08/07/2022    Procedure: INTERPOSIT ARTHROPLASTY-INTERCARPAL/CARPOMETACAR;  Surgeon: Bonifacio Cordella HERO, MD;  Location: MAIN OR [facility];  Service: Orthopedics    PR CLOSE ENTEROSTOMY,RESEC+COLOREC ANAS Midline 11/01/2019    Procedure: CLO ENTEROSTOMY; W/RESECT AUGIE FLAKES;  Surgeon: Aloysius Euell Quale, MD;  Location: MAIN OR [place];  Service: Gastrointestinal    PR COLONOSCOPY FLX DX W/COLLJ SPEC WHEN PFRMD Left 01/07/2013    Procedure: COLONOSCOPY, FLEXIBLE, PROXIMAL TO SPLENIC FLEXURE; DIAGNOSTIC, W/WO COLLECTION SPECIMEN BY BRUSH OR WASH;  Surgeon: Liliane JENEANE Marker, MD;  Location: GI PROC[facility]URES [facility];  Service: Gastroenterology    PR COLONOSCOPY FLX DX W/COLLJ SPEC WHEN PFRMD  03/09/2015    Procedure: COLONOSCOPY, FLEXIBLE, PROXIMAL TO SPLENIC FLEXURE; DIAGNOSTIC, W/WO COLLECTION SPECIMEN BY BRUSH OR WASH;  Surgeon: Artist Jayson Leather, MD;  Location: GI PROCEDURES [facility];  Service: Gastroenterology    PR COLONOSCOPY FLX DX W/COLLJ SPEC WHEN PFRMD N/A 10/28/2019 Procedure: COLONOSCOPY, FLEXIBLE, PROXIMAL TO SPLENIC FLEXURE; DIAGNOSTIC, W/WO COLLECTION SPECIMEN BY BRUSH OR WASH;  Surgeon: Derick Bring, MD;  Location: GI PROCEDURES [facility];  Service: Gastroenterology    PR COLONOSCOPY FLX DX W/COLLJ SPEC WHEN PFRMD N/A 10/01/2022    Procedure: COLONOSCOPY, FLEXIBLE, PROXIMAL TO SPLENIC FLEXURE; DIAGNOSTIC, W/WO COLLECTION SPECIMEN BY BRUSH OR WASH;  Surgeon: Darrick Peck, MD;  Location: GI PROC[facility]URES [facility];  Service: Gastroenterology    PR COLONOSCOPY FLX DX W/COLLJ SPEC WHEN PFRMD N/A 11/30/2023    Procedure: COLONOSCOPY, FLEXIBLE, PROXIMAL TO SPLENIC FLEXURE; DIAGNOSTIC, W/WO COLLECTION SPECIMEN BY BRUSH OR WASH;  Surgeon: Audrey Lacinda Hun, MD;  Location: GI PROCEDURES [facility];  Service: Gastroenterology    PR COLONOSCOPY W/BIOPSY SINGLE/MULTIPLE N/A 04/08/2024    Procedure: COLONOSCOPY, FLEXIBLE, PROXIMAL TO SPLENIC FLEXURE; WITH BIOPSY, SINGLE OR MULTIPLE;  Surgeon: Celestina Comer Norris, MD;  Location: GI PROCEDURES [facility];  Service: Gastroenterology    PR EXPLORATORY OF ABDOMEN Midline 09/02/2019    Procedure: EXPLORATORY LAPAROTOMY, EXPLORATORY CELIOTOMY WITH OR WITHOUT BIOPSY(S);  Surgeon: Aloysius Euell Quale, MD;  Location: MAIN OR [facility];  Service: Gastrointestinal    PR EXPLORATORY OF ABDOMEN Midline 11/01/2019    Procedure: EXPLORATORY LAPAROTOMY, EXPLORATORY CELIOTOMY WITH OR WITHOUT BIOPSY(S);  Surgeon: Aloysius Euell Quale, MD;  Location: MAIN OR [facility];  Service: Gastrointestinal    PR FIX FINGER,VOLAR PLATE,I-P JT Left 08/07/2022    Procedure: REPAIR AND RECONSTRUCTION, FINGER, VOLAR PLATE, INTERPHALANGEAL JOINT;  Surgeon: Bonifacio Cordella HERO, MD;  Location: MAIN OR [facility];  Service: Orthopedics    PR FREEING BOWEL ADHESION,ENTEROLYSIS N/A 11/01/2019    Procedure: Enterolysis (Separt Proc);  Surgeon: Aloysius Euell Quale, MD;  Location: MAIN OR [facility];  Service: Gastrointestinal    PR FUSION MC-P JT Left 08/27/2023    Procedure: ARTHRODESIS, METACARPOPHALANGEAL JOINT, WITH OR WITHOUT INTERNAL FIXATION;  Surgeon: Bonifacio Cordella HERO, MD;  Location: OR [facility];  Service: Orthopedics    PR ILEOSCOPY THRU STOMA,BIOPSY N/A 10/14/2019    Procedure: LYNETT; LORN 1/MX;  Surgeon: Thedora Alm Plain, MD;  Location: GI PROC[facility]URES [facility];  Service: Gastroenterology    PR PART REMOVAL COLON W COLOSTOMY Midline 09/02/2019    Procedure: COLECTOMY, PARTIAL; WITH SKIN LEVEL CECOSTOMY OR COLOSTOMY;  Surgeon: Aloysius Euell Quale, MD;  Location: MAIN OR [facility];  Service: Gastrointestinal    PR REPAIR INCISIONAL HERNIA,REDUCIBLE Midline 09/02/2019    Procedure: REPAIR INIT INCISIONAL OR VENTRAL HERNIA; REDUCIBLE;  Surgeon: Arnette Darice Burnet, MD;  Location: MAIN OR [place];  Service: Trauma    PR UPPER GI ENDOSCOPY,BIOPSY N/A 01/07/2013    Procedure: UGI ENDOSCOPY; WITH BIOPSY, SINGLE OR MULTIPLE;  Surgeon: Liliane JENEANE Marker, MD;  Location: GI PROCEDURES MEMORIAL [place];  Service: Gastroenterology    PR UPPER GI ENDOSCOPY,BIOPSY N/A 10/14/2019    Procedure: UGI ENDOSCOPY; WITH BIOPSY, SINGLE OR MULTIPLE;  Surgeon: Thedora Alm Plain, MD;  Location: GI PROCEDURES [facility];  Service: Gastroenterology    PR UPPER GI ENDOSCOPY,DIAGNOSIS N/A 10/01/2022    Procedure: UGI ENDO, INCLUDE ESOPHAGUS, STOMACH, & DUODENUM &/OR JEJUNUM; DX W/WO COLLECTION SPECIMN, BY BRUSH OR WASH;  Surgeon: Darrick Peck, MD;  Location: GI PROCEDURES [facility];  Service: Gastroenterology   [3]   Current Facility-Administered Medications   Medication Dose Route Frequency Provider Last Rate Last Admin    hydrocortisone  2.5 % ointment 1 Application  1 Application Topical BID Shaft Corigliano L, MD   1 Application at 07/26/24 1314     Current Outpatient Medications   Medication Sig Dispense Refill    acetaminophen  (TYLENOL ) 500 MG tablet Take 2 tablets (1,000 mg  total) by mouth every eight (8) hours. 30 tablet 0    empty container Misc Use as directed 1 each 1    ergocalciferol-1,250 mcg, 50,000 unit, (DRISDOL) 1,250 mcg (50,000 unit) capsule Take 1 capsule (1,250 mcg total) by mouth once a week.      ferrous sulfate  325 (65 FE) MG tablet Take 1 tablet (325 mg total) by mouth in the morning.      FLONASE  ALLERGY RELIEF 50 mcg/actuation nasal spray 2 sprays into each nostril daily as needed.      guselkumab  (TREMFYA  PEN) 200 mg/2 mL PnIj Inject the contents of 1 pen (200 mg) under the skin every twenty-eight (28) days. 2 mL 5    hydrocortisone  2.5 % cream Apply topically two (2) times a day. 30 g 0    nicotine  (NICODERM CQ ) 14 mg/24 hr patch Place 1 patch on the skin daily. 28 patch 1    nicotine  polacrilex (NICORETTE MINI) lozenge 4 MG Apply 1 lozenge (4 mg total) to cheek every hour as needed for smoking cessation. 72 each 0    nicotine  polacrilex (NICORETTE) 2 mg gum Apply 1 each (2 mg total) to cheek every hour as needed for smoking cessation. 110 each 0    ondansetron  (ZOFRAN -ODT) 4 MG disintegrating tablet Take 1 tablet (4 mg total) by mouth every eight (8) hours as needed for nausea for up to 7 days. (Patient not taking: Reported on 06/16/2024) 21 tablet 0    OPTICHAMBER DIAMOND [facility] Spcr       predniSONE  (DELTASONE ) 20 MG tablet Take 2 tablets (40 mg total) by mouth daily for 7 days. 14 tablet 0    QUEtiapine  (SEROQUEL ) 100 MG tablet Take 1 tablet (100 mg total) by mouth nightly.      SYMBICORT  80-4.5 mcg/actuation inhaler Inhale 2 puffs  in the morning. 10.2 g 0    VENTOLIN  HFA 90 mcg/actuation inhaler Inhale 2 puffs every six (6) hours as needed. 18 g 0   [4]   Allergies  Allergen Reactions    Morphine  Itching and Rash     He gets light headed    Toradol [Ketorolac] Itching   [5]   Family History  Problem Relation Age of Onset    Breast cancer Mother     Diabetes Maternal Aunt     Colon cancer Maternal Grandmother     Prostate cancer Maternal Grandfather     Cancer Paternal Grandmother     Crohn's disease Neg Hx    [6]   Social History  Tobacco Use    Smoking status: Every Day     Current packs/day: 0.50     Average packs/day: 0.5 packs/day for 22.8 years (11.4 ttl pk-yrs)     Types: Cigarettes     Start date: 10/02/2001     Passive exposure: Current    Smokeless tobacco: Never    Tobacco comments:     10cpd   Vaping Use    Vaping status: Some Days    Substances: Nicotine     Passive vaping exposure: Yes   Substance Use Topics    Alcohol use: No     Alcohol/week: 0.0 standard drinks of alcohol    Drug use: Yes     Types: Marijuana     Comment: Smokes 2-3 joints/day        Marigold Mom L, MD  Resident  07/26/24 [phone]

## 2024-07-26 NOTE — H&P (Signed)
 [facility] Medicine   History and Physical       Assessment and Plan     Jerome Kennedy. is a 48 y.o. male who is presenting to [facility] with Exacerbation of Crohn's disease    (CMS-HCC), in the setting of the following pertinent/contributing co-morbidities: bipolar disorder.    Exacerbation of Crohn's disease    (CMS-HCC)     Abdominal pain, severe    Nausea and vomiting    Bloody stools     Immunocompromised state due to drug therapy  Chronic illness with severe exacerbation or progression that has a significant risk of morbidity without appropriate treatment  Presentation and CT imaging concerning for worsening flare/disease control. Currently HDS and appropriate for transfer to Main campus for GI luminal evaluation. He reports compliance with his guselkumab  (Tremfya ) and is able to tell me last dose. They call and ship it to him. However, he has not had good follow up in IBD clinic. Will consult social work to assess barriers to accessing care.   PLAN  - Clear liquid diet, currently not able to drink more than a few sips without vomiting  - Hold on additional steroids pending evaluation by GI luminal at Main campus, consult order placed  - Send GIPP, c diff, stool calprotectin. Repeat ESR, CRP in AM  - Symptom management with multimodal pain regimen, compazine  prn  - Serial abdominal exams: currently soft with reducible large hernia, +bs  - Monitor for fevers or other evidence of worsening clinical status  - Social work consult for high readmission risk, assessment of psychosocial barriers to care    Secondary/Additional Active Problems:      Dehydration with clinically significant hypernatremia (Na 150)  Still having significant nausea and vomited today twice. Requiring zofran  and compazine  prns in ED. Unable to adequately hydrate as a result. Will require free water  replacement with IV fluids.  - Plan to give 1L D5LR over 10 hours  - Repeat BMP in AM      Hypophosphatasia    Hypomagnesemia  Requiring IV repletion due to intolerance of oral intake and severity. Will switch to oral supplementation when possible.   - Repeat labs, may require additional IV repletion tonight  - Monitor for refeeding once eating/drinking again, it has been nearly a week now      Hypoalbuminemia  - Recommend empiric nutrition shake, MV once able to tolerate oral intake  - Nutrition consult      Iron  deficiency anemia  - Continue iron  once able to tolerate meds      Bipolar 1 disorder  - Continue seroquel  qhs      COPD (chronic obstructive pulmonary disease)  Has not filled any inhalers recently.   - PRN duonebs, not currently having any respiratory symptoms      Hx of deep venous thrombosis  - DVT prophylaxis ordered        Prophylaxis  -Lovenox     Diet  -Nutrition Therapy Clear Liquid    Code Status / HCDM   -Full Code, Unverified but presumed, based on previous history   -  HCDM (patient stated preference): Jerome Kennedy, Jerome Kennedy - Brother - [phone]    Anticipated Medically Ready for Discharge: Anticipated in 2-4 Days    Significant Comorbid Conditions:     -Dehydration POA requiring further investigation, treatment, or monitoring  -Hypomagnesemia, Hypophosphatemia POA requiring further investigation, treatment, or monitoring  -Hypernatremia POA requiring further investigation, treatment, or monitoring    Issues Impacting Complexity of Management:  -Need  for escalation to higher hospital-level of care from [facility] to [facility] for access to specialized services and increased monitoring    Medical Decision Making: Reviewed records from the following unique sources: recent ED notes, prior discharge summaries. Discussed the patient's management and/or test interpretation with ED Provider as summarized within this note    I personally spent greater than 75 minutes face-to-face and non-face-to-face in the care of this patient, which includes all pre, intra, and post visit time on the date of service.  All documented time was specific to the E/M visit and does not include any procedures that may have been performed.    Please Epic Chat me or page [facility] Admit [phone] with questions prior to transfer. Then page Main campus cross coverage provider. After 7am, please page the primary team.     Izetta Hammersmith, MD   Hospital Medicine     HPI      Jerome Kennedy. is a 48 y.o. male who is presenting to [facility] with Exacerbation of Crohn's disease    (CMS-HCC).    Severe disease with multiple prior abdominal surgeries. Multiple flares in the past year. Currently having 3+ watery bright red stools daily with associated N/V. Reports fever at home but not charted here. Having severe pain. CT with active inflammatory Crohn's disease involving the neoterminal ileum, similar, and rectum, new/increased. No evidence of bowel obstruction or abscess. He is unable to maintain oral intake. He was seen in ED 5 days ago and prescribed prednisone . Rx history looks like this may not have been filled but he reports he has been taking prednisone  20 daily for 5 days now without improvement. He reports good compliance with Tremfya , last dose 12/15. I do not see that he has any [place] GI appointments scheduled.     When I saw him in ED he was still in pain. Dry mouth with evidence of recently vomiting. Requesting nausea medication. Agreeable to transfer to Main.       Med Rec Confidence   I reviewed the Medication List. The current list is Accurate    Physical Exam   Temp:  [36.9 ??C (98.4 ??F)] 36.9 ??C (98.4 ??F)  Pulse:  [57-68] 68  Resp:  [17-20] 17  BP: (113-128)/(74-84) 113/74  SpO2:  [97 %-98 %] 98 %  Body mass index is 22.16 kg/m??.  GEN: Appears older than stated age, mild distress - in obvious pain  HEENT: Dry MM and lips, poor dentition, recent emesis, white sclera, normal hearing  PULM: No respiratory distress, on room air, no wheezing on exam  CARDS: Normal rate, well perfused/warm, no LEE  SKIN: Warm, dry, clothed exam  NEURO: Nonfocal, A&O  PSYCH: Appropriate, calm, normal speech

## 2024-07-26 NOTE — ED Triage Note (Signed)
 Pt here with 6 days of n/v/d. Hx of Crohn's disease. Pt seen on the 18th for similar but with no improvement. 2 emesis today with possible blood. Pain primarily in RLQ.

## 2024-07-27 LAB — COMPREHENSIVE METABOLIC PANEL
ALBUMIN: 2.7 g/dL — ABNORMAL LOW (ref 3.4–5.0)
ALKALINE PHOSPHATASE: 122 U/L — ABNORMAL HIGH (ref 46–116)
ALT (SGPT): 13 U/L (ref 10–49)
ANION GAP: 10 mmol/L (ref 5–14)
AST (SGOT): 17 U/L (ref <=34)
BILIRUBIN TOTAL: 0.5 mg/dL (ref 0.3–1.2)
BLOOD UREA NITROGEN: 6 mg/dL — ABNORMAL LOW (ref 9–23)
BUN / CREAT RATIO: 7
CALCIUM: 7.3 mg/dL — ABNORMAL LOW (ref 8.7–10.4)
CHLORIDE: 110 mmol/L — ABNORMAL HIGH (ref 98–107)
CO2: 23 mmol/L (ref 20.0–31.0)
CREATININE: 0.85 mg/dL (ref 0.73–1.18)
EGFR CKD-EPI (2021) MALE: >90 mL/min/1.73m2 (ref >=60)
GLUCOSE RANDOM: 71 mg/dL (ref 70–179)
POTASSIUM: 3.2 mmol/L — ABNORMAL LOW (ref 3.4–4.8)
PROTEIN TOTAL: 5.7 g/dL (ref 5.7–8.2)
SODIUM: 143 mmol/L (ref 135–145)

## 2024-07-27 LAB — SEDIMENTATION RATE: ERYTHROCYTE SEDIMENTATION RATE: 10 mm/h (ref 0–15)

## 2024-07-27 LAB — C-REACTIVE PROTEIN: C-REACTIVE PROTEIN: 9.4 mg/L (ref <=10.0)

## 2024-07-27 MED ADMIN — acetaminophen (TYLENOL) tablet 1,000 mg: 1000 mg | ORAL | @ 02:00:00

## 2024-07-27 MED ADMIN — acetaminophen (TYLENOL) tablet 1,000 mg: 1000 mg | ORAL | @ 20:00:00

## 2024-07-27 MED ADMIN — acetaminophen (TYLENOL) tablet 1,000 mg: 1000 mg | ORAL | @ 15:00:00

## 2024-07-27 MED ADMIN — nicotine (NICODERM CQ) 21 mg/24 hr patch 1 patch: 1 | TRANSDERMAL | @ 20:00:00

## 2024-07-27 MED ADMIN — pantoprazole (Protonix) EC tablet 40 mg: 40 mg | ORAL | @ 15:00:00

## 2024-07-27 MED ADMIN — diphenhydrAMINE (BENADRYL) capsule/tablet 25 mg: 25 mg | ORAL | @ 13:00:00

## 2024-07-27 MED ADMIN — magnesium sulfate 2gm/50mL IVPB: 2 g | INTRAVENOUS | @ 04:00:00

## 2024-07-27 MED ADMIN — enoxaparin (LOVENOX) syringe 40 mg: 40 mg | SUBCUTANEOUS | @ 02:00:00

## 2024-07-27 MED ADMIN — prochlorperazine (COMPAZINE) injection 5 mg: 5 mg | INTRAVENOUS | @ 02:00:00

## 2024-07-27 MED ADMIN — prochlorperazine (COMPAZINE) injection 5 mg: 5 mg | INTRAVENOUS | @ 18:00:00

## 2024-07-27 MED ADMIN — oxyCODONE (ROXICODONE) immediate release tablet 10 mg: 10 mg | ORAL | @ 04:00:00 | Stop: 2024-08-02

## 2024-07-27 MED ADMIN — oxyCODONE (ROXICODONE) immediate release tablet 10 mg: 10 mg | ORAL | @ 17:00:00 | Stop: 2024-08-02

## 2024-07-27 MED ADMIN — oxyCODONE (ROXICODONE) immediate release tablet 10 mg: 10 mg | ORAL | @ 11:00:00 | Stop: 2024-08-02

## 2024-07-27 MED ADMIN — lactated Ringers infusion: 100 mL/h | INTRAVENOUS | @ 20:00:00 | Stop: 2024-07-28

## 2024-07-27 MED ADMIN — hydrocortisone 2.5 % ointment 1 Application: 1 | TOPICAL | @ 17:00:00

## 2024-07-27 MED ADMIN — hydrocortisone 2.5 % ointment 1 Application: 1 | TOPICAL | @ 11:00:00

## 2024-07-27 MED ADMIN — potassium chloride ER tablet 40 mEq: 40 meq | ORAL | @ 20:00:00 | Stop: 2024-07-27

## 2024-07-27 MED ADMIN — potassium chloride ER tablet 40 mEq: 40 meq | ORAL | @ 15:00:00 | Stop: 2024-07-27

## 2024-07-27 MED ADMIN — HYDROmorphone (PF) (DILAUDID) injection 1 mg: 1 mg | INTRAVENOUS | @ 01:00:00 | Stop: 2024-07-29

## 2024-07-27 MED ADMIN — magnesium oxide (MAG-OX) tablet 800 mg: 800 mg | ORAL | @ 08:00:00 | Stop: 2024-07-27

## 2024-07-27 MED ADMIN — diphenhydrAMINE (BENADRYL) capsule/tablet 25 mg: 25 mg | ORAL | @ 05:00:00 | Stop: 2024-07-26

## 2024-07-27 NOTE — Consults (Signed)
 [facility]   Initial Consultation         Assessment and Recommendations:   Jerome Landin. is a 48 y.o. 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 The patient is seen in consultation at the request of Dunstan Atiayarne Akolbire, MD ([facility] for Crohn's disease.    Stricturing ileocolonic Crohns disease  History of multiple surgeries for stricturing Crohn's disease. Most recently has been on Tremfya  since 01/2024 and was attempting to establish care with [place] NP for IBD care but has missed several appointments this fall. Recent admissions x2 04/2024, 05/2024 with ongoing symptoms of nausea, vomiting, and RLQ abdominal pain. Prior admission 04/2024 was admitted with active ileal disease (Rutgeerts score I3-I4) and Campylobacter infection, symptoms improved following IV steroids and antimicrobials; Tremfya  was continued. In October 2025, he was admitted again for active disease, received a single dose of IV steroids with rapid symptom resolution, and no changes were made to his therapy.     Now he presents again with similar symptoms of RLQ abdominal pain, nausea, vomiting despite compliance with Tremfya  dosing every 28 days, with last dose on 07/21/24. CT shows evidence of ongoing neoterminal ileal inflammation and new rectal inflammation and distal colon inflammation with some upstream dilation but no high grade obstruction. CRP is normal at 9. Overall, suspect his Crohn's is not responding to Tremfya  given ongoing symptoms and evidence of inflammation on both CT and colonoscopy 04/2024. CRP is normal however is not always elevated in patient's with active disease and clinical picture is certainly consistent with ongoing inflammation. Will start high-dose IV steroids for active Crohn's disease and obtain CT enterography as part of restaging. Colonoscopy / flexible sigmoidoscopy will be deferred, as recent colonoscopy in September 2025 while on Tremfya  showed active I3-I4 Crohn's disease and is unlikely to change management given no change in therapies since that exam and we suspect he is not responding to Tremfya  still. Transition from Tremfya  to alternative or adjuvant therapy will be considered based on clinical response in outpatient setting.     Recommendations   Follow up GI pathogen panel, C. Difficile Assay  Start methylprednisolone  60 mg IV once daily for total of 3 days  Please print and provide patient with stool diary (page 7) and instruct patient to keep log of bowel movements while admitted   Obtain CT enterography   Start chemical DVT prophylaxis, as patient is at high risk for DVT in setting of IBD flare  Avoid narcotics for management of pain as use in IBD patients have been associated with higher risk of complications such as intestinal resection and may even been associated with increased risk of mortality  Trend daily CRP, CMP, CBC  Order Vitamin D25, vitamin B12, iron  panel, ferritin if not checked within the past 3-6 months; supplement if deficient    Issues Impacting Complexity of Management:  -The patient is on drug therapy with IV steroids that requires intensive monitoring for toxicity with scheduled blood glucose checks and daily CBC and CRP    Recommendations discussed with the patient's primary team. We will continue to follow along with you.  Subjective:     Crohn's history: stricturing ileocolonic Crohn's disease diagnosed in 1990s with surgeries including ileocecectomy (2003) complicated by SBO due to stricturing disease, followed by right hemicolectomy and end-ileostomy (08/2019), which was further complicated by short gut syndrome and high ostomy output, and he subsequently underwent takedown and reanastomosis (10/2019).     Previous therapies include Humira  (failed to improve symptoms), Rinvoq  (stopped after he saw intact tablets in his stool). Skyrizi considered but never started due to difficulty obtaining IV access on multiple occassions. He was started on Tremfya  01/2024.     This is his third admission since starting Tremfya , first in 04/2024 for worsening of symptoms with evidence of active ileal disease (Rutgeerts score I3-I4) and Campylobacter infection, improved with IV steroids and prednisone  taper, and continued Tremfya  (there was concern for missed or inadequate dose of Tremfya  prior to this admission), azithromycin  for campylobacter, and symptoms rapidly improved. He was admitted again 05/2024 with similar symptoms of acute right lower quadrant pain, nausea, and vomiting which rapidly improved without intervention. While CT imaging demonstrated ongoing inflammation of the neoterminal ileum, he ultimately did not receive steroids after an initial dose of solumedrol in the ED x1 given his symptoms resolved quickly. Tremfya  continued however missed his outpatient follow up appointment. 07/21/24 presented to ED with similar symptoms, was discharged on prednisone  taper however he never filled this prescription and is representing 07/26/2024 with nausea, vomiting, right lower quadrant pain, hematochezia, and increased bowel movements.     Interval history:  Presenting with several days of lower right abdominal pain, worsened by eating, associated with nausea and one episode of vomiting. He reported a fever up to 101.9??F at symptom onset, managed with Tylenol . His bowel movements increased from baseline of 2 BMs per day that are more formed to approximately four watery, loose stools daily, with urgency, nocturnal stools, and one episode of blood. He started Tremfya  01/2024 and currently takes it every 28 days, with the last dose on Thursday 07/21/24, reports has been taking as prescribed. Joint pain, typical during Crohn's flares, was also present recently. He is tolerating clear liquids slowly.      -I have reviewed the patient's prior records from [facility] as summarized in the HPI    Objective:   Temp:  [36.7 ??C (98.1 ??F)-36.9 ??C (98.4 ??F)] 36.8 ??C (98.2 ??F)  Pulse:  [57-73] 73  SpO2 Pulse:  [73] 73  Resp:  [16-20] 16  BP: (113-128)/(54-85) 126/85  SpO2:  [96 %-100 %] 100 %    Gen: WDWN male in NAD, answers questions appropriately  Abdomen: Soft, RLQ tenderness to palpation, surgical scars present. Large reducible ventral hernia, not tender to palpation   Extremities: No edema in the BLEs    Pertinent Labs & Studies:  -I have reviewed the patient's labs from 07/27/2024 which show stable Hgb, improving CRP, and sodium 150 which is improving.  CRP is 9    CTAP 07/26/24  GI TRACT: Status post right hemicolectomy and ileocecectomy. Stomach is distended. Duodenum is unremarkable. Submucosal hyperenhancement of the neoterminal ileum with mild dilation and wall thickening, similar (4:21, 28, 35). There is also wall thickening and submucosal hyperenhancement involving the rectum (4:61, 2:117), new. A few areas of wall thickening of the rectum and distal colon, some of which have mild upstream dilation (2:97 and 2:102 for example). No high-grade bowel obstruction.     04/2024 Colonoscopy  Impression:- Preparation of the colon was poor.                          -  Hemorrhoids found on perianal exam.                          - Crohn's disease with ileitis. Rutgeerts Score i3- i4.                          - Normal mucosa in the entire examined colon.                          - Dilated in the rectum, in the sigmoid colon, in the descending colon   and in the transverse colon.                          - The examination was otherwise normal on direct and retroflexion   views.                          - Stool in the entire examined colon.                         PATH        11/2023 Colonoscopy  Impression:- Preparation of the colon was inadequate.                          - Stool in the entire examined colon.                          - Patent end-to-side ileo-colonic anastomosis, characterized by only   very mild narrowing.                          - Simple Endoscopic Score for Crohn's Disease: 7, mucosal   inflammatory changes secondary to Crohn's disease with ileitis.   Rutgeerts i3. No colonic involvement on today's exam.

## 2024-07-27 NOTE — Procedures (Signed)
 VENOUS ACCESS ULTRASOUND PROCEDURE NOTE    Indications:   Poor venous access.    The Venous Access Team has assessed this patient for the placement of a PIV. Ultrasound guidance was necessary to obtain access.     Procedure Details:  Identity of the patient was confirmed via name, medical record number and date of birth. The availability of the correct equipment was verified.    The vein was identified for ultrasound catheter insertion.  Field was prepared with necessary supplies and equipment.  Probe cover and sterile gel utilized.  Insertion site was prepped with chlorhexidine  solution and allowed to dry.  The catheter extension was primed with normal saline. A(n) 22 gauge 1.75 catheter was placed in the R Forearm with 1attempt(s). See LDA for additional details.    Catheter aspirated, 3 mL blood return present. The catheter was then flushed with 10 mL of normal saline. Insertion site cleansed, and dressing applied per manufacturer guidelines. The catheter was inserted with difficulty due to poor vasculature by Legrand FORBES Devon, RN.    Primary RN was notified.     Thank you,     Legrand FORBES Devon, RN Venous Access Team   [phone]     Workup / Procedure Time:  30 minutes    See vein image in PACs.    Please follow [facility] guidelines for long PIV, deep vein medication contraindications for infusates.    [email or web address] Guidelines%2FIV Administration of Non-Antineoplastic Medications via Midline Catheters%[email or web address]%2FClinical Guidelines

## 2024-07-27 NOTE — Procedures (Signed)
 VENOUS ACCESS ULTRASOUND PROCEDURE NOTE    Indications:   Poor venous access.    The Venous Access Team has assessed this patient for the placement of a PIV. Ultrasound guidance was necessary to obtain access.     Procedure Details:  Identity of the patient was confirmed via name, medical record number and date of birth. The availability of the correct equipment was verified.    The vein was identified for ultrasound catheter insertion.  Field was prepared with necessary supplies and equipment.  Probe cover and sterile gel utilized.  Insertion site was prepped with chlorhexidine solution and allowed to dry.  The catheter extension was primed with normal saline. A(n) 22 gauge 1.75 catheter was placed in the L Forearm with 1attempt(s). See LDA for additional details.    Catheter aspirated, 2 mL blood return present. The catheter was then flushed with 10 mL of normal saline. Insertion site cleansed, and dressing applied per manufacturer guidelines. The catheter was inserted with difficulty due to poor vasculature by Suzen KATHEE Negri, RN.    Primary RN was notified. Only other suitable vein in the left arm at this time is to deep for IV placement. Did not assess right arm, IV was just taken out of this arm.     Thank you,     Suzen KATHEE Negri, RN Venous Access Team   [phone]     Workup / Procedure Time:  30 minutes    See vein image in PACs.    Please follow [facility] guidelines for long PIV, deep vein medication contraindications for infusates.    [email or web address]?[email or web address]%2FClinical Guidelines%2FIV Administration of Non-Antineoplastic Medications via Midline Catheters%2Epdf&parent=%[email or web address]%2FClinical Guidelines

## 2024-07-27 NOTE — Consults (Signed)
 Social Work  Psychosocial Assessment    Patient Name: Jerome Kennedy.   Medical Record Number: [id number]   Date of Birth: [birth year]  Sex: Male     SW spoke with patient via phone. Patient alert and oriented x4. Patient expressed that he lives alone at address on file.     Patient is able to do all ADL care independently at baseline. Patient does not utilize any DME or HH services. Patient expressed that he did not have any issues with transportation to and from appointments.     Patient may need taxi at discharge. Patient expresses that he does not have any issues with  housing, affording food, and transportation at this time. Patient expresses that he receives SSDI at this time.    At this time there were no identified SW needs. Primary SW will hand over case back to CM.     Referral  Referred by: Care Manager  Reason for Referral: Other - specify in comments  Comment: Social work consult for high readmission risk, assessment of psychosocial barriers to care    Extended Emergency Contact Information  Primary Emergency Contact: Parker,Latoya  Mobile Phone: [phone]  Relation: Friend  Secondary Emergency Contact: Gastelum,Bryant  Home Phone: [phone]  Mobile Phone: [phone]  Relation: Brother  Preferred language: ENGLISH  Interpreter needed? No    Legal Next of Kin / Guardian / POA / Advance Directives    HCDM (patient stated preference): Travious, [place] - Brother - [phone]    Advance Directive (Medical Treatment)  Does patient have an advance directive covering medical treatment?: Patient does not have advance directive covering medical treatment.    Health Care Decision Maker [HCDM] (Medical & Mental Health Treatment)  Healthcare Decision Maker: Patient does not wish to appoint a Health Care Decision Maker at this time  Information offered on HCDM, Medical & Mental Health advance directives:: Patient given information.         Discharge Planning  Discharge Planning Information:   Type of Residence Mailing Address:  [address]  [place] [address]    Medical Information   Past Medical History[1]    Past Surgical History[2]    Family History[3]    Financial Information   Primary Insurance: Payor: [place] [employer] / Plan: [place] [employer] / Product Type: *No Product type* /    Secondary Insurance: None   Prescription Coverage: Nurse, Learning Disability (listed above)   Preferred Pharmacy: [facility]  [place] [facility] - [place] - [address]    Barriers to taking medication: No    Transition Home   Transportation at time of discharge: Family/Friend's Private Vehicle    Anticipated changes related to Illness: none   Services in place prior to admission: N/A   Services anticipated for DC: N/A   Hemodialysis Prior to Admission: No    Readmission  Risk of Unplanned Readmission Score: UNPLANNED READMISSION SCORE: 58.6%  Readmitted Within the Last 30 Days?   Readmission Factors include: unable to assess    Social Drivers of Health  Social Drivers of Health     Food Insecurity: No Food Insecurity (07/27/2024)    Hunger Vital Sign     Worried About Running Out of Food in the Last Year: Never true     Ran Out of Food in the Last Year: Never true   Tobacco Use: High Risk (07/26/2024)    Patient History  Smoking Tobacco Use: Every Day     Smokeless Tobacco Use: Never     Passive Exposure: Current   Transportation Needs: No Transportation Needs (07/27/2024)    PRAPARE - Transportation     Lack of Transportation (Medical): No     Lack of Transportation (Non-Medical): No   Alcohol Use: Not on file   Housing: Low Risk (07/27/2024)    Housing     Within the past 12 months, have you ever stayed: outside, in a car, in a tent, in an overnight shelter, or temporarily in someone else's home (i.e. couch-surfing)?: No     Are you worried about losing your housing?: No   Physical Activity: Not on file   Utilities: Low Risk (05/16/2024)    Utilities     Within the past 12 months, have you been unable to get utilities (heat, electricity) when it was really needed?: No   Stress: Not on file   Interpersonal Safety: Not At Risk (07/26/2024)    Interpersonal Safety     Unsafe Where You Currently Live: No     Physically Hurt by Anyone: No     Abused by Anyone: No   Substance Use: Not on file (06/10/2023)   Intimate Partner Violence: Not At Risk (07/21/2024)    Humiliation, Afraid, Rape, and Kick questionnaire     Fear of Current or Ex-Partner: No     Emotionally Abused: No     Physically Abused: No     Sexually Abused: No   Social Connections: Not on File (04/18/2023)    Received from [employer]    Social Connections     Connectedness: 0   Financial Resource Strain: Low Risk (07/27/2024)    Overall Financial Resource Strain (CARDIA)     Difficulty of Paying Living Expenses: Not very hard   Health Literacy: Not on file   Internet Connectivity: Not on file       Social History  Support Systems/Concerns: Administrator and Psychiatric History  Psychosocial Stressors: Coping with health challenges/recent hospitalization      Psychological Issues/Information: No issues              Chemical Dependency: None              Outpatient Providers: Primary Care Provider      Legal: No legal issues      Ability to [employer]: No issues accessing community services           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    Myalgia     Rhinovirus infection 10/29/2022    SBO (small bowel obstruction) (CMS-HCC) 2011   [2]   Past Surgical  History:  Procedure Laterality Date    CHOLECYSTECTOMY      COLON SURGERY      partial resection    HERNIA REPAIR      PR ARTHRP INTERCARPAL/CARP/MTCRPL JT INTERPOSITION Left 08/07/2022    Procedure: INTERPOSIT ARTHROPLASTY-INTERCARPAL/CARPOMETACAR;  Surgeon: Bonifacio Cordella HERO, MD;  Location: MAIN OR [facility];  Service: Orthopedics    PR CLOSE ENTEROSTOMY,RESEC+COLOREC ANAS Midline 11/01/2019    Procedure: CLO ENTEROSTOMY; W/RESECT AUGIE FLAKES;  Surgeon: Aloysius Euell Quale, MD;  Location: MAIN OR [place];  Service: Gastrointestinal    PR COLONOSCOPY FLX DX W/COLLJ SPEC WHEN PFRMD Left 01/07/2013    Procedure: COLONOSCOPY, FLEXIBLE, PROXIMAL TO SPLENIC FLEXURE; DIAGNOSTIC, W/WO COLLECTION SPECIMEN BY BRUSH OR WASH;  Surgeon: Liliane JENEANE Marker, MD;  Location: GI PROCEDURES [facility];  Service: Gastroenterology    PR COLONOSCOPY FLX DX W/COLLJ SPEC WHEN PFRMD  03/09/2015    Procedure: COLONOSCOPY, FLEXIBLE, PROXIMAL TO SPLENIC FLEXURE; DIAGNOSTIC, W/WO COLLECTION SPECIMEN BY BRUSH OR WASH;  Surgeon: Artist Jayson Leather, MD;  Location: GI PROCEDURES [facility];  Service: Gastroenterology    PR COLONOSCOPY FLX DX W/COLLJ SPEC WHEN PFRMD N/A 10/28/2019    Procedure: COLONOSCOPY, FLEXIBLE, PROXIMAL TO SPLENIC FLEXURE; DIAGNOSTIC, W/WO COLLECTION SPECIMEN BY BRUSH OR WASH;  Surgeon: Derick Bring, MD;  Location: GI PROCEDURES [facility];  Service: Gastroenterology    PR COLONOSCOPY FLX DX W/COLLJ SPEC WHEN PFRMD N/A 10/01/2022    Procedure: COLONOSCOPY, FLEXIBLE, PROXIMAL TO SPLENIC FLEXURE; DIAGNOSTIC, W/WO COLLECTION SPECIMEN BY BRUSH OR WASH;  Surgeon: Darrick Peck, MD;  Location: GI PROCEDURES [facility];  Service: Gastroenterology    PR COLONOSCOPY FLX DX W/COLLJ SPEC WHEN PFRMD N/A 11/30/2023    Procedure: COLONOSCOPY, FLEXIBLE, PROXIMAL TO SPLENIC FLEXURE; DIAGNOSTIC, W/WO COLLECTION SPECIMEN BY BRUSH OR WASH;  Surgeon: Audrey Lacinda Hun, MD; Location: GI PROCEDURES [facility];  Service: Gastroenterology    PR COLONOSCOPY W/BIOPSY SINGLE/MULTIPLE N/A 04/08/2024    Procedure: COLONOSCOPY, FLEXIBLE, PROXIMAL TO SPLENIC FLEXURE; WITH BIOPSY, SINGLE OR MULTIPLE;  Surgeon: Celestina Comer Norris, MD;  Location: GI PROCEDURES [facility];  Service: Gastroenterology    PR EXPLORATORY OF ABDOMEN Midline 09/02/2019    Procedure: EXPLORATORY LAPAROTOMY, EXPLORATORY CELIOTOMY WITH OR WITHOUT BIOPSY(S);  Surgeon: Aloysius Euell Quale, MD;  Location: MAIN OR [facility];  Service: Gastrointestinal    PR EXPLORATORY OF ABDOMEN Midline 11/01/2019    Procedure: EXPLORATORY LAPAROTOMY, EXPLORATORY CELIOTOMY WITH OR WITHOUT BIOPSY(S);  Surgeon: Aloysius Euell Quale, MD;  Location: MAIN OR [facility];  Service: Gastrointestinal    PR FIX FINGER,VOLAR PLATE,I-P JT Left 08/07/2022    Procedure: REPAIR AND RECONSTRUCTION, FINGER, VOLAR PLATE, INTERPHALANGEAL JOINT;  Surgeon: Bonifacio Cordella HERO, MD;  Location: MAIN OR [facility];  Service: Orthopedics    PR FREEING BOWEL ADHESION,ENTEROLYSIS N/A 11/01/2019    Procedure: Enterolysis (Separt Proc);  Surgeon: Aloysius Euell Quale, MD;  Location: MAIN OR [facility];  Service: Gastrointestinal    PR FUSION MC-P JT Left 08/27/2023    Procedure: ARTHRODESIS, METACARPOPHALANGEAL JOINT, WITH OR WITHOUT INTERNAL FIXATION;  Surgeon: Bonifacio Cordella HERO, MD;  Location: OR [facility];  Service: Orthopedics    PR ILEOSCOPY THRU STOMA,BIOPSY N/A 10/14/2019    Procedure: LYNETT; LORN 1/MX;  Surgeon: Thedora Alm Plain, MD;  Location: GI PROCEDURES [facility];  Service: Gastroenterology    PR PART REMOVAL COLON W COLOSTOMY Midline 09/02/2019    Procedure: COLECTOMY, PARTIAL; WITH SKIN LEVEL CECOSTOMY OR COLOSTOMY;  Surgeon: Aloysius Euell Quale, MD;  Location: MAIN OR [facility];  Service: Gastrointestinal  PR REPAIR INCISIONAL HERNIA,REDUCIBLE Midline 09/02/2019    Procedure: REPAIR INIT INCISIONAL OR VENTRAL HERNIA; REDUCIBLE;  Surgeon: Arnette Darice Burnet, MD; Location: MAIN OR [place];  Service: Trauma    PR UPPER GI ENDOSCOPY,BIOPSY N/A 01/07/2013    Procedure: UGI ENDOSCOPY; WITH BIOPSY, SINGLE OR MULTIPLE;  Surgeon: Liliane JENEANE Marker, MD;  Location: GI PROCEDURES [facility];  Service: Gastroenterology    PR UPPER GI ENDOSCOPY,BIOPSY N/A 10/14/2019    Procedure: UGI ENDOSCOPY; WITH BIOPSY, SINGLE OR MULTIPLE;  Surgeon: Thedora Alm Plain, MD;  Location: GI PROCEDURES [facility];  Service: Gastroenterology    PR UPPER GI ENDOSCOPY,DIAGNOSIS N/A 10/01/2022    Procedure: UGI ENDO, INCLUDE ESOPHAGUS, STOMACH, & DUODENUM &/OR JEJUNUM; DX W/WO COLLECTION SPECIMN, BY BRUSH OR WASH;  Surgeon: Darrick Peck, MD;  Location: GI PROCEDURES [facility];  Service: Gastroenterology   [3]   Family History  Problem Relation Age of Onset    Breast cancer Mother     Diabetes Maternal Aunt     Colon cancer Maternal Grandmother     Prostate cancer Maternal Grandfather     Cancer Paternal Grandmother     Crohn's disease Neg Hx

## 2024-07-27 NOTE — Consults (Signed)
 Case Management Brief Assessment  Indicators for social work consultation identified.  Indicators include: (Social work consult for high readmission risk, assessment of psychosocial barriers to care )  Social worker consulted for psychosocial and discharge needs assessment.    CM will continue to follow for avoidable delays and opportunities for progression of care.   07/27/2024 8:41 AM       General:  Care Manager / Social Worker assessed the patient by : Medical record review, Discussion with Clinical Care team    Extended Emergency Contact Information  Primary Emergency Contact: Jerome Kennedy,Jerome Kennedy  Mobile Phone: [phone]  Relation: Friend  Secondary Emergency Contact: Jerome Kennedy,Jerome Kennedy  Home Phone: [phone]  Mobile Phone: [phone]  Relation: Brother  Preferred language: ENGLISH  Interpreter needed? No      Financial Information:  Need for financial assistance?: Other (Comment) (Social work consult for high readmission risk, assessment of psychosocial barriers to care)         Discharge Needs:  Concerns to be Addressed: care coordination/care conferences, discharge planning, other (see comments) (Social work consult for high readmission risk, assessment of psychosocial barriers to care)    Clinical risk factors: New Diagnosis      Discharge Plan:  Screen findings are: Discharge planning needs identified or anticipated (Comment). (CM/MSW will follow for DC needs.)    Estimated Discharge Date:     Initial Assessment complete?: Yes        Additional Information:    HCDM (patient stated preference): Jerome Kennedy, Jerome Kennedy - Brother - [phone]    Social Drivers of Health     Food Insecurity: No Food Insecurity (05/16/2024)    Hunger Vital Sign     Worried About Running Out of Food in the Last Year: Never true     Ran Out of Food in the Last Year: Never true   Tobacco Use: High Risk (07/26/2024)    Patient History     Smoking Tobacco Use: Every Day     Smokeless Tobacco Use: Never     Passive Exposure: Current Transportation Needs: No Transportation Needs (05/16/2024)    PRAPARE - Transportation     Lack of Transportation (Medical): No     Lack of Transportation (Non-Medical): No   Alcohol Use: Not on file   Housing: Low Risk (05/16/2024)    Housing     Within the past 12 months, have you ever stayed: outside, in a car, in a tent, in an overnight shelter, or temporarily in someone else's home (i.e. couch-surfing)?: No     Are you worried about losing your housing?: No   Physical Activity: Not on file   Utilities: Low Risk (05/16/2024)    Utilities     Within the past 12 months, have you been unable to get utilities (heat, electricity) when it was really needed?: No   Stress: Not on file   Interpersonal Safety: Not At Risk (07/26/2024)    Interpersonal Safety     Unsafe Where You Currently Live: No     Physically Hurt by Anyone: No     Abused by Anyone: No   Substance Use: Not on file (06/10/2023)   Intimate Partner Violence: Not At Risk (07/21/2024)    Humiliation, Afraid, Rape, and Kick questionnaire     Fear of Current or Ex-Partner: No     Emotionally Abused: No     Physically Abused: No     Sexually Abused: No   Social Connections: Not on File (04/18/2023)    Received from [facility]  Social Connections     Connectedness: 0   Financial Resource Strain: Low Risk (05/16/2024)    Overall Financial Resource Strain (CARDIA)     Difficulty of Paying Living Expenses: Not very hard   Health Literacy: Not on file   Internet Connectivity: Not on file       Predictive Model Details   No score data available for [facility] Risk of Unplanned Readmission

## 2024-07-27 NOTE — Progress Notes (Signed)
 Hospital Medicine Daily Progress Note    Assessment/Plan:    Principal Problem:    Exacerbation of Crohn's disease    (CMS-HCC)  Active Problems:    Abdominal pain    Nausea and vomiting    Hypoalbuminemia    Hypomagnesemia    Iron  deficiency anemia    Bipolar 1 disorder    (CMS-HCC)    COPD (chronic obstructive pulmonary disease) (CMS-HCC)    Hx of deep venous thrombosis    Bloody stools    Immunocompromised state due to drug therapy (HHS-HCC)    Dehydration with hypernatremia    Hypophosphatasia                 Jerome Kennedy. is a 48 y.o. male that presented to [facility] with Exacerbation of Crohn's disease    (CMS-HCC).    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  CT showed evidence of continued inflammation of neoterminal ileum consistent with prior scope when he had flare in September with ileal disease.  Assessed by GI and is believed to have acute infection given acute onset of symptoms.  Enteric precautions  Continue clear liquid diet and advance as tolerated  Follow up GI pathogen panel  Obtain stool calprotectin  Monitor CRP, CMP and CBC daily  Hold off on steroids pending GI recommendations  Continue oxycodone , Tylenol  as needed  Obtain CT enterography abdomen/pelvis with contrast  Follow-up GI recommendations    Hypovolemic hyponatremia  Patient presented with vomiting and poor oral intake and was found to have serum sodium of 150, improved with IV D5/LR.  Patient had contrast study and is scheduled for more contrast studies.  Continue IV hydration  Monitor BMP    Hypomagnesemia  Hypophosphatemia  Replete as needed  Follow-up magnesium  and potassium levels    Mild protein calorie malnutrition  Patient had albumin of 2.7  Protein supplementation  Follow-up nutrition consult recommendations    Iron  deficiency anemia  Hemoglobin stable  Resume iron  supplementation once patient stable  Monitor CBC  Keep hemoglobin greater than 7    Bipolar 1 disorder  Continue Seroquel  p.o. nightly    COPD not in exacerbation  Not on maintenance inhalers at home  DuoNebs as needed    I personally spent 46 minutes face-to-face and non-face-to-face in the care of this patient, which includes all pre, intra, and post visit time on the date of service.  All documented time was specific to the E/M visit and does not include any procedures that may have been performed.    ___________________________________________________________________    Subjective:  No acute events overnight.  The patient reported improvement in frequency of his bowel movements and abdominal pain.  He had no fever, chills, chest pain, palpitations and shortness of breath.  Patient was assessed by GI with concern for infection based on acute onset of symptoms.      Labs/Studies:  Labs and Studies from the last 24hrs per EMR and Reviewed    Objective:  Temp:  [36.7 ??C (98.1 ??F)-36.8 ??C (98.2 ??F)] 36.7 ??C (98.1 ??F)  Pulse:  [66-73] 67  SpO2 Pulse:  [73] 73  Resp:  [16-20] 20  BP: (113-126)/(54-85) 116/80  SpO2:  [96 %-100 %] 99 %    Physical examination  Middle-aged man lying comfortably in bed, not in distress  Chest clear to auscultation bilaterally  S1 + S2 +0, regular rhythm  Abdomen soft, RLQ tenderness, positive bowel sounds, incisional hernia reducible  Alert and oriented x 3, no focal neurologic deficits  No lower extremity edema

## 2024-07-27 NOTE — Procedures (Signed)
 PICC LINE REFERRAL NOTE    The Venous Access Team has assessed this patient for the placement of a PICC line.  This patient was found to be an inappropriate bedside PICC line  candidate due to history of obstructions, has required fluoroscopy in the past.    This patient has been referred for a VIR consult.  Recommend most appropriate device for patient with h/o central occlusions at this time.         CHARM Abbe, MD of the Hospitalist service has been notified.    Thank You,    Rosaline CHRISTELLA Hoots, RN Venous Access Team [phone]    Workup Time:  30 minutes

## 2024-07-27 NOTE — Procedures (Signed)
 VENOUS ACCESS ULTRASOUND PROCEDURE NOTE    Indications:   Poor venous access.    The Venous Access Team has assessed this patient for the placement of a PIV. Ultrasound guidance was necessary to obtain access.     Procedure Details:  Identity of the patient was confirmed via name, medical record number and date of birth. The availability of the correct equipment was verified.    The vein was identified for ultrasound catheter insertion.  Field was prepared with necessary supplies and equipment.  Probe cover and sterile gel utilized.  Insertion site was prepped with chlorhexidine solution and allowed to dry.  The catheter extension was primed with normal saline. A(n) 22 gauge 1.75 catheter was placed in the R Forearm with 1attempt(s). See LDA for additional details.    Catheter aspirated, 3 mL blood return present. The catheter was then flushed with 10 mL of normal saline. Insertion site cleansed, and dressing applied per manufacturer guidelines. The catheter was inserted without difficulty by Toribio VEAR Hugger, RN.    Care RN was notified.     Thank you,     Toribio VEAR Hugger, RN Venous Access Team   [phone]     Workup / Procedure Time:  30 minutes    See vein image in PACs.    Please follow [facility] guidelines for long PIV, deep vein medication contraindications for infusates.    [email or web address] [email or web address] Administration of Non-Antineoplastic Medications via Midline [email or web address]%2FClinical Guidelines

## 2024-07-27 NOTE — Plan of Care (Signed)
 VSS, afebrile, no falls noted during shift. Pt c/o 8/10 RLQ pain but fell asleep upon reassessment after administering PRN oxycodone . Pt understands he's on a clear liquid diet. Pt lost IV access after being ultrasound guided in HBR. Two bags of K+ and two bags of Mag were ordered. Primary RN and Charge couldn't find any, thus VAT was consulted. VAT stated for us  to be careful with the US  guided, newly placed one. K+ was running slow but again, pt lost IV access. We managed to give the pt one of the K+ bags.  Pt has history of Central Lines, however they have become infected. MD paged and they ordered PO Mag. Pt educated to call for assistance, thus call bell within reach. Pt is resting comfortably in bed, no complaints at this time.     Shift Summary  Pain escalated to severe levels in the right lower abdomen, requiring PRN oxyCODONE  administration, but pain remained constant.   Transfer to [facility] was completed, with all required reports and checklists documented.   Peripheral IV site was assessed and a new dressing was applied, with no complications noted.   Fall prevention strategies were maintained throughout the shift, including bed alarms, hourly checks, and toileting every two hours.   Overall, comfort and safety interventions were consistently implemented, and no new hospital-acquired issues or injuries were documented.     Absence of Hospital-Acquired Illness or Injury: No new skin or device-related issues were documented, and absorbent pads and limited adhesive use were consistently maintained throughout the shift. Peripheral IV site remained clean, dry, and intact with a new dressing applied and no rotation needed.     Optimal Comfort and Wellbeing: Pain in the right lower abdomen increased from moderate to severe during the shift, with pain scores rising from 2 to 7; oxyCODONE  was administered PRN, but pain remained constant and unchanged per clinical progression documentation.     Readiness for Transition of Care: Transfer to [facility] was completed by stretcher, with nursing report given and patient transported alone by wheelchair for subsequent movements.     Absence of Infection Signs and Symptoms: Temperature remained stable and within normal limits, and enteric isolation precautions were maintained throughout the shift.     Absence of Fall and Fall-Related Injury: Fall reduction interventions, bed/chair alarms, and hourly visual checks were consistently implemented, with no falls or injuries reported.

## 2024-07-28 LAB — CBC W/ AUTO DIFF
BASOPHILS ABSOLUTE COUNT: 0 10*9/L (ref 0.0–0.1)
BASOPHILS RELATIVE PERCENT: 0.9 %
EOSINOPHILS ABSOLUTE COUNT: 0.2 10*9/L (ref 0.0–0.5)
EOSINOPHILS RELATIVE PERCENT: 3.3 %
HEMATOCRIT: 38.3 % — ABNORMAL LOW (ref 39.0–48.0)
HEMOGLOBIN: 12.3 g/dL — ABNORMAL LOW (ref 12.9–16.5)
LYMPHOCYTES ABSOLUTE COUNT: 1.2 10*9/L (ref 1.1–3.6)
LYMPHOCYTES RELATIVE PERCENT: 24.7 %
MEAN CORPUSCULAR HEMOGLOBIN CONC: 32.2 g/dL (ref 32.0–36.0)
MEAN CORPUSCULAR HEMOGLOBIN: 27.8 pg (ref 25.9–32.4)
MEAN CORPUSCULAR VOLUME: 86.3 fL (ref 77.6–95.7)
MEAN PLATELET VOLUME: 8.2 fL (ref 6.8–10.7)
MONOCYTES ABSOLUTE COUNT: 0.5 10*9/L (ref 0.3–0.8)
MONOCYTES RELATIVE PERCENT: 9.2 %
NEUTROPHILS ABSOLUTE COUNT: 3.1 10*9/L (ref 1.8–7.8)
NEUTROPHILS RELATIVE PERCENT: 61.9 %
PLATELET COUNT: 209 10*9/L (ref 150–450)
RED BLOOD CELL COUNT: 4.43 10*12/L (ref 4.26–5.60)
RED CELL DISTRIBUTION WIDTH: 14 % (ref 12.2–15.2)
WBC ADJUSTED: 5 10*9/L (ref 3.6–11.2)

## 2024-07-28 LAB — BASIC METABOLIC PANEL
ANION GAP: 14 mmol/L (ref 5–14)
BLOOD UREA NITROGEN: 5 mg/dL — ABNORMAL LOW (ref 9–23)
BUN / CREAT RATIO: 6
CALCIUM: 7.5 mg/dL — ABNORMAL LOW (ref 8.7–10.4)
CHLORIDE: 110 mmol/L — ABNORMAL HIGH (ref 98–107)
CO2: 21 mmol/L (ref 20.0–31.0)
CREATININE: 0.87 mg/dL (ref 0.73–1.18)
EGFR CKD-EPI (2021) MALE: >90 mL/min/1.73m2 (ref >=60)
GLUCOSE RANDOM: 83 mg/dL (ref 70–179)
POTASSIUM: 3.7 mmol/L (ref 3.4–4.8)
SODIUM: 145 mmol/L (ref 135–145)

## 2024-07-28 LAB — C-REACTIVE PROTEIN: C-REACTIVE PROTEIN: 24 mg/L — ABNORMAL HIGH (ref <=10.0)

## 2024-07-28 MED ADMIN — acetaminophen (TYLENOL) tablet 1,000 mg: 1000 mg | ORAL | @ 02:00:00

## 2024-07-28 MED ADMIN — acetaminophen (TYLENOL) tablet 1,000 mg: 1000 mg | ORAL | @ 18:00:00

## 2024-07-28 MED ADMIN — nicotine (NICODERM CQ) 21 mg/24 hr patch 1 patch: 1 | TRANSDERMAL | @ 14:00:00

## 2024-07-28 MED ADMIN — pantoprazole (Protonix) EC tablet 40 mg: 40 mg | ORAL | @ 13:00:00

## 2024-07-28 MED ADMIN — diphenhydrAMINE (BENADRYL) capsule/tablet 25 mg: 25 mg | ORAL

## 2024-07-28 MED ADMIN — diphenhydrAMINE (BENADRYL) capsule/tablet 25 mg: 25 mg | ORAL | @ 18:00:00

## 2024-07-28 MED ADMIN — enoxaparin (LOVENOX) syringe 40 mg: 40 mg | SUBCUTANEOUS | @ 02:00:00

## 2024-07-28 MED ADMIN — prochlorperazine (COMPAZINE) injection 5 mg: 5 mg | INTRAVENOUS | @ 11:00:00

## 2024-07-28 MED ADMIN — predniSONE (DELTASONE) tablet 40 mg: 40 mg | ORAL | @ 20:00:00

## 2024-07-28 MED ADMIN — oxyCODONE (ROXICODONE) immediate release tablet 10 mg: 10 mg | ORAL | Stop: 2024-08-02

## 2024-07-28 MED ADMIN — oxyCODONE (ROXICODONE) immediate release tablet 10 mg: 10 mg | ORAL | @ 11:00:00 | Stop: 2024-08-02

## 2024-07-28 MED ADMIN — oxyCODONE (ROXICODONE) immediate release tablet 10 mg: 10 mg | ORAL | @ 18:00:00 | Stop: 2024-08-02

## 2024-07-28 MED ADMIN — oxyCODONE (ROXICODONE) immediate release tablet 10 mg: 10 mg | ORAL | @ 05:00:00 | Stop: 2024-08-02

## 2024-07-28 MED ADMIN — QUEtiapine (SEROQUEL) tablet 100 mg: 100 mg | ORAL | @ 02:00:00

## 2024-07-28 NOTE — Progress Notes (Signed)
 [facility]   Progress Note         Assessment and Recommendations:   Jerome Kennedy. is a 48 y.o. 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 The patient is seen in consultation at the request of Dunstan Atiayarne Akolbire, MD ([facility] for Crohn's disease.    Stricturing ileocolonic Crohns disease  History of multiple surgeries for stricturing Crohn's disease. Most recently has been on Tremfya  since 01/2024 and was attempting to establish care with [place] NP for IBD care but has missed several appointments this fall. Recent admissions x2 04/2024, 05/2024 with ongoing symptoms of nausea, vomiting, and RLQ abdominal pain. Prior admission 04/2024 with active ileal disease (Rutgeerts score I3-I4) and Campylobacter infection, symptoms improved following IV steroids and antimicrobials; Tremfya  was continued. In October 2025, he was admitted again for active disease, received a single dose of IV steroids with rapid symptom resolution, and no changes were made to his therapy.     This admission GIPP and C Diff negative. Feeling improved and ready for discharge tomorrow but of note, he has not received any steroids to this point and CRP is actually worse compared to admission. Will start PO prednisone  today. I anticipate that he will discharge tomorrow on steroid taper if continues to report improvement in symptoms. I stressed the importance of follow up with IBD clinic, which I will arrange. He undoubetly has active Crohn's disease but I do think there are other social situations at play impacting his care. Would consider social work consult.      Recommendations   Start pred 40mg  daily with plan to taper as below:   40x2 weeks>30x1 week>20x1weeks>10x1 week>off  Consider MRE while admitted (patient does not want CTE due to radiation exposure)  Continue chemical DVT prophylaxis, as patient is at high risk for DVT in setting of IBD flare  Avoid narcotics for management of pain as use in IBD patients have been associated with higher risk of complications such as intestinal resection and may even been associated with increased risk of mortality  Trend daily CRP, CMP, CBC  Order iron  panel and replete as appropriate if deficient  I will arrange for follow up with IBD as outpatient in the next few weeks     Issues Impacting Complexity of Management:  - None.    Recommendations discussed with the patient's primary team. We will continue to follow along with you.    Subjective:     NAEO. This morning patient reports that he is feeling better and ready for discharge tomorrow. Denies abdominal pain. Continues to have diarrhea. Eating well.    -I have reviewed the patient's prior records from [facility] as summarized in the HPI    Objective:   Temp:  [36.6 ??C (97.9 ??F)-36.8 ??C (98.2 ??F)] 36.8 ??C (98.2 ??F)  Pulse:  [79-82] 79  Resp:  [16-18] 16  BP: (107-117)/(76-78) 117/78  SpO2:  [95 %-98 %] 98 %    Gen: WDWN male in NAD, answers questions appropriately  Abdomen: Soft, NTND.    Pertinent Labs & Studies:  -I have reviewed the patient's labs from 07/28/2024  which show stable CBC, improved Na and Alk Phos on BMP. New elevated in CRP to 24.

## 2024-07-28 NOTE — Progress Notes (Signed)
 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 is a 48 y.o. male that presented to [facility] with Exacerbation of Crohn's disease    (CMS-HCC).    Stricturing ileocolonic Crohn's with acute flare, infectious diarrhea unlikely  Patient presented with frequent watery stools with bright red blood and associated nausea and vomiting.  CT showed evidence of continued inflammation of neoterminal ileum consistent with prior scope when he had flare in September with ileal disease. C diff negative and diarrhea resolved.  Discontinue Enteric precautions  Continue regular diet  Follow-up stool calprotectin  Start prednisone  40 mg p.o. daily  Monitor CRP, CMP and CBC daily  Continue oxycodone , Tylenol  as needed  Consider MR enterography abdomen/pelvis with contrast  Follow-up GI recommendations    Hypovolemic hyponatremia  Patient presented with vomiting and poor oral intake and was found to have serum sodium of 150, improved with IV D5/LR.    Ensure adequate hydration  Monitor BMP    Hypomagnesemia  Hypophosphatemia  Replete as needed  Follow-up magnesium  and potassium levels    Mild protein calorie malnutrition  Patient had albumin of 2.7  Protein supplementation  Follow-up nutrition consult recommendations    Iron  deficiency anemia  Hemoglobin stable  Resume iron  supplementation once patient stable  Monitor CBC  Keep hemoglobin greater than 7    Bipolar 1 disorder  Continue Seroquel  p.o. nightly    COPD not in exacerbation  Not on maintenance inhalers at home  DuoNebs as needed    I personally spent 38 minutes face-to-face and non-face-to-face in the care of this patient, which includes all pre, intra, and post visit time on the date of service.  All documented time was specific to the E/M visit and does not include any procedures that may have been performed.    ___________________________________________________________________    Subjective:  No acute events overnight.  The patient reported resolution of his diarrhea and abdominal pain.  He had no complaints this morning and declined CTE due to radiation exposure.  There was no fever, chills, chest pain, palpitations and shortness of breath.  He was advanced to regular diet and currently tolerating it.  On discussion with GI recommendation was made to initiate p.o. prednisone  as patient is unlikely to have infection in setting of diarrhea resolution.    Labs/Studies:  Labs and Studies from the last 24hrs per EMR and Reviewed    Objective:  Temp:  [36.6 ??C (97.9 ??F)-36.7 ??C (98.1 ??F)] 36.7 ??C (98.1 ??F)  Pulse:  [80-82] 82  Resp:  [16-18] 16  BP: (107-116)/(76-77) 116/76  SpO2:  [95 %-98 %] 98 %    Physical examination  Middle-aged man lying comfortably in bed, not in distress  Chest clear to auscultation bilaterally  S1 + S2 +0, regular rhythm  Abdomen soft, nontender, positive bowel sounds, incisional hernia  reducible  Alert and oriented x 3, no focal neurologic deficits  No lower extremity edema

## 2024-07-28 NOTE — Plan of Care (Signed)
 VSS, afebrile, no falls noted during shift. Pt c/o RLQ pain, thus PRN oxycodone  was administered twice during shift thus far. Upon reassessment, pt's pain was managed at a 1/10. Pt states that the hydrocortisone  cream doesn't really help anymore and would like to continue taking benadryl  table. New ultrasound guided IV placed early in the shift for the administration of LR. Pt educated to call for assistance, thus call bell within reach. Pt is resting comfortably in bed, no complaints at this time.     Shift Summary  OxyCODONE  was administered twice during the shift for pain management, resulting in decreased pain scores.    Peripheral IV site was assessed and a new dressing was applied, with site remaining clean, dry, and intact.    Fall reduction and safety interventions were consistently maintained, including bed alarm activation and hourly visual checks.    Patient maintained independence in feeding and bathing, with moderate assistance required for general hygiene.    Overall, patient remained stable with no major changes in clinical progression and readiness for transition of care was supported.     Readiness for Transition of Care: Unplanned readmission score decreased slightly over the shift, and patient remained able to feed self and transport independently by wheelchair; no major changes in clinical progression were documented.     Absence of Infection Signs and Symptoms: Temperature remained within normal range and enteric isolation precautions were consistently maintained.     Absence of Fall and Fall-Related Injury: Fall reduction program and safety interventions were maintained throughout the shift, with bed alarm activated and hourly visual checks showing patient remained in bed.     Improved Ability to Complete Activities of Daily Living: Patient was able to feed and bathe independently, required moderate assistance for general hygiene, and oral care was performed independently.

## 2024-07-29 LAB — CBC W/ AUTO DIFF
BASOPHILS ABSOLUTE COUNT: 0.1 10*9/L (ref 0.0–0.1)
BASOPHILS RELATIVE PERCENT: 0.8 %
EOSINOPHILS ABSOLUTE COUNT: 0.2 10*9/L (ref 0.0–0.5)
EOSINOPHILS RELATIVE PERCENT: 2.6 %
HEMATOCRIT: 43.9 % (ref 39.0–48.0)
HEMOGLOBIN: 14.1 g/dL (ref 12.9–16.5)
LYMPHOCYTES ABSOLUTE COUNT: 1.3 10*9/L (ref 1.1–3.6)
LYMPHOCYTES RELATIVE PERCENT: 22.1 %
MEAN CORPUSCULAR HEMOGLOBIN CONC: 32.2 g/dL (ref 32.0–36.0)
MEAN CORPUSCULAR HEMOGLOBIN: 27.9 pg (ref 25.9–32.4)
MEAN CORPUSCULAR VOLUME: 86.5 fL (ref 77.6–95.7)
MEAN PLATELET VOLUME: 8.4 fL (ref 6.8–10.7)
MONOCYTES ABSOLUTE COUNT: 0.5 10*9/L (ref 0.3–0.8)
MONOCYTES RELATIVE PERCENT: 8.7 %
NEUTROPHILS ABSOLUTE COUNT: 4 10*9/L (ref 1.8–7.8)
NEUTROPHILS RELATIVE PERCENT: 65.8 %
PLATELET COUNT: 225 10*9/L (ref 150–450)
RED BLOOD CELL COUNT: 5.07 10*12/L (ref 4.26–5.60)
RED CELL DISTRIBUTION WIDTH: 14.1 % (ref 12.2–15.2)
WBC ADJUSTED: 6.1 10*9/L (ref 3.6–11.2)

## 2024-07-29 LAB — C-REACTIVE PROTEIN: C-REACTIVE PROTEIN: 64 mg/L — ABNORMAL HIGH (ref <=10.0)

## 2024-07-29 MED ORDER — PREDNISONE 10 MG TABLET
ORAL_TABLET | ORAL | 0 refills | 34.00000 days | Status: CP
Start: 2024-07-29 — End: 2024-09-01
  Filled 2024-07-29: qty 94, 34d supply, fill #0

## 2024-07-29 MED ADMIN — acetaminophen (TYLENOL) tablet 1,000 mg: 1000 mg | ORAL | @ 02:00:00

## 2024-07-29 MED ADMIN — nicotine (NICODERM CQ) 21 mg/24 hr patch 1 patch: 1 | TRANSDERMAL | @ 15:00:00 | Stop: 2024-07-29

## 2024-07-29 MED ADMIN — pantoprazole (Protonix) EC tablet 40 mg: 40 mg | ORAL | @ 13:00:00 | Stop: 2024-07-29

## 2024-07-29 MED ADMIN — diphenhydrAMINE (BENADRYL) capsule/tablet 25 mg: 25 mg | ORAL | @ 11:00:00 | Stop: 2024-07-29

## 2024-07-29 MED ADMIN — diphenhydrAMINE (BENADRYL) capsule/tablet 25 mg: 25 mg | ORAL

## 2024-07-29 MED ADMIN — enoxaparin (LOVENOX) syringe 40 mg: 40 mg | SUBCUTANEOUS | @ 02:00:00

## 2024-07-29 MED ADMIN — prochlorperazine (COMPAZINE) injection 5 mg: 5 mg | INTRAVENOUS | @ 02:00:00

## 2024-07-29 MED ADMIN — predniSONE (DELTASONE) tablet 40 mg: 40 mg | ORAL | @ 13:00:00 | Stop: 2024-07-29

## 2024-07-29 MED ADMIN — oxyCODONE (ROXICODONE) immediate release tablet 10 mg: 10 mg | ORAL | @ 11:00:00 | Stop: 2024-07-29

## 2024-07-29 MED ADMIN — hydrocortisone 2.5 % ointment 1 Application: 1 | TOPICAL | @ 02:00:00

## 2024-07-29 MED ADMIN — hydrocortisone 2.5 % ointment 1 Application: 1 | TOPICAL | @ 15:00:00 | Stop: 2024-07-29

## 2024-07-29 MED ADMIN — QUEtiapine (SEROQUEL) tablet 100 mg: 100 mg | ORAL | @ 02:00:00

## 2024-07-29 NOTE — Discharge Summary (Signed)
 Physician Discharge Summary [facility]  1 [facility]  [address]  [place] [address]  Dept: [phone]  Loc: [phone]     Identifying Information:   Jerome Kennedy  05-Dec-1975  [id number]    Primary Care Physician: Pcp, None Per Patient   Code Status: Full Code    Admit Date: 07/26/2024    Discharge Date: 07/29/2024     Discharge To: Home    Discharge Service: [facility] - Hospital[facility] [place]     Discharge Attending Physician: Jacklyn Wilburt Gentle, MD    Discharge Diagnoses:  Principal Problem:    Exacerbation of Crohn's disease    (CMS-HCC) (POA: Yes)  Active Problems:    Abdominal pain (POA: Yes)    Nausea and vomiting (POA: Yes)    Hypoalbuminemia (POA: Yes)    Hypomagnesemia (POA: Yes)    Iron  deficiency anemia (POA: Yes)    Bipolar 1 disorder    (CMS-HCC) (POA: Yes)    COPD (chronic obstructive pulmonary disease) (CMS-HCC) (POA: Yes)    Hx of deep venous thrombosis (POA: Not Applicable)    Bloody stools (POA: Yes)    Immunocompromised state due to drug therapy (HHS-HCC) (POA: Not Applicable)    Dehydration with hypernatremia (POA: Yes)    Hypophosphatasia (POA: Yes)  Resolved Problems:    Hypernatremia (POA: Yes)    Hospital Course:   Jerome Ebron. is a 48 year old male with a history of stricturing ileocolonic Crohn's disease, multiple prior abdominal surgeries, bipolar 1 disorder, COPD, and prior DVT, who was admitted on 07/26/2024 for an acute exacerbation of Crohn's disease with associated abdominal pain, nausea, vomiting, and bloody stools.    PROBLEM-ORIENTED HOSPITAL SUMMARY    Stricturing ileocolonic Crohn's with acute flare, infectious diarrhea unlikely   He presented with several days of right lower quadrant abdominal pain, increased frequency of watery stools with bright red blood, nausea, and vomiting. CT imaging demonstrated ongoing inflammation of the neoterminal ileum and new rectal and distal colon inflammation, without evidence of high-grade obstruction. He was unable to maintain oral intake on admission. He reported compliance with Tremfya  (guselkumab ), last dose 07/21/24, but had not established consistent IBD clinic follow-up. Infectious workup including C. difficile was negative, and diarrhea resolved during hospitalization. He was transitioned from clear liquids to a regular diet as tolerated. Oral prednisone  was started after symptom improvement and negative infectious workup. He required multimodal pain management, including PRN oxycodone , with pain control achieved by discharge. GI recommended outpatient prednisone  taper and follow-up for ongoing management and consideration of therapy adjustment due to suspected inadequate response to Tremfya . He was maintained on enteric precautions until infection was excluded, then these were discontinued.    Dehydration with Hypernatremia   On admission, he was found to have dehydration with hypernatremia (Na 150) secondary to poor oral intake and ongoing GI losses. He required IV D5/LR for free water  replacement, with improvement in sodium documented during hospitalization.    Electrolyte Abnormalities: Hypomagnesemia, Hypophosphatemia   He developed hypomagnesemia and hypophosphatemia, requiring IV repletion due to intolerance of oral intake and severity of deficits. He was transitioned to oral supplementation as tolerated, with ongoing monitoring and repletion as needed.    Hypoalbuminemia and Mild Protein-Calorie Malnutrition   He was noted to have hypoalbuminemia (albumin 2.7) and mild protein-calorie malnutrition. Nutrition consult was obtained, and empiric nutrition shakes and multivitamin were recommended once oral intake improved.    Iron  Deficiency Anemia   He had a history of iron  deficiency anemia with  stable hemoglobin during admission. Iron  supplementation was resumed on discharge.    Immunocompromised State Due to Drug Therapy   He was immunocompromised due to ongoing biologic therapy for Crohn's disease and recent steroid exposure. This influenced the timing of steroid initiation and infection monitoring.    Bipolar 1 Disorder   He has a history of bipolar 1 disorder and continued seroquel  nightly during hospitalization.    COPD not in exacerbation  He has COPD without evidence of acute exacerbation during this admission. He was not on maintenance inhalers at home and received DuoNebs as needed.    History of Deep Venous Thrombosis   He has a history of DVT and received chemical DVT prophylaxis during hospitalization due to increased risk in the setting of IBD flare.    Vascular Access Issues   He required multiple ultrasound-guided peripheral IV placements due to poor venous access and a history of central line infections and occlusions. He was not a candidate for bedside PICC placement and was referred for VIR consult for central access planning.    Psychosocial and Discharge Planning   Social work and case management were consulted due to high readmission risk and barriers to care, including missed IBD clinic appointments and prior homelessness. He reported no current issues with housing, food, or transportation, and receives SSDI. Discharge planning included arranging outpatient IBD follow-up and education on the importance of adherence to therapy and appointments.        Procedures:     No admission procedures for hospital encounter.  ______________________________________________________________________  Discharge Medications:     Your Medication List        START taking these medications      predniSONE  10 MG tablet  Commonly known as: DELTASONE   Take 4 tablets (40 mg total) by mouth daily for 13 days, THEN 3 tablets (30 mg total) daily for 7 days, THEN 2 tablets (20 mg total) daily for 7 days, THEN 1 tablet (10 mg total) daily for 7 days.  Start taking on: July 29, 2024            CONTINUE taking these medications      acetaminophen  500 MG tablet  Commonly known as: TYLENOL   Take 2 tablets (1,000 mg total) by mouth every eight (8) hours.     empty container Misc  Use as directed     ferrous sulfate  325 (65 FE) MG tablet  Take 1 tablet (325 mg total) by mouth in the morning.     FLONASE  ALLERGY RELIEF 50 mcg/actuation nasal spray  Generic drug: fluticasone  propionate  2 sprays into each nostril daily as needed.     hydrocortisone  2.5 % cream  Apply topically two (2) times a day.     nicotine  14 mg/24 hr patch  Commonly known as: NICODERM CQ   Place 1 patch on the skin daily.     OPTICHAMBER DIAMOND VHC Spcr  Generic drug: inhalational spacing device     pantoprazole  40 MG tablet  Commonly known as: Protonix   Take 1 tablet (40 mg total) by mouth daily before breakfast.     QUEtiapine  100 MG tablet  Commonly known as: SEROQUEL   Take 1 tablet (100 mg total) by mouth nightly.     SYMBICORT  80-4.5 mcg/actuation inhaler  Generic drug: budesonide -formoterol   Inhale 2 puffs  in the morning.     TREMFYA  PEN 200 mg/2 mL Pnij  Generic drug: guselkumab   Inject the contents of 1 pen (200 mg) under the skin every twenty-eight (  28) days.     VENTOLIN  HFA 90 mcg/actuation inhaler  Generic drug: albuterol   Inhale 2 puffs every six (6) hours as needed.              Allergies:  Morphine  and Toradol [ketorolac]  ______________________________________________________________________  Pending Test Results (if blank, then none):  Pending Labs       Order Current Status    Calprotectin, Stool In process            Most Recent Labs:  All lab results last 24 hours -   Recent Results (from the past 24 hours)   C-reactive protein    Collection Time: 07/29/24  7:23 AM   Result Value Ref Range    CRP 64.0 (H) <=10.0 mg/L   CBC w/ Differential    Collection Time: 07/29/24  7:23 AM   Result Value Ref Range    WBC 6.1 3.6 - 11.2 10*9/L    RBC 5.07 4.26 - 5.60 10*12/L    HGB 14.1 12.9 - 16.5 g/dL    HCT 56.0 60.9 - 51.9 %    MCV 86.5 77.6 - 95.7 fL    MCH 27.9 25.9 - 32.4 pg    MCHC 32.2 32.0 - 36.0 g/dL    RDW 85.8 87.7 - 84.7 %    MPV 8.4 6.8 - 10.7 fL    Platelet 225 150 - 450 10*9/L    Neutrophils % 65.8 %    Lymphocytes % 22.1 %    Monocytes % 8.7 %    Eosinophils % 2.6 %    Basophils % 0.8 %    Absolute Neutrophils 4.0 1.8 - 7.8 10*9/L    Absolute Lymphocytes 1.3 1.1 - 3.6 10*9/L    Absolute Monocytes 0.5 0.3 - 0.8 10*9/L    Absolute Eosinophils 0.2 0.0 - 0.5 10*9/L    Absolute Basophils 0.1 0.0 - 0.1 10*9/L       Relevant Studies/Radiology (if blank, then none):  CT Abdomen Pelvis W IV Contrast Only  Result Date: 07/26/2024  EXAM: CT ABDOMEN PELVIS W CONTRAST ACCESSION: [id number] UN REPORT DATE: 07/26/2024 2:57 PM CLINICAL INDICATION: 48 years old with chron's, diarrhea, severe abd pain, BRBPR COMPARISON: CT 07/21/2024. TECHNIQUE: A helical CT scan of the abdomen and pelvis was obtained following IV contrast from the lung bases through the pubic symphysis. Images were reconstructed in the axial plane. Coronal and sagittal reformatted images were also provided for further evaluation. FINDINGS: LOWER CHEST: Unremarkable. LIVER: Normal liver contour. Stable linear hypoattenuation in the right hepatic lobe (4:38), which may be sequela of prior insult. BILIARY: Cholecystectomy. No biliary ductal dilatation. SPLEEN: Normal in size and contour. PANCREAS: Normal pancreatic contour. No focal lesions. No ductal dilation. ADRENAL GLANDS: Normal appearance of the adrenal glands. KIDNEYS/URETERS: Symmetric renal enhancement. No hydronephrosis. No solid renal mass. BLADDER: Unremarkable. REPRODUCTIVE ORGANS: Unremarkable. GI TRACT: Status post right hemicolectomy and ileocecectomy. Stomach is distended. Duodenum is unremarkable. Submucosal hyperenhancement of the neoterminal ileum with mild dilation and wall thickening, similar (4:21, 28, 35). There is also wall thickening and submucosal hyperenhancement involving the rectum (4:61, 2:117), new. A few areas of wall thickening of the rectum and distal colon, some of which have mild upstream dilation (2:97 and 2:102 for example). No high-grade bowel obstruction. PERITONEUM, RETROPERITONEUM AND MESENTERY: No free air. No ascites. LYMPH NODES: Prominent mesenteric lymph nodes, similar to prior and likely related to inflammatory bowel disease. VESSELS: Hepatic and portal veins are patent. Normal caliber aorta. Prominent subcutaneous varices and  enlargement of the azygos and hemiazygos vessels, similar. BONES and SOFT TISSUES: Moderate degenerative changes at L4-L5 with mild retrolisthesis of L4 on L5. Rectus diastasis. Postsurgical changes of the anterior abdominal wall.     1.  Findings indicative of active inflammatory Crohn's disease involving the neoterminal ileum, similar, and rectum, new/increased.    ______________________________________________________________________  Discharge Instructions:     Diet Instructions       Discharge diet (specify)      Discharge Nutrition Therapy: Regular            Follow Up instructions and Outpatient Referrals     Discharge instructions          Other Instructions       Discharge instructions      Expect a call with GI follow up appointment details              ______________________________________________________________________  Discharge Day Services:  BP 113/86  - Pulse 89  - Temp 36.9 ??C (98.4 ??F) (Oral)  - Resp 18  - Ht 175.3 cm (5' 9)  - Wt 66.6 kg (146 lb 13.2 oz)  - SpO2 98%  - BMI 21.68 kg/m??   Pt seen on the day of discharge and determined appropriate for discharge.    Condition at Discharge: stable    Length of Discharge: I spent greater than 30 mins in the discharge of this patient.

## 2024-07-29 NOTE — Plan of Care (Signed)
 Shift Summary  prochlorperazine  was given PRN for nausea and vomiting, with no further episodes documented after administration.   Skin and fall prevention interventions were maintained, including frequent repositioning, use of incontinence pads, and hourly visual checks.  Maintained independence with most ADLs, requiring only moderate assistance for general hygiene at times.  Pain was well controlled throughout the shift, with no additional interventions needed.  Overall, remained stable with effective symptom management and safety interventions in place.    Absence of Hospital-Acquired Illness or Injury: Skin protection measures such as limited adhesive use and incontinence pads were consistently applied, and frequent repositioning was performed throughout the shift; no new skin or device-related concerns were documented.    Absence of Fall and Fall-Related Injury: Fall reduction interventions, including frequent toileting, environmental modifications, and use of nonskid footwear, were maintained; hourly visual checks were performed and the bed alarm was activated late in the shift.    Improved Ability to Complete Activities of Daily Living: Remained independent with bathing and oral care, and was able to feed self throughout the shift; moderate assistance was intermittently required for general hygiene tasks.    Nausea and Vomiting Relief: prochlorperazine  was administered PRN and no further documentation of nausea or vomiting was noted after administration.    Optimal Pain Control and Function: Pain was consistently reported as 0 on the 0-10 scale, and no additional pain interventions were required during the shift.

## 2024-07-29 NOTE — Hospital Course (Signed)
 Jerome Kennedy. is a 48 year old male with a history of stricturing ileocolonic Crohn's disease, multiple prior abdominal surgeries, bipolar 1 disorder, COPD, and prior DVT, who was admitted on 07/26/2024 for an acute exacerbation of Crohn's disease with associated abdominal pain, nausea, vomiting, and bloody stools.    PROBLEM-ORIENTED HOSPITAL SUMMARY    Stricturing ileocolonic Crohn's with acute flare, infectious diarrhea unlikely   He presented with several days of right lower quadrant abdominal pain, increased frequency of watery stools with bright red blood, nausea, and vomiting. CT imaging demonstrated ongoing inflammation of the neoterminal ileum and new rectal and distal colon inflammation, without evidence of high-grade obstruction. He was unable to maintain oral intake on admission. He reported compliance with Tremfya  (guselkumab ), last dose 07/21/24, but had not established consistent IBD clinic follow-up. Infectious workup including C. difficile was negative, and diarrhea resolved during hospitalization. He was transitioned from clear liquids to a regular diet as tolerated. Oral prednisone  was started after symptom improvement and negative infectious workup. He required multimodal pain management, including PRN oxycodone , with pain control achieved by discharge. GI recommended outpatient prednisone  taper and follow-up for ongoing management and consideration of therapy adjustment due to suspected inadequate response to Tremfya . He was maintained on enteric precautions until infection was excluded, then these were discontinued.    Dehydration with Hypernatremia   On admission, he was found to have dehydration with hypernatremia (Na 150) secondary to poor oral intake and ongoing GI losses. He required IV D5/LR for free water  replacement, with improvement in sodium documented during hospitalization.    Electrolyte Abnormalities: Hypomagnesemia, Hypophosphatemia   He developed hypomagnesemia and hypophosphatemia, requiring IV repletion due to intolerance of oral intake and severity of deficits. He was transitioned to oral supplementation as tolerated, with ongoing monitoring and repletion as needed.    Hypoalbuminemia and Mild Protein-Calorie Malnutrition   He was noted to have hypoalbuminemia (albumin 2.7) and mild protein-calorie malnutrition. Nutrition consult was obtained, and empiric nutrition shakes and multivitamin were recommended once oral intake improved.    Iron  Deficiency Anemia   He had a history of iron  deficiency anemia with stable hemoglobin during admission. Iron  supplementation was resumed on discharge.    Immunocompromised State Due to Drug Therapy   He was immunocompromised due to ongoing biologic therapy for Crohn's disease and recent steroid exposure. This influenced the timing of steroid initiation and infection monitoring.    Bipolar 1 Disorder   He has a history of bipolar 1 disorder and continued seroquel  nightly during hospitalization.    COPD not in exacerbation  He has COPD without evidence of acute exacerbation during this admission. He was not on maintenance inhalers at home and received DuoNebs as needed.    History of Deep Venous Thrombosis   He has a history of DVT and received chemical DVT prophylaxis during hospitalization due to increased risk in the setting of IBD flare.    Vascular Access Issues   He required multiple ultrasound-guided peripheral IV placements due to poor venous access and a history of central line infections and occlusions. He was not a candidate for bedside PICC placement and was referred for VIR consult for central access planning.    Psychosocial and Discharge Planning   Social work and case management were consulted due to high readmission risk and barriers to care, including missed IBD clinic appointments and prior homelessness. He reported no current issues with housing, food, or transportation, and receives SSDI. Discharge planning included arranging outpatient IBD follow-up and education on  the importance of adherence to therapy and appointments.

## 2024-08-23 NOTE — Progress Notes (Signed)
 [facility] Refill Coordination Note    Specialty Medication(s) to be Shipped:   Inflammatory Disorders: Tremfya     Other medication(s) to be shipped: No additional medications requested for fill at this time    Specialty Medications not needed at this time: N/A     Jerome Kennedy., DOB: 14-Jul-1976  Phone: [phone] (home)       All above HIPAA information was verified with patient.     Was a nurse, learning disability used for this call? No    Completed refill call assessment today to schedule patient's medication shipment from the [facility]  [phone]).  All relevant notes have been reviewed.     Specialty medication(s) and dose(s) confirmed: Regimen is correct and unchanged.   Changes to medications: Jerome Kennedy reports no changes at this time.  Changes to insurance: No  New side effects reported not previously addressed with a pharmacist or physician: None reported  Questions for the pharmacist: No    Confirmed patient received a Conservation Officer, Historic Buildings and a Surveyor, Mining with first shipment. The patient will receive a drug information handout for each medication shipped and additional FDA Medication Guides as required.       DISEASE/MEDICATION-SPECIFIC INFORMATION        N/A    SPECIALTY MEDICATION ADHERENCE     Medication Adherence    Patient reported X missed doses in the last month: 0  Specialty Medication: TREMFYA  PEN 200 mg/2 mL  Patient is on additional specialty medications: No              Were doses missed due to medication being on hold? No    Tremfya  200/2 mg/ml: 0 doses of medicine on hand        Specialty medication is an injection or given on a cycle: Yes, Next injection is scheduled for 1/23.    REFERRAL TO PHARMACIST     Referral to the pharmacist: Not needed      [facility]     Shipping address confirmed in Epic.     Cost and Payment: Patient has a copay of $4. They are aware and have authorized the pharmacy to charge the credit card on file.    Delivery Scheduled: Yes, Expected medication delivery date: 08/25/24.     Medication will be delivered via Next Day Courier to the prescription address in Epic [place].    Jerome Kennedy   [facility]  Specialty Technician

## 2024-08-24 NOTE — Progress Notes (Signed)
 Jerome Kennedy. 's Tremfya  shipment will be delayed as a result of credit card ending in [id number] declined     I have reached out to the patient  via MyChart message and text and communicated the delay. We will wait for a call back from the patient to reschedule the delivery.  We have not confirmed the new delivery date.

## 2024-08-25 MED FILL — TREMFYA PEN 200 MG/2 ML SUBCUTANEOUS PEN INJECTOR: SUBCUTANEOUS | 28 days supply | Qty: 2 | Fill #5

## 2024-08-25 NOTE — Progress Notes (Signed)
 Jerome Kennedy. 's Tremfya  shipment will be sent out as a result of pt is unable to pay copay at this time [facility])    I have reached out to the patient  at [phone] and communicated the delivery change. We will reschedule the medication for the delivery date that the patient agreed upon.  We have confirmed the delivery date as 1/23, via next day courier.

## 2024-08-27 ENCOUNTER — Inpatient Hospital Stay: Admit: 2024-08-27 | Discharge: 2024-08-29 | Payer: Medicaid (Managed Care)

## 2024-08-27 ENCOUNTER — Ambulatory Visit: Admit: 2024-08-27 | Discharge: 2024-08-29 | Payer: Medicaid (Managed Care)

## 2024-08-27 LAB — CBC W/ AUTO DIFF
BASOPHILS ABSOLUTE COUNT: 0 10*9/L (ref 0.0–0.1)
BASOPHILS RELATIVE PERCENT: 0.6 %
EOSINOPHILS ABSOLUTE COUNT: 0.1 10*9/L (ref 0.0–0.5)
EOSINOPHILS RELATIVE PERCENT: 1.9 %
HEMATOCRIT: 38.9 % — ABNORMAL LOW (ref 39.0–48.0)
HEMOGLOBIN: 13 g/dL (ref 12.9–16.5)
LYMPHOCYTES ABSOLUTE COUNT: 1.4 10*9/L (ref 1.1–3.6)
LYMPHOCYTES RELATIVE PERCENT: 24.5 %
MEAN CORPUSCULAR HEMOGLOBIN CONC: 33.4 g/dL (ref 32.0–36.0)
MEAN CORPUSCULAR HEMOGLOBIN: 28.7 pg (ref 25.9–32.4)
MEAN CORPUSCULAR VOLUME: 85.8 fL (ref 77.6–95.7)
MEAN PLATELET VOLUME: 8.4 fL (ref 6.8–10.7)
MONOCYTES ABSOLUTE COUNT: 0.4 10*9/L (ref 0.3–0.8)
MONOCYTES RELATIVE PERCENT: 7.8 %
NEUTROPHILS ABSOLUTE COUNT: 3.6 10*9/L (ref 1.8–7.8)
NEUTROPHILS RELATIVE PERCENT: 65.2 %
PLATELET COUNT: 181 10*9/L (ref 150–450)
RED BLOOD CELL COUNT: 4.54 10*12/L (ref 4.26–5.60)
RED CELL DISTRIBUTION WIDTH: 15.2 % (ref 12.2–15.2)
WBC ADJUSTED: 5.5 10*9/L (ref 3.6–11.2)

## 2024-08-27 LAB — COMPREHENSIVE METABOLIC PANEL
ALBUMIN: 3.2 g/dL — ABNORMAL LOW (ref 3.4–5.0)
ALKALINE PHOSPHATASE: 152 U/L — ABNORMAL HIGH (ref 46–116)
ALT (SGPT): 21 U/L (ref 10–49)
ANION GAP: 10 mmol/L (ref 5–14)
AST (SGOT): 18 U/L (ref <=34)
BILIRUBIN TOTAL: 0.6 mg/dL (ref 0.3–1.2)
BLOOD UREA NITROGEN: 9 mg/dL (ref 9–23)
BUN / CREAT RATIO: 8
CALCIUM: 8.5 mg/dL — ABNORMAL LOW (ref 8.7–10.4)
CHLORIDE: 111 mmol/L — ABNORMAL HIGH (ref 98–107)
CO2: 23 mmol/L (ref 20.0–31.0)
CREATININE: 1.08 mg/dL (ref 0.73–1.18)
EGFR CKD-EPI (2021) MALE: 85 mL/min/{1.73_m2} (ref >=60)
GLUCOSE RANDOM: 88 mg/dL (ref 70–179)
POTASSIUM: 3.2 mmol/L — ABNORMAL LOW (ref 3.4–4.8)
PROTEIN TOTAL: 6.2 g/dL (ref 5.7–8.2)
SODIUM: 144 mmol/L (ref 135–145)

## 2024-08-27 LAB — URINALYSIS WITH MICROSCOPY WITH CULTURE REFLEX PERFORMABLE
BACTERIA: NONE SEEN /HPF
BILIRUBIN UA: NEGATIVE
BLOOD UA: NEGATIVE
GLUCOSE UA: NEGATIVE
KETONES UA: NEGATIVE
LEUKOCYTE ESTERASE UA: NEGATIVE
NITRITE UA: NEGATIVE
PH UA: 6.5 (ref 5.0–9.0)
PROTEIN UA: 30 — AB
RBC UA: 2 /HPF (ref <=3)
SPECIFIC GRAVITY UA: 1.087 — ABNORMAL HIGH (ref 1.003–1.030)
SQUAMOUS EPITHELIAL: 4 /HPF (ref 0–5)
UROBILINOGEN UA: <2
WBC UA: 3 /HPF — ABNORMAL HIGH (ref <=2)

## 2024-08-27 LAB — LIPASE: LIPASE: 53 U/L (ref 12–53)

## 2024-08-27 LAB — C-REACTIVE PROTEIN: C-REACTIVE PROTEIN: 17.6 mg/L — ABNORMAL HIGH (ref <=10.0)

## 2024-08-27 LAB — MAGNESIUM: MAGNESIUM: 1.5 mg/dL — ABNORMAL LOW (ref 1.6–2.6)

## 2024-08-27 LAB — PHOSPHORUS: PHOSPHORUS: 2.3 mg/dL — ABNORMAL LOW (ref 2.4–5.1)

## 2024-08-27 LAB — SEDIMENTATION RATE: ERYTHROCYTE SEDIMENTATION RATE: 4 mm/h (ref 0–15)

## 2024-08-27 MED ADMIN — oxyCODONE (ROXICODONE) immediate release tablet 10 mg: 10 mg | ORAL | @ 21:00:00 | Stop: 2024-09-03

## 2024-08-27 MED ADMIN — fluticasone furoate-vilanterol (BREO ELLIPTA) 100-25 mcg/dose inhaler 1 puff: 1 | RESPIRATORY_TRACT | @ 23:00:00

## 2024-08-27 MED ADMIN — methylPREDNISolone sodium succinate (SOLU-Medrol) injection 125 mg: 125 mg | INTRAVENOUS | @ 23:00:00 | Stop: 2024-08-27

## 2024-08-27 MED ADMIN — enoxaparin (LOVENOX) syringe 40 mg: 40 mg | SUBCUTANEOUS | @ 23:00:00

## 2024-08-27 MED ADMIN — iohexol (OMNIPAQUE) 350 mg iodine/mL solution 100 mL: 100 mL | INTRAVENOUS | @ 17:00:00 | Stop: 2024-08-27

## 2024-08-27 MED ADMIN — HYDROmorphone (PF) (DILAUDID) injection 0.5 mg: .5 mg | INTRAVENOUS | @ 16:00:00 | Stop: 2024-08-27

## 2024-08-27 MED ADMIN — prochlorperazine (COMPAZINE) tablet 5 mg: 5 mg | ORAL | @ 16:00:00 | Stop: 2024-08-27

## 2024-08-27 MED ADMIN — ferrous sulfate tablet 325 mg: 325 mg | ORAL | @ 23:00:00

## 2024-08-27 MED ADMIN — hydrOXYzine (ATARAX) tablet 25 mg: 25 mg | ORAL | @ 16:00:00 | Stop: 2024-08-27

## 2024-08-27 MED ADMIN — HYDROmorphone (PF) (DILAUDID) injection 0.5 mg: .5 mg | INTRAVENOUS | @ 18:00:00 | Stop: 2024-08-27

## 2024-08-27 MED ADMIN — sodium chloride 0.9% (NS) bolus 1,000 mL: 1000 mL | INTRAVENOUS | @ 16:00:00 | Stop: 2024-08-27

## 2024-08-27 NOTE — ED Triage Note (Signed)
 Pt coming from home with abdominal pain. Endorsing N/V. History of chron's

## 2024-08-27 NOTE — Consults (Signed)
 [facility]   Initial Consultation         Assessment and Recommendations:   Jerome Fralix. is a 49 y.o. 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 The patient is seen in consultation at the request of Comer Almetta Hammersmith, MD ([facility])) for Crohn's disease.    C/f CD flare  H/o stricturing ileocolonic CD  57M p/w sx of abd pain, nausea, recent hospitalization for CD flare on pred taper until ~1 wk ago. Denies diarrhea.CRP much improved from previous admission. CTAP with mild inflammationn of neoterminal ileum and rectum. At this time favor mild CD flare, patient s/p 125 IV solumedrol in ED. Assess response to this and will make further decisions pending clinical course.  - recommend CLD  - monitor bowel habits and symptoms closely  - continue DVT ppx  - daily CBC, CMP, CRP  - obtain iron  panel/ferritin  - hold on further steroids at this time  - await stool studies    Issues Impacting Complexity of Management:  -None    Recommendations discussed with the patient's primary team. We will continue to follow along with you.    Subjective:     Reports developing symptoms a few days ago. Was told to stop prednisone  at [facility] a week ago. Previously was on pred taper provided last admission. Today patient says he was hungry but the sandwich in the ED had meat that he wouldn't eat.    Objective:   Temp:  [36.4 ??C (97.5 ??F)-36.8 ??C (98.2 ??F)] 36.8 ??C (98.2 ??F)  Pulse:  [80-107] 82  Resp:  [18] 18  BP: (94-114)/(73-79) 114/78  SpO2:  [98 %-100 %] 99 %    Gen: WDWN male in NAD, answers questions appropriately  Abdomen: Soft, TTP at hernia, mid abdomen  Extremities: No edema in the BLEs    Pertinent Labs & Studies:  -I have reviewed the patient's labs from 08/27/24 which show stable Hgb and stable renal function (SCr, electrolytes)

## 2024-08-27 NOTE — H&P (Signed)
 [facility]   History and Physical       Assessment and Plan     Jerome Kennedy. is a 49 y.o. male with a PMH of iron  deficiency anemia, bipolar 1 disorder, COPD, history of DVT, tobacco use disorder who is presenting to [facility] with exacerbation of Crohn's disease.     Exacerbation of Crohn's disease    (CMS-HCC)   Complicated by abdominal pain, nausea and vomiting   Non-bloody stools, diarrhea, and immunocompromised due to drug therapy   Chronic illness with severe exacerbation or progression that has a significant risk of morbidity without appropriate treatment  CT scan with contrast shows active inflammation involving the neoterminal ileum and rectum   Completed steroid course last week with taper   Plan  - NPO, advance as tolerated   - Discuss with GI for steroids and opioid sparing regiment    - GI stated no steroids for now, received 125 mg solumedrol in ED   - GIPP, c. Diff, stool calprotectin  - ESR/CRP   - Symptomatic pain regiment/multimodal, compazine    - Serial abdominal exam   - Social work consultation   - Multimodal pain control: 1000 tylenol  q8, lidocaine  patch, gabapentin    - Social work Engineer, [employer] Active Problems:  Electrolyte abnormalities (hypomagnesemia, hypokalemia, and hypophosphatemia)   - Replete K, Mg, and Ph   - Check Mg and Ph     Iron  deficiency anemia   - Not anemic at this time   - Iron  supplementation as needed     Bipolar 1 disorder   - Continue seroquel  PO HS     COPD not in exacerbation   - Duonebs as needed     Prophylaxis  -Lovenox     Diet  -No diet orders on file    Code Status / HCDM  -Full Code, Discussed with patient at the time of admission   -  HCDM (patient stated preference): Jerome Kennedy, Jerome Kennedy - Brother - [phone]    Anticipated Medically Ready for Discharge: Anticipated Tomorrow    Significant Comorbid Conditions:      Issues Impacting Complexity of Management:              HPI      Jerome Kennedy. is a 49 y.o. male who is presenting to [facility] with Exacerbation of Crohn's disease    (CMS-HCC).    Patient is known to service with multiple abdominal surgeries and GI hernia present. Many flares and recent admission and discharge 12/26 with long prednisone  taper. Currently having 3+ watery non-bloody stools daily with nausea and vomiting. No fevers. Severe right lower quadrant pain and no radiation. CT with active crohn's disease in ileum and rectum. Completed long steroid taper last week.     ER course was notable for hemodynamically stable. Patient did have tachycardia. EKG showed QTC of 450. CMP showed hypokalemia and alk phos 152. Lupase was wnl. Mg 1.5. Ph of 2.3. CRP 17.6. CBC wnl. Patient was given hydroxyzine , compazine , 1 L NS, and hydromorphone  1 mg total. CT a/p w/ contrast showed similar findings of active inflammation involving the neoterminal ileum and rectum. Similar mild dilation of the upstream small bowel in the left upper quadrant.       Med Rec Confidence   I reviewed the Medication List. The current list is Accurate    Physical Exam   Temp:  [36.6 ??C (97.8 ??F)] 36.6 ??C (97.8 ??F)  Pulse:  [93-107] 93  Resp:  [18] 18  BP: (107)/(79) 107/79  SpO2:  [98 %-100 %] 100 %  Body mass index is 22.15 kg/m??.  GEN: Appears older than stated age, mild distress - in obvious pain  HEENT: Dry MM and lips, poor dentition, recent emesis, white sclera, normal hearing  PULM: No respiratory distress, on room air, no wheezing on exam  CARDS: Normal rate, well perfused/warm, no LEE  ABD: Abdominal hernia present in RLQ, pain in RLQ, abdomen soft, non-tender   SKIN: Warm, dry, clothed exam  NEURO: Nonfocal, A&O  PSYCH: Appropriate, calm, normal speech

## 2024-08-27 NOTE — ED Triage Note (Signed)
 Pt arrives c/o 8/10 abdominal pain that began last night. Pt also endorsing some N/v/d. Pt has hx of chrons. Pt also endorsing sob and cough.

## 2024-08-27 NOTE — ED Provider Notes (Signed)
 [facility]  Emergency Department Provider Note     ED Clinical Impression     Final diagnoses:   Right lower quadrant abdominal pain (Primary)   Diarrhea, unspecified type      Impression, Medical Decision Making, ED Course     DDX: The differential for this complaint includes but is not limited to Crohn's disease, appendicitis, diverticulitis, psoas abscess, pancreatitis, leukocytoclastic vasculitis.     MDM: 49 y.o. male with PMH most significant for DVT, COPD, Crohn's disease, SBO, depression, and anxiety who presents with severe abdominal pain which onset last night with associated nausea, emesis, and diarrhea as described below.     History and presentations consistent with prior Crohn's flares.  Was recently discharged on 12/26 and has been off of prednisone  for a week.  Now with acute worsening abdominal pain with recent diarrhea.  No bloody diarrhea.  Has stricturing ileocolonic disease, appropriately explaining the right lower quadrant pain.  Due to the recent admission we will try to defer CT abdominal imaging at this time due to higher suspicion for Crohn's flare given recent cessation of steroids and no other immunomodulators.  Otherwise we will start with pain control, nausea meds, fluids.  CBC CMP lipase and urine otherwise.  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  Will likely require admission.    Initial vitals reviewed as within normal limits.    ED Course as of 08/27/24 1612   Sat Aug 27, 2024   1421 Sent out page to [facility] for CT with evidence of Crohn's flare requiring multiple pain medications.   1513 CT abdomen pelvis demonstrating similar findings to previous flare to include inflammatory ileocolic and some dilations consistent with prior.  Patient out to GI for initial anti-inflammatory protocol, otherwise pending admission.   1611 Patient except by admitting team, will start Solu-Medrol  125 for Crohn's flare, awaiting further recommendations from GI in the meantime.       Disposition: Admission    MDM Elements  Discussion of Management with other Physicians, QHP or Appropriate Source: Consultant - GI    I have reviewed recent and relevant previous record, including: Outpatient notes - (01/14/24) GI office visit. Reviewed past medical history.          ____________________________________________    The case was discussed with the attending physician, who is in agreement with the above assessment and plan.      History     Chief Complaint  Chief Complaint   Patient presents with    Abdominal Pain       HPI   Daymian Lill. is a 49 y.o. male with past medical history of DVT, COPD, stricturing ileocolonic Crohn's disease, SBO, bipolar disorder, depression, and anxiety who presents with abdominal pain. The patient reports severe abdominal pain which onset last night with associated nausea, emesis, and diarrhea. He notes his current pain feels similar to his past Crohn's flare ups, and he last had a flare up in December. He states he is not sure what could have brought on his current Crohn's flare up. He additionally endorses bilateral arm rashes. He has not been able to tolerate PO intake of solids or fluids since last night. He additionally notes shortness of breath and a cough. His last abdominal surgery was in 2021. He had humira  injection around 6 months ago. He was on prednisone  and finished his  course last week.  He has a history of asthma and last used his inhaler this morning. He denies any fever, chills, chest pain, eye pain, back pain, or hematochezia.    Outside Historian(s): None.    Past Medical History[1]    Past Surgical History[2]      Current Facility-Administered Medications:     acetaminophen  (TYLENOL ) tablet 1,000 mg, 1,000 mg, Oral, Q4H PRN, Hossain, Shakil, DO    enoxaparin  (LOVENOX ) syringe 40 mg, 40 mg, Subcutaneous, Q24H, Hossain, Shakil, DO    ferrous sulfate  tablet 325 mg, 325 mg, Oral, Daily, Hossain, Shakil, DO    fluticasone  furoate-vilanterol (BREO ELLIPTA ) 100-25 mcg/dose inhaler 1 puff, 1 puff, Inhalation, Daily (RT), Hossain, Shakil, DO    HYDROmorphone  (PF) (DILAUDID ) injection 0.25 mg, 0.25 mg, Intravenous, Q4H PRN, Hossain, Shakil, DO    magnesium  sulfate 2gm/50mL IVPB, 2 g, Intravenous, Once, Hossain, Shakil, DO    methylPREDNISolone  sodium succinate  (SOLU-Medrol ) injection 125 mg, 125 mg, Intravenous, Once, Salia Cangemi A, MD    oxyCODONE  (ROXICODONE ) immediate release tablet 10 mg, 10 mg, Oral, Q4H PRN, Hossain, Shakil, DO, 10 mg at 08/27/24 1553    [START ON 08/28/2024] pantoprazole  (Protonix ) EC tablet 40 mg, 40 mg, Oral, Daily before breakfast, Hossain, Shakil, DO    potassium phosphate  30 mmol in sodium chloride  (NS) 0.9 % 500 mL infusion, 30 mmol, Intravenous, Once, Hossain, Shakil, DO    QUEtiapine  (SEROQUEL ) tablet 100 mg, 100 mg, Oral, Nightly, Hossain, Shakil, DO    Current Outpatient Medications:     acetaminophen  (TYLENOL ) 500 MG tablet, Take 2 tablets (1,000 mg total) by mouth every eight (8) hours., Disp: 30 tablet, Rfl: 0    empty container Misc, Use as directed, Disp: 1 each, Rfl: 1    ferrous sulfate  325 (65 FE) MG tablet, Take 1 tablet (325 mg total) by mouth in the morning., Disp: , Rfl:     FLONASE  ALLERGY RELIEF 50 mcg/actuation nasal spray, 2 sprays into each nostril daily as needed., Disp: , Rfl:     guselkumab  (TREMFYA  PEN) 200 mg/2 mL PnIj, Inject the contents of 1 pen (200 mg) under the skin every twenty-eight (28) days., Disp: 2 mL, Rfl: 5    hydrocortisone  2.5 % cream, Apply topically two (2) times a day., Disp: 30 g, Rfl: 0    nicotine  (NICODERM CQ ) 14 mg/24 hr patch, Place 1 patch on the skin daily., Disp: 28 patch, Rfl: 1    OPTICHAMBER DIAMOND VHC Spcr, , Disp: , Rfl:     pantoprazole  (PROTONIX ) 40 MG tablet, Take 1 tablet (40 mg total) by mouth daily before breakfast., Disp: , Rfl: predniSONE  (DELTASONE ) 10 MG tablet, Take 4 tablets (40 mg total) by mouth daily for 13 days, THEN 3 tablets (30 mg total) daily for 7 days, THEN 2 tablets (20 mg total) daily for 7 days, THEN 1 tablet (10 mg total) daily for 7 days., Disp: 94 tablet, Rfl: 0    QUEtiapine  (SEROQUEL ) 100 MG tablet, Take 1 tablet (100 mg total) by mouth nightly., Disp: , Rfl:     SYMBICORT  80-4.5 mcg/actuation inhaler, Inhale 2 puffs  in the morning., Disp: 10.2 g, Rfl: 0    VENTOLIN  HFA 90 mcg/actuation inhaler, Inhale 2 puffs every six (6) hours as needed., Disp: 18 g, Rfl: 0    Allergies  Morphine  and Toradol [ketorolac]    Family History  Family History[3]    Social History  Short Social History[4]     Physical Exam  VITAL SIGNS:      Vitals:    08/27/24 1016 08/27/24 1022   BP:  107/79   Pulse: 107 93   Resp:  18   Temp:  36.6 ??C (97.8 ??F)   TempSrc:  Oral   SpO2: 98% 100%   Weight:  68 kg (150 lb)       Constitutional: Alert and oriented. Appears uncomfortable  Eyes: Conjunctivae are normal.  HEENT: Normocephalic and atraumatic. Conjunctivae clear. No congestion. Moist mucous membranes.   Cardiovascular: Rate as above, regular rhythm. Normal and symmetric distal pulses. Brisk capillary refill. Normal skin turgor.  Respiratory: Normal respiratory effort. Breath sounds are normal. There are no wheezing or crackles heard.  Gastrointestinal: Soft, multiple incisional hernias present without overlying erythema,tenderness or induration. Severe tenderness to light palpation in RLQ. No rebound.  Genitourinary: Deferred.  Musculoskeletal: Non-tender with normal range of motion in all extremities.  Neurologic: Normal speech and language. No gross focal neurologic deficits are appreciated. Patient is moving all extremities equally, face is symmetric at rest and with speech.  Skin: Skin is warm, dry and intact.  Has bilateral upper extremity purpuric/macular rash that patient states is itchy and burns.  Psychiatric: Mood and affect are normal. Speech and behavior are normal.     Radiology     CT Abdomen Pelvis W IV Contrast   Final Result   Similar findings of active inflammation involving the neoterminal ileum and rectum. Similar mild dilation of the upstream small bowel in the left upper quadrant.             Pertinent labs & imaging results that were available during my care of the patient were independently interpreted by me and considered in my medical decision making (see chart for details).    Portions of this record have been created using Scientist, clinical (histocompatibility and immunogenetics). Dictation errors have been sought, but may not have been identified and corrected.        Documentation assistance was provided by Ike Fisherman, Scribe on August 27, 2024 at 10:54 AM for Bernardino Salter, MD.       Documentation assistance was provided by the scribe in my presence.  The documentation recorded by the scribe has been reviewed by me and accurately reflects the services I personally performed.             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    Myalgia     Rhinovirus infection 10/29/2022    SBO (small bowel obstruction) (CMS-HCC) 2011   [2]   Past Surgical History:  Procedure Laterality Date    CHOLECYSTECTOMY      COLON SURGERY      partial resection    HERNIA REPAIR      PR ARTHRP INTERCARPAL/CARP/MTCRPL JT INTERPOSITION Left 08/07/2022    Procedure: VELLA ARTHROPLASTY-INTERCARPAL/CARPOMETACAR;  Surgeon: Bonifacio Cordella HERO, MD;  Location: MAIN OR [facility];  Service: Orthopedics  PR CLOSE ENTEROSTOMY,RESEC+COLOREC ANAS Midline 11/01/2019    Procedure: CLO ENTEROSTOMY; W/RESECT COLORECTAL ANASTOM;  Surgeon: Aloysius Euell Quale, MD;  Location: MAIN OR [place];  Service: Gastrointestinal    PR COLONOSCOPY FLX DX W/COLLJ SPEC WHEN PFRMD Left 01/07/2013    Procedure: COLONOSCOPY, FLEXIBLE, PROXIMAL TO SPLENIC FLEXURE; DIAGNOSTIC, W/WO COLLECTION SPECIMEN BY BRUSH OR WASH;  Surgeon: Liliane JENEANE Marker, MD;  Location: [facility]URES [facility];  Service: Gastroenterology    PR COLONOSCOPY FLX DX W/COLLJ SPEC WHEN PFRMD  03/09/2015    Procedure: COLONOSCOPY, FLEXIBLE, PROXIMAL TO SPLENIC FLEXURE; DIAGNOSTIC, W/WO COLLECTION SPECIMEN BY BRUSH OR WASH;  Surgeon: Artist Jayson Leather, MD;  Location: GI PROCEDURES [facility];  Service: Gastroenterology    PR COLONOSCOPY FLX DX W/COLLJ SPEC WHEN PFRMD N/A 10/28/2019    Procedure: COLONOSCOPY, FLEXIBLE, PROXIMAL TO SPLENIC FLEXURE; DIAGNOSTIC, W/WO COLLECTION SPECIMEN BY BRUSH OR WASH;  Surgeon: Derick Bring, MD;  Location: GI PROCEDURES [facility];  Service: Gastroenterology    PR COLONOSCOPY FLX DX W/COLLJ SPEC WHEN PFRMD N/A 10/01/2022    Procedure: COLONOSCOPY, FLEXIBLE, PROXIMAL TO SPLENIC FLEXURE; DIAGNOSTIC, W/WO COLLECTION SPECIMEN BY BRUSH OR WASH;  Surgeon: Darrick Peck, MD;  Location: GI PROCEDURES [facility];  Service: Gastroenterology    PR COLONOSCOPY FLX DX W/COLLJ SPEC WHEN PFRMD N/A 11/30/2023    Procedure: COLONOSCOPY, FLEXIBLE, PROXIMAL TO SPLENIC FLEXURE; DIAGNOSTIC, W/WO COLLECTION SPECIMEN BY BRUSH OR WASH;  Surgeon: Audrey Lacinda Hun, MD;  Location: GI PROCEDURES [facility];  Service: Gastroenterology    PR COLONOSCOPY W/BIOPSY SINGLE/MULTIPLE N/A 04/08/2024    Procedure: COLONOSCOPY, FLEXIBLE, PROXIMAL TO SPLENIC FLEXURE; WITH BIOPSY, SINGLE OR MULTIPLE;  Surgeon: Celestina Comer Norris, MD;  Location: GI PROC[facility]URES [facility];  Service: Gastroenterology    PR EXPLORATORY OF ABDOMEN Midline 09/02/2019    Procedure: EXPLORATORY LAPAROTOMY, EXPLORATORY CELIOTOMY WITH OR WITHOUT BIOPSY(S);  Surgeon: Aloysius Euell Quale, MD;  Location: MAIN OR [facility];  Service: Gastrointestinal    PR EXPLORATORY OF ABDOMEN Midline 11/01/2019    Procedure: EXPLORATORY LAPAROTOMY, EXPLORATORY CELIOTOMY WITH OR WITHOUT BIOPSY(S);  Surgeon: Aloysius Euell Quale, MD;  Location: MAIN OR [facility];  Service: Gastrointestinal    PR FIX FINGER,VOLAR PLATE,I-P JT Left 08/07/2022    Procedure: REPAIR AND RECONSTRUCTION, FINGER, VOLAR PLATE, INTERPHALANGEAL JOINT;  Surgeon: Bonifacio Cordella HERO, MD;  Location: MAIN OR [facility];  Service: Orthopedics    PR FREEING BOWEL ADHESION,ENTEROLYSIS N/A 11/01/2019    Procedure: Enterolysis (Separt Proc);  Surgeon: Aloysius Euell Quale, MD;  Location: MAIN OR [facility];  Service: Gastrointestinal    PR FUSION MC-P JT Left 08/27/2023    Procedure: ARTHRODESIS, METACARPOPHALANGEAL JOINT, WITH OR WITHOUT INTERNAL FIXATION;  Surgeon: Bonifacio Cordella HERO, MD;  [facility]: [facility];  Service: Orthopedics    PR ILEOSCOPY THRU STOMA,BIOPSY N/A 10/14/2019    Procedure: LYNETT; LORN 1/MX;  Surgeon: Thedora Alm Plain, MD;  Location: GI PROC[facility]URES [facility];  Service: Gastroenterology    PR PART REMOVAL COLON W COLOSTOMY Midline 09/02/2019    Procedure: COLECTOMY, PARTIAL; WITH SKIN LEVEL CECOSTOMY OR COLOSTOMY;  Surgeon: Aloysius Euell Quale, MD;  Location: MAIN OR [facility];  Service: Gastrointestinal    PR REPAIR INCISIONAL HERNIA,REDUCIBLE Midline 09/02/2019    Procedure: REPAIR INIT INCISIONAL OR VENTRAL HERNIA; REDUCIBLE;  Surgeon: Arnette Darice Burnet, MD;  Location: MAIN OR [place];  Service: Trauma    PR UPPER GI ENDOSCOPY,BIOPSY N/A 01/07/2013    Procedure: UGI ENDOSCOPY; WITH BIOPSY, SINGLE OR MULTIPLE;  Surgeon: Liliane JENEANE Marker, MD;  Location: GI PROCEDURES [facility];  Service: Gastroenterology    PR UPPER GI ENDOSCOPY,BIOPSY N/A 10/14/2019    Procedure: UGI ENDOSCOPY; WITH BIOPSY, SINGLE OR MULTIPLE;  Surgeon: Thedora Alm Plain, MD;  Location: GI PROCEDURES [facility];  Service: Gastroenterology    PR UPPER GI ENDOSCOPY,DIAGNOSIS N/A 10/01/2022    Procedure: UGI ENDO, INCLUDE ESOPHAGUS, STOMACH, & DUODENUM &/OR JEJUNUM; DX W/WO COLLECTION SPECIMN, BY BRUSH OR WASH;  Surgeon: Darrick Peck, MD;  Location: GI [facility]URES [facility];  Service: Gastroenterology   [3]   Family History  Problem Relation Age of Onset    Breast cancer Mother     Diabetes Maternal Aunt     Colon cancer Maternal Grandmother     Prostate cancer Maternal Grandfather     Cancer Paternal Grandmother     Crohn's disease Neg Hx    [4]   Social History  Tobacco Use    Smoking status: Every Day     Current packs/day: 0.50     Average packs/day: 0.5 packs/day for 22.9 years (11.5 ttl pk-yrs)     Types: Cigarettes     Start date: 10/02/2001     Passive exposure: Current    Smokeless tobacco: Never    Tobacco comments:     10cpd   Vaping Use    Vaping status: Some Days    Substances: Nicotine     Passive vaping exposure: Yes   Substance Use Topics    Alcohol use: No     Alcohol/week: 0.0 standard drinks of alcohol    Drug use: Yes     Types: Marijuana     Comment: Smokes 2-3 joints/day        Correne Bernardino LABOR, MD  Resident  08/27/24 [phone]

## 2024-08-28 LAB — IRON PANEL
IRON SATURATION: 16 % — ABNORMAL LOW (ref 20–55)
IRON: 47 ug/dL — ABNORMAL LOW (ref 65–175)
TOTAL IRON BINDING CAPACITY: 294 ug/dL (ref 250–425)

## 2024-08-28 LAB — COMPREHENSIVE METABOLIC PANEL
ALBUMIN: 3.1 g/dL — ABNORMAL LOW (ref 3.4–5.0)
ALKALINE PHOSPHATASE: 157 U/L — ABNORMAL HIGH (ref 46–116)
ALT (SGPT): 24 U/L (ref 10–49)
ANION GAP: 16 mmol/L — ABNORMAL HIGH (ref 5–14)
AST (SGOT): 24 U/L (ref <=34)
BILIRUBIN TOTAL: 0.6 mg/dL (ref 0.3–1.2)
BLOOD UREA NITROGEN: 9 mg/dL (ref 9–23)
BUN / CREAT RATIO: 9
CALCIUM: 7.9 mg/dL — ABNORMAL LOW (ref 8.7–10.4)
CHLORIDE: 111 mmol/L — ABNORMAL HIGH (ref 98–107)
CO2: 15 mmol/L — ABNORMAL LOW (ref 20.0–31.0)
CREATININE: 1.02 mg/dL (ref 0.73–1.18)
EGFR CKD-EPI (2021) MALE: >90 mL/min/{1.73_m2} (ref >=60)
GLUCOSE RANDOM: 115 mg/dL (ref 70–179)
POTASSIUM: 4.2 mmol/L (ref 3.5–5.1)
PROTEIN TOTAL: 6.2 g/dL (ref 5.7–8.2)
SODIUM: 142 mmol/L (ref 135–145)

## 2024-08-28 LAB — CBC
HEMATOCRIT: 40.2 % (ref 39.0–48.0)
HEMOGLOBIN: 12.9 g/dL (ref 12.9–16.5)
MEAN CORPUSCULAR HEMOGLOBIN CONC: 32 g/dL (ref 32.0–36.0)
MEAN CORPUSCULAR HEMOGLOBIN: 28.3 pg (ref 25.9–32.4)
MEAN CORPUSCULAR VOLUME: 88.5 fL (ref 77.6–95.7)
PLATELET COUNT: >154 10*9/L (ref 150–450)
RED BLOOD CELL COUNT: 4.54 10*12/L (ref 4.26–5.60)
RED CELL DISTRIBUTION WIDTH: 15.4 % — ABNORMAL HIGH (ref 12.2–15.2)
WBC ADJUSTED: 6.1 10*9/L (ref 3.6–11.2)

## 2024-08-28 LAB — FERRITIN: FERRITIN: 144.5 ng/mL (ref 30.0–307.3)

## 2024-08-28 LAB — MAGNESIUM: MAGNESIUM: 1.3 mg/dL — ABNORMAL LOW (ref 1.6–2.6)

## 2024-08-28 LAB — PHOSPHORUS: PHOSPHORUS: 3.2 mg/dL (ref 2.4–5.1)

## 2024-08-28 MED ADMIN — acetaminophen (TYLENOL) tablet 1,000 mg: 1000 mg | ORAL | @ 16:00:00

## 2024-08-28 MED ADMIN — oxyCODONE (ROXICODONE) immediate release tablet 10 mg: 10 mg | ORAL | Stop: 2024-09-03

## 2024-08-28 MED ADMIN — oxyCODONE (ROXICODONE) immediate release tablet 10 mg: 10 mg | ORAL | @ 10:00:00 | Stop: 2024-09-03

## 2024-08-28 MED ADMIN — oxyCODONE (ROXICODONE) immediate release tablet 10 mg: 10 mg | ORAL | @ 01:00:00 | Stop: 2024-09-03

## 2024-08-28 MED ADMIN — fluticasone furoate-vilanterol (BREO ELLIPTA) 100-25 mcg/dose inhaler 1 puff: 1 | RESPIRATORY_TRACT | @ 19:00:00

## 2024-08-28 MED ADMIN — nicotine (NICODERM CQ) 21 mg/24 hr patch 1 patch: 1 | TRANSDERMAL | @ 16:00:00

## 2024-08-28 MED ADMIN — enoxaparin (LOVENOX) syringe 40 mg: 40 mg | SUBCUTANEOUS | @ 22:00:00

## 2024-08-28 MED ADMIN — QUEtiapine (SEROQUEL) tablet 100 mg: 100 mg | ORAL | @ 01:00:00

## 2024-08-28 MED ADMIN — ferrous sulfate tablet 325 mg: 325 mg | ORAL | @ 13:00:00

## 2024-08-28 MED ADMIN — pantoprazole (Protonix) EC tablet 40 mg: 40 mg | ORAL | @ 13:00:00

## 2024-08-28 NOTE — Progress Notes (Signed)
 [facility]   Progress Note         Assessment and Recommendations:   Jerome Kennedy. is a 49 y.o. 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 The patient is seen in consultation at the request of Johanne Gong, FNP ([facility] for Crohn's disease.    C/f mild CD flare  H/o stricturing ileocolonic CD  Today patient with completely resolved symptoms, denies any diarrhea, constipation, nausea, vomiting, abdominal pain. Tolerating regular diet. There was no repeat CRP today, though expect it had normalized. At this time recommend starting PO steroid while we work on better transition to outpatient follow up.  - please give prednisone  40mg  x1 1/25 PM  - OK for diet as tolerated  - monitor bowel habits and symptoms closely  - continue DVT ppx  - daily CBC, CMP, CRP    Issues Impacting Complexity of Management:  -None    Recommendations discussed with the patient's primary team. We will continue to follow along with you.    Subjective:     Resolved symptoms this afternoon, tolerating regular diet with no complaints.     Objective:   Temp:  [36.4 ??C (97.5 ??F)-36.7 ??C (98.1 ??F)] 36.7 ??C (98.1 ??F)  Pulse:  [70-78] 78  Resp:  [16-18] 18  BP: (89-117)/(54-81) 102/74  SpO2:  [97 %-100 %] 100 %    Gen: WDWN male in NAD, answers questions appropriately  Abdomen: Soft, TTP at hernia, mid abdomen  Extremities: No edema in the BLEs    Pertinent Labs & Studies:  -I have reviewed the patient's labs from 08/28/24 which show stable Hgb and stable renal function (SCr, electrolytes)

## 2024-08-28 NOTE — Plan of Care (Signed)
 VSS, afebrile, no falls noted during shift. Pt c/o 4/10 abdominal pain, thus PRN tylenol  was administered as ordered with relieving affects upon reassessment. Pt educated to call for assistance, thus call bell within reach. Pt is resting comfortably in bed, no complaints at this time.     Shift Summary  Acetaminophen  was administered for increased pain, resulting in pain reduction to 0/10 later in the shift.  Frequent repositioning, absorbent pad changes, and incontinence interventions were maintained to protect skin integrity.  Fall reduction and safety interventions, including use of safety devices during transfers, were consistently implemented.  Enteric isolation precautions were maintained, and temperature remained stable throughout the shift.  Overall, comfort and safety interventions were maintained, and pain was effectively managed during the shift.    Absence of Hospital-Acquired Illness or Injury: Frequent repositioning, use of absorbent pads, and limited adhesive use were maintained throughout the shift, and fall reduction interventions and safety devices were consistently applied during transfers.    Optimal Comfort and Wellbeing: Comfort measures included regular repositioning and pain management, with pain reduced to 0 after acetaminophen  administration; incontinence and skin protection interventions were also maintained.    Readiness for Transition of Care: Observation status was maintained, and the patient was able to transport by wheelchair independently throughout the shift.    Absence of Infection Signs and Symptoms: Temperature remained stable and within normal limits, and enteric isolation precautions were consistently maintained throughout the shift.    Optimal Pain Control and Function: Pain increased to 4 mid-shift but returned to 0 after acetaminophen  was given, with pain described as aching and constant prior to intervention.

## 2024-08-28 NOTE — Progress Notes (Addendum)
 Daily Progress Note    Assessment/Plan:    Principal Problem:    Exacerbation of Crohn's disease    (CMS-HCC)                 Jerome Kennedy. is a 49 y.o. male with a PMH of iron  deficiency anemia, bipolar 1 disorder, COPD, history of DVT, tobacco use disorder who is presenting to [facility] with exacerbation of Crohn's disease.      Exacerbation of Crohn's disease    (CMS-HCC)   Complicated by abdominal pain, nausea and vomiting   Non-bloody stools, diarrhea, and immunocompromised due to drug therapy   Chronic illness with severe exacerbation or progression that has a significant risk of morbidity without appropriate treatment  Presented with abdominal pain, diarrhea, nausea and vomiting.  Initial labs reassuring without leukocytosis.  Vital signs have been stable. CT showed active inflammation involving the neoterminal ileum and rectum. Pt had  completed steroid taper a week ago.  GI consulted, agree symptoms consistent with mild CAD flare.  Status post 1 dose IV Solu-Medrol  125 mg in ED.  Recommend continuing clear liquid diet and holding off on further steroids at this time.    - Appreciate GI assist-following  - Patient became very upset and declined clear liquid diet and insisted to be on regular diet-currently on regular diet  - Hold off on further steroids at this time  - Pending stool studies for C. difficile, GIPP, stool calprotectin-no sample yet  - Pain control with scheduled Tylenol , as needed oxycodone  and Dilaudid     - Nausea control with Zofran  as needed  - Serial abdominal exam  - Follow-up CBC, CMP in a.m.      Secondary/Additional Active Problems:  Electrolyte abnormalities (hypomagnesemia, hypokalemia, and hypophosphatemia)   Repleted electrolytes based on initial labs.  Repeat labs with stable potassium.  - Will follow-up electrolytes and replete as needed   - Added magnesium  and phosphorus to morning labs  - Follow-up BMP, mg, Phos in a.m.  - Encourage p.o. intake-given p.o. tolerance    Tobacco use disorder: Counseling provided to quit smoking.  Patient is agreeable to try NRT.  -Ordered nicotine  patch 21 mg daily  -Ordered nicotine  lozenges/gum 4 mg every hour as needed     Iron  deficiency anemia : Not anemic at this time .  Asymptomatic.  Iron  panel consistent with iron  deficiency anemia but improving from prior labs in July 2025.   - Consider IV iron  infusion once acute process resolved  - Iron  supplementation as needed      Bipolar 1 disorder   - Continue seroquel  PO HS      COPD not in exacerbation   - Duonebs as needed      Prophylaxis  -Lovenox      Diet  - Regular diet     Code Status / HCDM  -Full Code, Discussed with patient at the time of admission   -  HCDM (patient stated preference): [place], [place] - Brother - [phone]    I personally spent 45 minutes face-to-face and non-face-to-face in the care of this patient, which includes all pre, intra, and post visit time on the date of service.  All documented time was specific to the E/M visit and does not include any procedures that may have been performed.    ___________________________________________________________________    Subjective:  No acute events overnight, Patient states pain well controlled, Denies CP or SOB, Tolerating diet without difficulty.  Discussed with patient about importance of being on  clear liquid diet while having acute CD flare.  Patient still insisted on having regular diet. Adjusted to regular diet per patient request.    Recent Results (from the past 24 hours)   Urinalysis with Microscopy with Culture Reflex    Collection Time: 08/27/24  4:10 PM   Result Value Ref Range    Color, UA Yellow     Clarity, UA Clear     Specific Gravity, UA 1.087 (H) 1.003 - 1.030    pH, UA 6.5 5.0 - 9.0    Leukocyte Esterase, UA Negative Negative    Nitrite, UA Negative Negative    Protein, UA 30 mg/dL (A) Negative    Glucose, UA Negative Negative    Ketones, UA Negative Negative    Urobilinogen, UA <2.0 mg/dL <7.9 mg/dL    Bilirubin, UA Negative Negative    Blood, UA Negative Negative    RBC, UA 2 <=3 /HPF    WBC, UA 3 (H) <=2 /HPF    Squam Epithel, UA 4 0 - 5 /HPF    Bacteria, UA None Seen None Seen /HPF    Mucus, UA Rare (A) None Seen /HPF   Sedimentation rate, manual    Collection Time: 08/27/24  4:10 PM   Result Value Ref Range    Sed Rate 4 0 - 15 mm/h   Iron  Panel    Collection Time: 08/28/24  8:21 AM   Result Value Ref Range    Iron  47 (L) 65 - 175 ug/dL    TIBC 705 749 - 574 ug/dL    Iron  Saturation (%) 16 (L) 20 - 55 %   CBC    Collection Time: 08/28/24  8:21 AM   Result Value Ref Range    Results Verified by Slide Scan Slide Reviewed     WBC 6.1 3.6 - 11.2 10*9/L    RBC 4.54 4.26 - 5.60 10*12/L    HGB 12.9 12.9 - 16.5 g/dL    HCT 59.7 60.9 - 51.9 %    MCV 88.5 77.6 - 95.7 fL    MCH 28.3 25.9 - 32.4 pg    MCHC 32.0 32.0 - 36.0 g/dL    RDW 84.5 (H) 87.7 - 15.2 %    MPV      Platelet >154 150 - 450 10*9/L   Comprehensive Metabolic Panel    Collection Time: 08/28/24  8:21 AM   Result Value Ref Range    Sodium 142 135 - 145 mmol/L    Potassium 4.2 3.5 - 5.1 mmol/L    Chloride 111 (H) 98 - 107 mmol/L    CO2 15.0 (L) 20.0 - 31.0 mmol/L    Anion Gap 16 (H) 5 - 14 mmol/L    BUN 9 9 - 23 mg/dL    Creatinine 8.97 9.26 - 1.18 mg/dL    BUN/Creatinine Ratio 9     eGFR CKD-EPI (2021) Male >90 >=60 mL/min/1.46m2    Glucose 115 70 - 179 mg/dL    Calcium  7.9 (L) 8.7 - 10.4 mg/dL    Albumin 3.1 (L) 3.4 - 5.0 g/dL    Total Protein 6.2 5.7 - 8.2 g/dL    Total Bilirubin 0.6 0.3 - 1.2 mg/dL    AST 24 <=65 U/L    ALT 24 10 - 49 U/L    Alkaline Phosphatase 157 (H) 46 - 116 U/L     Labs/Studies:  Labs and Studies from the last 24hrs per EMR and Reviewed    Objective:  Temp:  [  36.4 ??C (97.5 ??F)-36.8 ??C (98.2 ??F)] 36.5 ??C (97.7 ??F)  Pulse:  [70-82] 78  Resp:  [16-18] 16  BP: (89-117)/(54-81) 117/81  SpO2:  [97 %-100 %] 100 %    General: Sitting in bed, thin male, alert, conversant, cooperative, and in no distress.  Eyes: No scleral icterus. Clear conjunctivae. EOMi.  ENT: Normal appearing nose. Oropharyngeal mucus membranes moist & pink.  Neck: No lymphadenopathy.  Cardiovascular: Regular rate and rhythm. No murmur. Normal JVP.  Extremities: Warm to touch. Regular 2+ radial pulses bilaterally. No LE edema.  Respiratory: Normal respiratory effort on room air Clear to auscultation bilaterally.  Gastrointestinal: Soft, non-tender, non-distended with normoactive bowel sounds.  Neurologic: Alert and fully oriented. Normal speech and language. CAM negative. Moving all extremities purposefully. Normal gait.  Skin: Skin is warm, dry and intact. No rash.  Psychiatric:  Mood and affect are normal. Speech and behavior are normal.

## 2024-08-29 LAB — BASIC METABOLIC PANEL
ANION GAP: 13 mmol/L (ref 5–14)
BLOOD UREA NITROGEN: 5 mg/dL — ABNORMAL LOW (ref 9–23)
BUN / CREAT RATIO: 5
CALCIUM: 7.5 mg/dL — ABNORMAL LOW (ref 8.7–10.4)
CHLORIDE: 113 mmol/L — ABNORMAL HIGH (ref 98–107)
CO2: 14 mmol/L — ABNORMAL LOW (ref 20.0–31.0)
CREATININE: 0.94 mg/dL (ref 0.73–1.18)
EGFR CKD-EPI (2021) MALE: >90 mL/min/{1.73_m2} (ref >=60)
GLUCOSE RANDOM: 67 mg/dL — ABNORMAL LOW (ref 70–179)
POTASSIUM: 4.2 mmol/L (ref 3.4–4.8)
SODIUM: 140 mmol/L (ref 135–145)

## 2024-08-29 LAB — MAGNESIUM: MAGNESIUM: 1.5 mg/dL — ABNORMAL LOW (ref 1.6–2.6)

## 2024-08-29 MED ORDER — OXYCODONE 5 MG TABLET
ORAL_TABLET | Freq: Two times a day (BID) | ORAL | 0 refills | 3.00000 days | Status: CP | PRN
Start: 2024-08-29 — End: 2024-09-01
  Filled 2024-08-29: qty 5, 3d supply, fill #0

## 2024-08-29 MED ORDER — PREDNISONE 10 MG TABLET
ORAL_TABLET | ORAL | 0 refills | 28.00000 days | Status: CP
Start: 2024-08-29 — End: 2024-09-26
  Filled 2024-08-29: qty 70, 28d supply, fill #0

## 2024-08-29 MED ORDER — HYDROCORTISONE 2.5 % TOPICAL CREAM
Freq: Two times a day (BID) | TOPICAL | 0 refills | 0.00000 days | Status: CP | PRN
Start: 2024-08-29 — End: 2025-08-29
  Filled 2024-08-29: qty 30, 10d supply, fill #0

## 2024-08-29 MED ADMIN — ondansetron (ZOFRAN-ODT) disintegrating tablet 4 mg: 4 mg | ORAL

## 2024-08-29 MED ADMIN — oxyCODONE (ROXICODONE) immediate release tablet 10 mg: 10 mg | ORAL | @ 08:00:00 | Stop: 2024-08-29

## 2024-08-29 MED ADMIN — oxyCODONE (ROXICODONE) immediate release tablet 10 mg: 10 mg | ORAL | @ 17:00:00 | Stop: 2024-08-29

## 2024-08-29 MED ADMIN — oxyCODONE (ROXICODONE) immediate release tablet 10 mg: 10 mg | ORAL | @ 04:00:00 | Stop: 2024-09-03

## 2024-08-29 MED ADMIN — oxyCODONE (ROXICODONE) immediate release tablet 10 mg: 10 mg | ORAL | @ 13:00:00 | Stop: 2024-08-29

## 2024-08-29 MED ADMIN — fluticasone furoate-vilanterol (BREO ELLIPTA) 100-25 mcg/dose inhaler 1 puff: 1 | RESPIRATORY_TRACT | @ 15:00:00 | Stop: 2024-08-29

## 2024-08-29 MED ADMIN — nicotine (NICODERM CQ) 21 mg/24 hr patch 1 patch: 1 | TRANSDERMAL | @ 15:00:00 | Stop: 2024-08-29

## 2024-08-29 MED ADMIN — magnesium oxide (MAG-OX) tablet 400 mg: 400 mg | ORAL | @ 19:00:00 | Stop: 2024-08-29

## 2024-08-29 MED ADMIN — magnesium oxide (MAG-OX) tablet 400 mg: 400 mg | ORAL | @ 15:00:00 | Stop: 2024-08-29

## 2024-08-29 MED ADMIN — prochlorperazine (COMPAZINE) tablet 10 mg: 10 mg | ORAL | @ 01:00:00 | Stop: 2024-08-28

## 2024-08-29 MED ADMIN — QUEtiapine (SEROQUEL) tablet 100 mg: 100 mg | ORAL | @ 01:00:00

## 2024-08-29 MED ADMIN — predniSONE (DELTASONE) tablet 40 mg: 40 mg | ORAL | @ 02:00:00 | Stop: 2024-08-28

## 2024-08-29 MED ADMIN — ferrous sulfate tablet 325 mg: 325 mg | ORAL | @ 13:00:00 | Stop: 2024-08-29

## 2024-08-29 MED ADMIN — pantoprazole (Protonix) EC tablet 40 mg: 40 mg | ORAL | @ 13:00:00 | Stop: 2024-08-29

## 2024-08-29 MED ADMIN — hydrOXYzine (ATARAX) tablet 25 mg: 25 mg | ORAL | @ 10:00:00 | Stop: 2024-08-29

## 2024-08-29 NOTE — Treatment Plan (Signed)
 [facility] Consult [facility]   Treatment Plan         Assessment and Recommendations:   Jerome Kennedy. is a 49 y.o. 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 The patient is seen in consultation at the request of Johanne Gong, FNP ([facility] for Crohn's disease.     C/f mild CD flare  H/o stricturing ileocolonic CD  Patient continues to report completely resolved symptoms, denies any diarrhea, constipation, nausea, vomiting, abdominal pain. Tolerating regular diet. There was no repeat CRP today, though expect it had normalized. At this time recommend starting PO steroid and outpatient follow up  - Please start prednisone  taper as such: 40mg  for 1 week, 30mg  1 week, 20mg  1 week, 10mg  1 week then stop   - We will arrange for outpatient GI follow up with Dr. Mack       Issues Impacting Complexity of Management:  -None    Recommendations discussed with the patient's primary team. We will sign-off at this time, please re-contact if additional questions or a new need for consultation arises.

## 2024-08-29 NOTE — Consults (Signed)
 [facility]   Care Management     Patient is a 49 y.o. admitted on 08/27/2024 for Right lower quadrant abdominal pain [R10.31]  Diarrhea, unspecified type [R19.7]. Per review of the medical record and discussion with the treating team, the patient does not meet indicators for a full assessment at this time. CM will continue to assess for discharge needs and follow up, as indicated.    Heron JAYSON Pitt, RN August 29, 2024 9:54 AM      Food Insecurity: No Food Insecurity (07/27/2024)    Hunger Vital Sign     Worried About Running Out of Food in the Last Year: Never true     Ran Out of Food in the Last Year: Never true      Financial Resource Strain: Low Risk (07/27/2024)    Overall Financial Resource Strain (CARDIA)     Difficulty of Paying Living Expenses: Not very hard     Housing: Low Risk (07/27/2024)     Transportation Needs: No Transportation Needs (07/27/2024)    PRAPARE - Therapist, Art (Medical): No     Lack of Transportation (Non-Medical): No

## 2024-08-29 NOTE — Discharge Summary (Signed)
 Physician Discharge Summary [facility]  [facility]  [address]  [place] [address]  Dept: [phone]  Loc: [phone]     Identifying Information:   Cordell Guercio  1976-05-14  [id number]    Primary Care Physician: Pcp, None Per Patient   Code Status: Full Code    Admit Date: 08/27/2024    Discharge Date: 08/29/2024     Discharge To: Home    Discharge Service: [facility] - Hospitalist Unice APP     Discharge Attending Physician: Johanne Gong, FNP    Discharge Diagnoses:  Principal Problem:    Exacerbation of Crohn's disease    (CMS-HCC) (POA: Yes)  Active Problems:    Tobacco use disorder (POA: Yes)  Resolved Problems:    * No resolved hospital problems. *      Outpatient Provider Follow Up Issues:   - Follow-up with the PCP as needed  - GI will arrange for outpatient GI follow up with Dr. Mack   - Discharged on steroid taper prednisone  taper as such: 40mg  for 1 week, 30mg  1 week, 20mg  1 week, 10mg  1 week then stop.    Hospital Course:   Jerome Al Jr. is a 49 year old male with a history of stricturing ileocolonic Crohn's disease, iron  deficiency anemia, bipolar 1 disorder, COPD, prior DVT, and tobacco use disorder, admitted for exacerbation of Crohn's disease presenting with abdominal pain, nausea, and vomiting.    Exacerbation of Crohn's Disease   He presented with right lower quadrant abdominal pain, nausea, vomiting, and non-bloody diarrhea, with CT imaging showing active inflammation of the neoterminal ileum and rectum. He had recently completed a steroid taper prior to admission. On admission, he received a single dose of IV solumedrol in the ED, after which further steroids were held per GI recommendations pending clinical response and stool studies. His symptoms resolved during hospitalization, with no further abdominal pain, diarrhea, nausea, or vomiting, and he tolerated a regular diet. A prednisone  taper was initiated prior to discharge, and outpatient GI follow-up was arranged. Serial abdominal exams were performed, and pain was managed with scheduled acetaminophen  and PRN oxycodone  and hydromorphone . Nausea was controlled with ondansetron  and prochlorperazine  as needed. Stool studies for C. difficile were negative. Electrolyte abnormalities (hypomagnesemia, hypokalemia, hypophosphatemia) were identified and repleted as needed, with stable values prior to discharge. No infectious complications or hospital-acquired injuries occurred during the stay. Discharged on steroid taper prednisone  taper as such: 40mg  for 1 week, 30mg  1 week, 20mg  1 week, 10mg  1 week then stop. GI will arrange for outpatient GI follow up with Dr. Mack.    Tobacco Use Disorder   He has a long-standing history of tobacco use (1/2 pack per day, started at age 89), and received inpatient tobacco cessation counseling during this admission. He was agreeable to nicotine  replacement therapy, and nicotine  patch and lozenges/gum were ordered during hospitalization. He reported no withdrawal symptoms and planned to continue nicotine  patch post-discharge, with additional outpatient resources provided.    Other Chronic Conditions   His iron  deficiency anemia was monitored and remained stable, with no evidence of anemia during admission; iron  supplementation was deferred until after resolution of the acute process. His bipolar disorder was managed with continuation of quetiapine . COPD was not in exacerbation and managed with PRN duonebs. DVT prophylaxis was maintained throughout the hospitalization.     Procedures:  None  No admission procedures for hospital encounter.  ______________________________________________________________________  Discharge Medications:     Your Medication List  ASK your doctor about these medications      acetaminophen  500 MG tablet  Commonly known as: TYLENOL   Take 2 tablets (1,000 mg total) by mouth every eight (8) hours.     empty container Misc  Use as directed     ferrous sulfate  325 (65 FE) MG tablet  Take 1 tablet (325 mg total) by mouth in the morning.     FLONASE  ALLERGY RELIEF 50 mcg/actuation nasal spray  Generic drug: fluticasone  propionate  2 sprays into each nostril daily as needed.     hydrocortisone  2.5 % cream  Apply topically two (2) times a day.     nicotine  14 mg/24 hr patch  Commonly known as: NICODERM CQ   Place 1 patch on the skin daily.     OPTICHAMBER DIAMOND VHC Spcr  Generic drug: inhalational spacing device     pantoprazole  40 MG tablet  Commonly known as: Protonix   Take 1 tablet (40 mg total) by mouth daily before breakfast.     QUEtiapine  100 MG tablet  Commonly known as: SEROQUEL   Take 1 tablet (100 mg total) by mouth nightly.     SYMBICORT  80-4.5 mcg/actuation inhaler  Generic drug: budesonide -formoterol   Inhale 2 puffs  in the morning.     TREMFYA  PEN 200 mg/2 mL Pnij  Generic drug: guselkumab   Inject the contents of 1 pen (200 mg) under the skin every twenty-eight (28) days.     VENTOLIN  HFA 90 mcg/actuation inhaler  Generic drug: albuterol   Inhale 2 puffs every six (6) hours as needed.              Allergies:  Morphine  and Toradol [ketorolac]  ______________________________________________________________________  Pending Test Results (if blank, then none):  Pending Labs       Order Current Status    C-reactive protein In process    Calprotectin, Stool In process            Most Recent Labs:  All lab results last 24 hours -   Recent Results (from the past 24 hours)   C. difficile Assay    Collection Time: 08/28/24 11:24 PM   Result Value Ref Range    C. difficile PCR Screen Negative Negative   Basic Metabolic Panel    Collection Time: 08/29/24  9:48 AM   Result Value Ref Range    Sodium 140 135 - 145 mmol/L    Potassium 4.2 3.4 - 4.8 mmol/L    Chloride 113 (H) 98 - 107 mmol/L    CO2 14.0 (L) 20.0 - 31.0 mmol/L    Anion Gap 13 5 - 14 mmol/L    BUN 5 (L) 9 - 23 mg/dL    Creatinine 9.05 9.26 - 1.18 mg/dL    BUN/Creatinine Ratio 5     eGFR CKD-EPI (2021) Male >90 >=60 mL/min/1.6m2    Glucose 67 (L) 70 - 179 mg/dL    Calcium  7.5 (L) 8.7 - 10.4 mg/dL   Magnesium  Level    Collection Time: 08/29/24  9:48 AM   Result Value Ref Range    Magnesium  1.5 (L) 1.6 - 2.6 mg/dL       Relevant Studies/Radiology (if blank, then none):    ______________________________________________________________________  Discharge Instructions:                   ______________________________________________________________________  Discharge Day Services:  BP 99/78  - Pulse 69  - Temp 36.7 ??C (98.1 ??F) (Oral)  - Resp 18  - Ht 175.3 cm (5'  9)  - Wt 68 kg (150 lb)  - SpO2 100%  - BMI 22.15 kg/m??   Pt seen on the day of discharge and determined appropriate for discharge.    Condition at Discharge: good    Length of Discharge: I spent greater than 30 mins in the discharge of this patient.

## 2024-08-29 NOTE — Plan of Care (Signed)
 Problem: Adult Inpatient Plan of Care  Goal: Absence of Hospital-Acquired Illness or Injury  Outcome: Resolved  Intervention: Identify and Manage Fall Risk  Recent Flowsheet Documentation  Taken 08/29/2024 1000 by Tammy Coy, RN  Safety Interventions:   environmental modification   fall reduction program maintained  Taken 08/29/2024 0830 by Tammy Coy, RN  Safety Interventions:   fall reduction program maintained   environmental modification  Intervention: Prevent Skin Injury  Recent Flowsheet Documentation  Taken 08/29/2024 0830 by Tammy Coy, RN  Positioning for Skin: Right  Skin Protection: incontinence pads utilized  Goal: Optimal Comfort and Wellbeing  Outcome: Resolved  Goal: Readiness for Transition of Care  Outcome: Resolved  Goal: Rounds/Family Conference  Outcome: Resolved     Problem: Infection  Goal: Absence of Infection Signs and Symptoms  Outcome: Resolved  Intervention: Prevent or Manage Infection  Recent Flowsheet Documentation  Taken 08/29/2024 1000 by Tammy Coy, RN  Isolation Precautions: enteric precautions maintained  Taken 08/29/2024 0830 by Tammy Coy, RN  Isolation Precautions: enteric precautions maintained     Problem: Pain Acute  Goal: Optimal Pain Control and Function  Outcome: Resolved     Problem: Nausea and Vomiting  Goal: Nausea and Vomiting Relief  Outcome: Resolved   Shift Summary  oxyCODONE  was administered PRN for persistent right lower abdominal pain, but pain score remained unchanged and clinical progression worsened.  Magnesium  oxide was administered in response to low magnesium  levels.  Fall reduction and enteric precautions were maintained, and no hospital-acquired injuries or incontinence were documented.  Care manager/social worker reviewed the case and discussed with the clinical team.  Patient remained independent in mobility and hygiene, and overall status was stable with ongoing abdominal pain and no new complications documented.    Absence of Hospital-Acquired Illness or Injury: Fall reduction program and environmental modifications were maintained throughout the shift, with no bowel incontinence and enteric precautions continued; patient remained independent in mobility and hygiene, and no hospital-acquired injuries were documented.    Optimal Comfort and Wellbeing: Comfort interventions included emotional support and pain management; patient reported ongoing right lower abdominal pain with aching quality, but pain score remained stable at 5 throughout the shift.    Rounds/Family Conference: Care manager/social worker reviewed the medical record and discussed the case with the clinical care team during the shift.    Optimal Pain Control and Function: Pain in the right lower abdomen persisted with a constant frequency and aching quality; oxyCODONE  was administered PRN, but pain score did not change and clinical progression shifted from gradual improvement to gradual worsening.    Nausea and Vomiting Relief: No documentation of nausea or vomiting was noted during the shift; bloating and abdominal pain were present.

## 2024-08-29 NOTE — Plan of Care (Signed)
 The patient was seen awake in bed while watching. He is able to verbalize his needs. He reports feeling nauseous. Vital signs taken and recorded. PRN zofran  given as needed. Monitored accordingly. Patient reports he has abdominal pain. PRN oxycodone  given as needed. Needs attended. Stool sample sent to lab for testing. Patient reports of itchiness. Referred to the provider and made new orders. Hydroxyzine  was given. Plan of care ongoing.     Shift Summary  OxyCODONE  was administered twice PRN for abdominal pain, but pain intensity increased by the end of the shift.   Ondansetron  and prochlorperazine  were given for nausea, with no further episodes documented.   Temperature remained stable and within normal limits throughout the shift.   C. difficile PCR screen was negative.   Overall, comfort and infection-related goals were supported by medication administration and stable vital signs.     Optimal Comfort and Wellbeing: Comfort interventions included regular pain assessments and administration of oxyCODONE  PRN, but abdominal pain fluctuated and increased by the end of the shift despite medication; patient declined further intervention at one point.     Absence of Infection Signs and Symptoms: Temperature remained stable and within normal limits throughout the shift, and C. difficile PCR screen was negative.     Optimal Pain Control and Function: Abdominal pain was present and varied in intensity, with oxyCODONE  administered twice PRN; pain was noted as gradually improving at one point but ultimately increased by the end of the shift.     Nausea and Vomiting Relief: Ondansetron  and prochlorperazine  were administered for nausea, with no further documentation of vomiting or nausea after these interventions.

## 2024-08-29 NOTE — Consults (Signed)
 Inpatient Tobacco Cessation Counseling Note    This medical encounter was conducted virtually using [email or web address]  TeleHealth protocols.    I have identified myself to the patient and conveyed my credentials to Mr. Gangemi  I have explained the capabilities and limitations of telemedicine and the patient/proxy and myself both agree that it is appropriate for their current circumstances/symptoms.     Contact Information  Person Contacted: Diamonte Haliburton Jr.         Contact Phone number: [phone]      Phone Outcome: Spoke with pt  Is there someone else in the room? No.     Patient's location at the time of the telephone visit: Hospitalized at [facility]   Provider's location at the time of the telephone visit: At home, in [place]       Purpose of contact:     Pt participated in a telephone visit for tobacco cessation counseling.  Patient was admitted to hospital for Right lower quadrant abdominal pain [R10.31]  Diarrhea, unspecified type [R19.7]. Patient consented to telephone visit given due to social isolation measures in place due to the COVID-19 pandemic.     Tobacco Use History and Assessment  Time Since Last Tobacco Use: 1 to 7 days ago  Tobacco Withdrawal (Past 24 Hours): None noted  Type of Tobacco Products Used: Cigarettes  Quantity Used: 10  Quantity Per: day  Time to First Use After Waking: Immediately  Other Household Members Use Tobacco: Lives alone  Age Began Use (Years Old): 15  Brand of Tobacco Used: [place]  Menthol: Yes  Medications Used in Past Attempts: Nicotine  Patch, Nicotine  Gum, Nicotine  Lozenge  Previously seen by NDP?: Hospital Inpatient    Behavioral Assessment  Why Uses: 1. habit 2. stress-relief  Reasons to Become Tobacco Free: health  Barriers/Challenges: 1. long-standing habit 2. stress 3. cravings  Strategies: 1. pt can use NRT to manage cravings 2. pt can use hand to mouth replacements 3. pt can engage in other stress-reducing activities he enjoys    NOTE: Pt reports smoking 1/2ppd and is interested in reducing his use. He is content with current nicotine  patch and denies cravings. Pt states he still has nicotine  patches at home from his last discharge and plans on using them again. SW provided encouragement and discussed the benefit of using this hospitalization as an opportunity for cessation. Pt is minimally engaged in conversation, providing brief responses to questions. SW provided education on how cessation aids in Crohn's management and overall healing. SW provided pt with contact information, physical improvements related to tobacco cessation, and available resources (including outpt Tobacco Treatment Program at [facility] and [facility] [facility]).    Treatment Plan  Please see below for medication recommendations in bold.   Cessation Meds Currently Using: Patch 21mg   Medications Recommended During Hospitalization: Patch 21mg   Outpatient/Discharge Medications Recommended: Patch 21mg   Plan to Obtain Outpatient Meds: Pt already has medication  Patient's Plan Post Discharge/Visit: Reduce tobacco use      As part of this Telephone Visit, no in-person exam was conducted.     I personally spent 11 minutes counseling the patient via telephone about tobacco cessation.  I spent an additional 10 minutes on pre- and post-visit activities.      The patient was physically located in [place]  or a state in which I am permitted to provide care. The patient and/or parent/guardian understood that s/he may incur co-pays and cost sharing, and agreed to the telemedicine visit. The visit  was reasonable and appropriate under the circumstances given the patient's presentation at the time.     The patient and/or parent/guardian has been advised of the potential risks and limitations of this mode of treatment (including, but not limited to, the absence of in-person examination) and has agreed to be treated using telemedicine. The patient's/patient's family's questions regarding telemedicine have been answered.      If the visit was completed in an ambulatory setting, the patient and/or parent/guardian has also been advised to contact their provider???s office for worsening conditions, and seek emergency medical treatment and/or call 911 if the patient deems either necessary.     Visit Format/Coding: Telephone     Coding: 01032 (11-20 minutes)  Service rendered over the phone most consistent with: Tobacco cessation counseling, greater than 10 minutes (00592)     Christain Crock, LCSW, LCAS, NCTTP  Clinical Social Worker / Tobacco Treatment Specialist  Tobacco Treatment Program  [facility]  [phone]

## 2024-08-30 MED ORDER — NICOTINE 21 MG/24 HR DAILY TRANSDERMAL PATCH
MEDICATED_PATCH | Freq: Every day | TRANSDERMAL | 0 refills | 28.00000 days | Status: CP
Start: 2024-08-30 — End: ?
  Filled 2024-08-29: qty 28, 28d supply, fill #0
